# Patient Record
Sex: Male | Born: 1985
Health system: Southern US, Community
[De-identification: ages and names within clinical notes are randomized; demographics above are authoritative.]

## PROBLEM LIST (undated history)

## (undated) DIAGNOSIS — I442 Atrioventricular block, complete: Secondary | ICD-10-CM

## (undated) DIAGNOSIS — Q211 Atrial septal defect: Secondary | ICD-10-CM

## (undated) DIAGNOSIS — Q210 Ventricular septal defect: Secondary | ICD-10-CM

## (undated) DIAGNOSIS — D702 Other drug-induced agranulocytosis: Secondary | ICD-10-CM

## (undated) DIAGNOSIS — I358 Other nonrheumatic aortic valve disorders: Secondary | ICD-10-CM

## (undated) DIAGNOSIS — N179 Acute kidney failure, unspecified: Secondary | ICD-10-CM

## (undated) DIAGNOSIS — Z9889 Other specified postprocedural states: Secondary | ICD-10-CM

## (undated) DIAGNOSIS — I330 Acute and subacute infective endocarditis: Secondary | ICD-10-CM

## (undated) DIAGNOSIS — I420 Dilated cardiomyopathy: Secondary | ICD-10-CM

## (undated) DIAGNOSIS — Z8774 Personal history of (corrected) congenital malformations of heart and circulatory system: Secondary | ICD-10-CM

## (undated) DIAGNOSIS — G060 Intracranial abscess and granuloma: Secondary | ICD-10-CM

## (undated) DIAGNOSIS — Z950 Presence of cardiac pacemaker: Secondary | ICD-10-CM

## (undated) DIAGNOSIS — I351 Nonrheumatic aortic (valve) insufficiency: Secondary | ICD-10-CM

## (undated) DIAGNOSIS — Q2112 Patent foramen ovale: Secondary | ICD-10-CM

## (undated) DIAGNOSIS — I38 Endocarditis, valve unspecified: Secondary | ICD-10-CM

## (undated) DIAGNOSIS — T826XXA Infection and inflammatory reaction due to cardiac valve prosthesis, initial encounter: Secondary | ICD-10-CM

## (undated) DIAGNOSIS — G039 Meningitis, unspecified: Secondary | ICD-10-CM

## (undated) DIAGNOSIS — E669 Obesity, unspecified: Secondary | ICD-10-CM

## (undated) DIAGNOSIS — Z954 Presence of other heart-valve replacement: Secondary | ICD-10-CM

## (undated) DIAGNOSIS — S7292XA Unspecified fracture of left femur, initial encounter for closed fracture: Secondary | ICD-10-CM

## (undated) DIAGNOSIS — D696 Thrombocytopenia, unspecified: Secondary | ICD-10-CM

## (undated) DIAGNOSIS — Z720 Tobacco use: Secondary | ICD-10-CM

## (undated) DIAGNOSIS — I071 Rheumatic tricuspid insufficiency: Secondary | ICD-10-CM

## (undated) HISTORY — DX: Other nonrheumatic aortic valve disorders: I35.8

## (undated) HISTORY — DX: Endocarditis, valve unspecified: I38

## (undated) HISTORY — DX: Dilated cardiomyopathy: I42.0

## (undated) HISTORY — DX: Acute and subacute infective endocarditis: I33.0

## (undated) HISTORY — DX: Nonrheumatic aortic (valve) insufficiency: I35.1

## (undated) HISTORY — DX: Intracranial abscess and granuloma: G06.0

## (undated) HISTORY — DX: Atrioventricular block, complete: I44.2

## (undated) HISTORY — DX: Other drug-induced agranulocytosis: D70.2

## (undated) HISTORY — DX: Infection and inflammatory reaction due to cardiac valve prosthesis, initial encounter: T82.6XXA

---
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
## 2008-01-15 ENCOUNTER — Emergency Department (HOSPITAL_COMMUNITY): Admission: EM | Admit: 2008-01-15 | Discharge: 2008-01-15 | Payer: Self-pay | Admitting: Emergency Medicine

## 2013-12-13 DIAGNOSIS — D696 Thrombocytopenia, unspecified: Secondary | ICD-10-CM

## 2013-12-13 HISTORY — DX: Thrombocytopenia, unspecified: D69.6

## 2013-12-23 ENCOUNTER — Encounter (HOSPITAL_BASED_OUTPATIENT_CLINIC_OR_DEPARTMENT_OTHER): Payer: Self-pay

## 2013-12-23 ENCOUNTER — Inpatient Hospital Stay (HOSPITAL_BASED_OUTPATIENT_CLINIC_OR_DEPARTMENT_OTHER)
Admission: EM | Admit: 2013-12-23 | Discharge: 2014-01-07 | DRG: 853 | Disposition: A | Discharge status: Home Health | Payer: BC Managed Care – PPO | Attending: Cardiothoracic Surgery | Admitting: Cardiothoracic Surgery

## 2013-12-23 ENCOUNTER — Emergency Department (HOSPITAL_BASED_OUTPATIENT_CLINIC_OR_DEPARTMENT_OTHER): Payer: BC Managed Care – PPO

## 2013-12-23 DIAGNOSIS — R0600 Dyspnea, unspecified: Secondary | ICD-10-CM | POA: Diagnosis not present

## 2013-12-23 DIAGNOSIS — F40240 Claustrophobia: Secondary | ICD-10-CM | POA: Diagnosis present

## 2013-12-23 DIAGNOSIS — J9601 Acute respiratory failure with hypoxia: Secondary | ICD-10-CM | POA: Diagnosis not present

## 2013-12-23 DIAGNOSIS — Z4659 Encounter for fitting and adjustment of other gastrointestinal appliance and device: Secondary | ICD-10-CM

## 2013-12-23 DIAGNOSIS — R21 Rash and other nonspecific skin eruption: Secondary | ICD-10-CM | POA: Diagnosis present

## 2013-12-23 DIAGNOSIS — Q211 Atrial septal defect: Secondary | ICD-10-CM | POA: Diagnosis not present

## 2013-12-23 DIAGNOSIS — J189 Pneumonia, unspecified organism: Secondary | ICD-10-CM

## 2013-12-23 DIAGNOSIS — I440 Atrioventricular block, first degree: Secondary | ICD-10-CM | POA: Diagnosis present

## 2013-12-23 DIAGNOSIS — I351 Nonrheumatic aortic (valve) insufficiency: Secondary | ICD-10-CM | POA: Diagnosis present

## 2013-12-23 DIAGNOSIS — D62 Acute posthemorrhagic anemia: Secondary | ICD-10-CM | POA: Diagnosis not present

## 2013-12-23 DIAGNOSIS — G039 Meningitis, unspecified: Secondary | ICD-10-CM

## 2013-12-23 DIAGNOSIS — A419 Sepsis, unspecified organism: Secondary | ICD-10-CM

## 2013-12-23 DIAGNOSIS — R51 Headache: Secondary | ICD-10-CM

## 2013-12-23 DIAGNOSIS — R451 Restlessness and agitation: Secondary | ICD-10-CM | POA: Diagnosis not present

## 2013-12-23 DIAGNOSIS — E669 Obesity, unspecified: Secondary | ICD-10-CM | POA: Diagnosis present

## 2013-12-23 DIAGNOSIS — M009 Pyogenic arthritis, unspecified: Secondary | ICD-10-CM

## 2013-12-23 DIAGNOSIS — R740 Nonspecific elevation of levels of transaminase and lactic acid dehydrogenase [LDH]: Secondary | ICD-10-CM | POA: Diagnosis present

## 2013-12-23 DIAGNOSIS — Z7982 Long term (current) use of aspirin: Secondary | ICD-10-CM

## 2013-12-23 DIAGNOSIS — F17290 Nicotine dependence, other tobacco product, uncomplicated: Secondary | ICD-10-CM | POA: Diagnosis present

## 2013-12-23 DIAGNOSIS — A401 Sepsis due to streptococcus, group B: Principal | ICD-10-CM

## 2013-12-23 DIAGNOSIS — Z950 Presence of cardiac pacemaker: Secondary | ICD-10-CM

## 2013-12-23 DIAGNOSIS — R0602 Shortness of breath: Secondary | ICD-10-CM

## 2013-12-23 DIAGNOSIS — Z452 Encounter for adjustment and management of vascular access device: Secondary | ICD-10-CM

## 2013-12-23 DIAGNOSIS — R945 Abnormal results of liver function studies: Secondary | ICD-10-CM

## 2013-12-23 DIAGNOSIS — Z9889 Other specified postprocedural states: Secondary | ICD-10-CM

## 2013-12-23 DIAGNOSIS — J9690 Respiratory failure, unspecified, unspecified whether with hypoxia or hypercapnia: Secondary | ICD-10-CM

## 2013-12-23 DIAGNOSIS — D696 Thrombocytopenia, unspecified: Secondary | ICD-10-CM | POA: Diagnosis present

## 2013-12-23 DIAGNOSIS — N170 Acute kidney failure with tubular necrosis: Secondary | ICD-10-CM | POA: Diagnosis present

## 2013-12-23 DIAGNOSIS — I5023 Acute on chronic systolic (congestive) heart failure: Secondary | ICD-10-CM | POA: Diagnosis present

## 2013-12-23 DIAGNOSIS — I358 Other nonrheumatic aortic valve disorders: Secondary | ICD-10-CM | POA: Diagnosis present

## 2013-12-23 DIAGNOSIS — R001 Bradycardia, unspecified: Secondary | ICD-10-CM | POA: Diagnosis not present

## 2013-12-23 DIAGNOSIS — R739 Hyperglycemia, unspecified: Secondary | ICD-10-CM | POA: Diagnosis not present

## 2013-12-23 DIAGNOSIS — N179 Acute kidney failure, unspecified: Secondary | ICD-10-CM

## 2013-12-23 DIAGNOSIS — D65 Disseminated intravascular coagulation [defibrination syndrome]: Secondary | ICD-10-CM | POA: Diagnosis present

## 2013-12-23 DIAGNOSIS — G002 Streptococcal meningitis: Secondary | ICD-10-CM | POA: Diagnosis present

## 2013-12-23 DIAGNOSIS — Z6831 Body mass index (BMI) 31.0-31.9, adult: Secondary | ICD-10-CM

## 2013-12-23 DIAGNOSIS — E860 Dehydration: Secondary | ICD-10-CM

## 2013-12-23 DIAGNOSIS — R7989 Other specified abnormal findings of blood chemistry: Secondary | ICD-10-CM

## 2013-12-23 DIAGNOSIS — G060 Intracranial abscess and granuloma: Secondary | ICD-10-CM | POA: Diagnosis present

## 2013-12-23 DIAGNOSIS — I082 Rheumatic disorders of both aortic and tricuspid valves: Secondary | ICD-10-CM | POA: Diagnosis present

## 2013-12-23 DIAGNOSIS — R519 Headache, unspecified: Secondary | ICD-10-CM

## 2013-12-23 DIAGNOSIS — I471 Supraventricular tachycardia: Secondary | ICD-10-CM | POA: Diagnosis not present

## 2013-12-23 DIAGNOSIS — Q231 Congenital insufficiency of aortic valve: Secondary | ICD-10-CM

## 2013-12-23 DIAGNOSIS — I39 Endocarditis and heart valve disorders in diseases classified elsewhere: Secondary | ICD-10-CM

## 2013-12-23 DIAGNOSIS — I330 Acute and subacute infective endocarditis: Secondary | ICD-10-CM | POA: Diagnosis present

## 2013-12-23 DIAGNOSIS — M25569 Pain in unspecified knee: Secondary | ICD-10-CM

## 2013-12-23 DIAGNOSIS — I509 Heart failure, unspecified: Secondary | ICD-10-CM

## 2013-12-23 DIAGNOSIS — I442 Atrioventricular block, complete: Secondary | ICD-10-CM

## 2013-12-23 DIAGNOSIS — J9600 Acute respiratory failure, unspecified whether with hypoxia or hypercapnia: Secondary | ICD-10-CM

## 2013-12-23 DIAGNOSIS — R6521 Severe sepsis with septic shock: Secondary | ICD-10-CM | POA: Diagnosis present

## 2013-12-23 DIAGNOSIS — R0902 Hypoxemia: Secondary | ICD-10-CM

## 2013-12-23 DIAGNOSIS — Z952 Presence of prosthetic heart valve: Secondary | ICD-10-CM

## 2013-12-23 DIAGNOSIS — D7289 Other specified disorders of white blood cells: Secondary | ICD-10-CM | POA: Diagnosis not present

## 2013-12-23 DIAGNOSIS — R600 Localized edema: Secondary | ICD-10-CM

## 2013-12-23 DIAGNOSIS — R0682 Tachypnea, not elsewhere classified: Secondary | ICD-10-CM

## 2013-12-23 DIAGNOSIS — I77810 Thoracic aortic ectasia: Secondary | ICD-10-CM

## 2013-12-23 DIAGNOSIS — Z9289 Personal history of other medical treatment: Secondary | ICD-10-CM

## 2013-12-23 DIAGNOSIS — I339 Acute and subacute endocarditis, unspecified: Secondary | ICD-10-CM

## 2013-12-23 DIAGNOSIS — B951 Streptococcus, group B, as the cause of diseases classified elsewhere: Secondary | ICD-10-CM | POA: Diagnosis not present

## 2013-12-23 DIAGNOSIS — G009 Bacterial meningitis, unspecified: Secondary | ICD-10-CM | POA: Diagnosis present

## 2013-12-23 HISTORY — DX: Other specified postprocedural states: Z98.890

## 2013-12-23 HISTORY — DX: Unspecified fracture of left femur, initial encounter for closed fracture: S72.92XA

## 2013-12-23 HISTORY — DX: Atrial septal defect: Q21.1

## 2013-12-23 HISTORY — DX: Endocarditis, valve unspecified: I38

## 2013-12-23 HISTORY — DX: Meningitis, unspecified: G03.9

## 2013-12-23 HISTORY — DX: Obesity, unspecified: E66.9

## 2013-12-23 HISTORY — DX: Patent foramen ovale: Q21.12

## 2013-12-23 HISTORY — DX: Tobacco use: Z72.0

## 2013-12-23 LAB — CBC WITH DIFFERENTIAL/PLATELET
BAND NEUTROPHILS: 12 % — AB (ref 0–10)
BASOS ABS: 0 10*3/uL (ref 0.0–0.1)
Basophils Relative: 0 % (ref 0–1)
EOS PCT: 0 % (ref 0–5)
Eosinophils Absolute: 0 10*3/uL (ref 0.0–0.7)
HCT: 46.3 % (ref 39.0–52.0)
HEMOGLOBIN: 16.4 g/dL (ref 13.0–17.0)
LYMPHS PCT: 3 % — AB (ref 12–46)
Lymphs Abs: 0.3 10*3/uL — ABNORMAL LOW (ref 0.7–4.0)
MCH: 29.5 pg (ref 26.0–34.0)
MCHC: 35.4 g/dL (ref 30.0–36.0)
MCV: 83.4 fL (ref 78.0–100.0)
MONOS PCT: 2 % — AB (ref 3–12)
Monocytes Absolute: 0.2 10*3/uL (ref 0.1–1.0)
NEUTROS ABS: 9.8 10*3/uL — AB (ref 1.7–7.7)
Neutrophils Relative %: 83 % — ABNORMAL HIGH (ref 43–77)
Platelets: 9 10*3/uL — CL (ref 150–400)
RBC: 5.55 MIL/uL (ref 4.22–5.81)
RDW: 12.9 % (ref 11.5–15.5)
Smear Review: DECREASED
WBC: 10.3 10*3/uL (ref 4.0–10.5)

## 2013-12-23 LAB — CSF CELL COUNT WITH DIFFERENTIAL
EOS CSF: 0 % (ref 0–1)
Eosinophils, CSF: 0 % (ref 0–1)
LYMPHS CSF: 10 % — AB (ref 40–80)
Lymphs, CSF: 4 % — ABNORMAL LOW (ref 40–80)
MONOCYTE-MACROPHAGE-SPINAL FLUID: 8 % — AB (ref 15–45)
Monocyte-Macrophage-Spinal Fluid: 3 % — ABNORMAL LOW (ref 15–45)
OTHER CELLS CSF: 0
Other Cells, CSF: 0
RBC COUNT CSF: 245 /mm3 — AB
RBC COUNT CSF: 250 /mm3 — AB
Segmented Neutrophils-CSF: 82 % — ABNORMAL HIGH (ref 0–6)
Segmented Neutrophils-CSF: 93 % — ABNORMAL HIGH (ref 0–6)
Tube #: 1
Tube #: 4
WBC CSF: 2230 /mm3 — AB (ref 0–5)
WBC CSF: 2750 /mm3 — AB (ref 0–5)

## 2013-12-23 LAB — BASIC METABOLIC PANEL
ANION GAP: 14 (ref 5–15)
BUN: 34 mg/dL — ABNORMAL HIGH (ref 6–23)
CHLORIDE: 103 meq/L (ref 96–112)
CO2: 23 mEq/L (ref 19–32)
Calcium: 7.9 mg/dL — ABNORMAL LOW (ref 8.4–10.5)
Creatinine, Ser: 1.6 mg/dL — ABNORMAL HIGH (ref 0.50–1.35)
GFR calc Af Amer: 67 mL/min — ABNORMAL LOW (ref >90)
GFR calc non Af Amer: 58 mL/min — ABNORMAL LOW (ref >90)
Glucose, Bld: 151 mg/dL — ABNORMAL HIGH (ref 70–99)
POTASSIUM: 3.7 meq/L (ref 3.7–5.3)
Sodium: 140 mEq/L (ref 137–147)

## 2013-12-23 LAB — COMPREHENSIVE METABOLIC PANEL
ALK PHOS: 193 U/L — AB (ref 39–117)
ALT: 49 U/L (ref 0–53)
AST: 52 U/L — AB (ref 0–37)
Albumin: 3 g/dL — ABNORMAL LOW (ref 3.5–5.2)
Anion gap: 19 — ABNORMAL HIGH (ref 5–15)
BUN: 33 mg/dL — AB (ref 6–23)
CALCIUM: 9 mg/dL (ref 8.4–10.5)
CO2: 22 meq/L (ref 19–32)
CREATININE: 1.9 mg/dL — AB (ref 0.50–1.35)
Chloride: 100 mEq/L (ref 96–112)
GFR, EST AFRICAN AMERICAN: 55 mL/min — AB (ref >90)
GFR, EST NON AFRICAN AMERICAN: 47 mL/min — AB (ref >90)
GLUCOSE: 189 mg/dL — AB (ref 70–99)
POTASSIUM: 3.7 meq/L (ref 3.7–5.3)
Sodium: 141 mEq/L (ref 137–147)
TOTAL PROTEIN: 6.2 g/dL (ref 6.0–8.3)
Total Bilirubin: 6.3 mg/dL — ABNORMAL HIGH (ref 0.3–1.2)

## 2013-12-23 LAB — CK TOTAL AND CKMB (NOT AT ARMC)
CK, MB: <1 ng/mL (ref 0.3–4.0)
Total CK: 66 U/L (ref 7–232)

## 2013-12-23 LAB — TROPONIN I
Troponin I: <0.3 ng/mL (ref <0.30)
Troponin I: <0.3 ng/mL (ref <0.30)

## 2013-12-23 LAB — PROTEIN AND GLUCOSE, CSF
Glucose, CSF: 34 mg/dL — ABNORMAL LOW (ref 43–76)
Total  Protein, CSF: 164 mg/dL — ABNORMAL HIGH (ref 15–45)

## 2013-12-23 LAB — MRSA PCR SCREENING: MRSA by PCR: NEGATIVE

## 2013-12-23 MED ORDER — VANCOMYCIN HCL 10 G IV SOLR
1500.0000 mg | INTRAVENOUS | Status: AC
  Administered 2013-12-23: 1500 mg via INTRAVENOUS
  Filled 2013-12-23: qty 1500

## 2013-12-23 MED ORDER — METOCLOPRAMIDE HCL 5 MG/ML IJ SOLN
10.0000 mg | Freq: Once | INTRAMUSCULAR | Status: AC
  Administered 2013-12-23: 10 mg via INTRAVENOUS
  Filled 2013-12-23: qty 2

## 2013-12-23 MED ORDER — DIPHENHYDRAMINE HCL 50 MG/ML IJ SOLN
25.0000 mg | Freq: Once | INTRAMUSCULAR | Status: AC
  Administered 2013-12-23: 25 mg via INTRAVENOUS
  Filled 2013-12-23: qty 1

## 2013-12-23 MED ORDER — DEXAMETHASONE SODIUM PHOSPHATE 10 MG/ML IJ SOLN
10.0000 mg | Freq: Four times a day (QID) | INTRAMUSCULAR | Status: DC
  Administered 2013-12-24 (×2): 10 mg via INTRAVENOUS
  Filled 2013-12-23 (×9): qty 1

## 2013-12-23 MED ORDER — ACETAMINOPHEN 325 MG PO TABS
650.0000 mg | ORAL_TABLET | Freq: Four times a day (QID) | ORAL | Status: DC | PRN
  Administered 2013-12-26 – 2013-12-27 (×3): 650 mg via ORAL
  Filled 2013-12-23 (×4): qty 2

## 2013-12-23 MED ORDER — PROMETHAZINE HCL 25 MG/ML IJ SOLN
25.0000 mg | Freq: Once | INTRAMUSCULAR | Status: AC
  Administered 2013-12-23: 25 mg via INTRAVENOUS
  Filled 2013-12-23: qty 1

## 2013-12-23 MED ORDER — ONDANSETRON HCL 4 MG/2ML IJ SOLN
4.0000 mg | Freq: Once | INTRAMUSCULAR | Status: AC
  Administered 2013-12-23: 4 mg via INTRAVENOUS
  Filled 2013-12-23: qty 2

## 2013-12-23 MED ORDER — KETOROLAC TROMETHAMINE 30 MG/ML IJ SOLN
30.0000 mg | Freq: Once | INTRAMUSCULAR | Status: AC
Start: 2013-12-23 — End: 2013-12-23
  Administered 2013-12-23: 30 mg via INTRAVENOUS
  Filled 2013-12-23: qty 1

## 2013-12-23 MED ORDER — SODIUM CHLORIDE 0.9 % IV BOLUS (SEPSIS)
1000.0000 mL | Freq: Once | INTRAVENOUS | Status: AC
  Administered 2013-12-23: 1000 mL via INTRAVENOUS

## 2013-12-23 MED ORDER — CEFTRIAXONE SODIUM 2 G IJ SOLR
INTRAMUSCULAR | Status: AC
  Filled 2013-12-23: qty 2

## 2013-12-23 MED ORDER — FENTANYL CITRATE 0.05 MG/ML IJ SOLN
50.0000 ug | Freq: Once | INTRAMUSCULAR | Status: AC
  Administered 2013-12-23: 50 ug via INTRAVENOUS
  Filled 2013-12-23: qty 2

## 2013-12-23 MED ORDER — ENOXAPARIN SODIUM 40 MG/0.4ML ~~LOC~~ SOLN: 40.0000 mg | SUBCUTANEOUS | Status: DC

## 2013-12-23 MED ORDER — MAGNESIUM SULFATE 2 GM/50ML IV SOLN
2.0000 g | Freq: Once | INTRAVENOUS | Status: AC
  Administered 2013-12-23: 2 g via INTRAVENOUS
  Filled 2013-12-23: qty 50

## 2013-12-23 MED ORDER — CEFTRIAXONE SODIUM IN DEXTROSE 40 MG/ML IV SOLN
2.0000 g | Freq: Two times a day (BID) | INTRAVENOUS | Status: DC
  Administered 2013-12-24 – 2013-12-29 (×11): 2 g via INTRAVENOUS
  Filled 2013-12-23 (×12): qty 50

## 2013-12-23 MED ORDER — ONDANSETRON HCL 4 MG/2ML IJ SOLN
4.0000 mg | Freq: Four times a day (QID) | INTRAMUSCULAR | Status: DC | PRN
  Administered 2013-12-26 – 2013-12-28 (×3): 4 mg via INTRAVENOUS
  Filled 2013-12-23 (×5): qty 2

## 2013-12-23 MED ORDER — DEXTROSE 5 % IV SOLN
10.0000 mg/kg | Freq: Three times a day (TID) | INTRAVENOUS | Status: DC
  Administered 2013-12-23 – 2013-12-24 (×2): 615 mg via INTRAVENOUS
  Filled 2013-12-23 (×4): qty 12.3

## 2013-12-23 MED ORDER — SODIUM CHLORIDE 0.9 % IV SOLN: Freq: Once | INTRAVENOUS | Status: DC

## 2013-12-23 MED ORDER — ONDANSETRON HCL 4 MG PO TABS: 4.0000 mg | ORAL_TABLET | Freq: Four times a day (QID) | ORAL | Status: DC | PRN

## 2013-12-23 MED ORDER — SODIUM CHLORIDE 0.9 % IV SOLN
INTRAVENOUS | Status: DC
  Administered 2013-12-24 – 2013-12-25 (×3): via INTRAVENOUS
  Administered 2013-12-27: 1000 mL via INTRAVENOUS
  Administered 2013-12-27 – 2013-12-28 (×2): via INTRAVENOUS
  Administered 2013-12-29: 1000 mL via INTRAVENOUS

## 2013-12-23 MED ORDER — DEXTROSE 5 % IV SOLN
2.0000 g | Freq: Once | INTRAVENOUS | Status: AC
  Administered 2013-12-23: 2 g via INTRAVENOUS

## 2013-12-23 MED ORDER — VANCOMYCIN HCL 10 G IV SOLR
1250.0000 mg | Freq: Two times a day (BID) | INTRAVENOUS | Status: DC
  Administered 2013-12-24: 1250 mg via INTRAVENOUS
  Filled 2013-12-23 (×2): qty 1250

## 2013-12-23 MED ORDER — ZOLPIDEM TARTRATE 5 MG PO TABS: 5.0000 mg | ORAL_TABLET | Freq: Once | ORAL | Status: DC

## 2013-12-23 MED ORDER — LIDOCAINE HCL 2 % IJ SOLN
10.0000 mL | Freq: Once | INTRAMUSCULAR | Status: AC
  Administered 2013-12-23: 200 mg
  Filled 2013-12-23: qty 20

## 2013-12-23 MED ORDER — ENOXAPARIN SODIUM 40 MG/0.4ML ~~LOC~~ SOLN
40.0000 mg | SUBCUTANEOUS | Status: DC
  Filled 2013-12-23: qty 0.4

## 2013-12-23 MED ORDER — SODIUM CHLORIDE 0.9 % IV SOLN
Freq: Once | INTRAVENOUS | Status: AC
  Administered 2013-12-23: 14:00:00 via INTRAVENOUS

## 2013-12-23 MED ORDER — ZOLPIDEM TARTRATE 5 MG PO TABS
5.0000 mg | ORAL_TABLET | Freq: Once | ORAL | Status: AC
  Administered 2013-12-23: 5 mg via ORAL
  Filled 2013-12-23: qty 1

## 2013-12-23 MED ORDER — LEVALBUTEROL HCL 0.63 MG/3ML IN NEBU: 0.6300 mg | INHALATION_SOLUTION | Freq: Four times a day (QID) | RESPIRATORY_TRACT | Status: DC | PRN

## 2013-12-23 MED ORDER — ACETAMINOPHEN 650 MG RE SUPP: 650.0000 mg | Freq: Four times a day (QID) | RECTAL | Status: DC | PRN

## 2013-12-23 NOTE — ED Notes (Signed)
Attempted to call report; RN unable to take report at this time.

## 2013-12-23 NOTE — Progress Notes (Signed)
PENDING ACCEPTANCE TRANFER NOTE:  Call received from:    Dr. Anitra Lauth  REASON FOR REQUESTING TRANSFER:    Severe thrombocytopenia and acute renal failure.  HPI:   28 year old healthy male was seen recently in urgent care for flu-like symptoms, came back with nausea/vomiting.he was seen by his primary care physician and he mentioned neck stiffness, headache and subjective fever so he was sent back to the ED for further evaluation. Had LP done, results pending, his labs came back with creatinine of 1.9 and platelets of 9. Accepted stepdown, because of neck stiffness started on high dose of Rocephin.  PLAN:  According to telephone report, this patient was accepted for transfer to [facility],   Under TRH team:  MCadmits,  I have requested an order be written to call Flow Manager at [phone] upon patient arrival to the floor for final physician assignment who will do the admission and give admitting orders.  SIGNED: Clint Lipps, MD [facility]  12/23/2013, 12:54 PM

## 2013-12-23 NOTE — ED Provider Notes (Addendum)
CSN: [id number]     Arrival date & time 12/23/13  1102 History   First MD Initiated Contact with Patient 12/23/13 1117     Chief Complaint  Patient presents with  . Emesis     (Consider location/radiation/quality/duration/timing/severity/associated sxs/prior Treatment) Patient is a 28 y.o. male presenting with vomiting. The history is provided by the patient and a significant other.  Emesis Severity:  Severe Duration:  5 days Timing:  Constant Number of daily episodes:  Numerous Quality:  Stomach contents Progression:  Unchanged Chronicity:  New Recent urination:  Decreased Relieved by:  Nothing Worsened by:  Food smell and liquids Associated symptoms: chills, fever and headaches   Associated symptoms: no abdominal pain, no cough, no diarrhea, no myalgias and no URI   Associated symptoms comment:  Stared with headache over the temples on sat with vomiting and was seen at urgent care on Sunday and dx with the flu.  Sx worsened and he was unable to hold anything down so went to the ER on Monday and dx with elevated LFT's and thrombocytopenia.  He had neg CT of abd and d/ced home.  Today went to PCP and sent here for further eval Risk factors: no alcohol use and no sick contacts     History reviewed. No pertinent past medical history. History reviewed. No pertinent past surgical history. No family history on file. History  Substance Use Topics  . Smoking status: Never Smoker   . Smokeless tobacco: Current User  . Alcohol Use: Yes    Review of Systems  Constitutional: Positive for chills.  Gastrointestinal: Positive for vomiting. Negative for abdominal pain and diarrhea.  Musculoskeletal: Negative for myalgias.  Neurological: Positive for headaches.  All other systems reviewed and are negative.     Allergies  Review of patient's allergies indicates no known allergies.  Home Medications   Prior to Admission medications   Not on File   BP 108/50 mmHg  Pulse 135   Temp(Src) 97.5 F (36.4 C) (Oral)  Resp 20  Ht 5\' 5"  (1.651 m)  Wt 200 lb (90.719 kg)  BMI 33.28 kg/m2  SpO2 96% Physical Exam  Constitutional: He is oriented to person, place, and time. He appears well-developed and well-nourished. No distress.  HENT:  Head: Normocephalic and atraumatic.  Mouth/Throat: Oropharynx is clear and moist. Mucous membranes are dry.  Eyes: Conjunctivae and EOM are normal. Pupils are equal, round, and reactive to light.  photophobia  Neck: Spinous process tenderness and muscular tenderness present. Decreased range of motion present.  Pain with attempting to flex the neck  Cardiovascular: Regular rhythm and intact distal pulses.  Tachycardia present.   No murmur heard. Pulmonary/Chest: Effort normal and breath sounds normal. No respiratory distress. He has no wheezes. He has no rales.  Abdominal: Soft. He exhibits no distension. There is no tenderness. There is no rebound and no guarding.  Musculoskeletal: He exhibits tenderness. He exhibits no edema.  Small circular area that is ecchymotic on the right calf that is tender to touch.  Does not feel like a discrete nodule at this time and not firm.  Neurological: He is alert and oriented to person, place, and time.  Skin: Skin is warm and dry. No rash noted. No erythema.  Psychiatric: He has a normal mood and affect. His behavior is normal.  Nursing note and vitals reviewed.   ED Course  LUMBAR PUNCTURE Date/Time: 12/23/2013 12:56 PM Performed by: Gwyneth Sprout Authorized by: Gwyneth Sprout Consent: Verbal consent obtained. Written  consent obtained. Risks and benefits: risks, benefits and alternatives were discussed Consent given by: patient and parent Patient understanding: patient states understanding of the procedure being performed Patient consent: the patient's understanding of the procedure matches consent given Procedure consent: procedure consent matches procedure scheduled Relevant  documents: relevant documents present and verified Site marked: the operative site was marked Imaging studies: imaging studies available Patient identity confirmed: verbally with patient and arm band Time out: Immediately prior to procedure a "time out" was called to verify the correct patient, procedure, equipment, support staff and site/side marked as required. Indications: evaluation for infection Anesthesia: local infiltration Local anesthetic: lidocaine 2% without epinephrine Anesthetic total: 5 ml Patient sedated: no Preparation: Patient was prepped and draped in the usual sterile fashion. Lumbar space: L4-L5 interspace Patient's position: sitting Needle gauge: 20 Needle type: diamond point Number of attempts: 1 Fluid appearance: cloudy Tubes of fluid: 4 Total volume: 4 ml Post-procedure: site cleaned and adhesive bandage applied Patient tolerance: Patient tolerated the procedure well with no immediate complications   (including critical care time) Labs Review Labs Reviewed  CBC WITH DIFFERENTIAL - Abnormal; Notable for the following:    Platelets 9 (*)    Neutrophils Relative % 83 (*)    Lymphocytes Relative 3 (*)    Monocytes Relative 2 (*)    Band Neutrophils 12 (*)    Neutro Abs 9.8 (*)    Lymphs Abs 0.3 (*)    All other components within normal limits  COMPREHENSIVE METABOLIC PANEL - Abnormal; Notable for the following:    Glucose, Bld 189 (*)    BUN 33 (*)    Creatinine, Ser 1.90 (*)    Albumin 3.0 (*)    AST 52 (*)    Alkaline Phosphatase 193 (*)    Total Bilirubin 6.3 (*)    GFR calc non Af Amer 47 (*)    GFR calc Af Amer 55 (*)    Anion gap 19 (*)    All other components within normal limits  CSF CELL COUNT WITH DIFFERENTIAL - Abnormal; Notable for the following:    Color, CSF STRAW (*)    Appearance, CSF CLOUDY (*)    RBC Count, CSF 250 (*)    WBC, CSF 2230 (*)    All other components within normal limits  CSF CELL COUNT WITH DIFFERENTIAL -  Abnormal; Notable for the following:    Color, CSF STRAW (*)    Appearance, CSF CLOUDY (*)    RBC Count, CSF 245 (*)    WBC, CSF 2750 (*)    All other components within normal limits  PROTEIN AND GLUCOSE, CSF - Abnormal; Notable for the following:    Glucose, CSF 34 (*)    Total  Protein, CSF 164 (*)    All other components within normal limits  CSF CULTURE  PATHOLOGIST SMEAR REVIEW  BASIC METABOLIC PANEL    Imaging Review Ct Head Wo Contrast  12/23/2013   CLINICAL DATA:  5 day history of bilateral temporal region headaches with neck stiffness and vomiting  EXAM: CT HEAD WITHOUT CONTRAST  TECHNIQUE: Contiguous axial images were obtained from the base of the skull through the vertex without intravenous contrast.  COMPARISON:  None.  FINDINGS: The ventricles are normal in size and configuration. There is no mass, hemorrhage, extra-axial fluid collection, or midline shift. The gray-white compartments are normal. No appreciable acute infarct. The bony calvarium appears intact. The mastoid air cells are clear. There is paranasal sinus disease in the  left frontal sinus region.  IMPRESSION: Left frontal sinusitis. No intracranial lesion. No intracranial mass, hemorrhage, or extra-axial fluid. No gray old -white compartment lesions appreciated.   Electronically Signed   By: Bretta BangWilliam  Woodruff M.D.   On: 12/23/2013 11:52     EKG Interpretation None      MDM   Final diagnoses:  Headache  Thrombocytopenia  Acute renal failure, unspecified acute renal failure type  Dehydration   Patient with symptoms for 5 days now concerning for meningitis. He has a severe headache, vomiting, fever, neck stiffness. Patient initially seen and diagnosed with the flu and then seen at a ER 2 days ago and found to have thrombocytopenia, mild elevation of LFTs and a CT scan of his abdomen which was normal. He saw his regular doctor today and sent here for further evaluation for possible viral meningitis. Patient  denies any tick contacts but states he does have dogs. He has severe tenderness in his neck with range of motion but no other focal neurologic findings on exam. He appears dehydrated and is tachycardic. Every time he attempts to eat or drink and causes him to vomit. Patient is otherwise healthy and takes no medications regularly.  CBC, CMP, head CT pending. Patient given IV fluids, Reglan, Benadryl and magnesium for headache.  Pt will need LP to r/o meningitis  12:39 PM CT neg.  Labs signifincant for platelets of 9 from 86 2 days ago with Howell-Jolly bodieswith normal hemoglobin and white blood cell count. CMP significant for new acute renal failure with a creatinine of 1.9 from 1.12 days ago.  LP was performed prior to the platelet count returning with CSF appearing yellow.  Patient's tachycardia improved he fluids from 140 to 115.  CSF studies sent will admit patient for further care  3:31 PM LP results consistent with meningitis.  Differential pending.  CRITICAL CARE Performed by: Gwyneth SproutPLUNKETT,Fynley Chrystal Total critical care time: 30 Critical care time was exclusive of separately billable procedures and treating other patients. Critical care was necessary to treat or prevent imminent or life-threatening deterioration. Critical care was time spent personally by me on the following activities: development of treatment plan with patient and/or surrogate as well as nursing, discussions with consultants, evaluation of patient's response to treatment, examination of patient, obtaining history from patient or surrogate, ordering and performing treatments and interventions, ordering and review of laboratory studies, ordering and review of radiographic studies, pulse oximetry and re-evaluation of patient's condition.   Gwyneth SproutWhitney Siddhartha Hoback, MD 12/23/13 1257  Gwyneth SproutWhitney Edmund Holcomb, MD 12/23/13 2114

## 2013-12-23 NOTE — H&P (Addendum)
[facility] History and Physical  Elliot CousinHayden Ector ONG:[id number]:[id number] DOB: 08/14/1985 DOA: 12/23/2013  Referring physician:   PCP: No primary care provider on file.   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 The patient's symptoms started about 5 days ago when he developed chills, rigors,, headache, fever, neck stiffness, nausea vomiting starting on Saturday. Headache mostly temporal in  Location.patient also developed sensitivity to light and sound. He was seen at urgent care on Sunday and diagnosed with the flu. Symptoms worsened because of persistent nausea. He been to the ED on Monday and diagnosed with abnormal liver function and thrombocytopenia. Had a CT scan of the abdomen which was negative. Subsequently followed up with his PCP was sent into the ER for suspected viral meningitis.lumbar puncture shows significantly elevated CSF WBC count. Patient is tachycardic in the 140s.platelet count of 9, creatinine 1.9      Review of Systems: negative for the following  Constitutional: Denies fever, chills, diaphoresis, appetite change and fatigue.  HEENT: Denies photophobia, eye pain, redness, hearing loss, ear pain, congestion, sore throat, rhinorrhea, sneezing, mouth sores, trouble swallowing, neck pain, neck stiffness and tinnitus.  Respiratory: Denies SOB, DOE, cough, chest tightness, and wheezing.  Cardiovascular: Denies chest pain, palpitations and leg swelling.  Gastrointestinal: positive for nausea, vomiting, abdominal pain, diarrhea, constipation, blood in stool and abdominal distention.  Genitourinary: Denies dysuria, urgency, frequency, hematuria, flank pain and difficulty urinating.  Musculoskeletal: Denies myalgias, back pain, joint swelling, arthralgias and gait problem.  Skin: Denies pallor, rash and wound.  Neurological: positive for dizziness, seizures, syncope, weakness,  light-headedness, numbness and positive for headaches.  Hematological: Denies adenopathy. Easy bruising, personal or family bleeding history  Psychiatric/Behavioral: Denies suicidal ideation, mood changes, confusion, nervousness, sleep disturbance and agitation       History reviewed. No pertinent past medical history.   History reviewed. No pertinent past surgical history.    Social History:  reports that he has never smoked. He uses smokeless tobacco. He reports that he drinks alcohol. He reports that he does not use illicit drugs.   No Known Allergies  No family history on file. -this was reviewed and found to be negative  Prior to Admission medications   Medication Sig Start Date End Date Taking? Authorizing Provider  HYDROcodone-acetaminophen (NORCO/VICODIN) 5-325 MG per tablet Take 2 tablets by mouth every 4 (four) hours as needed. As needed for pain 12/20/13  Yes Historical Provider, MD  ondansetron (ZOFRAN-ODT) 4 MG disintegrating tablet Take 4 mg by mouth every 6 (six) hours. 12/21/13  Yes Historical Provider, MD  prochlorperazine (COMPAZINE) 10 MG tablet Take 1 tablet by mouth 3 (three) times daily. 12/20/13  Yes Historical Provider, MD  TAMIFLU 75 MG capsule Take 75 mg by mouth 2 (two) times daily. 12/20/13  Yes Historical Provider, MD     Physical Exam: Filed Vitals:   12/23/13 1358 12/23/13 1450 12/23/13 1516 12/23/13 1605  BP: 120/59 111/46 98/33 109/82  Pulse: 121 124 127 126  Temp:  99.6 F (37.6 C)  98.7 F (37.1 C)  TempSrc:  Oral  Oral  Resp: 32 34 30 30  Height:      Weight:      SpO2: 95% 95% 94% 98%     Constitutional: Vital signs reviewed. Patient is a well-developed and well-nourished in no acute distress and cooperative with exam. Alert and oriented x3.  Head: Normocephalic and atraumatic  Ear:  TM normal bilaterally  Mouth: no erythema or exudates, MMM  Eyes: Conjunctivae and EOM are normal. Pupils are equal, round, and reactive to light.   photophobia  Neck: Spinous process tenderness and muscular tenderness present. Decreased range of motion present.  Pain with attempting to flex the neck  Cardiovascular: Regular rhythm and intact distal pulses. Tachycardia present.  No murmur heard. Pulmonary/Chest: Effort normal and breath sounds normal. No respiratory distress. He has no wheezes. He has no rales.  Abdominal: Soft. He exhibits no distension. There is no tenderness. There is no rebound and no guarding.  Musculoskeletal: He exhibits tenderness. He exhibits no edema.  Small circular area that is ecchymotic on the right calf that is tender to touch. Does not feel like a discrete nodule at this time and not firm. Ext: no edema and no cyanosis, pulses palpable bilaterally (DP and PT)  Hematology: no cervical, inginal, or axillary adenopathy.  Neurological: A&O x3, Strenght is normal and symmetric bilaterally, cranial nerve II-XII are grossly intact, no focal motor deficit, sensory intact to light touch bilaterally.  Skin: Warm, dry and intact. No rash, cyanosis, or clubbing.  Psychiatric: Normal mood and affect. speech and behavior is normal. Judgment and thought content normal. Cognition and memory are normal.       Labs on Admission:    Basic Metabolic Panel:  Recent Labs Lab 12/23/13 1125 12/23/13 1515  NA 141 140  K 3.7 3.7  CL 100 103  CO2 22 23  GLUCOSE 189* 151*  BUN 33* 34*  CREATININE 1.90* 1.60*  CALCIUM 9.0 7.9*   Liver Function Tests:  Recent Labs Lab 12/23/13 1125  AST 52*  ALT 49  ALKPHOS 193*  BILITOT 6.3*  PROT 6.2  ALBUMIN 3.0*   No results for input(s): LIPASE, AMYLASE in the last 168 hours. No results for input(s): AMMONIA in the last 168 hours. CBC:  Recent Labs Lab 12/23/13 1125  WBC 10.3  NEUTROABS 9.8*  HGB 16.4  HCT 46.3  MCV 83.4  PLT 9*   Cardiac Enzymes: No results for input(s): CKTOTAL, CKMB, CKMBINDEX, TROPONINI in the last 168 hours.  BNP (last 3  results) No results for input(s): PROBNP in the last 8760 hours.    CBG: No results for input(s): GLUCAP in the last 168 hours.  Radiological Exams on Admission: Ct Head Wo Contrast  12/23/2013   CLINICAL DATA:  5 day history of bilateral temporal region headaches with neck stiffness and vomiting  EXAM: CT HEAD WITHOUT CONTRAST  TECHNIQUE: Contiguous axial images were obtained from the base of the skull through the vertex without intravenous contrast.  COMPARISON:  None.  FINDINGS: The ventricles are normal in size and configuration. There is no mass, hemorrhage, extra-axial fluid collection, or midline shift. The gray-white compartments are normal. No appreciable acute infarct. The bony calvarium appears intact. The mastoid air cells are clear. There is paranasal sinus disease in the left frontal sinus region.  IMPRESSION: Left frontal sinusitis. No intracranial lesion. No intracranial mass, hemorrhage, or extra-axial fluid. No gray old -white compartment lesions appreciated.   Electronically Signed   By: Bretta Bang M.D.   On: 12/23/2013 11:52    EKG: Independently reviewed.  Assessment/Plan Active Problems:   Thrombocytopenia   Meningitis due to bacterium   Meningitis, likely bacterial, Sepsis Blood culture 2, empiric antibiotics Status post lumbar puncture, Gram stain negative Follow CSF culture closely CSF WBC shows 2230 white blood cells, 93% neutrophils Discussed with Dr Luciana Axe., infectious disease He  will consult in the morning Started on empiric antibiotics namely vancomycin, Rocephin, acyclovir Check HIV antibody   Acute renal failure likely ATN, Patient will be hydrated aggressively with IV fluids  Thrombocytopenia Likely secondary to DIC We'll check DIC panel  Leg pain, with calf muscle tenderness Will obtain ultrasound of the right lower extremity to rule out hematoma in the setting of thrombocytopenia Vascular checks Check CK to rule out compartment  syndrome   Code Status:   full Family Communication: bedside Disposition Plan: admit   Time spent: 70 mins   [facility] Pager [phone]  If 7PM-7AM, please contact night-coverage [email or web address] Password [email or web address] 12/23/2013, 5:55 PM

## 2013-12-23 NOTE — ED Notes (Signed)
Pt reports nausea at this time.  Pt reports no change in pain level still 10/10.

## 2013-12-23 NOTE — ED Notes (Signed)
Report to Megan, RN.

## 2013-12-23 NOTE — ED Notes (Signed)
Lab at [facility] calls with critical lab values, dr. Anitra Lauth notified.  Tube #1 WBC 2230, RBC 250.  Tube #4 WBC 2750, RBC 245.

## 2013-12-23 NOTE — Progress Notes (Signed)
ANTIBIOTIC CONSULT NOTE - INITIAL  Pharmacy Consult:  Vancomycin / Rocephin / Acyclovir Indication:  Meningitis  No Known Allergies  Patient Measurements: Height: 5\' 5"  (165.1 cm) Weight: 200 lb (90.719 kg) IBW/kg (Calculated) : 61.5  Vital Signs: Temp: 98.7 F (37.1 C) (11/11 1605) Temp Source: Oral (11/11 1605) BP: 109/82 mmHg (11/11 1605) Pulse Rate: 126 (11/11 1605)  Labs:  Recent Labs  12/23/13 1125 12/23/13 1515  WBC 10.3  --   HGB 16.4  --   PLT 9*  --   CREATININE 1.90* 1.60*   Estimated Creatinine Clearance: 72.4 mL/min (by C-G formula based on Cr of 1.6). No results for input(s): VANCOTROUGH, VANCOPEAK, VANCORANDOM, GENTTROUGH, GENTPEAK, GENTRANDOM, TOBRATROUGH, TOBRAPEAK, TOBRARND, AMIKACINPEAK, AMIKACINTROU, AMIKACIN in the last 72 hours.   Microbiology: Recent Results (from the past 720 hour(s))  CSF culture     Status: None (Preliminary result)   Collection Time: 12/23/13 12:20 PM  Result Value Ref Range Status   Specimen Description CSF  Final   Special Requests NONE  Final   Gram Stain   Final    CYOSPIN NO WBC SEEN NO ORGANISMS SEEN Performed at [facility]    Culture PENDING  Incomplete   Report Status PENDING  Incomplete    Medical History: History reviewed. No pertinent past medical history.    Assessment: 75 YOM recently seen at Urgent Care for flu-like symptoms.  Patient then came to the ED due to inability to eat and was sent home.   He was seen again at PCP today and sent to the ED due to mention of neck stiffness, headache and fever.  Pharmacy consulted to dose vancomycin, ceftriaxone, and acyclovir for rule out meningitis.  His AKI is improving.  Noted patient received ceftriaxone 2gm IV around 1400 today.   Goal of Therapy:  Vancomycin trough level 15-20 mcg/ml   Plan:  - Vanc 1500mg  IV x 1, then 1250mg  IV Q12H - Rocephin 2gm IV Q12H - Acyclovir 615mg  IV Q8H - Monitor renal fxn, LP result, vanc trough as  indicated    Austen Wygant D. Laney Potash, PharmD, BCPS Pager:  [phone] 12/23/2013, 5:52 PM

## 2013-12-23 NOTE — ED Notes (Addendum)
Pt moved to room 14 for closer observation. Mother at bedside, pt and mother updated on plan of care. Pt to be admitted to [facility]. Pt requests lights dimmed, cloth placed over eyes per pt request, warm blanket given. Pt and mother informed of NPO status.

## 2013-12-23 NOTE — ED Notes (Signed)
Pt reports been vomiting and having headache since Saturday.  Spouse reports been to urgent care told it was flu then to ER and told stomach virus.  Pt reports headaches are severe.

## 2013-12-23 NOTE — ED Notes (Signed)
Charge RN from floor called and gave number to call report.  [facility] attempted to speak with receiving nurse and was unable too.

## 2013-12-23 NOTE — ED Notes (Signed)
2 IV attempts unsuccessful.  Pt moved to room 14.

## 2013-12-23 NOTE — ED Notes (Signed)
Report received from [place], [place] for lunch coverage.

## 2013-12-24 ENCOUNTER — Inpatient Hospital Stay (HOSPITAL_COMMUNITY): Payer: BC Managed Care – PPO

## 2013-12-24 DIAGNOSIS — R7989 Other specified abnormal findings of blood chemistry: Secondary | ICD-10-CM

## 2013-12-24 DIAGNOSIS — B951 Streptococcus, group B, as the cause of diseases classified elsewhere: Secondary | ICD-10-CM

## 2013-12-24 DIAGNOSIS — N179 Acute kidney failure, unspecified: Secondary | ICD-10-CM

## 2013-12-24 DIAGNOSIS — M79609 Pain in unspecified limb: Secondary | ICD-10-CM

## 2013-12-24 DIAGNOSIS — R945 Abnormal results of liver function studies: Secondary | ICD-10-CM

## 2013-12-24 DIAGNOSIS — M25561 Pain in right knee: Secondary | ICD-10-CM

## 2013-12-24 DIAGNOSIS — R51 Headache: Secondary | ICD-10-CM

## 2013-12-24 DIAGNOSIS — M25569 Pain in unspecified knee: Secondary | ICD-10-CM

## 2013-12-24 DIAGNOSIS — G002 Streptococcal meningitis: Secondary | ICD-10-CM

## 2013-12-24 DIAGNOSIS — R519 Headache, unspecified: Secondary | ICD-10-CM

## 2013-12-24 DIAGNOSIS — A419 Sepsis, unspecified organism: Secondary | ICD-10-CM

## 2013-12-24 DIAGNOSIS — A401 Sepsis due to streptococcus, group B: Principal | ICD-10-CM

## 2013-12-24 DIAGNOSIS — D696 Thrombocytopenia, unspecified: Secondary | ICD-10-CM

## 2013-12-24 DIAGNOSIS — E860 Dehydration: Secondary | ICD-10-CM

## 2013-12-24 LAB — COMPREHENSIVE METABOLIC PANEL
ALT: 37 U/L (ref 0–53)
ANION GAP: 15 (ref 5–15)
AST: 41 U/L — AB (ref 0–37)
Albumin: 2.3 g/dL — ABNORMAL LOW (ref 3.5–5.2)
Alkaline Phosphatase: 95 U/L (ref 39–117)
BILIRUBIN TOTAL: 4.4 mg/dL — AB (ref 0.3–1.2)
BUN: 44 mg/dL — AB (ref 6–23)
CHLORIDE: 105 meq/L (ref 96–112)
CO2: 20 mEq/L (ref 19–32)
CREATININE: 1.48 mg/dL — AB (ref 0.50–1.35)
Calcium: 7.6 mg/dL — ABNORMAL LOW (ref 8.4–10.5)
GFR calc Af Amer: 74 mL/min — ABNORMAL LOW (ref >90)
GFR calc non Af Amer: 64 mL/min — ABNORMAL LOW (ref >90)
GLUCOSE: 158 mg/dL — AB (ref 70–99)
Potassium: 4 mEq/L (ref 3.7–5.3)
Sodium: 140 mEq/L (ref 137–147)
Total Protein: 4.8 g/dL — ABNORMAL LOW (ref 6.0–8.3)

## 2013-12-24 LAB — CBC
HCT: 38.4 % — ABNORMAL LOW (ref 39.0–52.0)
Hemoglobin: 13.7 g/dL (ref 13.0–17.0)
MCH: 29.5 pg (ref 26.0–34.0)
MCHC: 35.7 g/dL (ref 30.0–36.0)
MCV: 82.6 fL (ref 78.0–100.0)
PLATELETS: 17 10*3/uL — AB (ref 150–400)
RBC: 4.65 MIL/uL (ref 4.22–5.81)
RDW: 13.3 % (ref 11.5–15.5)
WBC: 14.5 10*3/uL — ABNORMAL HIGH (ref 4.0–10.5)

## 2013-12-24 LAB — URINALYSIS, ROUTINE W REFLEX MICROSCOPIC
GLUCOSE, UA: NEGATIVE mg/dL
KETONES UR: 15 mg/dL — AB
Nitrite: POSITIVE — AB
PH: 5.5 (ref 5.0–8.0)
Protein, ur: 30 mg/dL — AB
Specific Gravity, Urine: 1.034 — ABNORMAL HIGH (ref 1.005–1.030)
Urobilinogen, UA: 2 mg/dL — ABNORMAL HIGH (ref 0.0–1.0)

## 2013-12-24 LAB — URINE MICROSCOPIC-ADD ON

## 2013-12-24 LAB — PATHOLOGIST SMEAR REVIEW

## 2013-12-24 LAB — DIC (DISSEMINATED INTRAVASCULAR COAGULATION)PANEL
D-Dimer, Quant: >20 ug/mL-FEU — ABNORMAL HIGH (ref 0.00–0.48)
Fibrinogen: 403 mg/dL (ref 204–475)
Prothrombin Time: 15.5 seconds — ABNORMAL HIGH (ref 11.6–15.2)
Smear Review: NONE SEEN
aPTT: 30 seconds (ref 24–37)

## 2013-12-24 LAB — HIV ANTIBODY (ROUTINE TESTING W REFLEX): HIV: NONREACTIVE

## 2013-12-24 LAB — TROPONIN I: Troponin I: <0.3 ng/mL (ref <0.30)

## 2013-12-24 LAB — DIC (DISSEMINATED INTRAVASCULAR COAGULATION) PANEL
INR: 1.22 (ref 0.00–1.49)
PLATELETS: 10 10*3/uL — AB (ref 150–400)

## 2013-12-24 LAB — ABO/RH: ABO/RH(D): A NEG

## 2013-12-24 MED ORDER — GADOBENATE DIMEGLUMINE 529 MG/ML IV SOLN
20.0000 mL | Freq: Once | INTRAVENOUS | Status: AC | PRN
  Administered 2013-12-24: 20 mL via INTRAVENOUS

## 2013-12-24 MED ORDER — ENOXAPARIN SODIUM 60 MG/0.6ML ~~LOC~~ SOLN
45.0000 mg | SUBCUTANEOUS | Status: DC
  Filled 2013-12-24: qty 0.6

## 2013-12-24 MED ORDER — LORAZEPAM 0.5 MG PO TABS
1.0000 mg | ORAL_TABLET | Freq: Once | ORAL | Status: AC | PRN
  Administered 2013-12-24: 1 mg via ORAL
  Filled 2013-12-24: qty 2

## 2013-12-24 NOTE — Progress Notes (Signed)
CRITICAL VALUE ALERT  Critical value received:  Spinal fluid (+) few group B strep  Date of notification:  12/24/13  Time of notification:  0745  Critical value read back:Yes.    Nurse who received alert:  Nicole Cella, RN  MD notified (1st page):  Dr Joseph Art  Time of first page:  [phone]  MD notified (2nd page):  Time of second page:  Responding MD:  Dr Joseph Art  Time MD responded:  [phone]

## 2013-12-24 NOTE — Consult Note (Signed)
[name]    Date of Admission:  12/23/2013  Date of Consult:  12/24/2013  Reason for Consult:group B streptococcal meningitis and sepsis Referring Physician: Dr. Sherral Hammers   HPI: [name] is an 28 y.o. 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 Last Saturday he developed chills Reiger's headache and fever. He ultimately developed nausea as well. He initially thought this was a migrainous headache and took Goody's powder with some relief of his symptoms. Reportedly symptoms worsened and he sought care  an urgent care and even [place] where he was given therapy for his symptoms as well as empiric therapy for possible influenza. He tells me that he was told by a physician there that the physician evaluating him did not believe that the patient had meningitis because he lacked a stiff neck and a rash)-- BOTH VERY INSENSITIVE EXAM FINDINGS FOR MENINGITIS!!). REGARDLESS THE PATIENT WORSENED OVER THE ENSUING DAYS WITH WORSENING HEADACHE AT WORK AND CONSTANT VOMITING HE WAS FEBRILE and then actually DID develop a stiff neck and a blotchy rash on his leg ultimately came to [place] and then transferred to [facility]  Admission labs in the ER were pertinent for leukocytosis [place] a [place] [address] with 83% pmns,  platelets of 9,000, acute renal failure with a creatinine of 1.90, elevated ast, alk phosph. He had LP which showed:  2230 white blood cells in tube #1 with 93% segmented neutrophils a glucose of 34 and a protein of 164. Red blood cells were 250 while Gram stain was initially negative culture essentially grown group B streptococcus which I have confirmed with the lab.      History reviewed. No pertinent past medical history.  Migraine Headaches  History reviewed. No pertinent past surgical history.ergies:   No surgeries  No Known  Allergies   Medications: I have reviewed patients current medications as documented in Epic Anti-infectives    Start     Dose/Rate Route Frequency Ordered Stop   12/24/13 0600  vancomycin (VANCOCIN) 1,250 mg in sodium chloride 0.9 % 250 mL IVPB  Status:  Discontinued     1,250 mg166.7 mL/hr over 90 Minutes Intravenous Every 12 hours 12/23/13 1751 12/24/13 0918   12/24/13 0200  cefTRIAXone (ROCEPHIN) 2 g in dextrose 5 % 50 mL IVPB - Premix     2 g100 mL/hr over 30 Minutes Intravenous Every 12 hours 12/23/13 1751     12/23/13 1800  vancomycin (VANCOCIN) 1,500 mg in sodium chloride 0.9 % 500 mL IVPB     1,500 mg250 mL/hr over 120 Minutes Intravenous STAT 12/23/13 1749 12/23/13 2326   12/23/13 1800  acyclovir (ZOVIRAX) 615 mg in dextrose 5 % 100 mL IVPB  Status:  Discontinued     10 mg/kg  61.5 kg (Ideal)112.3 mL/hr over 60 Minutes Intravenous 3 times per day 12/23/13 1750 12/24/13 0857   12/23/13 1400  cefTRIAXone (ROCEPHIN) 2 g in dextrose 5 % 50 mL IVPB     2 g100 mL/hr over 30 Minutes Intravenous  Once 12/23/13 1347 12/23/13 1428   12/23/13 1349  cefTRIAXone (ROCEPHIN) 2 G injection    Comments:  Eulogio Ditch   : cabinet override      12/23/13 1349 12/24/13 0159      Social History:  reports that he has never smoked. He uses smokeless tobacco. He reports that he drinks alcohol. He reports  that he does not use illicit drugs.  No family history on file.  As in HPI and primary teams notes otherwise 12 point review of systems is negative  Blood pressure 115/51, pulse 120, temperature 98.1 F (36.7 C), temperature source Oral, resp. rate 28, height $RemoveBe'5\' 5"'ailPMqmYY$  (1.651 m), weight 200 lb (90.719 kg), SpO2 97 %. General: Alert and awake, oriented x3, some difficulty with recall on recent events. HEENT: anicteric sclera, pupils reactive to light and accommodation, EOMI, oropharynx clear and without exudate CVS: tachycardic, normal r,  no murmur rubs or gallops Chest: clear to auscultation  bilaterally, no wheezing, rales or rhonchi Abdomen: soft nontender, nondistended, normal bowel sounds, Extremities: no  clubbing or edema noted bilaterally Skin: blotchy rash on his right lower extremity which I attempted to take a photo of through the [facility]  app which is not functioning well Neuro: nonfocal, strength and sensation intact   Results for orders placed or performed during the hospital encounter of 12/23/13 (from the past 48 hour(s))  CBC with Differential     Status: Abnormal   Collection Time: 12/23/13 11:25 AM  Result Value Ref Range   WBC 10.3 4.0 - 10.5 K/uL   RBC 5.55 4.22 - 5.81 MIL/uL   Hemoglobin 16.4 13.0 - 17.0 g/dL   HCT 46.3 39.0 - 52.0 %   MCV 83.4 78.0 - 100.0 fL   MCH 29.5 26.0 - 34.0 pg   MCHC 35.4 30.0 - 36.0 g/dL   RDW 12.9 11.5 - 15.5 %   Platelets 9 (LL) 150 - 400 K/uL    Comment: PLATELET COUNT CONFIRMED BY SMEAR PLATELETS APPEAR DECREASED SPECIMEN CHECKED FOR CLOTS    Neutrophils Relative % 83 (H) 43 - 77 %   Lymphocytes Relative 3 (L) 12 - 46 %   Monocytes Relative 2 (L) 3 - 12 %   Eosinophils Relative 0 0 - 5 %   Basophils Relative 0 0 - 1 %   Band Neutrophils 12 (H) 0 - 10 %   Neutro Abs 9.8 (H) 1.7 - 7.7 K/uL   Lymphs Abs 0.3 (L) 0.7 - 4.0 K/uL   Monocytes Absolute 0.2 0.1 - 1.0 K/uL   Eosinophils Absolute 0.0 0.0 - 0.7 K/uL   Basophils Absolute 0.0 0.0 - 0.1 K/uL   RBC Morphology HOWELL/JOLLY BODIES    Smear Review PLATELETS APPEAR DECREASED     Comment: PLATELET COUNT CONFIRMED BY SMEAR LARGE PLATELETS PRESENT GIANT PLATELETS SEEN SPECIMEN CHECKED FOR CLOTS   Comprehensive metabolic panel     Status: Abnormal   Collection Time: 12/23/13 11:25 AM  Result Value Ref Range   Sodium 141 137 - 147 mEq/L   Potassium 3.7 3.7 - 5.3 mEq/L   Chloride 100 96 - 112 mEq/L   CO2 22 19 - 32 mEq/L   Glucose, Bld 189 (H) 70 - 99 mg/dL   BUN 33 (H) 6 - 23 mg/dL   Creatinine, Ser 1.90 (H) 0.50 - 1.35 mg/dL   Calcium 9.0 8.4 - 10.5 mg/dL    Total Protein 6.2 6.0 - 8.3 g/dL   Albumin 3.0 (L) 3.5 - 5.2 g/dL   AST 52 (H) 0 - 37 U/L   ALT 49 0 - 53 U/L   Alkaline Phosphatase 193 (H) 39 - 117 U/L   Total Bilirubin 6.3 (H) 0.3 - 1.2 mg/dL   GFR calc non Af Amer 47 (L) >90 mL/min   GFR calc Af Amer 55 (L) >90 mL/min    Comment: (NOTE)  The eGFR has been calculated using the CKD EPI equation. This calculation has not been validated in all clinical situations. eGFR's persistently <90 mL/min signify possible Chronic Kidney Disease.    Anion gap 19 (H) 5 - 15  CSF cell count with differential collection tube #: 1     Status: Abnormal   Collection Time: 12/23/13 12:20 PM  Result Value Ref Range   Tube # 1    Color, CSF STRAW (A) COLORLESS   Appearance, CSF CLOUDY (A) CLEAR   Supernatant XANTHOCHROMIC    RBC Count, CSF 250 (H) 0 /cu mm   WBC, CSF 2230 (HH) 0 - 5 /cu mm    Comment: CRITICAL RESULT CALLED TO, READ BACK BY AND VERIFIED WITH: AMY BURNS,RN 1521 12/23/13 CLARK,S    Segmented Neutrophils-CSF 93 (H) 0 - 6 %   Lymphs, CSF 4 (L) 40 - 80 %   Monocyte-Macrophage-Spinal Fluid 3 (L) 15 - 45 %   Eosinophils, CSF 0 0 - 1 %   Other Cells, CSF 0     Comment: Performed at [facility]  CSF cell count with differential collection tube #: 4     Status: Abnormal   Collection Time: 12/23/13 12:20 PM  Result Value Ref Range   Tube # 4    Color, CSF STRAW (A) COLORLESS   Appearance, CSF CLOUDY (A) CLEAR   Supernatant XANTHOCHROMIC    RBC Count, CSF 245 (H) 0 /cu mm   WBC, CSF 2750 (HH) 0 - 5 /cu mm    Comment: CRITICAL RESULT CALLED TO, READ BACK BY AND VERIFIED WITH: AMY BURNS,RN 1521 12/23/13 CLARK,S    Segmented Neutrophils-CSF 82 (H) 0 - 6 %   Lymphs, CSF 10 (L) 40 - 80 %   Monocyte-Macrophage-Spinal Fluid 8 (L) 15 - 45 %   Eosinophils, CSF 0 0 - 1 %   Other Cells, CSF 0     Comment: Performed at [facility]  CSF culture     Status: None (Preliminary result)   Collection Time: 12/23/13 12:20 PM  Result  Value Ref Range   Specimen Description CSF    Special Requests NONE    Gram Stain      CYOSPIN NO WBC SEEN NO ORGANISMS SEEN Performed at [facility] [place] B STREP(S.AGALACTIAE)ISOLATED Note: CRITICAL RESULT CALLED TO, READ BACK BY AND VERIFIED WITH: STEPHANIE SHAW @ [phone] ON [id number] BY [facility] Performed at [facility]    Report Status PENDING   Protein and glucose, CSF     Status: Abnormal   Collection Time: 12/23/13 12:20 PM  Result Value Ref Range   Glucose, CSF 34 (L) 43 - 76 mg/dL   Total  Protein, CSF 164 (H) 15 - 45 mg/dL  Basic metabolic panel     Status: Abnormal   Collection Time: 12/23/13  3:15 PM  Result Value Ref Range   Sodium 140 137 - 147 mEq/L   Potassium 3.7 3.7 - 5.3 mEq/L   Chloride 103 96 - 112 mEq/L   CO2 23 19 - 32 mEq/L   Glucose, Bld 151 (H) 70 - 99 mg/dL   BUN 34 (H) 6 - 23 mg/dL   Creatinine, Ser 1.60 (H) 0.50 - 1.35 mg/dL   Calcium 7.9 (L) 8.4 - 10.5 mg/dL   GFR calc non Af Amer 58 (L) >90 mL/min   GFR calc Af Amer 67 (L) >90 mL/min  Comment: (NOTE) The eGFR has been calculated using the CKD EPI equation. This calculation has not been validated in all clinical situations. eGFR's persistently <90 mL/min signify possible Chronic Kidney Disease.    Anion gap 14 5 - 15  MRSA PCR Screening     Status: None   Collection Time: 12/23/13  5:27 PM  Result Value Ref Range   MRSA by PCR NEGATIVE NEGATIVE    Comment:        The GeneXpert MRSA Assay (FDA approved for NASAL specimens only), is one component of a comprehensive MRSA colonization surveillance program. It is not intended to diagnose MRSA infection nor to guide or monitor treatment for MRSA infections.   Troponin I     Status: None   Collection Time: 12/23/13  6:15 PM  Result Value Ref Range   Troponin I <0.30 <0.30 ng/mL    Comment:        Due to the release kinetics of cTnI, a negative result within the first hours of the onset of symptoms does  not rule out myocardial infarction with certainty. If myocardial infarction is still suspected, repeat the test at appropriate intervals.   DIC (disseminated intravasc coag) panel     Status: Abnormal (Preliminary result)   Collection Time: 12/23/13  6:15 PM  Result Value Ref Range   Prothrombin Time 15.5 (H) 11.6 - 15.2 seconds   INR 1.22 0.00 - 1.49   aPTT 30 24 - 37 seconds   Fibrinogen 403 204 - 475 mg/dL   D-Dimer, Quant >20.00 (H) 0.00 - 0.48 ug/mL-FEU    Comment:        AT THE INHOUSE ESTABLISHED CUTOFF VALUE OF 0.48 ug/mL FEU, THIS ASSAY HAS BEEN DOCUMENTED IN THE LITERATURE TO HAVE A SENSITIVITY AND NEGATIVE PREDICTIVE VALUE OF AT LEAST 98 TO 99%.  THE TEST RESULT SHOULD BE CORRELATED WITH AN ASSESSMENT OF THE CLINICAL PROBABILITY OF DVT / VTE.    Platelets 10 (LL) 150 - 400 K/uL    Comment: PLATELET COUNT CONFIRMED BY SMEAR NO SCHISTOCYTES SEEN CRITICAL RESULT CALLED TO, READ BACK BY AND VERIFIED WITH: ELLA BETHEL,RN. $RemoveBeforeD'@1901'[id number]$  11.11.15 BY GRINSTEAD,C    Smear Review PENDING   HIV antibody     Status: None   Collection Time: 12/23/13  6:15 PM  Result Value Ref Range   HIV 1&2 Ab, 4th Generation NONREACTIVE NONREACTIVE    Comment: (NOTE) A NONREACTIVE HIV Ag/Ab result does not exclude HIV infection since the time frame for seroconversion is variable. If acute HIV infection is suspected, a HIV-1 RNA Qualitative TMA test is recommended. HIV-1/2 Antibody Diff         Not indicated. HIV-1 RNA, Qual TMA           Not indicated. PLEASE NOTE: This information has been disclosed to you from records whose confidentiality may be protected by state law. If your state requires such protection, then the state law prohibits you from making any further disclosure of the information without the specific written consent of the person to whom it pertains, or as otherwise permitted by law. A general authorization for the release of medical or other information is NOT sufficient for  this purpose. The performance of this assay has not been clinically validated in patients less than 63 years old. Performed at [facility]    CK total and CKMB (cardiac)     Status: None   Collection Time: 12/23/13  6:15 PM  Result Value Ref Range   Total  CK 66 7 - 232 U/L   CK, MB <1.0 0.3 - 4.0 ng/mL   Relative Index NOT CALCULATED 0.0 - 2.5  ABO/Rh     Status: None   Collection Time: 12/23/13  6:15 PM  Result Value Ref Range   ABO/RH(D) A NEG   Troponin I     Status: None   Collection Time: 12/23/13 11:15 PM  Result Value Ref Range   Troponin I <0.30 <0.30 ng/mL    Comment:        Due to the release kinetics of cTnI, a negative result within the first hours of the onset of symptoms does not rule out myocardial infarction with certainty. If myocardial infarction is still suspected, repeat the test at appropriate intervals.   Prepare Pheresed Platelets     Status: None (Preliminary result)   Collection Time: 12/23/13 11:59 PM  Result Value Ref Range   Unit Number [id number]    Blood Component Type PLTPHER LR1    Unit division 00    Status of Unit ISSUED    Transfusion Status OK TO TRANSFUSE   Troponin I     Status: None   Collection Time: 12/24/13  5:03 AM  Result Value Ref Range   Troponin I <0.30 <0.30 ng/mL    Comment:        Due to the release kinetics of cTnI, a negative result within the first hours of the onset of symptoms does not rule out myocardial infarction with certainty. If myocardial infarction is still suspected, repeat the test at appropriate intervals.   Comprehensive metabolic panel     Status: Abnormal   Collection Time: 12/24/13  5:03 AM  Result Value Ref Range   Sodium 140 137 - 147 mEq/L   Potassium 4.0 3.7 - 5.3 mEq/L   Chloride 105 96 - 112 mEq/L   CO2 20 19 - 32 mEq/L   Glucose, Bld 158 (H) 70 - 99 mg/dL   BUN 44 (H) 6 - 23 mg/dL   Creatinine, Ser 1.48 (H) 0.50 - 1.35 mg/dL   Calcium 7.6 (L) 8.4 - 10.5 mg/dL   Total  Protein 4.8 (L) 6.0 - 8.3 g/dL   Albumin 2.3 (L) 3.5 - 5.2 g/dL   AST 41 (H) 0 - 37 U/L   ALT 37 0 - 53 U/L   Alkaline Phosphatase 95 39 - 117 U/L   Total Bilirubin 4.4 (H) 0.3 - 1.2 mg/dL   GFR calc non Af Amer 64 (L) >90 mL/min   GFR calc Af Amer 74 (L) >90 mL/min    Comment: (NOTE) The eGFR has been calculated using the CKD EPI equation. This calculation has not been validated in all clinical situations. eGFR's persistently <90 mL/min signify possible Chronic Kidney Disease.    Anion gap 15 5 - 15  CBC     Status: Abnormal   Collection Time: 12/24/13  5:03 AM  Result Value Ref Range   WBC 14.5 (H) 4.0 - 10.5 K/uL   RBC 4.65 4.22 - 5.81 MIL/uL   Hemoglobin 13.7 13.0 - 17.0 g/dL   HCT 38.4 (L) 39.0 - 52.0 %   MCV 82.6 78.0 - 100.0 fL   MCH 29.5 26.0 - 34.0 pg   MCHC 35.7 30.0 - 36.0 g/dL   RDW 13.3 11.5 - 15.5 %   Platelets 17 (LL) 150 - 400 K/uL    Comment: CRITICAL VALUE NOTED.  VALUE IS CONSISTENT WITH PREVIOUSLY REPORTED AND CALLED VALUE.  Urinalysis, Routine w reflex  microscopic     Status: Abnormal   Collection Time: 12/24/13  5:30 AM  Result Value Ref Range   Color, Urine AMBER (A) YELLOW    Comment: BIOCHEMICALS MAY BE AFFECTED BY COLOR   APPearance CLOUDY (A) CLEAR   Specific Gravity, Urine 1.034 (H) 1.005 - 1.030   pH 5.5 5.0 - 8.0   Glucose, UA NEGATIVE NEGATIVE mg/dL   Hgb urine dipstick SMALL (A) NEGATIVE   Bilirubin Urine LARGE (A) NEGATIVE   Ketones, ur 15 (A) NEGATIVE mg/dL   Protein, ur 30 (A) NEGATIVE mg/dL   Urobilinogen, UA 2.0 (H) 0.0 - 1.0 mg/dL   Nitrite POSITIVE (A) NEGATIVE   Leukocytes, UA SMALL (A) NEGATIVE  Urine microscopic-add on     Status: Abnormal   Collection Time: 12/24/13  5:30 AM  Result Value Ref Range   Squamous Epithelial / LPF RARE RARE   WBC, UA 3-6 <3 WBC/hpf   RBC / HPF 0-2 <3 RBC/hpf   Bacteria, UA MANY (A) RARE   Casts GRANULAR CAST (A) NEGATIVE    Comment: HYALINE CASTS    '@BRIEFLABTABLE'$ (sdes,specrequest,cult,reptstatus)   ) Recent Results (from the past 720 hour(s))  CSF culture     Status: None (Preliminary result)   Collection Time: 12/23/13 12:20 PM  Result Value Ref Range Status   Specimen Description CSF  Final   Special Requests NONE  Final   Gram Stain   Final    CYOSPIN NO WBC SEEN NO ORGANISMS SEEN Performed at [facility]    Culture   Final    FEW GROUP B STREP(S.AGALACTIAE)ISOLATED Note: CRITICAL RESULT CALLED TO, READ BACK BY AND VERIFIED WITH: STEPHANIE SHAW @ [phone] ON [id number] BY [facility] Performed at [facility]    Report Status PENDING  Incomplete  MRSA PCR Screening     Status: None   Collection Time: 12/23/13  5:27 PM  Result Value Ref Range Status   MRSA by PCR NEGATIVE NEGATIVE Final    Comment:        The GeneXpert MRSA Assay (FDA approved for NASAL specimens only), is one component of a comprehensive MRSA colonization surveillance program. It is not intended to diagnose MRSA infection nor to guide or monitor treatment for MRSA infections.      Impression/Recommendation  Active Problems:   Thrombocytopenia   Meningitis due to bacterium   Acute renal failure syndrome   Dehydration   [name] is a 28 y.o. male with  Group B streptococcal meningitis and sepsis.  #1 Group B streptococcal meningitis with sepsis: Certainly not atypical organism for meningitis in an adult. I worry he may have something else going on in addition to meningitis specifically potential bacteremia and brain abscesses  --Narrowed to ceftriaxone  --DC droplet precautions --Blood cultures to ensure he if he was bacteremic but he is now clearing bacteremia  --MRI of the brain with contrast to ensure that he does not have brain abscesses, if cr not sufficiently normal would recheck around 1300. I wrote for ativan for his claustrophobia.  I spent greater than 60 minutes with the patient including greater than 50% of time  in face to face counsel of the patient and in coordination of their care.   #2 screening he is HIV negative will screen for viral hepatitis     12/24/2013, 10:26 AM   Thank you so much for this interesting consult  [place] [facility] [place] (pager) [phone] (office) 12/24/2013, 10:26 AM  Rhina [place] 12/24/2013, 10:26 AM

## 2013-12-24 NOTE — Progress Notes (Signed)
Critical lab value called. Spinal fluid (+) few group B strep.  Dr. Joseph Art paged and notified.

## 2013-12-24 NOTE — Plan of Care (Signed)
Problem: Phase I Progression Outcomes Goal: Pain controlled with appropriate interventions Outcome: Completed/Met Date Met:  12/24/13 Goal: OOB as tolerated unless otherwise ordered Outcome: Completed/Met Date Met:  12/24/13 Goal: Initial discharge plan identified Outcome: Completed/Met Date Met:  12/24/13

## 2013-12-24 NOTE — Progress Notes (Signed)
*  PRELIMINARY RESULTS* Vascular Ultrasound Lower extremity venous duplex has been completed.  Preliminary findings: no evidence of DVT  Farrel Demark, RDMS, RVT  12/24/2013, 4:39 PM

## 2013-12-24 NOTE — Progress Notes (Addendum)
Pharmacy asked to dose Lovenox for VTE prophylaxis.  Patient has a BMI of 33 with height of 5'5" and wt of 200lb. SCr 1.48 with est CrCL ~88mL/min.  Noted patient with nml hgb and thrombocytopenia- suspected DIC. D-dimer elevated at >20 and PT prolonged. Baseline INR 1.2.  Plan: -obesity dosing Lovenox- 0.5mg /kg daily for prophylaxis = 45mg  subQ q24h -follow for s/s bleeding closely with thrombocytopenia -pharmacy to sign off as no further adjustments needed. Please re-consult if patient's scr worsens significantly.  Amylynn Fano D. Esly Selvage, PharmD, BCPS Clinical Pharmacist Pager: [phone] 12/24/2013 2:48 PM

## 2013-12-24 NOTE — Progress Notes (Signed)
[place] TEAM 1 - Stepdown/ICU TEAM Progress Note  [name] HNG:[id number] DOB: [birth year] DOA: 12/23/2013 PCP: No primary care provider on file.  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 The patient's symptoms started about 5 days ago when he developed chills, rigors,, headache, fever, neck stiffness, nausea vomiting starting on Saturday. Headache mostly temporal in Location.patient also developed sensitivity to light and sound. He was seen at urgent care on Sunday and diagnosed with the flu. Symptoms worsened because of persistent nausea. He been to the ED on Monday and diagnosed with abnormal liver function and thrombocytopenia. Had a CT scan of the abdomen which was negative. Subsequently followed up with his PCP was sent into the ER for suspected viral meningitis.lumbar puncture shows significantly elevated CSF WBC count. Patient is tachycardic in the 140s.platelet count of 9, creatinine 1.9  HPI/Subjective: 11/12 A/O 4, negative headache, negative CP, negative SOB, negative back pain, positive neck pain with flexation   Assessment/Plan: Meningitis, streptococcal/sepsis -Blood culture 2, pending -Status post lumbar puncture, Gram stain negative -CSF culture positive GROUP B STREP(S.AGALACTIAE); -CSF WBC shows 2230 white blood cells, 93% neutrophils -followed by infectious disease -HIV antibody negative -Check acute hepatitis panel -Continue ceftriaxone per infectious disease -brain MRI pending  Acute renal failure likely ATN, -continue normal saline 34ml/hr  Thrombocytopenia -Likely secondary to DIC secondary to infection; DIC panel positive -DC all heparin products, check HIT panel -currently patient has no signs or symptoms of bleeding, would not transfuse additional platelets. Monitor closely for signs of bleeding.  Leg pain, with calf muscle  tenderness -Will obtain ultrasound of the right lower extremity to rule out hematoma in the setting of thrombocytopenia -Vascular checks -Check CK to rule out compartment syndrome     Code Status: FULL Family Communication: family present at time of exam Disposition Plan: per infectious disease    Consultants: 11/11 Dr. Gwyneth Sprout (PCCM)  Procedure/Significant Events: 11/11 LP    Culture 11/11 CSF;positive GROUP B STREP(S.AGALACTIAE); 11/12 blood 2 pending   Antibiotics: Acyclovir 11/11>> Stopped 11/12 Vancomycin 11/11>>stopped 11/12 Ceftriaxone 11/11>>  DVT prophylaxis: SCD   Devices    LINES / TUBES:      Continuous Infusions: . sodium chloride 75 mL/hr at 12/24/13 1412    Objective: VITAL SIGNS: Temp: 97.7 F (36.5 C) (11/12 1132) Temp Source: Oral (11/12 1132) BP: 120/64 mmHg (11/12 1132) Pulse Rate: 108 (11/12 1132) SPO2; FIO2:   Intake/Output Summary (Last 24 hours) at 12/24/13 1414 Last data filed at 12/24/13 1200  Gross per 24 hour  Intake 1324.05 ml  Output    400 ml  Net 924.05 ml     Exam: General: A/O 4, NAD,No acute respiratory distress Lungs: Clear to auscultation bilaterally without wheezes or crackles Cardiovascular: tachycardic,Regular rhythm positive systolic murmur grade 1/6, negative gallop or rub normal S1 and S2 Abdomen: Nontender, nondistended, soft, bowel sounds positive, no rebound, no ascites, no appreciable mass Extremities: rash right posterior calf, painful to palpation resolving Neurologic; positive Babinski sign, negative Kernig sign remainder of neuro exam within normal limits  Data Reviewed: Basic Metabolic Panel:  Recent Labs Lab 12/23/13 1125 12/23/13 1515 12/24/13 0503  NA 141 140 140  K 3.7 3.7 4.0  CL 100 103 105  CO2 22 23 20   GLUCOSE 189* 151* 158*  BUN 33* 34* 44*  CREATININE 1.90* 1.60* 1.48*  CALCIUM 9.0 7.9* 7.6*   Liver Function  Tests:  Recent Labs Lab 12/23/13 1125  12/24/13 0503  AST 52* 41*  ALT 49 37  ALKPHOS 193* 95  BILITOT 6.3* 4.4*  PROT 6.2 4.8*  ALBUMIN 3.0* 2.3*   No results for input(s): LIPASE, AMYLASE in the last 168 hours. No results for input(s): AMMONIA in the last 168 hours. CBC:  Recent Labs Lab 12/23/13 1125 12/23/13 1815 12/24/13 0503  WBC 10.3  --  14.5*  NEUTROABS 9.8*  --   --   HGB 16.4  --  13.7  HCT 46.3  --  38.4*  MCV 83.4  --  82.6  PLT 9* 10* 17*   Cardiac Enzymes:  Recent Labs Lab 12/23/13 1815 12/23/13 2315 12/24/13 0503  CKTOTAL 66  --   --   CKMB <1.0  --   --   TROPONINI <0.30 <0.30 <0.30   BNP (last 3 results) No results for input(s): PROBNP in the last 8760 hours. CBG: No results for input(s): GLUCAP in the last 168 hours.  Recent Results (from the past 240 hour(s))  CSF culture     Status: None (Preliminary result)   Collection Time: 12/23/13 12:20 PM  Result Value Ref Range Status   Specimen Description CSF  Final   Special Requests NONE  Final   Gram Stain   Final    CYOSPIN NO WBC SEEN NO ORGANISMS SEEN Performed at [facility]    Culture   Final    FEW GROUP B STREP(S.AGALACTIAE)ISOLATED Note: CRITICAL RESULT CALLED TO, READ BACK BY AND VERIFIED WITH: STEPHANIE SHAW @ 0744 ON [id number] BY [facility] Performed at [facility]    Report Status PENDING  Incomplete  MRSA PCR Screening     Status: None   Collection Time: 12/23/13  5:27 PM  Result Value Ref Range Status   MRSA by PCR NEGATIVE NEGATIVE Final    Comment:        The GeneXpert MRSA Assay (FDA approved for NASAL specimens only), is one component of a comprehensive MRSA colonization surveillance program. It is not intended to diagnose MRSA infection nor to guide or monitor treatment for MRSA infections.      Studies:  Recent x-ray studies have been reviewed in detail by the Attending Physician  Scheduled Meds:  Scheduled Meds: . sodium chloride   Intravenous Once  . cefTRIAXone (ROCEPHIN)   IV  2 g Intravenous Q12H    Time spent on care of this patient: 40 mins   Drema DallasWOODS, Keylah Darwish, J , MD   [facility] Office  [phone] Pager [phone]- [phone]  On-Call/Text Page:      [email or web address] [email or web address]      password [email or web address]  If 7PM-7AM, please contact night-coverage [email or web address] Password [email or web address] 12/24/2013, 2:14 PM   LOS: 1 day

## 2013-12-24 NOTE — Progress Notes (Signed)
Paged NP [facility]. Made NP aware of pts BP running soft 108/47. NP okay with that and stated to monitor. Pt is also confused at times, pulling off leads and making confused statements. Pt is currently only alert to self and trying to get out of bed. Bed alarm is on and nurse is near room. NP stated to watch pt and put in an order for a sitter but no changes at this time. Sitter currently unavailable.  Shaune Westfall M. Derrell Lolling, RN, BSN 12/24/2013 11:42 PM

## 2013-12-24 NOTE — Progress Notes (Signed)
Utilization review completed. Dimples Probus, RN, BSN. 

## 2013-12-25 DIAGNOSIS — R6521 Severe sepsis with septic shock: Secondary | ICD-10-CM

## 2013-12-25 DIAGNOSIS — G060 Intracranial abscess and granuloma: Secondary | ICD-10-CM

## 2013-12-25 LAB — BASIC METABOLIC PANEL
Anion gap: 13 (ref 5–15)
BUN: 40 mg/dL — AB (ref 6–23)
CO2: 19 mEq/L (ref 19–32)
Calcium: 7.7 mg/dL — ABNORMAL LOW (ref 8.4–10.5)
Chloride: 105 mEq/L (ref 96–112)
Creatinine, Ser: 1.22 mg/dL (ref 0.50–1.35)
GFR calc Af Amer: >90 mL/min (ref >90)
GFR calc non Af Amer: 81 mL/min — ABNORMAL LOW (ref >90)
Glucose, Bld: 163 mg/dL — ABNORMAL HIGH (ref 70–99)
POTASSIUM: 4.3 meq/L (ref 3.7–5.3)
SODIUM: 137 meq/L (ref 137–147)

## 2013-12-25 LAB — CBC WITH DIFFERENTIAL/PLATELET
BASOS ABS: 0 10*3/uL (ref 0.0–0.1)
Basophils Relative: 0 % (ref 0–1)
EOS ABS: 0 10*3/uL (ref 0.0–0.7)
Eosinophils Relative: 0 % (ref 0–5)
HCT: 41.3 % (ref 39.0–52.0)
Hemoglobin: 14.4 g/dL (ref 13.0–17.0)
Lymphocytes Relative: 15 % (ref 12–46)
Lymphs Abs: 1.8 10*3/uL (ref 0.7–4.0)
MCH: 29.3 pg (ref 26.0–34.0)
MCHC: 34.9 g/dL (ref 30.0–36.0)
MCV: 84.1 fL (ref 78.0–100.0)
Monocytes Absolute: 1.3 10*3/uL — ABNORMAL HIGH (ref 0.1–1.0)
Monocytes Relative: 11 % (ref 3–12)
NEUTROS ABS: 9.1 10*3/uL — AB (ref 1.7–7.7)
Neutrophils Relative %: 74 % (ref 43–77)
Platelets: 24 10*3/uL — CL (ref 150–400)
RBC: 4.91 MIL/uL (ref 4.22–5.81)
RDW: 13.6 % (ref 11.5–15.5)
SMEAR REVIEW: DECREASED
WBC: 12.2 10*3/uL — ABNORMAL HIGH (ref 4.0–10.5)

## 2013-12-25 LAB — CSF CULTURE W GRAM STAIN

## 2013-12-25 LAB — COMPREHENSIVE METABOLIC PANEL
ALT: 32 U/L (ref 0–53)
AST: 29 U/L (ref 0–37)
Albumin: 2.1 g/dL — ABNORMAL LOW (ref 3.5–5.2)
Alkaline Phosphatase: 69 U/L (ref 39–117)
Anion gap: 14 (ref 5–15)
BUN: 40 mg/dL — ABNORMAL HIGH (ref 6–23)
CALCIUM: 7.8 mg/dL — AB (ref 8.4–10.5)
CO2: 20 mEq/L (ref 19–32)
Chloride: 104 mEq/L (ref 96–112)
Creatinine, Ser: 1.3 mg/dL (ref 0.50–1.35)
GFR calc non Af Amer: 75 mL/min — ABNORMAL LOW (ref >90)
GFR, EST AFRICAN AMERICAN: 87 mL/min — AB (ref >90)
GLUCOSE: 176 mg/dL — AB (ref 70–99)
Potassium: 4.4 mEq/L (ref 3.7–5.3)
Sodium: 138 mEq/L (ref 137–147)
TOTAL PROTEIN: 4.8 g/dL — AB (ref 6.0–8.3)
Total Bilirubin: 2.1 mg/dL — ABNORMAL HIGH (ref 0.3–1.2)

## 2013-12-25 LAB — MAGNESIUM: Magnesium: 3.2 mg/dL — ABNORMAL HIGH (ref 1.5–2.5)

## 2013-12-25 LAB — HEPATITIS PANEL, ACUTE
HCV Ab: NEGATIVE
HEP A IGM: NONREACTIVE
Hep B C IgM: NONREACTIVE
Hepatitis B Surface Ag: NEGATIVE

## 2013-12-25 LAB — PREPARE PLATELET PHERESIS: Unit division: 0

## 2013-12-25 LAB — CSF CULTURE: GRAM STAIN: NONE SEEN

## 2013-12-25 MED ORDER — HALOPERIDOL LACTATE 5 MG/ML IJ SOLN
5.0000 mg | Freq: Once | INTRAMUSCULAR | Status: AC
  Administered 2013-12-25: 5 mg via INTRAVENOUS
  Filled 2013-12-25: qty 1

## 2013-12-25 MED ORDER — LORAZEPAM 2 MG/ML IJ SOLN
INTRAMUSCULAR | Status: AC
  Filled 2013-12-25: qty 1

## 2013-12-25 MED ORDER — ALPRAZOLAM 0.5 MG PO TABS: 0.5000 mg | ORAL_TABLET | Freq: Three times a day (TID) | ORAL | Status: DC | PRN

## 2013-12-25 MED ORDER — HALOPERIDOL LACTATE 5 MG/ML IJ SOLN
1.0000 mg | Freq: Four times a day (QID) | INTRAMUSCULAR | Status: DC | PRN
  Administered 2013-12-25 – 2013-12-28 (×5): 2 mg via INTRAVENOUS
  Filled 2013-12-25 (×5): qty 1

## 2013-12-25 MED ORDER — LORAZEPAM 2 MG/ML IJ SOLN
0.5000 mg | Freq: Once | INTRAMUSCULAR | Status: AC
  Administered 2013-12-25: 0.5 mg via INTRAVENOUS

## 2013-12-25 NOTE — Progress Notes (Signed)
[place] TEAM 1 - Stepdown/ICU TEAM Progress Note  [name] MAY:[id number] DOB: May 12, 1985 DOA: 12/23/2013 PCP: No primary care provider on file.  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 The patient's symptoms started about 5 days prior when he developed chills, rigors, headache, fever, neck stiffness, nausea, and vomiting. He was seen at an Urgent Care and diagnosed with the flu. Went to the ED the following day and was diagnosed with abnormal liver function and thrombocytopenia. Had a CT scan of the abdomen which was negative. Subsequently followed up with his PCP and was sent back to the ER for suspected meningitis.  In the ED lumbar puncture noted elevated CSF WBC count. Patient was tachycardic in the 140s, w/ platelet count of 9, creatinine 1.9.  HPI/Subjective: Pt has been very agitated and anxious today.  He does not respond to verbal re-directing.  He is delusional, feeling that he is receiving inappropriate/unnecessary tx and is fixated on going home.    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 sepsis -CSF culture positive GROUP B STREP -ID following  -HIV antibody negative -Continue ceftriaxone  -brain MRI confirms bacterial meningitis, cerebritis, and scattered microabscesses   Acute renal failure likely ATN -improving w/ volume expansion   Thrombocytopenia -secondary to DIC - plt count climbing  -currently patient has no signs or symptoms of bleeding  Leg pain, with calf muscle tenderness -doppler negative for DVT - CK normal   Code Status: FULL Family Communication: spoke w/ girlfriend (common law wife), father, and MIL at bedside  Disposition Plan: SDU - considered transfer but given need for haldol for now will keep in SDU   Consultants: ID  Procedure/Significant Events: 11/11 LP 11/12 B LE venous  duplex negative for DVT  Antibiotics: Acyclovir 11/11>11/12 Vancomycin 11/11>11/12 Ceftriaxone 11/11 >  DVT prophylaxis: SCD  Objective: Blood pressure 96/52, pulse 102, temperature 96.7 F (35.9 C), temperature source Axillary, resp. rate 15, height 5\' 5"  (1.651 m), weight 95.754 kg (211 lb 1.6 oz), SpO2 95 %.  Intake/Output Summary (Last 24 hours) at 12/25/13 1017 Last data filed at 12/25/13 0700  Gross per 24 hour  Intake   1035 ml  Output   1050 ml  Net    -15 ml   Exam: General: oriented and alert but agitated and confrontational, no acute respiratory distress Lungs: Clear to auscultation bilaterally without wheezes or crackles Cardiovascular: tachycardic, regular rhythm - systolic murmur grade 2/6, negative gallop or rub Abdomen: Nontender, nondistended, soft, bowel sounds positive, no rebound, no ascites, no appreciable mass Extremities: no signif C/C/E B le    Data Reviewed: Basic Metabolic Panel:  Recent Labs Lab 12/23/13 1125 12/23/13 1515 12/24/13 0503 12/25/13 0315  NA 141 140 140 138  K 3.7 3.7 4.0 4.4  CL 100 103 105 104  CO2 22 23 20 20   GLUCOSE 189* 151* 158* 176*  BUN 33* 34* 44* 40*  CREATININE 1.90* 1.60* 1.48* 1.30  CALCIUM 9.0 7.9* 7.6* 7.8*  MG  --   --   --  3.2*   Liver Function Tests:  Recent Labs Lab 12/23/13 1125 12/24/13 0503 12/25/13 0315  AST 52* 41* 29  ALT 49 37 32  ALKPHOS 193* 95 69  BILITOT 6.3* 4.4* 2.1*  PROT 6.2 4.8* 4.8*  ALBUMIN 3.0* 2.3* 2.1*   CBC:  Recent Labs Lab 12/23/13 1125 12/23/13 1815 12/24/13 0503  12/25/13 0315  WBC 10.3  --  14.5* 12.2*  NEUTROABS 9.8*  --   --  9.1*  HGB 16.4  --  13.7 14.4  HCT 46.3  --  38.4* 41.3  MCV 83.4  --  82.6 84.1  PLT 9* 10* 17* 24*   Cardiac Enzymes:  Recent Labs Lab 12/23/13 1815 12/23/13 2315 12/24/13 0503  CKTOTAL 66  --   --   CKMB <1.0  --   --   TROPONINI <0.30 <0.30 <0.30     Recent Results (from the past 240 hour(s))  CSF culture      Status: None   Collection Time: 12/23/13 12:20 PM  Result Value Ref Range Status   Specimen Description CSF  Final   Special Requests NONE  Final   Gram Stain   Final    CYOSPIN NO WBC SEEN NO ORGANISMS SEEN Performed at [facility]    Culture   Final    FEW GROUP B STREP(S.AGALACTIAE)ISOLATED Note: CRITICAL RESULT CALLED TO, READ BACK BY AND VERIFIED WITH: STEPHANIE SHAW @ 0744 ON [id number] BY [facility] Performed at [facility]    Report Status 12/25/2013 FINAL  Final   Organism ID, Bacteria GROUP B STREP(S.AGALACTIAE)ISOLATED  Final      Susceptibility   Group b strep(s.agalactiae)isolated - MIC*    ERYTHROMYCIN >=8 RESISTANT Resistant     PENICILLIN <=0.06 SENSITIVE Sensitive     VANCOMYCIN 0.5 SENSITIVE Sensitive     AMPICILLIN <=0.25 SENSITIVE Sensitive     * FEW GROUP B STREP(S.AGALACTIAE)ISOLATED  MRSA PCR Screening     Status: None   Collection Time: 12/23/13  5:27 PM  Result Value Ref Range Status   MRSA by PCR NEGATIVE NEGATIVE Final    Comment:        The GeneXpert MRSA Assay (FDA approved for NASAL specimens only), is one component of a comprehensive MRSA colonization surveillance program. It is not intended to diagnose MRSA infection nor to guide or monitor treatment for MRSA infections.   Culture, blood (routine x 2)     Status: None (Preliminary result)   Collection Time: 12/24/13 10:50 AM  Result Value Ref Range Status   Specimen Description BLOOD RIGHT HAND  Final   Special Requests   Final    BOTTLES DRAWN AEROBIC AND ANAEROBIC 6CC AER 5CC ANA   Culture  Setup Time   Final    12/24/2013 18:48 Performed at [facility]    Culture   Final           BLOOD CULTURE RECEIVED NO GROWTH TO DATE CULTURE WILL BE HELD FOR 5 DAYS BEFORE ISSUING A FINAL NEGATIVE REPORT Performed at [facility]    Report Status PENDING  Incomplete  Culture, blood (routine x 2)     Status: None (Preliminary result)   Collection Time: 12/24/13 10:58  AM  Result Value Ref Range Status   Specimen Description BLOOD LEFT ANTECUBITAL  Final   Special Requests BOTTLES DRAWN AEROBIC AND ANAEROBIC 10CC  Final   Culture  Setup Time   Final    12/24/2013 18:48 Performed at [facility]    Culture   Final           BLOOD CULTURE RECEIVED NO GROWTH TO DATE CULTURE WILL BE HELD FOR 5 DAYS BEFORE ISSUING A FINAL NEGATIVE REPORT Performed at [facility]    Report Status PENDING  Incomplete     Studies:  Recent x-ray studies have  been reviewed in detail by the Attending Physician  Scheduled Meds:  Scheduled Meds: . sodium chloride   Intravenous Once  . cefTRIAXone (ROCEPHIN)  IV  2 g Intravenous Q12H  . LORazepam        Time spent on care of this patient: 35 mins  Lonia Blood, MD [facility] For Consults/Admissions - Flow Manager - [phone] Office  [phone] Pager [phone]  On-Call/Text Page:      [email or web address]      password [facility]  12/25/2013, 10:17 AM   LOS: 2 days

## 2013-12-25 NOTE — Progress Notes (Signed)
Pt very agitated and confused; safety sitter at bedside.  We let him walk around the unit, but he placed right foot on top of IV pole wheels and almost slipped/slid.  Pt states he feels like a "little kid that got dropped off at [employer] and told to play with all the toys/lights but that nobody ever explained to him why he is here or what is going on".  Pt has refused prn Xanax and says he is not taking any more "crap" until he sees a doctor because the Ativan that was given yesterday made the claustrophobia 100x worse.  Girlfriend & family at bedside.  MD made aware.  Will continue to monitor.

## 2013-12-25 NOTE — Progress Notes (Signed)
Pt extremely confused and agitated. Complaining of claustrophobia stating that he is going to leave the hospital and going outside. NP Paged. Ativan 1 mg given.  Guadalupe Kerekes M. Derrell Lolling, RN, BSN 12/25/2013 12:45 AM

## 2013-12-25 NOTE — Progress Notes (Signed)
[facility]  Day # 3 ceftriaxone  Subjective: Wants to leave, very agitated   Antibiotics:  Anti-infectives    Start     Dose/Rate Route Frequency Ordered Stop   12/24/13 0600  vancomycin (VANCOCIN) 1,250 mg in sodium chloride 0.9 % 250 mL IVPB  Status:  Discontinued     1,250 mg166.7 mL/hr over 90 Minutes Intravenous Every 12 hours 12/23/13 1751 12/24/13 0918   12/24/13 0200  cefTRIAXone (ROCEPHIN) 2 g in dextrose 5 % 50 mL IVPB - Premix     2 g100 mL/hr over 30 Minutes Intravenous Every 12 hours 12/23/13 1751     12/23/13 1800  vancomycin (VANCOCIN) 1,500 mg in sodium chloride 0.9 % 500 mL IVPB     1,500 mg250 mL/hr over 120 Minutes Intravenous STAT 12/23/13 1749 12/23/13 2326   12/23/13 1800  acyclovir (ZOVIRAX) 615 mg in dextrose 5 % 100 mL IVPB  Status:  Discontinued     10 mg/kg  61.5 kg (Ideal)112.3 mL/hr over 60 Minutes Intravenous 3 times per day 12/23/13 1750 12/24/13 0857   12/23/13 1400  cefTRIAXone (ROCEPHIN) 2 g in dextrose 5 % 50 mL IVPB     2 g100 mL/hr over 30 Minutes Intravenous  Once 12/23/13 1347 12/23/13 1428   12/23/13 1349  cefTRIAXone (ROCEPHIN) 2 G injection    Comments:  [name]   : cabinet override      12/23/13 1349 12/24/13 0159      Medications: Scheduled Meds: . sodium chloride   Intravenous Once  . cefTRIAXone (ROCEPHIN)  IV  2 g Intravenous Q12H   Continuous Infusions: . sodium chloride 75 mL/hr at 12/24/13 1412   PRN Meds:.acetaminophen **OR** acetaminophen, ALPRAZolam, levalbuterol, ondansetron **OR** ondansetron (ZOFRAN) IV    Objective: Weight change: 11 lb 1.6 oz (5.035 kg)  Intake/Output Summary (Last 24 hours) at 12/25/13 1424 Last data filed at 12/25/13 1415  Gross per 24 hour  Intake   1385 ml  Output   1050 ml  Net    335 ml   Blood pressure 114/53, pulse 103, temperature 97.5 F (36.4 C), temperature source Axillary, resp. rate 25, height 5\' 5"  (1.651 m), weight 211 lb 1.6 oz (95.754  kg), SpO2 96 %. Temp:  [96.7 F (35.9 C)-98.3 F (36.8 C)] 97.5 F (36.4 C) (11/13 1244) Pulse Rate:  [100-106] 103 (11/13 1244) Resp:  [15-34] 25 (11/13 1244) BP: (95-126)/(41-93) 114/53 mmHg (11/13 1244) SpO2:  [94 %-96 %] 96 % (11/13 1244) Weight:  [211 lb 1.6 oz (95.754 kg)] 211 lb 1.6 oz (95.754 kg) (11/13 0359)  Physical Exam: General: Alert and awake, oriented x3, extremely agitated and fidgety, much of what he says make sense but he seems ureasonable in his expectations HEENT: anicteric sclera,EOMI CVS  Tachy rate,  no murmur rubs or gallops Chest: clear to auscultation bilaterally, no wheezing, rales or rhonchi Abdomen:  nondistended, normal bowel sounds, Skin: no rashes Neuro: nonfocal  CBC:  Recent Labs Lab 12/23/13 1125 12/23/13 1815 12/24/13 0503 12/25/13 0315  HGB 16.4  --  13.7 14.4  HCT 46.3  --  38.4* 41.3  PLT 9* 10* 17* 24*  INR  --  1.22  --   --   APTT  --  30  --   --      BMET  Recent Labs  12/25/13 0315 12/25/13 1309  NA 138 137  K 4.4 4.3  CL 104 105  CO2 20 19  GLUCOSE 176*  163*  BUN 40* 40*  CREATININE 1.30 1.22  CALCIUM 7.8* 7.7*     Liver Panel   Recent Labs  12/24/13 0503 12/25/13 0315  PROT 4.8* 4.8*  ALBUMIN 2.3* 2.1*  AST 41* 29  ALT 37 32  ALKPHOS 95 69  BILITOT 4.4* 2.1*       Sedimentation Rate No results for input(s): ESRSEDRATE in the last 72 hours. C-Reactive Protein No results for input(s): CRP in the last 72 hours.  Micro Results: Recent Results (from the past 720 hour(s))  CSF culture     Status: None   Collection Time: 12/23/13 12:20 PM  Result Value Ref Range Status   Specimen Description CSF  Final   Special Requests NONE  Final   Gram Stain   Final    CYOSPIN NO WBC SEEN NO ORGANISMS SEEN Performed at [facility]    Culture   Final    FEW GROUP B STREP(S.AGALACTIAE)ISOLATED Note: CRITICAL RESULT CALLED TO, READ BACK BY AND VERIFIED WITH: [name] @ 0744 ON [id number] BY  [facility] Performed at [facility]    Report Status 12/25/2013 FINAL  Final   Organism ID, Bacteria GROUP B STREP(S.AGALACTIAE)ISOLATED  Final      Susceptibility   Group b strep(s.agalactiae)isolated - MIC*    ERYTHROMYCIN >=8 RESISTANT Resistant     PENICILLIN <=0.06 SENSITIVE Sensitive     VANCOMYCIN 0.5 SENSITIVE Sensitive     AMPICILLIN <=0.25 SENSITIVE Sensitive     * FEW GROUP B STREP(S.AGALACTIAE)ISOLATED  MRSA PCR Screening     Status: None   Collection Time: 12/23/13  5:27 PM  Result Value Ref Range Status   MRSA by PCR NEGATIVE NEGATIVE Final    Comment:        The GeneXpert MRSA Assay (FDA approved for NASAL specimens only), is one component of a comprehensive MRSA colonization surveillance program. It is not intended to diagnose MRSA infection nor to guide or monitor treatment for MRSA infections.   Culture, blood (routine x 2)     Status: None (Preliminary result)   Collection Time: 12/24/13 10:50 AM  Result Value Ref Range Status   Specimen Description BLOOD RIGHT HAND  Final   Special Requests   Final    BOTTLES DRAWN AEROBIC AND ANAEROBIC 6CC AER 5CC ANA   Culture  Setup Time   Final    12/24/2013 18:48 Performed at [facility]    Culture   Final           BLOOD CULTURE RECEIVED NO GROWTH TO DATE CULTURE WILL BE HELD FOR 5 DAYS BEFORE ISSUING A FINAL NEGATIVE REPORT Performed at [facility]    Report Status PENDING  Incomplete  Culture, blood (routine x 2)     Status: None (Preliminary result)   Collection Time: 12/24/13 10:58 AM  Result Value Ref Range Status   Specimen Description BLOOD LEFT ANTECUBITAL  Final   Special Requests BOTTLES DRAWN AEROBIC AND ANAEROBIC 10CC  Final   Culture  Setup Time   Final    12/24/2013 18:48 Performed at [facility]    Culture   Final           BLOOD CULTURE RECEIVED NO GROWTH TO DATE CULTURE WILL BE HELD FOR 5 DAYS BEFORE ISSUING A FINAL NEGATIVE REPORT Performed at [facility]    Report Status PENDING  Incomplete    Studies/Results: Mr [name] Contrast  12/24/2013   CLINICAL DATA:  Headache with fever and neck stiffness. Group B Streptococcus grown from cerebrospinal fluid. Elevated CSF white count. Initial encounter.  EXAM: MRI HEAD WITHOUT AND WITH CONTRAST  TECHNIQUE: Multiplanar, multiecho pulse sequences of the brain and surrounding structures were obtained without and with intravenous contrast.  CONTRAST:  20mL MULTIHANCE GADOBENATE DIMEGLUMINE 529 MG/ML IV SOLN  COMPARISON:  12/23/2013 CT head is negative  FINDINGS: The study is significantly motion degraded but overall diagnostic.  There are numerous 2-3 mm size areas of restricted diffusion predominantly at the gray-white junction of the cerebral hemispheres, at least one in the LEFT cerebellum, associated with peripheral collections of fluid or pus having restricted diffusion in the subarachnoid spaces. Suspected bifrontal interhemispheric cortical edema of a fairly symmetric nature as seen on axial T2 and FLAIR images (image 15 series 5 and 6). Post infusion, definite foci of abnormal parenchymal enhancement difficult to confirm, due to patient motion. Suspected linear enhancement over the RIGHT frontal lobe. Findings are consistent with bacterial meningitis, and widespread microabscesses. Continued surveillance warranted.  Normal cerebral volume. No definite white matter disease. Flow voids are preserved. Tiny focus of chronic hemorrhage on susceptibility weighted imaging RIGHT caudate, nonacute.  No midline abnormality. Significant LEFT frontal sinus disease, with air-fluid level also noted in the LEFT maxillary sinus. Negative orbits. Negative mastoids.  IMPRESSION: Findings consistent with bacterial meningitis, cerebritis, and scattered microabscesses. Delineation of the extent of disease difficult due to patient motion.  A call is in to the ordering provider.   Electronically Signed   By: [name]  M.D.   On: 12/24/2013 20:22      Assessment/Plan:  Active Problems:   Thrombocytopenia   Meningitis due to bacterium   Acute renal failure syndrome   Dehydration   Sepsis due to group B Streptococcus   LFT elevation   Meningitis, streptococcal   Headache   Pain in joint, lower leg    [name] is a 28 y.o. 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 Luciana [name] is covering this weekend     LOS: 2 days   Acey LavCornelius Van Dam 12/25/2013, 2:24 PM

## 2013-12-26 DIAGNOSIS — I359 Nonrheumatic aortic valve disorder, unspecified: Secondary | ICD-10-CM

## 2013-12-26 LAB — COMPREHENSIVE METABOLIC PANEL
ALT: 37 U/L (ref 0–53)
AST: 34 U/L (ref 0–37)
Albumin: 2 g/dL — ABNORMAL LOW (ref 3.5–5.2)
Alkaline Phosphatase: 73 U/L (ref 39–117)
Anion gap: 15 (ref 5–15)
BILIRUBIN TOTAL: 1.3 mg/dL — AB (ref 0.3–1.2)
BUN: 32 mg/dL — ABNORMAL HIGH (ref 6–23)
CHLORIDE: 105 meq/L (ref 96–112)
CO2: 17 mEq/L — ABNORMAL LOW (ref 19–32)
Calcium: 7.4 mg/dL — ABNORMAL LOW (ref 8.4–10.5)
Creatinine, Ser: 1.12 mg/dL (ref 0.50–1.35)
GFR calc Af Amer: >90 mL/min (ref >90)
GFR calc non Af Amer: 89 mL/min — ABNORMAL LOW (ref >90)
Glucose, Bld: 158 mg/dL — ABNORMAL HIGH (ref 70–99)
POTASSIUM: 4.3 meq/L (ref 3.7–5.3)
SODIUM: 137 meq/L (ref 137–147)
Total Protein: 4.5 g/dL — ABNORMAL LOW (ref 6.0–8.3)

## 2013-12-26 LAB — CBC
HCT: 33.7 % — ABNORMAL LOW (ref 39.0–52.0)
HEMOGLOBIN: 11.8 g/dL — AB (ref 13.0–17.0)
MCH: 29.9 pg (ref 26.0–34.0)
MCHC: 35 g/dL (ref 30.0–36.0)
MCV: 85.3 fL (ref 78.0–100.0)
Platelets: 73 10*3/uL — ABNORMAL LOW (ref 150–400)
RBC: 3.95 MIL/uL — ABNORMAL LOW (ref 4.22–5.81)
RDW: 13.7 % (ref 11.5–15.5)
WBC: 15.8 10*3/uL — ABNORMAL HIGH (ref 4.0–10.5)

## 2013-12-26 LAB — PROTIME-INR
INR: 1.28 (ref 0.00–1.49)
PROTHROMBIN TIME: 16.1 s — AB (ref 11.6–15.2)

## 2013-12-26 MED ORDER — TRAMADOL HCL 50 MG PO TABS
50.0000 mg | ORAL_TABLET | Freq: Four times a day (QID) | ORAL | Status: DC | PRN
  Administered 2013-12-26 – 2013-12-27 (×4): 50 mg via ORAL
  Filled 2013-12-26 (×4): qty 1

## 2013-12-26 NOTE — Progress Notes (Signed)
[place] TEAM 1 - Stepdown/ICU TEAM Progress Note  [name] ZOX:[id number] DOB: 09-14-1985 DOA: 12/23/2013 PCP: No primary care provider on file.  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 The patient's symptoms started about 5 days prior when he developed chills, rigors, headache, fever, neck stiffness, nausea, and vomiting. He was seen at an Urgent Care and diagnosed with the flu. Went to the ED the following day and was diagnosed with abnormal liver function and thrombocytopenia. Had a CT scan of the abdomen which was negative. Subsequently followed up with his PCP and was sent back to the ER for suspected meningitis.  In the ED lumbar puncture noted elevated CSF WBC count. Patient was tachycardic in the 140s, w/ platelet count of 9, creatinine 1.9.  HPI/Subjective: Remains somewhat agitated, but singif less so than yesterday.  No longer threatening to leave.  C/o ongoing diffuse HA, nausea, intermittent vomiting, and intermittent hiccups.    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 sepsis -CSF culture positive GROUP B STREP -ID following  -HIV antibody negative -Continue ceftriaxone at high dose per ID  -brain MRI confirms bacterial meningitis, cerebritis, and scattered microabscesses  -TTE pending - will need eventual TEE unless clear cut evidence of SBE on TTE   Acute renal failure likely ATN -crt has essentiallyk normalized/ w/ GFR >90  Thrombocytopenia -secondary to DIC - plt count climbing rapidly now  -currently patient has no signs or symptoms of bleeding  Leg pain, with calf muscle tenderness -doppler negative for DVT - CK normal   Code Status: FULL Family Communication: spoke w/ girlfriend (common law wife) at bedside  Disposition Plan: SDU   Consultants: ID  Procedure/Significant Events: 11/11  LP 11/12 B LE venous duplex negative for DVT 11/14 TTE - pending   Antibiotics: Acyclovir 11/11>11/12 Vancomycin 11/11>11/12 Ceftriaxone 11/11 >  DVT prophylaxis: SCD  Objective: Blood pressure 109/43, pulse 103, temperature 98.4 F (36.9 C), temperature source Oral, resp. rate 20, height 5\' 5"  (1.651 m), weight 98.204 kg (216 lb 8 oz), SpO2 96 %.  Intake/Output Summary (Last 24 hours) at 12/26/13 1502 Last data filed at 12/26/13 1400  Gross per 24 hour  Intake 2252.92 ml  Output      0 ml  Net 2252.92 ml   Exam: General: oriented and alert - less agitated - no acute respiratory distress Lungs: clear to auscultation bilaterally without wheezes or crackles Cardiovascular: tachycardic, regular rhythm - negative gallop or rub Abdomen: Nontender, nondistended, soft, bowel sounds positive, no rebound, no ascites, no appreciable mass Extremities: no signif C/C/E B LE    Data Reviewed: Basic Metabolic Panel:  Recent Labs Lab 12/23/13 1515 12/24/13 0503 12/25/13 0315 12/25/13 1309 12/26/13 0346  NA 140 140 138 137 137  K 3.7 4.0 4.4 4.3 4.3  CL 103 105 104 105 105  CO2 23 20 20 19  17*  GLUCOSE 151* 158* 176* 163* 158*  BUN 34* 44* 40* 40* 32*  CREATININE 1.60* 1.48* 1.30 1.22 1.12  CALCIUM 7.9* 7.6* 7.8* 7.7* 7.4*  MG  --   --  3.2*  --   --    Liver Function Tests:  Recent Labs Lab 12/23/13 1125 12/24/13 0503 12/25/13 0315 12/26/13 0346  AST 52* 41* 29 34  ALT 49 37 32 37  ALKPHOS 193* 95 69 73  BILITOT 6.3* 4.4* 2.1* 1.3*  PROT 6.2 4.8*  4.8* 4.5*  ALBUMIN 3.0* 2.3* 2.1* 2.0*   CBC:  Recent Labs Lab 12/23/13 1125 12/23/13 1815 12/24/13 0503 12/25/13 0315 12/26/13 0346  WBC 10.3  --  14.5* 12.2* 15.8*  NEUTROABS 9.8*  --   --  9.1*  --   HGB 16.4  --  13.7 14.4 11.8*  HCT 46.3  --  38.4* 41.3 33.7*  MCV 83.4  --  82.6 84.1 85.3  PLT 9* 10* 17* 24* 73*   Cardiac Enzymes:  Recent Labs Lab 12/23/13 1815 12/23/13 2315 12/24/13 0503  CKTOTAL  66  --   --   CKMB <1.0  --   --   TROPONINI <0.30 <0.30 <0.30     Recent Results (from the past 240 hour(s))  CSF culture     Status: None   Collection Time: 12/23/13 12:20 PM  Result Value Ref Range Status   Specimen Description CSF  Final   Special Requests NONE  Final   Gram Stain   Final    CYOSPIN NO WBC SEEN NO ORGANISMS SEEN Performed at [facility]    Culture   Final    FEW GROUP B STREP(S.AGALACTIAE)ISOLATED Note: CRITICAL RESULT CALLED TO, READ BACK BY AND VERIFIED WITH: STEPHANIE SHAW @ 0744 ON [id number] BY [facility] Performed at [facility]    Report Status 12/25/2013 FINAL  Final   Organism ID, Bacteria GROUP B STREP(S.AGALACTIAE)ISOLATED  Final      Susceptibility   Group b strep(s.agalactiae)isolated - MIC*    ERYTHROMYCIN >=8 RESISTANT Resistant     PENICILLIN <=0.06 SENSITIVE Sensitive     VANCOMYCIN 0.5 SENSITIVE Sensitive     AMPICILLIN <=0.25 SENSITIVE Sensitive     * FEW GROUP B STREP(S.AGALACTIAE)ISOLATED  MRSA PCR Screening     Status: None   Collection Time: 12/23/13  5:27 PM  Result Value Ref Range Status   MRSA by PCR NEGATIVE NEGATIVE Final    Comment:        The GeneXpert MRSA Assay (FDA approved for NASAL specimens only), is one component of a comprehensive MRSA colonization surveillance program. It is not intended to diagnose MRSA infection nor to guide or monitor treatment for MRSA infections.   Culture, blood (routine x 2)     Status: None (Preliminary result)   Collection Time: 12/24/13 10:50 AM  Result Value Ref Range Status   Specimen Description BLOOD RIGHT HAND  Final   Special Requests   Final    BOTTLES DRAWN AEROBIC AND ANAEROBIC 6CC AER 5CC ANA   Culture  Setup Time   Final    12/24/2013 18:48 Performed at [facility]    Culture   Final           BLOOD CULTURE RECEIVED NO GROWTH TO DATE CULTURE WILL BE HELD FOR 5 DAYS BEFORE ISSUING A FINAL NEGATIVE REPORT Performed at [facility]     Report Status PENDING  Incomplete  Culture, blood (routine x 2)     Status: None (Preliminary result)   Collection Time: 12/24/13 10:58 AM  Result Value Ref Range Status   Specimen Description BLOOD LEFT ANTECUBITAL  Final   Special Requests BOTTLES DRAWN AEROBIC AND ANAEROBIC 10CC  Final   Culture  Setup Time   Final    12/24/2013 18:48 Performed at [facility]    Culture   Final           BLOOD CULTURE RECEIVED NO GROWTH TO DATE CULTURE WILL BE HELD FOR  5 DAYS BEFORE ISSUING A FINAL NEGATIVE REPORT Performed at [facility]    Report Status PENDING  Incomplete     Studies:  Recent x-ray studies have been reviewed in detail by the Attending Physician  Scheduled Meds:  Scheduled Meds: . cefTRIAXone (ROCEPHIN)  IV  2 g Intravenous Q12H    Time spent on care of this patient: 35 mins  Lonia Blood, MD [facility] For Consults/Admissions - Flow Manager - [phone] Office  [phone] Pager [phone]  On-Call/Text Page:      [email or web address]      password [email or web address]  12/26/2013, 3:02 PM   LOS: 3 days

## 2013-12-26 NOTE — Progress Notes (Signed)
  Echocardiogram 2D Echocardiogram has been performed.  [name] 12/26/2013, 11:50 AM

## 2013-12-27 DIAGNOSIS — G009 Bacterial meningitis, unspecified: Secondary | ICD-10-CM

## 2013-12-27 LAB — RENAL FUNCTION PANEL
Albumin: 2 g/dL — ABNORMAL LOW (ref 3.5–5.2)
Anion gap: 14 (ref 5–15)
BUN: 20 mg/dL (ref 6–23)
CALCIUM: 7.6 mg/dL — AB (ref 8.4–10.5)
CO2: 20 mEq/L (ref 19–32)
CREATININE: 1.34 mg/dL (ref 0.50–1.35)
Chloride: 107 mEq/L (ref 96–112)
GFR calc Af Amer: 83 mL/min — ABNORMAL LOW (ref >90)
GFR calc non Af Amer: 72 mL/min — ABNORMAL LOW (ref >90)
Glucose, Bld: 93 mg/dL (ref 70–99)
PHOSPHORUS: 4.2 mg/dL (ref 2.3–4.6)
Potassium: 4.2 mEq/L (ref 3.7–5.3)
Sodium: 141 mEq/L (ref 137–147)

## 2013-12-27 LAB — CBC
HCT: 36.3 % — ABNORMAL LOW (ref 39.0–52.0)
Hemoglobin: 12.4 g/dL — ABNORMAL LOW (ref 13.0–17.0)
MCH: 29.7 pg (ref 26.0–34.0)
MCHC: 34.2 g/dL (ref 30.0–36.0)
MCV: 86.8 fL (ref 78.0–100.0)
PLATELETS: 85 10*3/uL — AB (ref 150–400)
RBC: 4.18 MIL/uL — AB (ref 4.22–5.81)
RDW: 13.6 % (ref 11.5–15.5)
WBC: 22.4 10*3/uL — ABNORMAL HIGH (ref 4.0–10.5)

## 2013-12-27 MED ORDER — IBUPROFEN 400 MG PO TABS
400.0000 mg | ORAL_TABLET | Freq: Four times a day (QID) | ORAL | Status: DC | PRN
Start: 2013-12-27 — End: 2013-12-28
  Administered 2013-12-27 – 2013-12-28 (×2): 400 mg via ORAL
  Filled 2013-12-27 (×3): qty 1

## 2013-12-27 MED ORDER — ACETAMINOPHEN 10 MG/ML IV SOLN
1000.0000 mg | Freq: Once | INTRAVENOUS | Status: AC
  Administered 2013-12-27: 1000 mg via INTRAVENOUS
  Filled 2013-12-27 (×2): qty 100

## 2013-12-27 MED ORDER — SODIUM CHLORIDE 0.9 % IV SOLN: 400.0000 mg | Freq: Once | INTRAVENOUS | Status: DC

## 2013-12-27 NOTE — Progress Notes (Signed)
[place] TEAM 1 - Stepdown/ICU TEAM Progress Note  [name] PZW:[id number] DOB: 1985/09/01 DOA: 12/23/2013 PCP: No primary care provider on file.  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 The patient's symptoms started about 5 days prior when he developed chills, rigors, headache, fever, neck stiffness, nausea, and vomiting. He was seen at an Urgent Care and diagnosed with the flu. Went to the ED the following day and was diagnosed with abnormal liver function and thrombocytopenia. Had a CT scan of the abdomen which was negative. Subsequently followed up with his PCP and was sent back to the ER for suspected meningitis.  In the ED lumbar puncture noted elevated CSF WBC count. Patient was tachycardic in the 140s, w/ platelet count of 9, creatinine 1.9.  HPI/Subjective: Much less agitated today.  Denies cp or sob.  Has developed pain in his R shoulder, worse w/ movement.  Feels he "slept on it wrong."   Modest HA, with nausea and occasional vomiting.    Assessment/Plan:  Group B streptococcal meningitis with scattered brain microabscesses and sepsis - probable endocarditis - possible septic arthritis  -CSF culture positive GROUP B STREP -ID following  -HIV antibody negative -Continue ceftriaxone at high dose per ID  -brain MRI confirms bacterial meningitis, cerebritis, and scattered microabscesses  -TTE raises concern of tricuspid vegetation, and even possible aortic valve vegetation vs/ bicuspid valve - will need TEE once plts improved -shoulder exam w/o findings to suggest septic joint, but if pain persists will need MRI of shoulder  -climbing WBC is worrisome, though clinically pt is improving generally   Acute renal failure likely ATN -crt has essentially normalized - follow trend   Thrombocytopenia -secondary to DIC - plt count climbing  -currently patient has no  signs or symptoms of bleeding  Leg pain, with calf muscle tenderness -doppler negative for DVT - CK normal   Code Status: FULL Family Communication: spoke w/ girlfriend (common law wife) and MIL at bedside at length  Disposition Plan: SDU - still meets sepsis criteria   Consultants: ID  Procedure/Significant Events: 11/11 LP 11/12 B LE venous duplex negative for DVT 11/14 TTE - possible tricuspid vegetation - possible aortic vegetation vs/ bicuspid valve   Antibiotics: Acyclovir 11/11>11/12 Vancomycin 11/11>11/12 Ceftriaxone 11/11 >  DVT prophylaxis: SCD  Objective: Blood pressure 113/74, pulse 110, temperature 97.8 F (36.6 C), temperature source Oral, resp. rate 19, height 5\' 5"  (1.651 m), weight 96.212 kg (212 lb 1.7 oz), SpO2 99 %.  Intake/Output Summary (Last 24 hours) at 12/27/13 1423 Last data filed at 12/27/13 1100  Gross per 24 hour  Intake 1705.83 ml  Output    950 ml  Net 755.83 ml   Exam: General: oriented and alert - no acute respiratory distress Lungs: clear to auscultation bilaterally without wheezes or crackles Cardiovascular: tachycardic, regular rhythm - negative gallop or rub - 2/6 systolic M Abdomen: nontender, nondistended, soft, bowel sounds positive, no rebound, no ascites, no appreciable mass Extremities: no signif C/C/E B LE - R shoulder w/o calor, erythema, or appreciable effusion    Data Reviewed: Basic Metabolic Panel:  Recent Labs Lab 12/24/13 0503 12/25/13 0315 12/25/13 1309 12/26/13 0346 12/27/13 0436  NA 140 138 137 137 141  K 4.0 4.4 4.3 4.3 4.2  CL 105 104 105 105 107  CO2 20 20 19  17* 20  GLUCOSE 158* 176* 163* 158* 93  BUN  44* 40* 40* 32* 20  CREATININE 1.48* 1.30 1.22 1.12 1.34  CALCIUM 7.6* 7.8* 7.7* 7.4* 7.6*  MG  --  3.2*  --   --   --   PHOS  --   --   --   --  4.2   Liver Function Tests:  Recent Labs Lab 12/23/13 1125 12/24/13 0503 12/25/13 0315 12/26/13 0346 12/27/13 0436  AST 52* 41* 29 34  --   ALT  49 37 32 37  --   ALKPHOS 193* 95 69 73  --   BILITOT 6.3* 4.4* 2.1* 1.3*  --   PROT 6.2 4.8* 4.8* 4.5*  --   ALBUMIN 3.0* 2.3* 2.1* 2.0* 2.0*   CBC:  Recent Labs Lab 12/23/13 1125 12/23/13 1815 12/24/13 0503 12/25/13 0315 12/26/13 0346 12/27/13 0436  WBC 10.3  --  14.5* 12.2* 15.8* 22.4*  NEUTROABS 9.8*  --   --  9.1*  --   --   HGB 16.4  --  13.7 14.4 11.8* 12.4*  HCT 46.3  --  38.4* 41.3 33.7* 36.3*  MCV 83.4  --  82.6 84.1 85.3 86.8  PLT 9* 10* 17* 24* 73* 85*   Cardiac Enzymes:  Recent Labs Lab 12/23/13 1815 12/23/13 2315 12/24/13 0503  CKTOTAL 66  --   --   CKMB <1.0  --   --   TROPONINI <0.30 <0.30 <0.30     Recent Results (from the past 240 hour(s))  CSF culture     Status: None   Collection Time: 12/23/13 12:20 PM  Result Value Ref Range Status   Specimen Description CSF  Final   Special Requests NONE  Final   Gram Stain   Final    CYOSPIN NO WBC SEEN NO ORGANISMS SEEN Performed at [facility]    Culture   Final    FEW GROUP B STREP(S.AGALACTIAE)ISOLATED Note: CRITICAL RESULT CALLED TO, READ BACK BY AND VERIFIED WITH: STEPHANIE SHAW @ 0744 ON [id number] BY [facility] Performed at [facility]    Report Status 12/25/2013 FINAL  Final   Organism ID, Bacteria GROUP B STREP(S.AGALACTIAE)ISOLATED  Final      Susceptibility   Group b strep(s.agalactiae)isolated - MIC*    ERYTHROMYCIN >=8 RESISTANT Resistant     PENICILLIN <=0.06 SENSITIVE Sensitive     VANCOMYCIN 0.5 SENSITIVE Sensitive     AMPICILLIN <=0.25 SENSITIVE Sensitive     * FEW GROUP B STREP(S.AGALACTIAE)ISOLATED  MRSA PCR Screening     Status: None   Collection Time: 12/23/13  5:27 PM  Result Value Ref Range Status   MRSA by PCR NEGATIVE NEGATIVE Final    Comment:        The GeneXpert MRSA Assay (FDA approved for NASAL specimens only), is one component of a comprehensive MRSA colonization surveillance program. It is not intended to diagnose MRSA infection nor to guide  or monitor treatment for MRSA infections.   Culture, blood (routine x 2)     Status: None (Preliminary result)   Collection Time: 12/24/13 10:50 AM  Result Value Ref Range Status   Specimen Description BLOOD RIGHT HAND  Final   Special Requests   Final    BOTTLES DRAWN AEROBIC AND ANAEROBIC 6CC AER 5CC ANA   Culture  Setup Time   Final    12/24/2013 18:48 Performed at [facility]    Culture   Final           BLOOD CULTURE RECEIVED NO GROWTH TO DATE  CULTURE WILL BE HELD FOR 5 DAYS BEFORE ISSUING A FINAL NEGATIVE REPORT Performed at [facility]    Report Status PENDING  Incomplete  Culture, blood (routine x 2)     Status: None (Preliminary result)   Collection Time: 12/24/13 10:58 AM  Result Value Ref Range Status   Specimen Description BLOOD LEFT ANTECUBITAL  Final   Special Requests BOTTLES DRAWN AEROBIC AND ANAEROBIC 10CC  Final   Culture  Setup Time   Final    12/24/2013 18:48 Performed at [facility]    Culture   Final           BLOOD CULTURE RECEIVED NO GROWTH TO DATE CULTURE WILL BE HELD FOR 5 DAYS BEFORE ISSUING A FINAL NEGATIVE REPORT Performed at [facility]    Report Status PENDING  Incomplete     Studies:  Recent x-ray studies have been reviewed in detail by the Attending Physician  Scheduled Meds:  Scheduled Meds: . cefTRIAXone (ROCEPHIN)  IV  2 g Intravenous Q12H    Time spent on care of this patient: 35 mins  Lonia Blood, MD [facility] For Consults/Admissions - Flow Manager - [phone] Office  [phone] Pager [phone]  On-Call/Text Page:      [email or web address]      password [facility]  12/27/2013, 2:23 PM   LOS: 4 days

## 2013-12-27 NOTE — Progress Notes (Signed)
Patient complaints of right leg feeling and appearing swollen. Reddened area on posterior, medial right calf appears to be more red and is now itching, which is new.  He states that he is not having any pain in the leg at this time, but that "it just feels tight". Patient has an elevated temperature at this time.  Tylenol will be given and MD paged and notified.  Will continue to monitor.

## 2013-12-27 NOTE — Progress Notes (Addendum)
Pt has been sleepy, but easily arousable since initial assessment this shift.  At around 2300 pt awoke agitated and angry, argumentative about timing of pain meds and the frequency of staff providing care.  At this point, pt became nauseated and began vomiting and Zofran was given with good effect.  Ultram was given when pt able to tolerate po's. Pt remains agitated, getting OOB without assistance and disconnecting self from monitors.  When education provided, pt is defensive and continues to be argumentative, but is ultimately assisted to the restroom.  Pt is returned to safely to bed and within 5 minutes is incontinent of bowel. Stool is loose, but not watery.  Pt is cleaned and was anxious to return to sleep.  Now, appears to be resting comfortably, but is easily agitated by any care. Will continue to monitor. While there was no sitter available for the first part of shift, they were able to provide a sitter at 2300.  She is now at the bedside.

## 2013-12-27 NOTE — Progress Notes (Signed)
Upon initial assessment pt is alert and oriented x 4 and does not appear to be in any particular distress. He is sitting up on the side of the bed.  He is complaining of increased edema to right extremities.  Pt has 2+ pitting edema to Right lower extremity and non-pitting edema to Right upper extremity.  Previously reported Right shoulder pain has improved was 8/10, now reported as 3/10 with limited but much improved ROM.  Also previously reported itching to the Right lower extremity has resolved, as well. VS 97.8 oral, 108, RR 29, 199/39, 94% RA. Pt is ST with increases to the 120's with activity and RR is 27-33. Pt denies worsening SOB, but is winded with even minimal activity.  Notified T. Claiborne Billings NP of above information.  Orders received, will continue to monitor for change in pt condition.  Teaching has been done with both pt and girlfriend about the importance of reporting new or worsening symptoms.  Both express understanding. Pt appears to be sleeping comfortably now.

## 2013-12-27 NOTE — Progress Notes (Signed)
Patient vomited approximately 15-20 minutes after receiving Ibuprofen po.  MD paged and notified.

## 2013-12-28 ENCOUNTER — Inpatient Hospital Stay (HOSPITAL_COMMUNITY): Payer: BC Managed Care – PPO

## 2013-12-28 DIAGNOSIS — G042 Bacterial meningoencephalitis and meningomyelitis, not elsewhere classified: Secondary | ICD-10-CM

## 2013-12-28 DIAGNOSIS — R509 Fever, unspecified: Secondary | ICD-10-CM

## 2013-12-28 DIAGNOSIS — I359 Nonrheumatic aortic valve disorder, unspecified: Secondary | ICD-10-CM

## 2013-12-28 DIAGNOSIS — N179 Acute kidney failure, unspecified: Secondary | ICD-10-CM

## 2013-12-28 DIAGNOSIS — D72829 Elevated white blood cell count, unspecified: Secondary | ICD-10-CM

## 2013-12-28 DIAGNOSIS — R7989 Other specified abnormal findings of blood chemistry: Secondary | ICD-10-CM

## 2013-12-28 DIAGNOSIS — I079 Rheumatic tricuspid valve disease, unspecified: Secondary | ICD-10-CM

## 2013-12-28 LAB — DIFFERENTIAL
Basophils Absolute: 0 10*3/uL (ref 0.0–0.1)
Basophils Relative: 0 % (ref 0–1)
EOS PCT: 1 % (ref 0–5)
Eosinophils Absolute: 0.2 10*3/uL (ref 0.0–0.7)
LYMPHS ABS: 1.8 10*3/uL (ref 0.7–4.0)
LYMPHS PCT: 7 % — AB (ref 12–46)
Monocytes Absolute: 1.4 10*3/uL — ABNORMAL HIGH (ref 0.1–1.0)
Monocytes Relative: 5 % (ref 3–12)
Neutro Abs: 23.7 10*3/uL — ABNORMAL HIGH (ref 1.7–7.7)
Neutrophils Relative %: 87 % — ABNORMAL HIGH (ref 43–77)

## 2013-12-28 LAB — CBC
HCT: 35.6 % — ABNORMAL LOW (ref 39.0–52.0)
Hemoglobin: 12 g/dL — ABNORMAL LOW (ref 13.0–17.0)
MCH: 29.5 pg (ref 26.0–34.0)
MCHC: 33.7 g/dL (ref 30.0–36.0)
MCV: 87.5 fL (ref 78.0–100.0)
PLATELETS: 101 10*3/uL — AB (ref 150–400)
RBC: 4.07 MIL/uL — ABNORMAL LOW (ref 4.22–5.81)
RDW: 13.9 % (ref 11.5–15.5)
WBC: 32.3 10*3/uL — ABNORMAL HIGH (ref 4.0–10.5)

## 2013-12-28 LAB — COMPREHENSIVE METABOLIC PANEL
ALK PHOS: 97 U/L (ref 39–117)
ALT: 34 U/L (ref 0–53)
ANION GAP: 12 (ref 5–15)
AST: 25 U/L (ref 0–37)
Albumin: 1.9 g/dL — ABNORMAL LOW (ref 3.5–5.2)
BILIRUBIN TOTAL: 1.2 mg/dL (ref 0.3–1.2)
BUN: 17 mg/dL (ref 6–23)
CHLORIDE: 104 meq/L (ref 96–112)
CO2: 22 mEq/L (ref 19–32)
CREATININE: 1.6 mg/dL — AB (ref 0.50–1.35)
Calcium: 7.3 mg/dL — ABNORMAL LOW (ref 8.4–10.5)
GFR, EST AFRICAN AMERICAN: 67 mL/min — AB (ref >90)
GFR, EST NON AFRICAN AMERICAN: 58 mL/min — AB (ref >90)
GLUCOSE: 91 mg/dL (ref 70–99)
Potassium: 4 mEq/L (ref 3.7–5.3)
Sodium: 138 mEq/L (ref 137–147)
Total Protein: 4.8 g/dL — ABNORMAL LOW (ref 6.0–8.3)

## 2013-12-28 LAB — TROPONIN I
TROPONIN I: 0.37 ng/mL — AB (ref <0.30)
Troponin I: 0.49 ng/mL (ref <0.30)
Troponin I: 0.58 ng/mL (ref <0.30)

## 2013-12-28 MED ORDER — PROMETHAZINE HCL 25 MG/ML IJ SOLN
12.5000 mg | INTRAMUSCULAR | Status: AC
  Filled 2013-12-28: qty 1

## 2013-12-28 MED ORDER — HALOPERIDOL LACTATE 5 MG/ML IJ SOLN
3.0000 mg | Freq: Once | INTRAMUSCULAR | Status: AC
  Administered 2013-12-29: 3 mg via INTRAMUSCULAR
  Filled 2013-12-28: qty 1

## 2013-12-28 MED ORDER — LORAZEPAM 0.5 MG PO TABS: 0.5000 mg | ORAL_TABLET | Freq: Once | ORAL | Status: DC

## 2013-12-28 MED ORDER — METOPROLOL TARTRATE 1 MG/ML IV SOLN
INTRAVENOUS | Status: AC
  Administered 2013-12-28: 5 mg
  Filled 2013-12-28: qty 5

## 2013-12-28 MED ORDER — HALOPERIDOL LACTATE 5 MG/ML IJ SOLN: 3.0000 mg | Freq: Four times a day (QID) | INTRAMUSCULAR | Status: DC | PRN

## 2013-12-28 MED ORDER — METOPROLOL TARTRATE 1 MG/ML IV SOLN
5.0000 mg | Freq: Once | INTRAVENOUS | Status: AC
  Filled 2013-12-28: qty 5

## 2013-12-28 MED ORDER — BOOST / RESOURCE BREEZE PO LIQD
1.0000 | Freq: Three times a day (TID) | ORAL | Status: DC
  Administered 2013-12-28 – 2013-12-29 (×3): 1 via ORAL

## 2013-12-28 NOTE — Progress Notes (Addendum)
[facility]    Date of Admission:  12/23/2013   Total days of antibiotics 6        Day 6 ceftriaxone 2gm iv q12hr           ID: [name] is a 28 y.o. 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 Denies back pain. Nor any joint pain  Medications:  . cefTRIAXone (ROCEPHIN)  IV  2 g Intravenous Q12H  . feeding supplement (RESOURCE BREEZE)  1 Container Oral TID BM    Objective: Vital signs in last 24 hours: Temp:  [97.7 F (36.5 C)-100.8 F (38.2 C)] 98.5 F (36.9 C) (11/16 1456) Pulse Rate:  [42-118] 118 (11/16 1456) Resp:  [15-46] 46 (11/16 1456) BP: (96-132)/(30-51) 112/51 mmHg (11/16 1456) SpO2:  [92 %-100 %] 98 % (11/16 1456) Weight:  [212 lb 15.4 oz (96.6 kg)] 212 lb 15.4 oz (96.6 kg) (11/16 0500) Physical Exam  Constitutional: He is oriented to person, place, and time. He appears well-developed and well-nourished. No mild discomfort .  HENT:  Mouth/Throat: Oropharynx is clear and moist. No oropharyngeal exudate.  Cardiovascular: tachycardic, regular rhythm and normal heart sounds. Exam reveals no gallop and no friction rub.  No murmur heard.  Pulmonary/Chest: Effort normal and breath sounds normal. No respiratory distress. He has no wheezes.  Abdominal: Soft. Bowel sounds are normal. He exhibits no distension. There is no tenderness.  Lymphadenopathy:  He has no cervical adenopathy.  Neurological: He is alert and oriented to person, place, and time.  Skin: Skin is warm and dry. No rash noted. No erythema.  Psychiatric: He has a  normal mood and affect. His behavior is normal.    Lab Results  Recent Labs  12/27/13 0436 12/28/13 0320  WBC 22.4* 32.3*  HGB 12.4* 12.0*  HCT 36.3* 35.6*  NA 141 138  K 4.2 4.0  CL 107 104  CO2 20 22  BUN 20 17  CREATININE 1.34 1.60*   Liver Panel  Recent Labs  12/26/13 0346 12/27/13 0436 12/28/13 0320  PROT 4.5*  --  4.8*  ALBUMIN 2.0* 2.0* 1.9*  AST 34  --  25  ALT 37  --  34  ALKPHOS 73  --  97  BILITOT 1.3*  --  1.2    Microbiology: 11/12 blood culture ngtd 11/ 11 csf culture: group b strep 11/16 blood cx pending Studies/Results: Dg Chest Port 1 View  12/28/2013   CLINICAL DATA:  Shortness of breath.  EXAM: PORTABLE CHEST - 1 VIEW  COMPARISON:  None.  FINDINGS: Shallow inspiration. The heart size and mediastinal contours are within normal limits. Both lungs are clear. The visualized skeletal structures are unremarkable.  IMPRESSION: No active disease.   Electronically Signed   By: Burman NievesWilliam  Stevens M.D.   On: 12/28/2013 01:45     Assessment/Plan: 28yo M with group b streptococcus disseminated infection having new onset of fever yesterday and worsening leukocytosis - concern for uncontrolled source, recommend imaging of shoulder if he reports pain. May need to image spine/chest if  fever or leukocytosis not improved - recommend to get TEE still for better evaluation of AV? See if vegetations in addition to findings of TV - agree with imaging of leg to rule out dvt  - would have low threshold to test for cdifficile if he starts having diarrhea  Thrombocytopenia = improving  Acute kidney injury = could be due to insensible loss, most likely pre-renal  Drue Second [facility] Cell: [phone] Pager: [phone]  12/28/2013, 5:34 PM

## 2013-12-28 NOTE — Progress Notes (Signed)
[facility] progress note. Chief complaint. Tachycardia, dyspnea, anxiety. This 28 year old male in hospital with group B streptococcal meningitis, acute renal failure, thrombocytopenia, etc. He has no known cardiac history. Staff called me to the floor when they noted patient to be tachycardic in the 140-150 range and dyspneic with a respiratory rate in the 40s. I came rapidly to the patient's bedside and found him as described. A 12-lead EKG was obtained and indicated SVT. The patient was treated with 5 mg of metoprolol and his rate decreased to 110-115 range. A repeat EKG was obtained and this indicated sinus tach.with first-degree AV block.The patient denies chest pain. He did acknowledge dyspnea. He did acknowledge anxiety. Vital signs.temperature 100.8 pulse initially 140s but post beta blocker 115. Weight., respiration 42, blood pressure 111/33, O2 sats 93% on room air. General appearance. Well-developed male who is alert with obvious increased respirations and tremulous. Cardiac. Regular and tachycardic. No jugular venous distention, no peripheral edema. Lungs. Breath sounds clear and equal. Abdomen. Soft with positive bowel sounds. No pain. Impression/plan. Problem #1. SVT.patient on no prior cardioactive medications. No prior cardiac history. This appears resolved status post 5 mg metoprolol administration. I suspect anxiety played a factor here. I don't see evidence of acute ischemia per EKG but I will obtain troponin now and then every 6 hours for total of 3 sets. Defer cardiology consult to the discretion of the rounding physician. Repeat EKG in 6 hours to evaluate for any changes.

## 2013-12-28 NOTE — Significant Event (Signed)
Rapid Response Event Note Called per floor RN for pt with increased HR 150-160  And RR 40s.  Overview: Time Called: 0105 Arrival Time: 0110 Event Type: Respiratory, Cardiac  Initial Focused Assessment: Pt found diaphoretic, anxious lying in bed with several blanket with hob 45 degrees. HR 160s, bp 111/35,rr 40s. Lungs course but clear. Pt denies pain, but complains of difficulty catching breath.Vomited about 250 cc of clear bilious fluid.   Interventions: Most of his blankets removed from bed and thermostat turned down. Lenny Pastel NP paged to bedside. EKG completed at 0120 revealing at SVT 160s. Lopressor 5mg  given as well as 2mg  Haldol and 12.5 phenergan. EKG repeated at 0140 yielding ST 110-120 with 1st degree block. Per RN this is his baseline rhythm. At 0150 pt anxiety improved, RR 20s, hr 117, bp 115/30, resting in bed eyes closed. RN to monitor closely, cardiac labs and repeat EKG ordered per Benedetto Coons NP  Event Summary: Name of Physician Notified: Lenny Pastel NP [facility] at 0110    at    Outcome: Stayed in room and stabalized     [name], [name]

## 2013-12-28 NOTE — Progress Notes (Signed)
VASCULAR LAB PRELIMINARY  PRELIMINARY  PRELIMINARY  PRELIMINARY  Bilateral lower extremity venous duplex  completed.    Preliminary report:  Bilateral:  No evidence of DVT, superficial thrombosis, or Baker's Cyst.    Dontrell Stuck, RVT 12/28/2013, 3:52 PM

## 2013-12-28 NOTE — Progress Notes (Signed)
[name] - Stepdown/ICU TEAM Progress Note  Albert CousinHayden [name] AVW:[id number] DOB: 10/03/1985 DOA: 12/23/2013 PCP: No primary care provider on file.  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 The patient's symptoms started about 5 days prior when he developed chills, rigors, headache, fever, neck stiffness, nausea, and vomiting. He was seen at an Urgent Care and diagnosed with the flu. Went to the ED the following day and was diagnosed with abnormal liver function and thrombocytopenia. Had a CT scan of the abdomen which was negative. Subsequently followed up with his PCP and was sent back to the ER for suspected meningitis.  In the ED lumbar puncture noted elevated CSF WBC count. Patient was tachycardic in the 140s, w/ platelet count of 9, creatinine [name].9.  DIC physiology has improved as well as pt's overall mentation. His platelets have risen to >100,000 but his white count has also risen to > 30,000. He had an episode of dyspnea, tachypnea, CP and tachycardia. CXR was negative. Pt with mild LE edema so lower extremity duplex ordered.   HPI/Subjective: Denied any pain including shoulder today, but feels "bad in general." Events of last evening reviewed; discussed with pt and significant other as well. Poor appetite.  Assessment/Plan:  Group B streptococcal meningitis with scattered brain microabscesses and sepsis - probable endocarditis - possible septic arthritis  -CSF culture positive GROUP B STREP-ID following/managing Rocephin-HIV antibody negative-brain MRI confirms bacterial meningitis, cerebritis, and scattered microabscesses -TTE raises concern of tricuspid vegetation, and even possible aortic valve vegetation vs/ bicuspid valve - will need TEE once plts improved-11/15 shoulder exam w/o findings to suggest septic joint, but if pain persists will need MRI of shoulder-climbing  WBC is worrisome, though clinically pt is improving generally -follow closely  Bilateral lower extremity edema Edema is minimal but given constellation of sx's overnight (dyspnea, tachypnea, CP, tachycardia with neg CXR) ck LE duplex (11/12 WAS NEGATIVE)- d dimer unreliable given recent DIC-cant do CT chest to r/o PE presently as renal fxn is worsening   Acute renal failure likely ATN -SUBTLE INCREASE in crt PAST 24 HOURS - follow trend - avoid contrast exposure or NSAIDs at this time   Thrombocytopenia -secondary to DIC - plt count climbing -currently patient has no signs or symptoms of bleeding  Leg pain, with calf muscle tenderness -doppler negative for DVT - CK normal   Code Status: FULL Family Communication: spoke w/ girlfriend (common law wife) and mother at bedside at length  Disposition Plan: SDU  Consultants: ID  Procedure/Significant Events: 11/11 LP 11/12 B LE venous duplex negative for DVT 11/14 TTE - possible tricuspid vegetation - possible aortic vegetation vs/ bicuspid valve   Antibiotics: Acyclovir 11/11>11/12 Vancomycin 11/11>11/12 Ceftriaxone 11/11 >  DVT prophylaxis: SCD-pt counseled to wear   Objective: Blood pressure 132/49, pulse 115, temperature 97.7 F (36.5 C), temperature source Axillary, resp. rate 25, height 5\' 5"  ([name].651 m), weight 212 lb 15.4 oz (96.6 kg), SpO2 96 %.  Intake/Output Summary (Last 24 hours) at 12/28/13 1254 Last data filed at 12/28/13 0700  Gross per 24 hour  Intake 2212.92 ml  Output    950 ml  Net 1262.92 ml   Exam: General: No acute respiratory distress Lungs: clear to auscultation bilaterally without wheezes or crackles, RA Cardiovascular: minimal tachycardic, regular rhythm - negative gallop or rub - 2/6 systolic M Abdomen: nontender, nondistended, soft, bowel sounds positive, no rebound,  no ascites, no appreciable mass Extremities: mild LE edema B - B shoulder w/o erythema, or appreciable effusion    Data  Reviewed: Basic Metabolic Panel:  Recent Labs Lab 12/25/13 0315 12/25/13 1309 12/26/13 0346 12/27/13 0436 12/28/13 0320  NA 138 137 137 141 138  K 4.4 4.3 4.3 4.2 4.0  CL 104 105 105 107 104  CO2 20 19 17* 20 22  GLUCOSE 176* 163* 158* 93 91  BUN 40* 40* 32* 20 17  CREATININE [name].30 [name].22 [name].12 [name].34 [name].60*  CALCIUM 7.8* 7.7* 7.4* 7.6* 7.3*  MG 3.2*  --   --   --   --   PHOS  --   --   --  4.2  --    Liver Function Tests:  Recent Labs Lab 12/23/13 1125 12/24/13 0503 12/25/13 0315 12/26/13 0346 12/27/13 0436 12/28/13 0320  AST 52* 41* 29 34  --  25  ALT 49 37 32 37  --  34  ALKPHOS 193* 95 69 73  --  97  BILITOT 6.3* 4.4* 2.[name]* [name].3*  --  [name].2  PROT 6.2 4.8* 4.8* 4.5*  --  4.8*  ALBUMIN 3.0* 2.3* 2.[name]* 2.0* 2.0* [name].9*   CBC:  Recent Labs Lab 12/23/13 1125  12/24/13 0503 12/25/13 0315 12/26/13 0346 12/27/13 0436 12/28/13 0320  WBC 10.3  --  14.5* 12.2* 15.8* 22.4* 32.3*  NEUTROABS 9.8*  --   --  9.[name]*  --   --   --   HGB 16.4  --  13.7 14.4 11.8* 12.4* 12.0*  HCT 46.3  --  38.4* 41.3 33.7* 36.3* 35.6*  MCV 83.4  --  82.6 84.[name] 85.3 86.8 87.5  PLT 9*  < > 17* 24* 73* 85* 101*  < > = values in this interval not displayed. Cardiac Enzymes:  Recent Labs Lab 12/23/13 1815 12/23/13 2315 12/24/13 0503 12/28/13 0320 12/28/13 0726  CKTOTAL 66  --   --   --   --   CKMB <[name].0  --   --   --   --   TROPONINI <0.30 <0.30 <0.30 0.37* 0.49*     Recent Results (from the past 240 hour(s))  CSF culture     Status: None   Collection Time: 12/23/13 12:20 PM  Result Value Ref Range Status   Specimen Description CSF  Final   Special Requests NONE  Final   Gram Stain   Final    CYOSPIN NO WBC SEEN NO ORGANISMS SEEN Performed at [facility]    Culture   Final    FEW GROUP B STREP(S.AGALACTIAE)ISOLATED Note: CRITICAL RESULT CALLED TO, READ BACK BY AND VERIFIED WITH: STEPHANIE SHAW @ 0744 ON [id number] BY [facility] Performed at [facility]    Report Status  12/25/2013 FINAL  Final   Organism ID, Bacteria GROUP B STREP(S.AGALACTIAE)ISOLATED  Final      Susceptibility   Group b strep(s.agalactiae)isolated - MIC*    ERYTHROMYCIN >=8 RESISTANT Resistant     PENICILLIN <=0.06 SENSITIVE Sensitive     VANCOMYCIN 0.5 SENSITIVE Sensitive     AMPICILLIN <=0.25 SENSITIVE Sensitive     * FEW GROUP B STREP(S.AGALACTIAE)ISOLATED  MRSA PCR Screening     Status: None   Collection Time: 12/23/13  5:27 PM  Result Value Ref Range Status   MRSA by PCR NEGATIVE NEGATIVE Final    Comment:        The GeneXpert MRSA Assay (FDA approved for NASAL specimens only), is one component of  a comprehensive MRSA colonization surveillance program. It is not intended to diagnose MRSA infection nor to guide or monitor treatment for MRSA infections.   Culture, blood (routine x 2)     Status: None (Preliminary result)   Collection Time: 12/24/13 10:50 AM  Result Value Ref Range Status   Specimen Description BLOOD RIGHT HAND  Final   Special Requests   Final    BOTTLES DRAWN AEROBIC AND ANAEROBIC 6CC AER 5CC ANA   Culture  Setup Time   Final    12/24/2013 18:48 Performed at [facility]    Culture   Final           BLOOD CULTURE RECEIVED NO GROWTH TO DATE CULTURE WILL BE HELD FOR 5 DAYS BEFORE ISSUING A FINAL NEGATIVE REPORT Performed at [facility]    Report Status PENDING  Incomplete  Culture, blood (routine x 2)     Status: None (Preliminary result)   Collection Time: 12/24/13 10:58 AM  Result Value Ref Range Status   Specimen Description BLOOD LEFT ANTECUBITAL  Final   Special Requests BOTTLES DRAWN AEROBIC AND ANAEROBIC 10CC  Final   Culture  Setup Time   Final    12/24/2013 18:48 Performed at [facility]    Culture   Final           BLOOD CULTURE RECEIVED NO GROWTH TO DATE CULTURE WILL BE HELD FOR 5 DAYS BEFORE ISSUING A FINAL NEGATIVE REPORT Performed at [facility]    Report Status PENDING  Incomplete      Studies:  Recent x-ray studies have been reviewed in detail by the Attending Physician  Scheduled Meds:  Scheduled Meds: . cefTRIAXone (ROCEPHIN)  IV  2 g Intravenous Q12H  . feeding supplement (RESOURCE BREEZE)  [name] Container Oral TID BM    Time spent on care of this patient: 35 mins  Junious Silk, ANP [facility] For Consults/Admissions - Flow Manager - [phone] Office  [phone] Pager [phone]  On-Call/Text Page:      [email or web address]      password [facility]  12/28/2013, 12:54 PM   LOS: 5 days   I have personally examined this patient and reviewed the entire database. I have reviewed the above note, made any necessary editorial changes, and agree with its content.  Lonia Blood, MD [facility]

## 2013-12-28 NOTE — Significant Event (Addendum)
CRITICAL VALUE ALERT  Critical value received:  Troponin 0.37  Date of notification:  12/28/2013  Time of notification:  0421  Critical value read back: yes  Nurse who received alert: Jeanmarie Plant RN  MD notified (1st page):  [facility] Pager  Time of first page:  0429  Responding MD:  Lenny Pastel NP  Time MD responded:  [phone] During this phone call, Mr. Claiborne Billings was also notified of the pt's labs, his increasing WBCs and creatinine.  Will continue to monitor.

## 2013-12-28 NOTE — Progress Notes (Signed)
At around 0050 RN went into room to fix leads and found pt standing at side of bed using the urinal. Pt was tachypnic, but did not appear to be in any distress at that time.  When leads were replaced pt heartrate was sustaining in the 130's.  Tech had just obtained temp of 98.7 and pt did not feel hot to the touch and had been having intervals of feeling hot and cold.  Pt was returned to bed, but instead of improving, the chills and shivering worsened and then so did the respiratory rate (30-40's) and the heartrate (135-150).  Initially, an attempt was made to warm the patient with no effect. At around 0100, the pt's heartrate appeared to be SVT with a rate sustaining 145-160.  Called Rapid Response to the bedside to evaluate and then called Lenny Pastel NP to notify him of change in pt condition.  At this time, a rectal temp was obtained.  At 0113, rectal temp was 100.8 and at 0116 Motrin prn was given.  Around this time, Rapid Response RN and T. Claiborne Billings arrived to floor to assess. Blankets removed.  Pt has intervals of vomiting, clear emesis with brown streaks. Pt was given Phenergan 12.5mg . Lopressor 5mg  iv was given at 0132 with good effect, pt quickly trending down and finally returning to 115-118.  Haldol 2mg  given as pt remained anxious/agitated, but not confused.  During this episode, 2 12- lead EKGs were obtained and reviewed by T. Claiborne Billings and RRRN, and confirmed an initial finding of SVT and then subsequent return to baseline ST with 1st degree HB.    Pt appears to be resting comfortably at this time. Heart rate is now 101, Respiratory rate remains 25-32, sats 96% on 2L. Pt was previously 100% on RA, will continue to monitor.

## 2013-12-29 ENCOUNTER — Inpatient Hospital Stay (HOSPITAL_COMMUNITY): Payer: BC Managed Care – PPO

## 2013-12-29 ENCOUNTER — Encounter (HOSPITAL_COMMUNITY): Payer: Self-pay | Admitting: Physician Assistant

## 2013-12-29 DIAGNOSIS — J189 Pneumonia, unspecified organism: Secondary | ICD-10-CM

## 2013-12-29 DIAGNOSIS — R740 Nonspecific elevation of levels of transaminase and lactic acid dehydrogenase [LDH]: Secondary | ICD-10-CM

## 2013-12-29 DIAGNOSIS — I341 Nonrheumatic mitral (valve) prolapse: Secondary | ICD-10-CM

## 2013-12-29 DIAGNOSIS — I330 Acute and subacute infective endocarditis: Secondary | ICD-10-CM

## 2013-12-29 DIAGNOSIS — G039 Meningitis, unspecified: Secondary | ICD-10-CM

## 2013-12-29 DIAGNOSIS — R0600 Dyspnea, unspecified: Secondary | ICD-10-CM

## 2013-12-29 DIAGNOSIS — J9601 Acute respiratory failure with hypoxia: Secondary | ICD-10-CM

## 2013-12-29 DIAGNOSIS — M2550 Pain in unspecified joint: Secondary | ICD-10-CM

## 2013-12-29 DIAGNOSIS — R600 Localized edema: Secondary | ICD-10-CM

## 2013-12-29 DIAGNOSIS — D7289 Other specified disorders of white blood cells: Secondary | ICD-10-CM

## 2013-12-29 DIAGNOSIS — I5021 Acute systolic (congestive) heart failure: Secondary | ICD-10-CM | POA: Insufficient documentation

## 2013-12-29 DIAGNOSIS — J9600 Acute respiratory failure, unspecified whether with hypoxia or hypercapnia: Secondary | ICD-10-CM

## 2013-12-29 DIAGNOSIS — J9621 Acute and chronic respiratory failure with hypoxia: Secondary | ICD-10-CM

## 2013-12-29 DIAGNOSIS — I351 Nonrheumatic aortic (valve) insufficiency: Secondary | ICD-10-CM

## 2013-12-29 DIAGNOSIS — G509 Disorder of trigeminal nerve, unspecified: Secondary | ICD-10-CM

## 2013-12-29 DIAGNOSIS — I339 Acute and subacute endocarditis, unspecified: Secondary | ICD-10-CM

## 2013-12-29 LAB — URINALYSIS, ROUTINE W REFLEX MICROSCOPIC
Bilirubin Urine: NEGATIVE
GLUCOSE, UA: NEGATIVE mg/dL
Hgb urine dipstick: NEGATIVE
Ketones, ur: NEGATIVE mg/dL
Leukocytes, UA: NEGATIVE
Nitrite: NEGATIVE
PROTEIN: NEGATIVE mg/dL
Specific Gravity, Urine: 1.02 (ref 1.005–1.030)
Urobilinogen, UA: 0.2 mg/dL (ref 0.0–1.0)
pH: 5.5 (ref 5.0–8.0)

## 2013-12-29 LAB — BLOOD GAS, ARTERIAL
Acid-base deficit: 3.1 mmol/L — ABNORMAL HIGH (ref 0.0–2.0)
Bicarbonate: 19.2 mEq/L — ABNORMAL LOW (ref 20.0–24.0)
DRAWN BY: 35135
FIO2: 0.21 %
O2 SAT: 91.3 %
PATIENT TEMPERATURE: 99.1
TCO2: 19.8 mmol/L (ref 0–100)
pCO2 arterial: 22.6 mmHg — ABNORMAL LOW (ref 35.0–45.0)
pH, Arterial: 7.539 — ABNORMAL HIGH (ref 7.350–7.450)
pO2, Arterial: 57.4 mmHg — ABNORMAL LOW (ref 80.0–100.0)

## 2013-12-29 LAB — CBC
HCT: 31.2 % — ABNORMAL LOW (ref 39.0–52.0)
Hemoglobin: 10.7 g/dL — ABNORMAL LOW (ref 13.0–17.0)
MCH: 30 pg (ref 26.0–34.0)
MCHC: 34.3 g/dL (ref 30.0–36.0)
MCV: 87.4 fL (ref 78.0–100.0)
Platelets: 167 10*3/uL (ref 150–400)
RBC: 3.57 MIL/uL — ABNORMAL LOW (ref 4.22–5.81)
RDW: 14 % (ref 11.5–15.5)
WBC: 24.6 10*3/uL — AB (ref 4.0–10.5)

## 2013-12-29 LAB — TRIGLYCERIDES: Triglycerides: 119 mg/dL (ref <150)

## 2013-12-29 LAB — COMPREHENSIVE METABOLIC PANEL
ALT: 60 U/L — ABNORMAL HIGH (ref 0–53)
ALT: 262 U/L — ABNORMAL HIGH (ref 0–53)
ANION GAP: 18 — AB (ref 5–15)
AST: 60 U/L — ABNORMAL HIGH (ref 0–37)
AST: 293 U/L — ABNORMAL HIGH (ref 0–37)
Albumin: 1.9 g/dL — ABNORMAL LOW (ref 3.5–5.2)
Albumin: 1.8 g/dL — ABNORMAL LOW (ref 3.5–5.2)
Alkaline Phosphatase: 75 U/L (ref 39–117)
Alkaline Phosphatase: 63 U/L (ref 39–117)
Anion gap: 13 (ref 5–15)
BILIRUBIN TOTAL: 0.8 mg/dL (ref 0.3–1.2)
BUN: 17 mg/dL (ref 6–23)
BUN: 25 mg/dL — ABNORMAL HIGH (ref 6–23)
CHLORIDE: 98 meq/L (ref 96–112)
CO2: 17 meq/L — AB (ref 19–32)
CO2: 17 mEq/L — ABNORMAL LOW (ref 19–32)
CREATININE: 1.47 mg/dL — AB (ref 0.50–1.35)
Calcium: 7.3 mg/dL — ABNORMAL LOW (ref 8.4–10.5)
Calcium: 7.3 mg/dL — ABNORMAL LOW (ref 8.4–10.5)
Chloride: 103 mEq/L (ref 96–112)
Creatinine, Ser: 1.87 mg/dL — ABNORMAL HIGH (ref 0.50–1.35)
GFR calc Af Amer: 75 mL/min — ABNORMAL LOW (ref >90)
GFR calc Af Amer: 56 mL/min — ABNORMAL LOW (ref >90)
GFR calc non Af Amer: 48 mL/min — ABNORMAL LOW (ref >90)
GFR, EST NON AFRICAN AMERICAN: 64 mL/min — AB (ref >90)
Glucose, Bld: 171 mg/dL — ABNORMAL HIGH (ref 70–99)
Glucose, Bld: 102 mg/dL — ABNORMAL HIGH (ref 70–99)
Potassium: 3.5 mEq/L — ABNORMAL LOW (ref 3.7–5.3)
Potassium: 4.8 mEq/L (ref 3.7–5.3)
Sodium: 133 mEq/L — ABNORMAL LOW (ref 137–147)
Sodium: 133 mEq/L — ABNORMAL LOW (ref 137–147)
Total Bilirubin: 1.1 mg/dL (ref 0.3–1.2)
Total Protein: 5.1 g/dL — ABNORMAL LOW (ref 6.0–8.3)
Total Protein: 4.9 g/dL — ABNORMAL LOW (ref 6.0–8.3)

## 2013-12-29 LAB — HEPARIN INDUCED THROMBOCYTOPENIA PNL
HEPARIN INDUCED PLT AB: NEGATIVE
PATIENT O. D.: 0.03
UFH High Dose UFH H: 0 % Release
UFH LOW DOSE 0.5 IU/ML: 0 %
UFH Low Dose 0.1 IU/mL: 0 % Release
UFH SRA Result: NEGATIVE

## 2013-12-29 LAB — PREPARE RBC (CROSSMATCH)

## 2013-12-29 LAB — POCT I-STAT 3, ART BLOOD GAS (G3+)
ACID-BASE DEFICIT: 5 mmol/L — AB (ref 0.0–2.0)
Bicarbonate: 21.1 mEq/L (ref 20.0–24.0)
O2 SAT: 100 %
PCO2 ART: 43.6 mmHg (ref 35.0–45.0)
Patient temperature: 102
TCO2: 22 mmol/L (ref 0–100)
pH, Arterial: 7.302 — ABNORMAL LOW (ref 7.350–7.450)
pO2, Arterial: 196 mmHg — ABNORMAL HIGH (ref 80.0–100.0)

## 2013-12-29 LAB — PROTIME-INR
INR: 1.84 — ABNORMAL HIGH (ref 0.00–1.49)
Prothrombin Time: 21.4 seconds — ABNORMAL HIGH (ref 11.6–15.2)

## 2013-12-29 LAB — APTT: aPTT: 36 seconds (ref 24–37)

## 2013-12-29 LAB — GLUCOSE, CAPILLARY
GLUCOSE-CAPILLARY: 191 mg/dL — AB (ref 70–99)
GLUCOSE-CAPILLARY: 91 mg/dL (ref 70–99)

## 2013-12-29 MED ORDER — PROPOFOL 10 MG/ML IV EMUL
0.0000 ug/kg/min | INTRAVENOUS | Status: DC
  Administered 2013-12-29: 15 ug/kg/min via INTRAVENOUS
  Administered 2013-12-29: 30 ug/kg/min via INTRAVENOUS
  Administered 2013-12-30: 35 ug/kg/min via INTRAVENOUS
  Administered 2013-12-30: 30 ug/kg/min via INTRAVENOUS
  Filled 2013-12-29 (×3): qty 100

## 2013-12-29 MED ORDER — PAPAVERINE HCL 30 MG/ML IJ SOLN
INTRAMUSCULAR | Status: AC
  Administered 2013-12-30: 500 mL
  Filled 2013-12-29: qty 2.5

## 2013-12-29 MED ORDER — PANTOPRAZOLE SODIUM 40 MG IV SOLR
40.0000 mg | INTRAVENOUS | Status: DC
  Administered 2013-12-29: 40 mg via INTRAVENOUS
  Filled 2013-12-29: qty 40

## 2013-12-29 MED ORDER — ETOMIDATE 2 MG/ML IV SOLN
40.0000 mg | Freq: Once | INTRAVENOUS | Status: AC
  Administered 2013-12-29: 40 mg via INTRAVENOUS

## 2013-12-29 MED ORDER — MIDAZOLAM HCL 2 MG/2ML IJ SOLN: 4.0000 mg | Freq: Once | INTRAMUSCULAR | Status: AC

## 2013-12-29 MED ORDER — VANCOMYCIN HCL 10 G IV SOLR
2000.0000 mg | Freq: Once | INTRAVENOUS | Status: DC
  Filled 2013-12-29: qty 2000

## 2013-12-29 MED ORDER — PROPOFOL 10 MG/ML IV EMUL
INTRAVENOUS | Status: AC
  Filled 2013-12-29: qty 100

## 2013-12-29 MED ORDER — VANCOMYCIN HCL 10 G IV SOLR
1500.0000 mg | Freq: Two times a day (BID) | INTRAVENOUS | Status: DC
  Filled 2013-12-29: qty 1500

## 2013-12-29 MED ORDER — SODIUM CHLORIDE 0.9 % IJ SOLN
3.0000 mL | Freq: Two times a day (BID) | INTRAMUSCULAR | Status: DC
  Administered 2013-12-29: 3 mL via INTRAVENOUS

## 2013-12-29 MED ORDER — CHLORHEXIDINE GLUCONATE 0.12 % MT SOLN
15.0000 mL | Freq: Two times a day (BID) | OROMUCOSAL | Status: DC
Start: 2013-12-29 — End: 2014-01-04
  Administered 2013-12-29 – 2014-01-04 (×11): 15 mL via OROMUCOSAL
  Filled 2013-12-29 (×10): qty 15

## 2013-12-29 MED ORDER — DEXTROSE 5 % IV SOLN
750.0000 mg | INTRAVENOUS | Status: DC
  Filled 2013-12-29 (×2): qty 750

## 2013-12-29 MED ORDER — SODIUM CHLORIDE 0.9 % IV SOLN
INTRAVENOUS | Status: AC
  Administered 2013-12-30: 1.2 [IU]/h via INTRAVENOUS
  Filled 2013-12-29: qty 2.5

## 2013-12-29 MED ORDER — EPINEPHRINE HCL 1 MG/ML IJ SOLN
0.0000 ug/min | INTRAVENOUS | Status: DC
  Filled 2013-12-29: qty 4

## 2013-12-29 MED ORDER — SODIUM CHLORIDE 0.9 % IV SOLN
1250.0000 mg | INTRAVENOUS | Status: AC
  Administered 2013-12-30: 1250 mg via INTRAVENOUS
  Filled 2013-12-29: qty 1250

## 2013-12-29 MED ORDER — SODIUM CHLORIDE 0.9 % IV SOLN
INTRAVENOUS | Status: AC
  Administered 2013-12-30: 69.8 mL/h via INTRAVENOUS
  Filled 2013-12-29: qty 40

## 2013-12-29 MED ORDER — CEFEPIME HCL 2 G IJ SOLR
2.0000 g | Freq: Two times a day (BID) | INTRAMUSCULAR | Status: DC
  Administered 2013-12-29 (×2): 2 g via INTRAVENOUS
  Filled 2013-12-29 (×4): qty 2

## 2013-12-29 MED ORDER — FUROSEMIDE 10 MG/ML IJ SOLN
INTRAMUSCULAR | Status: AC
  Filled 2013-12-29: qty 2

## 2013-12-29 MED ORDER — FENTANYL CITRATE 0.05 MG/ML IJ SOLN
200.0000 ug | Freq: Once | INTRAMUSCULAR | Status: AC
  Administered 2013-12-29: 200 ug via INTRAVENOUS

## 2013-12-29 MED ORDER — CETYLPYRIDINIUM CHLORIDE 0.05 % MT LIQD
7.0000 mL | Freq: Four times a day (QID) | OROMUCOSAL | Status: DC
  Administered 2013-12-30 – 2014-01-04 (×21): 7 mL via OROMUCOSAL

## 2013-12-29 MED ORDER — POTASSIUM CHLORIDE 2 MEQ/ML IV SOLN
80.0000 meq | INTRAVENOUS | Status: DC
  Filled 2013-12-29: qty 40

## 2013-12-29 MED ORDER — SODIUM CHLORIDE 0.9 % IV SOLN: INTRAVENOUS | Status: DC | PRN

## 2013-12-29 MED ORDER — MORPHINE SULFATE 2 MG/ML IJ SOLN
1.0000 mg | Freq: Once | INTRAMUSCULAR | Status: AC
  Administered 2013-12-29: 1 mg via INTRAVENOUS

## 2013-12-29 MED ORDER — SODIUM CHLORIDE 0.9 % IV SOLN
INTRAVENOUS | Status: DC
  Administered 2013-12-29: 16:00:00 via INTRAVENOUS

## 2013-12-29 MED ORDER — CHLORHEXIDINE GLUCONATE CLOTH 2 % EX PADS
6.0000 | MEDICATED_PAD | Freq: Once | CUTANEOUS | Status: AC
  Administered 2013-12-30: 6 via TOPICAL

## 2013-12-29 MED ORDER — MORPHINE SULFATE 2 MG/ML IJ SOLN
INTRAMUSCULAR | Status: AC
  Administered 2013-12-29: 2 mg via INTRAVASCULAR
  Filled 2013-12-29: qty 1

## 2013-12-29 MED ORDER — SODIUM CHLORIDE 0.9 % IJ SOLN
10.0000 mL | INTRAMUSCULAR | Status: DC | PRN
  Administered 2013-12-29: 10 mL via INTRAVENOUS

## 2013-12-29 MED ORDER — DEXMEDETOMIDINE HCL IN NACL 400 MCG/100ML IV SOLN
0.1000 ug/kg/h | INTRAVENOUS | Status: AC
  Administered 2013-12-30: .3 ug/kg/h via INTRAVENOUS
  Filled 2013-12-29: qty 100

## 2013-12-29 MED ORDER — VANCOMYCIN HCL 10 G IV SOLR
2000.0000 mg | Freq: Once | INTRAVENOUS | Status: AC
  Administered 2013-12-29: 2000 mg via INTRAVENOUS
  Filled 2013-12-29: qty 2000

## 2013-12-29 MED ORDER — SODIUM CHLORIDE 0.9 % IV SOLN
INTRAVENOUS | Status: DC
  Filled 2013-12-29: qty 30

## 2013-12-29 MED ORDER — FENTANYL CITRATE 0.05 MG/ML IJ SOLN
INTRAMUSCULAR | Status: AC
  Filled 2013-12-29: qty 4

## 2013-12-29 MED ORDER — HALOPERIDOL LACTATE 5 MG/ML IJ SOLN
1.0000 mg | Freq: Four times a day (QID) | INTRAMUSCULAR | Status: DC | PRN
  Filled 2013-12-29 (×2): qty 1

## 2013-12-29 MED ORDER — CEFUROXIME SODIUM 1.5 G IJ SOLR
1.5000 g | INTRAMUSCULAR | Status: AC
  Administered 2013-12-30: 1.5 g via INTRAVENOUS
  Administered 2013-12-30: .75 g via INTRAVENOUS
  Filled 2013-12-29: qty 1.5

## 2013-12-29 MED ORDER — ROCURONIUM BROMIDE 50 MG/5ML IV SOLN
80.0000 mg | Freq: Once | INTRAVENOUS | Status: AC
  Administered 2013-12-29: 80 mg via INTRAVENOUS

## 2013-12-29 MED ORDER — PHENYLEPHRINE HCL 10 MG/ML IJ SOLN
30.0000 ug/min | INTRAVENOUS | Status: AC
  Administered 2013-12-30: 10 ug/min via INTRAVENOUS
  Filled 2013-12-29: qty 2

## 2013-12-29 MED ORDER — HALOPERIDOL 0.5 MG PO TABS
0.5000 mg | ORAL_TABLET | Freq: Four times a day (QID) | ORAL | Status: DC | PRN
  Filled 2013-12-29: qty 1

## 2013-12-29 MED ORDER — NITROGLYCERIN IN D5W 200-5 MCG/ML-% IV SOLN
2.0000 ug/min | INTRAVENOUS | Status: DC
Start: 2013-12-29 — End: 2013-12-30
  Filled 2013-12-29: qty 250

## 2013-12-29 MED ORDER — DOPAMINE-DEXTROSE 3.2-5 MG/ML-% IV SOLN
0.0000 ug/kg/min | INTRAVENOUS | Status: AC
  Administered 2013-12-30: 3 ug/kg/min via INTRAVENOUS
  Filled 2013-12-29: qty 250

## 2013-12-29 MED ORDER — METOPROLOL TARTRATE 12.5 MG HALF TABLET
12.5000 mg | ORAL_TABLET | Freq: Once | ORAL | Status: AC
  Administered 2013-12-30: 12.5 mg via ORAL
  Filled 2013-12-29: qty 1

## 2013-12-29 MED ORDER — MAGNESIUM SULFATE 50 % IJ SOLN
40.0000 meq | INTRAMUSCULAR | Status: DC
  Filled 2013-12-29: qty 10

## 2013-12-29 MED ORDER — MIDAZOLAM HCL 2 MG/2ML IJ SOLN: 2.0000 mg | INTRAMUSCULAR | Status: DC | PRN

## 2013-12-29 MED ORDER — FUROSEMIDE 10 MG/ML IJ SOLN
20.0000 mg | Freq: Once | INTRAMUSCULAR | Status: AC
  Administered 2013-12-29: 20 mg via INTRAVENOUS

## 2013-12-29 MED ORDER — MIDAZOLAM HCL 2 MG/2ML IJ SOLN
INTRAMUSCULAR | Status: AC
  Administered 2013-12-29: 4 mg
  Filled 2013-12-29: qty 4

## 2013-12-29 MED ORDER — HALOPERIDOL LACTATE 5 MG/ML IJ SOLN: 1.0000 mg | Freq: Four times a day (QID) | INTRAMUSCULAR | Status: DC | PRN

## 2013-12-29 MED ORDER — FENTANYL CITRATE 0.05 MG/ML IJ SOLN
100.0000 ug | INTRAMUSCULAR | Status: DC | PRN
  Administered 2013-12-29 – 2013-12-30 (×2): 100 ug via INTRAVENOUS
  Filled 2013-12-29 (×2): qty 2

## 2013-12-29 MED ORDER — CHLORHEXIDINE GLUCONATE CLOTH 2 % EX PADS
6.0000 | MEDICATED_PAD | Freq: Once | CUTANEOUS | Status: AC
  Administered 2013-12-29: 6 via TOPICAL

## 2013-12-29 MED ORDER — SODIUM CHLORIDE 0.9 % IJ SOLN
10.0000 mL | Freq: Two times a day (BID) | INTRAMUSCULAR | Status: DC
  Administered 2013-12-29: 10 mL via INTRAVENOUS

## 2013-12-29 MED ORDER — SODIUM CHLORIDE 0.9 % IV BOLUS (SEPSIS): 250.0000 mL | Freq: Once | INTRAVENOUS | Status: DC

## 2013-12-29 NOTE — Procedures (Signed)
Arterial Catheter Insertion Procedure Note [name] [id number]         09-28-1985  Procedure: Insertion of Arterial Catheter                  Indications: Blood pressure monitoring and Frequent blood sampling  Procedure Details Consent: Unable to obtain consent because of altered level of consciousness. Time Out: Verified patient identification, verified procedure, site/side was marked, verified correct patient position, special equipment/implants available, medications/allergies/relevent history reviewed, required imaging and test results available.  Performed  Maximum sterile technique was used including antiseptics, cap, gloves, gown, hand hygiene, mask and sheet. Skin prep: Chlorhexidine; local anesthetic administered 20 gauge catheter was inserted into right radial artery using the Seldinger technique.  Evaluation Blood flow good; BP tracing good. Complications: No apparent complications.  Procedure went well with no complications.  Assisted in procedure by Daron Offer, RRT.  RT will continue to monitor. [name] 12/29/2013

## 2013-12-29 NOTE — Progress Notes (Signed)
[place] TEAM 1 - Stepdown/ICU TEAM Progress Note  [name] RUE:[id number] DOB: [birth year] DOA: 12/23/2013 PCP: No primary care provider on file.  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 The patient's symptoms started about 5 days prior when he developed chills, rigors, headache, fever, neck stiffness, nausea, and vomiting. He was seen at an Urgent Care and diagnosed with the flu. Went to the ED the following day and was diagnosed with abnormal liver function and thrombocytopenia. Had a CT scan of the abdomen which was negative. Subsequently followed up with his PCP and was sent back to the ER for suspected meningitis.  In the ED lumbar puncture noted elevated CSF WBC count. Patient was tachycardic in the 140s, w/ platelet count of 9, creatinine 1.9.  DIC physiology has improved as well as pt's overall mentation. His platelets have risen to >100,000 but his white count has also risen to > 30,000. He had an episode of dyspnea, tachypnea, CP and tachycardia. CXR was negative. Pt with mild LE edema so lower extremity duplex ordered.   11/17 had recurrent issues with tachycardia, agitation and tachypnea but now with hypoxia and fever 102.8 overnight (PO2 57 on 100% NRB). Noted with RR 40s and PCXR with new dense bilateral fluffy infiltrates. ID adjusted anbxs to cover for ARDS/HCAP/septic emboli. PCCM consulted due to concerns high risk for intubation. Pt transferred to ICU.  HPI/Subjective: Although he denies SOB he is clearly breathing fast and appears in moderate distress-no other complaints  Assessment/Plan: Acute Respiratory Failure with hypoxia due to: HCAP vs Septic emboli vs PE with ARDS vs acute CHF due to acute valve dysfunction Has had intermittent  tachypneic and with tachycardia for since early yesterday with significant worsening past several hours with hypoxia (PO2  57 ABG on 100% NRB)-increased WOB and persistent RR 40s even when asleep- PCXR with dense bilateral infiltrates with recent fever and progressive leukocytosis- ID has adjusted anbx's and rec CTA chest; has acute renal insufficiency so may not be able to pursue at this time-PCCM consulted and pt to transfer to ICU; high risk for intubation  Group B streptococcal meningitis with scattered brain microabscesses and sepsis - probable endocarditis - possible septic arthritis  -CSF culture positive GROUP B STREP-ID following/managing Rocephin-HIV antibody negative-brain MRI confirms bacterial meningitis, cerebritis, and scattered microabscesses -TTE raises concern of tricuspid vegetation, and even possible aortic valve vegetation vs/ bicuspid valve - Cardiology consulted 11/17 for TEE and to evaluate possible cardiac etiology for acute resp failure-11/15 shoulder exam w/o findings to suggest septic joint and given climbing WBCs plan was to pursue MRI of left shoulder but with acute resp failure this was cancelled.-Blood cx repeated 11/16 for TM 102.8 overnight  Bilateral lower extremity edema Edema is minimal but given constellation of sx's overnight (dyspnea, tachypnea, CP, tachycardia with neg CXR) -has had 2 lower extremity venous duplex studies this admit and bot have been negative- d dimer unreliable given recent DIC-unable to pursue CT chest to r/o PE presently as renal fxn still abnormal although current trend is down after dc NSAIDs  Acute renal failure likely ATN -Had subtle increase in Scr 11/16 so NSAIDs were dc'd- current trend is down but not at baseline  Thrombocytopenia -secondary to DIC - plt count climbing -currently patient has no signs or symptoms of bleeding  Leg pain, with calf muscle tenderness -doppler negative for DVT - CK normal  Code Status: FULL Family Communication: spoke w/ girlfriend (common law wife) at length  Disposition Plan: Transfer to ICU under care  PCCM  Consultants: ID  Procedure/Significant Events: 11/11 LP 11/12 B LE venous duplex negative for DVT 11/14 TTE - possible tricuspid vegetation - possible aortic vegetation vs/ bicuspid valve  11/16 LE VENOUS DUPLEX NEG FOR dvt  Antibiotics: Acyclovir 11/11>11/12 Vancomycin 11/11>11/12, 11/17 > Ceftriaxone 11/11 >11/17 Maxipime 11/17 >>  DVT prophylaxis: SCD-pt counseled to wear   Objective: Blood pressure 119/36, pulse 110, temperature 100.7 F (38.2 C), temperature source Axillary, resp. rate 36, height 5\' 5"  (1.651 m), weight 212 lb 15.4 oz (96.6 kg), SpO2 96 %.  Intake/Output Summary (Last 24 hours) at 12/29/13 1236 Last data filed at 12/29/13 0900  Gross per 24 hour  Intake    250 ml  Output    400 ml  Net   -150 ml   Exam: General: No acute respiratory distress-easily frustrated but then apologizes Lungs: Coarse to ausculation bilaterally without wheezes or crackles, 100% NRB, marked tachypnea w/ RR 40s Cardiovascular: minimal tachycardic, regular rhythm - negative gallop or rub - pan systolic 2/6 Murmur LSB 2nd ICS Abdomen: nontender, nondistended, soft, bowel sounds positive, no rebound, no ascites, no appreciable mass Extremities: mod LE edema B - B shoulder w/o erythema, or appreciable effusion    Data Reviewed: Basic Metabolic Panel:  Recent Labs Lab 12/25/13 0315 12/25/13 1309 12/26/13 0346 12/27/13 0436 12/28/13 0320 12/29/13 0331  NA 138 137 137 141 138 133*  K 4.4 4.3 4.3 4.2 4.0 3.5*  CL 104 105 105 107 104 98  CO2 20 19 17* 20 22 17*  GLUCOSE 176* 163* 158* 93 91 171*  BUN 40* 40* 32* 20 17 17   CREATININE 1.30 1.22 1.12 1.34 1.60* 1.47*  CALCIUM 7.8* 7.7* 7.4* 7.6* 7.3* 7.3*  MG 3.2*  --   --   --   --   --   PHOS  --   --   --  4.2  --   --    Liver Function Tests:  Recent Labs Lab 12/24/13 0503 12/25/13 0315 12/26/13 0346 12/27/13 0436 12/28/13 0320 12/29/13 0331  AST 41* 29 34  --  25 60*  ALT 37 32 37  --  34 60*  ALKPHOS  95 69 73  --  97 75  BILITOT 4.4* 2.1* 1.3*  --  1.2 0.8  PROT 4.8* 4.8* 4.5*  --  4.8* 5.1*  ALBUMIN 2.3* 2.1* 2.0* 2.0* 1.9* 1.9*   CBC:  Recent Labs Lab 12/23/13 1125  12/25/13 0315 12/26/13 0346 12/27/13 0436 12/28/13 0320 12/28/13 1250 12/29/13 0331  WBC 10.3  < > 12.2* 15.8* 22.4* 32.3*  --  24.6*  NEUTROABS 9.8*  --  9.1*  --   --   --  23.7*  --   HGB 16.4  < > 14.4 11.8* 12.4* 12.0*  --  10.7*  HCT 46.3  < > 41.3 33.7* 36.3* 35.6*  --  31.2*  MCV 83.4  < > 84.1 85.3 86.8 87.5  --  87.4  PLT 9*  < > 24* 73* 85* 101*  --  167  < > = values in this interval not displayed. Cardiac Enzymes:  Recent Labs Lab 12/23/13 1815 12/23/13 2315 12/24/13 0503 12/28/13 0320 12/28/13 0726 12/28/13 1250  CKTOTAL 66  --   --   --   --   --   CKMB <1.0  --   --   --   --   --  TROPONINI <0.30 <0.30 <0.30 0.37* 0.49* 0.58*     Recent Results (from the past 240 hour(s))  CSF culture     Status: None   Collection Time: 12/23/13 12:20 PM  Result Value Ref Range Status   Specimen Description CSF  Final   Special Requests NONE  Final   Gram Stain   Final    CYOSPIN NO WBC SEEN NO ORGANISMS SEEN Performed at [facility]    Culture   Final    FEW GROUP B STREP(S.AGALACTIAE)ISOLATED Note: CRITICAL RESULT CALLED TO, READ BACK BY AND VERIFIED WITH: STEPHANIE SHAW @ 0744 ON [id number] BY [facility] Performed at [facility]    Report Status 12/25/2013 FINAL  Final   Organism ID, Bacteria GROUP B STREP(S.AGALACTIAE)ISOLATED  Final      Susceptibility   Group b strep(s.agalactiae)isolated - MIC*    ERYTHROMYCIN >=8 RESISTANT Resistant     PENICILLIN <=0.06 SENSITIVE Sensitive     VANCOMYCIN 0.5 SENSITIVE Sensitive     AMPICILLIN <=0.25 SENSITIVE Sensitive     * FEW GROUP B STREP(S.AGALACTIAE)ISOLATED  MRSA PCR Screening     Status: None   Collection Time: 12/23/13  5:27 PM  Result Value Ref Range Status   MRSA by PCR NEGATIVE NEGATIVE Final    Comment:        The  GeneXpert MRSA Assay (FDA approved for NASAL specimens only), is one component of a comprehensive MRSA colonization surveillance program. It is not intended to diagnose MRSA infection nor to guide or monitor treatment for MRSA infections.   Culture, blood (routine x 2)     Status: None (Preliminary result)   Collection Time: 12/24/13 10:50 AM  Result Value Ref Range Status   Specimen Description BLOOD RIGHT HAND  Final   Special Requests   Final    BOTTLES DRAWN AEROBIC AND ANAEROBIC 6CC AER 5CC ANA   Culture  Setup Time   Final    12/24/2013 18:48 Performed at [facility]    Culture   Final           BLOOD CULTURE RECEIVED NO GROWTH TO DATE CULTURE WILL BE HELD FOR 5 DAYS BEFORE ISSUING A FINAL NEGATIVE REPORT Performed at [facility]    Report Status PENDING  Incomplete  Culture, blood (routine x 2)     Status: None (Preliminary result)   Collection Time: 12/24/13 10:58 AM  Result Value Ref Range Status   Specimen Description BLOOD LEFT ANTECUBITAL  Final   Special Requests BOTTLES DRAWN AEROBIC AND ANAEROBIC 10CC  Final   Culture  Setup Time   Final    12/24/2013 18:48 Performed at [facility]    Culture   Final           BLOOD CULTURE RECEIVED NO GROWTH TO DATE CULTURE WILL BE HELD FOR 5 DAYS BEFORE ISSUING A FINAL NEGATIVE REPORT Performed at [facility]    Report Status PENDING  Incomplete  Culture, blood (routine x 2)     Status: None (Preliminary result)   Collection Time: 12/28/13 11:50 AM  Result Value Ref Range Status   Specimen Description BLOOD LEFT ANTECUBITAL  Final   Special Requests BOTTLES DRAWN AEROBIC AND ANAEROBIC 10CC  Final   Culture  Setup Time   Final    12/28/2013 16:46 Performed at [facility]    Culture   Final           BLOOD CULTURE RECEIVED NO GROWTH TO DATE  CULTURE WILL BE HELD FOR 5 DAYS BEFORE ISSUING A FINAL NEGATIVE REPORT Performed at [facility]    Report Status PENDING   Incomplete  Culture, blood (routine x 2)     Status: None (Preliminary result)   Collection Time: 12/28/13 12:05 PM  Result Value Ref Range Status   Specimen Description BLOOD LEFT HAND  Final   Special Requests BOTTLES DRAWN AEROBIC ONLY 10CC  Final   Culture  Setup Time   Final    12/28/2013 16:46 Performed at [facility]    Culture   Final           BLOOD CULTURE RECEIVED NO GROWTH TO DATE CULTURE WILL BE HELD FOR 5 DAYS BEFORE ISSUING A FINAL NEGATIVE REPORT Performed at [facility]    Report Status PENDING  Incomplete     Studies:  Recent x-ray studies have been reviewed in detail by the Attending Physician  Scheduled Meds:  Scheduled Meds: . ceFEPime (MAXIPIME) IV  2 g Intravenous Q12H  . feeding supplement (RESOURCE BREEZE)  1 Container Oral TID BM  . vancomycin  2,000 mg Intravenous Once    Time spent on care of this patient: 35 mins  Junious Silk, ANP [facility] For Consults/Admissions - Flow Manager - [phone] Office  [phone] Pager [phone]  On-Call/Text Page:      [email or web address]      password [facility]  12/29/2013, 12:36 PM   LOS: 6 days  Examined patient with ANP Revonda Standard and discussed assessment and plan and agree with above plan. Patient with multiple complex medical problems> 40 minutes spent directly on patient care

## 2013-12-29 NOTE — Progress Notes (Signed)
Telephone page placed to MD and received return telephone call. Update given to MD on patient status. Informed the patient anxiety remains constantly high, increased respirations with shallow rapid mouth breathing,refusal of ativan. Will continue to monitor patient status. Melany Guernsey

## 2013-12-29 NOTE — Progress Notes (Addendum)
Report called to [employer] on unit 57M. Patient to be transferred to [id number] via bed.Attempt to call mom Junious Dresser [phone] no answer and voicemail has not been set up. Mom at bedside and aware of transfer.

## 2013-12-29 NOTE — Progress Notes (Addendum)
Transesophageal echocardiogram has been performed. The patient has normal LV function. There is a large aortic valve vegetation with severe aortic insufficiency. There is a large tricuspid valve vegetation with at least moderate tricuspid regurgitation. 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 I have discussed the results with Dr. Tyrone Sage. He will see the patient and the patient will require urgent surgery. I explained the results to the patient's family including the critical nature of his disease. Olga Millers

## 2013-12-29 NOTE — Progress Notes (Addendum)
Telephone call from rapid response inquiring about patient status. Informed of patient high anxiety level, rapid shallow mouth breathing, patient ability to slow respirations and increase O2 stats,patient remains tachycardiac 100's-117's. Checked patient status. Patient informed nurse that he had just vomited. Medication offered. Patient refused at this time.Will continue to monitor patient status.Melany Guernsey

## 2013-12-29 NOTE — Progress Notes (Signed)
Telephone call place to inquire about change of medication to help patient endure MRI. Patient and significant other stated that ativan had the opposite desired affect and refused to take it again. Will continue to monitor patient status. Melany Guernsey

## 2013-12-29 NOTE — Progress Notes (Addendum)
[facility]    Date of Admission:  12/23/2013   Total days of antibiotics 7        Day 7 ceftriaxone 2gm iv q12hr           ID: [name] is a 28 y.o. 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  Anxious overnight, increasing work of breathing and dyspnea/hypoxia. He is on nonrebreather  Cxr: showing bilateral infiltrate  Medications:  . cefTRIAXone (ROCEPHIN)  IV  2 g Intravenous Q12H  . feeding supplement (RESOURCE BREEZE)  1 Container Oral TID BM    Objective: Vital signs in last 24 hours: Temp:  [98.5 F (36.9 C)-102.6 F (39.2 C)] 100.7 F (38.2 C) (11/17 0839) Pulse Rate:  [108-125] 110 (11/17 0839) Resp:  [18-46] 36 (11/17 0839) BP: (112-126)/(32-51) 119/36 mmHg (11/17 0839) SpO2:  [87 %-98 %] 96 % (11/17 0839) Physical Exam  Constitutional: non rebreather in discomfort HENT:  Mouth/Throat: Oropharynx is clear and moist. No oropharyngeal exudate.  Cardiovascular: tachycardic, regular rhythm and normal heart sounds. Exam reveals no gallop and no friction rub.  No murmur heard.  Pulmonary/Chest: coruse bs Abdominal: Soft. Bowel sounds are normal. He exhibits no distension. There is no tenderness.  Lymphadenopathy:  He has no cervical adenopathy.  Neurological: He is alert and oriented to person, place, and time.  Skin: Skin is warm and dry. No rash noted. No erythema.  Psychiatric: He has a normal mood and affect. His behavior is normal.    Lab Results  Recent Labs  12/28/13 0320 12/29/13 0331  WBC 32.3* 24.6*  HGB 12.0*  10.7*  HCT 35.6* 31.2*  NA 138 133*  K 4.0 3.5*  CL 104 98  CO2 22 17*  BUN 17 17  CREATININE 1.60* 1.47*   Liver Panel  Recent Labs  12/28/13 0320 12/29/13 0331  PROT 4.8* 5.1*  ALBUMIN 1.9* 1.9*  AST 25 60*  ALT 34 60*  ALKPHOS 97 75  BILITOT 1.2 0.8    Microbiology: 11/12 blood culture ngtd 11/ 11 csf culture: group b strep 11/16 blood cx pending Studies/Results: Dg Chest Port 1 View  12/28/2013   CLINICAL DATA:  Shortness of breath.  EXAM: PORTABLE CHEST - 1 VIEW  COMPARISON:  None.  FINDINGS: Shallow inspiration. The heart size and mediastinal contours are within normal limits. Both lungs are clear. The visualized skeletal structures are unremarkable.  IMPRESSION: No active disease.   Electronically Signed   By: Burman Nieves M.D.   On: 12/28/2013 01:45     Assessment/Plan: 28yo M with group b streptococcus disseminated infection having new onset of fever yesterday and worsening leukocytosis and respiratory distress. cxr showing bilateral infiltrates, hypoxic on nonrebreather  Respiratory distress: Concern for HCAP vs. PE with ARDS - please also get CTA of chest to see if has PE/septic emboli that could also explain tachypnea - still recommend TEE - will change antibiotics to vanco will load with 2gm iv athen 1gm q 8hr and cefepime 2gm q 12hr for the moment - if he gets  intubated will need to get sputum samples  Group b strep disseminated infection= will be covered by cefepime and vanco  Thrombocytopenia = resolving, continues to improve today at 167, safe to get TEE now  Acute kidney injury = could be due to insensible loss, most likely pre-renal  transaminitis = mildly elevated, will continue to follow  Leukocytosis = improving  Drue SecondSNIDER, [facility] Cell: [phone] Pager: [phone]  12/29/2013, 9:06 AM

## 2013-12-29 NOTE — Consult Note (Signed)
PULMONARY / CRITICAL CARE MEDICINE   Name: Elliot CousinHayden Medlock MRN: [id number] DOB: 01/23/1986    ADMISSION DATE:  12/23/2013 CONSULTATION DATE:  12/29/13  REFERRING MD :  Dr Sharon SellerMcClung, [facility]  Reason for consultation:  Respiratory distress, B pulmonary infiltrates  INITIAL PRESENTATION:  28 yo man, admitted 11/11 with meningitis c/b transaminitis and DIC.  LP showed Group B strep and MRI brain showed embolic process. 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 Developed progressive dyspnea and respiratory distress 11/17 with evolving (new) B infiltrates on CXR.   STUDIES:  Head CT 12/23/13 >> L frontal sinusitis, no acute findings MRI brain 11/12 >> bacterial meningitis, cerebritis, scattered microabscesses  CXR 11/16 >> no infiltrates B LE doppler US 11/16 >> no LE DVT  SIGNIFICANT EVENTS:  HISTORY OF PRESENT ILLNESS:  28 yo man, never smoker, developed HA, fevers and chills, neck pain, photophobia and neck stiffness around 12/18/13. He was evaluated at [facility] 11/11 and noted to have transaminitis and thrombocytopenia.  LP performed 11/11 and consistent with acute meningitis. Abx started. CSF grew out Group B strep. MRI brain performed and supported cerebritis, also showed multiple small apparently embolic abscesses. In light of suspected embolic process TTE was performed that confirmed a tricuspid vegetation, possibly an aortic vegetation as well. There has been some question of possible septic shoulder joint. He evolved respiratory distress 11/17 with corresponding new B infiltrates, etiology unclear   PAST MEDICAL HISTORY :   has no past medical history on file.  has no past surgical history on file. Prior to Admission medications   Medication Sig Start Date End Date Taking? Authorizing Provider  HYDROcodone-acetaminophen (NORCO/VICODIN) 5-325 MG per tablet Take 2 tablets by mouth every 4 (four) hours as needed. As needed for pain 12/20/13  Yes Historical Provider, MD   ondansetron (ZOFRAN-ODT) 4 MG disintegrating tablet Take 4 mg by mouth every 6 (six) hours. 12/21/13  Yes Historical Provider, MD  prochlorperazine (COMPAZINE) 10 MG tablet Take 1 tablet by mouth 3 (three) times daily. 12/20/13  Yes Historical Provider, MD  TAMIFLU 75 MG capsule Take 75 mg by mouth 2 (two) times daily. 12/20/13  Yes Historical Provider, MD   No Known Allergies  FAMILY HISTORY:  has no family status information on file.  SOCIAL HISTORY:  reports that he has never smoked. He uses smokeless tobacco. He reports that he drinks alcohol. He reports that he does not use illicit drugs.  REVIEW OF SYSTEMS:  Pt c/o some dyspnea, no CP.   SUBJECTIVE:  Acknowledges SOB, does not a[ppear to be in significant distress.   VITAL SIGNS: Temp:  [98.5 F (36.9 C)-102.6 F (39.2 C)] 100.7 F (38.2 C) (11/17 0839) Pulse Rate:  [108-125] 110 (11/17 0839) Resp:  [18-46] 36 (11/17 0839) BP: (112-126)/(32-51) 119/36 mmHg (11/17 0839) SpO2:  [87 %-98 %] 96 % (11/17 0839) HEMODYNAMICS:   VENTILATOR SETTINGS:   INTAKE / OUTPUT:  Intake/Output Summary (Last 24 hours) at 12/29/13 1135 Last data filed at 12/29/13 0900  Gross per 24 hour  Intake    250 ml  Output    400 ml  Net   -150 ml    PHYSICAL EXAMINATION: General:  Young man supine, very mild respiratory distress on supplemental O2 Neuro:  Awake alert and interactive, nonfocal, oriented HEENT:  OP clear, no lesions Cardiovascular:  Tachycardic, regular, 2-3/6 systolic M heard throughout chest Lungs:  Bilateral inspiratory crackles all fields Abdomen:  Soft, NT, + BS Musculoskeletal:  No deformities, no edema  Skin:  No rash  LABS:  CBC  Recent Labs Lab 12/27/13 0436 12/28/13 0320 12/29/13 0331  WBC 22.4* 32.3* 24.6*  HGB 12.4* 12.0* 10.7*  HCT 36.3* 35.6* 31.2*  PLT 85* 101* 167   Coag's  Recent Labs Lab 12/23/13 1815 12/26/13 0346  APTT 30  --   INR 1.22 1.28   BMET  Recent Labs Lab 12/27/13 0436  12/28/13 0320 12/29/13 0331  NA 141 138 133*  K 4.2 4.0 3.5*  CL 107 104 98  CO2 20 22 17*  BUN 20 17 17   CREATININE 1.34 1.60* 1.47*  GLUCOSE 93 91 171*   Electrolytes  Recent Labs Lab 12/25/13 0315  12/27/13 0436 12/28/13 0320 12/29/13 0331  CALCIUM 7.8*  < > 7.6* 7.3* 7.3*  MG 3.2*  --   --   --   --   PHOS  --   --  4.2  --   --   < > = values in this interval not displayed. Sepsis Markers No results for input(s): LATICACIDVEN, PROCALCITON, O2SATVEN in the last 168 hours. ABG  Recent Labs Lab 12/29/13 0547  PHART 7.539*  PCO2ART 22.6*  PO2ART 57.4*   Liver Enzymes  Recent Labs Lab 12/26/13 0346 12/27/13 0436 12/28/13 0320 12/29/13 0331  AST 34  --  25 60*  ALT 37  --  34 60*  ALKPHOS 73  --  97 75  BILITOT 1.3*  --  1.2 0.8  ALBUMIN 2.0* 2.0* 1.9* 1.9*   Cardiac Enzymes  Recent Labs Lab 12/28/13 0320 12/28/13 0726 12/28/13 1250  TROPONINI 0.37* 0.49* 0.58*   Glucose No results for input(s): GLUCAP in the last 168 hours.  Imaging Dg Chest Port 1 View  12/28/2013   CLINICAL DATA:  Shortness of breath.  EXAM: PORTABLE CHEST - 1 VIEW  COMPARISON:  None.  FINDINGS: Shallow inspiration. The heart size and mediastinal contours are within normal limits. Both lungs are clear. The visualized skeletal structures are unremarkable.  IMPRESSION: No active disease.   Electronically Signed   By: Burman Nieves M.D.   On: 12/28/2013 01:45     ASSESSMENT / PLAN:  PULMONARY A: Acute hypoxemic respiratory failure in the setting of bilateral pulmonary infiltrates Bilateral infiltrates- etiology currently unclear, consider either embolic pneumonia or a new healthcare associated pneumonia. Also consider cardiogenic edema due to valvular dysfunction or noncardiogenic edema due to ARDS. He is at high risk for acute lung injury P:   - support with O2 at this time. He will be moved to the ICU for closer monitoring - May require mechanical ventilation. This was  reviewed with him and he understands the indications - We will discuss broadening his antibiotics with Dr. Drue Second with infectious diseases to cover possible HCAP.  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 Possible acute systolic CHF due to valvular dysfunction P:  - agree with plans for formal cardiology evaluation and probable TEE - agree with diuretics as his blood pressure and renal function will tolerate; these were initiated 11/17  RENAL A:  Acute renal insufficiency, likely due to ATN P:   - follow S Cr and electrolytes  GASTROINTESTINAL A:  No issues P:   - no current GI acid prophylaxis > on a diet  HEMATOLOGIC  A:  Thrombocytopenia due to DIC, resolving P:  - follow CBC  INFECTIOUS A:  Group B strep endocarditis, treated by septic brain abscesses and meningitis Possible embolic pneumonias versus HCAP P:   Blood 11/12 >> Negative Blood 11/16 >>  CSF 11/11 >> Group B strep (R to erythromycin)  Abx:  ceftriaxone 2 g 11/11 >> 11/17 Cefepime 11/17 >>  vanco 11/17 >>   - Broadening to cover possible HCAP until culture data can be obtained. Discussed with ID   ENDOCRINE A:  No acute issues   P:    NEUROLOGIC A:  Mild lethargy, likely related to brain lesions and overall illness.  P:   RASS goal: 0 - careful with sedating medications   FAMILY  - Updates:  Reviewed plans 11/17 with patient, girlfriend and cousin at bedside  - Inter-disciplinary family meet or Palliative Care meeting due by: 11/24    TODAY'S SUMMARY: acute respiratory failure in setting of bilateral infiltrates, etiology unclear but consider pneumonia versus acute lung injury versus cardiogenic edema. Moving to ICU, may require mechanical ventilation. Consider chest CT to  better evaluate his infiltrates and to rule out PE (doubt)    60 minutes Independent CC time   Levy Pupa, MD, PhD 12/29/2013, 1:08 PM [facility] [phone] or if no answer [phone]

## 2013-12-29 NOTE — Procedures (Signed)
Intubation Procedure Note [name] [id number] August 03, 1985  Procedure: Intubation Indications: Respiratory insufficiency  Procedure Details Consent: Risks of procedure as well as the alternatives and risks of each were explained to the (patient/caregiver).  Consent for procedure obtained. Time Out: Verified patient identification, verified procedure, site/side was marked, verified correct patient position, special equipment/implants available, medications/allergies/relevent history reviewed, required imaging and test results available.  Performed  MAC and 3 Medications:  Fentanyl 100 mcg Etomidate 40 mg Versed 2 mg NMB zemuron 80 mg   Evaluation Hemodynamic Status: BP stable throughout; O2 sats: stable throughout Patient's Current Condition: stable Complications: No apparent complications Patient did tolerate procedure well. Chest X-ray ordered to verify placement.  CXR: pending.   [name] ACNP [name] Pager [phone] till 3 pm If no answer page [phone] 12/29/2013, 2:36 PM   Albert Pupa, MD, PhD 12/30/2013, 5:52 PM [name] [phone] or if no answer [phone]
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
## 2013-12-29 NOTE — Consult Note (Addendum)
CARDIOLOGY CONSULT NOTE   Patient ID: [name] MRN: [id number] DOB/AGE: [birth year] 26 y.o.  Admit date: 12/23/2013  Primary Physician   No primary care provider on file. Primary Cardiologist  New Reason for Consultation   SOB and vegetation seen on tricuspid valve  HPI: [name] is a 28 y.o. male with no real past medical history who was transferred to [facility] from [facility] on 12/23/13 for suspected meningitis.   The patient's symptoms started about 5 days prior to admission when he developed chills, rigors, headache, fever, neck stiffness, nausea, and vomiting. He was seen at an Urgent Care and diagnosed with the flu. Went to the ED the following day and was diagnosed with abnormal liver function and thrombocytopenia. Had a CT scan of the abdomen which was negative. Subsequently followed up with his PCP and was sent back to the ER for suspected meningitis. In the ED lumbar puncture noted elevated CSF WBC count. Patient was tachycardic in the 140s, w/ platelet count of 9, creatinine 1.9. He had DIC but now DIC physiology has improved as well as pt's overall mentation. His platelets have risen to >100,000 but his white count has also risen to > 30,000. He had an episode of dyspnea, tachypnea, CP and tachycardia. CXR was negative. Lower extremity duplex negative for DVT.   This morning around 4am he awoke suddenly with nausea/vomitting and became acutely SOB. He has remain SOB since that time. He also notes coughing when he takes a deep breath in. He denies chest pain. He reports bilateral LE edema for the past two days. He has never had anything like this before and denies any past medical history including smoking, alcohol or illicit drug use. He denies a past cardiac history including congenital heart defects. No recent illnesses.        No Known Allergies  I have reviewed the patient's current medications . ceFEPime (MAXIPIME) IV  2 g Intravenous Q12H  . feeding supplement  (RESOURCE BREEZE)  1 Container Oral TID BM  . vancomycin  2,000 mg Intravenous Once   . sodium chloride 1,000 mL (12/29/13 1116)   haloperidol **OR** haloperidol lactate, ondansetron **OR** ondansetron (ZOFRAN) IV, traMADol  Prior to Admission medications   Medication Sig Start Date End Date Taking? Authorizing Provider  HYDROcodone-acetaminophen (NORCO/VICODIN) 5-325 MG per tablet Take 2 tablets by mouth every 4 (four) hours as needed. As needed for pain 12/20/13  Yes Historical Provider, MD  ondansetron (ZOFRAN-ODT) 4 MG disintegrating tablet Take 4 mg by mouth every 6 (six) hours. 12/21/13  Yes Historical Provider, MD  prochlorperazine (COMPAZINE) 10 MG tablet Take 1 tablet by mouth 3 (three) times daily. 12/20/13  Yes Historical Provider, MD  TAMIFLU 75 MG capsule Take 75 mg by mouth 2 (two) times daily. 12/20/13  Yes Historical Provider, MD     History   Social History  . Marital Status: Married    Spouse Name: N/A    Number of Children: N/A  . Years of Education: N/A   Occupational History  . Not on file.   Social History Main Topics  . Smoking status: Never Smoker   . Smokeless tobacco: Current User  . Alcohol Use: Yes  . Drug Use: No  . Sexual Activity: Not on file   Other Topics Concern  . Not on file   Social History Narrative  . No narrative on file    No family status information on file.   No family history on file.  ROS:  Full 14 point review of systems complete and found to be negative unless listed above.  Physical Exam: Blood pressure 119/36, pulse 110, temperature 100.7 F (38.2 C), temperature source Axillary, resp. rate 36, height 5\' 5"  (1.651 m), weight 212 lb 15.4 oz (96.6 kg), SpO2 96 %.  General: Well developed, well nourished, male  Mildly obese. Appears tachypnic and uncomfortable Head: Eyes PERRLA, No xanthomas.   Normocephalic and atraumatic, oropharynx without edema or exudate.  Lungs: tachypnic. decreased breath sounds. mild diffuse  rhonchi. Expiratory cough. Heart: HRRR S1 S2, no rub/gallop, Heart irregular rate and rhythm with S1, S2  murmur. pulses are 2+ extrem.   Neck: No carotid bruits. No lymphadenopathy.  No JVD. Abdomen: Bowel sounds present, abdomen soft and non-tender without masses or hernias noted. Msk:  No spine or cva tenderness. No weakness, no joint deformities or effusions. Extremities: No clubbing or cyanosis. 2+ bilateral edema.  Neuro: Alert and oriented X 3. No focal deficits noted. Psych:  Good affect, responds appropriately Skin: No rashes or lesions noted.  Labs:   Lab Results  Component Value Date   WBC 24.6* 12/29/2013   HGB 10.7* 12/29/2013   HCT 31.2* 12/29/2013   MCV 87.4 12/29/2013   PLT 167 12/29/2013   No results for input(s): INR in the last 72 hours.  Recent Labs Lab 12/29/13 0331  NA 133*  K 3.5*  CL 98  CO2 17*  BUN 17  CREATININE 1.47*  CALCIUM 7.3*  PROT 5.1*  BILITOT 0.8  ALKPHOS 75  ALT 60*  AST 60*  GLUCOSE 171*  ALBUMIN 1.9*   MAGNESIUM  Date Value Ref Range Status  12/25/2013 3.2* 1.5 - 2.5 mg/dL Final    Recent Labs  29/56/21 0320 12/28/13 0726 12/28/13 1250  TROPONINI 0.37* 0.49* 0.58*    Lab Results  Component Value Date   DDIMER >20.00* 12/23/2013    Echo: Study Date: 12/26/2013 LV EF: 55% -  60% Study Conclusions - Left ventricle: The cavity size was mildly dilated. Wall thickness was normal. Systolic function was normal. The estimated ejection fraction was in the range of 55% to 60%. Wall motion was normal; there were no regional wall motion abnormalities. - Aortic valve: The AV is proabably bisucpid but in some views is suggestive of a vegetation. Recommend TEE for further delineation. Possibly bicuspid. There was mild to moderate regurgitation. - Mitral valve: There was mild regurgitation. - Tricuspid valve: There is a large shaggy density on the TV that is concerning for vegetation. There is at least moderate  TR that is eccentric. - Pulmonic valve: There was no regurgitation.  ECG:  1st deg AV block. Tachy: HR 106 TWI in V1 and lead III  Radiology:  Dg Chest Port 1 View  12/28/2013   CLINICAL DATA:  Shortness of breath.  EXAM: PORTABLE CHEST - 1 VIEW  COMPARISON:  None.  FINDINGS: Shallow inspiration. The heart size and mediastinal contours are within normal limits. Both lungs are clear. The visualized skeletal structures are unremarkable.  IMPRESSION: No active disease.   Electronically Signed   By: Burman Nieves M.D.   On: 12/28/2013 01:45    ASSESSMENT AND PLAN:    Active Problems:   Thrombocytopenia   Meningitis due to bacterium   Acute renal failure syndrome   Dehydration   Sepsis due to group B Streptococcus   LFT elevation   Meningitis, streptococcal   Headache   Pain in joint, lower leg   Brain abscess  [name] is a 28 y.o. male with no real past medical history who [facility] for suspected meningitis.   Group B streptococcal meningitis with scattered brain microabscesses and sepsis - probable endocarditis - possible septic arthritis  -- CSF culture positive GROUP B STREP- ID following/managing Rocephin. -- Brain MRI confirms bacterial meningitis, cerebritis, and scattered microabscesses  --TTE raises concern of tricuspid vegetation, and even possible aortic valve vegetation vs/ bicuspid valve  -- HIV antibody negative -- 11/15 shoulder exam w/o findings to suggest septic joint, but if pain persists will need MRI of shoulder -- Continue Abx per IM and ID. -- Will likely need a TEE. Can now safely proceed with platelet count normalized.   SOB/chest pain -- Dyspnea, tachypnea, tachycardia with pulmonary edema on CXR. May need diuresis  -- Troponin slightly elevated 0.37--> 0.58. Likely demand ischemia in the setting of sepsis  -- With bilateral LE edema, LE duplex NEGATIVE for DVT -- D dimer markedly elevated, but unreliable given  recent DIC -- Unable to perform CT chest to r/o PE due to AKI  Acute renal failure likely ATN -- Creat slightly improved from yesterday 1.6-->1.47  Thrombocytopenia -- Secondary to DIC - now resolved  Leg pain, with calf muscle tenderness -- Doppler negative for DVT - CK normal     SignedJanetta Hora: THOMPSON, KATHRYN R, PA-C 12/29/2013 11:56 AM  Pager [phone]  Co-Sign MD  As above, patient seen and examined.Briefly he is a 28 year old male who I'm asked to evaluate for SBE. Patient was admitted on November 11 with complaints of headache, fevers and nausea. He was found to have a creatinine of 1.90 and platelet count 9000. Spinal fluid showed group B strep. Cardiac MRI felt consistent with bacterial meningitis, cerebritis and scattered micro-abscesses. Patient has been treated with antibiotics. Echocardiogram on November 14 has been personally reviewed. This showed normal LV function. The aortic valve is most likely bicuspid and there is probable vegetation noted. There was moderate aortic insufficiency. There is also a possible tricuspid valve vegetation and moderate tricuspid regurgitation. There is also a possible fistula from LVOT to RV. Patient's headache has improved. However over the past 24-36 hours he has developed worsening dyspnea. He denies chest pain. There is pedal edema. Cardiology asked to evaluate. Physical exam shows diminished breath sounds at the bases, 3/6 systolic and 2/6 diastolic murmur left sternal border, 2+ pedal edema. Chest x-ray shows diffuse bilateral airspace disease consistent with pulmonary edema. White blood cell count is 24.6 with platelet count improved to 167. Electrocardiogram shows sinus tachycardia with first-degree AV block and RVCD. Blood cultures have been negative to date. Note patient has not had recent dental work.  Patient appears to have group B strep endocarditis with vegetations on aortic valve and tricuspid valve; possible fistula from LVOT to RV.  Continue present antibiotics. Infectious disease following. He has developed pulmonary edema. He has a positive net fluid balance of approximately 6 L since admission. Will DC IV fluids. Gently diurese. Follow renal function closely. I'm also concerned that his aortic insufficiency and tricuspid regurgitation are contributing to congestive heart failure. He needs a transesophageal echocardiogram. At present he is on 100% oxygen and therefore he would require intubation. I will try to wean his oxygen and if he tolerates we will proceed with transesophageal echocardiogram later today or tomorrow morning. Given first-degree AV block on electrocardiogram there is concern about abscess. This will be further evaluated with transesophageal echocardiogram (and possible fistula from  LVOT to RV). He will need to be followed by cardiothoracic surgery. He may well need aortic and tricuspid valve replacement. Olga Millers

## 2013-12-29 NOTE — Progress Notes (Signed)
eLink Physician-Brief Progress Note Patient Name: [name] DOB: 09/09/1985 MRN: [id number]   Date of Service  12/29/2013  HPI/Events of Note  Patient slowly dropping BP, septic with endocarditis and meningitis.  eICU Interventions  Gentle fluid boluses and placement of arterial line     Intervention Category Intermediate Interventions: Hypotension - evaluation and management  [name] 12/29/2013, 8:56 PM

## 2013-12-29 NOTE — Progress Notes (Signed)
Called into patient room. Patient stated, How long do I have to wear this thing. It's getting on my nerves. Education provided and reported to oncoming nurse about patient concerns. Will continue to monitor patient status. Melany Guernsey

## 2013-12-29 NOTE — Progress Notes (Signed)
During assessment patient noted to have increased respirations. Education provided about cooling the air in room to help promote better breathing status. Patient observed mouth breathing and taking in shallow rapid breaths. Education provided on slowing breathing down and inhaling through the nose and out of the mouth. Patient was able to demonstrate and lower breathing rate and increase O2 stats effectively. Will continue to monitor patient status. Melany Guernsey

## 2013-12-29 NOTE — Progress Notes (Signed)
Reviewed patient's echo with Dr Tyrone Sage; he will evaluate; pt transferred to ICU; still dyspneic; plan to intubate and then proceed with TEE to assess aortic and tricuspid valves, R/O abscess and assess for LVOT to RV fistula. Olga Millers

## 2013-12-29 NOTE — Progress Notes (Signed)
ANTIBIOTIC CONSULT NOTE - INITIAL  Pharmacy Consult for vancomycin Indication: Group B Strep disseminated infection  No Known Allergies  Patient Measurements: Height: 5\' 5"  (165.1 cm) Weight: 212 lb 15.4 oz (96.6 kg) IBW/kg (Calculated) : 61.5  Vital Signs: Temp: 100.7 F (38.2 C) (11/17 0839) Temp Source: Axillary (11/17 0839) BP: 119/36 mmHg (11/17 0839) Pulse Rate: 110 (11/17 0839) Intake/Output from previous day: 11/16 0701 - 11/17 0700 In: 450 [I.V.:400; IV Piggyback:50] Out: -  Intake/Output from this shift: Total I/O In: -  Out: 400 [Urine:400]  Labs:  Recent Labs  12/27/13 0436 12/28/13 0320 12/29/13 0331  WBC 22.4* 32.3* 24.6*  HGB 12.4* 12.0* 10.7*  PLT 85* 101* 167  CREATININE 1.34 1.60* 1.47*   Estimated Creatinine Clearance: 81.3 mL/min (by C-G formula based on Cr of 1.47). No results for input(s): VANCOTROUGH, VANCOPEAK, VANCORANDOM, GENTTROUGH, GENTPEAK, GENTRANDOM, TOBRATROUGH, TOBRAPEAK, TOBRARND, AMIKACINPEAK, AMIKACINTROU, AMIKACIN in the last 72 hours.   Microbiology: Recent Results (from the past 720 hour(s))  CSF culture     Status: None   Collection Time: 12/23/13 12:20 PM  Result Value Ref Range Status   Specimen Description CSF  Final   Special Requests NONE  Final   Gram Stain   Final    CYOSPIN NO WBC SEEN NO ORGANISMS SEEN Performed at [facility]    Culture   Final    FEW GROUP B STREP(S.AGALACTIAE)ISOLATED Note: CRITICAL RESULT CALLED TO, READ BACK BY AND VERIFIED WITH: STEPHANIE SHAW @ 0744 ON [id number] BY [facility] Performed at [facility]    Report Status 12/25/2013 FINAL  Final   Organism ID, Bacteria GROUP B STREP(S.AGALACTIAE)ISOLATED  Final      Susceptibility   Group b strep(s.agalactiae)isolated - MIC*    ERYTHROMYCIN >=8 RESISTANT Resistant     PENICILLIN <=0.06 SENSITIVE Sensitive     VANCOMYCIN 0.5 SENSITIVE Sensitive     AMPICILLIN <=0.25 SENSITIVE Sensitive     * FEW GROUP B  STREP(S.AGALACTIAE)ISOLATED  MRSA PCR Screening     Status: None   Collection Time: 12/23/13  5:27 PM  Result Value Ref Range Status   MRSA by PCR NEGATIVE NEGATIVE Final    Comment:        The GeneXpert MRSA Assay (FDA approved for NASAL specimens only), is one component of a comprehensive MRSA colonization surveillance program. It is not intended to diagnose MRSA infection nor to guide or monitor treatment for MRSA infections.   Culture, blood (routine x 2)     Status: None (Preliminary result)   Collection Time: 12/24/13 10:50 AM  Result Value Ref Range Status   Specimen Description BLOOD RIGHT HAND  Final   Special Requests   Final    BOTTLES DRAWN AEROBIC AND ANAEROBIC 6CC AER 5CC ANA   Culture  Setup Time   Final    12/24/2013 18:48 Performed at [facility]    Culture   Final           BLOOD CULTURE RECEIVED NO GROWTH TO DATE CULTURE WILL BE HELD FOR 5 DAYS BEFORE ISSUING A FINAL NEGATIVE REPORT Performed at [facility]    Report Status PENDING  Incomplete  Culture, blood (routine x 2)     Status: None (Preliminary result)   Collection Time: 12/24/13 10:58 AM  Result Value Ref Range Status   Specimen Description BLOOD LEFT ANTECUBITAL  Final   Special Requests BOTTLES DRAWN AEROBIC AND ANAEROBIC 10CC  Final   Culture  Setup Time  Final    12/24/2013 18:48 [facility]    Culture   Final           BLOOD CULTURE RECEIVED NO GROWTH TO DATE CULTURE WILL BE HELD FOR 5 DAYS BEFORE ISSUING A FINAL NEGATIVE REPORT [facility]    Report Status PENDING  Incomplete  Culture, blood (routine x 2)     Status: None (Preliminary result)   Collection Time: 12/28/13 11:50 AM  Result Value Ref Range Status   Specimen Description BLOOD LEFT ANTECUBITAL  Final   Special Requests BOTTLES DRAWN AEROBIC AND ANAEROBIC 10CC  Final   Culture  Setup Time   Final    12/28/2013 16:46 [facility]     Culture   Final           BLOOD CULTURE RECEIVED NO GROWTH TO DATE CULTURE WILL BE HELD FOR 5 DAYS BEFORE ISSUING A FINAL NEGATIVE REPORT [facility]    Report Status PENDING  Incomplete  Culture, blood (routine x 2)     Status: None (Preliminary result)   Collection Time: 12/28/13 12:05 PM  Result Value Ref Range Status   Specimen Description BLOOD LEFT HAND  Final   Special Requests BOTTLES DRAWN AEROBIC ONLY 10CC  Final   Culture  Setup Time   Final    12/28/2013 16:46 [facility] at [facility]    Culture   Final           BLOOD CULTURE RECEIVED NO GROWTH TO DATE CULTURE WILL BE HELD FOR 5 DAYS BEFORE ISSUING A FINAL NEGATIVE REPORT [facility] at [facility]    Report Status PENDING  Incomplete    Medical History: History reviewed. No pertinent past medical history.  Medications:  Prescriptions prior to admission  Medication Sig Dispense Refill Last Dose  . HYDROcodone-acetaminophen (NORCO/VICODIN) 5-325 MG per tablet Take 2 tablets by mouth every 4 (four) hours as needed. As needed for pain   12/23/2013 at Unknown time  . ondansetron (ZOFRAN-ODT) 4 MG disintegrating tablet Take 4 mg by mouth every 6 (six) hours.   12/22/2013 at Unknown time  . prochlorperazine (COMPAZINE) 10 MG tablet Take 1 tablet by mouth 3 (three) times daily.   12/22/2013 at Unknown time  . TAMIFLU 75 MG capsule Take 75 mg by mouth 2 (two) times daily.   Past Week at Unknown time   Assessment: 28 y/o male with Group B Strep meningitis, bacteremia, brain abscess, and endocarditis to switch from ceftriaxone to vancomycin and cefepime for worsening leukocytosis and respiratory distress. There is concern for HCAP vs PE with ARDS. He is febrile with a Tmax 102.6, WBC are elevated, and SCr is elevated above baseline (no UOP recorded yesterday).   Cefepime 11/17>> Vancomycin 11/11 >>11/12; 11/17>> Ceftriaxone 11/11>>11/17 Acyclovir 11/11>>11/12  11/16 BCx2 - ngtd 11/12  BCx2 - ngtd 11/11 CSF Cx >> Group B strep (sens to Amp/PCN/Vanc, Res eryth) 11/11 BCx >> NGTD  Goal of Therapy:  Vancomycin trough level 15-20 mcg/ml Eradication of infection  Plan:  - Vancomycin 2 g IV load ordered by ID then 1500 mg IV q12h - Check trough at steady-state - Monitor renal function, clinical progress, and culture data  [facility] HealthJennifer , CampbellPharm.D., BCPS Clinical Pharmacist Pager: [phone] 12/29/2013 1:09 PM

## 2013-12-29 NOTE — Progress Notes (Signed)
Call made to MRI to advised to come get patient twenty minutes after premedication and was informed that MRI would be done in the am. Will continue to monitor patient status. Melany Guernsey

## 2013-12-29 NOTE — Procedures (Signed)
Central Venous Catheter Insertion Procedure Note [name] [id number] March 18, 1985  Procedure: Insertion of Central Venous Catheter Indications: Assessment of intravascular volume, Drug and/or fluid administration and Frequent blood sampling  Procedure Details Consent: Risks of procedure as well as the alternatives and risks of each were explained to the (patient/caregiver).  Consent for procedure obtained. Time Out: Verified patient identification, verified procedure, site/side was marked, verified correct patient position, special equipment/implants available, medications/allergies/relevent history reviewed, required imaging and test results available.  Performed Real time [place] used to ID and cannulate  Maximum sterile technique was used including antiseptics, cap, gloves, gown, hand hygiene, mask and sheet. Skin prep: Chlorhexidine; local anesthetic administered A antimicrobial bonded/coated triple lumen catheter was placed in the left internal jugular vein using the Seldinger technique.  Evaluation Blood flow good Complications: No apparent complications Patient did tolerate procedure well. Did vomit at completion of procedure. Was gagging on tube. Able to suction oral cavity quickly. OGT placed.  Chest X-ray ordered to verify placement.  CXR: pending.  [name],[name] 12/29/2013, 4:01 PM  U/S used in placement.  I was present and supervised the entire procedure.  Alyson Reedy, M.D. [facility]monary/Critical [facility] Medicine. Pager: [phone]. After hours pager: [phone].

## 2013-12-30 ENCOUNTER — Inpatient Hospital Stay (HOSPITAL_COMMUNITY): Payer: BC Managed Care – PPO

## 2013-12-30 ENCOUNTER — Encounter (HOSPITAL_COMMUNITY)
Admission: EM | Disposition: A | Discharge status: Home Health | Payer: BC Managed Care – PPO | Source: Home / Self Care | Attending: Cardiothoracic Surgery

## 2013-12-30 ENCOUNTER — Inpatient Hospital Stay (HOSPITAL_COMMUNITY): Payer: BC Managed Care – PPO | Admitting: Anesthesiology

## 2013-12-30 ENCOUNTER — Encounter (HOSPITAL_COMMUNITY)
Admission: EM | Disposition: A | Discharge status: Home Health | Payer: Self-pay | Source: Home / Self Care | Attending: Cardiothoracic Surgery

## 2013-12-30 DIAGNOSIS — J9600 Acute respiratory failure, unspecified whether with hypoxia or hypercapnia: Secondary | ICD-10-CM | POA: Insufficient documentation

## 2013-12-30 DIAGNOSIS — Q211 Atrial septal defect: Secondary | ICD-10-CM

## 2013-12-30 DIAGNOSIS — I351 Nonrheumatic aortic (valve) insufficiency: Secondary | ICD-10-CM | POA: Diagnosis present

## 2013-12-30 DIAGNOSIS — Z954 Presence of other heart-valve replacement: Secondary | ICD-10-CM

## 2013-12-30 HISTORY — DX: Presence of other heart-valve replacement: Z95.4

## 2013-12-30 HISTORY — PX: INTRAOPERATIVE TRANSESOPHAGEAL ECHOCARDIOGRAM: SHX5062

## 2013-12-30 HISTORY — PX: ASCENDING AORTIC ROOT REPLACEMENT: SHX5729

## 2013-12-30 HISTORY — PX: TRICUSPID VALVE REPLACEMENT: SHX816

## 2013-12-30 LAB — BASIC METABOLIC PANEL
Anion gap: 12 (ref 5–15)
BUN: 33 mg/dL — ABNORMAL HIGH (ref 6–23)
CO2: 18 mEq/L — ABNORMAL LOW (ref 19–32)
Calcium: 8.5 mg/dL (ref 8.4–10.5)
Chloride: 102 mEq/L (ref 96–112)
Creatinine, Ser: 2.11 mg/dL — ABNORMAL HIGH (ref 0.50–1.35)
GFR calc Af Amer: 48 mL/min — ABNORMAL LOW (ref >90)
GFR calc non Af Amer: 42 mL/min — ABNORMAL LOW (ref >90)
Glucose, Bld: 115 mg/dL — ABNORMAL HIGH (ref 70–99)
Potassium: 4.7 mEq/L (ref 3.7–5.3)
Sodium: 132 mEq/L — ABNORMAL LOW (ref 137–147)

## 2013-12-30 LAB — POCT I-STAT, CHEM 8
BUN: 34 mg/dL — ABNORMAL HIGH (ref 6–23)
BUN: 36 mg/dL — AB (ref 6–23)
BUN: 32 mg/dL — ABNORMAL HIGH (ref 6–23)
BUN: 32 mg/dL — ABNORMAL HIGH (ref 6–23)
BUN: 34 mg/dL — ABNORMAL HIGH (ref 6–23)
BUN: 35 mg/dL — ABNORMAL HIGH (ref 6–23)
BUN: 33 mg/dL — AB (ref 6–23)
BUN: 33 mg/dL — ABNORMAL HIGH (ref 6–23)
CHLORIDE: 108 meq/L (ref 96–112)
CHLORIDE: 101 meq/L (ref 96–112)
CHLORIDE: 103 meq/L (ref 96–112)
CHLORIDE: 104 meq/L (ref 96–112)
CREATININE: 2.5 mg/dL — AB (ref 0.50–1.35)
CREATININE: 2.4 mg/dL — AB (ref 0.50–1.35)
Calcium, Ion: 1.08 mmol/L — ABNORMAL LOW (ref 1.12–1.23)
Calcium, Ion: 1.06 mmol/L — ABNORMAL LOW (ref 1.12–1.23)
Calcium, Ion: 0.89 mmol/L — ABNORMAL LOW (ref 1.12–1.23)
Calcium, Ion: 0.91 mmol/L — ABNORMAL LOW (ref 1.12–1.23)
Calcium, Ion: 0.97 mmol/L — ABNORMAL LOW (ref 1.12–1.23)
Calcium, Ion: 0.96 mmol/L — ABNORMAL LOW (ref 1.12–1.23)
Calcium, Ion: 1 mmol/L — ABNORMAL LOW (ref 1.12–1.23)
Calcium, Ion: 1.08 mmol/L — ABNORMAL LOW (ref 1.12–1.23)
Chloride: 104 mEq/L (ref 96–112)
Chloride: 105 mEq/L (ref 96–112)
Chloride: 100 mEq/L (ref 96–112)
Chloride: 99 mEq/L (ref 96–112)
Creatinine, Ser: 2.6 mg/dL — ABNORMAL HIGH (ref 0.50–1.35)
Creatinine, Ser: 2.6 mg/dL — ABNORMAL HIGH (ref 0.50–1.35)
Creatinine, Ser: 2.1 mg/dL — ABNORMAL HIGH (ref 0.50–1.35)
Creatinine, Ser: 2.2 mg/dL — ABNORMAL HIGH (ref 0.50–1.35)
Creatinine, Ser: 2.1 mg/dL — ABNORMAL HIGH (ref 0.50–1.35)
Creatinine, Ser: 1.9 mg/dL — ABNORMAL HIGH (ref 0.50–1.35)
GLUCOSE: 146 mg/dL — AB (ref 70–99)
Glucose, Bld: 99 mg/dL (ref 70–99)
Glucose, Bld: 105 mg/dL — ABNORMAL HIGH (ref 70–99)
Glucose, Bld: 129 mg/dL — ABNORMAL HIGH (ref 70–99)
Glucose, Bld: 152 mg/dL — ABNORMAL HIGH (ref 70–99)
Glucose, Bld: 162 mg/dL — ABNORMAL HIGH (ref 70–99)
Glucose, Bld: 97 mg/dL (ref 70–99)
Glucose, Bld: 128 mg/dL — ABNORMAL HIGH (ref 70–99)
HCT: 26 % — ABNORMAL LOW (ref 39.0–52.0)
HCT: 22 % — ABNORMAL LOW (ref 39.0–52.0)
HCT: 29 % — ABNORMAL LOW (ref 39.0–52.0)
HCT: 37 % — ABNORMAL LOW (ref 39.0–52.0)
HEMATOCRIT: 24 % — AB (ref 39.0–52.0)
HEMATOCRIT: 22 % — AB (ref 39.0–52.0)
HEMATOCRIT: 22 % — AB (ref 39.0–52.0)
HEMATOCRIT: 20 % — AB (ref 39.0–52.0)
HEMOGLOBIN: 8.2 g/dL — AB (ref 13.0–17.0)
HEMOGLOBIN: 7.5 g/dL — AB (ref 13.0–17.0)
HEMOGLOBIN: 7.5 g/dL — AB (ref 13.0–17.0)
HEMOGLOBIN: 12.6 g/dL — AB (ref 13.0–17.0)
Hemoglobin: 8.8 g/dL — ABNORMAL LOW (ref 13.0–17.0)
Hemoglobin: 7.5 g/dL — ABNORMAL LOW (ref 13.0–17.0)
Hemoglobin: 6.8 g/dL — CL (ref 13.0–17.0)
Hemoglobin: 9.9 g/dL — ABNORMAL LOW (ref 13.0–17.0)
POTASSIUM: 5.5 meq/L — AB (ref 3.7–5.3)
POTASSIUM: 4.7 meq/L (ref 3.7–5.3)
POTASSIUM: 5.8 meq/L — AB (ref 3.7–5.3)
POTASSIUM: 3.9 meq/L (ref 3.7–5.3)
POTASSIUM: 5.2 meq/L (ref 3.7–5.3)
Potassium: 4.9 mEq/L (ref 3.7–5.3)
Potassium: 5 mEq/L (ref 3.7–5.3)
Potassium: 4.8 mEq/L (ref 3.7–5.3)
SODIUM: 132 meq/L — AB (ref 137–147)
SODIUM: 130 meq/L — AB (ref 137–147)
SODIUM: 133 meq/L — AB (ref 137–147)
SODIUM: 137 meq/L (ref 137–147)
SODIUM: 137 meq/L (ref 137–147)
Sodium: 135 mEq/L — ABNORMAL LOW (ref 137–147)
Sodium: 133 mEq/L — ABNORMAL LOW (ref 137–147)
Sodium: 132 mEq/L — ABNORMAL LOW (ref 137–147)
TCO2: 17 mmol/L (ref 0–100)
TCO2: 18 mmol/L (ref 0–100)
TCO2: 19 mmol/L (ref 0–100)
TCO2: 21 mmol/L (ref 0–100)
TCO2: 22 mmol/L (ref 0–100)
TCO2: 20 mmol/L (ref 0–100)
TCO2: 19 mmol/L (ref 0–100)
TCO2: 20 mmol/L (ref 0–100)

## 2013-12-30 LAB — CBC
HCT: 27.2 % — ABNORMAL LOW (ref 39.0–52.0)
HCT: 36.6 % — ABNORMAL LOW (ref 39.0–52.0)
HEMATOCRIT: 36.7 % — AB (ref 39.0–52.0)
Hemoglobin: 9.2 g/dL — ABNORMAL LOW (ref 13.0–17.0)
Hemoglobin: 12.4 g/dL — ABNORMAL LOW (ref 13.0–17.0)
Hemoglobin: 12.6 g/dL — ABNORMAL LOW (ref 13.0–17.0)
MCH: 29.5 pg (ref 26.0–34.0)
MCH: 29 pg (ref 26.0–34.0)
MCH: 29.2 pg (ref 26.0–34.0)
MCHC: 33.8 g/dL (ref 30.0–36.0)
MCHC: 33.8 g/dL (ref 30.0–36.0)
MCHC: 34.4 g/dL (ref 30.0–36.0)
MCV: 87.2 fL (ref 78.0–100.0)
MCV: 85.7 fL (ref 78.0–100.0)
MCV: 84.7 fL (ref 78.0–100.0)
Platelets: 234 10*3/uL (ref 150–400)
Platelets: 81 10*3/uL — ABNORMAL LOW (ref 150–400)
Platelets: 110 10*3/uL — ABNORMAL LOW (ref 150–400)
RBC: 3.12 MIL/uL — ABNORMAL LOW (ref 4.22–5.81)
RBC: 4.28 MIL/uL (ref 4.22–5.81)
RBC: 4.32 MIL/uL (ref 4.22–5.81)
RDW: 14.1 % (ref 11.5–15.5)
RDW: 14 % (ref 11.5–15.5)
RDW: 13.7 % (ref 11.5–15.5)
WBC: 22.1 10*3/uL — ABNORMAL HIGH (ref 4.0–10.5)
WBC: 38.6 10*3/uL — ABNORMAL HIGH (ref 4.0–10.5)
WBC: 31.3 10*3/uL — ABNORMAL HIGH (ref 4.0–10.5)

## 2013-12-30 LAB — POCT I-STAT 3, ART BLOOD GAS (G3+)
ACID-BASE DEFICIT: 4 mmol/L — AB (ref 0.0–2.0)
Acid-base deficit: 6 mmol/L — ABNORMAL HIGH (ref 0.0–2.0)
Acid-base deficit: 7 mmol/L — ABNORMAL HIGH (ref 0.0–2.0)
Acid-base deficit: 5 mmol/L — ABNORMAL HIGH (ref 0.0–2.0)
BICARBONATE: 21.1 meq/L (ref 20.0–24.0)
BICARBONATE: 21.5 meq/L (ref 20.0–24.0)
Bicarbonate: 19.4 mEq/L — ABNORMAL LOW (ref 20.0–24.0)
Bicarbonate: 19.9 mEq/L — ABNORMAL LOW (ref 20.0–24.0)
O2 SAT: 100 %
O2 SAT: 93 %
O2 Saturation: 100 %
O2 Saturation: 98 %
PCO2 ART: 43.4 mmHg (ref 35.0–45.0)
PCO2 ART: 41.4 mmHg (ref 35.0–45.0)
PH ART: 7.324 — AB (ref 7.350–7.450)
PH ART: 7.314 — AB (ref 7.350–7.450)
PO2 ART: 346 mmHg — AB (ref 80.0–100.0)
PO2 ART: 65 mmHg — AB (ref 80.0–100.0)
Patient temperature: 35.5
TCO2: 20 mmol/L (ref 0–100)
TCO2: 21 mmol/L (ref 0–100)
TCO2: 22 mmol/L (ref 0–100)
TCO2: 23 mmol/L (ref 0–100)
pCO2 arterial: 37.2 mmHg (ref 35.0–45.0)
pCO2 arterial: 38.2 mmHg (ref 35.0–45.0)
pH, Arterial: 7.269 — ABNORMAL LOW (ref 7.350–7.450)
pH, Arterial: 7.351 (ref 7.350–7.450)
pO2, Arterial: 251 mmHg — ABNORMAL HIGH (ref 80.0–100.0)
pO2, Arterial: 114 mmHg — ABNORMAL HIGH (ref 80.0–100.0)

## 2013-12-30 LAB — CULTURE, BLOOD (ROUTINE X 2)
CULTURE: NO GROWTH
Culture: NO GROWTH

## 2013-12-30 LAB — GRAM STAIN

## 2013-12-30 LAB — PLATELET COUNT: Platelets: 110 10*3/uL — ABNORMAL LOW (ref 150–400)

## 2013-12-30 LAB — POCT I-STAT 4, (NA,K, GLUC, HGB,HCT)
Glucose, Bld: 80 mg/dL (ref 70–99)
HCT: 36 % — ABNORMAL LOW (ref 39.0–52.0)
HEMOGLOBIN: 12.2 g/dL — AB (ref 13.0–17.0)
Potassium: 3.6 mEq/L — ABNORMAL LOW (ref 3.7–5.3)
Sodium: 139 mEq/L (ref 137–147)

## 2013-12-30 LAB — HEMOGLOBIN AND HEMATOCRIT, BLOOD
HCT: 21.8 % — ABNORMAL LOW (ref 39.0–52.0)
Hemoglobin: 7.3 g/dL — ABNORMAL LOW (ref 13.0–17.0)

## 2013-12-30 LAB — PROTIME-INR
INR: 2.76 — ABNORMAL HIGH (ref 0.00–1.49)
Prothrombin Time: 29.4 seconds — ABNORMAL HIGH (ref 11.6–15.2)

## 2013-12-30 LAB — APTT: APTT: 44 s — AB (ref 24–37)

## 2013-12-30 LAB — URINE CULTURE
COLONY COUNT: NO GROWTH
Culture: NO GROWTH

## 2013-12-30 LAB — PREPARE RBC (CROSSMATCH)

## 2013-12-30 LAB — CREATININE, SERUM
Creatinine, Ser: 1.78 mg/dL — ABNORMAL HIGH (ref 0.50–1.35)
GFR calc Af Amer: 59 mL/min — ABNORMAL LOW (ref >90)
GFR calc non Af Amer: 51 mL/min — ABNORMAL LOW (ref >90)

## 2013-12-30 LAB — MAGNESIUM: Magnesium: 2.4 mg/dL (ref 1.5–2.5)

## 2013-12-30 SURGERY — ECHOCARDIOGRAM, TRANSESOPHAGEAL
Anesthesia: Moderate Sedation

## 2013-12-30 SURGERY — ASCENDING AORTIC ROOT REPLACEMENT
Anesthesia: General | Site: Chest

## 2013-12-30 MED ORDER — ACETAMINOPHEN 500 MG PO TABS
1000.0000 mg | ORAL_TABLET | Freq: Four times a day (QID) | ORAL | Status: DC
  Filled 2013-12-30 (×16): qty 2

## 2013-12-30 MED ORDER — LACTATED RINGERS IV SOLN
INTRAVENOUS | Status: DC | PRN
  Administered 2013-12-30: 08:00:00 via INTRAVENOUS

## 2013-12-30 MED ORDER — SODIUM CHLORIDE 0.9 % IV SOLN
INTRAVENOUS | Status: DC
  Administered 2013-12-30: 1 [IU]/h via INTRAVENOUS
  Administered 2013-12-30: via INTRAVENOUS
  Filled 2013-12-30: qty 2.5

## 2013-12-30 MED ORDER — INSULIN REGULAR BOLUS VIA INFUSION
0.0000 [IU] | Freq: Three times a day (TID) | INTRAVENOUS | Status: DC
  Administered 2013-12-31: 0 [IU] via INTRAVENOUS
  Filled 2013-12-30: qty 10

## 2013-12-30 MED ORDER — MAGNESIUM SULFATE 4 GM/100ML IV SOLN: 4.0000 g | Freq: Once | INTRAVENOUS | Status: DC

## 2013-12-30 MED ORDER — METOPROLOL TARTRATE 1 MG/ML IV SOLN: 2.5000 mg | INTRAVENOUS | Status: DC | PRN

## 2013-12-30 MED ORDER — MIDAZOLAM HCL 5 MG/5ML IJ SOLN
INTRAMUSCULAR | Status: DC | PRN
  Administered 2013-12-30 (×4): 5 mg via INTRAVENOUS

## 2013-12-30 MED ORDER — ACETAMINOPHEN 160 MG/5ML PO SOLN
1000.0000 mg | Freq: Four times a day (QID) | ORAL | Status: DC
  Administered 2013-12-31 – 2014-01-01 (×6): 1000 mg
  Filled 2013-12-30 (×6): qty 40.6

## 2013-12-30 MED ORDER — MORPHINE SULFATE 2 MG/ML IJ SOLN
1.0000 mg | INTRAMUSCULAR | Status: AC | PRN
  Administered 2013-12-30 (×2): 2 mg via INTRAVENOUS
  Administered 2013-12-31 (×2): 4 mg via INTRAVENOUS
  Filled 2013-12-30: qty 1

## 2013-12-30 MED ORDER — DEXTROSE 5 % IV SOLN
0.0000 ug/min | INTRAVENOUS | Status: DC
  Filled 2013-12-30: qty 4

## 2013-12-30 MED ORDER — SODIUM CHLORIDE 0.9 % IV SOLN
0.0000 ug/min | INTRAVENOUS | Status: DC
  Filled 2013-12-30 (×2): qty 4

## 2013-12-30 MED ORDER — MIDAZOLAM HCL 2 MG/2ML IJ SOLN
2.0000 mg | INTRAMUSCULAR | Status: DC | PRN
  Administered 2013-12-30 – 2013-12-31 (×2): 2 mg via INTRAVENOUS
  Filled 2013-12-30 (×3): qty 2

## 2013-12-30 MED ORDER — BISACODYL 5 MG PO TBEC
10.0000 mg | DELAYED_RELEASE_TABLET | Freq: Every day | ORAL | Status: DC
  Administered 2014-01-02 – 2014-01-03 (×2): 10 mg via ORAL
  Filled 2013-12-30 (×3): qty 2

## 2013-12-30 MED ORDER — SUCCINYLCHOLINE CHLORIDE 20 MG/ML IJ SOLN
INTRAMUSCULAR | Status: AC
  Filled 2013-12-30: qty 1

## 2013-12-30 MED ORDER — ASPIRIN EC 325 MG PO TBEC
325.0000 mg | DELAYED_RELEASE_TABLET | Freq: Every day | ORAL | Status: DC
Start: 2013-12-31 — End: 2013-12-30

## 2013-12-30 MED ORDER — TRAMADOL HCL 50 MG PO TABS: 50.0000 mg | ORAL_TABLET | ORAL | Status: DC | PRN

## 2013-12-30 MED ORDER — SODIUM CHLORIDE 0.45 % IV SOLN
INTRAVENOUS | Status: DC
  Administered 2013-12-30: 20 mL/h via INTRAVENOUS

## 2013-12-30 MED ORDER — ASPIRIN 81 MG PO CHEW: 324.0000 mg | CHEWABLE_TABLET | Freq: Every day | ORAL | Status: DC

## 2013-12-30 MED ORDER — DEXMEDETOMIDINE HCL IN NACL 400 MCG/100ML IV SOLN
0.0000 ug/kg/h | INTRAVENOUS | Status: DC
  Administered 2013-12-30: 1 ug/kg/h via INTRAVENOUS
  Administered 2013-12-30: 0.7 ug/kg/h via INTRAVENOUS
  Administered 2013-12-31 – 2014-01-01 (×7): 1.2 ug/kg/h via INTRAVENOUS
  Administered 2014-01-01: 0.5 ug/kg/h via INTRAVENOUS
  Filled 2013-12-30 (×12): qty 100

## 2013-12-30 MED ORDER — ROCURONIUM BROMIDE 100 MG/10ML IV SOLN
INTRAVENOUS | Status: DC | PRN
  Administered 2013-12-30 (×5): 50 mg via INTRAVENOUS

## 2013-12-30 MED ORDER — FAMOTIDINE IN NACL 20-0.9 MG/50ML-% IV SOLN
20.0000 mg | Freq: Two times a day (BID) | INTRAVENOUS | Status: AC
  Administered 2013-12-30 – 2013-12-31 (×2): 20 mg via INTRAVENOUS
  Filled 2013-12-30: qty 50

## 2013-12-30 MED ORDER — PROTAMINE SULFATE 10 MG/ML IV SOLN
INTRAVENOUS | Status: DC | PRN
  Administered 2013-12-30: 340 mg via INTRAVENOUS

## 2013-12-30 MED ORDER — SODIUM CHLORIDE 0.9 % IV SOLN
INTRAVENOUS | Status: DC
  Administered 2013-12-30: 20 mL/h via INTRAVENOUS

## 2013-12-30 MED ORDER — FENTANYL CITRATE 0.05 MG/ML IJ SOLN
INTRAMUSCULAR | Status: AC
  Filled 2013-12-30: qty 5

## 2013-12-30 MED ORDER — DEXMEDETOMIDINE HCL IN NACL 200 MCG/50ML IV SOLN
0.0000 ug/kg/h | INTRAVENOUS | Status: DC
  Administered 2013-12-30: 0.7 ug/kg/h via INTRAVENOUS

## 2013-12-30 MED ORDER — HEPARIN SODIUM (PORCINE) 1000 UNIT/ML IJ SOLN
INTRAMUSCULAR | Status: AC
Start: 2013-12-30 — End: 2013-12-30
  Filled 2013-12-30: qty 1

## 2013-12-30 MED ORDER — MORPHINE SULFATE 2 MG/ML IJ SOLN
2.0000 mg | INTRAMUSCULAR | Status: DC | PRN
  Administered 2013-12-31 (×2): 4 mg via INTRAVENOUS
  Administered 2014-01-01 – 2014-01-04 (×4): 2 mg via INTRAVENOUS
  Administered 2014-01-05: 4 mg via INTRAVENOUS
  Administered 2014-01-05: 2 mg via INTRAVENOUS
  Filled 2013-12-30: qty 2
  Filled 2013-12-30: qty 1
  Filled 2013-12-30 (×2): qty 2
  Filled 2013-12-30: qty 1
  Filled 2013-12-30 (×3): qty 2
  Filled 2013-12-30 (×2): qty 1

## 2013-12-30 MED ORDER — 0.9 % SODIUM CHLORIDE (POUR BTL) OPTIME
TOPICAL | Status: DC | PRN
  Administered 2013-12-30: 7000 mL

## 2013-12-30 MED ORDER — PROPOFOL 10 MG/ML IV BOLUS
INTRAVENOUS | Status: AC
  Filled 2013-12-30: qty 20

## 2013-12-30 MED ORDER — ACETAMINOPHEN 650 MG RE SUPP
650.0000 mg | Freq: Once | RECTAL | Status: AC
  Administered 2013-12-30: 650 mg via RECTAL

## 2013-12-30 MED ORDER — SODIUM CHLORIDE 0.9 % IJ SOLN
3.0000 mL | Freq: Two times a day (BID) | INTRAMUSCULAR | Status: DC
  Administered 2013-12-31 – 2014-01-02 (×3): 3 mL via INTRAVENOUS
  Administered 2014-01-05: 10 mL via INTRAVENOUS

## 2013-12-30 MED ORDER — BISACODYL 10 MG RE SUPP: 10.0000 mg | Freq: Every day | RECTAL | Status: DC

## 2013-12-30 MED ORDER — FENTANYL CITRATE 0.05 MG/ML IJ SOLN
INTRAMUSCULAR | Status: DC | PRN
  Administered 2013-12-30: 250 ug via INTRAVENOUS
  Administered 2013-12-30: 350 ug via INTRAVENOUS
  Administered 2013-12-30: 250 ug via INTRAVENOUS
  Administered 2013-12-30: 150 ug via INTRAVENOUS
  Administered 2013-12-30: 250 ug via INTRAVENOUS
  Administered 2013-12-30: 500 ug via INTRAVENOUS

## 2013-12-30 MED ORDER — ROCURONIUM BROMIDE 50 MG/5ML IV SOLN
INTRAVENOUS | Status: AC
  Filled 2013-12-30: qty 1

## 2013-12-30 MED ORDER — HEMOSTATIC AGENTS (NO CHARGE) OPTIME
TOPICAL | Status: DC | PRN
  Administered 2013-12-30: 1 via TOPICAL

## 2013-12-30 MED ORDER — CEFTRIAXONE SODIUM IN DEXTROSE 40 MG/ML IV SOLN
2.0000 g | Freq: Two times a day (BID) | INTRAVENOUS | Status: DC
  Administered 2013-12-30 – 2014-01-07 (×16): 2 g via INTRAVENOUS
  Filled 2013-12-30 (×18): qty 50

## 2013-12-30 MED ORDER — SODIUM CHLORIDE 0.9 % IV SOLN: 250.0000 mL | INTRAVENOUS | Status: DC

## 2013-12-30 MED ORDER — LACTATED RINGERS IV SOLN: 500.0000 mL | Freq: Once | INTRAVENOUS | Status: AC | PRN

## 2013-12-30 MED ORDER — POTASSIUM CHLORIDE 10 MEQ/50ML IV SOLN
10.0000 meq | INTRAVENOUS | Status: AC
  Administered 2013-12-30 (×3): 10 meq via INTRAVENOUS

## 2013-12-30 MED ORDER — ONDANSETRON HCL 4 MG/2ML IJ SOLN
4.0000 mg | Freq: Four times a day (QID) | INTRAMUSCULAR | Status: DC | PRN
  Administered 2014-01-01: 4 mg via INTRAVENOUS
  Filled 2013-12-30 (×2): qty 2

## 2013-12-30 MED ORDER — ALBUMIN HUMAN 5 % IV SOLN
250.0000 mL | INTRAVENOUS | Status: AC | PRN
  Administered 2013-12-30 – 2013-12-31 (×3): 250 mL via INTRAVENOUS
  Filled 2013-12-30: qty 250

## 2013-12-30 MED ORDER — LIDOCAINE HCL (CARDIAC) 20 MG/ML IV SOLN
INTRAVENOUS | Status: AC
Start: 2013-12-30 — End: 2013-12-30
  Filled 2013-12-30: qty 5

## 2013-12-30 MED ORDER — SODIUM CHLORIDE 0.9 % IJ SOLN: 3.0000 mL | INTRAMUSCULAR | Status: DC | PRN

## 2013-12-30 MED ORDER — MIDAZOLAM HCL 10 MG/2ML IJ SOLN
INTRAMUSCULAR | Status: AC
  Filled 2013-12-30: qty 2

## 2013-12-30 MED ORDER — NITROGLYCERIN IN D5W 200-5 MCG/ML-% IV SOLN
0.0000 ug/min | INTRAVENOUS | Status: DC
  Administered 2013-12-30: 0 ug/min via INTRAVENOUS

## 2013-12-30 MED ORDER — DEXMEDETOMIDINE HCL IN NACL 200 MCG/50ML IV SOLN
0.4000 ug/kg/h | INTRAVENOUS | Status: DC
Start: 2013-12-30 — End: 2013-12-30
  Filled 2013-12-30: qty 50

## 2013-12-30 MED ORDER — ARTIFICIAL TEARS OP OINT
TOPICAL_OINTMENT | OPHTHALMIC | Status: AC
  Filled 2013-12-30: qty 3.5

## 2013-12-30 MED ORDER — VANCOMYCIN HCL IN DEXTROSE 1-5 GM/200ML-% IV SOLN
1000.0000 mg | Freq: Two times a day (BID) | INTRAVENOUS | Status: DC
  Administered 2013-12-30 – 2013-12-31 (×2): 1000 mg via INTRAVENOUS
  Filled 2013-12-30 (×5): qty 200

## 2013-12-30 MED ORDER — SODIUM CHLORIDE 0.9 % IJ SOLN
INTRAMUSCULAR | Status: AC
  Filled 2013-12-30: qty 20

## 2013-12-30 MED ORDER — LACTATED RINGERS IV SOLN
INTRAVENOUS | Status: DC
  Administered 2013-12-30: 20 mL/h via INTRAVENOUS
  Administered 2013-12-31: via INTRAVENOUS
  Administered 2013-12-31: 10 mL via INTRAVENOUS

## 2013-12-30 MED ORDER — SODIUM CHLORIDE 0.9 % IV SOLN
INTRAVENOUS | Status: DC
  Filled 2013-12-30: qty 40

## 2013-12-30 MED ORDER — DOPAMINE-DEXTROSE 3.2-5 MG/ML-% IV SOLN
0.0000 ug/kg/min | INTRAVENOUS | Status: DC
  Administered 2013-12-30: 5 ug/kg/min via INTRAVENOUS

## 2013-12-30 MED ORDER — PANTOPRAZOLE SODIUM 40 MG PO TBEC: 40.0000 mg | DELAYED_RELEASE_TABLET | Freq: Every day | ORAL | Status: DC

## 2013-12-30 MED ORDER — DOCUSATE SODIUM 100 MG PO CAPS
200.0000 mg | ORAL_CAPSULE | Freq: Every day | ORAL | Status: DC
  Administered 2014-01-02 – 2014-01-04 (×3): 200 mg via ORAL
  Filled 2013-12-30 (×4): qty 2

## 2013-12-30 MED ORDER — ACETAMINOPHEN 160 MG/5ML PO SOLN: 650.0000 mg | Freq: Once | ORAL | Status: AC

## 2013-12-30 MED ORDER — HEPARIN SODIUM (PORCINE) 1000 UNIT/ML IJ SOLN
INTRAMUSCULAR | Status: DC | PRN
  Administered 2013-12-30: 34000 [IU] via INTRAVENOUS

## 2013-12-30 MED ORDER — ROCURONIUM BROMIDE 50 MG/5ML IV SOLN
INTRAVENOUS | Status: AC
  Filled 2013-12-30: qty 4

## 2013-12-30 MED ORDER — SODIUM CHLORIDE 0.9 % IJ SOLN
OROMUCOSAL | Status: DC | PRN
  Administered 2013-12-30: 4 mL via TOPICAL

## 2013-12-30 MED ORDER — PHENYLEPHRINE HCL 10 MG/ML IJ SOLN
0.0000 ug/min | INTRAMUSCULAR | Status: DC
  Administered 2013-12-30: 10 ug/min via INTRAVENOUS
  Administered 2013-12-31: 25 ug/min via INTRAVENOUS
  Administered 2014-01-01: 10 ug/min via INTRAVENOUS
  Filled 2013-12-30 (×3): qty 2

## 2013-12-30 MED ORDER — OXYCODONE HCL 5 MG PO TABS: 5.0000 mg | ORAL_TABLET | ORAL | Status: DC | PRN

## 2013-12-30 MED ORDER — ACETAMINOPHEN 160 MG/5ML PO SOLN
650.0000 mg | ORAL | Status: DC | PRN
  Administered 2013-12-30: 650 mg
  Filled 2013-12-30: qty 20.3

## 2013-12-30 SURGICAL SUPPLY — 108 items
ADAPTER CARDIO PERF ANTE/RETRO (ADAPTER) ×3 IMPLANT
ADAPTER MULTI PERFUSION 15 (ADAPTER) ×1 IMPLANT
ADPR PRFSN 84XANTGRD RTRGD (ADAPTER) ×2
APL SKNCLS STERI-STRIP NONHPOA (GAUZE/BANDAGES/DRESSINGS) ×2
APL SRG 22X2 LUM MLBL SLNT (VASCULAR PRODUCTS) ×2
APL SRG 7X2 LUM MLBL SLNT (VASCULAR PRODUCTS) ×2
APPLICATOR COTTON TIP 6IN STRL (MISCELLANEOUS) IMPLANT
APPLICATOR TIP COSEAL (VASCULAR PRODUCTS) ×1 IMPLANT
APPLICATOR TIP EXT COSEAL (VASCULAR PRODUCTS) ×1 IMPLANT
ATTRACTOMAT 16X20 MAGNETIC DRP (DRAPES) ×2 IMPLANT
BAG DECANTER FOR FLEXI CONT (MISCELLANEOUS) ×4 IMPLANT
BENZOIN TINCTURE PRP APPL 2/3 (GAUZE/BANDAGES/DRESSINGS) ×1 IMPLANT
BLADE STERNUM SYSTEM 6 (BLADE) ×3 IMPLANT
BLADE SURG 15 STRL LF DISP TIS (BLADE) ×2 IMPLANT
BLADE SURG 15 STRL SS (BLADE) ×3
BOOT SUTURE AID YELLOW STND (SUTURE) IMPLANT
CANISTER SUCTION 2500CC (MISCELLANEOUS) ×3 IMPLANT
CANN PRFSN .5XCNCT 15X34-48 (MISCELLANEOUS)
CANN PRFSN 3/8X14X24FR PCFC (MISCELLANEOUS) ×2
CANN PRFSN 3/8XCNCT ST RT ANG (MISCELLANEOUS) ×2
CANNULA ARTERIAL NVNT 3/8 20FR (MISCELLANEOUS) ×1 IMPLANT
CANNULA GUNDRY RCSP 15FR (MISCELLANEOUS) ×3 IMPLANT
CANNULA PRFSN .5XCNCT 15X34-48 (MISCELLANEOUS) ×2 IMPLANT
CANNULA PRFSN 3/8X14X24FR PCFC (MISCELLANEOUS) IMPLANT
CANNULA PRFSN 3/8XCNCT RT ANG (MISCELLANEOUS) IMPLANT
CANNULA VEN 2 STAGE (MISCELLANEOUS)
CANNULA VEN MTL TIP RT (MISCELLANEOUS) ×6
CATH HEART VENT LEFT (CATHETERS) ×2 IMPLANT
CATH MALECOT BARD  28FR (CATHETERS) ×2 IMPLANT
CATH RETROPLEGIA CORONARY 14FR (CATHETERS) IMPLANT
CATH/SQUID NICHOLS JEHLE COR (CATHETERS) ×1 IMPLANT
CONN ST 1/4X3/8  BEN (MISCELLANEOUS) ×1
CONN ST 1/4X3/8 BEN (MISCELLANEOUS) IMPLANT
CONT SPEC 4OZ CLIKSEAL STRL BL (MISCELLANEOUS) ×4 IMPLANT
COVER SURGICAL LIGHT HANDLE (MISCELLANEOUS) ×2 IMPLANT
CRADLE DONUT ADULT HEAD (MISCELLANEOUS) ×4 IMPLANT
DRAIN CHANNEL 32F RND 10.7 FF (WOUND CARE) ×3 IMPLANT
DRAPE CARDIOVASCULAR INCISE (DRAPES) ×3
DRAPE SLUSH/WARMER DISC (DRAPES) ×3 IMPLANT
DRAPE SRG 135X102X78XABS (DRAPES) ×2 IMPLANT
DRSG AQUACEL AG ADV 3.5X14 (GAUZE/BANDAGES/DRESSINGS) ×3 IMPLANT
ELECT BLADE 4.0 EZ CLEAN MEGAD (MISCELLANEOUS) ×3
ELECT CAUTERY BLADE 6.4 (BLADE) ×3 IMPLANT
ELECT REM PT RETURN 9FT ADLT (ELECTROSURGICAL) ×6
ELECTRODE BLDE 4.0 EZ CLN MEGD (MISCELLANEOUS) ×2 IMPLANT
ELECTRODE REM PT RTRN 9FT ADLT (ELECTROSURGICAL) ×4 IMPLANT
GAUZE SPONGE 4X4 12PLY STRL (GAUZE/BANDAGES/DRESSINGS) ×6 IMPLANT
GLOVE BIO SURGEON STRL SZ 6.5 (GLOVE) ×9 IMPLANT
GLOVE BIO SURGEON STRL SZ7 (GLOVE) ×1 IMPLANT
GLOVE BIO SURGEON STRL SZ8 (GLOVE) ×2 IMPLANT
GLOVE BIOGEL PI IND STRL 6 (GLOVE) IMPLANT
GLOVE BIOGEL PI IND STRL 6.5 (GLOVE) IMPLANT
GLOVE BIOGEL PI INDICATOR 6 (GLOVE) ×3
GLOVE BIOGEL PI INDICATOR 6.5 (GLOVE) ×1
GOWN STRL REUS W/ TWL LRG LVL3 (GOWN DISPOSABLE) ×8 IMPLANT
GOWN STRL REUS W/TWL LRG LVL3 (GOWN DISPOSABLE) ×30
HEMOSTAT POWDER SURGIFOAM 1G (HEMOSTASIS) ×9 IMPLANT
HEMOSTAT SURGICEL 2X14 (HEMOSTASIS) ×3 IMPLANT
INSERT FOGARTY SM (MISCELLANEOUS) ×1 IMPLANT
INSERT FOGARTY XLG (MISCELLANEOUS) ×1 IMPLANT
KIT BASIN OR (CUSTOM PROCEDURE TRAY) ×3 IMPLANT
KIT ROOM TURNOVER OR (KITS) ×3 IMPLANT
KIT SUCTION CATH 14FR (SUCTIONS) ×7 IMPLANT
LEAD PACING MYOCARDI (MISCELLANEOUS) ×4 IMPLANT
LINE VENT (MISCELLANEOUS) ×1 IMPLANT
LOOP VESSEL SUPERMAXI WHITE (MISCELLANEOUS) ×1 IMPLANT
NS IRRIG 1000ML POUR BTL (IV SOLUTION) ×19 IMPLANT
PACK OPEN HEART (CUSTOM PROCEDURE TRAY) ×3 IMPLANT
PAD ARMBOARD 7.5X6 YLW CONV (MISCELLANEOUS) ×7 IMPLANT
SET CARDIOPLEGIA MPS 5001102 (MISCELLANEOUS) ×1 IMPLANT
SPONGE GAUZE 4X4 12PLY STER LF (GAUZE/BANDAGES/DRESSINGS) ×1 IMPLANT
STRIP CLOSURE SKIN 1/2X4 (GAUZE/BANDAGES/DRESSINGS) ×1 IMPLANT
SUT ETHIBON 2 0 V 52N 30 (SUTURE) ×4 IMPLANT
SUT ETHIBOND 2 0 SH (SUTURE) ×18
SUT ETHIBOND 2 0 SH 36X2 (SUTURE) ×2 IMPLANT
SUT ETHIBOND 4 0 RB 1 (SUTURE) ×3 IMPLANT
SUT PROLENE 3 0 RB 1 (SUTURE) ×3 IMPLANT
SUT PROLENE 3 0 SH 1 (SUTURE) ×3 IMPLANT
SUT PROLENE 3 0 SH1 36 (SUTURE) ×2 IMPLANT
SUT PROLENE 4 0 RB 1 (SUTURE) ×60
SUT PROLENE 4 0 SH DA (SUTURE) ×2 IMPLANT
SUT PROLENE 4-0 RB1 .5 CRCL 36 (SUTURE) ×4 IMPLANT
SUT PROLENE 5 0 C 1 36 (SUTURE) ×7 IMPLANT
SUT PROLENE 6 0 C 1 30 (SUTURE) ×2 IMPLANT
SUT SILK  1 MH (SUTURE) ×3
SUT SILK 1 MH (SUTURE) IMPLANT
SUT SILK 2 0 SH CR/8 (SUTURE) ×3 IMPLANT
SUT SILK 3 0 SH CR/8 (SUTURE) ×1 IMPLANT
SUT STEEL 6MS V (SUTURE) ×3 IMPLANT
SUT STEEL SZ 6 DBL 3X14 BALL (SUTURE) ×3 IMPLANT
SUT TEM PAC WIRE 2 0 SH (SUTURE) ×2 IMPLANT
SUT VIC AB 1 CTX 18 (SUTURE) ×6 IMPLANT
SUT VIC AB 2-0 CTX 36 (SUTURE) ×2 IMPLANT
SUT VIC AB 3-0 X1 27 (SUTURE) ×2 IMPLANT
SUTURE E-PAK OPEN HEART (SUTURE) ×2 IMPLANT
SYSTEM SAHARA CHEST DRAIN ATS (WOUND CARE) ×3 IMPLANT
TAPE CLOTH SURG 4X10 WHT LF (GAUZE/BANDAGES/DRESSINGS) ×1 IMPLANT
TOWEL OR 17X24 6PK STRL BLUE (TOWEL DISPOSABLE) ×5 IMPLANT
TOWEL OR 17X26 10 PK STRL BLUE (TOWEL DISPOSABLE) ×6 IMPLANT
TRAY FOLEY IC TEMP SENS 14FR (CATHETERS) ×2 IMPLANT
TUBE FEEDING 8FR 16IN STR KANG (MISCELLANEOUS) ×2 IMPLANT
TUBE SUCT INTRACARD DLP 20F (MISCELLANEOUS) ×1 IMPLANT
TUBE SUCTION CARDIAC 10FR (CANNULA) ×1 IMPLANT
UNDERPAD 30X30 INCONTINENT (UNDERPADS AND DIAPERS) ×3 IMPLANT
VALVE HEART CRYO AORTIC 23 (Prosthesis & Implant Heart) IMPLANT
VALVE HEART CRYO AORTIC 23MM (Prosthesis & Implant Heart) ×3 IMPLANT
VENT LEFT HEART 12002 (CATHETERS) ×3
WATER STERILE IRR 1000ML POUR (IV SOLUTION) ×6 IMPLANT

## 2013-12-30 NOTE — Progress Notes (Signed)
Echocardiogram Echocardiogram Transesophageal has been performed.  [name] 12/30/2013, 10:03 AM

## 2013-12-30 NOTE — Progress Notes (Addendum)
[facility]    Date of Admission:  12/23/2013   Total days of antibiotics 8        Day 2 vanco        Day 2 cefepime           ID: [name] is a 28 y.o. male with sepsis from disseminated group b strep infection from av endocarditis c/b cerebral emboli and meningitis, over the last 24-48 clinically decompensated due to severe AI, mod TR requiring intubation emergently went to OR POD #0 for AVR, ascending aortic root replacement with homograft and repair of septal leaflet tricuspid valve, closure of PFO Active Problems:   Thrombocytopenia   Meningitis due to bacterium   Acute renal failure syndrome   Dehydration   Sepsis due to group B Streptococcus   LFT elevation   Meningitis, streptococcal   Headache   Pain in joint, lower leg   Brain abscess   Acute respiratory failure with hypoxia   Acute endocarditis   Acute on chronic respiratory failure with hypoxia   HCAP (healthcare-associated pneumonia)   Acute systolic congestive heart failure   Meningitis   Bacterial endocarditis   Bilateral edema of lower extremity   Severe aortic insufficiency   Acute respiratory failure    Subjective: Afebrile, on dopamine to maintain CI, hemodynamically stable with good urine output, remains on vent  Medications:  . [START ON 12/31/2013] acetaminophen  1,000 mg Oral 4 times per day   Or  . [START ON 12/31/2013] acetaminophen (TYLENOL) oral liquid 160 mg/5 mL  1,000 mg Per Tube 4 times per day  . antiseptic oral rinse  7 mL Mouth Rinse QID  . [START ON 12/31/2013] bisacodyl  10 mg Oral Daily   Or  . [START ON 12/31/2013] bisacodyl  10 mg Rectal Daily  . cefTRIAXone (ROCEPHIN)  IV  2 g Intravenous Q12H  . chlorhexidine  15 mL Mouth Rinse BID  . [START ON 12/31/2013] docusate sodium  200 mg Oral Daily  . famotidine (PEPCID) IV  20 mg Intravenous Q12H  . insulin regular  0-10 Units Intravenous TID WC  . [START ON 01/01/2014] pantoprazole  40 mg Oral Daily    . potassium chloride  10 mEq Intravenous Q1 Hr x 3  . [START ON 12/31/2013] sodium chloride  3 mL Intravenous Q12H  . vancomycin  1,000 mg Intravenous Q12H    Objective: Vital signs in last 24 hours: Temp:  [95.9 F (35.5 C)-102.7 F (39.3 C)] 97 F (36.1 C) (11/18 1845) Pulse Rate:  [79-116] 86 (11/18 1917) Resp:  [12-35] 12 (11/18 1917) BP: (83-134)/(21-76) 90/58 mmHg (11/18 1917) SpO2:  [93 %-100 %] 95 % (11/18 1917) Arterial Line BP: (91-210)/(22-77) 91/57 mmHg (11/18 1845) FiO2 (%):  [40 %-50 %] 50 % (11/18 1917)  Not examined  Lab Results  Recent Labs  12/29/13 2200 12/30/13 0429  12/30/13 1445 12/30/13 1552 12/30/13 1650 12/30/13 1655  WBC  --  22.1*  --   --   --  38.6*  --   HGB  --  9.2*  < > 6.8* 9.9* 12.4* 12.2*  HCT  --  27.2*  < > 20.0* 29.0* 36.7* 36.0*  NA 133* 132*  < > 132* 137  --  139  K 4.8 4.7  < > 5.8* 3.9  --  3.6*  CL 103 102  < > 101 103  --   --   CO2 17* 18*  --   --   --   --   --  BUN 25* 33*  < > 35* 33*  --   --   CREATININE 1.87* 2.11*  < > 2.20* 2.10*  --   --   < > = values in this interval not displayed. Liver Panel  Recent Labs  12/29/13 0331 12/29/13 2200  PROT 5.1* 4.9*  ALBUMIN 1.9* 1.8*  AST 60* 293*  ALT 60* 262*  ALKPHOS 75 63  BILITOT 0.8 1.1   Sedimentation Rate No results for input(s): ESRSEDRATE in the last 72 hours. C-Reactive Protein No results for input(s): CRP in the last 72 hours.  Microbiology:  Studies/Results: Dg Chest Port 1 View  12/30/2013   CLINICAL DATA:  Endocarditis of aortic valve.  EXAM: PORTABLE CHEST - 1 VIEW  COMPARISON:  December 29, 2013.  FINDINGS: Endotracheal tube is in grossly good position with distal tip 2 cm above the carina. Nasogastric tube is seen entering the stomach. Left internal jugular catheter is unchanged in position with distal tip in expected position the SVC. There is been interval placement of right internal jugular catheter with distal tip projected over the  right ventricle. Stable bilateral perihilar opacities are noted concerning for edema or pneumonia.  IMPRESSION: Endotracheal and nasogastric tubes in grossly good position. Interval placement of right internal jugular catheter with distal tip projected over expected position of right ventricle. Stable bilateral perihilar opacities are noted.   Electronically Signed   By: Roque Lias M.D.   On: 12/30/2013 17:54   Dg Chest Port 1 View  12/29/2013   CLINICAL DATA:  Central line placement. Nasogastric tube placement. Meningitis.  EXAM: PORTABLE CHEST - 1 VIEW  COMPARISON:  12/29/2013 at 4:50 p.m.  FINDINGS: Endotracheal tube tip 2.1 cm above the carina. Left IJ line tip: SVC. Nasogastric tube extends into the stomach view beyond the inferior margin of today' s radiograph.  Stable bilateral airspace opacities, low lung volumes, and cardiomegaly.  IMPRESSION: 1. The nasogastric tube has been repositioned and now extends down into the stomach and beyond the inferior margin of today's image compatible with satisfactory positioning.   Electronically Signed   By: Herbie Baltimore M.D.   On: 12/29/2013 18:47   Dg Chest Port 1 View  12/29/2013   CLINICAL DATA:  Nasogastric tube placement. ET tube placement. Acute Respiratory failure.  EXAM: PORTABLE ABDOMEN - 1 VIEW; PORTABLE CHEST - 1 VIEW  COMPARISON:  12/29/2013  FINDINGS: Chest x-ray:  The endotracheal tube is an good position, 2.8 cm above the carina. The NG tube is coiled back on itself in the mid esophagus and should be retracted and replaced. Persistent cardiac enlargement and diffuse airspace process in the lungs with bilateral effusions.  Abdomen:  The endotracheal tube is coiled in the mid esophagus and should be removed and replaced. The abdominal bowel gas pattern is unremarkable.  IMPRESSION: The endotracheal tube is in good position.  The NG tube is coiled in the upper esophagus.  Persistent diffuse airspace process.  Normal abdominal radiograph.    Electronically Signed   By: Loralie Champagne M.D.   On: 12/29/2013 17:17   Dg Chest Port 1 View  12/29/2013   CLINICAL DATA:  Intubated, check endotracheal tube position  EXAM: PORTABLE CHEST - 1 VIEW  COMPARISON:  Prior chest x-ray earlier today at 10:48 a.m.  FINDINGS: Interval intubation. The endotracheal tube tip is 1.5 cm above the carina.  Stable cardiomegaly. Diffuse bilateral interstitial and airspace opacities. Developing right pleural effusion appears more evident than on seen on the radiographs  from earlier today.  IMPRESSION: 1. The tip of the endotracheal tube is 1.5 cm above the carina. 2. Persistent diffuse bilateral interstitial and airspace opacities concerning for pulmonary edema. Massive aspiration, multifocal pneumonia or alveolar pulmonary hemorrhage could appear similar in the appropriate clinical scenarios. 3. Developing right pleural effusion.   Electronically Signed   By: Malachy Moan M.D.   On: 12/29/2013 15:37   Dg Chest Port 1 View  12/29/2013   CLINICAL DATA:  Tachypnea, hypoxia, shortness of breath.  EXAM: PORTABLE CHEST - 1 VIEW  COMPARISON:  12/28/2013.  FINDINGS: Trachea is midline. Heart size is grossly stable. There is diffuse bilateral airspace disease, new or progressive from 12/28/2013. No definite pleural fluid.  IMPRESSION: New or progressive diffuse bilateral airspace disease is indicative of pulmonary edema. Difficult to exclude aspiration.   Electronically Signed   By: Leanna Battles M.D.   On: 12/29/2013 12:26   Dg Abd Portable 1v  12/29/2013   CLINICAL DATA:  Nasogastric tube placement  EXAM: PORTABLE ABDOMEN - 1 VIEW  COMPARISON:  Portable exam 1812 hr compared to 12/29/2013  FINDINGS: Nasogastric tube tip projects over distal gastric antrum.  Nonobstructive bowel gas pattern.  Bones unremarkable.  IMPRESSION: Tip of nasogastric tube projects over distal gastric antrum.   Electronically Signed   By: Ulyses Southward M.D.   On: 12/29/2013 18:38   Dg Abd  Portable 1v  12/29/2013   CLINICAL DATA:  Nasogastric tube placement. ET tube placement. Acute Respiratory failure.  EXAM: PORTABLE ABDOMEN - 1 VIEW; PORTABLE CHEST - 1 VIEW  COMPARISON:  12/29/2013  FINDINGS: Chest x-ray:  The endotracheal tube is an good position, 2.8 cm above the carina. The NG tube is coiled back on itself in the mid esophagus and should be retracted and replaced. Persistent cardiac enlargement and diffuse airspace process in the lungs with bilateral effusions.  Abdomen:  The endotracheal tube is coiled in the mid esophagus and should be removed and replaced. The abdominal bowel gas pattern is unremarkable.  IMPRESSION: The endotracheal tube is in good position.  The NG tube is coiled in the upper esophagus.  Persistent diffuse airspace process.  Normal abdominal radiograph.   Electronically Signed   By: Loralie Champagne M.D.   On: 12/29/2013 17:17     Assessment/Plan: Severe infective endocardtitis due to group b strep/strep agalactiae ( PCN S MIC <0.06) c/b bacterial meningitis, brain abscess, heart failure due valvular insufficiency POD #0 for AVR, ascending aortic root replacement with homograft and repair of septal leaflet tricuspid valve, closure of PFO  - group b strep isolate is highly sensitive to PCN. Recommend we keep him on ceftriaxone 2gm IV Q12hr (For CNS penetration) no harm to kidney function - respiratory decompensation is all likely due to severe AI, mod TR. Initially had changed him to vancomycin and cefepime yesterday. Will d/c cefepime. Keep vancomycin for now and reassess culture - will ask path to send tissue for pcr testing - will need 6 wks of ceftriaxone to treat endocarditis. Will use 11/19 as day 1. If kidney injury improve, will consider to do gent x 2 wk - his recurrent fevers from 11/15-11/17 possibly due to embolic phenomenon seeding another site. If patient is still having fevers and is more stable, may need to consider imaging looking for other foci  of infection.  Drue Second [facility] Cell: [phone] Pager: [phone]  12/30/2013, 7:33 PM

## 2013-12-30 NOTE — Progress Notes (Signed)
I received report on this patient this am at 0700. I monitored him until 0800 when OR came to collect the patient for his surgery. Vitals remained stable. Family were at bedside and spoke with the surgeon prior to surgery. They had the opportunity to ask questions. Pt. Was transported to the OR without any problems, tolerating transport well.He arrived to the OR in stable condition.Report was called to 2S to allow for the best possible continuity of care for the patient.

## 2013-12-30 NOTE — Transfer of Care (Signed)
Immediate Anesthesia Transfer of Care Note  Patient: [name]  Procedure(s) Performed: Procedure(s): ASCENDING AORTIC ROOT REPLACEMENT with 52mm HOMOGRAFT, CLOSURE OF AORTIC TO RIGHT ATRIAL FISTULA, CLOSURE OF PATENT FORAMEN OVALE (N/A) REPAIR OF TRICUSPID VALVE LEAFLET (N/A) INTRAOPERATIVE TRANSESOPHAGEAL ECHOCARDIOGRAM (N/A)  Patient Location: PACU and ICU  Anesthesia Type:General  Level of Consciousness: Patient remains intubated per anesthesia plan  Airway & Oxygen Therapy: Patient remains intubated per anesthesia plan and Patient placed on Ventilator (see vital sign flow sheet for setting)  Post-op Assessment: Report given to PACU RN  Post vital signs: Reviewed and stable  Complications: No apparent anesthesia complications

## 2013-12-30 NOTE — Progress Notes (Signed)
Patient ID: [name], male   DOB: January 04, 1986, 28 y.o.   MRN: [id number]   SICU Evening Rounds:   Hemodynamically stable  CI = 4.4 on dopamine  Asleep on vent.  Do not plan to wean tonight Urine output good  CT output low  CBC    Component Value Date/Time   WBC 38.6* 12/30/2013 1650   RBC 4.28 12/30/2013 1650   HGB 12.4* 12/30/2013 1650   HCT 36.7* 12/30/2013 1650   PLT 81* 12/30/2013 1650   MCV 85.7 12/30/2013 1650   MCH 29.0 12/30/2013 1650   MCHC 33.8 12/30/2013 1650   RDW 14.0 12/30/2013 1650   LYMPHSABS 1.8 12/28/2013 1250   MONOABS 1.4* 12/28/2013 1250   EOSABS 0.2 12/28/2013 1250   BASOSABS 0.0 12/28/2013 1250     BMET    Component Value Date/Time   NA 137 12/30/2013 1552   K 3.9 12/30/2013 1552   CL 103 12/30/2013 1552   CO2 18* 12/30/2013 0429   GLUCOSE 97 12/30/2013 1552   BUN 33* 12/30/2013 1552   CREATININE 2.10* 12/30/2013 1552   CALCIUM 8.5 12/30/2013 0429   GFRNONAA 42* 12/30/2013 0429   GFRAA 48* 12/30/2013 0429     A/P:  Stable postop course. Continue current plans

## 2013-12-30 NOTE — OR Nursing (Signed)
45 minute call made to SICU nurse

## 2013-12-30 NOTE — OR Nursing (Signed)
25 min call made to SICU 

## 2013-12-30 NOTE — Anesthesia Procedure Notes (Signed)
Anesthesia Procedure Note PA catheter:  Routine monitors. Timeout, sterile prep, drape, FBP R neck. finder and trocar RIJ 1st pass with US guidance. J-wire pulled out with trocar, so repeat IJ stick and cordis placed over J wire. PA catheter in easily to RA in preparation for tricuspid valve procedure.  Sterile dressing applied.  Patient tolerated well, VSS.  Jenita Seashore, MD (in [place])

## 2013-12-30 NOTE — Progress Notes (Signed)
ANTIBIOTIC CONSULT NOTE - FOLLOW UP  Pharmacy Consult for Vancomycin Indication: Sepsis, Group B Strep disseminated infection  No Known Allergies  Patient Measurements: Height: 5\' 5"  (165.1 cm) Weight: 212 lb 15.4 oz (96.6 kg) IBW/kg (Calculated) : 61.5 Adjusted Body Weight:   Vital Signs: Temp: 95.9 F (35.5 C) (11/18 1730) BP: 112/76 mmHg (11/18 1730) Pulse Rate: 80 (11/18 1730) Intake/Output from previous day: 11/17 0701 - 11/18 0700 In: 1093.9 [I.V.:383.9; NG/GT:110; IV Piggyback:600] Out: 715 [Urine:715] Intake/Output from this shift: Total I/O In: 4300 [I.V.:2800; Blood:1500] Out: 2500 [Urine:500; Blood:2000]  Labs:  Recent Labs  12/29/13 0331  12/30/13 0429  12/30/13 1354 12/30/13 1401 12/30/13 1445 12/30/13 1552 12/30/13 1650  WBC 24.6*  --  22.1*  --   --   --   --   --  38.6*  HGB 10.7*  --  9.2*  < > 7.5* 7.3* 6.8* 9.9* 12.4*  PLT 167  --  234  --   --  110*  --   --  81*  CREATININE 1.47*  < > 2.11*  < > 2.40*  --  2.20* 2.10*  --   < > = values in this interval not displayed. Estimated Creatinine Clearance: 56.9 mL/min (by C-G formula based on Cr of 2.1). No results for input(s): VANCOTROUGH, VANCOPEAK, VANCORANDOM, GENTTROUGH, GENTPEAK, GENTRANDOM, TOBRATROUGH, TOBRAPEAK, TOBRARND, AMIKACINPEAK, AMIKACINTROU, AMIKACIN in the last 72 hours.   Microbiology: Recent Results (from the past 720 hour(s))  CSF culture     Status: None   Collection Time: 12/23/13 12:20 PM  Result Value Ref Range Status   Specimen Description CSF  Final   Special Requests NONE  Final   Gram Stain   Final    CYOSPIN NO WBC SEEN NO ORGANISMS SEEN Performed at [facility]    Culture   Final    FEW GROUP B STREP(S.AGALACTIAE)ISOLATED Note: CRITICAL RESULT CALLED TO, READ BACK BY AND VERIFIED WITH: STEPHANIE SHAW @ 0744 ON [id number] BY [facility] Performed at [facility]    Report Status 12/25/2013 FINAL  Final   Organism ID, Bacteria GROUP B  STREP(S.AGALACTIAE)ISOLATED  Final      Susceptibility   Group b strep(s.agalactiae)isolated - MIC*    ERYTHROMYCIN >=8 RESISTANT Resistant     PENICILLIN <=0.06 SENSITIVE Sensitive     VANCOMYCIN 0.5 SENSITIVE Sensitive     AMPICILLIN <=0.25 SENSITIVE Sensitive     * FEW GROUP B STREP(S.AGALACTIAE)ISOLATED  MRSA PCR Screening     Status: None   Collection Time: 12/23/13  5:27 PM  Result Value Ref Range Status   MRSA by PCR NEGATIVE NEGATIVE Final    Comment:        The GeneXpert MRSA Assay (FDA approved for NASAL specimens only), is one component of a comprehensive MRSA colonization surveillance program. It is not intended to diagnose MRSA infection nor to guide or monitor treatment for MRSA infections.   Culture, blood (routine x 2)     Status: None   Collection Time: 12/24/13 10:50 AM  Result Value Ref Range Status   Specimen Description BLOOD RIGHT HAND  Final   Special Requests   Final    BOTTLES DRAWN AEROBIC AND ANAEROBIC 6CC AER 5CC ANA   Culture  Setup Time   Final    12/24/2013 18:48 Performed at [facility]    Culture   Final    NO GROWTH 5 DAYS Performed at [facility]    Report Status 12/30/2013 FINAL  Final  Culture, blood (routine x 2)     Status: None   Collection Time: 12/24/13 10:58 AM  Result Value Ref Range Status   Specimen Description BLOOD LEFT ANTECUBITAL  Final   Special Requests BOTTLES DRAWN AEROBIC AND ANAEROBIC 10CC  Final   Culture  Setup Time   Final    12/24/2013 18:48 Performed at [facility]    Culture   Final    NO GROWTH 5 DAYS Performed at [facility]    Report Status 12/30/2013 FINAL  Final  Culture, blood (routine x 2)     Status: None (Preliminary result)   Collection Time: 12/28/13 11:50 AM  Result Value Ref Range Status   Specimen Description BLOOD LEFT ANTECUBITAL  Final   Special Requests BOTTLES DRAWN AEROBIC AND ANAEROBIC 10CC  Final   Culture  Setup Time   Final    12/28/2013  16:46 Performed at [facility]    Culture   Final           BLOOD CULTURE RECEIVED NO GROWTH TO DATE CULTURE WILL BE HELD FOR 5 DAYS BEFORE ISSUING A FINAL NEGATIVE REPORT Performed at [facility]    Report Status PENDING  Incomplete  Culture, blood (routine x 2)     Status: None (Preliminary result)   Collection Time: 12/28/13 12:05 PM  Result Value Ref Range Status   Specimen Description BLOOD LEFT HAND  Final   Special Requests BOTTLES DRAWN AEROBIC ONLY 10CC  Final   Culture  Setup Time   Final    12/28/2013 16:46 Performed at [facility]    Culture   Final           BLOOD CULTURE RECEIVED NO GROWTH TO DATE CULTURE WILL BE HELD FOR 5 DAYS BEFORE ISSUING A FINAL NEGATIVE REPORT Performed at [facility]    Report Status PENDING  Incomplete  Culture, Urine     Status: None   Collection Time: 12/29/13  3:32 PM  Result Value Ref Range Status   Specimen Description URINE, CATHETERIZED  Final   Special Requests NONE  Final   Culture  Setup Time   Final    12/29/2013 21:29 Performed at [facility]    Colony Count NO GROWTH Performed at [facility]   Final   Culture NO GROWTH Performed at [facility]   Final   Report Status 12/30/2013 FINAL  Final  Gram stain     Status: None   Collection Time: 12/30/13 11:18 AM  Result Value Ref Range Status   Specimen Description TISSUE VALVE  Final   Special Requests A) AORTIC VALVE W/ VEGETATION  Final   Gram Stain   Final    ABUNDANT WBC PRESENT,BOTH PMN AND MONONUCLEAR ABUNDANT GRAM POSITIVE COCCI IN PAIRS IN CHAINS    Report Status 12/30/2013 FINAL  Final  Gram stain     Status: None   Collection Time: 12/30/13 11:20 AM  Result Value Ref Range Status   Specimen Description TISSUE OTHER  Final   Special Requests B) RIGHT ATRIUM W/ VEGETATION  Final   Gram Stain   Final    MODERATE WBC PRESENT,BOTH PMN AND MONONUCLEAR RARE GRAM POSITIVE COCCI IN PAIRS    Report  Status 12/30/2013 FINAL  Final    Anti-infectives    Start     Dose/Rate Route Frequency Ordered Stop   12/30/13 0800  vancomycin (VANCOCIN) 1,500 mg in sodium chloride 0.9 % 500  mL IVPB  Status:  Discontinued     1,500 mg250 mL/hr over 120 Minutes Intravenous Every 12 hours 12/29/13 1948 12/30/13 0745   12/30/13 0800  vancomycin (VANCOCIN) IVPB 1000 mg/200 mL premix     1,000 mg200 mL/hr over 60 Minutes Intravenous Every 12 hours 12/30/13 0745     12/30/13 0200  vancomycin (VANCOCIN) 1,500 mg in sodium chloride 0.9 % 500 mL IVPB  Status:  Discontinued     1,500 mg250 mL/hr over 120 Minutes Intravenous Every 12 hours 12/29/13 1326 12/29/13 1948   12/29/13 2030  vancomycin (VANCOCIN) 1,250 mg in sodium chloride 0.9 % 250 mL IVPB     1,250 mg166.7 mL/hr over 90 Minutes Intravenous To Surgery 12/29/13 2025 12/30/13 0930   12/29/13 2030  cefUROXime (ZINACEF) 1.5 g in dextrose 5 % 50 mL IVPB     1.5 g100 mL/hr over 30 Minutes Intravenous To Surgery 12/29/13 2025 12/30/13 1523   12/29/13 2030  cefUROXime (ZINACEF) 750 mg in dextrose 5 % 50 mL IVPB  Status:  Discontinued     750 mg100 mL/hr over 30 Minutes Intravenous To Surgery 12/29/13 2025 12/30/13 1638   12/29/13 2000  vancomycin (VANCOCIN) 2,000 mg in sodium chloride 0.9 % 500 mL IVPB     2,000 mg250 mL/hr over 120 Minutes Intravenous  Once 12/29/13 1948 12/29/13 2223   12/29/13 1230  ceFEPIme (MAXIPIME) 2 g in dextrose 5 % 50 mL IVPB     2 g100 mL/hr over 30 Minutes Intravenous Every 12 hours 12/29/13 1138     12/29/13 1230  vancomycin (VANCOCIN) 2,000 mg in sodium chloride 0.9 % 500 mL IVPB  Status:  Discontinued     2,000 mg250 mL/hr over 120 Minutes Intravenous  Once 12/29/13 1138 12/29/13 2023   12/24/13 0600  vancomycin (VANCOCIN) 1,250 mg in sodium chloride 0.9 % 250 mL IVPB  Status:  Discontinued     1,250 mg166.7 mL/hr over 90 Minutes Intravenous Every 12 hours 12/23/13 1751 12/24/13 0918   12/24/13 0200  cefTRIAXone (ROCEPHIN) 2 g in  dextrose 5 % 50 mL IVPB - Premix  Status:  Discontinued     2 g100 mL/hr over 30 Minutes Intravenous Every 12 hours 12/23/13 1751 12/29/13 1132   12/23/13 1800  vancomycin (VANCOCIN) 1,500 mg in sodium chloride 0.9 % 500 mL IVPB     1,500 mg250 mL/hr over 120 Minutes Intravenous STAT 12/23/13 1749 12/23/13 2326   12/23/13 1800  acyclovir (ZOVIRAX) 615 mg in dextrose 5 % 100 mL IVPB  Status:  Discontinued     10 mg/kg  61.5 kg (Ideal)112.3 mL/hr over 60 Minutes Intravenous 3 times per day 12/23/13 1750 12/24/13 0857   12/23/13 1400  cefTRIAXone (ROCEPHIN) 2 g in dextrose 5 % 50 mL IVPB     2 g100 mL/hr over 30 Minutes Intravenous  Once 12/23/13 1347 12/23/13 1428   12/23/13 1349  cefTRIAXone (ROCEPHIN) 2 G injection    Comments:  Tommi Rumps   : cabinet override      12/23/13 1349 12/24/13 0159      Assessment: 28yom s/p OHS to continue Vancomycin and Cefepime for Group B Strep meningitis, bacteremia, brain abscess, and endocarditis. SCr has continued to trend up (~2.1, CrCl ~57 ml/min) - dose has been reduced to 1g IV q12h (patient received 1.25g as pre-op this AM). Will continue to monitor cultures, clinical course and renal function - will plan to reduce dose or check Vancomycin level if SCr continues negative trend.  -  Wt 97kg - UOP ~1 ml/kg/hr  Goal of Therapy:  Vancomycin trough level 15-20 mcg/ml  Plan:  1. Continue Vancomycin 1g IV q12h 2. Continue Cefepime 2g IV q12h 3. Monitor renal function, cultures, clinical course  Cleon Dew  [phone] 12/30/2013,5:49 PM

## 2013-12-30 NOTE — Progress Notes (Signed)
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 We will follow postoperatively.

## 2013-12-30 NOTE — Progress Notes (Signed)
PULMONARY / CRITICAL CARE MEDICINE   Name: Lyndol Vanderheiden MRN: [id number] DOB: [birth year]    ADMISSION DATE:  12/23/2013 CONSULTATION DATE:  12/29/13  REFERRING MD :  Dr Sharon Seller, [facility]  Reason for consultation:  Respiratory distress, B pulmonary infiltrates  INITIAL PRESENTATION:  28 yo man, admitted 11/11 with meningitis c/b transaminitis and DIC.  LP showed Group B strep and MRI brain showed embolic process. 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 Developed progressive dyspnea and respiratory distress 11/17 with evolving (new) B infiltrates on CXR.   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 TEE showing endocarditis. CT surgery consulted for surgery.  SUBJECTIVE:  11/18 no events overnight, to surgery this morning  VITAL SIGNS: Temp:  [99.6 F (37.6 C)-102.7 F (39.3 C)] 99.9 F (37.7 C) (11/18 0700) Pulse Rate:  [81-127] 81 (11/18 0700) Resp:  [0-45] 25 (11/18 0700) BP: (58-139)/(21-46) 90/30 mmHg (11/18 0700) SpO2:  [96 %-100 %] 100 % (11/18 0700) Arterial Line BP: (105-210)/(22-34) 115/25 mmHg (11/18 0700) FiO2 (%):  [40 %-100 %] 40 % (11/18 0356) HEMODYNAMICS: CVP:  [15 mmHg] 15 mmHg VENTILATOR SETTINGS: Vent Mode:  [-] PRVC FiO2 (%):  [40 %-100 %] 40 % Set Rate:  [24 bmp] 24 bmp Vt Set:  [500 mL] 500 mL PEEP:  [5 cmH20-8 cmH20] 5 cmH20 Plateau Pressure:  [21 cmH20-25 cmH20] 25 cmH20 INTAKE / OUTPUT:  Intake/Output Summary (Last 24 hours) at 12/30/13 0742 Last data filed at 12/30/13  0601  Gross per 24 hour  Intake 1093.91 ml  Output    715 ml  Net 378.91 ml    PHYSICAL EXAMINATION: General:   Neuro:  RASS -3 HEENT: PERRL Cardiovascular:  Regular, normal s1s2, 3/6 systolic murmur appreciated. 2+ DP pulses bilat Lungs:  Bibasilar crackles anteriorly Abdomen:  Soft, NT, + BS Musculoskeletal:  No deformities, no edema Skin:  No rashes  LABS:  CBC  Recent Labs Lab 12/28/13 0320 12/29/13 0331 12/30/13 0429  WBC 32.3* 24.6* 22.1*  HGB 12.0* 10.7* 9.2*  HCT 35.6* 31.2* 27.2*  PLT 101* 167 234   Coag's  Recent Labs Lab 12/23/13 1815 12/26/13 0346 12/29/13 2200  APTT 30  --  36  INR 1.22 1.28 1.84*   BMET  Recent Labs Lab 12/29/13 0331 12/29/13 2200 12/30/13 0429  NA 133* 133* 132*  K 3.5* 4.8 4.7  CL 98 103 102  CO2 17* 17* 18*  BUN 17 25* 33*  CREATININE 1.47* 1.87* 2.11*  GLUCOSE 171* 102* 115*   Electrolytes  Recent Labs Lab 12/25/13 0315  12/27/13 0436  12/29/13 0331 12/29/13 2200 12/30/13 0429  CALCIUM 7.8*  < > 7.6*  < > 7.3* 7.3* 8.5  MG 3.2*  --   --   --   --   --   --   PHOS  --   --  4.2  --   --   --   --   < > = values in this interval not displayed. Sepsis Markers No results for input(s): LATICACIDVEN, PROCALCITON, O2SATVEN  in the last 168 hours. ABG  Recent Labs Lab 12/29/13 0547 12/29/13 1538  PHART 7.539* 7.302*  PCO2ART 22.6* 43.6  PO2ART 57.4* 196.0*   Liver Enzymes  Recent Labs Lab 12/28/13 0320 12/29/13 0331 12/29/13 2200  AST 25 60* 293*  ALT 34 60* 262*  ALKPHOS 97 75 63  BILITOT 1.2 0.8 1.1  ALBUMIN 1.9* 1.9* 1.8*   Cardiac Enzymes  Recent Labs Lab 12/28/13 0320 12/28/13 0726 12/28/13 1250  TROPONINI 0.37* 0.49* 0.58*   Glucose  Recent Labs Lab 12/29/13 1534 12/29/13 1940  GLUCAP 191* 91    Imaging Dg Chest Port 1 View  12/29/2013   CLINICAL DATA:  Central line placement. Nasogastric tube placement. Meningitis.  EXAM: PORTABLE CHEST - 1 VIEW  COMPARISON:  12/29/2013  at 4:50 p.m.  FINDINGS: Endotracheal tube tip 2.1 cm above the carina. Left IJ line tip: SVC. Nasogastric tube extends into the stomach view beyond the inferior margin of today' s radiograph.  Stable bilateral airspace opacities, low lung volumes, and cardiomegaly.  IMPRESSION: 1. The nasogastric tube has been repositioned and now extends down into the stomach and beyond the inferior margin of today's image compatible with satisfactory positioning.   Electronically Signed   By: Herbie Baltimore M.D.   On: 12/29/2013 18:47   Dg Chest Port 1 View  12/29/2013   CLINICAL DATA:  Nasogastric tube placement. ET tube placement. Acute Respiratory failure.  EXAM: PORTABLE ABDOMEN - 1 VIEW; PORTABLE CHEST - 1 VIEW  COMPARISON:  12/29/2013  FINDINGS: Chest x-ray:  The endotracheal tube is an good position, 2.8 cm above the carina. The NG tube is coiled back on itself in the mid esophagus and should be retracted and replaced. Persistent cardiac enlargement and diffuse airspace process in the lungs with bilateral effusions.  Abdomen:  The endotracheal tube is coiled in the mid esophagus and should be removed and replaced. The abdominal bowel gas pattern is unremarkable.  IMPRESSION: The endotracheal tube is in good position.  The NG tube is coiled in the upper esophagus.  Persistent diffuse airspace process.  Normal abdominal radiograph.   Electronically Signed   By: Loralie Champagne M.D.   On: 12/29/2013 17:17   Dg Chest Port 1 View  12/29/2013   CLINICAL DATA:  Intubated, check endotracheal tube position  EXAM: PORTABLE CHEST - 1 VIEW  COMPARISON:  Prior chest x-ray earlier today at 10:48 a.m.  FINDINGS: Interval intubation. The endotracheal tube tip is 1.5 cm above the carina.  Stable cardiomegaly. Diffuse bilateral interstitial and airspace opacities. Developing right pleural effusion appears more evident than on seen on the radiographs from earlier today.  IMPRESSION: 1. The tip of the endotracheal tube is 1.5 cm  above the carina. 2. Persistent diffuse bilateral interstitial and airspace opacities concerning for pulmonary edema. Massive aspiration, multifocal pneumonia or alveolar pulmonary hemorrhage could appear similar in the appropriate clinical scenarios. 3. Developing right pleural effusion.   Electronically Signed   By: Malachy Moan M.D.   On: 12/29/2013 15:37   Dg Chest Port 1 View  12/29/2013   CLINICAL DATA:  Tachypnea, hypoxia, shortness of breath.  EXAM: PORTABLE CHEST - 1 VIEW  COMPARISON:  12/28/2013.  FINDINGS: Trachea is midline. Heart size is grossly stable. There is diffuse bilateral airspace disease, new or progressive from 12/28/2013. No definite pleural fluid.  IMPRESSION: New or progressive diffuse bilateral airspace disease is indicative of pulmonary edema. Difficult to exclude aspiration.   Electronically Signed   By: Juliette Alcide  Blietz M.D.   On: 12/29/2013 12:26   Dg Abd Portable 1v  12/29/2013   CLINICAL DATA:  Nasogastric tube placement  EXAM: PORTABLE ABDOMEN - 1 VIEW  COMPARISON:  Portable exam 1812 hr compared to 12/29/2013  FINDINGS: Nasogastric tube tip projects over distal gastric antrum.  Nonobstructive bowel gas pattern.  Bones unremarkable.  IMPRESSION: Tip of nasogastric tube projects over distal gastric antrum.   Electronically Signed   By: Ulyses SouthwardMark  Boles M.D.   On: 12/29/2013 18:38   Dg Abd Portable 1v  12/29/2013   CLINICAL DATA:  Nasogastric tube placement. ET tube placement. Acute Respiratory failure.  EXAM: PORTABLE ABDOMEN - 1 VIEW; PORTABLE CHEST - 1 VIEW  COMPARISON:  12/29/2013  FINDINGS: Chest x-ray:  The endotracheal tube is an good position, 2.8 cm above the carina. The NG tube is coiled back on itself in the mid esophagus and should be retracted and replaced. Persistent cardiac enlargement and diffuse airspace process in the lungs with bilateral effusions.  Abdomen:  The endotracheal tube is coiled in the mid esophagus and should be removed and replaced. The  abdominal bowel gas pattern is unremarkable.  IMPRESSION: The endotracheal tube is in good position.  The NG tube is coiled in the upper esophagus.  Persistent diffuse airspace process.  Normal abdominal radiograph.   Electronically Signed   By: Loralie ChampagneMark  Gallerani M.D.   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 UOP 750cc past 24hrs  P:   Follow S Cr and electrolytes  GASTROINTESTINAL A:   No issues P:   No current GI acid prophylaxis > on a diet  HEMATOLOGIC A:   Thrombocytopenia due to DIC, improved/resolved P:  Follow CBC  INFECTIOUS A:   Group B strep endocarditis, with septic brain abscesses and meningitis Possible embolic pneumonias versus HCAP P:   Blood 11/12 >> Negative Blood 11/16 >>  CSF 11/11 >> Group B strep (R to erythromycin)    Ceftriaxone 2 g 11/11 >> 11/17 Cefepime 11/17 >>  Vanco 11/17 >>   ID following   ENDOCRINE A:   No acute issues   P:    NEUROLOGIC A:   Mild lethargy, likely related to brain lesions and overall illness.  P:   RASS goal: -3 Propofol gtt Fentanyl/versed/haldol prn   FAMILY  - Updates:  Reviewed plans 11/18 with mother, fiance, family friends at bedside  - Inter-disciplinary family meet or Palliative Care meeting due by: 11/24   TODAY'S SUMMARY: acute respiratory failure in setting of bilateral infiltrates, etiology unclear but consider pneumonia versus acute  lung injury versus cardiogenic edema. Moving to ICU, may require mechanical ventilation. Consider chest CT to better evaluate his infiltrates and to rule out PE (doubt)  Tawni CarnesAndrew Torie Priebe, MD 12/30/2013, 8:04 AM PGY-2, [facility]

## 2013-12-30 NOTE — Brief Op Note (Addendum)
      [address]       [name] [address]             [phone]    12/30/2013  2:45 PM  PATIENT:  [name]  28 y.o. male  PRE-OPERATIVE DIAGNOSIS:  1.ENDOCARDITIS  (AORTIC and TRICUSPID VALVE VEGETATIONS) 2. SEVERE AI 3. 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 GROUP B STREP MENINGITIS  POST-OPERATIVE DIAGNOSIS:1.ENDOCARDITIS  AORTIC 2. SEVERE AI 3. 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 GROUP B STREP MENINGITIS 6.PATENT FORAMEN OVALE  PROCEDURE:  INTRAOPERATIVE TRANSESOPHAGEAL ECHOCARDIOGRAM, ASCENDING AORTIC ROOT REPLACEMENT (using a Homograft, size 23 mm),  Repair of Septal leaflet tricuspid valve  CLOSURE of PATENT FORAMEN OVALE  FINDINGS: Approximate 3 cm fistula between the LVOT and the RA, biscupid AV, and vegetation on AV not TV  SURGEON:  Surgeon(s) and Role:    * Delight Ovens, MD - Primary  PHYSICIAN ASSISTANT: Doree Fudge PA-C  ASSISTANTS: Minda Ditto RNFA  ANESTHESIA:   general  EBL:  Total I/O In: -  Out: 200 [Urine:200]   DRAINS: Chest tube placed in the mediastinal and pleural spaces    SPECIMEN:  Source of Specimen:  Vegetation from aortic valve, native aortic valve leaflets  DISPOSITION OF SPECIMEN:  Culture and pathology  COUNTS CORRECT:  YES  DICTATION: .Dragon Dictation  PLAN OF CARE: Admit to inpatient   PATIENT DISPOSITION:  ICU - intubated and hemodynamically stable.   Delay start of Pharmacological VTE agent (>24hrs) due to surgical blood loss or risk of bleeding: yes  BASELINE WEIGHT: 96.6 kg  Aortic Valve Etiology   Aortic Insufficiency:  Severe  Aortic Valve Disease:  Yes.  Aortic Stenosis:  No.  Etiology (Choose at least one and up to  5 etiologies):  Bicuspid valve disease, Endocarditis with root abscess and LV outflow tract pathology, Other  Aortic Valve  Procedure Performed:  Replacement: Yes.  Homograft. Implant Model Number:HVA-L Cryo Aortic Valve, Size:23 mm, Unique Device  Identifier:[id number]   Repair/Reconstruction: Yes.  Homograft root replacement Homograft. Implant Model Number:HVA-L Cryo Aortic Valve, Size:23 mm, Unique Device Identifier:[id number]   Aortic Annular Enlargement: No.

## 2013-12-30 NOTE — Anesthesia Preprocedure Evaluation (Addendum)
Anesthesia Evaluation  Patient identified by MRN, date of birth, ID band Patient unresponsive  General Assessment Comment:Patient intubated and sedated.  Hx. Confirmed with his mother.  Reviewed: Allergy & Precautions, H&P , NPO status , Patient's Chart, lab work & pertinent test results, Unable to perform ROS - Chart review only  History of Anesthesia Complications Negative for: history of anesthetic complications  Airway Mallampati: Intubated       Dental  (+) Teeth Intact   Pulmonary  Intubated with VDRF breath sounds clear to auscultation- rhonchi        Cardiovascular +CHF + Valvular Problems/Murmurs (Aortic and tricuspid vegetations) AI Rhythm:Regular Rate:Tachycardia  TEE : normal LV function, large aortic vegetation with severe AI, large tricuspid valve vegetation with at least moderate TR, and a fistula from the aorta and right atrium.    Neuro/Psych  Headaches, Strep meningitis    GI/Hepatic negative GI ROS, Elevated LFTs   Endo/Other  Morbid obesity  Renal/GU Renal Insufficiency and ARFRenal disease (creat 2.11)     Musculoskeletal   Abdominal (+) + obese,   Peds  Hematology  (+) Blood dyscrasia (Hb 9.2, INR 1.84), ,   Anesthesia Other Findings   Reproductive/Obstetrics                           Anesthesia Physical Anesthesia Plan  ASA: IV  Anesthesia Plan: General   Post-op Pain Management:    Induction: Intravenous  Airway Management Planned: Oral ETT  Additional Equipment: Arterial line, CVP, PA Cath, 3D TEE and Ultrasound Guidance Line Placement  Intra-op Plan: Utilization of Controlled Hypotension per surrgeon request  Post-operative Plan: Post-operative intubation/ventilation  Informed Consent: I have reviewed the patients History and Physical, chart, labs and discussed the procedure including the risks, benefits and alternatives for the proposed anesthesia with  the patient or authorized representative who has indicated his/her understanding and acceptance.     Plan Discussed with: CRNA, Anesthesiologist and Surgeon  Anesthesia Plan Comments: (Plan routine monitors, A line, central line, GETA with TEE and post op ventilation)       Anesthesia Quick Evaluation

## 2013-12-31 ENCOUNTER — Encounter (HOSPITAL_COMMUNITY): Payer: Self-pay | Admitting: *Deleted

## 2013-12-31 ENCOUNTER — Inpatient Hospital Stay (HOSPITAL_COMMUNITY): Payer: BC Managed Care – PPO

## 2013-12-31 DIAGNOSIS — I442 Atrioventricular block, complete: Secondary | ICD-10-CM

## 2013-12-31 DIAGNOSIS — I339 Acute and subacute endocarditis, unspecified: Secondary | ICD-10-CM

## 2013-12-31 DIAGNOSIS — I351 Nonrheumatic aortic (valve) insufficiency: Secondary | ICD-10-CM

## 2013-12-31 DIAGNOSIS — I358 Other nonrheumatic aortic valve disorders: Secondary | ICD-10-CM | POA: Diagnosis present

## 2013-12-31 HISTORY — DX: Atrioventricular block, complete: I44.2

## 2013-12-31 LAB — POCT I-STAT, CHEM 8
BUN: 26 mg/dL — AB (ref 6–23)
Calcium, Ion: 1.16 mmol/L (ref 1.12–1.23)
Chloride: 103 mEq/L (ref 96–112)
Creatinine, Ser: 1.4 mg/dL — ABNORMAL HIGH (ref 0.50–1.35)
Glucose, Bld: 137 mg/dL — ABNORMAL HIGH (ref 70–99)
HCT: 31 % — ABNORMAL LOW (ref 39.0–52.0)
Hemoglobin: 10.5 g/dL — ABNORMAL LOW (ref 13.0–17.0)
Potassium: 4 mEq/L (ref 3.7–5.3)
SODIUM: 138 meq/L (ref 137–147)
TCO2: 22 mmol/L (ref 0–100)

## 2013-12-31 LAB — CBC
HCT: 37.3 % — ABNORMAL LOW (ref 39.0–52.0)
HCT: 31.4 % — ABNORMAL LOW (ref 39.0–52.0)
HEMOGLOBIN: 12.5 g/dL — AB (ref 13.0–17.0)
HEMOGLOBIN: 10.5 g/dL — AB (ref 13.0–17.0)
MCH: 28.7 pg (ref 26.0–34.0)
MCH: 28.8 pg (ref 26.0–34.0)
MCHC: 33.5 g/dL (ref 30.0–36.0)
MCHC: 33.4 g/dL (ref 30.0–36.0)
MCV: 85.7 fL (ref 78.0–100.0)
MCV: 86 fL (ref 78.0–100.0)
PLATELETS: 140 10*3/uL — AB (ref 150–400)
Platelets: 149 10*3/uL — ABNORMAL LOW (ref 150–400)
RBC: 4.35 MIL/uL (ref 4.22–5.81)
RBC: 3.65 MIL/uL — AB (ref 4.22–5.81)
RDW: 14 % (ref 11.5–15.5)
RDW: 14.2 % (ref 11.5–15.5)
WBC: 28.8 10*3/uL — ABNORMAL HIGH (ref 4.0–10.5)
WBC: 21.3 10*3/uL — ABNORMAL HIGH (ref 4.0–10.5)

## 2013-12-31 LAB — GLUCOSE, CAPILLARY
GLUCOSE-CAPILLARY: 80 mg/dL (ref 70–99)
GLUCOSE-CAPILLARY: 82 mg/dL (ref 70–99)
GLUCOSE-CAPILLARY: 117 mg/dL — AB (ref 70–99)
GLUCOSE-CAPILLARY: 105 mg/dL — AB (ref 70–99)
Glucose-Capillary: 100 mg/dL — ABNORMAL HIGH (ref 70–99)
Glucose-Capillary: 109 mg/dL — ABNORMAL HIGH (ref 70–99)
Glucose-Capillary: 114 mg/dL — ABNORMAL HIGH (ref 70–99)
Glucose-Capillary: 118 mg/dL — ABNORMAL HIGH (ref 70–99)
Glucose-Capillary: 118 mg/dL — ABNORMAL HIGH (ref 70–99)
Glucose-Capillary: 123 mg/dL — ABNORMAL HIGH (ref 70–99)
Glucose-Capillary: 132 mg/dL — ABNORMAL HIGH (ref 70–99)
Glucose-Capillary: 138 mg/dL — ABNORMAL HIGH (ref 70–99)
Glucose-Capillary: 94 mg/dL (ref 70–99)
Glucose-Capillary: 99 mg/dL (ref 70–99)

## 2013-12-31 LAB — BASIC METABOLIC PANEL
Anion gap: 13 (ref 5–15)
BUN: 32 mg/dL — ABNORMAL HIGH (ref 6–23)
CALCIUM: 7.3 mg/dL — AB (ref 8.4–10.5)
CO2: 19 mEq/L (ref 19–32)
CREATININE: 1.57 mg/dL — AB (ref 0.50–1.35)
Chloride: 107 mEq/L (ref 96–112)
GFR calc Af Amer: 69 mL/min — ABNORMAL LOW (ref >90)
GFR calc non Af Amer: 59 mL/min — ABNORMAL LOW (ref >90)
GLUCOSE: 103 mg/dL — AB (ref 70–99)
Potassium: 5 mEq/L (ref 3.7–5.3)
Sodium: 139 mEq/L (ref 137–147)

## 2013-12-31 LAB — POCT I-STAT 3, ART BLOOD GAS (G3+)
ACID-BASE EXCESS: 2 mmol/L (ref 0.0–2.0)
Acid-base deficit: 2 mmol/L (ref 0.0–2.0)
BICARBONATE: 22.7 meq/L (ref 20.0–24.0)
BICARBONATE: 25.8 meq/L — AB (ref 20.0–24.0)
O2 SAT: 93 %
O2 SAT: 96 %
PCO2 ART: 42.1 mmHg (ref 35.0–45.0)
PO2 ART: 77 mmHg — AB (ref 80.0–100.0)
Patient temperature: 38.4
TCO2: 24 mmol/L (ref 0–100)
TCO2: 27 mmol/L (ref 0–100)
pCO2 arterial: 40 mmHg (ref 35.0–45.0)
pH, Arterial: 7.347 — ABNORMAL LOW (ref 7.350–7.450)
pH, Arterial: 7.423 (ref 7.350–7.450)
pO2, Arterial: 84 mmHg (ref 80.0–100.0)

## 2013-12-31 LAB — CREATININE, SERUM
CREATININE: 1.23 mg/dL (ref 0.50–1.35)
GFR calc Af Amer: >90 mL/min (ref >90)
GFR calc non Af Amer: 80 mL/min — ABNORMAL LOW (ref >90)

## 2013-12-31 LAB — MAGNESIUM
MAGNESIUM: 2.1 mg/dL (ref 1.5–2.5)
Magnesium: 2.2 mg/dL (ref 1.5–2.5)

## 2013-12-31 MED ORDER — PANTOPRAZOLE SODIUM 40 MG IV SOLR
40.0000 mg | INTRAVENOUS | Status: DC
  Administered 2013-12-31 – 2014-01-04 (×5): 40 mg via INTRAVENOUS
  Filled 2013-12-31 (×6): qty 40

## 2013-12-31 MED ORDER — FUROSEMIDE 10 MG/ML IJ SOLN
40.0000 mg | Freq: Once | INTRAMUSCULAR | Status: AC
  Administered 2013-12-31: 40 mg via INTRAVENOUS
  Filled 2013-12-31: qty 4

## 2013-12-31 MED ORDER — INSULIN ASPART 100 UNIT/ML ~~LOC~~ SOLN
0.0000 [IU] | SUBCUTANEOUS | Status: DC
  Administered 2013-12-31 – 2014-01-03 (×7): 2 [IU] via SUBCUTANEOUS

## 2013-12-31 MED ORDER — SODIUM CHLORIDE 0.9 % IV SOLN: INTRAVENOUS | Status: DC | PRN

## 2013-12-31 MED FILL — Potassium Chloride Inj 2 mEq/ML: INTRAVENOUS | Qty: 40 | Status: AC

## 2013-12-31 MED FILL — Heparin Sodium (Porcine) Inj 1000 Unit/ML: INTRAMUSCULAR | Qty: 30 | Status: AC

## 2013-12-31 MED FILL — Magnesium Sulfate Inj 50%: INTRAMUSCULAR | Qty: 10 | Status: AC

## 2013-12-31 NOTE — Progress Notes (Signed)
[facility]    Date of Admission:  12/23/2013   Total days of antibiotics 9        Day 9 ceftriaxone        Day 3 vanco           ID: [name] is a 28 y.o. male with  Group b strep sepsis 2/2 native AV endocarditis c/b meningitis and brain abscesses POD#1 S/P aortic root replacement, AVR, TV repair, and repair of Aorto-R Atrial fistula, and found to have  3rd degree AV block 2/2  infection and surgical repair. Active Problems:   Thrombocytopenia   Meningitis due to bacterium   Acute renal failure syndrome   Dehydration   Sepsis due to group B Streptococcus   LFT elevation   Meningitis, streptococcal   Headache   Pain in joint, lower leg   Brain abscess   Acute respiratory failure with hypoxia   Acute endocarditis   Acute on chronic respiratory failure with hypoxia   HCAP (healthcare-associated pneumonia)   Acute systolic congestive heart failure   Meningitis   Bacterial endocarditis   Bilateral edema of lower extremity   Severe aortic insufficiency   Acute respiratory failure   Third degree heart block   Endocarditis of aortic valve    Subjective: Remains intubated, sedated to minimize agitation. Slow wean from vasopressors. Off of dopamine. tmax of 101.8 in early morning, receiving tylenol  Medications:  . acetaminophen  1,000 mg Oral 4 times per day   Or  . acetaminophen (TYLENOL) oral liquid 160 mg/5 mL  1,000 mg Per Tube 4 times per day  . antiseptic oral rinse  7 mL Mouth Rinse QID  . bisacodyl  10 mg Oral Daily   Or  . bisacodyl  10 mg Rectal Daily  . cefTRIAXone (ROCEPHIN)  IV  2 g Intravenous Q12H  . chlorhexidine  15 mL Mouth Rinse BID  . docusate sodium  200 mg Oral Daily  . insulin aspart  0-24 Units Subcutaneous 6 times per day  . pantoprazole (PROTONIX) IV  40 mg Intravenous Q24H  . sodium chloride  3 mL Intravenous Q12H    Objective: Vital signs in last 24 hours: Temp:  [95.9 F (35.5 C)-101.3 F (38.5 C)] 99.9  F (37.7 C) (11/19 1500) Pulse Rate:  [78-91] 80 (11/19 1500) Resp:  [12-24] 14 (11/19 1500) BP: (83-131)/(39-76) 118/63 mmHg (11/19 1546) SpO2:  [93 %-97 %] 97 % (11/19 1546) Arterial Line BP: (85-129)/(51-80) 104/54 mmHg (11/19 1500) FiO2 (%):  [40 %-50 %] 40 % (11/19 1600) Weight:  [222 lb (100.699 kg)] 222 lb (100.699 kg) (11/19 0530) Physical Exam  Constitutional: He is intubated, sedateHe appears well-developed and well-nourished. No distress.  HENT:  Mouth/Throat: ETT in place pulm = Chest bilateral rhonchi Cv: regular,3 /6 SEM heard throughout Abd= decrease bowel sounds, soft Ext: No edema Skin = no rash   Lab Results  Recent Labs  12/30/13 0429  12/30/13 2222 12/30/13 2223 12/31/13 0400  WBC 22.1*  < >  --  31.3* 28.8*  HGB 9.2*  < > 12.6* 12.6* 12.5*  HCT 27.2*  < > 37.0* 36.6* 37.3*  NA 132*  < > 137  --  139  K 4.7  < > 5.2  --  5.0  CL 102  < > 104  --  107  CO2 18*  --   --   --  19  BUN 33*  < > 33*  --  32*  CREATININE 2.11*  < > 1.90* 1.78* 1.57*  < > = values in this interval not displayed. Liver Panel  Recent Labs  12/29/13 0331 12/29/13 2200  PROT 5.1* 4.9*  ALBUMIN 1.9* 1.8*  AST 60* 293*  ALT 60* 262*  ALKPHOS 75 63  BILITOT 0.8 1.1    Microbiology: 11/18 tissue cx showing gpc in prs on gram stain  Studies/Results: Dg Chest Port 1 View  12/31/2013   CLINICAL DATA:  28 year old male with endocarditis and CNS infection. Initial encounter.  EXAM: PORTABLE CHEST - 1 VIEW  COMPARISON:  12/30/2013 and earlier.  FINDINGS: Portable AP semi upright view at at 0559 hrs. Stable endotracheal tube tip just below the clavicles. Enteric tube courses to the abdomen as before. Right IJ approach catheter terminates in the RV apex region as before. Midline mediastinal or chest tube is stable. Stable left IJ central line.  Continued low lung volumes with patchy and confluent bilateral pulmonary opacity superimposed on dense lower lobe collapse or  consolidation. No pneumothorax. No large pleural effusion.  IMPRESSION: 1.  Stable lines and tubes. 2. Stable ventilation with widespread pulmonary opacity and dense lower lobe collapse or consolidation.   Electronically Signed   By: Augusto GambleLee  Hall M.D.   On: 12/31/2013 07:49   Dg Chest Port 1 View  12/30/2013   CLINICAL DATA:  Endocarditis of aortic valve.  EXAM: PORTABLE CHEST - 1 VIEW  COMPARISON:  December 29, 2013.  FINDINGS: Endotracheal tube is in grossly good position with distal tip 2 cm above the carina. Nasogastric tube is seen entering the stomach. Left internal jugular catheter is unchanged in position with distal tip in expected position the SVC. There is been interval placement of right internal jugular catheter with distal tip projected over the right ventricle. Stable bilateral perihilar opacities are noted concerning for edema or pneumonia.  IMPRESSION: Endotracheal and nasogastric tubes in grossly good position. Interval placement of right internal jugular catheter with distal tip projected over expected position of right ventricle. Stable bilateral perihilar opacities are noted.   Electronically Signed   By: Roque LiasJames  Green M.D.   On: 12/30/2013 17:54   Dg Chest Port 1 View  12/29/2013   CLINICAL DATA:  Central line placement. Nasogastric tube placement. Meningitis.  EXAM: PORTABLE CHEST - 1 VIEW  COMPARISON:  12/29/2013 at 4:50 p.m.  FINDINGS: Endotracheal tube tip 2.1 cm above the carina. Left IJ line tip: SVC. Nasogastric tube extends into the stomach view beyond the inferior margin of today' s radiograph.  Stable bilateral airspace opacities, low lung volumes, and cardiomegaly.  IMPRESSION: 1. The nasogastric tube has been repositioned and now extends down into the stomach and beyond the inferior margin of today's image compatible with satisfactory positioning.   Electronically Signed   By: Herbie BaltimoreWalt  Liebkemann M.D.   On: 12/29/2013 18:47   Dg Chest Port 1 View  12/29/2013   CLINICAL DATA:   Nasogastric tube placement. ET tube placement. Acute Respiratory failure.  EXAM: PORTABLE ABDOMEN - 1 VIEW; PORTABLE CHEST - 1 VIEW  COMPARISON:  12/29/2013  FINDINGS: Chest x-ray:  The endotracheal tube is an good position, 2.8 cm above the carina. The NG tube is coiled back on itself in the mid esophagus and should be retracted and replaced. Persistent cardiac enlargement and diffuse airspace process in the lungs with bilateral effusions.  Abdomen:  The endotracheal tube is coiled in the mid esophagus and should be removed and replaced. The abdominal bowel gas pattern is  unremarkable.  IMPRESSION: The endotracheal tube is in good position.  The NG tube is coiled in the upper esophagus.  Persistent diffuse airspace process.  Normal abdominal radiograph.   Electronically Signed   By: Loralie Champagne M.D.   On: 12/29/2013 17:17   Dg Abd Portable 1v  12/29/2013   CLINICAL DATA:  Nasogastric tube placement  EXAM: PORTABLE ABDOMEN - 1 VIEW  COMPARISON:  Portable exam 1812 hr compared to 12/29/2013  FINDINGS: Nasogastric tube tip projects over distal gastric antrum.  Nonobstructive bowel gas pattern.  Bones unremarkable.  IMPRESSION: Tip of nasogastric tube projects over distal gastric antrum.   Electronically Signed   By: Ulyses Southward M.D.   On: 12/29/2013 18:38   Dg Abd Portable 1v  12/29/2013   CLINICAL DATA:  Nasogastric tube placement. ET tube placement. Acute Respiratory failure.  EXAM: PORTABLE ABDOMEN - 1 VIEW; PORTABLE CHEST - 1 VIEW  COMPARISON:  12/29/2013  FINDINGS: Chest x-ray:  The endotracheal tube is an good position, 2.8 cm above the carina. The NG tube is coiled back on itself in the mid esophagus and should be retracted and replaced. Persistent cardiac enlargement and diffuse airspace process in the lungs with bilateral effusions.  Abdomen:  The endotracheal tube is coiled in the mid esophagus and should be removed and replaced. The abdominal bowel gas pattern is unremarkable.  IMPRESSION: The  endotracheal tube is in good position.  The NG tube is coiled in the upper esophagus.  Persistent diffuse airspace process.  Normal abdominal radiograph.   Electronically Signed   By: Loralie Champagne M.D.   On: 12/29/2013 17:17     Assessment/Plan: Severe infective endocarditis with group b strep pod #1  2/2 native AV endocarditis c/b meningitis and brain abscesses POD#1 S/P aortic root replacement, AVR, TV repair, and repair of Aorto-R Atrial fistula, and found to have  3rd degree AV block 2/2  infection and surgical repair.  - will narrow antibiotics to ceftriaxone 2gm IV q 12hr. If he continues to have improved Crcl, will start gentamicin on 11/20 with goal of doing 14 days if Cr can tolerate - will d/c vancomycin - will follow fever curve, if it still persists may need to do imaging to look for metastatic disease - await cx on tissue  Benson Porcaro, [facility] Cell: [phone] Pager: [phone]  12/31/2013, 4:41 PM

## 2013-12-31 NOTE — Progress Notes (Signed)
Per MD, placed patient back on previous ventilator settings prior to surgery.  RT will continue to monitor.

## 2013-12-31 NOTE — Consult Note (Signed)
PULMONARY / CRITICAL CARE MEDICINE   Name: Azir Bolerjack MRN: [id number] DOB: Aug 27, 1985    ADMISSION DATE:  12/23/2013 CONSULTATION DATE:  12/29/13  REFERRING MD :  Dr Sharon Seller, [facility]  Reason for consultation:  Respiratory distress, B pulmonary infiltrates  INITIAL PRESENTATION:  28 yo man, admitted 11/11 with meningitis c/b transaminitis and DIC.  LP showed Group B strep and MRI brain showed embolic process. 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 Developed progressive dyspnea and respiratory distress 11/17 with evolving (new) B infiltrates on CXR. Intubated. 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 To OR for emergency repair on 11/18.   STUDIES:  Head CT 12/23/13 >> L frontal sinusitis, no acute findings MRI brain 11/12 >> bacterial meningitis, cerebritis, scattered microabscesses  CXR 11/16 >> no infiltrates B LE doppler US 11/16 >> no LE DVT TEE 11/18 >> Apparent TV vegetation, AV vegetation with severe AI and apparent fistula to the RA w shunt  SIGNIFICANT EVENTS: S/p AVR and proximal aortic root repair 11/18 (Dr Tyrone Sage)  SUBJECTIVE:  Wakes to stim, otherwise sedated  Remains on SIMV  VITAL SIGNS: Temp:  [95.9 F (35.5 C)-101.3 F (38.5 C)] 101.1 F (38.4 C) (11/19 0800) Pulse Rate:  [78-91] 86 (11/19 0800) Resp:  [12-19] 14 (11/19 0800) BP: (83-127)/(39-76) 103/48 mmHg (11/19 0800) SpO2:  [93 %-97 %] 95 % (11/19 0800) Arterial Line BP: (85-129)/(51-80) 129/61 mmHg (11/19 0800) FiO2 (%):  [50 %] 50 % (11/19 0800) Weight:  [100.699 kg (222 lb)] 100.699 kg (222 lb) (11/19 0530) HEMODYNAMICS: PAP: (20-33)/(10-23) 20/14 mmHg CO:  [3.7 L/min-5.5 L/min] 5.5 L/min CI:  [3.6 L/min/m2-5.3 L/min/m2] 5.3 L/min/m2 VENTILATOR SETTINGS: Vent Mode:  [-] SIMV;PSV FiO2 (%):  [50 %] 50 % Set Rate:  [12 bmp] 12 bmp Vt Set:  [610 mL] 610 mL PEEP:  [5 cmH20] 5 cmH20 Pressure Support:  [10 cmH20] 10 cmH20 Plateau Pressure:  [18  cmH20-24 cmH20] 24 cmH20 INTAKE / OUTPUT:  Intake/Output Summary (Last 24 hours) at 12/31/13 0939 Last data filed at 12/31/13 0930  Gross per 24 hour  Intake 7301.66 ml  Output   5994 ml  Net 1307.66 ml    PHYSICAL EXAMINATION: General:  Young man intubated and sedated Neuro:  Asleep, wakes to stim, not following commands  HEENT:  OP clear, ETT in place Cardiovascular:  Tachycardic, regular, 3/6 systolic M heard throughout chest Lungs:  Bilateral inspiratory crackles all fields Abdomen:  Soft, NT, + BS Musculoskeletal:  No deformities, no edema Skin:  No rash  LABS:  CBC  Recent Labs Lab 12/30/13 1650  12/30/13 2222 12/30/13 2223 12/31/13 0400  WBC 38.6*  --   --  31.3* 28.8*  HGB 12.4*  < > 12.6* 12.6* 12.5*  HCT 36.7*  < > 37.0* 36.6* 37.3*  PLT 81*  --   --  110* 140*  < > = values in this interval not displayed. Coag's  Recent Labs Lab 12/26/13 0346 12/29/13 2200 12/30/13 1650  APTT  --  36 44*  INR 1.28 1.84* 2.76*   BMET  Recent Labs Lab 12/29/13 2200 12/30/13 0429  12/30/13 1552 12/30/13 1655 12/30/13 2222 12/30/13 2223 12/31/13 0400  NA 133* 132*  < > 137 139 137  --  139  K 4.8 4.7  < > 3.9 3.6* 5.2  --  5.0  CL 103 102  < > 103  --  104  --  107  CO2 17* 18*  --   --   --   --   --  19  BUN 25* 33*  < > 33*  --  33*  --  32*  CREATININE 1.87* 2.11*  < > 2.10*  --  1.90* 1.78* 1.57*  GLUCOSE 102* 115*  < > 97 80 128*  --  103*  < > = values in this interval not displayed. Electrolytes  Recent Labs Lab 12/25/13 0315  12/27/13 0436  12/29/13 2200 12/30/13 0429 12/30/13 2223 12/31/13 0400  CALCIUM 7.8*  < > 7.6*  < > 7.3* 8.5  --  7.3*  MG 3.2*  --   --   --   --   --  2.4 2.2  PHOS  --   --  4.2  --   --   --   --   --   < > = values in this interval not displayed. Sepsis Markers No results for input(s): LATICACIDVEN, PROCALCITON, O2SATVEN in the last 168 hours. ABG  Recent Labs Lab 12/30/13 1556 12/30/13 1655 12/31/13 0417   PHART 7.314* 7.351 7.347*  PCO2ART 41.4 38.2 42.1  PO2ART 114.0* 65.0* 77.0*   Liver Enzymes  Recent Labs Lab 12/28/13 0320 12/29/13 0331 12/29/13 2200  AST 25 60* 293*  ALT 34 60* 262*  ALKPHOS 97 75 63  BILITOT 1.2 0.8 1.1  ALBUMIN 1.9* 1.9* 1.8*   Cardiac Enzymes  Recent Labs Lab 12/28/13 0320 12/28/13 0726 12/28/13 1250  TROPONINI 0.37* 0.49* 0.58*   Glucose  Recent Labs Lab 12/30/13 2156 12/30/13 2313 12/31/13 0027 12/31/13 0131 12/31/13 0230 12/31/13 0415  GLUCAP 117* 105* 114* 100* 94 109*    Imaging Dg Chest Port 1 View  12/30/2013   CLINICAL DATA:  Endocarditis of aortic valve.  EXAM: PORTABLE CHEST - 1 VIEW  COMPARISON:  December 29, 2013.  FINDINGS: Endotracheal tube is in grossly good position with distal tip 2 cm above the carina. Nasogastric tube is seen entering the stomach. Left internal jugular catheter is unchanged in position with distal tip in expected position the SVC. There is been interval placement of right internal jugular catheter with distal tip projected over the right ventricle. Stable bilateral perihilar opacities are noted concerning for edema or pneumonia.  IMPRESSION: Endotracheal and nasogastric tubes in grossly good position. Interval placement of right internal jugular catheter with distal tip projected over expected position of right ventricle. Stable bilateral perihilar opacities are noted.   Electronically Signed   By: Roque Lias M.D.   On: 12/30/2013 17:54     ASSESSMENT / PLAN:  PULMONARY A: Acute hypoxemic respiratory failure in the setting of bilateral pulmonary infiltrates Bilateral infiltrates due to cardiac decompensation w AI and fistula to RA. At risk also for ALI in setting bacteremia. HCAP possible but less likely now that we know the extent of his cardiac disease P:   - continue MV, not a candidate for extubation 11/19, will change to conventional MV mode, PRVC - Dr. Drue Second managing abx, agree that HCAP likely  not etiology  - will obtain resp cx's - He may benefit from CT scan of the chest to look for embolic pneumonia  CARDIOVASCULAR A: Aortic valvular endocarditis, fistula to RA, s/p repair 11/18 Acute systolic / valvular CHF 3rd degree Heart Block, currently paced w temp wire P:  - diuretics as he can tolerate - will need a permanent pacer when stable to proceed - wean dopamine as able  RENAL A:  Acute renal insufficiency, likely due to ATN, improving 11/19 P:   - follow S Cr and electrolytes  GASTROINTESTINAL A:  No issues P:   - start pantoprazole for SUP  HEMATOLOGIC A:  Thrombocytopenia due to DIC, resolving P:  - follow CBC  INFECTIOUS A:  Group B strep endocarditis, treated by septic brain abscesses and meningitis Possible embolic pneumonias versus HCAP P:   Blood 11/12 >> Negative Blood 11/16 >>  CSF 11/11 >> Group B strep (R to erythromycin)  Abx:  ceftriaxone 2 g 11/11 >> 11/17; 11/18 >> (6 weeks) Cefepime 11/17 >> 11/18 vanco 11/17 >>   - doubt HCAP, treating Group B strep, ID managing  ENDOCRINE A:  Hyperglycemia P:   - SSi per protocol  NEUROLOGIC A:  Mild lethargy, likely related to brain lesions and overall illness.  P:   RASS goal: -1 to -2 - sedation protocol ordered   FAMILY  - Updates:  Reviewed plans 11/17 with patient, girlfriend and cousin at bedside  - Inter-disciplinary family meet or Palliative Care meeting due by: 11/24    TODAY'S SUMMARY: acute respiratory failure in setting of bilateral infiltrates, cardiogenic due to endocarditis. VDRF, not ready for extubation 11/19.     40 minutes Independent CC time   Levy Pupaobert Sophi Calligan, MD, PhD 12/31/2013, 9:39 AM [facility] [phone] or if no answer [phone]

## 2013-12-31 NOTE — Plan of Care (Signed)
Problem: Phase II - Intermediate Post-Op Goal: Maintain Hemodynamic Stability Outcome: Progressing Goal: CBGs/Blood Glucose per SCIP Criteria Outcome: Completed/Met Date Met:  12/31/13

## 2013-12-31 NOTE — Progress Notes (Signed)
Pt's arterial line quit working, RT placed new Art line per MD request.

## 2013-12-31 NOTE — Progress Notes (Signed)
       Patient Name: [name] Date of Encounter: 12/31/2013    SUBJECTIVE: Not awake.  TELEMETRY:  AV seq pacing Filed Vitals:   12/31/13 0900 12/31/13 1000 12/31/13 1100 12/31/13 1116  BP: 105/59 94/52 102/57 131/66  Pulse: 80 83 80 80  Temp: 101.1 F (38.4 C) 100.9 F (38.3 C) 100.8 F (38.2 C)   TempSrc:      Resp: 16 24 14    Height:      Weight:      SpO2: 95% 93% 95%     Intake/Output Summary (Last 24 hours) at 12/31/13 1244 Last data filed at 12/31/13 1100  Gross per 24 hour  Intake 7725.46 ml  Output   6639 ml  Net 1086.46 ml   LABS: Basic Metabolic Panel:  Recent Labs  15/17/61 0429  12/30/13 2222 12/30/13 2223 12/31/13 0400  NA 132*  < > 137  --  139  K 4.7  < > 5.2  --  5.0  CL 102  < > 104  --  107  CO2 18*  --   --   --  19  GLUCOSE 115*  < > 128*  --  103*  BUN 33*  < > 33*  --  32*  CREATININE 2.11*  < > 1.90* 1.78* 1.57*  CALCIUM 8.5  --   --   --  7.3*  MG  --   --   --  2.4 2.2  < > = values in this interval not displayed. CBC:  Recent Labs  12/28/13 1250  12/30/13 2223 12/31/13 0400  WBC  --   < > 31.3* 28.8*  NEUTROABS 23.7*  --   --   --   HGB  --   < > 12.6* 12.5*  HCT  --   < > 36.6* 37.3*  MCV  --   < > 84.7 85.7  PLT  --   < > 110* 140*  < > = values in this interval not displayed. Cardiac Enzymes:  Recent Labs  12/28/13 1250  TROPONINI 0.58*   BNP No results found for: PROBNP  Radiology/Studies:  No new data  Physical Exam: Blood pressure 131/66, pulse 80, temperature 100.8 F (38.2 C), temperature source Core (Comment), resp. rate 14, height 5\' 5"  (1.651 m), weight 222 lb (100.699 kg), SpO2 95 %. Weight change:   Wt Readings from Last 3 Encounters:  12/31/13 222 lb (100.699 kg)   Chest clear anteriorly I-II/VI systolic murmur with no diastolic murmur. No edema  ASSESSMENT:  1. 1. S/P aortic root replacement, AVR, TV repair, and repair of Aorto-R Atrial fistula. 2. 3rd degree AV block and  conduction system was destroyed by infection and surgical repair.  Plan:  1. Follow and assist where needed. 2. DDD pacer once extubated and clinically improving. Will make sure EP aware.  Selinda Eon 12/31/2013, 12:44 PM

## 2013-12-31 NOTE — Progress Notes (Signed)
INITIAL NUTRITION ASSESSMENT  DOCUMENTATION CODES Per approved criteria  -Obesity Unspecified   INTERVENTION:  If TF started, recommend Vital HP formula -- initiate at 15 ml/hr and increase by 10 ml every 4 hours to goal rate of 55 ml/hr with Prostat liquid protein 30 ml BID via tube to provide 1520 kcals (70% of estimated kcal needs), 145 gm protein (100% of estimated protein needs), 1104 ml of free water RD to follow for nutrition care plan  NUTRITION DIAGNOSIS: Inadequate oral intake related to inability to eat as evidenced by NPO status  Goal: Initiation of nutrition support in next 24-48 hours if prolonged intubation expected  Monitor:  TF regimen & tolerance, respiratory status, weight, labs, I/O's  Reason for Assessment: VDRF  28 y.o. male  Admitting Dx: endocarditis, severe AI  ASSESSMENT: 28 year old Male transferred from [facility] for suspected meningitis; LP showed Group B strep and MRI brain showed embolic process;TTE showed tricuspid and possibly aortic endocarditis; developed progressive dyspnea and respiratory distress 11/17 with evolving (new) B infiltrates on CXR.   Patient s/p procedures 11/18: ASCENDING AORTIC ROOT REPLACEMENT  REPAIR OF SEPTAL LEAFLET TRICUSPID VALVE CLOSURE OF PATENT FORAMEN OVALE  Patient is currently intubated on ventilator support -- OGT in place MV:  L/min Temp (24hrs), Avg:99.3 F (37.4 C), Min:95.9 F (35.5 C), Max:101.3 F (38.5 C)   If TF started, EN goal is to provide 60-70% of estimated calorie needs (22-25 kcals/kg ideal body weight) and 100% of estimated protein needs, based on ASPEN guidelines for hypocaloric, high protein feeding in critically ill obese individuals.  Height: Ht Readings from Last 1 Encounters:  12/29/13 5\' 5"  (1.651 m)    Weight: Wt Readings from Last 1 Encounters:  12/31/13 222 lb (100.699 kg)    Ideal Body Weight: 136 lb  % Ideal Body Weight: 163%  Wt Readings from Last 10  Encounters:  12/31/13 222 lb (100.699 kg)    Usual Body Weight: unable to obtain  % Usual Body Weight: ---  BMI:  Body mass index is 36.94 kg/(m^2).  Estimated Nutritional Needs: Kcal: 2185 Protein: 140-150 gm Fluid: per MD  Skin: chest surgical incision   Diet Order: Diet NPO time specified  EDUCATION NEEDS: -No education needs identified at this time   Intake/Output Summary (Last 24 hours) at 12/31/13 1347 Last data filed at 12/31/13 1300  Gross per 24 hour  Intake 7892.06 ml  Output   6839 ml  Net 1053.06 ml    Labs:   Recent Labs Lab 12/25/13 0315  12/27/13 0436  12/29/13 2200 12/30/13 0429  12/30/13 1552 12/30/13 1655 12/30/13 2222 12/30/13 2223 12/31/13 0400  NA 138  < > 141  < > 133* 132*  < > 137 139 137  --  139  K 4.4  < > 4.2  < > 4.8 4.7  < > 3.9 3.6* 5.2  --  5.0  CL 104  < > 107  < > 103 102  < > 103  --  104  --  107  CO2 20  < > 20  < > 17* 18*  --   --   --   --   --  19  BUN 40*  < > 20  < > 25* 33*  < > 33*  --  33*  --  32*  CREATININE 1.30  < > 1.34  < > 1.87* 2.11*  < > 2.10*  --  1.90* 1.78* 1.57*  CALCIUM 7.8*  < >  7.6*  < > 7.3* 8.5  --   --   --   --   --  7.3*  MG 3.2*  --   --   --   --   --   --   --   --   --  2.4 2.2  PHOS  --   --  4.2  --   --   --   --   --   --   --   --   --   GLUCOSE 176*  < > 93  < > 102* 115*  < > 97 80 128*  --  103*  < > = values in this interval not displayed.  CBG (last 3)   Recent Labs  12/31/13 0230 12/31/13 0415 12/31/13 1155  GLUCAP 94 109* 138*    Scheduled Meds: . acetaminophen  1,000 mg Oral 4 times per day   Or  . acetaminophen (TYLENOL) oral liquid 160 mg/5 mL  1,000 mg Per Tube 4 times per day  . antiseptic oral rinse  7 mL Mouth Rinse QID  . bisacodyl  10 mg Oral Daily   Or  . bisacodyl  10 mg Rectal Daily  . cefTRIAXone (ROCEPHIN)  IV  2 g Intravenous Q12H  . chlorhexidine  15 mL Mouth Rinse BID  . docusate sodium  200 mg Oral Daily  . insulin aspart  0-24 Units  Subcutaneous 6 times per day  . pantoprazole (PROTONIX) IV  40 mg Intravenous Q24H  . sodium chloride  3 mL Intravenous Q12H  . vancomycin  1,000 mg Intravenous Q12H    Continuous Infusions: . sodium chloride 20 mL/hr (12/30/13 1645)  . sodium chloride    . sodium chloride 20 mL/hr (12/30/13 1645)  . dexmedetomidine 1.2 mcg/kg/hr (12/31/13 1222)  . DOPamine Stopped (12/31/13 1300)  . lactated ringers 10 mL (12/31/13 0001)  . nitroGLYCERIN 0 mcg/min (12/30/13 1645)  . norepinephrine (LEVOPHED) Adult infusion    . phenylephrine (NEO-SYNEPHRINE) Adult infusion 30 mcg/min (12/31/13 0700)  . propofol 30 mcg/kg/min (12/30/13 0818)    Past Medical History  Diagnosis Date  . Obesity   . Smokeless tobacco use   . Fracture of left femur   . Meningitis     Group B Strep (November 2015)  . Patent foramen ovale     Closed during procedure on December 30, 2013  . History of open heart surgery     December 30, 2013- ascending aortic root replacement, TEE  . Endocarditis     related to meningitis     History reviewed. No pertinent past surgical history.  Maureen Chatters, RD, LDN Pager #: [phone] After-Hours Pager #: [phone]

## 2013-12-31 NOTE — Progress Notes (Signed)
Patient ID: [name], male   DOB: October 08, 1985, 28 y.o.   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 (mL/kg) 4370.2 (43.4) 817.2 (8.1)   Blood 1500    NG/GT 60    IV Piggyback 1200 250   Total Intake(mL/kg) 7130.2 (70.8) 1067.2 (10.6)   Urine (mL/kg/hr) 2495 (1) 2470 (2.1)   Emesis/NG output 150 (0.1)    Blood 2000 (0.8)    Chest Tube 125 (0.1) 46 (0)   Total Output 4770 2516   Net +2360.2 -1448.9          . sodium chloride 20 mL/hr (12/30/13 1645)  . sodium chloride    . sodium chloride 20 mL/hr (12/30/13 1645)  . dexmedetomidine 1.2 mcg/kg/hr (12/31/13 1542)  . DOPamine Stopped (12/31/13 1300)  . lactated ringers 10 mL (12/31/13 0001)  . nitroGLYCERIN 0 mcg/min (12/30/13 1645)  . norepinephrine (LEVOPHED) Adult infusion    . phenylephrine (NEO-SYNEPHRINE) Adult infusion 20 mcg/min (12/31/13 1400)  . propofol 30 mcg/kg/min (12/30/13 0818)     Lab Results  Component Value Date   WBC 21.3* 12/31/2013   HGB 10.5* 12/31/2013   HCT 31.0* 12/31/2013   PLT 149* 12/31/2013   GLUCOSE 137* 12/31/2013   TRIG 119 12/29/2013   ALT 262* 12/29/2013   AST 293* 12/29/2013   NA 138 12/31/2013   K 4.0 12/31/2013   CL 103 12/31/2013   CREATININE 1.40* 12/31/2013   BUN 26* 12/31/2013   CO2 19 12/31/2013   INR 2.76* 12/30/2013   Complete heart block  persists Off dopamine CI 2.4 uop adequate , acute renal failure improving Keep on vent   Delight Ovens MD  [facility]eeper [phone] Office [phone] 12/31/2013 6:31 PM

## 2013-12-31 NOTE — Progress Notes (Signed)
Patient ID: [name], male   DOB: [birth year], 28 y.o.   MRN: [id number] TCTS DAILY ICU PROGRESS NOTE                   [address]            [place] [address]          [phone]   1 Day Post-Op Procedure(s) (LRB): ASCENDING AORTIC ROOT REPLACEMENT with 14mm HOMOGRAFT, CLOSURE OF AORTIC TO RIGHT ATRIAL FISTULA, CLOSURE OF PATENT FORAMEN OVALE (N/A) REPAIR OF TRICUSPID VALVE LEAFLET (N/A) INTRAOPERATIVE TRANSESOPHAGEAL ECHOCARDIOGRAM (N/A)  Total Length of Stay:  LOS: 8 days   Subjective: Sedated on vent,   Objective: Vital signs in last 24 hours: Temp:  [95.9 F (35.5 C)-101.3 F (38.5 C)] 101.3 F (38.5 C) (11/19 0715) Pulse Rate:  [78-91] 87 (11/19 0734) Cardiac Rhythm:  [-] A-V Sequential paced (11/19 0600) Resp:  [12-19] 19 (11/19 0734) BP: (83-127)/(39-76) 127/59 mmHg (11/19 0734) SpO2:  [93 %-97 %] 95 % (11/19 0734) Arterial Line BP: (85-122)/(51-80) 115/57 mmHg (11/19 0715) FiO2 (%):  [50 %] 50 % (11/19 0734) Weight:  [222 lb (100.699 kg)] 222 lb (100.699 kg) (11/19 0530) Cardiac index 6.3  Filed Weights   12/27/13 0341 12/28/13 0500 12/31/13 0530  Weight: 212 lb 1.7 oz (96.212 kg) 212 lb 15.4 oz (96.6 kg) 222 lb (100.699 kg)    Weight change:    Hemodynamic parameters for last 24 hours: PAP: (20-33)/(10-23) 25/18 mmHg CO:  [3.7 L/min-5.5 L/min] 5.5 L/min CI:  [3.6 L/min/m2-5.3 L/min/m2] 5.3 L/min/m2  Intake/Output from previous day: 11/18 0701 - 11/19 0700 In: 7120.2 [I.V.:4360.2; Blood:1500; NG/GT:60; IV Piggyback:1200] Out: 4770 [Urine:2495; Emesis/NG output:150; Blood:2000; Chest Tube:125]  Intake/Output this shift:    Current Meds: Scheduled Meds: . acetaminophen  1,000 mg Oral 4 times per day   Or  . acetaminophen (TYLENOL) oral liquid 160 mg/5 mL  1,000 mg Per Tube 4 times per day  . antiseptic oral rinse  7 mL Mouth Rinse QID  . bisacodyl  10 mg Oral Daily   Or  . bisacodyl  10 mg Rectal Daily  . cefTRIAXone  (ROCEPHIN)  IV  2 g Intravenous Q12H  . chlorhexidine  15 mL Mouth Rinse BID  . docusate sodium  200 mg Oral Daily  . famotidine (PEPCID) IV  20 mg Intravenous Q12H  . furosemide  40 mg Intravenous Once  . insulin aspart  0-24 Units Subcutaneous 6 times per day  . [START ON 01/01/2014] pantoprazole  40 mg Oral Daily  . sodium chloride  3 mL Intravenous Q12H  . vancomycin  1,000 mg Intravenous Q12H   Continuous Infusions: . sodium chloride 20 mL/hr (12/30/13 1645)  . sodium chloride    . sodium chloride 20 mL/hr (12/30/13 1645)  . dexmedetomidine 1.2 mcg/kg/hr (12/31/13 0700)  . DOPamine 5 mcg/kg/min (12/31/13 0700)  . lactated ringers 10 mL (12/31/13 0001)  . nitroGLYCERIN 0 mcg/min (12/30/13 1645)  . norepinephrine (LEVOPHED) Adult infusion    . phenylephrine (NEO-SYNEPHRINE) Adult infusion 30 mcg/min (12/31/13 0700)  . propofol 30 mcg/kg/min (12/30/13 0818)   PRN Meds:.Place/Maintain arterial line **AND** sodium chloride, albumin human, metoprolol, midazolam, morphine injection, ondansetron (ZOFRAN) IV, sodium chloride  General appearance: sedated on vent Neurologic: sedated,moves all extremities when light Heart: paced Lungs: diminished breath sounds bilaterally Abdomen: soft, non-tender; bowel sounds normal; no masses,  no organomegaly Extremities: extremities normal, atraumatic, no cyanosis or edema and Homans sign is negative, no  sign of DVT Wound: sternum stable  Lab Results: CBC:  Recent Labs  12/30/13 2223 12/31/13 0400  WBC 31.3* 28.8*  HGB 12.6* 12.5*  HCT 36.6* 37.3*  PLT 110* 140*   BMET:  Recent Labs  12/30/13 0429  12/30/13 2222 12/30/13 2223 12/31/13 0400  NA 132*  < > 137  --  139  K 4.7  < > 5.2  --  5.0  CL 102  < > 104  --  107  CO2 18*  --   --   --  19  GLUCOSE 115*  < > 128*  --  103*  BUN 33*  < > 33*  --  32*  CREATININE 2.11*  < > 1.90* 1.78* 1.57*  CALCIUM 8.5  --   --   --  7.3*  < > = values in this interval not displayed.    PT/INR:   Recent Labs  12/30/13 1650  LABPROT 29.4*  INR 2.76*   Radiology: Dg Chest Port 1 View  12/31/2013   CLINICAL DATA:  28 year old male with endocarditis and CNS infection. Initial encounter.  EXAM: PORTABLE CHEST - 1 VIEW  COMPARISON:  12/30/2013 and earlier.  FINDINGS: Portable AP semi upright view at at 0559 hrs. Stable endotracheal tube tip just below the clavicles. Enteric tube courses to the abdomen as before. Right IJ approach catheter terminates in the RV apex region as before. Midline mediastinal or chest tube is stable. Stable left IJ central line.  Continued low lung volumes with patchy and confluent bilateral pulmonary opacity superimposed on dense lower lobe collapse or consolidation. No pneumothorax. No large pleural effusion.  IMPRESSION: 1.  Stable lines and tubes. 2. Stable ventilation with widespread pulmonary opacity and dense lower lobe collapse or consolidation.   Electronically Signed   By: Augusto GambleLee  Hall M.D.   On: 12/31/2013 07:49      Assessment/Plan: S/P Procedure(s) (LRB): ASCENDING AORTIC ROOT REPLACEMENT with 23mm HOMOGRAFT, CLOSURE OF AORTIC TO RIGHT ATRIAL FISTULA, CLOSURE OF PATENT FORAMEN OVALE (N/A) REPAIR OF TRICUSPID VALVE LEAFLET (N/A) INTRAOPERATIVE TRANSESOPHAGEAL ECHOCARDIOGRAM (N/A) Expected Acute  Blood - loss Anemia Acute renal failure present preop improving with decreasing cr and increased uop Diffuse pulmonary infiltrates, present preop will prevent trying to wean vent today Complete heart block will need PPM considering the root abscess was in the  Area of the conduction system Wean dopamine to 3 Antibiotic management per ID   Levi Klaiber B 12/31/2013 8:05 AM

## 2014-01-01 ENCOUNTER — Encounter (HOSPITAL_COMMUNITY): Payer: Self-pay | Admitting: Cardiothoracic Surgery

## 2014-01-01 ENCOUNTER — Inpatient Hospital Stay (HOSPITAL_COMMUNITY): Payer: BC Managed Care – PPO

## 2014-01-01 LAB — GLUCOSE, CAPILLARY
Glucose-Capillary: 125 mg/dL — ABNORMAL HIGH (ref 70–99)
Glucose-Capillary: 111 mg/dL — ABNORMAL HIGH (ref 70–99)
Glucose-Capillary: 103 mg/dL — ABNORMAL HIGH (ref 70–99)
Glucose-Capillary: 111 mg/dL — ABNORMAL HIGH (ref 70–99)
Glucose-Capillary: 127 mg/dL — ABNORMAL HIGH (ref 70–99)
Glucose-Capillary: 98 mg/dL (ref 70–99)

## 2014-01-01 LAB — COMPREHENSIVE METABOLIC PANEL
ALT: 282 U/L — ABNORMAL HIGH (ref 0–53)
AST: 178 U/L — ABNORMAL HIGH (ref 0–37)
Albumin: 1.6 g/dL — ABNORMAL LOW (ref 3.5–5.2)
Alkaline Phosphatase: 72 U/L (ref 39–117)
Anion gap: 10 (ref 5–15)
BUN: 27 mg/dL — ABNORMAL HIGH (ref 6–23)
CO2: 23 mEq/L (ref 19–32)
Calcium: 7.5 mg/dL — ABNORMAL LOW (ref 8.4–10.5)
Chloride: 107 mEq/L (ref 96–112)
Creatinine, Ser: 1.03 mg/dL (ref 0.50–1.35)
GFR calc Af Amer: >90 mL/min (ref >90)
GFR calc non Af Amer: >90 mL/min (ref >90)
Glucose, Bld: 123 mg/dL — ABNORMAL HIGH (ref 70–99)
Potassium: 4 mEq/L (ref 3.7–5.3)
Sodium: 140 mEq/L (ref 137–147)
Total Bilirubin: 1.5 mg/dL — ABNORMAL HIGH (ref 0.3–1.2)
Total Protein: 4.5 g/dL — ABNORMAL LOW (ref 6.0–8.3)

## 2014-01-01 LAB — URINE MICROSCOPIC-ADD ON

## 2014-01-01 LAB — URINALYSIS, ROUTINE W REFLEX MICROSCOPIC
Glucose, UA: NEGATIVE mg/dL
Hgb urine dipstick: NEGATIVE
Ketones, ur: 15 mg/dL — AB
Nitrite: POSITIVE — AB
Protein, ur: 30 mg/dL — AB
Specific Gravity, Urine: 1.033 — ABNORMAL HIGH (ref 1.005–1.030)
Urobilinogen, UA: 1 mg/dL (ref 0.0–1.0)
pH: 6 (ref 5.0–8.0)

## 2014-01-01 LAB — CBC
HCT: 30 % — ABNORMAL LOW (ref 39.0–52.0)
Hemoglobin: 10 g/dL — ABNORMAL LOW (ref 13.0–17.0)
MCH: 28.9 pg (ref 26.0–34.0)
MCHC: 33.3 g/dL (ref 30.0–36.0)
MCV: 86.7 fL (ref 78.0–100.0)
Platelets: 152 10*3/uL (ref 150–400)
RBC: 3.46 MIL/uL — ABNORMAL LOW (ref 4.22–5.81)
RDW: 14.4 % (ref 11.5–15.5)
WBC: 18.7 10*3/uL — ABNORMAL HIGH (ref 4.0–10.5)

## 2014-01-01 LAB — PROTIME-INR
INR: 1.58 — ABNORMAL HIGH (ref 0.00–1.49)
Prothrombin Time: 19 seconds — ABNORMAL HIGH (ref 11.6–15.2)

## 2014-01-01 MED ORDER — GENTAMICIN IN SALINE 1.6-0.9 MG/ML-% IV SOLN
80.0000 mg | Freq: Three times a day (TID) | INTRAVENOUS | Status: DC
  Administered 2014-01-01 – 2014-01-05 (×13): 80 mg via INTRAVENOUS
  Filled 2014-01-01 (×14): qty 50

## 2014-01-01 MED ORDER — SODIUM CHLORIDE 0.9 % IJ SOLN
10.0000 mL | Freq: Two times a day (BID) | INTRAMUSCULAR | Status: DC
  Administered 2014-01-01 – 2014-01-05 (×8): 10 mL via INTRAVENOUS

## 2014-01-01 MED ORDER — ACETAMINOPHEN 650 MG RE SUPP
650.0000 mg | Freq: Four times a day (QID) | RECTAL | Status: DC
  Filled 2014-01-01 (×8): qty 1

## 2014-01-01 MED ORDER — FUROSEMIDE 10 MG/ML IJ SOLN
40.0000 mg | Freq: Once | INTRAMUSCULAR | Status: AC
  Administered 2014-01-01: 40 mg via INTRAVENOUS
  Filled 2014-01-01: qty 4

## 2014-01-01 MED ORDER — SODIUM CHLORIDE 0.9 % IJ SOLN
10.0000 mL | INTRAMUSCULAR | Status: DC | PRN
  Administered 2014-01-04: 10 mL via INTRAVENOUS
  Filled 2014-01-01: qty 10

## 2014-01-01 MED ORDER — SODIUM CHLORIDE 0.9 % IV SOLN: INTRAVENOUS | Status: DC | PRN

## 2014-01-01 MED FILL — Sodium Chloride IV Soln 0.9%: INTRAVENOUS | Qty: 3000 | Status: AC

## 2014-01-01 MED FILL — Electrolyte-R (PH 7.4) Solution: INTRAVENOUS | Qty: 3000 | Status: AC

## 2014-01-01 MED FILL — Sodium Bicarbonate IV Soln 8.4%: INTRAVENOUS | Qty: 150 | Status: AC

## 2014-01-01 MED FILL — Heparin Sodium (Porcine) Inj 1000 Unit/ML: INTRAMUSCULAR | Qty: 10 | Status: AC

## 2014-01-01 MED FILL — Lidocaine HCl IV Inj 20 MG/ML: INTRAVENOUS | Qty: 5 | Status: AC

## 2014-01-01 MED FILL — Mannitol IV Soln 20%: INTRAVENOUS | Qty: 500 | Status: AC

## 2014-01-01 NOTE — Progress Notes (Signed)
TCTS DAILY ICU PROGRESS NOTE                   [address]            [place] [address]          [phone]   2 Days Post-Op Procedure(s) (LRB): ASCENDING AORTIC ROOT REPLACEMENT with 63mm HOMOGRAFT, CLOSURE OF AORTIC TO RIGHT ATRIAL FISTULA, CLOSURE OF PATENT FORAMEN OVALE (N/A) REPAIR OF TRICUSPID VALVE LEAFLET (N/A) INTRAOPERATIVE TRANSESOPHAGEAL ECHOCARDIOGRAM (N/A)  Total Length of Stay:  LOS: 9 days   Subjective: Remains sedated  Objective: Vital signs in last 24 hours: Temp:  [96.8 F (36 C)-101.1 F (38.4 C)] 97 F (36.1 C) (11/20 0700) Pulse Rate:  [80-182] 182 (11/20 0751) Cardiac Rhythm:  [-] A-V Sequential paced (11/20 0700) Resp:  [14-24] 14 (11/20 0700) BP: (87-131)/(45-76) 127/76 mmHg (11/20 0751) SpO2:  [93 %-98 %] 98 % (11/20 0751) Arterial Line BP: (96-130)/(48-76) 115/70 mmHg (11/20 0700) FiO2 (%):  [40 %] 40 % (11/20 0751) Weight:  [218 lb 12.8 oz (99.247 kg)] 218 lb 12.8 oz (99.247 kg) (11/20 0400)  Filed Weights   12/28/13 0500 12/31/13 0530 01/01/14 0400  Weight: 212 lb 15.4 oz (96.6 kg) 222 lb (100.699 kg) 218 lb 12.8 oz (99.247 kg)    Weight change: -3 lb 3.2 oz (-1.452 kg)   Hemodynamic parameters for last 24 hours: PAP: (16-30)/(10-19) 18/11 mmHg CVP:  [10 mmHg-14 mmHg] 10 mmHg CO:  [4.6 L/min-5.9 L/min] 4.6 L/min CI:  [2.3 L/min/m2-5.7 L/min/m2] 2.3 L/min/m2  Intake/Output from previous day: 11/19 0701 - 11/20 0700 In: 2001.7 [I.V.:1701.7; IV Piggyback:300] Out: 3726 [Urine:3070; Emesis/NG output:535; Chest Tube:121]  Intake/Output this shift:    Current Meds: Scheduled Meds: . acetaminophen  1,000 mg Oral 4 times per day   Or  . acetaminophen (TYLENOL) oral liquid 160 mg/5 mL  1,000 mg Per Tube 4 times per day  . antiseptic oral rinse  7 mL Mouth Rinse QID  . bisacodyl  10 mg Oral Daily   Or  . bisacodyl  10 mg Rectal Daily  . cefTRIAXone (ROCEPHIN)  IV  2 g Intravenous Q12H  . chlorhexidine  15 mL Mouth  Rinse BID  . docusate sodium  200 mg Oral Daily  . furosemide  40 mg Intravenous Once  . insulin aspart  0-24 Units Subcutaneous 6 times per day  . pantoprazole (PROTONIX) IV  40 mg Intravenous Q24H  . sodium chloride  3 mL Intravenous Q12H   Continuous Infusions: . sodium chloride 20 mL/hr at 01/01/14 0600  . sodium chloride    . sodium chloride 20 mL/hr (12/30/13 1645)  . dexmedetomidine 1.2 mcg/kg/hr (01/01/14 0600)  . DOPamine Stopped (12/31/13 1300)  . lactated ringers 10 mL/hr at 01/01/14 0600  . nitroGLYCERIN 0 mcg/min (12/30/13 1645)  . norepinephrine (LEVOPHED) Adult infusion    . phenylephrine (NEO-SYNEPHRINE) Adult infusion 10 mcg/min (01/01/14 0600)  . propofol 30 mcg/kg/min (12/30/13 0818)   PRN Meds:.Place/Maintain arterial line **AND** sodium chloride, Place/Maintain arterial line **AND** sodium chloride, Place/Maintain arterial line **AND** sodium chloride, metoprolol, midazolam, morphine injection, ondansetron (ZOFRAN) IV, sodium chloride  General appearance: sedated on vent Neurologic: sedated Heart: regular rate and rhythm, S1, S2 normal, no murmur, click, rub or gallop Lungs: diminished breath sounds bilaterally Abdomen: soft, non-tender; bowel sounds normal; no masses,  no organomegaly Extremities: extremities normal, atraumatic, no cyanosis or edema and Homans sign is negative, no sign of DVT Wound: sternum stable  Lab Results: CBC: Recent Labs  12/31/13 1730 12/31/13 1734 01/01/14 0430  WBC 21.3*  --  18.7*  HGB 10.5* 10.5* 10.0*  HCT 31.4* 31.0* 30.0*  PLT 149*  --  152   BMET:  Recent Labs  12/31/13 0400  12/31/13 1734 01/01/14 0430  NA 139  --  138 140  K 5.0  --  4.0 4.0  CL 107  --  103 107  CO2 19  --   --  23  GLUCOSE 103*  --  137* 123*  BUN 32*  --  26* 27*  CREATININE 1.57*  < > 1.40* 1.03  CALCIUM 7.3*  --   --  7.5*  < > = values in this interval not displayed.  PT/INR:  Recent Labs  01/01/14 0430  LABPROT 19.0*  INR  1.58*   Radiology: Dg Chest Port 1 View  12/31/2013   CLINICAL DATA:  28 year old male with endocarditis and CNS infection. Initial encounter.  EXAM: PORTABLE CHEST - 1 VIEW  COMPARISON:  12/30/2013 and earlier.  FINDINGS: Portable AP semi upright view at at 0559 hrs. Stable endotracheal tube tip just below the clavicles. Enteric tube courses to the abdomen as before. Right IJ approach catheter terminates in the RV apex region as before. Midline mediastinal or chest tube is stable. Stable left IJ central line.  Continued low lung volumes with patchy and confluent bilateral pulmonary opacity superimposed on dense lower lobe collapse or consolidation. No pneumothorax. No large pleural effusion.  IMPRESSION: 1.  Stable lines and tubes. 2. Stable ventilation with widespread pulmonary opacity and dense lower lobe collapse or consolidation.   Electronically Signed   By: Augusto GambleLee  Hall M.D.   On: 12/31/2013 07:49   Dg Chest Port 1 View  12/30/2013   CLINICAL DATA:  Endocarditis of aortic valve.  EXAM: PORTABLE CHEST - 1 VIEW  COMPARISON:  December 29, 2013.  FINDINGS: Endotracheal tube is in grossly good position with distal tip 2 cm above the carina. Nasogastric tube is seen entering the stomach. Left internal jugular catheter is unchanged in position with distal tip in expected position the SVC. There is been interval placement of right internal jugular catheter with distal tip projected over the right ventricle. Stable bilateral perihilar opacities are noted concerning for edema or pneumonia.  IMPRESSION: Endotracheal and nasogastric tubes in grossly good position. Interval placement of right internal jugular catheter with distal tip projected over expected position of right ventricle. Stable bilateral perihilar opacities are noted.   Electronically Signed   By: Roque LiasJames  Green M.D.   On: 12/30/2013 17:54     Assessment/Plan: S/P Procedure(s) (LRB): ASCENDING AORTIC ROOT REPLACEMENT with 23mm HOMOGRAFT, CLOSURE OF  AORTIC TO RIGHT ATRIAL FISTULA, CLOSURE OF PATENT FORAMEN OVALE (N/A) REPAIR OF TRICUSPID VALVE LEAFLET (N/A) INTRAOPERATIVE TRANSESOPHAGEAL ECHOCARDIOGRAM (N/A) Chest xray cleared some Decrease sedation to evaluate mental status and poss attempts at weaning vent Cardiology notified about need for pacer  D/c chest tube   Yuval Nolet B 01/01/2014 8:31 AM

## 2014-01-01 NOTE — Progress Notes (Signed)
CT surgery p.m. Rounds  Patient hemodynamically stable Patient self extubated this afternoon but respiratory status is satisfactory with good air movement and oxygen saturation 95%. Chest x-ray pending in a.m. AV paced for underlying heart block-epicardial temporary pacing wires without problems with capture P.m. labs reviewed and are satisfactory Patient resting comfortably Patient will need swallow assessment prior to instituting diet

## 2014-01-01 NOTE — Progress Notes (Signed)
PULMONARY / CRITICAL CARE MEDICINE   Name: [name] MRN: [id number] DOB: [birth year]    ADMISSION DATE:  12/23/2013 CONSULTATION DATE:  12/29/13  REFERRING MD :  Dr Sharon Seller, [facility]  Reason for consultation:  Respiratory distress, B pulmonary infiltrates  INITIAL PRESENTATION:  28 yo man, admitted 11/11 with meningitis c/b transaminitis and DIC.  LP showed Group B strep and MRI brain showed embolic process. 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 Developed progressive dyspnea and respiratory distress 11/17 with evolving (new) B infiltrates on CXR. Intubated. 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 To OR for emergency repair on 11/18.   STUDIES:  Head CT 12/23/13 >> L frontal sinusitis, no acute findings MRI brain 11/12 >> bacterial meningitis, cerebritis, scattered microabscesses  CXR 11/16 >> no infiltrates B LE doppler US 11/16 >> no LE DVT TEE 11/18 >> Apparent TV vegetation, AV vegetation with severe AI and apparent fistula to the RA w shunt  SIGNIFICANT EVENTS: S/p AVR and proximal aortic root repair 11/18 (Dr Tyrone Sage)  SUBJECTIVE:  Significant improvement MS PEEP 8, FiO2 down to 0.40  VITAL SIGNS: Temp:  [96.8 F (36 C)-100.8 F (38.2 C)] 97.3 F (36.3 C) (11/20 0900) Pulse Rate:  [80-182] 91 (11/20 0900) Resp:  [14-20] 14 (11/20 0900) BP: (87-131)/(45-76) 108/70 mmHg (11/20 0900) SpO2:  [93 %-98 %] 98 % (11/20 0900) Arterial Line BP: (96-130)/(48-76) 118/72 mmHg (11/20 0900) FiO2 (%):  [40 %] 40 % (11/20 0800) Weight:  [99.247 kg (218 lb 12.8 oz)] 99.247 kg (218 lb 12.8 oz) (11/20 0400) HEMODYNAMICS: PAP: (17-30)/(11-19) 18/12 mmHg CVP:  [10 mmHg-14 mmHg] 11 mmHg CO:  [4.6 L/min-5.9 L/min] 5.5 L/min CI:  [2.3 L/min/m2-5.7 L/min/m2] 2.7 L/min/m2 VENTILATOR SETTINGS: Vent Mode:  [-] PRVC FiO2 (%):  [40 %] 40 % Set Rate:  [14 bmp] 14 bmp Vt Set:  [600 mL] 600 mL PEEP:  [8 cmH20] 8 cmH20 Plateau Pressure:  [23  cmH20-26 cmH20] 26 cmH20 INTAKE / OUTPUT:  Intake/Output Summary (Last 24 hours) at 01/01/14 1037 Last data filed at 01/01/14 0900  Gross per 24 hour  Intake 1581.2 ml  Output   2397 ml  Net -815.8 ml    PHYSICAL EXAMINATION: General:  Young man intubated and sedated Neuro:  Asleep, wakes to stim and following commands 11/20 HEENT:  OP clear, ETT in place Cardiovascular:  Tachycardic, regular, 3/6 systolic M heard throughout chest Lungs:  Bilateral inspiratory crackles all fields Abdomen:  Soft, NT, + BS Musculoskeletal:  No deformities, no edema Skin:  No rash  LABS:  CBC  Recent Labs Lab 12/31/13 0400 12/31/13 1730 12/31/13 1734 01/01/14 0430  WBC 28.8* 21.3*  --  18.7*  HGB 12.5* 10.5* 10.5* 10.0*  HCT 37.3* 31.4* 31.0* 30.0*  PLT 140* 149*  --  152   Coag's  Recent Labs Lab 12/29/13 2200 12/30/13 1650 01/01/14 0430  APTT 36 44*  --   INR 1.84* 2.76* 1.58*   BMET  Recent Labs Lab 12/30/13 0429  12/31/13 0400 12/31/13 1730 12/31/13 1734 01/01/14 0430  NA 132*  < > 139  --  138 140  K 4.7  < > 5.0  --  4.0 4.0  CL 102  < > 107  --  103 107  CO2 18*  --  19  --   --  23  BUN 33*  < > 32*  --  26* 27*  CREATININE 2.11*  < > 1.57* 1.23 1.40*  1.03  GLUCOSE 115*  < > 103*  --  137* 123*  < > = values in this interval not displayed. Electrolytes  Recent Labs Lab 12/27/13 0436  12/30/13 0429 12/30/13 2223 12/31/13 0400 12/31/13 1730 01/01/14 0430  CALCIUM 7.6*  < > 8.5  --  7.3*  --  7.5*  MG  --   --   --  2.4 2.2 2.1  --   PHOS 4.2  --   --   --   --   --   --   < > = values in this interval not displayed. Sepsis Markers No results for input(s): LATICACIDVEN, PROCALCITON, O2SATVEN in the last 168 hours. ABG  Recent Labs Lab 12/30/13 1655 12/31/13 0417 12/31/13 1127  PHART 7.351 7.347* 7.423  PCO2ART 38.2 42.1 40.0  PO2ART 65.0* 77.0* 84.0   Liver Enzymes  Recent Labs Lab 12/29/13 0331 12/29/13 2200 01/01/14 0430  AST 60*  293* 178*  ALT 60* 262* 282*  ALKPHOS 75 63 72  BILITOT 0.8 1.1 1.5*  ALBUMIN 1.9* 1.8* 1.6*   Cardiac Enzymes  Recent Labs Lab 12/28/13 0320 12/28/13 0726 12/28/13 1250  TROPONINI 0.37* 0.49* 0.58*   Glucose  Recent Labs Lab 12/31/13 0230 12/31/13 0415 12/31/13 0722 12/31/13 1155 12/31/13 1529 12/31/13 2017  GLUCAP 94 109* 118* 138* 132* 118*    Imaging Dg Chest Port 1 View  12/31/2013   CLINICAL DATA:  28 year old male with endocarditis and CNS infection. Initial encounter.  EXAM: PORTABLE CHEST - 1 VIEW  COMPARISON:  12/30/2013 and earlier.  FINDINGS: Portable AP semi upright view at at 0559 hrs. Stable endotracheal tube tip just below the clavicles. Enteric tube courses to the abdomen as before. Right IJ approach catheter terminates in the RV apex region as before. Midline mediastinal or chest tube is stable. Stable left IJ central line.  Continued low lung volumes with patchy and confluent bilateral pulmonary opacity superimposed on dense lower lobe collapse or consolidation. No pneumothorax. No large pleural effusion.  IMPRESSION: 1.  Stable lines and tubes. 2. Stable ventilation with widespread pulmonary opacity and dense lower lobe collapse or consolidation.   Electronically Signed   By: Augusto GambleLee  Hall M.D.   On: 12/31/2013 07:49     ASSESSMENT / PLAN:  PULMONARY A: Acute hypoxemic respiratory failure in the setting of bilateral pulmonary infiltrates Bilateral infiltrates due to cardiac decompensation w AI and fistula to RA. At risk also for ALI in setting bacteremia. HCAP is  possible but less likely now that we know the extent of his cardiac disease P:   - continue MV, plan to decrease PEEP to 5, try PSV 11/20 pm if he tolerates. Suspect he is a candidate for extubation soon - Dr. 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, s/p repair 11/18 Acute systolic / valvular CHF 3rd degree Heart Block, currently paced w temp wire P:  - diuretics as he can tolerate - will need a permanent pacer when stable to proceed  RENAL A:  Acute renal insufficiency, likely due to ATN, improving 11/19-20 P:   - follow S Cr and electrolytes  GASTROINTESTINAL A:  No issues P:   - pantoprazole for SUP  HEMATOLOGIC A:  Thrombocytopenia due to DIC, resolving P:  - follow CBC  INFECTIOUS A:  Group B strep  endocarditis, treated by septic brain abscesses and meningitis Possible embolic pneumonias versus HCAP P:   Blood 11/12 >> Negative Blood 11/16 >>  CSF 11/11 >> Group B strep (R to erythromycin)  Abx:  ceftriaxone 2 g 11/11 >> 11/17; 11/18 >> (6 weeks) Cefepime 11/17 >> 11/18 vanco 11/17 >> 11/19 Gentamicin 11/20 >>   - doubt HCAP, treating Group B strep, ID managing.   ENDOCRINE A:  Hyperglycemia P:   - SSi per protocol  NEUROLOGIC A:  Mild lethargy, likely related to brain lesions and overall illness.  P:   RASS goal: -1 to -2 - sedation protocol ordered   FAMILY  - Updates:  Reviewed plans with mother at bedside 11/20  - Inter-disciplinary family meet or Palliative Care meeting due by: 11/24    TODAY'S SUMMARY: acute respiratory failure in setting of bilateral infiltrates, cardiogenic due to endocarditis. VDRF. Improving, anticipate extubation soon.     40 minutes Independent CC time   Levy Pupa, MD, PhD 01/01/2014, 10:37 AM [facility] [phone] or if no answer [phone]

## 2014-01-01 NOTE — Progress Notes (Signed)
Patient HR on monitor is reading 182bpm, monitor is double counting pacing spikes, portable pulse ox place on patient to monitor accurate HR  Patient is AV pacing at 91bpm. Will monitor

## 2014-01-01 NOTE — Anesthesia Postprocedure Evaluation (Signed)
  Anesthesia Post-op Note  Patient: [name]  Procedure(s) Performed: Procedure(s): ASCENDING AORTIC ROOT REPLACEMENT with 49mm HOMOGRAFT, CLOSURE OF AORTIC TO RIGHT ATRIAL FISTULA, CLOSURE OF PATENT FORAMEN OVALE (N/A) REPAIR OF TRICUSPID VALVE LEAFLET (N/A) INTRAOPERATIVE TRANSESOPHAGEAL ECHOCARDIOGRAM (N/A)  Patient Location: SICU  Anesthesia Type:General  Level of Consciousness: oriented, sedated, patient cooperative and responds to stimulation  Airway and Oxygen Therapy: Patient Spontanous Breathing and Patient connected to nasal cannula oxygen  Post-op Pain: none  Post-op Assessment: Post-op Vital signs reviewed, Patient's Cardiovascular Status Stable, Respiratory Function Stable, Patent Airway, No signs of Nausea or vomiting, Adequate PO intake and Pain level controlled  Post-op Vital Signs: Reviewed and stable  Last Vitals:  Filed Vitals:   01/01/14 1300  BP: 97/59  Pulse: 91  Temp: 37.5 C  Resp: 19    Complications: No apparent anesthesia complications

## 2014-01-01 NOTE — Progress Notes (Signed)
While too soon for permanent pacer, I will alert EP to follow so tnat we are ready to place when clinically appropriate. This is to avoid having patient drop of our list of patients currently being followed.

## 2014-01-01 NOTE — Progress Notes (Signed)
ANTIBIOTIC CONSULT NOTE - INITIAL  Pharmacy Consult for Gentamicin Indication: Synergy with Group B Strep Native Valve Endocarditis (planned duration: 14 days)  No Known Allergies  Patient Measurements: Height: 5\' 5"  (165.1 cm) Weight: 218 lb 12.8 oz (99.247 kg) IBW/kg (Calculated) : 61.5  Adjusted body weight: 77 kg  Vital Signs: Temp: 97.3 F (36.3 C) (11/20 0900) Temp Source: Core (Comment) (11/20 0800) BP: 108/70 mmHg (11/20 0900) Pulse Rate: 91 (11/20 0900) Intake/Output from previous day: 11/19 0701 - 11/20 0700 In: 2001.7 [I.V.:1701.7; IV Piggyback:300] Out: 3726 [Urine:3070; Emesis/NG output:535; Chest Tube:121] Intake/Output from this shift: Total I/O In: 87.9 [I.V.:87.9] Out: 295 [Urine:285; Chest Tube:10]  Labs:  Recent Labs  12/31/13 0400 12/31/13 1730 12/31/13 1734 01/01/14 0430  WBC 28.8* 21.3*  --  18.7*  HGB 12.5* 10.5* 10.5* 10.0*  PLT 140* 149*  --  152  CREATININE 1.57* 1.23 1.40* 1.03   Estimated Creatinine Clearance: 117.8 mL/min (by C-G formula based on Cr of 1.03). No results for input(s): VANCOTROUGH, VANCOPEAK, VANCORANDOM, GENTTROUGH, GENTPEAK, GENTRANDOM, TOBRATROUGH, TOBRAPEAK, TOBRARND, AMIKACINPEAK, AMIKACINTROU, AMIKACIN in the last 72 hours.   Microbiology: Recent Results (from the past 720 hour(s))  CSF culture     Status: None   Collection Time: 12/23/13 12:20 PM  Result Value Ref Range Status   Specimen Description CSF  Final   Special Requests NONE  Final   Gram Stain   Final    CYOSPIN NO WBC SEEN NO ORGANISMS SEEN Performed at [facility]    Culture   Final    FEW GROUP B STREP(S.AGALACTIAE)ISOLATED Note: CRITICAL RESULT CALLED TO, READ BACK BY AND VERIFIED WITH: STEPHANIE SHAW @ 0744 ON [id number] BY [facility] Performed at [facility]    Report Status 12/25/2013 FINAL  Final   Organism ID, Bacteria GROUP B STREP(S.AGALACTIAE)ISOLATED  Final      Susceptibility   Group b strep(s.agalactiae)isolated - MIC*     ERYTHROMYCIN >=8 RESISTANT Resistant     PENICILLIN <=0.06 SENSITIVE Sensitive     VANCOMYCIN 0.5 SENSITIVE Sensitive     AMPICILLIN <=0.25 SENSITIVE Sensitive     * FEW GROUP B STREP(S.AGALACTIAE)ISOLATED  MRSA PCR Screening     Status: None   Collection Time: 12/23/13  5:27 PM  Result Value Ref Range Status   MRSA by PCR NEGATIVE NEGATIVE Final    Comment:        The GeneXpert MRSA Assay (FDA approved for NASAL specimens only), is one component of a comprehensive MRSA colonization surveillance program. It is not intended to diagnose MRSA infection nor to guide or monitor treatment for MRSA infections.   Culture, blood (routine x 2)     Status: None   Collection Time: 12/24/13 10:50 AM  Result Value Ref Range Status   Specimen Description BLOOD RIGHT HAND  Final   Special Requests   Final    BOTTLES DRAWN AEROBIC AND ANAEROBIC 6CC AER 5CC ANA   Culture  Setup Time   Final    12/24/2013 18:48 Performed at [facility]    Culture   Final    NO GROWTH 5 DAYS Performed at [facility]    Report Status 12/30/2013 FINAL  Final  Culture, blood (routine x 2)     Status: None   Collection Time: 12/24/13 10:58 AM  Result Value Ref Range Status   Specimen Description BLOOD LEFT ANTECUBITAL  Final   Special Requests BOTTLES DRAWN AEROBIC AND ANAEROBIC 10CC  Final   Culture  Setup Time   Final    12/24/2013 18:48 Performed at [facility]    Culture   Final    NO GROWTH 5 DAYS Performed at [facility]    Report Status 12/30/2013 FINAL  Final  Culture, blood (routine x 2)     Status: None (Preliminary result)   Collection Time: 12/28/13 11:50 AM  Result Value Ref Range Status   Specimen Description BLOOD LEFT ANTECUBITAL  Final   Special Requests BOTTLES DRAWN AEROBIC AND ANAEROBIC 10CC  Final   Culture  Setup Time   Final    12/28/2013 16:46 Performed at [facility]    Culture   Final           BLOOD CULTURE RECEIVED NO  GROWTH TO DATE CULTURE WILL BE HELD FOR 5 DAYS BEFORE ISSUING A FINAL NEGATIVE REPORT Performed at [facility]    Report Status PENDING  Incomplete  Culture, blood (routine x 2)     Status: None (Preliminary result)   Collection Time: 12/28/13 12:05 PM  Result Value Ref Range Status   Specimen Description BLOOD LEFT HAND  Final   Special Requests BOTTLES DRAWN AEROBIC ONLY 10CC  Final   Culture  Setup Time   Final    12/28/2013 16:46 Performed at [facility]    Culture   Final           BLOOD CULTURE RECEIVED NO GROWTH TO DATE CULTURE WILL BE HELD FOR 5 DAYS BEFORE ISSUING A FINAL NEGATIVE REPORT Performed at [facility]    Report Status PENDING  Incomplete  Culture, Urine     Status: None   Collection Time: 12/29/13  3:32 PM  Result Value Ref Range Status   Specimen Description URINE, CATHETERIZED  Final   Special Requests NONE  Final   Culture  Setup Time   Final    12/29/2013 21:29 Performed at [facility]    Colony Count NO GROWTH Performed at [facility]   Final   Culture NO GROWTH Performed at [facility]   Final   Report Status 12/30/2013 FINAL  Final  Tissue culture     Status: None (Preliminary result)   Collection Time: 12/30/13 11:18 AM  Result Value Ref Range Status   Specimen Description TISSUE VALVE  Final   Special Requests A) AORTIC VALVE W/ VEGETATION  Final   Gram Stain   Final    ABUNDANT WBC PRESENT,BOTH PMN AND MONONUCLEAR NO SQUAMOUS EPITHELIAL CELLS SEEN ABUNDANT GRAM POSITIVE COCCI IN PAIRS IN CHAINS Performed at [facility]    Culture   Final    NO GROWTH 2 DAYS Performed at [facility]    Report Status PENDING  Incomplete  Fungus Culture with Smear     Status: None (Preliminary result)   Collection Time: 12/30/13 11:18 AM  Result Value Ref Range Status   Specimen Description TISSUE VALVE  Final   Special Requests A) AORTIC VALVE W/ VEGETATION  Final   Fungal Smear    Final    NO YEAST OR FUNGAL ELEMENTS SEEN Performed at [facility]    Culture   Final    CULTURE IN PROGRESS FOR FOUR WEEKS Performed at [facility]    Report Status PENDING  Incomplete  Gram stain     Status: None   Collection Time: 12/30/13 11:18 AM  Result Value Ref Range Status   Specimen Description TISSUE VALVE  Final  Special Requests A) AORTIC VALVE W/ VEGETATION  Final   Gram Stain   Final    ABUNDANT WBC PRESENT,BOTH PMN AND MONONUCLEAR ABUNDANT GRAM POSITIVE COCCI IN PAIRS IN CHAINS    Report Status 12/30/2013 FINAL  Final  Tissue culture     Status: None (Preliminary result)   Collection Time: 12/30/13 11:20 AM  Result Value Ref Range Status   Specimen Description TISSUE OTHER  Final   Special Requests B) RIGHT ATRIUM W/ VEGETATION  Final   Gram Stain   Final    MODERATE WBC PRESENT,BOTH PMN AND MONONUCLEAR NO SQUAMOUS EPITHELIAL CELLS SEEN RARE GRAM POSITIVE COCCI IN PAIRS Performed at [facility]    Culture   Final    NO GROWTH 2 DAYS Performed at [facility]    Report Status PENDING  Incomplete  Fungus Culture with Smear     Status: None (Preliminary result)   Collection Time: 12/30/13 11:20 AM  Result Value Ref Range Status   Specimen Description TISSUE OTHER  Final   Special Requests B) RIGHT ATRIUM W/ VEGETATION  Final   Fungal Smear   Final    NO YEAST OR FUNGAL ELEMENTS SEEN Performed at [facility]    Culture   Final    CULTURE IN PROGRESS FOR FOUR WEEKS Performed at [facility]    Report Status PENDING  Incomplete  Gram stain     Status: None   Collection Time: 12/30/13 11:20 AM  Result Value Ref Range Status   Specimen Description TISSUE OTHER  Final   Special Requests B) RIGHT ATRIUM W/ VEGETATION  Final   Gram Stain   Final    MODERATE WBC PRESENT,BOTH PMN AND MONONUCLEAR RARE GRAM POSITIVE COCCI IN PAIRS    Report Status 12/30/2013 FINAL  Final    Medical History: Past  Medical History  Diagnosis Date  . Obesity   . Smokeless tobacco use   . Fracture of left femur   . Meningitis     Group B Strep (November 2015)  . Patent foramen ovale     Closed during procedure on December 30, 2013  . History of open heart surgery     December 30, 2013- ascending aortic root replacement, TEE  . Endocarditis     related to meningitis     Assessment: 33 YOM with Group B Strep native aortic valve endocarditis complicated by sepsis, meningitis, and cerebral emboli/brain abscesses. The patient is now s/p ascending aortic root replacement, repair of septal leaflet triscuspid valve, and closure of PFO on 11/18. The patient was noted to have AKI, now resolved and pharmacy consulted to add Gentamicin to Rocephin for synergy for a planned duration of 14 days. Will utilize an adjusted weight for Gentamicin dosing - adj wt: 77 kg, SCr 1.03, CrCl~100 ml/min. Gent ke~0.307, Vd~20.8L >> will start traditional dosing of Gentamicin and monitor renal function closely.   Goal of Therapy:  Gentamicin peak 3-4 mcg/ml, trough of <2 mcg/ml  Plan:  1. Start Gentamicin 80 mg IV every 8 hours 2. Will continue to follow renal function, and peak/trough if/when needed  Georgina Pillion, PharmD, BCPS Clinical Pharmacist Pager: [phone] 01/01/2014 10:11 AM

## 2014-01-01 NOTE — Progress Notes (Signed)
Rt changed arterial line without any complications

## 2014-01-01 NOTE — Progress Notes (Signed)
Patient self extubated and placed on 6L [place]. O2 stats in the high 90s. TCTS and CCM notified. Nurse will continue to monitor.

## 2014-01-01 NOTE — Consult Note (Signed)
ELECTROPHYSIOLOGY CONSULT NOTE  Patient ID: [name], MRN: [id number], DOB/AGE: 1985/06/20 28 y.o. Admit date: 12/23/2013 Date of Consult: 01/01/2014  Primary Physician: No primary care provider on file. Primary Cardiologist: [facility]  Chief Complaint: Complete heart block      HPI [name] is a 28 y.o. male  Who is recently engaged and works as a Nurse, adult at [employer] who presented 11/11 with a diagnosis of meningitis and turned out to be related to group B strep. Upon further evaluation he was found to have aortic valve and tricuspid valve endocarditis as well as an aortic root/RA fistula. He became acutely unstable with severe AI 11/18 and went for emergent surgery including ascending aortic root replacement with homograft, closure of the fistula and the patent foramen ovale, repair of his tricuspid valve leaflet.  It turned out he had a bicuspid aortic valve  He is currently intubated responsive  He is in complete heart block  His early hospital course was also complicated by profound thrombocytopenia which has normalized.   Past Medical History  Diagnosis Date  . Obesity   . Smokeless tobacco use   . Fracture of left femur   . Meningitis     Group B Strep (November 2015)  . Patent foramen ovale     Closed during procedure on December 30, 2013  . History of open heart surgery     December 30, 2013- ascending aortic root replacement, TEE  . Endocarditis     related to meningitis       Surgical History:  Past Surgical History  Procedure Laterality Date  . Ascending aortic root replacement N/A 12/30/2013    Procedure: ASCENDING AORTIC ROOT REPLACEMENT with 78mm HOMOGRAFT, CLOSURE OF AORTIC TO RIGHT ATRIAL FISTULA, CLOSURE OF PATENT FORAMEN OVALE;  Surgeon: Delight Ovens, MD;  Location: [facility] OR;  Service: Open Heart Surgery;  Laterality: N/A;  . Tricuspid valve replacement N/A 12/30/2013    Procedure: REPAIR OF TRICUSPID VALVE LEAFLET;  Surgeon: Delight Ovens, MD;   Location: [facility] OR;  Service: Open Heart Surgery;  Laterality: N/A;  . Intraoperative transesophageal echocardiogram N/A 12/30/2013    Procedure: INTRAOPERATIVE TRANSESOPHAGEAL ECHOCARDIOGRAM;  Surgeon: Delight Ovens, MD;  Location: [facility] OR;  Service: Open Heart Surgery;  Laterality: N/A;     Home Meds: Prior to Admission medications   Medication Sig Start Date End Date Taking? Authorizing Provider  HYDROcodone-acetaminophen (NORCO/VICODIN) 5-325 MG per tablet Take 2 tablets by mouth every 4 (four) hours as needed. As needed for pain 12/20/13  Yes Historical Provider, MD  ondansetron (ZOFRAN-ODT) 4 MG disintegrating tablet Take 4 mg by mouth every 6 (six) hours. 12/21/13  Yes Historical Provider, MD  prochlorperazine (COMPAZINE) 10 MG tablet Take 1 tablet by mouth 3 (three) times daily. 12/20/13  Yes Historical Provider, MD  TAMIFLU 75 MG capsule Take 75 mg by mouth 2 (two) times daily. 12/20/13  Yes Historical Provider, MD    Inpatient Medications:  . acetaminophen  1,000 mg Oral 4 times per day   Or  . acetaminophen (TYLENOL) oral liquid 160 mg/5 mL  1,000 mg Per Tube 4 times per day  . antiseptic oral rinse  7 mL Mouth Rinse QID  . bisacodyl  10 mg Oral Daily   Or  . bisacodyl  10 mg Rectal Daily  . cefTRIAXone (ROCEPHIN)  IV  2 g Intravenous Q12H  . chlorhexidine  15 mL Mouth Rinse BID  . docusate sodium  200 mg Oral Daily  .  gentamicin  80 mg Intravenous Q8H  . insulin aspart  0-24 Units Subcutaneous 6 times per day  . pantoprazole (PROTONIX) IV  40 mg Intravenous Q24H  . sodium chloride  3 mL Intravenous Q12H     Allergies: No Known Allergies  History   Social History  . Marital Status: Married    Spouse Name: N/A    Number of Children: N/A  . Years of Education: N/A   Occupational History  . Not on file.   Social History Main Topics  . Smoking status: Never Smoker   . Smokeless tobacco: Current User  . Alcohol Use: Yes  . Drug Use: No  . Sexual Activity: Not on  file   Other Topics Concern  . Not on file   Social History Narrative     No family history on file.   ROS:  Please see the history of present illness.     All other systems reviewed and negative.    Physical Exam: Blood pressure 88/47, pulse 91, temperature 99.5 F (37.5 C), temperature source Core (Comment), resp. rate 17, height 5\' 5"  (1.651 m), weight 99.247 kg (218 lb 12.8 oz), SpO2 97 %. General: Well developed, well nourished male intubated but  Responsive to ward and physical stimulation Head: Normocephalic, atraumatic, sclera non-icteric, no xanthomas, nares are without discharge. EENT: normal Lymph Nodes:  none Back: without scoliosis/kyphosis, no CVA tendersness Neck: Negative for carotid bruits. JVD not elevated. Lungs: Clear bilaterally to auscultation without wheezes, rales, or rhonchi. Breathing is unlabored. Heart: RRR with S1 S2.  2/6 systolic murmur , rubs, or gallops appreciated. Abdomen: Soft, non-tender, non-distended with normoactive bowel sounds. No hepatomegaly. No rebound/guarding. No obvious abdominal masses. Msk:  Strength and tone appear normal for age. Extremities: No clubbing or cyanosis. No edema.  Distal pedal pulses are 2+ and equal bilaterally. Skin: Warm and Dry Neuro: Alert and oriented X 3. CN III-XII intact Grossly normal sensory and motor function . Psych:  Responds to questions appropriately with a normal affect.      Labs: Cardiac Enzymes No results for input(s): CKTOTAL, CKMB, TROPONINI in the last 72 hours. CBC Lab Results  Component Value Date   WBC 18.7* 01/01/2014   HGB 10.0* 01/01/2014   HCT 30.0* 01/01/2014   MCV 86.7 01/01/2014   PLT 152 01/01/2014   PROTIME:  Recent Labs  12/29/13 2200 12/30/13 1650 01/01/14 0430  LABPROT 21.4* 29.4* 19.0*  INR 1.84* 2.76* 1.58*   Chemistry  Recent Labs Lab 01/01/14 0430  NA 140  K 4.0  CL 107  CO2 23  BUN 27*  CREATININE 1.03  CALCIUM 7.5*  PROT 4.5*  BILITOT 1.5*    ALKPHOS 72  ALT 282*  AST 178*  GLUCOSE 123*   Lipids Lab Results  Component Value Date   TRIG 119 12/29/2013   BNP No results found for: PROBNP Miscellaneous Lab Results  Component Value Date   DDIMER >20.00* 12/23/2013    Radiology/Studies:  Ct Head Wo Contrast  12/23/2013   CLINICAL DATA:  5 day history of bilateral temporal region headaches with neck stiffness and vomiting  EXAM: CT HEAD WITHOUT CONTRAST  TECHNIQUE: Contiguous axial images were obtained from the base of the skull through the vertex without intravenous contrast.  COMPARISON:  None.  FINDINGS: The ventricles are normal in size and configuration. There is no mass, hemorrhage, extra-axial fluid collection, or midline shift. The gray-white compartments are normal. No appreciable acute infarct. The bony calvarium appears intact. The  mastoid air cells are clear. There is paranasal sinus disease in the left frontal sinus region.  IMPRESSION: Left frontal sinusitis. No intracranial lesion. No intracranial mass, hemorrhage, or extra-axial fluid. No gray old -white compartment lesions appreciated.   Electronically Signed   By: Bretta Bang M.D.   On: 12/23/2013 11:52   Mr Laqueta Jean ZO Contrast  12/24/2013   CLINICAL DATA:  Headache with fever and neck stiffness. Group B Streptococcus grown from cerebrospinal fluid. Elevated CSF white count. Initial encounter.  EXAM: MRI HEAD WITHOUT AND WITH CONTRAST  TECHNIQUE: Multiplanar, multiecho pulse sequences of the brain and surrounding structures were obtained without and with intravenous contrast.  CONTRAST:  20mL MULTIHANCE GADOBENATE DIMEGLUMINE 529 MG/ML IV SOLN  COMPARISON:  12/23/2013 CT head is negative  FINDINGS: The study is significantly motion degraded but overall diagnostic.  There are numerous 2-3 mm size areas of restricted diffusion predominantly at the gray-white junction of the cerebral hemispheres, at least one in the LEFT cerebellum, associated with peripheral  collections of fluid or pus having restricted diffusion in the subarachnoid spaces. Suspected bifrontal interhemispheric cortical edema of a fairly symmetric nature as seen on axial T2 and FLAIR images (image 15 series 5 and 6). Post infusion, definite foci of abnormal parenchymal enhancement difficult to confirm, due to patient motion. Suspected linear enhancement over the RIGHT frontal lobe. Findings are consistent with bacterial meningitis, and widespread microabscesses. Continued surveillance warranted.  Normal cerebral volume. No definite white matter disease. Flow voids are preserved. Tiny focus of chronic hemorrhage on susceptibility weighted imaging RIGHT caudate, nonacute.  No midline abnormality. Significant LEFT frontal sinus disease, with air-fluid level also noted in the LEFT maxillary sinus. Negative orbits. Negative mastoids.  IMPRESSION: Findings consistent with bacterial meningitis, cerebritis, and scattered microabscesses. Delineation of the extent of disease difficult due to patient motion.  A call is in to the ordering provider.   Electronically Signed   By: Davonna Belling M.D.   On: 12/24/2013 20:22   Dg Chest Port 1 View  01/01/2014   CLINICAL DATA:  Ventilated patient.  Endocarditis.  EXAM: PORTABLE CHEST - 1 VIEW  COMPARISON:  12/31/2013.  FINDINGS: Patchy bilateral airspace opacity is similar to the previous day's study allowing for differences in technique. Likely pulmonary edema. Additional lung base opacity is likely atelectasis, also unchanged.  Endotracheal tube, left internal jugular central venous line and nasogastric tube are stable well-positioned. There is a stable tube projecting at the left lung base.  Swan-Ganz catheter is stable. The tip projects in the right ventricle.  No pneumothorax.  IMPRESSION: 1. Stable appearance from the previous day's study. 2. Persistent patchy areas of lung opacity in a central distribution. Pulmonary edema is suspected. No new lung opacities.  Support apparatus is stable   Electronically Signed   By: Amie Portland M.D.   On: 01/01/2014 08:28   Dg Chest Port 1 View  12/31/2013   CLINICAL DATA:  27 year old male with endocarditis and CNS infection. Initial encounter.  EXAM: PORTABLE CHEST - 1 VIEW  COMPARISON:  12/30/2013 and earlier.  FINDINGS: Portable AP semi upright view at at 0559 hrs. Stable endotracheal tube tip just below the clavicles. Enteric tube courses to the abdomen as before. Right IJ approach catheter terminates in the RV apex region as before. Midline mediastinal or chest tube is stable. Stable left IJ central line.  Continued low lung volumes with patchy and confluent bilateral pulmonary opacity superimposed on dense lower lobe collapse or consolidation. No pneumothorax.  No large pleural effusion.  IMPRESSION: 1.  Stable lines and tubes. 2. Stable ventilation with widespread pulmonary opacity and dense lower lobe collapse or consolidation.   Electronically Signed   By: Augusto GambleLee  Hall M.D.   On: 12/31/2013 07:49   Dg Chest Port 1 View  12/30/2013   CLINICAL DATA:  Endocarditis of aortic valve.  EXAM: PORTABLE CHEST - 1 VIEW  COMPARISON:  December 29, 2013.  FINDINGS: Endotracheal tube is in grossly good position with distal tip 2 cm above the carina. Nasogastric tube is seen entering the stomach. Left internal jugular catheter is unchanged in position with distal tip in expected position the SVC. There is been interval placement of right internal jugular catheter with distal tip projected over the right ventricle. Stable bilateral perihilar opacities are noted concerning for edema or pneumonia.  IMPRESSION: Endotracheal and nasogastric tubes in grossly good position. Interval placement of right internal jugular catheter with distal tip projected over expected position of right ventricle. Stable bilateral perihilar opacities are noted.   Electronically Signed   By: Roque LiasJames  Green M.D.   On: 12/30/2013 17:54   Dg Chest Port 1  View  12/29/2013   CLINICAL DATA:  Central line placement. Nasogastric tube placement. Meningitis.  EXAM: PORTABLE CHEST - 1 VIEW  COMPARISON:  12/29/2013 at 4:50 p.m.  FINDINGS: Endotracheal tube tip 2.1 cm above the carina. Left IJ line tip: SVC. Nasogastric tube extends into the stomach view beyond the inferior margin of today' s radiograph.  Stable bilateral airspace opacities, low lung volumes, and cardiomegaly.  IMPRESSION: 1. The nasogastric tube has been repositioned and now extends down into the stomach and beyond the inferior margin of today's image compatible with satisfactory positioning.   Electronically Signed   By: Herbie BaltimoreWalt  Liebkemann M.D.   On: 12/29/2013 18:47   Dg Chest Port 1 View  12/29/2013   CLINICAL DATA:  Nasogastric tube placement. ET tube placement. Acute Respiratory failure.  EXAM: PORTABLE ABDOMEN - 1 VIEW; PORTABLE CHEST - 1 VIEW  COMPARISON:  12/29/2013  FINDINGS: Chest x-ray:  The endotracheal tube is an good position, 2.8 cm above the carina. The NG tube is coiled back on itself in the mid esophagus and should be retracted and replaced. Persistent cardiac enlargement and diffuse airspace process in the lungs with bilateral effusions.  Abdomen:  The endotracheal tube is coiled in the mid esophagus and should be removed and replaced. The abdominal bowel gas pattern is unremarkable.  IMPRESSION: The endotracheal tube is in good position.  The NG tube is coiled in the upper esophagus.  Persistent diffuse airspace process.  Normal abdominal radiograph.   Electronically Signed   By: Loralie ChampagneMark  Gallerani M.D.   On: 12/29/2013 17:17   Dg Chest Port 1 View  12/29/2013   CLINICAL DATA:  Intubated, check endotracheal tube position  EXAM: PORTABLE CHEST - 1 VIEW  COMPARISON:  Prior chest x-ray earlier today at 10:48 a.m.  FINDINGS: Interval intubation. The endotracheal tube tip is 1.5 cm above the carina.  Stable cardiomegaly. Diffuse bilateral interstitial and airspace opacities. Developing  right pleural effusion appears more evident than on seen on the radiographs from earlier today.  IMPRESSION: 1. The tip of the endotracheal tube is 1.5 cm above the carina. 2. Persistent diffuse bilateral interstitial and airspace opacities concerning for pulmonary edema. Massive aspiration, multifocal pneumonia or alveolar pulmonary hemorrhage could appear similar in the appropriate clinical scenarios. 3. Developing right pleural effusion.   Electronically Signed   By: Vilma PraderHeath  Archer Asa M.D.   On: 12/29/2013 15:37   Dg Chest Port 1 View  12/29/2013   CLINICAL DATA:  Tachypnea, hypoxia, shortness of breath.  EXAM: PORTABLE CHEST - 1 VIEW  COMPARISON:  12/28/2013.  FINDINGS: Trachea is midline. Heart size is grossly stable. There is diffuse bilateral airspace disease, new or progressive from 12/28/2013. No definite pleural fluid.  IMPRESSION: New or progressive diffuse bilateral airspace disease is indicative of pulmonary edema. Difficult to exclude aspiration.   Electronically Signed   By: Leanna Battles M.D.   On: 12/29/2013 12:26   Dg Chest Port 1 View  12/28/2013   CLINICAL DATA:  Shortness of breath.  EXAM: PORTABLE CHEST - 1 VIEW  COMPARISON:  None.  FINDINGS: Shallow inspiration. The heart size and mediastinal contours are within normal limits. Both lungs are clear. The visualized skeletal structures are unremarkable.  IMPRESSION: No active disease.   Electronically Signed   By: Burman Nieves M.D.   On: 12/28/2013 01:45   Dg Abd Portable 1v  12/29/2013   CLINICAL DATA:  Nasogastric tube placement  EXAM: PORTABLE ABDOMEN - 1 VIEW  COMPARISON:  Portable exam 1812 hr compared to 12/29/2013  FINDINGS: Nasogastric tube tip projects over distal gastric antrum.  Nonobstructive bowel gas pattern.  Bones unremarkable.  IMPRESSION: Tip of nasogastric tube projects over distal gastric antrum.   Electronically Signed   By: Ulyses Southward M.D.   On: 12/29/2013 18:38   Dg Abd Portable 1v  12/29/2013    CLINICAL DATA:  Nasogastric tube placement. ET tube placement. Acute Respiratory failure.  EXAM: PORTABLE ABDOMEN - 1 VIEW; PORTABLE CHEST - 1 VIEW  COMPARISON:  12/29/2013  FINDINGS: Chest x-ray:  The endotracheal tube is an good position, 2.8 cm above the carina. The NG tube is coiled back on itself in the mid esophagus and should be retracted and replaced. Persistent cardiac enlargement and diffuse airspace process in the lungs with bilateral effusions.  Abdomen:  The endotracheal tube is coiled in the mid esophagus and should be removed and replaced. The abdominal bowel gas pattern is unremarkable.  IMPRESSION: The endotracheal tube is in good position.  The NG tube is coiled in the upper esophagus.  Persistent diffuse airspace process.  Normal abdominal radiograph.   Electronically Signed   By: Loralie Champagne M.D.   On: 12/29/2013 17:17    EKG: P synchronous pacing   Assessment and Plan:   Complete heart block  Aortic valve/tricuspid valve endocarditis requiring surgical repair and replacement of both valves  Meningitis complicating endocarditis  Currently ventilator dependent  Previously normal LV function  The patient has complete heart blocks persisting now 48 hours post surgical repair of endocarditis involving the aortic and the tricuspid valve. It is possible that he will recover conduction; however, I suspect strongly suspect that he will have persistent complete heart block and will need pacing.  I have reviewed this with his fiance and her mother. We will follow along and anticipate pacemaker implantation next week      Sherryl Manges  .skc

## 2014-01-01 NOTE — Progress Notes (Signed)
[facility]    Date of Admission:  12/23/2013   Total days of antibiotics 10        Day 10 ceftriaxone                   ID: [name] is a 28 y.o. male with  Group b strep sepsis 2/2 native AV endocarditis c/b meningitis and brain abscesses POD#1 S/P aortic root replacement, AVR, TV repair, and repair of Aorto-R Atrial fistula, and found to have  3rd degree AV block 2/2  infection and surgical repair. Active Problems:   Thrombocytopenia   Meningitis due to bacterium   Acute renal failure syndrome   Dehydration   Sepsis due to group B Streptococcus   LFT elevation   Meningitis, streptococcal   Headache   Pain in joint, lower leg   Brain abscess   Acute respiratory failure with hypoxia   Acute on chronic respiratory failure with hypoxia   HCAP (healthcare-associated pneumonia)   Acute systolic congestive heart failure   Meningitis   Bacterial endocarditis   Bilateral edema of lower extremity   Severe aortic insufficiency   Acute respiratory failure   Third degree heart block   Endocarditis of aortic valve    Subjective: Recently self extubated, sleepy but opens eyes and briefly follows command. Fever curve trending down but was on tylenol.chest tube removed this morning  Cr down to baseline, and leukocytosis improved   Medications:  . acetaminophen  1,000 mg Oral 4 times per day   Or  . acetaminophen (TYLENOL) oral liquid 160 mg/5 mL  1,000 mg Per Tube 4 times per day  . antiseptic oral rinse  7 mL Mouth Rinse QID  . bisacodyl  10 mg Oral Daily   Or  . bisacodyl  10 mg Rectal Daily  . cefTRIAXone (ROCEPHIN)  IV  2 g Intravenous Q12H  . chlorhexidine  15 mL Mouth Rinse BID  . docusate sodium  200 mg Oral Daily  . insulin aspart  0-24 Units Subcutaneous 6 times per day  . pantoprazole (PROTONIX) IV  40 mg Intravenous Q24H  . sodium chloride  3 mL Intravenous Q12H    Objective: Vital signs in last 24 hours: Temp:  [96.8 F (36  C)-100.9 F (38.3 C)] 97.3 F (36.3 C) (11/20 0900) Pulse Rate:  [80-182] 91 (11/20 0900) Resp:  [14-24] 14 (11/20 0900) BP: (87-131)/(45-76) 108/70 mmHg (11/20 0900) SpO2:  [93 %-98 %] 98 % (11/20 0900) Arterial Line BP: (96-130)/(48-76) 118/72 mmHg (11/20 0900) FiO2 (%):  [40 %] 40 % (11/20 0800) Weight:  [218 lb 12.8 oz (99.247 kg)] 218 lb 12.8 oz (99.247 kg) (11/20 0400) Physical Exam  Constitutional: He is smilling after extubation. He appears well-developed and well-nourished. No distress.  HENT: PERRLA, EOMI, no scleral icterus Mouth/Throat: dry oral mucosa pulm = Chest bilateral rhonchi Chest= midline dressing in place Cv: regular,3 /6 SEM heard throughout Abd= decrease bowel sounds, soft Ext: No edema Skin = no rash   Lab Results  Recent Labs  12/31/13 0400 12/31/13 1730 12/31/13 1734 01/01/14 0430  WBC 28.8* 21.3*  --  18.7*  HGB 12.5* 10.5* 10.5* 10.0*  HCT 37.3* 31.4* 31.0* 30.0*  NA 139  --  138 140  K 5.0  --  4.0 4.0  CL 107  --  103 107  CO2 19  --   --  23  BUN 32*  --  26* 27*  CREATININE 1.57* 1.23  1.40* 1.03   Liver Panel  Recent Labs  12/29/13 2200 01/01/14 0430  PROT 4.9* 4.5*  ALBUMIN 1.8* 1.6*  AST 293* 178*  ALT 262* 282*  ALKPHOS 63 72  BILITOT 1.1 1.5*    Microbiology: 11/18 tissue cx showing gpc in prs on gram stain. No growth at 72hr  Studies/Results: Dg Chest Port 1 View  01/01/2014   CLINICAL DATA:  Ventilated patient.  Endocarditis.  EXAM: PORTABLE CHEST - 1 VIEW  COMPARISON:  12/31/2013.  FINDINGS: Patchy bilateral airspace opacity is similar to the previous day's study allowing for differences in technique. Likely pulmonary edema. Additional lung base opacity is likely atelectasis, also unchanged.  Endotracheal tube, left internal jugular central venous line and nasogastric tube are stable well-positioned. There is a stable tube projecting at the left lung base.  Swan-Ganz catheter is stable. The tip projects in the right  ventricle.  No pneumothorax.  IMPRESSION: 1. Stable appearance from the previous day's study. 2. Persistent patchy areas of lung opacity in a central distribution. Pulmonary edema is suspected. No new lung opacities. Support apparatus is stable   Electronically Signed   By: Amie Portland M.D.   On: 01/01/2014 08:28   Dg Chest Port 1 View  12/31/2013   CLINICAL DATA:  28 year old male with endocarditis and CNS infection. Initial encounter.  EXAM: PORTABLE CHEST - 1 VIEW  COMPARISON:  12/30/2013 and earlier.  FINDINGS: Portable AP semi upright view at at 0559 hrs. Stable endotracheal tube tip just below the clavicles. Enteric tube courses to the abdomen as before. Right IJ approach catheter terminates in the RV apex region as before. Midline mediastinal or chest tube is stable. Stable left IJ central line.  Continued low lung volumes with patchy and confluent bilateral pulmonary opacity superimposed on dense lower lobe collapse or consolidation. No pneumothorax. No large pleural effusion.  IMPRESSION: 1.  Stable lines and tubes. 2. Stable ventilation with widespread pulmonary opacity and dense lower lobe collapse or consolidation.   Electronically Signed   By: Augusto Gamble M.D.   On: 12/31/2013 07:49   Dg Chest Port 1 View  12/30/2013   CLINICAL DATA:  Endocarditis of aortic valve.  EXAM: PORTABLE CHEST - 1 VIEW  COMPARISON:  December 29, 2013.  FINDINGS: Endotracheal tube is in grossly good position with distal tip 2 cm above the carina. Nasogastric tube is seen entering the stomach. Left internal jugular catheter is unchanged in position with distal tip in expected position the SVC. There is been interval placement of right internal jugular catheter with distal tip projected over the right ventricle. Stable bilateral perihilar opacities are noted concerning for edema or pneumonia.  IMPRESSION: Endotracheal and nasogastric tubes in grossly good position. Interval placement of right internal jugular catheter with  distal tip projected over expected position of right ventricle. Stable bilateral perihilar opacities are noted.   Electronically Signed   By: Roque Lias M.D.   On: 12/30/2013 17:54     Assessment/Plan: Severe infective endocarditis with group b strep pod #1  2/2 native AV endocarditis c/b meningitis and brain abscesses POD#1 S/P aortic root replacement, AVR, TV repair, and repair of Aorto-R Atrial fistula, and found to have  3rd degree AV block 2/2  infection and surgical repair.  - will narrow antibiotics to ceftriaxone 2gm IV q 12hr.  will start gentamicin today with goal of doing 14 days if Cr can tolerate - fever improving, recommend to d/c scheduled tylenol and change to PRN to see  that his fever is trending down without antipyretics - will follow fever curve, if it still persists may need to do imaging to look for metastatic disease - asked path to send valve to 16S DNA testing to identify group b strep from valvular tissue - gbs bacterial meningitis/brain abscess covered with ceftriaxone 2gm iv q 12  Zaven Klemens, [facility] Cell: [phone] Pager: [phone]  01/01/2014, 9:24 AM

## 2014-01-01 NOTE — Procedures (Signed)
Extubation Procedure Note  Patient Details:   Name: [name] DOB: 03-31-1985 MRN: [id number]   Airway Documentation:  Airway (Active)    Evaluation  O2 sats: stable throughout Complications: No apparent complications Patient did tolerate procedure well. Bilateral Breath Sounds: Rhonchi Suctioning: Airway Yes   Patient self extubated and placed on a 6L nasal cannula.  Sats currently 98%.  Patient was able to talk post extubation.  No evidence of stridor noted.  MD made aware.  RT will continue to monitor.   Durwin Glaze 01/01/2014, 3:34 PM

## 2014-01-02 ENCOUNTER — Inpatient Hospital Stay (HOSPITAL_COMMUNITY): Payer: BC Managed Care – PPO

## 2014-01-02 DIAGNOSIS — A419 Sepsis, unspecified organism: Secondary | ICD-10-CM

## 2014-01-02 DIAGNOSIS — R0902 Hypoxemia: Secondary | ICD-10-CM

## 2014-01-02 LAB — TYPE AND SCREEN
ABO/RH(D): A NEG
Antibody Screen: NEGATIVE
Unit division: 0
Unit division: 0
Unit division: 0
Unit division: 0
Unit division: 0
Unit division: 0

## 2014-01-02 LAB — BLOOD GAS, ARTERIAL
Acid-base deficit: 0.3 mmol/L (ref 0.0–2.0)
Bicarbonate: 22.9 mEq/L (ref 20.0–24.0)
Drawn by: 418751
FIO2: 0.36 %
O2 Saturation: 96.6 %
Patient temperature: 99.7
TCO2: 23.9 mmol/L (ref 0–100)
pCO2 arterial: 32.6 mmHg — ABNORMAL LOW (ref 35.0–45.0)
pH, Arterial: 7.464 — ABNORMAL HIGH (ref 7.350–7.450)
pO2, Arterial: 87.1 mmHg (ref 80.0–100.0)

## 2014-01-02 LAB — GLUCOSE, CAPILLARY
GLUCOSE-CAPILLARY: 90 mg/dL (ref 70–99)
GLUCOSE-CAPILLARY: 131 mg/dL — AB (ref 70–99)
GLUCOSE-CAPILLARY: 121 mg/dL — AB (ref 70–99)
GLUCOSE-CAPILLARY: 103 mg/dL — AB (ref 70–99)
Glucose-Capillary: 79 mg/dL (ref 70–99)
Glucose-Capillary: 93 mg/dL (ref 70–99)

## 2014-01-02 LAB — COMPREHENSIVE METABOLIC PANEL
ALT: 285 U/L — ABNORMAL HIGH (ref 0–53)
AST: 177 U/L — ABNORMAL HIGH (ref 0–37)
Albumin: 1.7 g/dL — ABNORMAL LOW (ref 3.5–5.2)
Alkaline Phosphatase: 72 U/L (ref 39–117)
Anion gap: 17 — ABNORMAL HIGH (ref 5–15)
BUN: 23 mg/dL (ref 6–23)
CO2: 22 mEq/L (ref 19–32)
Calcium: 7.7 mg/dL — ABNORMAL LOW (ref 8.4–10.5)
Chloride: 105 mEq/L (ref 96–112)
Creatinine, Ser: 1.03 mg/dL (ref 0.50–1.35)
GFR calc Af Amer: >90 mL/min (ref >90)
GFR calc non Af Amer: >90 mL/min (ref >90)
Glucose, Bld: 91 mg/dL (ref 70–99)
Potassium: 4 mEq/L (ref 3.7–5.3)
Sodium: 144 mEq/L (ref 137–147)
Total Bilirubin: 1.4 mg/dL — ABNORMAL HIGH (ref 0.3–1.2)
Total Protein: 4.9 g/dL — ABNORMAL LOW (ref 6.0–8.3)

## 2014-01-02 LAB — CBC
HCT: 27.7 % — ABNORMAL LOW (ref 39.0–52.0)
Hemoglobin: 9.2 g/dL — ABNORMAL LOW (ref 13.0–17.0)
MCH: 29.3 pg (ref 26.0–34.0)
MCHC: 33.2 g/dL (ref 30.0–36.0)
MCV: 88.2 fL (ref 78.0–100.0)
Platelets: 161 10*3/uL (ref 150–400)
RBC: 3.14 MIL/uL — ABNORMAL LOW (ref 4.22–5.81)
RDW: 15.4 % (ref 11.5–15.5)
WBC: 17 10*3/uL — ABNORMAL HIGH (ref 4.0–10.5)

## 2014-01-02 LAB — POCT I-STAT, CHEM 8
BUN: 12 mg/dL (ref 6–23)
CREATININE: 0.8 mg/dL (ref 0.50–1.35)
Calcium, Ion: 1.11 mmol/L — ABNORMAL LOW (ref 1.12–1.23)
Chloride: 97 mEq/L (ref 96–112)
Glucose, Bld: 121 mg/dL — ABNORMAL HIGH (ref 70–99)
HEMATOCRIT: 27 % — AB (ref 39.0–52.0)
HEMOGLOBIN: 9.2 g/dL — AB (ref 13.0–17.0)
POTASSIUM: 3.5 meq/L — AB (ref 3.7–5.3)
Sodium: 137 mEq/L (ref 137–147)
TCO2: 25 mmol/L (ref 0–100)

## 2014-01-02 LAB — PROTIME-INR
INR: 1.42 (ref 0.00–1.49)
Prothrombin Time: 17.5 seconds — ABNORMAL HIGH (ref 11.6–15.2)

## 2014-01-02 MED ORDER — POTASSIUM CHLORIDE 10 MEQ/50ML IV SOLN
INTRAVENOUS | Status: AC
  Administered 2014-01-02: 10 meq via INTRAVENOUS
  Filled 2014-01-02: qty 150

## 2014-01-02 MED ORDER — FUROSEMIDE 10 MG/ML IJ SOLN
20.0000 mg | Freq: Three times a day (TID) | INTRAMUSCULAR | Status: DC
  Administered 2014-01-02 – 2014-01-05 (×10): 20 mg via INTRAVENOUS
  Filled 2014-01-02 (×12): qty 2

## 2014-01-02 MED ORDER — POTASSIUM CHLORIDE 10 MEQ/50ML IV SOLN
10.0000 meq | INTRAVENOUS | Status: AC | PRN
  Administered 2014-01-02 – 2014-01-03 (×3): 10 meq via INTRAVENOUS

## 2014-01-02 NOTE — Evaluation (Signed)
Clinical/Bedside Swallow Evaluation Patient Details  Name: [name] MRN: [id number] Date of Birth: 02/28/1985  Today's Date: 01/02/2014 Time: [id number] SLP Time Calculation (min) (ACUTE ONLY): 25 min  Past Medical History:  Past Medical History  Diagnosis Date  . Obesity   . Smokeless tobacco use   . Fracture of left femur   . Meningitis     Group B Strep (November 2015)  . Patent foramen ovale     Closed during procedure on December 30, 2013  . History of open heart surgery     December 30, 2013- ascending aortic root replacement, TEE  . Endocarditis     related to meningitis    Past Surgical History:  Past Surgical History  Procedure Laterality Date  . Ascending aortic root replacement N/A 12/30/2013    Procedure: ASCENDING AORTIC ROOT REPLACEMENT with 23mm HOMOGRAFT, CLOSURE OF AORTIC TO RIGHT ATRIAL FISTULA, CLOSURE OF PATENT FORAMEN OVALE;  Surgeon: Delight OvensEdward B Gerhardt, MD;  Location: [facility] OR;  Service: Open Heart Surgery;  Laterality: N/A;  . Tricuspid valve replacement N/A 12/30/2013    Procedure: REPAIR OF TRICUSPID VALVE LEAFLET;  Surgeon: Delight OvensEdward B Gerhardt, MD;  [facility];  Service: Open Heart Surgery;  Laterality: N/A;  . Intraoperative transesophageal echocardiogram N/A 12/30/2013    Procedure: INTRAOPERATIVE TRANSESOPHAGEAL ECHOCARDIOGRAM;  Surgeon: Delight OvensEdward B Gerhardt, MD;  [facility];  Service: Open Heart Surgery;  Laterality: N/A;   HPI:  28 yo man, admitted 11/11 with meningitis c/b transaminitis and DIC. LP showed Group B strep and MRI brain showed embolic process. 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 Developed progressive dyspnea and respiratory distress 11/17 with evolving (new) B infiltrates on CXR. Intubated. 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 To OR for emergency repair on 11/18. Head CT 12/23/13 >> L frontal sinusitis, no acute findings.  MRI brain 11/12 >> bacterial meningitis, cerebritis,  scattered microabscesses .  Acute resp failure in setting of bilateral pulm infiltrates; intubated 11/17-11/20 (self-extubated.)     Assessment / Plan / Recommendation Clinical Impression  Pt presents with a mild acute reversible dysphagia marked primarily by swallow/respiratory incoordination when RR exceeds 30.  Phonation is clear with good intensity, suggesting integrity of vocal fold function s/p self-extubation.  Pt is safe to begin a regular consistency diet with thin liquids.  He is mildly impulsive with inconsistent ability to follow-through with instructions.  Rec full supervision with meals next 48 hours to ensure safe PO toleration; take frequent breaks with meals; small bites/small sips.  SLP will follow briefly to ensure safety.        Aspiration Risk  Mild    Diet Recommendation Regular;Thin liquid   Liquid Administration via: Cup;Straw Medication Administration: Whole meds with puree Supervision: Patient able to self feed;Full supervision/cueing for compensatory strategies Compensations: Slow rate;Small sips/bites    Other  Recommendations Oral Care Recommendations: Oral care BID   Follow Up Recommendations  None    Frequency and Duration min 2x/week  1 week     Swallow Study Prior Functional Status       General Date of Onset: 12/23/13 HPI: 28 yo man, admitted 11/11 with meningitis c/b transaminitis and DIC. LP showed Group B strep and MRI brain showed embolic process. 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 Developed progressive dyspnea and respiratory distress 11/17 with evolving (new) B infiltrates on CXR. Intubated. 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 To  OR for emergency repair on 11/18. Head CT 12/23/13 >> L frontal sinusitis, no acute findings.  MRI brain 11/12 >> bacterial meningitis, cerebritis, scattered microabscesses .  Acute resp failure in setting of bilateral pulm infiltrates; intubated 11/17-11/20  (self-extubated.)   Type of Study: Bedside swallow evaluation Previous Swallow Assessment: no Diet Prior to this Study: NPO Temperature Spikes Noted: No Respiratory Status: Nasal cannula History of Recent Intubation: Yes Length of Intubations (days): 3 days Date extubated: 01/01/14 Behavior/Cognition: Alert;Cooperative Oral Cavity - Dentition: Adequate natural dentition Self-Feeding Abilities: Able to feed self Patient Positioning: Upright in bed Baseline Vocal Quality: Clear Volitional Cough: Weak Volitional Swallow: Able to elicit    Oral/Motor/Sensory Function Overall Oral Motor/Sensory Function: Appears within functional limits for tasks assessed   Ice Chips Ice chips: Within functional limits Presentation: Self Fed   Thin Liquid Thin Liquid: Impaired Presentation: Cup Pharyngeal  Phase Impairments: Cough - Immediate    Nectar Thick Nectar Thick Liquid: Not tested   Honey Thick Honey Thick Liquid: Not tested   Puree Puree: Within functional limits Presentation: Self Fed;Spoon   Solid  [name], [place] CCC/SLP Pager [phone]     Solid: Within functional limits Presentation: Self Fed;Spoon       Blenda Mounts Laurice 01/02/2014,10:07 AM

## 2014-01-02 NOTE — Progress Notes (Signed)
PULMONARY / CRITICAL CARE MEDICINE   Name: Albert CousinHayden Drohan MRN: [id number] DOB: 10/19/1985    ADMISSION DATE:  12/23/2013 CONSULTATION DATE:  12/29/13  REFERRING MD :  Dr Sharon SellerMcClung, [facility]  Reason for consultation:  Respiratory distress, B pulmonary infiltrates  INITIAL PRESENTATION:  28 yo man, admitted 11/11 with meningitis c/b transaminitis and DIC.  LP showed Group B strep and MRI brain showed embolic process. 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 Developed progressive dyspnea and respiratory distress 11/17 with evolving (new) B infiltrates on CXR. Intubated. 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 To OR for emergency repair on 11/18.   STUDIES:  Head CT 12/23/13 >> L frontal sinusitis, no acute findings MRI brain 11/12 >> bacterial meningitis, cerebritis, scattered microabscesses  CXR 11/16 >> no infiltrates B LE doppler US 11/16 >> no LE DVT TEE 11/18 >> Apparent TV vegetation, AV vegetation with severe AI and apparent fistula to the RA w shunt  SIGNIFICANT EVENTS: S/p AVR and proximal aortic root repair 11/18 (Dr Tyrone SageGerhardt)  SUBJECTIVE:  Significant improvement MS PEEP 8, FiO2 down to 0.40  VITAL SIGNS: Temp:  [98.1 F (36.7 C)-100.4 F (38 C)] 99.7 F (37.6 C) (11/21 0800) Pulse Rate:  [89-110] 104 (11/21 0800) Resp:  [13-36] 33 (11/21 0800) BP: (88-115)/(44-69) 115/69 mmHg (11/21 0800) SpO2:  [96 %-100 %] 99 % (11/21 0800) Arterial Line BP: (103-138)/(42-73) 120/57 mmHg (11/21 0800) FiO2 (%):  [40 %] 40 % (11/20 1200) Weight:  [97.2 kg (214 lb 4.6 oz)] 97.2 kg (214 lb 4.6 oz) (11/21 0400) HEMODYNAMICS: PAP: (15-28)/(5-19) 22/6 mmHg CVP:  [7 mmHg-15 mmHg] 7 mmHg CO:  [7.4 L/min-7.6 L/min] 7.6 L/min CI:  [3.7 L/min/m2-3.8 L/min/m2] 3.8 L/min/m2 VENTILATOR SETTINGS: Vent Mode:  [-] PSV;CPAP FiO2 (%):  [40 %] 40 % PEEP:  [5 cmH20] 5 cmH20 Pressure Support:  [5 cmH20] 5 cmH20 INTAKE / OUTPUT:  Intake/Output Summary  (Last 24 hours) at 01/02/14 [id number] Last data filed at 01/02/14 0800  Gross per 24 hour  Intake 1082.07 ml  Output   2480 ml  Net -1397.93 ml    PHYSICAL EXAMINATION: General:  Young man extubated and doing well. Neuro: Alert and interactive. HEENT: [place]/AT, PERRL, EOM-I and MMM. Cardiovascular:  Tachycardic, regular, 3/6 systolic M heard throughout chest Lungs:  Bilateral inspiratory crackles all fields Abdomen:  Soft, NT, + BS Musculoskeletal:  No deformities, no edema Skin:  No rash  LABS:  CBC  Recent Labs Lab 12/31/13 1730 12/31/13 1734 01/01/14 0430 01/02/14 0430  WBC 21.3*  --  18.7* 17.0*  HGB 10.5* 10.5* 10.0* 9.2*  HCT 31.4* 31.0* 30.0* 27.7*  PLT 149*  --  152 161   Coag's  Recent Labs Lab 12/29/13 2200 12/30/13 1650 01/01/14 0430 01/02/14 0430  APTT 36 44*  --   --   INR 1.84* 2.76* 1.58* 1.42   BMET  Recent Labs Lab 12/31/13 0400  12/31/13 1734 01/01/14 0430 01/02/14 0430  NA 139  --  138 140 144  K 5.0  --  4.0 4.0 4.0  CL 107  --  103 107 105  CO2 19  --   --  23 22  BUN 32*  --  26* 27* 23  CREATININE 1.57*  < > 1.40* 1.03 1.03  GLUCOSE 103*  --  137* 123* 91  < > = values in this interval not displayed. Electrolytes  Recent Labs Lab 12/27/13 0436  12/30/13 2223 12/31/13 0400 12/31/13  1730 01/01/14 0430 01/02/14 0430  CALCIUM 7.6*  < >  --  7.3*  --  7.5* 7.7*  MG  --   --  2.4 2.2 2.1  --   --   PHOS 4.2  --   --   --   --   --   --   < > = values in this interval not displayed. Sepsis Markers No results for input(s): LATICACIDVEN, PROCALCITON, O2SATVEN in the last 168 hours. ABG  Recent Labs Lab 12/31/13 0417 12/31/13 1127 01/02/14 0453  PHART 7.347* 7.423 7.464*  PCO2ART 42.1 40.0 32.6*  PO2ART 77.0* 84.0 87.1   Liver Enzymes  Recent Labs Lab 12/29/13 2200 01/01/14 0430 01/02/14 0430  AST 293* 178* 177*  ALT 262* 282* 285*  ALKPHOS 63 72 72  BILITOT 1.1 1.5* 1.4*  ALBUMIN 1.8* 1.6* 1.7*   Cardiac  Enzymes  Recent Labs Lab 12/28/13 0320 12/28/13 0726 12/28/13 1250  TROPONINI 0.37* 0.49* 0.58*   Glucose  Recent Labs Lab 01/01/14 1159 01/01/14 1557 01/01/14 2009 01/02/14 0013 01/02/14 0427 01/02/14 0735  GLUCAP 111* 103* 98 93 90 79    Imaging Dg Chest Port 1 View  01/01/2014   CLINICAL DATA:  Ventilated patient.  Endocarditis.  EXAM: PORTABLE CHEST - 1 VIEW  COMPARISON:  12/31/2013.  FINDINGS: Patchy bilateral airspace opacity is similar to the previous day's study allowing for differences in technique. Likely pulmonary edema. Additional lung base opacity is likely atelectasis, also unchanged.  Endotracheal tube, left internal jugular central venous line and nasogastric tube are stable well-positioned. There is a stable tube projecting at the left lung base.  Swan-Ganz catheter is stable. The tip projects in the right ventricle.  No pneumothorax.  IMPRESSION: 1. Stable appearance from the previous day's study. 2. Persistent patchy areas of lung opacity in a central distribution. Pulmonary edema is suspected. No new lung opacities. Support apparatus is stable   Electronically Signed   By: Amie Portland M.D.   On: 01/01/2014 08:28     ASSESSMENT / PLAN:  PULMONARY A: Acute hypoxemic respiratory failure in the setting of bilateral pulmonary infiltrates Bilateral infiltrates due to cardiac decompensation w AI and fistula to RA. At risk also for ALI in setting bacteremia. HCAP is  possible but less likely now that we know the extent of his cardiac disease P:   - Self extubated, doing well. - Dr. Drue Second managing abx, agree that HCAP likely not etiology  - Follow resp cx's  CARDIOVASCULAR A: Aortic valvular endocarditis, fistula to RA, s/p repair 11/18 Acute systolic / valvular CHF 3rd degree Heart Block, currently paced w temp wire P:  - Hold diuretics for now. - Will need a permanent pacer when stable to proceed, will defer to cards.  RENAL A:  Acute renal  insufficiency, likely due to ATN, improving 11/19-20 P:   - Follow S Cr and electrolytes.  GASTROINTESTINAL A:  No issues P:   - Pantoprazole for SUP.  HEMATOLOGIC A:  Thrombocytopenia due to DIC, resolving P:  - Follow CBC  INFECTIOUS A:  Group B strep endocarditis, treated by septic brain abscesses and meningitis Possible embolic pneumonias versus HCAP P:   Blood 11/12 >> Negative Blood 11/16 >>  CSF 11/11 >> Group B strep (R to erythromycin)  Abx:  ceftriaxone 2 g 11/11 >> 11/17; 11/18 >> (6 weeks) Cefepime 11/17 >> 11/18 vanco 11/17 >> 11/19 Gentamicin 11/20 >>   - Doubt HCAP, treating Group B strep, ID managing, will  defer to ID.  ENDOCRINE A:  Hyperglycemia P:   - SSi per protocol.  NEUROLOGIC A:  Mild lethargy, likely related to brain lesions and overall illness.  P:   RASS goal: -1 to -2 - Sedation protocol ordered.  FAMILY  - Updates: Patient and family updated bedside.  - Inter-disciplinary family meet or Palliative Care meeting due by: 11/24.  TODAY'S SUMMARY: Self extubated and doing well, titrate O2 for sats, ambulate and start diet.  Transfer to SDU.  PCCM will sign off, please call back if needed.  Alyson Reedy, M.D. [facility] Pulmonary/Critical Care Medicine. Pager: [phone]. After hours pager: [phone].

## 2014-01-02 NOTE — Progress Notes (Signed)
3 Days Post-Op Procedure(s) (LRB): ASCENDING AORTIC ROOT REPLACEMENT with 49mm HOMOGRAFT, CLOSURE OF AORTIC TO RIGHT ATRIAL FISTULA, CLOSURE OF PATENT FORAMEN OVALE (N/A) REPAIR OF TRICUSPID VALVE LEAFLET (N/A) INTRAOPERATIVE TRANSESOPHAGEAL ECHOCARDIOGRAM (N/A) Subjective: Pressors off- lasix started OOB to chair Pacemaker dependent with good thresholds  Objective: Vital signs in last 24 hours: Temp:  [99.3 F (37.4 C)-100.4 F (38 C)] 99.7 F (37.6 C) (11/21 1100) Pulse Rate:  [96-110] 108 (11/21 1900) Cardiac Rhythm:  [-] A-V Sequential paced (11/21 2000) Resp:  [13-37] 37 (11/21 1900) BP: (88-124)/(45-73) 118/69 mmHg (11/21 1900) SpO2:  [95 %-99 %] 96 % (11/21 1900) Arterial Line BP: (105-143)/(42-65) 141/60 mmHg (11/21 1100) Weight:  [214 lb 4.6 oz (97.2 kg)] 214 lb 4.6 oz (97.2 kg) (11/21 0400)  Hemodynamic parameters for last 24 hours: PAP: (18-33)/(4-14) 28/5 mmHg CVP:  [7 mmHg-17 mmHg] 13 mmHg CO:  [7.4 L/min-7.6 L/min] 7.6 L/min CI:  [3.7 L/min/m2-3.8 L/min/m2] 3.8 L/min/m2  Intake/Output from previous day: 11/20 0701 - 11/21 0700 In: 1177.5 [I.V.:927.5; IV Piggyback:250] Out: 2725 [Urine:2695; Chest Tube:30] Intake/Output this shift: Total I/O In: -  Out: 50 [Urine:50]    Lab Results:  Recent Labs  01/01/14 0430 01/02/14 0430  WBC 18.7* 17.0*  HGB 10.0* 9.2*  HCT 30.0* 27.7*  PLT 152 161   BMET:  Recent Labs  01/01/14 0430 01/02/14 0430  NA 140 144  K 4.0 4.0  CL 107 105  CO2 23 22  GLUCOSE 123* 91  BUN 27* 23  CREATININE 1.03 1.03  CALCIUM 7.5* 7.7*    PT/INR:  Recent Labs  01/02/14 0430  LABPROT 17.5*  INR 1.42   ABG    Component Value Date/Time   PHART 7.464* 01/02/2014 0453   HCO3 22.9 01/02/2014 0453   TCO2 23.9 01/02/2014 0453   ACIDBASEDEF 0.3 01/02/2014 0453   O2SAT 96.6 01/02/2014 0453   CBG (last 3)   Recent Labs  01/02/14 0735 01/02/14 1131 01/02/14 1613  GLUCAP 79 131* 121*    Assessment/Plan: S/P  Procedure(s) (LRB): ASCENDING AORTIC ROOT REPLACEMENT with 98mm HOMOGRAFT, CLOSURE OF AORTIC TO RIGHT ATRIAL FISTULA, CLOSURE OF PATENT FORAMEN OVALE (N/A) REPAIR OF TRICUSPID VALVE LEAFLET (N/A) INTRAOPERATIVE TRANSESOPHAGEAL ECHOCARDIOGRAM (N/A) Advance diet- passed swallow test   LOS: 10 days    [name],[name] 01/02/2014

## 2014-01-03 ENCOUNTER — Inpatient Hospital Stay (HOSPITAL_COMMUNITY): Payer: BC Managed Care – PPO

## 2014-01-03 LAB — GLUCOSE, CAPILLARY
GLUCOSE-CAPILLARY: 118 mg/dL — AB (ref 70–99)
Glucose-Capillary: 118 mg/dL — ABNORMAL HIGH (ref 70–99)
Glucose-Capillary: 116 mg/dL — ABNORMAL HIGH (ref 70–99)
Glucose-Capillary: 101 mg/dL — ABNORMAL HIGH (ref 70–99)

## 2014-01-03 LAB — CBC
HCT: 25.9 % — ABNORMAL LOW (ref 39.0–52.0)
Hemoglobin: 8.5 g/dL — ABNORMAL LOW (ref 13.0–17.0)
MCH: 28.9 pg (ref 26.0–34.0)
MCHC: 32.8 g/dL (ref 30.0–36.0)
MCV: 88.1 fL (ref 78.0–100.0)
Platelets: 195 10*3/uL (ref 150–400)
RBC: 2.94 MIL/uL — ABNORMAL LOW (ref 4.22–5.81)
RDW: 15.2 % (ref 11.5–15.5)
WBC: 10.1 10*3/uL (ref 4.0–10.5)

## 2014-01-03 LAB — CULTURE, BLOOD (ROUTINE X 2)
CULTURE: NO GROWTH
Culture: NO GROWTH

## 2014-01-03 LAB — COMPREHENSIVE METABOLIC PANEL
ALT: 199 U/L — ABNORMAL HIGH (ref 0–53)
AST: 97 U/L — ABNORMAL HIGH (ref 0–37)
Albumin: 1.6 g/dL — ABNORMAL LOW (ref 3.5–5.2)
Alkaline Phosphatase: 83 U/L (ref 39–117)
Anion gap: 12 (ref 5–15)
BUN: 11 mg/dL (ref 6–23)
CO2: 27 mEq/L (ref 19–32)
Calcium: 7.7 mg/dL — ABNORMAL LOW (ref 8.4–10.5)
Chloride: 97 mEq/L (ref 96–112)
Creatinine, Ser: 0.87 mg/dL (ref 0.50–1.35)
GFR calc Af Amer: >90 mL/min (ref >90)
GFR calc non Af Amer: >90 mL/min (ref >90)
Glucose, Bld: 116 mg/dL — ABNORMAL HIGH (ref 70–99)
Potassium: 4 mEq/L (ref 3.7–5.3)
Sodium: 136 mEq/L — ABNORMAL LOW (ref 137–147)
Total Bilirubin: 1.1 mg/dL (ref 0.3–1.2)
Total Protein: 5.1 g/dL — ABNORMAL LOW (ref 6.0–8.3)

## 2014-01-03 LAB — CULTURE, RESPIRATORY: CULTURE: NO GROWTH

## 2014-01-03 LAB — CULTURE, RESPIRATORY W GRAM STAIN: Special Requests: NORMAL

## 2014-01-03 MED ORDER — POTASSIUM CHLORIDE 10 MEQ/50ML IV SOLN
10.0000 meq | INTRAVENOUS | Status: AC | PRN
  Administered 2014-01-03 (×3): 10 meq via INTRAVENOUS
  Filled 2014-01-03 (×2): qty 50

## 2014-01-03 NOTE — Progress Notes (Signed)
Spoke with nurse and encouraged PICC to be placed on R side in anticipating of L sided PPM.  Hopefully we can get CVL out soon in anticipation of PPM once clinically more stable.  Adequate epicardial threshold noted.  EP to follow along.

## 2014-01-03 NOTE — Progress Notes (Signed)
4 Days Post-Op Procedure(s) (LRB): ASCENDING AORTIC ROOT REPLACEMENT with 53mm HOMOGRAFT, CLOSURE OF AORTIC TO RIGHT ATRIAL FISTULA, CLOSURE OF PATENT FORAMEN OVALE (N/A) REPAIR OF TRICUSPID VALVE LEAFLET (N/A) INTRAOPERATIVE TRANSESOPHAGEAL ECHOCARDIOGRAM (N/A) Subjective: Low-grade fever last night 99.7 Patient ambulated 60 feet with PT today Maintaining sinus rhythm Surgical incisions clean and dry Chest x-ray improving with Lasix IV every 8 hours White count improved 17 K>> 10K today Heart block continues-turning pacemaker rate down to 40 without underlying activity Objective: Vital signs in last 24 hours: Temp:  [98 F (36.7 C)-98.2 F (36.8 C)] 98 F (36.7 C) (11/22 0741) Pulse Rate:  [98-110] 110 (11/22 1100) Cardiac Rhythm:  [-] A-V Sequential paced (11/22 1000) Resp:  [18-37] 24 (11/22 1100) BP: (84-128)/(40-92) 104/88 mmHg (11/22 1100) SpO2:  [93 %-100 %] 93 % (11/22 1100) Weight:  [218 lb 0.6 oz (98.9 kg)] 218 lb 0.6 oz (98.9 kg) (11/22 0600)  Hemodynamic parameters for last 24 hours: CVP:  [5 mmHg-17 mmHg] 5 mmHg  Intake/Output from previous day: 11/21 0701 - 11/22 0700 In: 2995 [P.O.:2085; I.V.:510; IV Piggyback:400] Out: 3340 [Urine:3340] Intake/Output this shift: Total I/O In: 290 [P.O.:240; IV Piggyback:50] Out: 1760 [Urine:1760]  Neuro intact Lungs clear Generalized 2+ edema Soft systolic flow murmur through homograft aVR  Lab Results:  Recent Labs  01/02/14 0430 01/02/14 2153 01/03/14 0513  WBC 17.0*  --  10.1  HGB 9.2* 9.2* 8.5*  HCT 27.7* 27.0* 25.9*  PLT 161  --  195   BMET:  Recent Labs  01/02/14 0430 01/02/14 2153 01/03/14 0513  NA 144 137 136*  K 4.0 3.5* 4.0  CL 105 97 97  CO2 22  --  27  GLUCOSE 91 121* 116*  BUN 23 12 11   CREATININE 1.03 0.80 0.87  CALCIUM 7.7*  --  7.7*    PT/INR:  Recent Labs  01/02/14 0430  LABPROT 17.5*  INR 1.42   ABG    Component Value Date/Time   PHART 7.464* 01/02/2014 0453   HCO3 22.9  01/02/2014 0453   TCO2 25 01/02/2014 2153   ACIDBASEDEF 0.3 01/02/2014 0453   O2SAT 96.6 01/02/2014 0453   CBG (last 3)   Recent Labs  01/03/14 0029 01/03/14 0403 01/03/14 0740  GLUCAP 118* 118* 116*    Assessment/Plan: S/P Procedure(s) (LRB): ASCENDING AORTIC ROOT REPLACEMENT with 31mm HOMOGRAFT, CLOSURE OF AORTIC TO RIGHT ATRIAL FISTULA, CLOSURE OF PATENT FORAMEN OVALE (N/A) REPAIR OF TRICUSPID VALVE LEAFLET (N/A) INTRAOPERATIVE TRANSESOPHAGEAL ECHOCARDIOGRAM (N/A) Continue IV antibiotics Place PICC line and remove operative central line Patient will need permanent pacemaker-threshold still excellent on temporary wires   LOS: 11 days    [name],PETER 01/03/2014

## 2014-01-03 NOTE — Plan of Care (Signed)
Problem: Phase II - Intermediate Post-Op Goal: Wean to Extubate Outcome: Completed/Met Date Met:  01/03/14 Goal: Maintain Hemodynamic Stability Outcome: Completed/Met Date Met:  01/03/14 Goal: Pain controlled with appropriate interventions Outcome: Completed/Met Date Met:  01/03/14

## 2014-01-03 NOTE — Progress Notes (Signed)
Spoke with Rosey Bath from IV teamn regarding PICC line placement.  Dr. Johney Frame wanted to see if the PICC could be placed as soon as possible so that the CVC could be pulled 24 hours out from the procedure to implant a pacer.  A PICC nurse was not available this afternoon, but Rosey Bath said she would pass along the importance of moving Mr. Vedros to the top of the list for PICC placement.  Tommi Emery

## 2014-01-03 NOTE — Progress Notes (Signed)
Patient was a source of blood exposure to staff. Blood work ordered per exposure policy. AC Cindy Parrott aware and completed necessary paperwork

## 2014-01-03 NOTE — Progress Notes (Signed)
CT surgery p.m. Rounds  Patient sitting up in chair eating dinner Paced rhythm with stable blood pressure Evening temperature 97.2 Continues with good diuresis Stable day

## 2014-01-03 NOTE — Evaluation (Addendum)
Physical Therapy Evaluation Patient Details Name: [name] MRN: [id number] DOB: 08/11/1985 Today's Date: 01/03/2014   History of Present Illness  28 yo man, admitted 11/11 with meningitis c/b transaminitis and DIC.  LP showed Group B strep and MRI brain showed embolic process. 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 Developed progressive dyspnea and respiratory distress 11/17 with evolving (new) B infiltrates on CXR. Intubated. 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 To OR for emergency repair on 11/18.  Pt self-extubated on 01/01/2014.  Clinical Impression  Patient is s/p above surgeries resulting in functional limitations due to the deficits listed below (see PT Problem List).  Patient will benefit from skilled PT to increase their independence and safety with mobility to allow discharge to the venue listed below. PTA pt very independent and working full-time. Pt very motivated to return to independence. Pt answering questions appropriately throughout session and able to follow commands consistently.      Follow Up Recommendations Outpatient PT;Supervision/Assistance - 24 hour    Equipment Recommendations   (TBD)    Recommendations for Other Services OT consult     Precautions / Restrictions Precautions Precautions: Fall;Sternal Precaution Comments: educated on sternal precautions throughout session; pt with external pacemaker  Restrictions Weight Bearing Restrictions: No      Mobility  Bed Mobility               General bed mobility comments: pt up in chair and returned to chair   Transfers Overall transfer level: Needs assistance Equipment used: 1 person hand held assist Transfers: Sit to/from Stand Sit to Stand: Min assist         General transfer comment: handheld (A) to balance; max cues for hand placement and to adhere to sternal precautions   Ambulation/Gait Ambulation/Gait assistance: +2  safety/equipment;Min assist Ambulation Distance (Feet): 60 Feet Assistive device: 1 person hand held assist Gait Pattern/deviations: Step-through pattern;Decreased stride length;Shuffle;Wide base of support Gait velocity: decreased Gait velocity interpretation: Below normal speed for age/gender General Gait Details: 2nd person helpful to follow with chair and manage lines; handheld (A) to balance; pt very deconditioned and requiring (A) to balance with directional changes; cues for safety   Stairs            Wheelchair Mobility    Modified Rankin (Stroke Patients Only)       Balance Overall balance assessment: Needs assistance Sitting-balance support: Feet supported;No upper extremity supported Sitting balance-Leahy Scale: Fair     Standing balance support: During functional activity;Single extremity supported Standing balance-Leahy Scale: Poor Standing balance comment: handheld (A) to steady with standing; slight sway posteriorly initially                             Pertinent Vitals/Pain Pain Assessment: Faces Faces Pain Scale: Hurts even more Pain Location: "when i pee" Pain Descriptors / Indicators:  ("stinging") Pain Intervention(s): Monitored during session;Premedicated before session;Repositioned    Home Living Family/patient expects to be discharged to:: Private residence Living Arrangements: Spouse/significant other Available Help at Discharge: Family;Friend(s);Available 24 hours/day Type of Home: House Home Access: Stairs to enter Entrance Stairs-Rails: Can reach both;Right;Left Entrance Stairs-Number of Steps: "a lot of steps" Home Layout: One level Home Equipment: None      Prior Function Level of Independence: Independent         Comments: pt working as [employer] and [employer]     [employer]  Extremity/Trunk Assessment   Upper Extremity Assessment: Defer to OT evaluation           Lower Extremity Assessment:  Generalized weakness      Cervical / Trunk Assessment: Normal  Communication   Communication: No difficulties  Cognition Arousal/Alertness: Awake/alert Behavior During Therapy: WFL for tasks assessed/performed Overall Cognitive Status: Within Functional Limits for tasks assessed                      General Comments General comments (skin integrity, edema, etc.): educated on use of pillow for splinting and sternal precautions with activity     Exercises        Assessment/Plan    PT Assessment Patient needs continued PT services  PT Diagnosis Difficulty walking;Generalized weakness;Acute pain   PT Problem List Decreased strength;Decreased activity tolerance;Decreased balance;Decreased mobility;Decreased knowledge of precautions;Pain;Cardiopulmonary status limiting activity  PT Treatment Interventions DME instruction;Gait training;Stair training;Functional mobility training;Therapeutic activities;Therapeutic exercise;Balance training;Neuromuscular re-education;Patient/family education   PT Goals (Current goals can be found in the Care Plan section) Acute Rehab PT Goals Patient Stated Goal: to go home  PT Goal Formulation: With patient Time For Goal Achievement: 01/10/14 Potential to Achieve Goals: Good    Frequency Min 3X/week   Barriers to discharge        Co-evaluation               End of Session   Activity Tolerance: Patient tolerated treatment well Patient left: in chair;with call bell/phone within reach;with nursing/sitter in room;with family/visitor present Nurse Communication: Mobility status;Precautions         Time: 7903-8333 PT Time Calculation (min) (ACUTE ONLY): 22 min   Charges:   PT Evaluation $Initial PT Evaluation Tier I: 1 Procedure PT Treatments $Gait Training: 8-22 mins   PT G CodesDonell Sievert, [place]  [phone] 01/03/2014, 9:53 AM

## 2014-01-04 ENCOUNTER — Encounter (HOSPITAL_COMMUNITY)
Admission: EM | Disposition: A | Discharge status: Home Health | Payer: Self-pay | Source: Home / Self Care | Attending: Cardiothoracic Surgery

## 2014-01-04 ENCOUNTER — Inpatient Hospital Stay (HOSPITAL_COMMUNITY): Payer: BC Managed Care – PPO

## 2014-01-04 DIAGNOSIS — I509 Heart failure, unspecified: Secondary | ICD-10-CM

## 2014-01-04 DIAGNOSIS — I442 Atrioventricular block, complete: Secondary | ICD-10-CM

## 2014-01-04 DIAGNOSIS — Z950 Presence of cardiac pacemaker: Secondary | ICD-10-CM

## 2014-01-04 HISTORY — PX: INSERT / REPLACE / REMOVE PACEMAKER: SUR710

## 2014-01-04 HISTORY — DX: Presence of cardiac pacemaker: Z95.0

## 2014-01-04 HISTORY — PX: PERMANENT PACEMAKER INSERTION: SHX5480

## 2014-01-04 LAB — GLUCOSE, CAPILLARY
GLUCOSE-CAPILLARY: 107 mg/dL — AB (ref 70–99)
GLUCOSE-CAPILLARY: 113 mg/dL — AB (ref 70–99)
Glucose-Capillary: 103 mg/dL — ABNORMAL HIGH (ref 70–99)
Glucose-Capillary: 101 mg/dL — ABNORMAL HIGH (ref 70–99)
Glucose-Capillary: 112 mg/dL — ABNORMAL HIGH (ref 70–99)
Glucose-Capillary: 102 mg/dL — ABNORMAL HIGH (ref 70–99)
Glucose-Capillary: 99 mg/dL (ref 70–99)

## 2014-01-04 LAB — POCT I-STAT 4, (NA,K, GLUC, HGB,HCT)
GLUCOSE: 100 mg/dL — AB (ref 70–99)
HEMATOCRIT: 28 % — AB (ref 39.0–52.0)
Hemoglobin: 9.5 g/dL — ABNORMAL LOW (ref 13.0–17.0)
Potassium: 3.5 mEq/L — ABNORMAL LOW (ref 3.7–5.3)
Sodium: 133 mEq/L — ABNORMAL LOW (ref 137–147)

## 2014-01-04 LAB — CBC
HCT: 27.5 % — ABNORMAL LOW (ref 39.0–52.0)
Hemoglobin: 8.9 g/dL — ABNORMAL LOW (ref 13.0–17.0)
MCH: 28.3 pg (ref 26.0–34.0)
MCHC: 32.4 g/dL (ref 30.0–36.0)
MCV: 87.6 fL (ref 78.0–100.0)
Platelets: 247 10*3/uL (ref 150–400)
RBC: 3.14 MIL/uL — ABNORMAL LOW (ref 4.22–5.81)
RDW: 14.7 % (ref 11.5–15.5)
WBC: 8 10*3/uL (ref 4.0–10.5)

## 2014-01-04 LAB — TISSUE CULTURE
Culture: NO GROWTH
Culture: NO GROWTH

## 2014-01-04 LAB — BASIC METABOLIC PANEL
Anion gap: 11 (ref 5–15)
BUN: 9 mg/dL (ref 6–23)
CO2: 26 mEq/L (ref 19–32)
Calcium: 8 mg/dL — ABNORMAL LOW (ref 8.4–10.5)
Chloride: 98 mEq/L (ref 96–112)
Creatinine, Ser: 0.86 mg/dL (ref 0.50–1.35)
GFR calc Af Amer: >90 mL/min (ref >90)
GFR calc non Af Amer: >90 mL/min (ref >90)
Glucose, Bld: 109 mg/dL — ABNORMAL HIGH (ref 70–99)
Potassium: 4 mEq/L (ref 3.7–5.3)
Sodium: 135 mEq/L — ABNORMAL LOW (ref 137–147)

## 2014-01-04 SURGERY — PERMANENT PACEMAKER INSERTION
Anesthesia: LOCAL

## 2014-01-04 MED ORDER — ONDANSETRON HCL 4 MG/2ML IJ SOLN: 4.0000 mg | Freq: Four times a day (QID) | INTRAMUSCULAR | Status: DC | PRN

## 2014-01-04 MED ORDER — SODIUM CHLORIDE 0.9 % IJ SOLN
10.0000 mL | Freq: Two times a day (BID) | INTRAMUSCULAR | Status: DC
  Administered 2014-01-04: 10 mL

## 2014-01-04 MED ORDER — HEPARIN (PORCINE) IN NACL 2-0.9 UNIT/ML-% IJ SOLN
INTRAMUSCULAR | Status: AC
  Filled 2014-01-04: qty 500

## 2014-01-04 MED ORDER — LIDOCAINE HCL (PF) 1 % IJ SOLN
INTRAMUSCULAR | Status: AC
  Filled 2014-01-04: qty 30

## 2014-01-04 MED ORDER — SODIUM CHLORIDE 0.9 % IJ SOLN
10.0000 mL | INTRAMUSCULAR | Status: DC | PRN
  Administered 2014-01-07: 10 mL
  Filled 2014-01-04: qty 40

## 2014-01-04 MED ORDER — ACETAMINOPHEN 325 MG PO TABS: 325.0000 mg | ORAL_TABLET | ORAL | Status: DC | PRN

## 2014-01-04 MED ORDER — MIDAZOLAM HCL 5 MG/5ML IJ SOLN
INTRAMUSCULAR | Status: AC
  Filled 2014-01-04: qty 5

## 2014-01-04 MED ORDER — SODIUM CHLORIDE 0.9 % IV SOLN
INTRAVENOUS | Status: DC
  Administered 2014-01-04: 14:00:00 via INTRAVENOUS

## 2014-01-04 MED ORDER — CEFAZOLIN SODIUM-DEXTROSE 2-3 GM-% IV SOLR
2.0000 g | INTRAVENOUS | Status: DC
  Filled 2014-01-04: qty 50

## 2014-01-04 MED ORDER — FENTANYL CITRATE 0.05 MG/ML IJ SOLN
INTRAMUSCULAR | Status: AC
  Filled 2014-01-04: qty 2

## 2014-01-04 MED ORDER — SODIUM CHLORIDE 0.9 % IR SOLN
80.0000 mg | Status: DC
  Filled 2014-01-04 (×2): qty 2

## 2014-01-04 MED ORDER — HEPARIN SODIUM (PORCINE) 5000 UNIT/ML IJ SOLN
5000.0000 [IU] | Freq: Three times a day (TID) | INTRAMUSCULAR | Status: DC
  Administered 2014-01-05 (×2): 5000 [IU] via SUBCUTANEOUS
  Filled 2014-01-04 (×4): qty 1

## 2014-01-04 MED ORDER — VANCOMYCIN HCL IN DEXTROSE 1-5 GM/200ML-% IV SOLN
1000.0000 mg | Freq: Two times a day (BID) | INTRAVENOUS | Status: AC
  Administered 2014-01-04: 1000 mg via INTRAVENOUS
  Filled 2014-01-04: qty 200

## 2014-01-04 NOTE — Progress Notes (Signed)
Physical Therapy Treatment Patient Details Name: [name] MRN: [id number] DOB: 05/09/1985 Today's Date: 01/04/2014    History of Present Illness 28 yo man, admitted 11/11 with meningitis c/b transaminitis and DIC.  LP showed Group B strep and MRI brain showed embolic process. 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 Developed progressive dyspnea and respiratory distress 11/17 with evolving (new) B infiltrates on CXR. Intubated. 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 To OR for emergency aortic root repair on 11/18.  Pt self-extubated on 01/01/2014.    PT Comments    Patient was found by P.T. standing to urinate next to chair, chair unlocked, temporary external pacemaker on floor.  Upon questioning, patient is unaware of the existence of temporary pacer, has no recall of sternal precautions, and has decreased safety awareness.  At start and end of session, educated pt on sternal precautions, temporary pacer placement and handling, and importance of calling for assistance with mobility.  Paced HR 99 throughout, SpO2 100% on RA. Will continue to follow to increase ability to ambulate safely, increase strength, and reinforce precautions.    Follow Up Recommendations  Outpatient PT;Supervision/Assistance - 24 hour     Equipment Recommendations  None recommended by PT    Recommendations for Other Services       Precautions / Restrictions Precautions Precautions: Fall;Sternal Precaution Comments: educated on sternal precautions throughout session; pt with external pacemaker  Restrictions Weight Bearing Restrictions: No    Mobility  Bed Mobility Overal bed mobility: Modified Independent Bed Mobility: Rolling;Sit to Supine Rolling: Modified independent (Device/Increase time)     Sit to supine: Modified independent (Device/Increase time)   General bed mobility comments: pt standing next to chair on arrival; able to perform  sit-roll-supine without cues at end of session  Transfers Overall transfer level: Needs assistance Equipment used: None Transfers: Sit to/from Stand Sit to Stand: Min guard         General transfer comment: min guard for patient safety; verbal cues throughout to maintain sternal precautions and to control descent to seated position; performed x2 with cues for controlled descent on second trial  Ambulation/Gait Ambulation/Gait assistance: Min guard Ambulation Distance (Feet): 400 Feet Assistive device: None Gait Pattern/deviations: Step-through pattern;Decreased stride length;Drifts right/left Gait velocity: decreased Gait velocity interpretation: Below normal speed for age/gender General Gait Details: no physical assist needed, but min guard for patient safety; gait velocity varied throughout, pt sometimes drifting to right/left; verbal cues while ambulating to maintain a straight course   Stairs Stairs: Yes Stairs assistance: Min guard Stair Management: Forwards;Two rails Number of Stairs: 5 General stair comments: slow ascent and descent with min guard for patient safety   Wheelchair Mobility    Modified Rankin (Stroke Patients Only)       Balance Overall balance assessment: Needs assistance Sitting-balance support: No upper extremity supported;Feet supported Sitting balance-Leahy Scale: Good     Standing balance support: No upper extremity supported;During functional activity Standing balance-Leahy Scale: Good Standing balance comment: pt still exhibiting periodic sway during ambulation; denies dizziness                    Cognition Arousal/Alertness: Awake/alert Behavior During Therapy: WFL for tasks assessed/performed Overall Cognitive Status: No family/caregiver present to determine baseline cognitive functioning       Memory: Decreased recall of precautions              Exercises General Exercises - Lower Extremity Long Arc Quad: AROM;20  reps;Both;Seated Hip Flexion/Marching: AROM;20 reps;Seated;Both    General Comments        Pertinent Vitals/Pain Pain Assessment: No/denies pain  HR 99 paced SpO2: 100% (RA)    Home Living                      Prior Function            PT Goals (current goals can now be found in the care plan section) Progress towards PT goals: Progressing toward goals    Frequency  Min 2X/week    PT Plan Frequency needs to be updated    Co-evaluation             End of Session Equipment Utilized During Treatment: Gait belt Activity Tolerance: Patient tolerated treatment well Patient left: in bed;with call bell/phone within reach;with bed alarm set     Time: 3382-5053 PT Time Calculation (min) (ACUTE ONLY): 23 min  Charges:  $Gait Training: 8-22 mins $Therapeutic Exercise: 8-22 mins                    G Codes:      Keo Schirmer SPT 01/04/2014, 10:33 AM

## 2014-01-04 NOTE — Progress Notes (Signed)
ANTIBIOTIC CONSULT NOTE - FOLLOW UP  Pharmacy Consult for Gentamicin Indication: Group B strep endocarditis   No Known Allergies  Patient Measurements: Height: 5\' 5"  (165.1 cm) Weight: 211 lb 13.8 oz (96.1 kg) IBW/kg (Calculated) : 61.5 Adjusted Body Weight:    Vital Signs: Temp: 99.4 F (37.4 C) (11/23 1129) Temp Source: Oral (11/23 1129) BP: 118/83 mmHg (11/23 1300) Pulse Rate: 101 (11/23 1300) Intake/Output from previous day: 11/22 0701 - 11/23 0700 In: 1360 [P.O.:480; I.V.:480; IV Piggyback:400] Out: 4910 [Urine:4910] Intake/Output from this shift: Total I/O In: 185.5 [I.V.:185.5] Out: 1475 [Urine:1475]  Labs:  Recent Labs  01/02/14 0430 01/02/14 2153 01/03/14 0513 01/04/14 0500  WBC 17.0*  --  10.1 8.0  HGB 9.2* 9.2* 8.5* 8.9*  PLT 161  --  195 247  CREATININE 1.03 0.80 0.87 0.86   Estimated Creatinine Clearance: 138.6 mL/min (by C-G formula based on Cr of 0.86). No results for input(s): VANCOTROUGH, VANCOPEAK, VANCORANDOM, GENTTROUGH, GENTPEAK, GENTRANDOM, TOBRATROUGH, TOBRAPEAK, TOBRARND, AMIKACINPEAK, AMIKACINTROU, AMIKACIN in the last 72 hours.   Microbiology: Recent Results (from the past 720 hour(s))  CSF culture     Status: None   Collection Time: 12/23/13 12:20 PM  Result Value Ref Range Status   Specimen Description CSF  Final   Special Requests NONE  Final   Gram Stain   Final    CYOSPIN NO WBC SEEN NO ORGANISMS SEEN Performed at [facility]    Culture   Final    FEW GROUP B STREP(S.AGALACTIAE)ISOLATED Note: CRITICAL RESULT CALLED TO, READ BACK BY AND VERIFIED WITH: STEPHANIE SHAW @ 0744 ON [id number] BY [facility] Performed at [facility]    Report Status 12/25/2013 FINAL  Final   Organism ID, Bacteria GROUP B STREP(S.AGALACTIAE)ISOLATED  Final      Susceptibility   Group b strep(s.agalactiae)isolated - MIC*    ERYTHROMYCIN >=8 RESISTANT Resistant     PENICILLIN <=0.06 SENSITIVE Sensitive     VANCOMYCIN 0.5 SENSITIVE  Sensitive     AMPICILLIN <=0.25 SENSITIVE Sensitive     * FEW GROUP B STREP(S.AGALACTIAE)ISOLATED  MRSA PCR Screening     Status: None   Collection Time: 12/23/13  5:27 PM  Result Value Ref Range Status   MRSA by PCR NEGATIVE NEGATIVE Final    Comment:        The GeneXpert MRSA Assay (FDA approved for NASAL specimens only), is one component of a comprehensive MRSA colonization surveillance program. It is not intended to diagnose MRSA infection nor to guide or monitor treatment for MRSA infections.   Culture, blood (routine x 2)     Status: None   Collection Time: 12/24/13 10:50 AM  Result Value Ref Range Status   Specimen Description BLOOD RIGHT HAND  Final   Special Requests   Final    BOTTLES DRAWN AEROBIC AND ANAEROBIC 6CC AER 5CC ANA   Culture  Setup Time   Final    12/24/2013 18:48 Performed at [facility]    Culture   Final    NO GROWTH 5 DAYS Performed at [facility]    Report Status 12/30/2013 FINAL  Final  Culture, blood (routine x 2)     Status: None   Collection Time: 12/24/13 10:58 AM  Result Value Ref Range Status   Specimen Description BLOOD LEFT ANTECUBITAL  Final   Special Requests BOTTLES DRAWN AEROBIC AND ANAEROBIC 10CC  Final   Culture  Setup Time   Final    12/24/2013 18:48 Performed at  [facility]    Culture   Final    NO GROWTH 5 DAYS Performed at [facility]    Report Status 12/30/2013 FINAL  Final  Culture, blood (routine x 2)     Status: None   Collection Time: 12/28/13 11:50 AM  Result Value Ref Range Status   Specimen Description BLOOD LEFT ANTECUBITAL  Final   Special Requests BOTTLES DRAWN AEROBIC AND ANAEROBIC 10CC  Final   Culture  Setup Time   Final    12/28/2013 16:46 Performed at [facility]    Culture   Final    NO GROWTH 5 DAYS Performed at [facility]    Report Status 01/03/2014 FINAL  Final  Culture, blood (routine x 2)     Status: None   Collection Time: 12/28/13  12:05 PM  Result Value Ref Range Status   Specimen Description BLOOD LEFT HAND  Final   Special Requests BOTTLES DRAWN AEROBIC ONLY 10CC  Final   Culture  Setup Time   Final    12/28/2013 16:46 Performed at [facility]    Culture   Final    NO GROWTH 5 DAYS Performed at [facility]    Report Status 01/03/2014 FINAL  Final  Culture, Urine     Status: None   Collection Time: 12/29/13  3:32 PM  Result Value Ref Range Status   Specimen Description URINE, CATHETERIZED  Final   Special Requests NONE  Final   Culture  Setup Time   Final    12/29/2013 21:29 Performed at [facility]    Colony Count NO GROWTH Performed at [facility]   Final   Culture NO GROWTH Performed at [facility]   Final   Report Status 12/30/2013 FINAL  Final  Tissue culture     Status: None   Collection Time: 12/30/13 11:18 AM  Result Value Ref Range Status   Specimen Description TISSUE VALVE  Final   Special Requests A) AORTIC VALVE W/ VEGETATION  Final   Gram Stain   Final    ABUNDANT WBC PRESENT,BOTH PMN AND MONONUCLEAR NO SQUAMOUS EPITHELIAL CELLS SEEN ABUNDANT GRAM POSITIVE COCCI IN PAIRS IN CHAINS Performed at [facility]    Culture   Final    NO GROWTH 3 DAYS Performed at [facility]    Report Status 01/04/2014 FINAL  Final  Fungus Culture with Smear     Status: None (Preliminary result)   Collection Time: 12/30/13 11:18 AM  Result Value Ref Range Status   Specimen Description TISSUE VALVE  Final   Special Requests A) AORTIC VALVE W/ VEGETATION  Final   Fungal Smear   Final    NO YEAST OR FUNGAL ELEMENTS SEEN Performed at [facility]    Culture   Final    CULTURE IN PROGRESS FOR FOUR WEEKS Performed at [facility]    Report Status PENDING  Incomplete  Gram stain     Status: None   Collection Time: 12/30/13 11:18 AM  Result Value Ref Range Status   Specimen Description TISSUE VALVE  Final   Special  Requests A) AORTIC VALVE W/ VEGETATION  Final   Gram Stain   Final    ABUNDANT WBC PRESENT,BOTH PMN AND MONONUCLEAR ABUNDANT GRAM POSITIVE COCCI IN PAIRS IN CHAINS    Report Status 12/30/2013 FINAL  Final  Tissue culture     Status: None   Collection Time: 12/30/13 11:20 AM  Result Value Ref Range Status   Specimen Description TISSUE OTHER  Final   Special Requests B) RIGHT ATRIUM W/ VEGETATION  Final   Gram Stain   Final    MODERATE WBC PRESENT,BOTH PMN AND MONONUCLEAR NO SQUAMOUS EPITHELIAL CELLS SEEN RARE GRAM POSITIVE COCCI IN PAIRS Performed at [facility]    Culture   Final    NO GROWTH 3 DAYS Performed at [facility]    Report Status 01/04/2014 FINAL  Final  Fungus Culture with Smear     Status: None (Preliminary result)   Collection Time: 12/30/13 11:20 AM  Result Value Ref Range Status   Specimen Description TISSUE OTHER  Final   Special Requests B) RIGHT ATRIUM W/ VEGETATION  Final   Fungal Smear   Final    NO YEAST OR FUNGAL ELEMENTS SEEN Performed at [facility]    Culture   Final    CULTURE IN PROGRESS FOR FOUR WEEKS Performed at [facility]    Report Status PENDING  Incomplete  Gram stain     Status: None   Collection Time: 12/30/13 11:20 AM  Result Value Ref Range Status   Specimen Description TISSUE OTHER  Final   Special Requests B) RIGHT ATRIUM W/ VEGETATION  Final   Gram Stain   Final    MODERATE WBC PRESENT,BOTH PMN AND MONONUCLEAR RARE GRAM POSITIVE COCCI IN PAIRS    Report Status 12/30/2013 FINAL  Final  Culture, respiratory (NON-Expectorated)     Status: None   Collection Time: 01/01/14 10:50 AM  Result Value Ref Range Status   Specimen Description TRACHEAL ASPIRATE  Final   Special Requests Normal  Final   Gram Stain   Final    MODERATE WBC PRESENT,BOTH PMN AND MONONUCLEAR RARE SQUAMOUS EPITHELIAL CELLS PRESENT NO ORGANISMS SEEN Performed at [facility]    Culture   Final    NO GROWTH 2  DAYS Performed at [facility]    Report Status 01/03/2014 FINAL  Final    Anti-infectives    Start     Dose/Rate Route Frequency Ordered Stop   01/04/14 1415  gentamicin (GARAMYCIN) 80 mg in sodium chloride irrigation 0.9 % 500 mL irrigation     80 mg Irrigation On call 01/04/14 1310 01/05/14 1415   01/04/14 1415  ceFAZolin (ANCEF) IVPB 2 g/50 mL premix     2 g100 mL/hr over 30 Minutes Intravenous On call 01/04/14 1310 01/05/14 1415   01/01/14 1200  gentamicin (GARAMYCIN) IVPB 80 mg     80 mg100 mL/hr over 30 Minutes Intravenous Every 8 hours 01/01/14 1011     12/30/13 2200  cefTRIAXone (ROCEPHIN) 2 g in dextrose 5 % 50 mL IVPB - Premix     2 g100 mL/hr over 30 Minutes Intravenous Every 12 hours 12/30/13 1932     12/30/13 0800  vancomycin (VANCOCIN) 1,500 mg in sodium chloride 0.9 % 500 mL IVPB  Status:  Discontinued     1,500 mg250 mL/hr over 120 Minutes Intravenous Every 12 hours 12/29/13 1948 12/30/13 0745   12/30/13 0800  vancomycin (VANCOCIN) IVPB 1000 mg/200 mL premix  Status:  Discontinued     1,000 mg200 mL/hr over 60 Minutes Intravenous Every 12 hours 12/30/13 0745 12/31/13 1355   12/30/13 0200  vancomycin (VANCOCIN) 1,500 mg in sodium chloride 0.9 % 500 mL IVPB  Status:  Discontinued     1,500 mg250 mL/hr over 120 Minutes Intravenous Every 12 hours 12/29/13 1326 12/29/13 1948  12/29/13 2030  vancomycin (VANCOCIN) 1,250 mg in sodium chloride 0.9 % 250 mL IVPB     1,250 mg166.7 mL/hr over 90 Minutes Intravenous To Surgery 12/29/13 2025 12/30/13 0930   12/29/13 2030  cefUROXime (ZINACEF) 1.5 g in dextrose 5 % 50 mL IVPB     1.5 g100 mL/hr over 30 Minutes Intravenous To Surgery 12/29/13 2025 12/30/13 1523   12/29/13 2030  cefUROXime (ZINACEF) 750 mg in dextrose 5 % 50 mL IVPB  Status:  Discontinued     750 mg100 mL/hr over 30 Minutes Intravenous To Surgery 12/29/13 2025 12/30/13 1638   12/29/13 2000  vancomycin (VANCOCIN) 2,000 mg in sodium chloride 0.9 % 500 mL IVPB      2,000 mg250 mL/hr over 120 Minutes Intravenous  Once 12/29/13 1948 12/29/13 2223   12/29/13 1230  ceFEPIme (MAXIPIME) 2 g in dextrose 5 % 50 mL IVPB  Status:  Discontinued     2 g100 mL/hr over 30 Minutes Intravenous Every 12 hours 12/29/13 1138 12/30/13 1932   12/29/13 1230  vancomycin (VANCOCIN) 2,000 mg in sodium chloride 0.9 % 500 mL IVPB  Status:  Discontinued     2,000 mg250 mL/hr over 120 Minutes Intravenous  Once 12/29/13 1138 12/29/13 2023   12/24/13 0600  vancomycin (VANCOCIN) 1,250 mg in sodium chloride 0.9 % 250 mL IVPB  Status:  Discontinued     1,250 mg166.7 mL/hr over 90 Minutes Intravenous Every 12 hours 12/23/13 1751 12/24/13 0918   12/24/13 0200  cefTRIAXone (ROCEPHIN) 2 g in dextrose 5 % 50 mL IVPB - Premix  Status:  Discontinued     2 g100 mL/hr over 30 Minutes Intravenous Every 12 hours 12/23/13 1751 12/29/13 1132   12/23/13 1800  vancomycin (VANCOCIN) 1,500 mg in sodium chloride 0.9 % 500 mL IVPB     1,500 mg250 mL/hr over 120 Minutes Intravenous STAT 12/23/13 1749 12/23/13 2326   12/23/13 1800  acyclovir (ZOVIRAX) 615 mg in dextrose 5 % 100 mL IVPB  Status:  Discontinued     10 mg/kg  61.5 kg (Ideal)112.3 mL/hr over 60 Minutes Intravenous 3 times per day 12/23/13 1750 12/24/13 0857   12/23/13 1400  cefTRIAXone (ROCEPHIN) 2 g in dextrose 5 % 50 mL IVPB     2 g100 mL/hr over 30 Minutes Intravenous  Once 12/23/13 1347 12/23/13 1428   12/23/13 1349  cefTRIAXone (ROCEPHIN) 2 G injection    Comments:  [name], Megan   : cabinet override      12/23/13 1349 12/24/13 0159      Assessment: 28 YOM presents to ED with meningitis. Admitted to ICU on 11/17 with worsening work of breathing-possible intubation. ID is following for possible bacteremia with Endocarditis.  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 ID on board - recom'd MRI: c/w meningitis & widespread microabscesses; s/p CVTS surgery/repair on 11/18. - Tmax 99.4. WBC 8 down. Scr 0.86 stable. Plan PPM for 3rd degree block.  Cefepime 11/17>11/17 Vanc 11/11>11/12, 11/17>11/19 CTX 11/11>11/17; 11/18> Acyclovir 11/11 >11/12 Gent 11/20 > (plan x 2 weeks)  11/11 CSF Cx > Group B strep (sens to Amp/PCN/Vanc, Res eryth) 11/11 BCx > Neg 11/16 BCx>NGTD 11/17 urine- neg 11/18 valve tissue- ngtd 11/20 TA   Cardiovas:  POD #5 (OR day 11/18): ascending aortic root replacement with homograft and repair of septal leaflet tricuspid valve, closure of PFO. BP soft-wnl, HR wnl-tachy, 3rd degr heart block, paced w/temp wire, will need pacer. On no CV meds. VSS   Goal of Therapy:  Gentamicin trough level <2 mcg/ml  Plan:  - Cont Rocephin 2g q12h - Add Gent 80 mg/8h     [place] Current S. Merilynn Finland, PharmD, BCPS Clinical Staff Pharmacist Pager [phone]  Misty Stanley Stillinger 01/04/2014,2:10 PM

## 2014-01-04 NOTE — Progress Notes (Signed)
[facility] BRIEF SICU PROGRESS NOTE  Day of Surgery  S/P Procedure(s) (LRB): PERMANENT PACEMAKER INSERTION (N/A)   Stable this evening after pacer placement AV paced w/ stable BP  Plan: Continue current plan  Declin Rajan H 01/04/2014 10:12 PM

## 2014-01-04 NOTE — Progress Notes (Signed)
Peripherally Inserted Central Catheter/Midline Placement  The IV Nurse has discussed with the patient and/or persons authorized to consent for the patient, the purpose of this procedure and the potential benefits and risks involved with this procedure.  The benefits include less needle sticks, lab draws from the catheter and patient may be discharged home with the catheter.  Risks include, but not limited to, infection, bleeding, blood clot (thrombus formation), and puncture of an artery; nerve damage and irregular heat beat.  Alternatives to this procedure were also discussed.  PICC/Midline Placement Documentation        Timmothy Sours 01/04/2014, 11:08 AM

## 2014-01-04 NOTE — Plan of Care (Signed)
Problem: Consults Goal: Permanent Pacemaker Patient Education (See Patient Education module for education specifics.) Outcome: Completed/Met Date Met:  01/04/14

## 2014-01-04 NOTE — H&P (Signed)
Patient Name: [name] Date of Encounter: 01/04/2014     Principal Problem:   Sepsis due to group B Streptococcus Active Problems:   Thrombocytopenia   Meningitis due to bacterium   Acute renal failure syndrome   Dehydration   LFT elevation   Meningitis, streptococcal   Headache   Pain in joint, lower leg   Brain abscess   Acute respiratory failure with hypoxia   HCAP (healthcare-associated pneumonia)   Bacterial endocarditis   Bilateral edema of lower extremity   Severe aortic insufficiency   Third degree heart block   Endocarditis of aortic valve   Hypoxia   Sepsis   Acute CHF    SUBJECTIVE  No chest pain or sob.   CURRENT MEDS . antiseptic oral rinse  7 mL Mouth Rinse QID  . bisacodyl  10 mg Oral Daily   Or  . bisacodyl  10 mg Rectal Daily  . cefTRIAXone (ROCEPHIN)  IV  2 g Intravenous Q12H  . chlorhexidine  15 mL Mouth Rinse BID  . docusate sodium  200 mg Oral Daily  . furosemide  20 mg Intravenous 3 times per day  . gentamicin  80 mg Intravenous Q8H  . insulin aspart  0-24 Units Subcutaneous 6 times per day  . pantoprazole (PROTONIX) IV  40 mg Intravenous Q24H  . sodium chloride  10 mL Intravenous Q12H  . sodium chloride  10-40 mL Intracatheter Q12H  . sodium chloride  3 mL Intravenous Q12H    OBJECTIVE  Filed Vitals:   01/04/14 0905 01/04/14 1100 01/04/14 1129 01/04/14 1200  BP:  121/74  118/75  Pulse: 99 107  102  Temp:   99.4 F (37.4 C)   TempSrc:   Oral   Resp:  31  31  Height:      Weight:      SpO2:  92%  94%    Intake/Output Summary (Last 24 hours) at 01/04/14 1301 Last data filed at 01/04/14 1200  Gross per 24 hour  Intake   1130 ml  Output   4475 ml  Net  -3345 ml   Filed Weights   01/02/14 0400 01/03/14 0600 01/04/14 0500  Weight: 214 lb 4.6 oz (97.2 kg) 218 lb 0.6 oz (98.9 kg) 211 lb 13.8 oz (96.1 kg)    PHYSICAL EXAM  General: Pleasant, well appearing young man, NAD. Neuro: Alert and oriented X 3. Moves all  extremities spontaneously. Psych: Normal affect. HEENT:  Normal  Neck: Supple without bruits or JVD. Lungs:  Resp regular and unlabored, CTA. Heart: RRR no s3, s4, or murmurs. Abdomen: Soft, non-tender, non-distended, BS + x 4.  Extremities: No clubbing, cyanosis or edema. DP/PT/Radials 2+ and equal bilaterally.  Accessory Clinical Findings  CBC  Recent Labs  01/03/14 0513 01/04/14 0500  WBC 10.1 8.0  HGB 8.5* 8.9*  HCT 25.9* 27.5*  MCV 88.1 87.6  PLT 195 247   Basic Metabolic Panel  Recent Labs  01/03/14 0513 01/04/14 0500  NA 136* 135*  K 4.0 4.0  CL 97 98  CO2 27 26  GLUCOSE 116* 109*  BUN 11 9  CREATININE 0.87 0.86  CALCIUM 7.7* 8.0*   Liver Function Tests  Recent Labs  01/02/14 0430 01/03/14 0513  AST 177* 97*  ALT 285* 199*  ALKPHOS 72 83  BILITOT 1.4* 1.1  PROT 4.9* 5.1*  ALBUMIN 1.7* 1.6*   No results for input(s): LIPASE, AMYLASE in the last 72 hours. Cardiac Enzymes No results  for input(s): CKTOTAL, CKMB, CKMBINDEX, TROPONINI in the last 72 hours. BNP Invalid input(s): POCBNP D-Dimer No results for input(s): DDIMER in the last 72 hours. Hemoglobin A1C No results for input(s): HGBA1C in the last 72 hours. Fasting Lipid Panel No results for input(s): CHOL, HDL, LDLCALC, TRIG, CHOLHDL, LDLDIRECT in the last 72 hours. Thyroid Function Tests No results for input(s): TSH, T4TOTAL, T3FREE, THYROIDAB in the last 72 hours.  Invalid input(s): FREET3  TEL nsr with complete heart block, s/p temp PM  Radiology/Studies  Ct Head Wo Contrast  12/23/2013   CLINICAL DATA:  5 day history of bilateral temporal region headaches with neck stiffness and vomiting  EXAM: CT HEAD WITHOUT CONTRAST  TECHNIQUE: Contiguous axial images were obtained from the base of the skull through the vertex without intravenous contrast.  COMPARISON:  None.  FINDINGS: The ventricles are normal in size and configuration. There is no mass, hemorrhage, extra-axial fluid  collection, or midline shift. The gray-white compartments are normal. No appreciable acute infarct. The bony calvarium appears intact. The mastoid air cells are clear. There is paranasal sinus disease in the left frontal sinus region.  IMPRESSION: Left frontal sinusitis. No intracranial lesion. No intracranial mass, hemorrhage, or extra-axial fluid. No gray old -white compartment lesions appreciated.   Electronically Signed   By: Bretta Bang M.D.   On: 12/23/2013 11:52   Mr Laqueta Jean ZO Contrast  12/24/2013   CLINICAL DATA:  Headache with fever and neck stiffness. Group B Streptococcus grown from cerebrospinal fluid. Elevated CSF white count. Initial encounter.  EXAM: MRI HEAD WITHOUT AND WITH CONTRAST  TECHNIQUE: Multiplanar, multiecho pulse sequences of the brain and surrounding structures were obtained without and with intravenous contrast.  CONTRAST:  20mL MULTIHANCE GADOBENATE DIMEGLUMINE 529 MG/ML IV SOLN  COMPARISON:  12/23/2013 CT head is negative  FINDINGS: The study is significantly motion degraded but overall diagnostic.  There are numerous 2-3 mm size areas of restricted diffusion predominantly at the gray-white junction of the cerebral hemispheres, at least one in the LEFT cerebellum, associated with peripheral collections of fluid or pus having restricted diffusion in the subarachnoid spaces. Suspected bifrontal interhemispheric cortical edema of a fairly symmetric nature as seen on axial T2 and FLAIR images (image 15 series 5 and 6). Post infusion, definite foci of abnormal parenchymal enhancement difficult to confirm, due to patient motion. Suspected linear enhancement over the RIGHT frontal lobe. Findings are consistent with bacterial meningitis, and widespread microabscesses. Continued surveillance warranted.  Normal cerebral volume. No definite white matter disease. Flow voids are preserved. Tiny focus of chronic hemorrhage on susceptibility weighted imaging RIGHT caudate, nonacute.  No  midline abnormality. Significant LEFT frontal sinus disease, with air-fluid level also noted in the LEFT maxillary sinus. Negative orbits. Negative mastoids.  IMPRESSION: Findings consistent with bacterial meningitis, cerebritis, and scattered microabscesses. Delineation of the extent of disease difficult due to patient motion.  A call is in to the ordering provider.   Electronically Signed   By: Davonna Belling M.D.   On: 12/24/2013 20:22   Dg Chest Port 1 View  01/04/2014   CLINICAL DATA:  Cardiac surgery.  EXAM: PORTABLE CHEST - 1 VIEW  COMPARISON:  01/03/2014.  FINDINGS: Left ostiomeatal stable position. Prior median sternotomy. Cardiomegaly. Improving pulmonary venous congestion and bilateral pulmonary infiltrates. Mild residual left lateral pulmonary edema. Small left pleural effusion. No pneumothorax.  IMPRESSION: 1. Left IJ line in stable position. 2. Prior median sternotomy. Cardiomegaly with improving pulmonary venous congestion and bilateral pulmonary infiltrates  consistent with improving pulmonary edema. Residual mild pulmonary edema remains. Small stable left pleural effusion.   Electronically Signed   By: Maisie Fushomas  Register   On: 01/04/2014 07:47   Dg Chest Port 1 View  01/03/2014   CLINICAL DATA:  Post AVR  EXAM: PORTABLE CHEST - 1 VIEW  COMPARISON:  Portable exam 0500 hr compared to 01/02/2014  FINDINGS: RIGHT jugular central venous catheter with tip projecting over SVC.  Interval removal of Swan-Ganz catheter.  Enlargement of cardiac silhouette with pulmonary vascular congestion.  Perihilar infiltrates likely pulmonary edema, asymmetrically greater on LEFT.  No definite pleural effusion or pneumothorax.  IMPRESSION: Persistent pulmonary edema, little changed.   Electronically Signed   By: Ulyses SouthwardMark  Boles M.D.   On: 01/03/2014 07:16   Dg Chest Port 1 View  01/02/2014   CLINICAL DATA:  Status post aortic valve replacement.  EXAM: PORTABLE CHEST - 1 VIEW  COMPARISON:  01/01/2014  FINDINGS:  Endotracheal tube, enteric tube, and mediastinal drain have been removed. Left jugular central venous catheter remains with tip projecting over the mid SVC. Right jugular Swan-Ganz catheter tip projects over the right ventricle, unchanged. Cardiac silhouette remains enlarged. Patchy bilateral lung opacities, greatest in the perihilar regions, have slightly worsened since the prior study. No large pleural effusion or pneumothorax is identified.  IMPRESSION: 1. Interval removal of support devices as above. 2. Bilateral lung opacities, suggestive of pulmonary edema and slightly worsened since the prior study.   Electronically Signed   By: Sebastian AcheAllen  Grady   On: 01/02/2014 08:02   Dg Chest Port 1 View  01/01/2014   CLINICAL DATA:  Ventilated patient.  Endocarditis.  EXAM: PORTABLE CHEST - 1 VIEW  COMPARISON:  12/31/2013.  FINDINGS: Patchy bilateral airspace opacity is similar to the previous day's study allowing for differences in technique. Likely pulmonary edema. Additional lung base opacity is likely atelectasis, also unchanged.  Endotracheal tube, left internal jugular central venous line and nasogastric tube are stable well-positioned. There is a stable tube projecting at the left lung base.  Swan-Ganz catheter is stable. The tip projects in the right ventricle.  No pneumothorax.  IMPRESSION: 1. Stable appearance from the previous day's study. 2. Persistent patchy areas of lung opacity in a central distribution. Pulmonary edema is suspected. No new lung opacities. Support apparatus is stable   Electronically Signed   By: Amie Portlandavid  Ormond M.D.   On: 01/01/2014 08:28   Dg Chest Port 1 View  12/31/2013   CLINICAL DATA:  28 year old male with endocarditis and CNS infection. Initial encounter.  EXAM: PORTABLE CHEST - 1 VIEW  COMPARISON:  12/30/2013 and earlier.  FINDINGS: Portable AP semi upright view at at 0559 hrs. Stable endotracheal tube tip just below the clavicles. Enteric tube courses to the abdomen as before.  Right IJ approach catheter terminates in the RV apex region as before. Midline mediastinal or chest tube is stable. Stable left IJ central line.  Continued low lung volumes with patchy and confluent bilateral pulmonary opacity superimposed on dense lower lobe collapse or consolidation. No pneumothorax. No large pleural effusion.  IMPRESSION: 1.  Stable lines and tubes. 2. Stable ventilation with widespread pulmonary opacity and dense lower lobe collapse or consolidation.   Electronically Signed   By: Augusto GambleLee  Hall M.D.   On: 12/31/2013 07:49   Dg Chest Port 1 View  12/30/2013   CLINICAL DATA:  Endocarditis of aortic valve.  EXAM: PORTABLE CHEST - 1 VIEW  COMPARISON:  December 29, 2013.  FINDINGS: Endotracheal  tube is in grossly good position with distal tip 2 cm above the carina. Nasogastric tube is seen entering the stomach. Left internal jugular catheter is unchanged in position with distal tip in expected position the SVC. There is been interval placement of right internal jugular catheter with distal tip projected over the right ventricle. Stable bilateral perihilar opacities are noted concerning for edema or pneumonia.  IMPRESSION: Endotracheal and nasogastric tubes in grossly good position. Interval placement of right internal jugular catheter with distal tip projected over expected position of right ventricle. Stable bilateral perihilar opacities are noted.   Electronically Signed   By: Roque Lias M.D.   On: 12/30/2013 17:54   Dg Chest Port 1 View  12/29/2013   CLINICAL DATA:  Central line placement. Nasogastric tube placement. Meningitis.  EXAM: PORTABLE CHEST - 1 VIEW  COMPARISON:  12/29/2013 at 4:50 p.m.  FINDINGS: Endotracheal tube tip 2.1 cm above the carina. Left IJ line tip: SVC. Nasogastric tube extends into the stomach view beyond the inferior margin of today' s radiograph.  Stable bilateral airspace opacities, low lung volumes, and cardiomegaly.  IMPRESSION: 1. The nasogastric tube has been  repositioned and now extends down into the stomach and beyond the inferior margin of today's image compatible with satisfactory positioning.   Electronically Signed   By: Herbie Baltimore M.D.   On: 12/29/2013 18:47   Dg Chest Port 1 View  12/29/2013   CLINICAL DATA:  Nasogastric tube placement. ET tube placement. Acute Respiratory failure.  EXAM: PORTABLE ABDOMEN - 1 VIEW; PORTABLE CHEST - 1 VIEW  COMPARISON:  12/29/2013  FINDINGS: Chest x-ray:  The endotracheal tube is an good position, 2.8 cm above the carina. The NG tube is coiled back on itself in the mid esophagus and should be retracted and replaced. Persistent cardiac enlargement and diffuse airspace process in the lungs with bilateral effusions.  Abdomen:  The endotracheal tube is coiled in the mid esophagus and should be removed and replaced. The abdominal bowel gas pattern is unremarkable.  IMPRESSION: The endotracheal tube is in good position.  The NG tube is coiled in the upper esophagus.  Persistent diffuse airspace process.  Normal abdominal radiograph.   Electronically Signed   By: Loralie Champagne M.D.   On: 12/29/2013 17:17   Dg Chest Port 1 View  12/29/2013   CLINICAL DATA:  Intubated, check endotracheal tube position  EXAM: PORTABLE CHEST - 1 VIEW  COMPARISON:  Prior chest x-ray earlier today at 10:48 a.m.  FINDINGS: Interval intubation. The endotracheal tube tip is 1.5 cm above the carina.  Stable cardiomegaly. Diffuse bilateral interstitial and airspace opacities. Developing right pleural effusion appears more evident than on seen on the radiographs from earlier today.  IMPRESSION: 1. The tip of the endotracheal tube is 1.5 cm above the carina. 2. Persistent diffuse bilateral interstitial and airspace opacities concerning for pulmonary edema. Massive aspiration, multifocal pneumonia or alveolar pulmonary hemorrhage could appear similar in the appropriate clinical scenarios. 3. Developing right pleural effusion.   Electronically Signed    By: Malachy Moan M.D.   On: 12/29/2013 15:37   Dg Chest Port 1 View  12/29/2013   CLINICAL DATA:  Tachypnea, hypoxia, shortness of breath.  EXAM: PORTABLE CHEST - 1 VIEW  COMPARISON:  12/28/2013.  FINDINGS: Trachea is midline. Heart size is grossly stable. There is diffuse bilateral airspace disease, new or progressive from 12/28/2013. No definite pleural fluid.  IMPRESSION: New or progressive diffuse bilateral airspace disease is indicative of pulmonary  edema. Difficult to exclude aspiration.   Electronically Signed   By: Leanna Battles M.D.   On: 12/29/2013 12:26   Dg Chest Port 1 View  12/28/2013   CLINICAL DATA:  Shortness of breath.  EXAM: PORTABLE CHEST - 1 VIEW  COMPARISON:  None.  FINDINGS: Shallow inspiration. The heart size and mediastinal contours are within normal limits. Both lungs are clear. The visualized skeletal structures are unremarkable.  IMPRESSION: No active disease.   Electronically Signed   By: Burman Nieves M.D.   On: 12/28/2013 01:45   Dg Abd Portable 1v  12/29/2013   CLINICAL DATA:  Nasogastric tube placement  EXAM: PORTABLE ABDOMEN - 1 VIEW  COMPARISON:  Portable exam 1812 hr compared to 12/29/2013  FINDINGS: Nasogastric tube tip projects over distal gastric antrum.  Nonobstructive bowel gas pattern.  Bones unremarkable.  IMPRESSION: Tip of nasogastric tube projects over distal gastric antrum.   Electronically Signed   By: Ulyses Southward M.D.   On: 12/29/2013 18:38   Dg Abd Portable 1v  12/29/2013   CLINICAL DATA:  Nasogastric tube placement. ET tube placement. Acute Respiratory failure.  EXAM: PORTABLE ABDOMEN - 1 VIEW; PORTABLE CHEST - 1 VIEW  COMPARISON:  12/29/2013  FINDINGS: Chest x-ray:  The endotracheal tube is an good position, 2.8 cm above the carina. The NG tube is coiled back on itself in the mid esophagus and should be retracted and replaced. Persistent cardiac enlargement and diffuse airspace process in the lungs with bilateral effusions.  Abdomen:  The  endotracheal tube is coiled in the mid esophagus and should be removed and replaced. The abdominal bowel gas pattern is unremarkable.  IMPRESSION: The endotracheal tube is in good position.  The NG tube is coiled in the upper esophagus.  Persistent diffuse airspace process.  Normal abdominal radiograph.   Electronically Signed   By: Loralie Champagne M.D.   On: 12/29/2013 17:17    ASSESSMENT AND PLAN  1. CHB after AVR/ aortic root replacement 2. Broup B strep endocarditis complicated by abscess  Rec: the patient is doing well. Will plan to proceed with PPM insertion. The risk/benefits/goals/expectations of PPM insertion have been discussed and he wishes to proceed.  Gregg Taylor,M.D.  01/04/2014 1:01 PM

## 2014-01-04 NOTE — Progress Notes (Signed)
SLP Cancellation Note  Patient Details Name: Korbyn Symmes MRN: [id number] DOB: 1985-03-14   Cancelled treatment:       Reason Eval/Treat Not Completed: Patient at procedure or test/unavailable Ferdinand Lango MA, CCC-SLP [phone]   Ferdinand Lango Meryl 01/04/2014, 3:21 PM

## 2014-01-04 NOTE — Progress Notes (Signed)
[facility]    Date of Admission:  12/23/2013   Total days of antibiotics 13        Day 13 ceftriaxone        Day 4 gent                   ID: [name] is a 28 y.o. 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 Principal Problem:   Sepsis due to group B Streptococcus Active Problems:   Thrombocytopenia   Meningitis due to bacterium   Acute renal failure syndrome   Dehydration   LFT elevation   Meningitis, streptococcal   Headache   Pain in joint, lower leg   Brain abscess   Acute respiratory failure with hypoxia   HCAP (healthcare-associated pneumonia)   Bacterial endocarditis   Bilateral edema of lower extremity   Severe aortic insufficiency   Third degree heart block   Endocarditis of aortic valve   Hypoxia   Sepsis   Acute CHF    Subjective: Afebrile, ambulating with PT 300 ft. This morning had right sided picc line placed  Medications:  . antiseptic oral rinse  7 mL Mouth Rinse QID  . bisacodyl  10 mg Oral Daily   Or  . bisacodyl  10 mg Rectal Daily  . cefTRIAXone (ROCEPHIN)  IV  2 g Intravenous Q12H  . chlorhexidine  15 mL Mouth Rinse BID  . docusate sodium  200 mg Oral Daily  . furosemide  20 mg Intravenous 3 times per day  . gentamicin  80 mg Intravenous Q8H  . insulin aspart  0-24 Units Subcutaneous 6 times per day  . pantoprazole (PROTONIX) IV  40 mg Intravenous Q24H  . sodium chloride  10 mL Intravenous Q12H  . sodium chloride  3 mL Intravenous Q12H    Objective: Vital signs in last 24 hours: Temp:  [97.3 F (36.3 C)-98.6 F (37 C)] 98.2 F (36.8 C) (11/23 0824) Pulse Rate:  [99-111] 99 (11/23 0905) Resp:  [19-34] 30 (11/23 0700) BP: (96-128)/(53-88) 103/57 mmHg (11/23 0700) SpO2:  [91 %-99 %] 94 %  (11/23 0700) Weight:  [211 lb 13.8 oz (96.1 kg)] 211 lb 13.8 oz (96.1 kg) (11/23 0500) Physical Exam  Constitutional:  He appears well-developed and well-nourished. No distress.  HENT: PERRLA, EOMI, no scleral icterus, left ij in place Mouth/Throat: dry oral mucosa pulm = Chest bilateral rhonchi Chest= midline dressing in place Cv: regular, trace murmur heard Abd= normal bowel sounds, soft Ext: No edema Skin = no rash   Lab Results  Recent Labs  01/03/14 0513 01/04/14 0500  WBC 10.1 8.0  HGB 8.5* 8.9*  HCT 25.9* 27.5*  NA 136* 135*  K 4.0 4.0  CL 97 98  CO2 27 26  BUN 11 9  CREATININE 0.87 0.86   Liver Panel  Recent Labs  01/02/14 0430 01/03/14 0513  PROT 4.9* 5.1*  ALBUMIN 1.7* 1.6*  AST 177* 97*  ALT 285* 199*  ALKPHOS 72 83  BILITOT 1.4* 1.1    Microbiology: 11/18 tissue cx showing gpc in prs on gram stain. No growth at 72hr  Studies/Results: Dg Chest Port 1 View  01/04/2014   CLINICAL DATA:  Cardiac surgery.  EXAM: PORTABLE CHEST - 1 VIEW  COMPARISON:  01/03/2014.  FINDINGS: Left ostiomeatal stable position. Prior median sternotomy. Cardiomegaly. Improving pulmonary venous congestion and bilateral pulmonary infiltrates. Mild residual left lateral pulmonary edema. Small left pleural effusion. No pneumothorax.  IMPRESSION: 1. Left IJ line in stable position. 2. Prior median sternotomy. Cardiomegaly with improving pulmonary venous congestion and bilateral pulmonary infiltrates consistent with improving pulmonary edema. Residual mild pulmonary edema remains. Small stable left pleural effusion.   Electronically Signed   By: Maisie Fus  Register   On: 01/04/2014 07:47   Dg Chest Port 1 View  01/03/2014   CLINICAL DATA:  Post AVR  EXAM: PORTABLE CHEST - 1 VIEW  COMPARISON:  Portable exam 0500 hr compared to 01/02/2014  FINDINGS: RIGHT jugular central venous catheter with tip projecting over SVC.  Interval removal of Swan-Ganz catheter.  Enlargement of cardiac silhouette  with pulmonary vascular congestion.  Perihilar infiltrates likely pulmonary edema, asymmetrically greater on LEFT.  No definite pleural effusion or pneumothorax.  IMPRESSION: Persistent pulmonary edema, little changed.   Electronically Signed   By: Ulyses Southward M.D.   On: 01/03/2014 07:16     Assessment/Plan: Severe infective endocarditis with group b strep pod #1  2/2 native AV endocarditis c/b meningitis and brain abscesses POD#1 S/P aortic root replacement, AVR, TV repair, and repair of Aorto-R Atrial fistula, and found to have  3rd degree AV block 2/2  infection and surgical repair.  - will need 6 wks of  ceftriaxone 2gm IV q 12hr.  Plus gentamicin for 14 days, currently on day 4/14 and tolerating it from renal perspective. Will use day 11/19 as day of 1 of antibiotics -  path to send valve to 16S DNA testing to identify group b strep from valvular tissue - gbs bacterial meningitis/brain abscess = covered with ceftriaxone 2gm iv q 12 - patient to get pacemaker for 3rd degree block per cardiology tomorrow   [facility] Cell: [phone] Pager: [phone]  01/04/2014, 9:50 AM

## 2014-01-04 NOTE — Progress Notes (Signed)
Patient Name: [name] Date of Encounter: 01/04/2014   Principal Problem:   Sepsis due to group B Streptococcus Active Problems:   Meningitis due to bacterium   Meningitis, streptococcal   Acute respiratory failure with hypoxia   Bacterial endocarditis   Severe aortic insufficiency   Third degree heart block   Endocarditis of aortic valve   Hypoxia   Sepsis   Acute CHF   Acute renal failure syndrome   Pain in joint, lower leg   Brain abscess   HCAP (healthcare-associated pneumonia)   Thrombocytopenia   Dehydration   LFT elevation   Headache   Bilateral edema of lower extremity   SUBJECTIVE  C/O headache.  Remains pacer dependent.  Plan for PICC line today.  CURRENT MEDS . antiseptic oral rinse  7 mL Mouth Rinse QID  . bisacodyl  10 mg Oral Daily   Or  . bisacodyl  10 mg Rectal Daily  . cefTRIAXone (ROCEPHIN)  IV  2 g Intravenous Q12H  . chlorhexidine  15 mL Mouth Rinse BID  . docusate sodium  200 mg Oral Daily  . furosemide  20 mg Intravenous 3 times per day  . gentamicin  80 mg Intravenous Q8H  . insulin aspart  0-24 Units Subcutaneous 6 times per day  . pantoprazole (PROTONIX) IV  40 mg Intravenous Q24H  . sodium chloride  10 mL Intravenous Q12H  . sodium chloride  3 mL Intravenous Q12H    OBJECTIVE  Filed Vitals:   01/04/14 0500 01/04/14 0700 01/04/14 0824 01/04/14 0905  BP:  103/57    Pulse:  103  99  Temp:   98.2 F (36.8 C)   TempSrc:   Oral   Resp:  30    Height:      Weight: 211 lb 13.8 oz (96.1 kg)     SpO2:  94%      Intake/Output Summary (Last 24 hours) at 01/04/14 0910 Last data filed at 01/04/14 0900  Gross per 24 hour  Intake   1400 ml  Output   4435 ml  Net  -3035 ml   Filed Weights   01/02/14 0400 01/03/14 0600 01/04/14 0500  Weight: 214 lb 4.6 oz (97.2 kg) 218 lb 0.6 oz (98.9 kg) 211 lb 13.8 oz (96.1 kg)    PHYSICAL EXAM  General: Pleasant, NAD. Neuro: Alert and oriented X 3. Moves all extremities  spontaneously. Psych: Normal affect. HEENT:  Normal  Neck: Supple without bruits.  JVP ~ 12. Lungs:  Resp regular and unlabored, diminished @ bases bilat. Heart: RRR no s3, s4, 2/6 SEM RUSB. Abdomen: Soft, non-tender, non-distended, BS + x 4.  Extremities: No clubbing, cyanosis.  1+ bilat LE edema. DP/PT/Radials 2+ and equal bilaterally.  Accessory Clinical Findings  CBC  Recent Labs  01/03/14 0513 01/04/14 0500  WBC 10.1 8.0  HGB 8.5* 8.9*  HCT 25.9* 27.5*  MCV 88.1 87.6  PLT 195 247   Basic Metabolic Panel  Recent Labs  01/03/14 0513 01/04/14 0500  NA 136* 135*  K 4.0 4.0  CL 97 98  CO2 27 26  GLUCOSE 116* 109*  BUN 11 9  CREATININE 0.87 0.86  CALCIUM 7.7* 8.0*   Liver Function Tests  Recent Labs  01/02/14 0430 01/03/14 0513  AST 177* 97*  ALT 285* 199*  ALKPHOS 72 83  BILITOT 1.4* 1.1  PROT 4.9* 5.1*  ALBUMIN 1.7* 1.6*   TELE  V paced  [facility]iology[facility]  [facility] [address]  01[facility]/2014   CLINICAL DATA:  Cardiac surgery.  EXAM: PORTABLE CHEST - 1 VIEW  COMPARISON:  01/03/2014.  FINDINGS: Left ostiomeatal stable position. Prior median sternotomy. Cardiomegaly. Improving pulmonary venous congestion and bilateral pulmonary infiltrates. Mild residual left lateral pulmonary edema. Small left pleural effusion. No pneumothorax.  IMPRESSION: 1. Left IJ line in stable position. 2. Prior median sternotomy. Cardiomegaly with improving pulmonary venous congestion and bilateral pulmonary infiltrates consistent with improving pulmonary edema. Residual mild pulmonary edema remains. Small stable left pleural effusion.   Electronically Signed   By: Maisie Fus  Register   On: 01/04/2014 07:47   ASSESSMENT AND PLAN  1.  Complete heart block:  Remains pacer dependent with CHB.  Afebrile this AM with WBC trending down.  Plan for PICC on right today and if stable, PPM tomorrow.  Discussed risks and benefits and pt is willing to proceed.  2.  Group b strep  meningitis/bacteremia/Severe AI s/p AVR/Tricuspid repair/closure of PFO :  Hemodynamically stable.  Abx per ID.  3.  Acute CHF:  In setting of #2.  Nl LV fxn on echo 11/18.  Still with mild volume overload and receiving IV lasix.  Signed, Nicolasa Ducking NP  EP Attending  Patient seen and examined. See my additional note. Will plan PPM on 11/23.   Leonia Reeves.D.

## 2014-01-04 NOTE — CV Procedure (Signed)
SURGEON:  [name], MD     PREPROCEDURE DIAGNOSIS:  Symptomatic Bradycardia due to complete heart block    POSTPROCEDURE DIAGNOSIS:  Same as preprocedure diagnosis     PROCEDURES:   1. Pacemaker implantation.     INTRODUCTION: [name] is a 28 y.o. male  with a history of bradycardia who presents today for pacemaker implantation.  The patient underwent AVR and replacement of his proximal aorta and developed CHB, post op.  The patient therefore presents today for pacemaker implantation.     DESCRIPTION OF PROCEDURE:  Informed written consent was obtained, and   the patient was brought to the electrophysiology lab in a fasting state.  The patient required no sedation for the procedure today.  The patients left chest was prepped and draped in the usual sterile fashion by the EP lab staff. The skin overlying the left deltopectoral region was infiltrated with lidocaine for local analgesia.  A 4-cm incision was made over the left deltopectoral region.  A left subcutaneous pacemaker pocket was fashioned using a combination of sharp and blunt dissection. Electrocautery was required to assure hemostasis.      RA/RV Lead Placement: The left axillary vein was therefore directly visualized and cannulated.  Through the left axillary vein, a Medtronic [id number] (serial number [id number]) right atrial lead and a Medtronic 5076 (serial number [id number]) right ventricular lead were advanced with fluoroscopic visualization into the right atrial appendage and right ventricular apical septal positions respectively.  Initial atrial lead P- waves measured 2.8 mV with impedance of 500 ohms and a threshold of 1.6 V at 0.5 msec.  Right ventricular lead R-waves measured 16 mV with an impedance of 818 ohms and a threshold of 0.5 V at 0.5 msec.  Both leads were secured to the pectoralis fascia using #2-0 silk over the suture sleeves.   Device Placement:  The leads were then connected to a Medtronic (serial number  [id number]) pacemaker.  The pocket was irrigated with copious gentamicin solution.  The pacemaker was then placed into the pocket.  The pocket was then closed in 2 layers with 2.0 Vicryl suture for the subcutaneous and subcuticular layers.  Steri-Strips and a sterile dressing were then applied.  There were no early apparent complications.     CONCLUSIONS:   1. Successful implantation of a Medtronic dual-chamber pacemaker for symptomatic bradycardia due to CHB  2. No early apparent complications.           [name], MD 01/04/2014 3:29 PM

## 2014-01-04 NOTE — Plan of Care (Signed)
Problem: Phase II - Intermediate Post-Op Goal: Advance Diet Outcome: Completed/Met Date Met:  01/04/14 Goal: Activity Progressed Outcome: Completed/Met Date Met:  01/04/14

## 2014-01-04 NOTE — Progress Notes (Signed)
Patient ID: [name], male   DOB: 1985/12/28, 28 y.o.   MRN: [id number] TCTS DAILY ICU PROGRESS NOTE                   [address]            [place] [id number]          [phone]   5 Days Post-Op Procedure(s) (LRB): ASCENDING AORTIC ROOT REPLACEMENT with 88mm HOMOGRAFT, CLOSURE OF AORTIC TO RIGHT ATRIAL FISTULA, CLOSURE OF PATENT FORAMEN OVALE (N/A) REPAIR OF TRICUSPID VALVE LEAFLET (N/A) INTRAOPERATIVE TRANSESOPHAGEAL ECHOCARDIOGRAM (N/A)  Total Length of Stay:  LOS: 12 days   Subjective: Walking around the unit with PT, neuro inact  Objective: Vital signs in last 24 hours: Temp:  [97.3 F (36.3 C)-98.6 F (37 C)] 98 F (36.7 C) (11/23 0400) Pulse Rate:  [99-111] 102 (11/23 0400) Cardiac Rhythm:  [-] A-V Sequential paced (11/23 0400) Resp:  [18-34] 28 (11/23 0400) BP: (93-128)/(50-88) 96/67 mmHg (11/23 0400) SpO2:  [91 %-99 %] 94 % (11/23 0400) Weight:  [211 lb 13.8 oz (96.1 kg)] 211 lb 13.8 oz (96.1 kg) (11/23 0500)  Filed Weights   01/02/14 0400 01/03/14 0600 01/04/14 0500  Weight: 214 lb 4.6 oz (97.2 kg) 218 lb 0.6 oz (98.9 kg) 211 lb 13.8 oz (96.1 kg)    Weight change: -6 lb 2.8 oz (-2.8 kg)   Hemodynamic parameters for last 24 hours: CVP:  [5 mmHg-8 mmHg] 5 mmHg  Intake/Output from previous day: 11/22 0701 - 11/23 0700 In: 1360 [P.O.:480; I.V.:480; IV Piggyback:400] Out: 4910 [Urine:4910]  Intake/Output this shift:    Current Meds: Scheduled Meds: . antiseptic oral rinse  7 mL Mouth Rinse QID  . bisacodyl  10 mg Oral Daily   Or  . bisacodyl  10 mg Rectal Daily  . cefTRIAXone (ROCEPHIN)  IV  2 g Intravenous Q12H  . chlorhexidine  15 mL Mouth Rinse BID  . docusate sodium  200 mg Oral Daily  . furosemide  20 mg Intravenous 3 times per day  . gentamicin  80 mg Intravenous Q8H  . insulin aspart  0-24 Units Subcutaneous 6 times per day  . pantoprazole (PROTONIX) IV  40 mg Intravenous Q24H  . sodium chloride  10 mL Intravenous Q12H    . sodium chloride  3 mL Intravenous Q12H   Continuous Infusions: . sodium chloride 20 mL/hr at 01/04/14 0700  . sodium chloride    . sodium chloride 20 mL/hr (12/30/13 1645)  . dexmedetomidine Stopped (01/01/14 1500)  . lactated ringers Stopped (01/02/14 1200)  . phenylephrine (NEO-SYNEPHRINE) Adult infusion Stopped (01/02/14 0005)   PRN Meds:.Place/Maintain arterial line **AND** sodium chloride, Place/Maintain arterial line **AND** sodium chloride, metoprolol, morphine injection, ondansetron (ZOFRAN) IV, sodium chloride, sodium chloride  General appearance: alert, cooperative and no distress Neurologic: intact Heart: regular rate and rhythm, S1, S2 normal, no murmur, click, rub or gallop Lungs: diminished breath sounds bibasilar Abdomen: soft, non-tender; bowel sounds normal; no masses,  no organomegaly Extremities: extremities normal, atraumatic, no cyanosis or edema and Homans sign is negative, no sign of DVT Wound: sternum stable, wound intact  Lab Results: CBC: Recent Labs  01/03/14 0513 01/04/14 0500  WBC 10.1 8.0  HGB 8.5* 8.9*  HCT 25.9* 27.5*  PLT 195 247   BMET:  Recent Labs  01/03/14 0513 01/04/14 0500  NA 136* 135*  K 4.0 4.0  CL 97 98  CO2 27 26  GLUCOSE 116* 109*  BUN  11 9  CREATININE 0.87 0.86  CALCIUM 7.7* 8.0*    PT/INR:  Recent Labs  01/02/14 0430  LABPROT 17.5*  INR 1.42   Radiology: Dg Chest Port 1 View  01/04/2014   CLINICAL DATA:  Cardiac surgery.  EXAM: PORTABLE CHEST - 1 VIEW  COMPARISON:  01/03/2014.  FINDINGS: Left ostiomeatal stable position. Prior median sternotomy. Cardiomegaly. Improving pulmonary venous congestion and bilateral pulmonary infiltrates. Mild residual left lateral pulmonary edema. Small left pleural effusion. No pneumothorax.  IMPRESSION: 1. Left IJ line in stable position. 2. Prior median sternotomy. Cardiomegaly with improving pulmonary venous congestion and bilateral pulmonary infiltrates consistent with improving  pulmonary edema. Residual mild pulmonary edema remains. Small stable left pleural effusion.   Electronically Signed   By: Maisie Fushomas  Register   On: 01/04/2014 07:47   Dg Chest Port 1 View  01/03/2014   CLINICAL DATA:  Post AVR  EXAM: PORTABLE CHEST - 1 VIEW  COMPARISON:  Portable exam 0500 hr compared to 01/02/2014  FINDINGS: RIGHT jugular central venous catheter with tip projecting over SVC.  Interval removal of Swan-Ganz catheter.  Enlargement of cardiac silhouette with pulmonary vascular congestion.  Perihilar infiltrates likely pulmonary edema, asymmetrically greater on LEFT.  No definite pleural effusion or pneumothorax.  IMPRESSION: Persistent pulmonary edema, little changed.   Electronically Signed   By: Ulyses SouthwardMark  Boles M.D.   On: 01/03/2014 07:16     Assessment/Plan: S/P Procedure(s) (LRB): ASCENDING AORTIC ROOT REPLACEMENT with 23mm HOMOGRAFT, CLOSURE OF AORTIC TO RIGHT ATRIAL FISTULA, CLOSURE OF PATENT FORAMEN OVALE (N/A) REPAIR OF TRICUSPID VALVE LEAFLET (N/A) INTRAOPERATIVE TRANSESOPHAGEAL ECHOCARDIOGRAM (N/A) waiting for PPM, pic line today, pacer Tuesday Transfer to stepdown after pacer in place    [name] 01/04/2014 7:58 AM

## 2014-01-05 ENCOUNTER — Inpatient Hospital Stay (HOSPITAL_COMMUNITY): Payer: BC Managed Care – PPO

## 2014-01-05 LAB — GLUCOSE, CAPILLARY
GLUCOSE-CAPILLARY: 92 mg/dL (ref 70–99)
Glucose-Capillary: 121 mg/dL — ABNORMAL HIGH (ref 70–99)
Glucose-Capillary: 114 mg/dL — ABNORMAL HIGH (ref 70–99)
Glucose-Capillary: 93 mg/dL (ref 70–99)
Glucose-Capillary: 114 mg/dL — ABNORMAL HIGH (ref 70–99)
Glucose-Capillary: 91 mg/dL (ref 70–99)

## 2014-01-05 LAB — GENTAMICIN LEVEL, TROUGH: Gentamicin Trough: 1.2 ug/mL (ref 0.5–2.0)

## 2014-01-05 LAB — BASIC METABOLIC PANEL
Anion gap: 13 (ref 5–15)
BUN: 8 mg/dL (ref 6–23)
CO2: 26 mEq/L (ref 19–32)
CREATININE: 0.98 mg/dL (ref 0.50–1.35)
Calcium: 8.1 mg/dL — ABNORMAL LOW (ref 8.4–10.5)
Chloride: 95 mEq/L — ABNORMAL LOW (ref 96–112)
GLUCOSE: 94 mg/dL (ref 70–99)
Potassium: 4.4 mEq/L (ref 3.7–5.3)
Sodium: 134 mEq/L — ABNORMAL LOW (ref 137–147)

## 2014-01-05 LAB — CBC
HCT: 26 % — ABNORMAL LOW (ref 39.0–52.0)
HEMOGLOBIN: 8.5 g/dL — AB (ref 13.0–17.0)
MCH: 29.2 pg (ref 26.0–34.0)
MCHC: 32.7 g/dL (ref 30.0–36.0)
MCV: 89.3 fL (ref 78.0–100.0)
Platelets: 254 10*3/uL (ref 150–400)
RBC: 2.91 MIL/uL — ABNORMAL LOW (ref 4.22–5.81)
RDW: 14.6 % (ref 11.5–15.5)
WBC: 7.2 10*3/uL (ref 4.0–10.5)

## 2014-01-05 MED ORDER — MOVING RIGHT ALONG BOOK
Freq: Once | Status: AC
  Administered 2014-01-05: 1
  Filled 2014-01-05: qty 1

## 2014-01-05 MED ORDER — BISACODYL 5 MG PO TBEC: 10.0000 mg | DELAYED_RELEASE_TABLET | Freq: Every day | ORAL | Status: DC | PRN

## 2014-01-05 MED ORDER — ASPIRIN EC 325 MG PO TBEC
325.0000 mg | DELAYED_RELEASE_TABLET | Freq: Every day | ORAL | Status: DC
  Administered 2014-01-05 – 2014-01-07 (×3): 325 mg via ORAL
  Filled 2014-01-05 (×3): qty 1

## 2014-01-05 MED ORDER — BISACODYL 10 MG RE SUPP: 10.0000 mg | Freq: Every day | RECTAL | Status: DC | PRN

## 2014-01-05 MED ORDER — ACETAMINOPHEN 325 MG PO TABS: 650.0000 mg | ORAL_TABLET | Freq: Four times a day (QID) | ORAL | Status: DC | PRN

## 2014-01-05 MED ORDER — PANTOPRAZOLE SODIUM 40 MG PO TBEC
40.0000 mg | DELAYED_RELEASE_TABLET | Freq: Every day | ORAL | Status: DC
  Administered 2014-01-06 – 2014-01-07 (×2): 40 mg via ORAL
  Filled 2014-01-05 (×2): qty 1

## 2014-01-05 MED ORDER — POTASSIUM CHLORIDE CRYS ER 20 MEQ PO TBCR
20.0000 meq | EXTENDED_RELEASE_TABLET | Freq: Every day | ORAL | Status: DC
  Administered 2014-01-05 – 2014-01-06 (×2): 20 meq via ORAL
  Filled 2014-01-05 (×3): qty 1

## 2014-01-05 MED ORDER — DOCUSATE SODIUM 100 MG PO CAPS
200.0000 mg | ORAL_CAPSULE | Freq: Every day | ORAL | Status: DC
  Administered 2014-01-06: 200 mg via ORAL
  Filled 2014-01-05 (×2): qty 2

## 2014-01-05 MED ORDER — OXYCODONE HCL 5 MG PO TABS
5.0000 mg | ORAL_TABLET | ORAL | Status: DC | PRN
  Administered 2014-01-05 – 2014-01-07 (×2): 10 mg via ORAL
  Filled 2014-01-05 (×2): qty 2

## 2014-01-05 MED ORDER — TRAMADOL HCL 50 MG PO TABS: 50.0000 mg | ORAL_TABLET | ORAL | Status: DC | PRN

## 2014-01-05 MED ORDER — SODIUM CHLORIDE 0.9 % IJ SOLN: 3.0000 mL | Freq: Two times a day (BID) | INTRAMUSCULAR | Status: DC

## 2014-01-05 MED ORDER — FUROSEMIDE 20 MG PO TABS
20.0000 mg | ORAL_TABLET | Freq: Every day | ORAL | Status: DC
  Administered 2014-01-05 – 2014-01-06 (×2): 20 mg via ORAL
  Filled 2014-01-05 (×3): qty 1

## 2014-01-05 MED ORDER — SODIUM CHLORIDE 0.9 % IJ SOLN: 3.0000 mL | INTRAMUSCULAR | Status: DC | PRN

## 2014-01-05 MED ORDER — ONDANSETRON HCL 4 MG PO TABS: 4.0000 mg | ORAL_TABLET | Freq: Four times a day (QID) | ORAL | Status: DC | PRN

## 2014-01-05 MED ORDER — SODIUM CHLORIDE 0.9 % IV SOLN: 250.0000 mL | INTRAVENOUS | Status: DC | PRN

## 2014-01-05 MED ORDER — ONDANSETRON HCL 4 MG/2ML IJ SOLN: 4.0000 mg | Freq: Four times a day (QID) | INTRAMUSCULAR | Status: DC | PRN

## 2014-01-05 MED ORDER — ENOXAPARIN SODIUM 30 MG/0.3ML ~~LOC~~ SOLN
30.0000 mg | SUBCUTANEOUS | Status: DC
  Administered 2014-01-05 – 2014-01-06 (×2): 30 mg via SUBCUTANEOUS
  Filled 2014-01-05 (×3): qty 0.3

## 2014-01-05 MED ORDER — GENTAMICIN IN SALINE 1.6-0.9 MG/ML-% IV SOLN
80.0000 mg | Freq: Two times a day (BID) | INTRAVENOUS | Status: DC
  Administered 2014-01-06 – 2014-01-07 (×4): 80 mg via INTRAVENOUS
  Filled 2014-01-05 (×7): qty 50

## 2014-01-05 NOTE — Plan of Care (Signed)
Problem: Consults Goal: Diabetes Guidelines if Diabetic/Glucose > 140 If diabetic or lab glucose is > 140 mg/dl - Initiate Diabetes/Hyperglycemia Guidelines & Document Interventions  Outcome: Completed/Met Date Met:  01/05/14

## 2014-01-05 NOTE — Progress Notes (Signed)
Speech Language Pathology Treatment: Dysphagia  Patient Details Name: [name] MRN: [id number] DOB: 07/20/1985 Today's Date: 01/05/2014 Time: 8676-1950 SLP Time Calculation (min) (ACUTE ONLY): 8 min  Assessment / Plan / Recommendation Clinical Impression  F/u after 11/12 swallow evaluation.  Pt has been tolerating a regular diet with thin liquids with excellent toleration.  No s/s of aspiration observed during today's session, even when taxed with large, successive liquid boluses.  No further f/u for swallowing is warranted.  Will sign off.  Note cognitive deficits per PT progress note.  D/W pt/girlfriend - if cognition does not clear next several days, pt may benefit from cognitive-linguistic assessment.       HPI HPI: 28 yo man, admitted 11/11 with meningitis c/b transaminitis and DIC. LP showed Group B strep and MRI brain showed embolic process. 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 Developed progressive dyspnea and respiratory distress 11/17 with evolving (new) B infiltrates on CXR. Intubated. 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 To OR for emergency repair on 11/18. Head CT 12/23/13 >> L frontal sinusitis, no acute findings.  MRI brain 11/12 >> bacterial meningitis, cerebritis, scattered microabscesses .  Acute resp failure in setting of bilateral pulm infiltrates; intubated 11/17-11/20 (self-extubated.)  Pacemaker placed 11/23.    Pertinent Vitals Pain Assessment: 0-10 Pain Descriptors / Indicators: Aching  SLP Plan  All goals met;Discharge SLP treatment due to (comment)    Recommendations Diet recommendations: Regular;Thin liquid Liquids provided via: Straw Medication Administration: Whole meds with liquid Supervision: Patient able to self feed Compensations: Slow rate;Small sips/bites Postural Changes and/or Swallow Maneuvers: Seated upright 90 degrees              Oral Care Recommendations: Oral care BID Follow up  Recommendations: None Plan: All goals met;Discharge SLP treatment due to (comment)   [name] L. Tivis Ringer, [place] CCC/SLP Pager [phone]      [name] 01/05/2014, 10:26 AM

## 2014-01-05 NOTE — Progress Notes (Signed)
Patient ID: [name], male   DOB: 1985/06/17, 28 y.o.   MRN: [id number] TCTS DAILY ICU PROGRESS NOTE                   [address]            [name] [address]          [phone]   1 Day Post-Op Procedure(s) (LRB): PERMANENT PACEMAKER INSERTION (N/A)  Total Length of Stay:  LOS: 13 days   Subjective: Walking in unit , pacer placement yesterday  Objective: Vital signs in last 24 hours: Temp:  [97.5 F (36.4 C)-100 F (37.8 C)] 97.5 F (36.4 C) (11/24 1200) Pulse Rate:  [95-117] 103 (11/24 1000) Cardiac Rhythm:  [-] A-V Sequential paced (11/24 0800) Resp:  [5-48] 22 (11/24 1000) BP: (90-123)/(51-93) 113/66 mmHg (11/24 1000) SpO2:  [90 %-99 %] 97 % (11/24 1000) Weight:  [202 lb 13.2 oz (92 kg)] 202 lb 13.2 oz (92 kg) (11/24 0500)  Filed Weights   01/03/14 0600 01/04/14 0500 01/05/14 0500  Weight: 218 lb 0.6 oz (98.9 kg) 211 lb 13.8 oz (96.1 kg) 202 lb 13.2 oz (92 kg)    Weight change: -9 lb 0.6 oz (-4.1 kg)   Hemodynamic parameters for last 24 hours:    Intake/Output from previous day: 11/23 0701 - 11/24 0700 In: 1638 [P.O.:540; I.V.:648; IV Piggyback:450] Out: 4950 [Urine:4950]  Intake/Output this shift: Total I/O In: 20 [I.V.:20] Out: 325 [Urine:325]  Current Meds: Scheduled Meds: . bisacodyl  10 mg Oral Daily   Or  . bisacodyl  10 mg Rectal Daily  . cefTRIAXone (ROCEPHIN)  IV  2 g Intravenous Q12H  . docusate sodium  200 mg Oral Daily  . furosemide  20 mg Intravenous 3 times per day  . gentamicin  80 mg Intravenous Q8H  . heparin subcutaneous  5,000 Units Subcutaneous 3 times per day  . insulin aspart  0-24 Units Subcutaneous 6 times per day  . pantoprazole (PROTONIX) IV  40 mg Intravenous Q24H  . sodium chloride  10 mL Intravenous Q12H  . sodium chloride  10-40 mL Intracatheter Q12H  . sodium chloride  3 mL Intravenous Q12H   Continuous Infusions: . sodium chloride 20 mL/hr at 01/05/14 0800  . sodium chloride    . sodium  chloride 20 mL/hr (12/30/13 1645)  . lactated ringers Stopped (01/02/14 1200)   PRN Meds:.acetaminophen, metoprolol, morphine injection, ondansetron (ZOFRAN) IV, sodium chloride, sodium chloride, sodium chloride  General appearance: alert and cooperative Neurologic: intact Heart: regular rate and rhythm, S1, S2 normal, no murmur, click, rub or gallop Lungs: diminished breath sounds bibasilar Abdomen: soft, non-tender; bowel sounds normal; no masses,  no organomegaly Extremities: extremities normal, atraumatic, no cyanosis or edema and Homans sign is negative, no sign of DVT Wound: sternum stable  Lab Results: CBC: Recent Labs  01/04/14 0500 01/05/14 0421  WBC 8.0 7.2  HGB 8.9* 8.5*  HCT 27.5* 26.0*  PLT 247 254   BMET:  Recent Labs  01/04/14 0500 01/05/14 0421  NA 135* 134*  K 4.0 4.4  CL 98 95*  CO2 26 26  GLUCOSE 109* 94  BUN 9 8  CREATININE 0.86 0.98  CALCIUM 8.0* 8.1*    PT/INR: No results for input(s): LABPROT, INR in the last 72 hours. Radiology: Dg Chest 2 View  01/05/2014   ADDENDUM REPORT: 01/05/2014 07:49  ADDENDUM: The lateral film does reveal increased density in the retrocardiac region on the left  consistent with a small amount of pleural fluid and/or atelectasis.   Electronically Signed   By: David  [place]   On: 01/05/2014 07:49   01/05/2014   CLINICAL DATA:  Status post permanent pacemaker placement  EXAM: CHEST  2 VIEW  COMPARISON:  Portable chest x-ray of January 04, 2014  FINDINGS: The lungs are mildly hypoinflated. There is no pneumothorax or significant pleural effusion. The left hemidiaphragm remains obscured. The pulmonary vascularity remains engorged. The cardiac silhouette remains enlarged. The patient has undergone previous CABG. Seven intact sternal wires are present. The permanent pacemaker is in appropriate position radiographically.  IMPRESSION: There is no postprocedure complication following placement of the permanent pacemaker. There are  stable changes of CHF.  Electronically Signed: By: David  [place] On: 01/05/2014 07:46   Dg Chest Port 1 View  01/04/2014   CLINICAL DATA:  Cardiac surgery.  EXAM: PORTABLE CHEST - 1 VIEW  COMPARISON:  01/03/2014.  FINDINGS: Left ostiomeatal stable position. Prior median sternotomy. Cardiomegaly. Improving pulmonary venous congestion and bilateral pulmonary infiltrates. Mild residual left lateral pulmonary edema. Small left pleural effusion. No pneumothorax.  IMPRESSION: 1. Left IJ line in stable position. 2. Prior median sternotomy. Cardiomegaly with improving pulmonary venous congestion and bilateral pulmonary infiltrates consistent with improving pulmonary edema. Residual mild pulmonary edema remains. Small stable left pleural effusion.   Electronically Signed   By: Maisie Fushomas  Register   On: 01/04/2014 07:47     Assessment/Plan: S/P Procedure(s) (LRB): PERMANENT PACEMAKER INSERTION (N/A) Mobilize Diuresis Plan for transfer to step-down: see transfer orders     Hubbert Landrigan B 01/05/2014 1:08 PM

## 2014-01-05 NOTE — Plan of Care (Signed)
Problem: Phase II Progression Outcomes Goal: Obtain order to discontinue catheter if appropriate Outcome: Completed/Met Date Met:  01/05/14

## 2014-01-05 NOTE — Plan of Care (Signed)
Problem: Phase III Progression Outcomes Goal: Foley discontinued Outcome: Completed/Met Date Met:  01/05/14     

## 2014-01-05 NOTE — Plan of Care (Signed)
Problem: Phase I Progression Outcomes Goal: Hemodynamically stable Outcome: Completed/Met Date Met:  01/05/14

## 2014-01-05 NOTE — Plan of Care (Signed)
Problem: Phase I Progression Outcomes Goal: Voiding-avoid urinary catheter unless indicated Outcome: Completed/Met Date Met:  01/05/14

## 2014-01-05 NOTE — Progress Notes (Addendum)
[facility]    Date of Admission:  12/23/2013   Total days of antibiotics 14        Day 14 ceftriaxone        Day 5 gent        1 dose of vanco                   ID: [name] is a 28 y.o. 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 Principal Problem:   Sepsis due to group B Streptococcus Active Problems:   Thrombocytopenia   Meningitis due to bacterium   Acute renal failure syndrome   Dehydration   LFT elevation   Meningitis, streptococcal   Headache   Pain in joint, lower leg   Brain abscess   Acute respiratory failure with hypoxia   HCAP (healthcare-associated pneumonia)   Bacterial endocarditis   Bilateral edema of lower extremity   Severe aortic insufficiency   Third degree heart block   Endocarditis of aortic valve   Hypoxia   Sepsis   Acute CHF    Subjective: Afebrile, tmax 100 yesterday. He had permanent dual chamber pacemaker placed. He has ambulated around the floor without shortness of breath  Medications:  . bisacodyl  10 mg Oral Daily   Or  . bisacodyl  10 mg Rectal Daily  . cefTRIAXone (ROCEPHIN)  IV  2 g Intravenous Q12H  . docusate sodium  200 mg Oral Daily  . furosemide  20 mg Intravenous 3 times per day  . gentamicin  80 mg Intravenous Q8H  . heparin subcutaneous  5,000 Units Subcutaneous 3 times per day  . insulin aspart  0-24 Units Subcutaneous 6 times per day  . pantoprazole (PROTONIX) IV  40 mg Intravenous Q24H  . sodium chloride  10 mL Intravenous Q12H  . sodium chloride  10-40 mL Intracatheter Q12H  . sodium chloride  3 mL Intravenous Q12H    Objective: Vital signs in last 24 hours: Temp:  [98.4 F (36.9 C)-100 F (37.8 C)] 98.5 F (36.9 C) (11/24 0400) Pulse Rate:  [95-117] 110 (11/24  0800) Resp:  [5-48] 20 (11/24 0800) BP: (90-123)/(51-93) 118/76 mmHg (11/24 0839) SpO2:  [90 %-99 %] 99 % (11/24 0839) Weight:  [202 lb 13.2 oz (92 kg)] 202 lb 13.2 oz (92 kg) (11/24 0500) Physical Exam  Constitutional:  He appears well-developed and well-nourished. No distress.  HENT: PERRLA, EOMI, no scleral icterus Chest wall = pacemaker  site bandaged on right side Mouth/Throat: dry oral mucosa pulm = CTAB no rhonchi Chest= midline dressing in place Cv: regular, trace murmur heard Abd= normal bowel sounds, soft Ext: No edema Skin = no rash   Lab Results  Recent Labs  01/04/14 0500 01/05/14 0421  WBC 8.0 7.2  HGB 8.9* 8.5*  HCT 27.5* 26.0*  NA 135* 134*  K 4.0 4.4  CL 98 95*  CO2 26 26  BUN 9 8  CREATININE 0.86 0.98   Liver Panel  Recent Labs  01/03/14 0513  PROT 5.1*  ALBUMIN 1.6*  AST 97*  ALT 199*  ALKPHOS 83  BILITOT 1.1    Microbiology: 11/18 tissue cx showing gpc in prs on gram stain. NGTD  Studies/Results: Dg Chest 2 View  01/05/2014  ADDENDUM REPORT: 01/05/2014 07:49  ADDENDUM: The lateral film does reveal increased density in the retrocardiac region on the left consistent with a small amount of pleural fluid and/or atelectasis.   Electronically Signed   By: David  [place]   On: 01/05/2014 07:49   01/05/2014   CLINICAL DATA:  Status post permanent pacemaker placement  EXAM: CHEST  2 VIEW  COMPARISON:  Portable chest x-ray of January 04, 2014  FINDINGS: The lungs are mildly hypoinflated. There is no pneumothorax or significant pleural effusion. The left hemidiaphragm remains obscured. The pulmonary vascularity remains engorged. The cardiac silhouette remains enlarged. The patient has undergone previous CABG. Seven intact sternal wires are present. The permanent pacemaker is in appropriate position radiographically.  IMPRESSION: There is no postprocedure complication following placement of the permanent pacemaker. There are stable changes of CHF.   Electronically Signed: By: David  [place] On: 01/05/2014 07:46   Dg Chest Port 1 View  01/04/2014   CLINICAL DATA:  Cardiac surgery.  EXAM: PORTABLE CHEST - 1 VIEW  COMPARISON:  01/03/2014.  FINDINGS: Left ostiomeatal stable position. Prior median sternotomy. Cardiomegaly. Improving pulmonary venous congestion and bilateral pulmonary infiltrates. Mild residual left lateral pulmonary edema. Small left pleural effusion. No pneumothorax.  IMPRESSION: 1. Left IJ line in stable position. 2. Prior median sternotomy. Cardiomegaly with improving pulmonary venous congestion and bilateral pulmonary infiltrates consistent with improving pulmonary edema. Residual mild pulmonary edema remains. Small stable left pleural effusion.   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  Plus gentamicin for 14 days, currently on day 5/14 and tolerating it from renal perspective. Will use day 11/19 as day of 1 of antibiotics. - last day of gent will be dec 3rd - last day of ceftriaxone will be dec 31st. -  path sent valve to 16S DNA testing to identify group b strep from valvular tissue - will get [facility] to see if labs show that he had gbs bacteremia  - gbs bacterial meningitis/brain abscess = covered with ceftriaxone 2gm iv q 12 for 6 wk, and may convert over to oral antibiotics pending repeat imaging  - for home health, he will need weekly cbc with diff, bmp.   We will see back in ID clinic in 4 wks  Will sign off    Lorrin Bodner, [facility] Cell: [phone] Pager:  [phone]  01/05/2014, 8:55 AM

## 2014-01-05 NOTE — Plan of Care (Signed)
Problem: Phase III Progression Outcomes Goal: Voiding independently Outcome: Completed/Met Date Met:  01/05/14

## 2014-01-05 NOTE — Plan of Care (Signed)
Problem: Phase I Progression Outcomes Goal: Procedure Consent complete Outcome: Completed/Met Date Met:  01/04/14 Goal: Lab values on chart Outcome: Completed/Met Date Met:  01/04/14 In [facility] Goal: No Coumadin, ASA, or antiplatelet meds/48 hrs Outcome: Completed/Met Date Met:  01/04/14 Goal: IV antibiotics available for on-call Outcome: Completed/Met Date Met:  01/04/14  sent to cath lab after patient left. Had not been delivered prior to departure. Goal: Other Phase I Outcomes/Goals Outcome: Not Applicable Date Met:  54/65/03  Problem: Phase II Progression Outcomes Goal: Antibiotics sent with patient Outcome: Completed/Met Date Met:  01/04/14 Goal: Irrigation solution sent with patient Outcome: Completed/Met Date Met:  01/04/14 Sent to pharmacy

## 2014-01-05 NOTE — Plan of Care (Signed)
Problem: Acute Rehab PT Goals(only PT should resolve) Goal: Pt Will Go Up/Down Stairs Outcome: Completed/Met Date Met:  01/05/14

## 2014-01-05 NOTE — Progress Notes (Signed)
ANTIBIOTIC CONSULT NOTE - FOLLOW UP  Pharmacy Consult for Gentamicin Indication: Group B strep endocarditis   No Known Allergies  Patient Measurements: Height: 5\' 5"  (165.1 cm) Weight: 202 lb 13.2 oz (92 kg) IBW/kg (Calculated) : 61.5 Adjusted Body Weight:    Vital Signs: Temp: 97.5 F (36.4 C) (11/24 1200) Temp Source: Oral (11/24 1200) BP: 113/66 mmHg (11/24 1000) Pulse Rate: 103 (11/24 1000) Intake/Output from previous day: 11/23 0701 - 11/24 0700 In: 1638 [P.O.:540; I.V.:648; IV Piggyback:450] Out: 4950 [Urine:4950] Intake/Output from this shift: Total I/O In: 240 [I.V.:140; IV Piggyback:100] Out: 325 [Urine:325]  Labs:  Recent Labs  01/03/14 0513 01/03/14 1741 01/04/14 0500 01/05/14 0421  WBC 10.1  --  8.0 7.2  HGB 8.5* 9.5* 8.9* 8.5*  PLT 195  --  247 254  CREATININE 0.87  --  0.86 0.98   Estimated Creatinine Clearance: 119.1 mL/min (by C-G formula based on Cr of 0.98).  Recent Labs  01/05/14 1125  GENTTROUGH 1.2     Microbiology: Recent Results (from the past 720 hour(s))  CSF culture     Status: None   Collection Time: 12/23/13 12:20 PM  Result Value Ref Range Status   Specimen Description CSF  Final   Special Requests NONE  Final   Gram Stain   Final    CYOSPIN NO WBC SEEN NO ORGANISMS SEEN Performed at [facility]    Culture   Final    FEW GROUP B STREP(S.AGALACTIAE)ISOLATED Note: CRITICAL RESULT CALLED TO, READ BACK BY AND VERIFIED WITH: STEPHANIE SHAW @ 0744 ON [id number] BY [facility] Performed at [facility]    Report Status 12/25/2013 FINAL  Final   Organism ID, Bacteria GROUP B STREP(S.AGALACTIAE)ISOLATED  Final      Susceptibility   Group b strep(s.agalactiae)isolated - MIC*    ERYTHROMYCIN >=8 RESISTANT Resistant     PENICILLIN <=0.06 SENSITIVE Sensitive     VANCOMYCIN 0.5 SENSITIVE Sensitive     AMPICILLIN <=0.25 SENSITIVE Sensitive     * FEW GROUP B STREP(S.AGALACTIAE)ISOLATED  MRSA PCR Screening     Status: None    Collection Time: 12/23/13  5:27 PM  Result Value Ref Range Status   MRSA by PCR NEGATIVE NEGATIVE Final    Comment:        The GeneXpert MRSA Assay (FDA approved for NASAL specimens only), is one component of a comprehensive MRSA colonization surveillance program. It is not intended to diagnose MRSA infection nor to guide or monitor treatment for MRSA infections.   Culture, blood (routine x 2)     Status: None   Collection Time: 12/24/13 10:50 AM  Result Value Ref Range Status   Specimen Description BLOOD RIGHT HAND  Final   Special Requests   Final    BOTTLES DRAWN AEROBIC AND ANAEROBIC 6CC AER 5CC ANA   Culture  Setup Time   Final    12/24/2013 18:48 Performed at [facility]    Culture   Final    NO GROWTH 5 DAYS Performed at [facility]    Report Status 12/30/2013 FINAL  Final  Culture, blood (routine x 2)     Status: None   Collection Time: 12/24/13 10:58 AM  Result Value Ref Range Status   Specimen Description BLOOD LEFT ANTECUBITAL  Final   Special Requests BOTTLES DRAWN AEROBIC AND ANAEROBIC 10CC  Final   Culture  Setup Time   Final    12/24/2013 18:48 Performed at [facility]  Culture   Final    NO GROWTH 5 DAYS Performed at [facility]    Report Status 12/30/2013 FINAL  Final  Culture, blood (routine x 2)     Status: None   Collection Time: 12/28/13 11:50 AM  Result Value Ref Range Status   Specimen Description BLOOD LEFT ANTECUBITAL  Final   Special Requests BOTTLES DRAWN AEROBIC AND ANAEROBIC 10CC  Final   Culture  Setup Time   Final    12/28/2013 16:46 Performed at [facility]    Culture   Final    NO GROWTH 5 DAYS Performed at [facility]    Report Status 01/03/2014 FINAL  Final  Culture, blood (routine x 2)     Status: None   Collection Time: 12/28/13 12:05 PM  Result Value Ref Range Status   Specimen Description BLOOD LEFT HAND  Final   Special Requests BOTTLES DRAWN AEROBIC ONLY  10CC  Final   Culture  Setup Time   Final    12/28/2013 16:46 Performed at [facility]    Culture   Final    NO GROWTH 5 DAYS Performed at [facility]    Report Status 01/03/2014 FINAL  Final  Culture, Urine     Status: None   Collection Time: 12/29/13  3:32 PM  Result Value Ref Range Status   Specimen Description URINE, CATHETERIZED  Final   Special Requests NONE  Final   Culture  Setup Time   Final    12/29/2013 21:29 Performed at [facility]    Colony Count NO GROWTH Performed at [facility]   Final   Culture NO GROWTH Performed at [facility]   Final   Report Status 12/30/2013 FINAL  Final  Tissue culture     Status: None   Collection Time: 12/30/13 11:18 AM  Result Value Ref Range Status   Specimen Description TISSUE VALVE  Final   Special Requests A) AORTIC VALVE W/ VEGETATION  Final   Gram Stain   Final    ABUNDANT WBC PRESENT,BOTH PMN AND MONONUCLEAR NO SQUAMOUS EPITHELIAL CELLS SEEN ABUNDANT GRAM POSITIVE COCCI IN PAIRS IN CHAINS Performed at [facility]    Culture   Final    NO GROWTH 3 DAYS Performed at [facility]    Report Status 01/04/2014 FINAL  Final  Fungus Culture with Smear     Status: None (Preliminary result)   Collection Time: 12/30/13 11:18 AM  Result Value Ref Range Status   Specimen Description TISSUE VALVE  Final   Special Requests A) AORTIC VALVE W/ VEGETATION  Final   Fungal Smear   Final    NO YEAST OR FUNGAL ELEMENTS SEEN Performed at [facility]    Culture   Final    CULTURE IN PROGRESS FOR FOUR WEEKS Performed at [facility]    Report Status PENDING  Incomplete  Gram stain     Status: None   Collection Time: 12/30/13 11:18 AM  Result Value Ref Range Status   Specimen Description TISSUE VALVE  Final   Special Requests A) AORTIC VALVE W/ VEGETATION  Final   Gram Stain   Final    ABUNDANT WBC PRESENT,BOTH PMN AND MONONUCLEAR ABUNDANT GRAM  POSITIVE COCCI IN PAIRS IN CHAINS    Report Status 12/30/2013 FINAL  Final  Tissue culture     Status: None   Collection Time: 12/30/13 11:20 AM  Result Value Ref Range Status  Specimen Description TISSUE OTHER  Final   Special Requests B) RIGHT ATRIUM W/ VEGETATION  Final   Gram Stain   Final    MODERATE WBC PRESENT,BOTH PMN AND MONONUCLEAR NO SQUAMOUS EPITHELIAL CELLS SEEN RARE GRAM POSITIVE COCCI IN PAIRS [facility]    Culture   Final    NO GROWTH 3 DAYS [facility]    Report Status 01/04/2014 FINAL  Final  Fungus Culture with Smear     Status: None (Preliminary result)   Collection Time: 12/30/13 11:20 AM  Result Value Ref Range Status   Specimen Description TISSUE OTHER  Final   Special Requests B) RIGHT ATRIUM W/ VEGETATION  Final   Fungal Smear   Final    NO YEAST OR FUNGAL ELEMENTS SEEN [facility]    Culture   Final    CULTURE IN PROGRESS FOR FOUR WEEKS [facility]    Report Status PENDING  Incomplete  Gram stain     Status: None   Collection Time: 12/30/13 11:20 AM  Result Value Ref Range Status   Specimen Description TISSUE OTHER  Final   Special Requests B) RIGHT ATRIUM W/ VEGETATION  Final   Gram Stain   Final    MODERATE WBC PRESENT,BOTH PMN AND MONONUCLEAR RARE GRAM POSITIVE COCCI IN PAIRS    Report Status 12/30/2013 FINAL  Final  Culture, respiratory (NON-Expectorated)     Status: None   Collection Time: 01/01/14 10:50 AM  Result Value Ref Range Status   Specimen Description TRACHEAL ASPIRATE  Final   Special Requests Normal  Final   Gram Stain   Final    MODERATE WBC PRESENT,BOTH PMN AND MONONUCLEAR RARE SQUAMOUS EPITHELIAL CELLS PRESENT NO ORGANISMS SEEN [facility]    Culture   Final    NO GROWTH 2 DAYS [facility]    Report Status 01/03/2014 FINAL  Final    Anti-infectives    Start     Dose/Rate Route  Frequency Ordered Stop   01/04/14 2000  vancomycin (VANCOCIN) IVPB 1000 mg/200 mL premix     1,000 mg200 mL/hr over 60 Minutes Intravenous Every 12 hours 01/04/14 1544 01/04/14 2326   01/04/14 1415  gentamicin (GARAMYCIN) 80 mg in sodium chloride irrigation 0.9 % 500 mL irrigation  Status:  Discontinued     80 mg Irrigation On call 01/04/14 1310 01/04/14 1544   01/04/14 1415  ceFAZolin (ANCEF) IVPB 2 g/50 mL premix  Status:  Discontinued     2 g100 mL/hr over 30 Minutes Intravenous On call 01/04/14 1310 01/04/14 1544   01/01/14 1200  gentamicin (GARAMYCIN) IVPB 80 mg     80 mg100 mL/hr over 30 Minutes Intravenous Every 8 hours 01/01/14 1011     12/30/13 2200  cefTRIAXone (ROCEPHIN) 2 g in dextrose 5 % 50 mL IVPB - Premix     2 g100 mL/hr over 30 Minutes Intravenous Every 12 hours 12/30/13 1932     12/30/13 0800  vancomycin (VANCOCIN) 1,500 mg in sodium chloride 0.9 % 500 mL IVPB  Status:  Discontinued     1,500 mg250 mL/hr over 120 Minutes Intravenous Every 12 hours 12/29/13 1948 12/30/13 0745   12/30/13 0800  vancomycin (VANCOCIN) IVPB 1000 mg/200 mL premix  Status:  Discontinued     1,000 mg200 mL/hr over 60 Minutes Intravenous Every 12 hours 12/30/13 0745 12/31/13 1355   12/30/13 0200  vancomycin (VANCOCIN) 1,500  mg in sodium chloride 0.9 % 500 mL IVPB  Status:  Discontinued     1,500 mg250 mL/hr over 120 Minutes Intravenous Every 12 hours 12/29/13 1326 12/29/13 1948   12/29/13 2030  vancomycin (VANCOCIN) 1,250 mg in sodium chloride 0.9 % 250 mL IVPB     1,250 mg166.7 mL/hr over 90 Minutes Intravenous To Surgery 12/29/13 2025 12/30/13 0930   12/29/13 2030  cefUROXime (ZINACEF) 1.5 g in dextrose 5 % 50 mL IVPB     1.5 g100 mL/hr over 30 Minutes Intravenous To Surgery 12/29/13 2025 12/30/13 1523   12/29/13 2030  cefUROXime (ZINACEF) 750 mg in dextrose 5 % 50 mL IVPB  Status:  Discontinued     750 mg100 mL/hr over 30 Minutes Intravenous To Surgery 12/29/13 2025 12/30/13 1638   12/29/13 2000   vancomycin (VANCOCIN) 2,000 mg in sodium chloride 0.9 % 500 mL IVPB     2,000 mg250 mL/hr over 120 Minutes Intravenous  Once 12/29/13 1948 12/29/13 2223   12/29/13 1230  ceFEPIme (MAXIPIME) 2 g in dextrose 5 % 50 mL IVPB  Status:  Discontinued     2 g100 mL/hr over 30 Minutes Intravenous Every 12 hours 12/29/13 1138 12/30/13 1932   12/29/13 1230  vancomycin (VANCOCIN) 2,000 mg in sodium chloride 0.9 % 500 mL IVPB  Status:  Discontinued     2,000 mg250 mL/hr over 120 Minutes Intravenous  Once 12/29/13 1138 12/29/13 2023   12/24/13 0600  vancomycin (VANCOCIN) 1,250 mg in sodium chloride 0.9 % 250 mL IVPB  Status:  Discontinued     1,250 mg166.7 mL/hr over 90 Minutes Intravenous Every 12 hours 12/23/13 1751 12/24/13 0918   12/24/13 0200  cefTRIAXone (ROCEPHIN) 2 g in dextrose 5 % 50 mL IVPB - Premix  Status:  Discontinued     2 g100 mL/hr over 30 Minutes Intravenous Every 12 hours 12/23/13 1751 12/29/13 1132   12/23/13 1800  vancomycin (VANCOCIN) 1,500 mg in sodium chloride 0.9 % 500 mL IVPB     1,500 mg250 mL/hr over 120 Minutes Intravenous STAT 12/23/13 1749 12/23/13 2326   12/23/13 1800  acyclovir (ZOVIRAX) 615 mg in dextrose 5 % 100 mL IVPB  Status:  Discontinued     10 mg/kg  61.5 kg (Ideal)112.3 mL/hr over 60 Minutes Intravenous 3 times per day 12/23/13 1750 12/24/13 0857   12/23/13 1400  cefTRIAXone (ROCEPHIN) 2 g in dextrose 5 % 50 mL IVPB     2 g100 mL/hr over 30 Minutes Intravenous  Once 12/23/13 1347 12/23/13 1428   12/23/13 1349  cefTRIAXone (ROCEPHIN) 2 G injection    Comments:  Tommi Rumps   : cabinet override      12/23/13 1349 12/24/13 0159      Assessment: 28 YOM presents to ED with meningitis. Admitted to ICU on 11/17 with worsening work of breathing-possible intubation. ID is following for possible bacteremia with Endocarditis.  ID:   CTX-MD x 6 wks (11/19 = D#1) + Gent-Rx for synergy x 2wks for GBS native AV IE complicated by bacteremia, meningitis, and septic  emboli/brain abscess. ID on board - recom'd MRI: c/w meningitis & widespread microabscesses; s/p CVTS surgery/repair on 11/18.  He did have ARF on admission  - Gent trough 1.2 mcg/mL  Cefepime 11/17>11/17 Vanc 11/11>11/12, 11/17>11/19 CTX 11/11>11/17; 11/18> Acyclovir 11/11 >11/12 Gent 11/20 > (plan x 2 weeks)  Goal of Therapy:  Gentamicin trough level <2 mcg/ml  Plan:  - Cont Rocephin 2g q12h - Change  Gent to 80  mg q12 hours  Thanks for allowing pharmacy to be a part of this patient's care.  Talbert Cage, PharmD Clinical Pharmacist, [phone]  01/05/2014,2:22 PM

## 2014-01-05 NOTE — Plan of Care (Signed)
Problem: Phase II Progression Outcomes Goal: Progress activity as tolerated unless otherwise ordered Outcome: Progressing     

## 2014-01-05 NOTE — Progress Notes (Signed)
Patient ID: [name], male   DOB: 10-04-1985, 28 y.o.   MRN: [id number] EVENING ROUNDS NOTE :     [address]       [place] [address]             [phone]                 1 Day Post-Op Procedure(s) (LRB): PERMANENT PACEMAKER INSERTION (N/A)  Total Length of Stay:  LOS: 13 days  BP 114/77 mmHg  Pulse 114  Temp(Src) 97.8 F (36.6 C) (Oral)  Resp 24  Ht 5\' 5"  (1.651 m)  Wt 202 lb 13.2 oz (92 kg)  BMI 33.75 kg/m2  SpO2 91%  .Intake/Output      11/23 0701 - 11/24 0700 11/24 0701 - 11/25 0700   P.O. 540    I.V. (mL/kg) 648 (7) 160 (1.7)   IV Piggyback 450 100   Total Intake(mL/kg) 1638 (17.8) 260 (2.8)   Urine (mL/kg/hr) 4950 (2.2) 950 (0.9)   Total Output 4950 950   Net -3312 -690          . sodium chloride 20 mL/hr at 01/05/14 0800  . sodium chloride    . sodium chloride 20 mL/hr (12/30/13 1645)  . lactated ringers Stopped (01/02/14 1200)     Lab Results  Component Value Date   WBC 7.2 01/05/2014   HGB 8.5* 01/05/2014   HCT 26.0* 01/05/2014   PLT 254 01/05/2014   GLUCOSE 94 01/05/2014   TRIG 119 12/29/2013   ALT 199* 01/03/2014   AST 97* 01/03/2014   NA 134* 01/05/2014   K 4.4 01/05/2014   CL 95* 01/05/2014   CREATININE 0.98 01/05/2014   BUN 8 01/05/2014   CO2 26 01/05/2014   INR 1.42 01/02/2014   Waiting for transfer to 2w Stable day  Delight Ovens MD  Beeper [phone] Office [phone] 01/05/2014 6:32 PM

## 2014-01-05 NOTE — Progress Notes (Signed)
NUTRITION FOLLOW UP  DOCUMENTATION CODES Per approved criteria  -Obesity Unspecified   INTERVENTION:  No nutrition intervention warranted at this time RD to continue to follow  NUTRITION DIAGNOSIS: Inadequate oral intake, resolved  New Goal: Pt to meet >/= 90% of their estimated nutrition needs, met  Monitor:  PO intake, weight, labs, I/O's  ASSESSMENT: 28 year old Male transferred from [facility] for suspected meningitis; LP showed Group B strep and MRI brain showed embolic process;TTE showed tricuspid and possibly aortic endocarditis; developed progressive dyspnea and respiratory distress 11/17 with evolving (new) B infiltrates on CXR.   Patient s/p procedures 11/18: ASCENDING AORTIC ROOT REPLACEMENT  REPAIR OF SEPTAL LEAFLET TRICUSPID VALVE CLOSURE OF PATENT FORAMEN OVALE  Patient extubated 11/20.    Diet advanced to Carbohydrate Modified.  S/p bedside swallow evaluation 11/21.  SLP recommending Regular, thin liquid diet.  S/p pacemaker insertion 11/23.  PO intake good at 95% per flowsheet records.    Height: Ht Readings from Last 1 Encounters:  12/29/13 $RemoveB'5\' 5"'atvkwlQJ$  (1.651 m)    Weight: Wt Readings from Last 1 Encounters:  01/05/14 202 lb 13.2 oz (92 kg)    BMI:  Body mass index is 33.75 kg/(m^2).  Re-estimated Nutritional Needs: Kcal: 2000-2200 Protein: 100-110 gm Fluid: per MD  Skin: chest surgical incision   Diet Order: Diet Carb Modified   Intake/Output Summary (Last 24 hours) at 01/05/14 1152 Last data filed at 01/05/14 0800  Gross per 24 hour  Intake   1518 ml  Output   4025 ml  Net  -2507 ml    Labs:   Recent Labs Lab 12/30/13 2223 12/31/13 0400 12/31/13 1730  01/03/14 0513 01/03/14 1741 01/04/14 0500 01/05/14 0421  NA  --  139  --   < > 136* 133* 135* 134*  K  --  5.0  --   < > 4.0 3.5* 4.0 4.4  CL  --  107  --   < > 97  --  98 95*  CO2  --  19  --   < > 27  --  26 26  BUN  --  32*  --   < > 11  --  9 8  CREATININE 1.78*  1.57* 1.23  < > 0.87  --  0.86 0.98  CALCIUM  --  7.3*  --   < > 7.7*  --  8.0* 8.1*  MG 2.4 2.2 2.1  --   --   --   --   --   GLUCOSE  --  103*  --   < > 116* 100* 109* 94  < > = values in this interval not displayed.  CBG (last 3)   Recent Labs  01/04/14 1954 01/04/14 2325 01/05/14 0347  GLUCAP 99 114* 93    Scheduled Meds: . bisacodyl  10 mg Oral Daily   Or  . bisacodyl  10 mg Rectal Daily  . cefTRIAXone (ROCEPHIN)  IV  2 g Intravenous Q12H  . docusate sodium  200 mg Oral Daily  . furosemide  20 mg Intravenous 3 times per day  . gentamicin  80 mg Intravenous Q8H  . heparin subcutaneous  5,000 Units Subcutaneous 3 times per day  . insulin aspart  0-24 Units Subcutaneous 6 times per day  . pantoprazole (PROTONIX) IV  40 mg Intravenous Q24H  . sodium chloride  10 mL Intravenous Q12H  . sodium chloride  10-40 mL Intracatheter Q12H  . sodium chloride  3 mL Intravenous  Q12H    Continuous Infusions: . sodium chloride 20 mL/hr at 01/05/14 0800  . sodium chloride    . sodium chloride 20 mL/hr (12/30/13 1645)  . lactated ringers Stopped (01/02/14 1200)    Past Medical History  Diagnosis Date  . Obesity   . Smokeless tobacco use   . Fracture of left femur   . Meningitis     Group B Strep (November 2015)  . Patent foramen ovale     Closed during procedure on December 30, 2013  . History of open heart surgery     December 30, 2013- ascending aortic root replacement, TEE  . Endocarditis     related to meningitis     Past Surgical History  Procedure Laterality Date  . Ascending aortic root replacement N/A 12/30/2013    Procedure: ASCENDING AORTIC ROOT REPLACEMENT with 30mm HOMOGRAFT, CLOSURE OF AORTIC TO RIGHT ATRIAL FISTULA, CLOSURE OF PATENT FORAMEN OVALE;  Surgeon: Grace Isaac, MD;  Location: [place];  Service: Open Heart Surgery;  Laterality: N/A;  . Tricuspid valve replacement N/A 12/30/2013    Procedure: REPAIR OF TRICUSPID VALVE LEAFLET;  Surgeon: Grace Isaac, MD;  Location: [place];  Service: Open Heart Surgery;  Laterality: N/A;  . Intraoperative transesophageal echocardiogram N/A 12/30/2013    Procedure: INTRAOPERATIVE TRANSESOPHAGEAL ECHOCARDIOGRAM;  Surgeon: Grace Isaac, MD;  Location: [place];  Service: Open Heart Surgery;  Laterality: N/A;    Arthur Holms, RD, LDN Pager #: [phone] After-Hours Pager #: [phone]

## 2014-01-05 NOTE — Progress Notes (Signed)
Physical Therapy Treatment Patient Details Name: [name] MRN: [id number] DOB: 01/11/1986 Today's Date: 01/05/2014    History of Present Illness 28 yo man, admitted 11/11 with meningitis c/b transaminitis and DIC.  LP showed Group B strep and MRI brain showed embolic process. 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 Developed progressive dyspnea and respiratory distress 11/17 with evolving (new) B infiltrates on CXR. Intubated. 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 To OR for emergency repair on 11/18.  Pt self-extubated on 01/01/2014.  s/p permanent pacemaker implantation 01/04/14.    PT Comments    Patient was more alert and mobile during this session than he was on Monday (11/23) session.  He continues to have trouble remembering and adhering to his precautions.  During this session he tried to use his left arm 3 times, needing to be stopped by therapist.  Pt is still showing some deficits in cognition; after being made aware of his room number and asked to repeat it several minutes later he struggled to recall the number.  He had difficulty with simple math tested during ambulation, but he was able to find his way back to his room from the opposite end of the wing.  Patient would continue to benefit from skilled therapy to address deficits in cognition, balance and to improve gait speed in order for the patient to return to his PLOF.   Follow Up Recommendations  Outpatient PT;Supervision/Assistance - 24 hour     Equipment Recommendations  None recommended by PT    Recommendations for Other Services OT consult     Precautions / Restrictions Precautions Precautions: Fall;Sternal;ICD/Pacemaker Precaution Comments: pt requires frequent cueing to maintain precautions Restrictions Weight Bearing Restrictions: No    Mobility  Bed Mobility               General bed mobility comments: pt seated in chair on  arrival  Transfers Overall transfer level: Needs assistance Equipment used: None Transfers: Sit to/from Stand Sit to Stand: Supervision         General transfer comment: pt is slightly off balance, supervision for safety; verbal cues throughout to maintain pacemaker and sternal precauations  Ambulation/Gait Ambulation/Gait assistance: Supervision Ambulation Distance (Feet): 300 Feet Assistive device: None Gait Pattern/deviations: Step-through pattern;Decreased stride length Gait velocity: 1.8 ft/sec; 1120ft/11sec Gait velocity interpretation: Below normal speed for age/gender (1.8 ft/sec) General Gait Details: when asked to increase then decrease gait speed, patient was able to comply but with only small changes in speed; pt reports that he normally walks faster; had minor difficulties and slower speed when asked to turn head left to right while walking   Stairs            Wheelchair Mobility    Modified Rankin (Stroke Patients Only)       Balance Overall balance assessment: Needs assistance (supervision) Sitting-balance support: Feet supported Sitting balance-Leahy Scale: Good     Standing balance support: No upper extremity supported;During functional activity Standing balance-Leahy Scale: Good Standing balance comment: patient is steadier during ambulation today than yesterday    Berg performed with score: 53                Cognition Arousal/Alertness: Awake/alert Behavior During Therapy: WFL for tasks assessed/performed Overall Cognitive Status: No family/caregiver present to determine baseline cognitive functioning       Memory: Decreased recall of precautions;Decreased short-term memory  Exercises      General Comments        Pertinent Vitals/Pain Pain Assessment: 0-10 Pain Descriptors / Indicators: Aching    Home Living                      Prior Function            PT Goals (current goals can now be  found in the care plan section) Progress towards PT goals: Progressing toward goals    Frequency  Min 2X/week    PT Plan Current plan remains appropriate    Co-evaluation             End of Session Equipment Utilized During Treatment: Gait belt;Other (comment) (left arm sling) Activity Tolerance: Patient tolerated treatment well Patient left: with call bell/phone within reach;in chair     Time: 5397-6734 PT Time Calculation (min) (ACUTE ONLY): 28 min  Charges:  $Gait Training: 8-22 mins $Physical Performance Test: 8-22 mins                    G Codes:      Cadden Elizondo SPT 01/05/2014, 11:17 AM

## 2014-01-05 NOTE — Progress Notes (Signed)
SUBJECTIVE: The patient is doing well today.  At this time, he denies chest pain, shortness of breath, or any new concerns.  Status post dual chamber pacemaker implant 01-04-14.   CURRENT MEDICATIONS: . bisacodyl  10 mg Oral Daily   Or  . bisacodyl  10 mg Rectal Daily  . cefTRIAXone (ROCEPHIN)  IV  2 g Intravenous Q12H  . docusate sodium  200 mg Oral Daily  . furosemide  20 mg Intravenous 3 times per day  . gentamicin  80 mg Intravenous Q8H  . heparin subcutaneous  5,000 Units Subcutaneous 3 times per day  . insulin aspart  0-24 Units Subcutaneous 6 times per day  . pantoprazole (PROTONIX) IV  40 mg Intravenous Q24H  . sodium chloride  10 mL Intravenous Q12H  . sodium chloride  10-40 mL Intracatheter Q12H  . sodium chloride  3 mL Intravenous Q12H   . sodium chloride Stopped (01/04/14 1409)  . sodium chloride    . sodium chloride 20 mL/hr (12/30/13 1645)  . lactated ringers Stopped (01/02/14 1200)    OBJECTIVE: Physical Exam: Filed Vitals:   01/05/14 0200 01/05/14 0300 01/05/14 0400 01/05/14 0500  BP: 101/54 111/62 102/64 99/68  Pulse: 101 97 99 95  Temp:   98.5 F (36.9 C)   TempSrc:   Oral   Resp: 23 20 21 17   Height:      Weight:    202 lb 13.2 oz (92 kg)  SpO2: 94% 95% 96% 94%    Intake/Output Summary (Last 24 hours) at 01/05/14 [id number] Last data filed at 01/05/14 0500  Gross per 24 hour  Intake   1438 ml  Output   4950 ml  Net  -3512 ml    Telemetry reveals sinus rhythm/ST with ventricular pacing  GEN- The patient is well appearing, alert and oriented x 3 today.   Head- normocephalic, atraumatic Eyes-  Sclera clear, conjunctiva pink Ears- hearing intact Oropharynx- clear Neck- supple Lungs- Clear to ausculation bilaterally, normal work of breathing Heart- Regular rate and rhythm, 2/6 SEM GI- soft, NT, ND, + BS Extremities- no clubbing, cyanosis, or edema Skin- no rash or lesion, left chest pacemaker pocket with no hematoma/ecchymosis Psych- euthymic  mood, full affect Neuro- strength and sensation are intact  LABS: Basic Metabolic Panel:  Recent Labs  19/14/7811/23/15 0500 01/05/14 0421  NA 135* 134*  K 4.0 4.4  CL 98 95*  CO2 26 26  GLUCOSE 109* 94  BUN 9 8  CREATININE 0.86 0.98  CALCIUM 8.0* 8.1*   Liver Function Tests:  Recent Labs  01/03/14 0513  AST 97*  ALT 199*  ALKPHOS 83  BILITOT 1.1  PROT 5.1*  ALBUMIN 1.6*   CBC:  Recent Labs  01/04/14 0500 01/05/14 0421  WBC 8.0 7.2  HGB 8.9* 8.5*  HCT 27.5* 26.0*  MCV 87.6 89.3  PLT 247 254    RADIOLOGY: CXR official read pending.  Leads in stable position.  No ptx  ASSESSMENT AND PLAN:  Principal Problem:   Sepsis due to group B Streptococcus Active Problems:   Thrombocytopenia   Meningitis due to bacterium   Acute renal failure syndrome   Dehydration   LFT elevation   Meningitis, streptococcal   Headache   Pain in joint, lower leg   Brain abscess   Acute respiratory failure with hypoxia   HCAP (healthcare-associated pneumonia)   Bacterial endocarditis   Bilateral edema of lower extremity   Severe aortic insufficiency   Third degree heart  block   Endocarditis of aortic valve   Hypoxia   Sepsis   Acute CHF  Doing well s/p PPM implant.  CXR reviewed.  TMax 100 yesterday.  Device interrogated and found to be functioning normally.  Wound care, restrictions reviewed with patient.  Will schedule wound check in 10 days.    EP to see as needed while here.  Please call with questions.   Leonia Reeves.D.

## 2014-01-06 ENCOUNTER — Inpatient Hospital Stay (HOSPITAL_COMMUNITY): Payer: BC Managed Care – PPO

## 2014-01-06 LAB — BASIC METABOLIC PANEL
Anion gap: 11 (ref 5–15)
BUN: 8 mg/dL (ref 6–23)
CO2: 29 mEq/L (ref 19–32)
Calcium: 8.6 mg/dL (ref 8.4–10.5)
Chloride: 94 mEq/L — ABNORMAL LOW (ref 96–112)
Creatinine, Ser: 0.93 mg/dL (ref 0.50–1.35)
GFR calc Af Amer: >90 mL/min (ref >90)
GFR calc non Af Amer: >90 mL/min (ref >90)
Glucose, Bld: 100 mg/dL — ABNORMAL HIGH (ref 70–99)
Potassium: 4.7 mEq/L (ref 3.7–5.3)
Sodium: 134 mEq/L — ABNORMAL LOW (ref 137–147)

## 2014-01-06 LAB — CBC
HCT: 29.1 % — ABNORMAL LOW (ref 39.0–52.0)
Hemoglobin: 9.5 g/dL — ABNORMAL LOW (ref 13.0–17.0)
MCH: 29.4 pg (ref 26.0–34.0)
MCHC: 32.6 g/dL (ref 30.0–36.0)
MCV: 90.1 fL (ref 78.0–100.0)
Platelets: 314 10*3/uL (ref 150–400)
RBC: 3.23 MIL/uL — ABNORMAL LOW (ref 4.22–5.81)
RDW: 14.2 % (ref 11.5–15.5)
WBC: 8.1 10*3/uL (ref 4.0–10.5)

## 2014-01-06 MED ORDER — OXYCODONE HCL 5 MG PO TABS: 5.0000 mg | ORAL_TABLET | ORAL | Status: DC | PRN

## 2014-01-06 MED ORDER — CEFTRIAXONE SODIUM IN DEXTROSE 40 MG/ML IV SOLN: 2.0000 g | Freq: Two times a day (BID) | INTRAVENOUS | Status: DC

## 2014-01-06 MED ORDER — GENTAMICIN IN SALINE 1.6-0.9 MG/ML-% IV SOLN: 80.0000 mg | Freq: Two times a day (BID) | INTRAVENOUS | Status: DC

## 2014-01-06 MED ORDER — ASPIRIN 325 MG PO TBEC: 325.0000 mg | DELAYED_RELEASE_TABLET | Freq: Every day | ORAL | Status: DC

## 2014-01-06 NOTE — Progress Notes (Signed)
EPW removed per order. Patient educated concerning protocol and verbalized understanding. Wires intact upon removal.   Will continue to monitor.   [name]

## 2014-01-06 NOTE — Progress Notes (Signed)
CARDIAC REHAB PHASE I   PRE:  Rate/Rhythm: 98 paced  BP:  Supine: 116/65  Sitting:   Standing:    SaO2: 96%RA  MODE:  Ambulation: 550 ft   POST:  Rate/Rhythm: 125 paced  BP:  Supine:   Sitting: 106/62  Standing:    SaO2: 94%RA 1130-1215 Pt walked 550 ft with asst x1 with a little wobbiness. Pt stated he felt as if his balance was getting better. Encouraged significant other to walk with him at home. Education completed with pt who voiced understanding. Discussed CRP 2 and permission given to refer to [facility] program. Encouraged IS. Pt dips and uses vaper so encouraged pt not to do either and gave smoking cessation handout.  To recliner after walk.    Luetta Nutting, RN BSN  01/06/2014 12:24 PM

## 2014-01-06 NOTE — Progress Notes (Addendum)
      [address]       [name] [address]             [phone]        2 Days Post-Op Procedure(s) (LRB): PERMANENT PACEMAKER INSERTION (N/A)  Subjective: Patient "sore all over", but no other complaints.  Objective: Vital signs in last 24 hours: Temp:  [97.3 F (36.3 C)-99.3 F (37.4 C)] 98 F (36.7 C) (11/25 0628) Pulse Rate:  [98-216] 98 (11/25 0628) Cardiac Rhythm:  [-] Ventricular paced (11/24 2020) Resp:  [15-25] 18 (11/25 0628) BP: (90-143)/(60-77) 126/67 mmHg (11/25 0628) SpO2:  [91 %-99 %] 96 % (11/25 0628) Weight:  [195 lb 12.3 oz (88.8 kg)] 195 lb 12.3 oz (88.8 kg) (11/25 0500)  Pre op weight 96.6 kg Current Weight  01/06/14 195 lb 12.3 oz (88.8 kg)      Intake/Output from previous day: 11/24 0701 - 11/25 0700 In: 260 [I.V.:160; IV Piggyback:100] Out: 1475 [Urine:1475]   Physical Exam:  Cardiovascular: RRR, no murmur Pulmonary: Clear to auscultation on right; Slightly diminished at left base Abdomen: Soft, non tender, bowel sounds present. Extremities: Mild bilateral lower extremity edema. Wounds: Clean and dry.  No erythema or signs of infection. Pacemaker dressing is dry and intact  Lab Results: CBC: Recent Labs  01/05/14 0421 01/06/14 0135  WBC 7.2 8.1  HGB 8.5* 9.5*  HCT 26.0* 29.1*  PLT 254 314   BMET:  Recent Labs  01/05/14 0421 01/06/14 0135  NA 134* 134*  K 4.4 4.7  CL 95* 94*  CO2 26 29  GLUCOSE 94 100*  BUN 8 8  CREATININE 0.98 0.93  CALCIUM 8.1* 8.6    PT/INR:  Lab Results  Component Value Date   INR 1.42 01/02/2014   INR 1.58* 01/01/2014   INR 2.76* 12/30/2013   ABG:  INR: Will add last result for INR, ABG once components are confirmed Will add last 4 CBG results once components are confirmed  Assessment/Plan:  1. CV - Previously AV paced for CHB.SR. 2.  Pulmonary - CXR shows low lung volumes, cardiomegaly, bibasilar atelectasis, small left pleural effusion, and improvement in  pulmonary vascular congestion.Encourage incentive spirometer 3. ID-On Rocephin and Gentamycin (gram positive cocci) endocarditis 4.  Acute blood loss anemia - H and H stable at 9.5 and 29.1 5. Remove EPW as has PPM 6. Will discuss timing of discharge disposition with surgeon, possibly in am with [facility]  ZIMMERMAN,DONIELLE MPA-C 01/06/2014,7:34 AM  Making progress, ambulating well  Poss home one to two days Iv antibiotics per ID  I have seen and examined [name] and agree with the above assessment  and plan.  Delight Ovens MD Beeper [phone] Office [phone] 01/06/2014 9:06 AM

## 2014-01-06 NOTE — Care Management Note (Signed)
    Page 1 of 2   01/06/2014     2:26:56 PM CARE MANAGEMENT NOTE 01/06/2014  Patient:  [name], [name]   Account Number:  [id number]  Date Initiated:  12/28/2013  Documentation initiated by:  Junius Creamer  Subjective/Objective Assessment:   adm w thrombocytopenia     Action/Plan:   lives w fam   Anticipated DC Date:  01/07/2014   Anticipated DC Plan:  HOME W HOME HEALTH SERVICES      DC Planning Services  CM consult      [facility] Choice  HOME HEALTH   Choice offered to / List presented to:  C-1 Patient        HH arranged  HH-1 RN  HH-2 PT      [facility] agency  [facility]   Status of service:  In process, will continue to follow Medicare Important Message given?  NO (If response is "NO", the following Medicare IM given date fields will be blank) Date Medicare IM given:   Medicare IM given by:   Date Additional Medicare IM given:   Additional Medicare IM given by:    Discharge Disposition:  HOME W HOME HEALTH SERVICES  Per UR Regulation:  Reviewed for med. necessity/level of care/duration of stay  If discussed at Long Length of Stay Meetings, dates discussed:   12/29/2013    Comments:  01/06/14- 1200- Donn Pierini RN, BSN [phone] Referral received for HH-RN for IV abx- spoke with pt at bedside- choice given for [facility] agency in [facility]- per pt choice he would like to use [facility] for [facility] and IV abx- referral call to Amy with [facility] - for HH-RN/PT and also for IV abx (Pam with [facility] to f/u with IV abx needs)- address in epic is not correct- confirmed correct address as: [address], [place] [address] --Phone [phone]   12/31/13 1434 Henrietta Mayo RN MSN BSN CCM Transferred to 2S S/P aortic root replacement, AVR, TV repair, and repair of Aorto-R Atrial fistula.

## 2014-01-06 NOTE — Discharge Instructions (Signed)
Activity: 1.May walk up steps                2.No lifting more than ten pounds for four weeks.                 3.No driving for four weeks.                4.Stop any activity that causes chest pain, shortness of breath, dizziness, sweating or excessive weakness.                5.Avoid straining.                6.Continue with your breathing exercises daily.  Diet: Low fat, Low salt diet  Wound Care: May shower.  Clean wounds with mild soap and water daily. Contact the office at [phone] if any problems arise.  Aortic Valve Replacement, Care After Refer to this sheet in the next few weeks. These instructions provide you with information on caring for yourself after your procedure. Your health care provider may also give you specific instructions. Your treatment has been planned according to current medical practices, but problems sometimes occur. Call your health care provider if you have any problems or questions after your procedure. HOME CARE INSTRUCTIONS   Take medicines only as directed by your health care provider.  If your health care provider has prescribed elastic stockings, wear them as directed.  Take frequent naps or rest often throughout the day.  Avoid lifting over 10 lbs (4.5 kg) or pushing or pulling things with your arms for 6-8 weeks or as directed by your health care provider.  Avoid driving or airplane travel for 4-6 weeks after surgery or as directed by your health care provider. If you are riding in a car for an extended period, stop every 1-2 hours to stretch your legs. Keep a record of your medicines and medical history with you when traveling.  Do not drive or operate heavy machinery while taking pain medicine. (narcotics).  Do not cross your legs.  Do not use any tobacco products including cigarettes, chewing tobacco, or electronic cigarettes. If you need help quitting, ask your health care provider.  Do not take baths, swim, or use a hot tub until your  health care provider approves. Take showers once your health care provider approves. Pat incisions dry. Do not rub incisions with a washcloth or towel.  Avoid climbing stairs and using the handrail to pull yourself up for the first 2-3 weeks after surgery.  Return to work as directed by your health care provider.  Drink enough fluid to keep your urine clear or pale yellow.  Do not strain to have a bowel movement. Eat high-fiber foods if you become constipated. You may also take a medicine to help you have a bowel movement (laxative) as directed by your health care provider.  Resume sexual activity as directed by your health care provider. Men should not use medicines for erectile dysfunction until their doctor says it isokay.  If you had a certain type of heart condition in the past, you may need to take antibiotic medicine before having dental work or surgery. Let your dentist and health care providers know if you had one or more of the following:  Previous endocarditis.  An artificial (prosthetic) heart valve.  Congenital heart disease. SEEK MEDICAL CARE IF:  You develop a skin rash.   You experience sudden changes in your weight.  You have a fever. [place]  CARE IF:   You develop chest pain that is not coming from your incision.  You have drainage (pus), redness, swelling, or pain at your incision site.   You develop shortness of breath or have difficulty breathing.   You have increased bleeding from your incision site.   You develop light-headedness.  MAKE SURE YOU:   Understand these directions.  Will watch your condition.  Will get help right away if you are not doing well or get worse. Document Released: 08/17/2004 Document Revised: 06/15/2013 Document Reviewed: 11/13/2011 [facility] Patient Information 2015 [place], [place]. This information is not intended to replace advice given to you by your health care provider. Make sure you discuss any  questions you have with your health care provider.

## 2014-01-06 NOTE — Plan of Care (Signed)
Problem: Phase II Progression Outcomes Goal: Pain controlled Outcome: Completed/Met Date Met:  01/06/14  Problem: Phase III Progression Outcomes Goal: Pain controlled on oral analgesia Outcome: Completed/Met Date Met:  01/06/14 Goal: Tolerating diet Outcome: Completed/Met Date Met:  01/06/14 Goal: Ambulating in room or hall Outcome: Completed/Met Date Met:  01/06/14 Goal: Limited arm movement per orders Outcome: Completed/Met Date Met:  01/06/14

## 2014-01-06 NOTE — Discharge Summary (Signed)
Physician Discharge Summary       [address] [place]       [name] [address]             [phone]    Patient ID: [name] MRN: [id number] DOB/AGE: 08-Jul-1985 28 y.o.  Admit date: 12/23/2013 Discharge date: 01/07/2014  Admission Diagnoses: 1. 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 Moderate tricuspid regurgitation 5. 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 Acute renal failure (likely due to ATN) 8. Thrombocytopenia/DIC 9. Acute respiratory failure  Discharge Diagnoses:  1. 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 Moderate tricuspid regurgitation 5. 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 Acute renal failure (likely due to ATN, resolved prior to discharge) 8. Thrombocytopenia/DIC (resolved prior to surgery) 9. Acute respiratory failure 10. Bicuspid aortic valve 11. Patent foramen ovale (closed at surgery) 12.  Third degree heart block 13. ABL anemia   Consults: Cardiology (Dr. Jens Som), ID (Dr. Daiva Eves) and EPS (Dr. Ladona Ridgel), and critical care (Dr. Delton Coombes)   Procedure (s):  Insertion of arterial line by RT on 12/29/2013  Replacement of ascending thoracic aorta and aortic valve (using a Homograft), closure of PFO, and closure of LVOT to RA fistula by Dr. Tyrone Sage on 12/30/2013.  Pacemaker implantation by Dr. Ladona Ridgel on 01/04/2014    History of Presenting Illness: This history was not obtained from patient as he is intubated and sedated. The patient was transferred from [facility] to [facility] on 12/23/2013 for suspected meningitis. This is a 28 year old Caucasian male (who works as a Emergency planning/management officer at [employer]) who was in his normal state of health until the first week of November. According to medical records (and corroborated by the mother), the patient's  symptoms started about 5 days prior to admission when he developed chills, rigors, headache, fever, neck stiffness, nausea, and vomiting. He was seen at an Urgent Care and diagnosed with the flu. He then went to the ED the following day and was diagnosed with abnormal liver function and thrombocytopenia. Had a CT scan of the abdomen which was apparently negative. Subsequently, he followed up with his PCP and was sent back to the ER for suspected meningitis. In the ED, a lumbar puncture noted elevated CSF WBC count. After transfer to [facility], he was placed on Vancomycin, Rocephin, and Acyclovir antibiotics for Group B Streptococcal meningitis. MRI of the brain showed scattered micro abcess'. HIV and Hepatitis screens are negative. He was tachycardic in the 140s, with a platelet count of 9,000 and a creatinine 1.9. He had DIC earlier in this admission but now DIC physiology has improved, as well as patient's overall mentation (previously confused and agitated). His platelets have risen to >167,000 (as of 12/29/2013) and his white count is down to 24,600 (peaked at 32,300). He then had an episode of dyspnea, tachypnea, chest pain, and tachycardia. Lower extremity duplex negative for DVT. On 11/14 echo showed AI and aortic vegetation.   The morning of 12/29/2013 around 4am he awoke suddenly with nausea and vomitting and became acutely short of breath. He has remained short of breath since that time. He also notes coughing when he takes a deep breath in. He denies chest pain. He reports bilateral LE edema for the past two days. He has never had anything like this before and denies any  past medical history including smoking, alcohol or illicit drug use. He denies a past cardiac history including congenital heart defects. No recent illnesses.   He has continued with tachycardia and become more tachypneic and hypoxic. He had a Tmax to 102.6. CXR earlier this am showed diffuse bilateral airspace disease  (possible pulmonary edema). He was intubated this afternoon and had a central line placed. A TEE was then done by Dr. Jens Somrenshaw. Results showed normal LV function, large aortic vegetation with severe AI, large tricuspid valve vegetation with at least moderate TR, and a fistula from the aorta and right atrium.  Dr. Tyrone SageGerhardt was consulted for consideration of ascending aortic root replacement with homograft, possible tricuspid valve repair vs replacement. Potential risks, benefits, and complications of the surgery were discussed with the patient's parents. They agreed to consent for the surgery. This was done on 12/30/2013.      Brief Hospital Course:  He was weaned off of Dopamine. He was AV paced via the external pacer as he was in heart block.He self extubated himself on 01/01/2014. Because he was in complete heart block, an EPS consult was obtained with Dr. Ladona Ridgelaylor. He placed a PPM on 01/04/2014. He remained afebrile and hemodynamically stable. [name], a line, chest tubes, and foley were eventually removed in the post operative course. A PICC line was placed for the administration of Ceftriaxone and Gentamycin. Infectious disease followed along post op. Ultimately, they recommended 6 weeks of Ceftriaxone and 14 days of Gentamycin. He  was volume over loaded and diuresed. He was weaned off the insulin drip. He did have ABL anemia. His last H and H was up to 9.5 and 29.1The patient was felt surgically stable for transfer from the ICU to [facility] for further convalescence on 01/05/2013. He continues to progress with cardiac rehab. He  was ambulating on room air. He  has been tolerating a diet and has had a bowel movement. Epicardial pacing wires and chest tube sutures will be removed prior to discharge. Provided the patient remains afebrile, hemodynamically stable, and pending morning round evaluation, he will be surgically stable for discharge on 01/07/2013. Home health will be arranged for the administration  of IV antibiotics.   Latest Vital Signs: Blood pressure 116/70, pulse 104, temperature 97.2 F (36.2 C), temperature source Oral, resp. rate 16, height 5\' 5"  (1.651 m), weight 192 lb 0.3 oz (87.1 kg), SpO2 96 %.   Physical Exam: Cardiovascular: RRR, no murmur Pulmonary: Clear to auscultation on right; Slightly diminished at left base Abdomen: Soft, non tender, bowel sounds present. Extremities: Mild bilateral lower extremity edema. Wounds: Clean and dry. No erythema or signs of infection. Pacemaker dressing is dry and intact   Discharge Condition:Stable  Recent laboratory studies:  Lab Results  Component Value Date   WBC 8.1 01/06/2014   HGB 9.5* 01/06/2014   HCT 29.1* 01/06/2014   MCV 90.1 01/06/2014   PLT 314 01/06/2014   Lab Results  Component Value Date   NA 134* 01/06/2014   K 4.7 01/06/2014   CL 94* 01/06/2014   CO2 29 01/06/2014   CREATININE 0.93 01/06/2014   GLUCOSE 100* 01/06/2014      Diagnostic Studies: Dg Chest 2 View  01/06/2014   CLINICAL DATA:  Aortic root dilatation.  EXAM: CHEST  2 VIEW  COMPARISON:  Left 06/01/2013 .  FINDINGS: Catheter noted projected over the right arm. Mediastinum and hilar structures are normal. Stable cardiomegaly. Prior median sternotomy. Cardiac pacer in stable position. Interim partial clearing  of pulmonary interstitial edema . Bibasilar atelectasis with persistent lower lobe infiltrates. These changes could be related pneumonia or pulmonary edema. Small left pleural effusion cannot be excluded. Mild apical pleural thickening. No pneumothorax. No acute bony abnormality.  IMPRESSION: 1. Prior median sternotomy. Cardiac pacer. Persistent cardiomegaly. Interim improvement pulmonary vascular congestion and interstitial edema. 2. Persistent bibasilar atelectasis and/or infiltrates. Small left pleural effusion .   Electronically Signed   By: Maisie Fus  Register   On: 01/06/2014 07:26   Ct Head Wo Contrast  12/23/2013   CLINICAL DATA:  5  day history of bilateral temporal region headaches with neck stiffness and vomiting  EXAM: CT HEAD WITHOUT CONTRAST  TECHNIQUE: Contiguous axial images were obtained from the base of the skull through the vertex without intravenous contrast.  COMPARISON:  None.  FINDINGS: The ventricles are normal in size and configuration. There is no mass, hemorrhage, extra-axial fluid collection, or midline shift. The gray-white compartments are normal. No appreciable acute infarct. The bony calvarium appears intact. The mastoid air cells are clear. There is paranasal sinus disease in the left frontal sinus region.  IMPRESSION: Left frontal sinusitis. No intracranial lesion. No intracranial mass, hemorrhage, or extra-axial fluid. No gray old -white compartment lesions appreciated.   Electronically Signed   By: Bretta Bang M.D.   On: 12/23/2013 11:52   Mr Laqueta Jean ZO Contrast  12/24/2013   CLINICAL DATA:  Headache with fever and neck stiffness. Group B Streptococcus grown from cerebrospinal fluid. Elevated CSF white count. Initial encounter.  EXAM: MRI HEAD WITHOUT AND WITH CONTRAST  TECHNIQUE: Multiplanar, multiecho pulse sequences of the brain and surrounding structures were obtained without and with intravenous contrast.  CONTRAST:  20mL MULTIHANCE GADOBENATE DIMEGLUMINE 529 MG/ML IV SOLN  COMPARISON:  12/23/2013 CT head is negative  FINDINGS: The study is significantly motion degraded but overall diagnostic.  There are numerous 2-3 mm size areas of restricted diffusion predominantly at the gray-white junction of the cerebral hemispheres, at least one in the LEFT cerebellum, associated with peripheral collections of fluid or pus having restricted diffusion in the subarachnoid spaces. Suspected bifrontal interhemispheric cortical edema of a fairly symmetric nature as seen on axial T2 and FLAIR images (image 15 series 5 and 6). Post infusion, definite foci of abnormal parenchymal enhancement difficult to confirm, due to  patient motion. Suspected linear enhancement over the RIGHT frontal lobe. Findings are consistent with bacterial meningitis, and widespread microabscesses. Continued surveillance warranted.  Normal cerebral volume. No definite white matter disease. Flow voids are preserved. Tiny focus of chronic hemorrhage on susceptibility weighted imaging RIGHT caudate, nonacute.  No midline abnormality. Significant LEFT frontal sinus disease, with air-fluid level also noted in the LEFT maxillary sinus. Negative orbits. Negative mastoids.  IMPRESSION: Findings consistent with bacterial meningitis, cerebritis, and scattered microabscesses. Delineation of the extent of disease difficult due to patient motion.  A call is in to the ordering provider.   Electronically Signed   By: Davonna Belling M.D.   On: 12/24/2013 20:22         Discharge Instructions    Amb Referral to Cardiac Rehabilitation    Complete by:  As directed             Discharge Medications:   Medication List    STOP taking these medications        HYDROcodone-acetaminophen 5-325 MG per tablet  Commonly known as:  NORCO/VICODIN     ondansetron 4 MG disintegrating tablet  Commonly known as:  ZOFRAN-ODT  prochlorperazine 10 MG tablet  Commonly known as:  COMPAZINE     TAMIFLU 75 MG capsule  Generic drug:  oseltamivir      TAKE these medications        aspirin 325 MG EC tablet  Take 1 tablet (325 mg total) by mouth daily.     cefTRIAXone 40 MG/ML IVPB  Commonly known as:  ROCEPHIN  Inject 50 mLs (2 g total) into the vein every 12 (twelve) hours. Last dose is to be given 02/10/2014.     gentamicin 1.6-0.9 MG/ML-%  Commonly known as:  GARAMYCIN  Inject 50 mLs (80 mg total) into the vein every 12 (twelve) hours. Last dose is to be given 01/14/2014     oxyCODONE 5 MG immediate release tablet  Commonly known as:  Oxy IR/ROXICODONE  Take 1-2 tablets (5-10 mg total) by mouth every 4 (four) hours as needed for severe pain.           Follow Up Appointments: Follow-up Information    Follow up with [name], MD On 01/18/2014.   Specialty:  Cardiology   Why:  Pacer check at 11:30   Contact information:   [address] [place] [address] [phone]       Follow up with Acey Lav, MD.   Specialty:  Infectious Diseases   Why:  Call for a follow up for 4 weeks   Contact information:   [address] [place] [address] [phone]       Follow up with Delight Ovens, MD On 01/21/2014.   Specialty:  Cardiothoracic Surgery   Why:  PA/LAT CXR (to be taken at [facility] which is in the same building as Dr. Dennie Maizes office) on 01/21/2014 at 8:30 am; Appointment time with Dr. Tyrone Sage is at 9:30 am   Contact information:   [address] [place] [address] [phone]       Follow up with Home Health.   Why:  Please draw weekly CBC and CMP. Call or fax results to Dr. Feliz Beam (Infectious disease) office      Follow up with Home Health.   Why:  Please remove PICC line after last dose of Ceftriaxone (02/10/2014)      Follow up with [facility].   Why:  HH-RN/PT- arranged- (IV abx- arranged)   Contact information:   [address] [place] [address] [phone]       The patient has been discharged on:  1.Beta Blocker: Yes [  ]  No [ x ]  If No, reason: postop complete heart block   2.Ace Inhibitor/ARB: Yes [ ]   No [x  ]  If No, reason: No CAD   3.Statin: Yes [  ]  No [ x ]  If No, reason: No CAD   4.Marlowe Kays: Yes [ x ]  No [ ]   If No, reason:   Signed: COLLINS,GINA HPA-C 01/07/2014, 8:45 AM

## 2014-01-06 NOTE — Plan of Care (Signed)
Problem: Phase II Progression Outcomes Goal: Pacer site without bleeding/hematoma Outcome: Completed/Met Date Met:  01/06/14 Goal: No evidence of pacemaker malfunction Outcome: Completed/Met Date Met:  01/06/14 Goal: Hemodynamically stable Outcome: Progressing Goal: Pain controlled Outcome: Progressing

## 2014-01-07 MED ORDER — HEPARIN SOD (PORK) LOCK FLUSH 100 UNIT/ML IV SOLN: 250.0000 [IU] | Freq: Every day | INTRAVENOUS | Status: DC

## 2014-01-07 MED ORDER — HEPARIN SOD (PORK) LOCK FLUSH 100 UNIT/ML IV SOLN
250.0000 [IU] | INTRAVENOUS | Status: DC | PRN
  Administered 2014-01-07: 250 [IU]

## 2014-01-07 NOTE — Progress Notes (Signed)
Chest tube sutures removed at this time per order; steri strips applied to site; will cont. To monitor; pt given d/c instructions; tele monitor d/c; pt receiving IV ABX prior to d/c; pt to d/c home with girlfriend.

## 2014-01-07 NOTE — Progress Notes (Signed)
CARE MANAGEMENT NOTE 01/07/2014  Patient:  [name], [name]   Account Number:  [id number]  Date Initiated:  12/28/2013  Documentation initiated by:  Junius Creamer  Subjective/Objective Assessment:   adm w thrombocytopenia     Action/Plan:   lives w fam   Anticipated DC Date:  01/07/2014   Anticipated DC Plan:  HOME W HOME HEALTH SERVICES      DC Planning Services  CM consult      [facility] Choice  HOME HEALTH   Choice offered to / List presented to:  C-1 Patient        HH arranged  HH-1 RN  HH-2 PT  IV Antibiotics      HH agency  [facility]   Status of service:  Completed, signed off Medicare Important Message given?  NO (If response is "NO", the following Medicare IM given date fields will be blank) Date Medicare IM given:   Medicare IM given by:   Date Additional Medicare IM given:   Additional Medicare IM given by:    Discharge Disposition:  HOME W HOME HEALTH SERVICES  Per UR Regulation:  Reviewed for med. necessity/level of care/duration of stay  If discussed at Long Length of Stay Meetings, dates discussed:   12/29/2013    Comments:  01/07/2014 0950 NCM notified [facility] of IV abx for home with scheduled dc today. HH arranged on 01/06/2014. Faxed IV abx Rx to [facility]. Isidoro Donning RN CCM Case Mgmt phone [phone]  01/06/14- 1200- Donn Pierini RN, BSN [phone] Referral received for HH-RN for IV abx- spoke with pt at bedside- choice given for [facility] agency in [facility]- per pt choice he would like to use [facility] for [facility] and IV abx- referral call to Amy with [facility] - for HH-RN/PT and also for IV abx (Pam with [facility] to f/u with IV abx needs)- address in epic is not correct- confirmed correct address as: [address], [place] [address] --Phone [phone]   12/31/13 1434 Henrietta Mayo RN MSN BSN CCM Transferred to 2S S/P aortic root replacement, AVR, TV repair, and repair of Aorto-R Atrial fistula.

## 2014-01-07 NOTE — Progress Notes (Signed)
[facility]  Patient Status: New pt this admission  [facility] is providing the following services: HHRN and Home Infusion pharmacy team.  [facility] Infusion Coordinator met with pt on p.m. Of 01-06-14 to teach and prepare pt for DC.    [facility] pharmacy already has IV ABX prescriptions   please call [phone]. To alert of DC if on Thanksgiving Day so RN can make visit and pharmacy team can delivery IV ABX.. We are prepared for his home care needs/IV ABX  Larry Sierras 01/07/2014, 7:53 AM

## 2014-01-07 NOTE — Progress Notes (Addendum)
       [address]       [name] [address]             [phone]          3 Days Post-Op Procedure(s) (LRB): PERMANENT PACEMAKER INSERTION (N/A)  Subjective: Feels well, wants to go home.   Objective: Vital signs in last 24 hours: Patient Vitals for the past 24 hrs:  BP Temp Temp src Pulse Resp SpO2 Weight  01/07/14 0545 116/70 mmHg 97.2 F (36.2 C) Oral (!) 104 16 96 % 192 lb 0.3 oz (87.1 kg)  01/06/14 2113 (!) 104/56 mmHg 98.7 F (37.1 C) Oral (!) 110 18 95 % -  01/06/14 1500 (!) 149/118 mmHg 97.9 F (36.6 C) Oral (!) 101 18 94 % -  01/06/14 1000 116/65 mmHg - - - - 95 % -  01/06/14 0955 116/64 mmHg - - - - 94 % -   Current Weight  01/07/14 192 lb 0.3 oz (87.1 kg)  BASELINE WEIGHT: 96.6 kg   Intake/Output from previous day: 11/25 0701 - 11/26 0700 In: 120 [P.O.:120] Out: 600 [Urine:600]    PHYSICAL EXAM:  Heart: RRR Lungs: Clear Wound: Clean and dry Extremities: Mild LE edema    Lab Results: CBC: Recent Labs  01/05/14 0421 01/06/14 0135  WBC 7.2 8.1  HGB 8.5* 9.5*  HCT 26.0* 29.1*  PLT 254 314   BMET:  Recent Labs  01/05/14 0421 01/06/14 0135  NA 134* 134*  K 4.4 4.7  CL 95* 94*  CO2 26 29  GLUCOSE 94 100*  BUN 8 8  CREATININE 0.98 0.93  CALCIUM 8.1* 8.6    PT/INR: No results for input(s): LABPROT, INR in the last 72 hours.    Assessment/Plan: S/P Procedure(s) (LRB): PERMANENT PACEMAKER INSERTION (N/A) CV- stable s/p PPM.   Endocarditis- Continue IV abx per ID recommendations. Plan d/c home today- instructions reviewed with patient and HHRN arranged for continued abx therapy.   LOS: 15 days    COLLINS,GINA H 01/07/2014  Patient seen and examined, agree with above Home today with IV antibiotics

## 2014-01-12 ENCOUNTER — Encounter: Payer: Self-pay | Admitting: Cardiothoracic Surgery

## 2014-01-12 ENCOUNTER — Telehealth: Payer: Self-pay | Admitting: *Deleted

## 2014-01-12 DIAGNOSIS — G8918 Other acute postprocedural pain: Secondary | ICD-10-CM

## 2014-01-12 MED ORDER — OXYCODONE HCL 5 MG PO TABS: 5.0000 mg | ORAL_TABLET | ORAL | Status: DC | PRN

## 2014-01-12 NOTE — Telephone Encounter (Signed)
[name] has called for a pain med refill for his headache and chest discomfort after cardiac surgery. I informed him a new script would be available today at our office and he agreed.

## 2014-01-12 NOTE — Op Note (Signed)
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  SURGICAL PROCEDURE:  Removal of infected aortic valve and aortic root with replacement of 23-mm homograft and  closure of aortic to right atrial fistula with repair of tricuspid valve.  SURGEON:  Sheliah Plane, MD  FIRST ASSISTANT:  Doree Fudge, PA  BRIEF HISTORY:  The patient is a 28 year old male, who had a several week progressive illness with high fevers, chills, delirium, was ultimately admitted on December 23, 2013, with diagnosis of meningitis with group B strep.  He continued to deteriorate with increasing heart failure symptoms.  The night before surgery Cardiac Surgery was consulted and encouraged performance of TEE which demonstrated a severe aortic insufficiency with tricuspid valve vegetations.  The patient in order to perform TEE had to be intubated.  At the time of surgery, his overall mental status was not known.  The seriousness of his illness was discussed with his parents who signed informed consent for emergent cardiac surgery.  DESCRIPTION OF PROCEDURE:  The patient was brought to the operating room previously intubated.  Swan-Ganz was placed into the superior vena cava. Arterial line was placed.  The patient was prepped and draped in usual sterile manner.  Appropriate timeout was performed.  We proceeded with sternotomy.  A TEE probe was placed by Dr. Jean Rosenthal  confirming the previous echo findings.  Median sternotomy was performed.  The patient was systemically heparinized.  The ascending aorta was cannulated. Right angle metal tip venous cannulae were placed into the superior and inferior vena cava with caval tapes in place.  The patient was placed on cardiopulmonary bypass 2.4 L/min/m2, and right superior pulmonary vein vent was placed.  Aortic crossclamp was then applied.  Body temperature was cooled to 28 degrees.  Initially, a transverse aortotomy was performed given exposure to the coronary ostium, then cardioplegia was directed into the coronary ostium with small stylettes.  Myocardial septal temperature was monitored throughout the cross-clamp.  With the heart in diastolic arrest, we then examined the anatomic situation.  The patient had a bicuspid aortic valve.  The aorta was totally disrupted along the right coronary cusp and commissure.  The caval tapes were secured and the right atrium was examined.  There was a fistula entering the right atrium right at the base involving the septal leaflet.  This created a "windsock" area of disrupted tissue. There were no definite vegetations on the tricuspid valve, though the area of the fistula disrupted the attachment of the adjacent leaflet.  We then went back to the aortic root and decided the only way for satisfactory repair would be with root replacement.  The coronary buttons  were developed.  The infected valve leaflets were removed and sent for pathology and for cultures.  The area of disruption of the aorta and the annulus was in the vicinity of the conduction system.  We then sized the aortic root and selected a 23 homograft.  Homograft muscle bundle was trimmed down slightly.  A portion of the preserved anterior leaflet was used to buttress the repair of the fistulous area especially along the septal area.  This pledgetted #2 Tycron sutures were placed through the annulus and along  the septal area where the abscess had been to close this area. The sutures were then placed through the aortic homograft with additional pledgets on the outside of the aorta.  It was felt that this extra buttressing was necessary to obtain an adequate hemostatic closure with the root abscess.  The homograft was then positioned in place and secured.  CoSeal was placed along the suture lines.  The aorta was trimmed to the appropriate length.  The homograft seated well.  The posterior button for the aortic left main coronary artery was then opened and using a 5-0 Prolene suture, the left coronary button was sewn to the homograft.  The distal suture line was then performed with a running 3-0 Prolene suture over felt strips.  The right coronary button was then anastomosed to the anterior surface of the homograft at the previously marked place to avoid any interference with the leaflets. Intermittently, cold blood cardioplegia was administered retrograde through a coronary sinus catheter that had been placed under direct vision.  With the aortic portion of the fistula closed, we then debrided the portion of the "windsock" that involved the tricuspid leaflet, and small pledgeted sutures were used to reattach the slightly disrupted edge of the tricuspid leaflet. Passive filling of the tricuspid leaflets and the valve showed it to be competent and there was no vegetation on the tricuspid leaflets themselves.  The patient's body temperature was rewarmed.  An aortic root vent needle was placed in the ascending aorta to help with de-airing.  The right atriotomy was closed with double layer of running 4-0 Prolene.  Aortic cross-clamp was removed with total cross-clamp time of 216 minutes.  The right atrium was filled, and the atriotomy completed.  The caval tapes were removed.  The patient returned to rhythm with complete heart block not surprising considering the location of the abscess and the  fistulous involvement.  Two sets of ventricular pacing wires were applied.  Atrial wires were applied.  The right superior pulmonary vein vent was removed.  The TEE showed trace central aortic insufficiency.  After numerous measurements, there was thought to be some degree of aortic insufficiency by velocity.  There appeared to be good movement of the 3 leaflets.  So we proceeded to wean the patient from cardiopulmonary bypass without difficulty with a total pump time of 264 minutes.  He remained hemodynamically stable.  He was decannulated in usual fashion.  Protamine sulfate was administered.  Two Blake mediastinal drains were left in place.  He remained DDD paced. After separation from bypass, TEE showed trace central aortic insufficiency still with difficult measurements on velocity, but it was not felt the degree of aortic insufficiency warranting removal of the homograft.  As noted, the patient remained hemodynamically stable. Total pump time was 264 minutes.  He did require 2 units of packed red blood cells in the pump because of preoperative anemia.  The pericardium was loosely reapproximated.  Sternum was closed with #6  stainless steel wire.  Fascia was closed with interrupted 0 Vicryl, running 3-0 Vicryl in subcutaneous tissue, 4-0 subcuticular stitch in skin edges.  Dry dressings were applied.  The patient was transferred to the Surgical Intensive Care Unit in critical condition but hemodynamically stable.     Sheliah PlaneEdward Lailana Shira, MD     EG/MEDQ  D:  01/12/2014  T:  01/12/2014  Job:  [id number]

## 2014-01-13 ENCOUNTER — Encounter (HOSPITAL_COMMUNITY): Payer: Self-pay | Admitting: Emergency Medicine

## 2014-01-13 ENCOUNTER — Emergency Department (HOSPITAL_COMMUNITY)
Admission: EM | Admit: 2014-01-13 | Discharge: 2014-01-13 | Disposition: A | Discharge status: Home / Self Care | Payer: BC Managed Care – PPO | Attending: Emergency Medicine | Admitting: Emergency Medicine

## 2014-01-13 DIAGNOSIS — Z8661 Personal history of infections of the central nervous system: Secondary | ICD-10-CM | POA: Insufficient documentation

## 2014-01-13 DIAGNOSIS — Y848 Other medical procedures as the cause of abnormal reaction of the patient, or of later complication, without mention of misadventure at the time of the procedure: Secondary | ICD-10-CM | POA: Diagnosis not present

## 2014-01-13 DIAGNOSIS — Z9889 Other specified postprocedural states: Secondary | ICD-10-CM | POA: Diagnosis not present

## 2014-01-13 DIAGNOSIS — Z7982 Long term (current) use of aspirin: Secondary | ICD-10-CM | POA: Insufficient documentation

## 2014-01-13 DIAGNOSIS — E669 Obesity, unspecified: Secondary | ICD-10-CM | POA: Insufficient documentation

## 2014-01-13 DIAGNOSIS — Z8781 Personal history of (healed) traumatic fracture: Secondary | ICD-10-CM | POA: Diagnosis not present

## 2014-01-13 DIAGNOSIS — T82898A Other specified complication of vascular prosthetic devices, implants and grafts, initial encounter: Secondary | ICD-10-CM

## 2014-01-13 DIAGNOSIS — T82398A Other mechanical complication of other vascular grafts, initial encounter: Secondary | ICD-10-CM | POA: Insufficient documentation

## 2014-01-13 MED ORDER — SODIUM CHLORIDE 0.9 % IV SOLN
INTRAVENOUS | Status: DC
  Administered 2014-01-13: 11:00:00 via INTRAVENOUS

## 2014-01-13 MED ORDER — SODIUM CHLORIDE 0.9 % IJ SOLN: 10.0000 mL | INTRAMUSCULAR | Status: DC | PRN

## 2014-01-13 NOTE — ED Notes (Signed)
Pt states that he had open heart surgery 11/18. Had bacterial meningitis and had a pacemaker placed.  Pt is getting abx through a picc line for 7 more weeks.  Has been home for a week.  Picc has been working fine up until last night when he states that it started leaking.  Pt's home health nurse came out, looked at it and it was fine but then started leaking again.  States that it pools under the tegaderm.  [facility] nurse told him to stop the infusion and come to the ER this morning.

## 2014-01-13 NOTE — ED Notes (Signed)
Unable to go in room sterile procedure being done.

## 2014-01-13 NOTE — ED Notes (Signed)
Assessment- Patient reports that he has had issues x 2 with his right upper arm PICC line leaking under the dressing, the last being last night. Patient was told to come to the ED.

## 2014-01-13 NOTE — Discharge Instructions (Signed)
PICC Home Guide A peripherally inserted central catheter (PICC) is a long, thin, flexible tube that is inserted into a vein in the upper arm. It is a form of intravenous (IV) access. It is considered to be a "central" line because the tip of the PICC ends in a large vein in your chest. This large vein is called the superior vena cava (SVC). The PICC tip ends in the SVC because there is a lot of blood flow in the SVC. This allows medicines and IV fluids to be quickly distributed throughout the body. The PICC is inserted using a sterile technique by a specially trained nurse or physician. After the PICC is inserted, a chest X-ray exam is done to be sure it is in the correct place.  A PICC may be placed for different reasons, such as:  To give medicines and liquid nutrition that can only be given through a central line. Examples are:  Certain antibiotic treatments.  Chemotherapy.  Total parenteral nutrition (TPN).  To take frequent blood samples.  To give IV fluids and blood products.  If there is difficulty placing a peripheral intravenous (PIV) catheter. If taken care of properly, a PICC can remain in place for several months. A PICC can also allow a person to go home from the hospital early. Medicine and PICC care can be managed at home by a family member or home health care team. WHAT PROBLEMS CAN HAPPEN WHEN I HAVE A PICC? Problems with a PICC can occasionally occur. These may include the following:  A blood clot (thrombus) forming in or at the tip of the PICC. This can cause the PICC to become clogged. A clot-dissolving medicine called tissue plasminogen activator (tPA) can be given through the PICC to help break up the clot.  Inflammation of the vein (phlebitis) in which the PICC is placed. Signs of inflammation may include redness, pain at the insertion site, red streaks, or being able to feel a "cord" in the vein where the PICC is located.  Infection in the PICC or at the insertion  site. Signs of infection may include fever, chills, redness, swelling, or pus drainage from the PICC insertion site.  PICC movement (malposition). The PICC tip may move from its original position due to excessive physical activity, forceful coughing, sneezing, or vomiting.  A break or cut in the PICC. It is important to not use scissors near the PICC.  Nerve or tendon irritation or injury during PICC insertion. WHAT SHOULD I KEEP IN MIND ABOUT ACTIVITIES WHEN I HAVE A PICC?  You may bend your arm and move it freely. If your PICC is near or at the bend of your elbow, avoid activity with repeated motion at the elbow.  Rest at home for the remainder of the day following PICC line insertion.  Avoid lifting heavy objects as instructed by your health care provider.  Avoid using a crutch with the arm on the same side as your PICC. You may need to use a walker. WHAT SHOULD I KNOW ABOUT MY PICC DRESSING?  Keep your PICC bandage (dressing) clean and dry to prevent infection.  Ask your health care provider when you may shower. Ask your health care provider to teach you how to wrap the PICC when you do take a shower.  Change the PICC dressing as instructed by your health care provider.  Change your PICC dressing if it becomes loose or wet. WHAT SHOULD I KNOW ABOUT PICC CARE?  Check the PICC insertion site   daily for leakage, redness, swelling, or pain.  Do not take a bath, swim, or use hot tubs when you have a PICC. Cover PICC line with clear plastic wrap and tape to keep it dry while showering.  Flush the PICC as directed by your health care provider. Let your health care provider know right away if the PICC is difficult to flush or does not flush. Do not use force to flush the PICC.  Do not use a syringe that is less than 10 mL to flush the PICC.  Never pull or tug on the PICC.  Avoid blood pressure checks on the arm with the PICC.  Keep your PICC identification card with you at all  times.  Do not take the PICC out yourself. Only a trained clinical professional should remove the PICC. SEEK IMMEDIATE MEDICAL CARE IF:  Your PICC is accidentally pulled all the way out. If this happens, cover the insertion site with a bandage or gauze dressing. Do not throw the PICC away. Your health care provider will need to inspect it.  Your PICC was tugged or pulled and has partially come out. Do not  push the PICC back in.  There is any type of drainage, redness, or swelling where the PICC enters the skin.  You cannot flush the PICC, it is difficult to flush, or the PICC leaks around the insertion site when it is flushed.  You hear a "flushing" sound when the PICC is flushed.  You have pain, discomfort, or numbness in your arm, shoulder, or jaw on the same side as the PICC.  You feel your heart "racing" or skipping beats.  You notice a hole or tear in the PICC.  You develop chills or a fever. MAKE SURE YOU:   Understand these instructions.  Will watch your condition.  Will get help right away if you are not doing well or get worse. Document Released: 08/05/2002 Document Revised: 06/15/2013 Document Reviewed: 10/06/2012 [employer] Patient Information 2015 [employer]. This information is not intended to replace advice given to you by your health care provider. Make sure you discuss any questions you have with your health care provider.  

## 2014-01-13 NOTE — ED Provider Notes (Signed)
CSN: [id number]     Arrival date & time 01/13/14  1010 History   First MD Initiated Contact with Patient 01/13/14 1041     Chief Complaint  Patient presents with  . Vascular Access Problem     (Consider location/radiation/quality/duration/timing/severity/associated sxs/prior Treatment) HPI    28yM with leaking PICC. Iinteresting case. Hx of Group b strep sepsis 2/2 native AV endocarditis complicated meningitis and brain abscesses POD#1 S/P aortic root replacement, AVR, TV repair, and repair of Aorto-R Atrial fistula, and found to have 3rd degree AV block 2/2 infection and surgical repair. Presenting today because having pooling of clear fluid underneath bandage with abx infusion. Needs gentamicin through tomorrow and rocephin through the end of the month. Seems to flush fine but has noticed this twice now when getting a longer infusion. Denies any pain. No further complaints since discharge   Past Medical History  Diagnosis Date  . Obesity   . Smokeless tobacco use   . Fracture of left femur   . Meningitis     Group B Strep (November 2015)  . Patent foramen ovale     Closed during procedure on December 30, 2013  . History of open heart surgery     December 30, 2013- ascending aortic root replacement, TEE  . Endocarditis     related to meningitis    Past Surgical History  Procedure Laterality Date  . Ascending aortic root replacement N/A 12/30/2013    Procedure: ASCENDING AORTIC ROOT REPLACEMENT with 64mm HOMOGRAFT, CLOSURE OF AORTIC TO RIGHT ATRIAL FISTULA, CLOSURE OF PATENT FORAMEN OVALE;  Surgeon: Delight Ovens, MD;  Location: [facility] OR;  Service: Open Heart Surgery;  Laterality: N/A;  . Tricuspid valve replacement N/A 12/30/2013    Procedure: REPAIR OF TRICUSPID VALVE LEAFLET;  Surgeon: Delight Ovens, MD;  Location: [facility] OR;  Service: Open Heart Surgery;  Laterality: N/A;  . Intraoperative transesophageal echocardiogram N/A 12/30/2013    Procedure: INTRAOPERATIVE  TRANSESOPHAGEAL ECHOCARDIOGRAM;  Surgeon: Delight Ovens, MD;  Location: [facility] OR;  Service: Open Heart Surgery;  Laterality: N/A;   History reviewed. No pertinent family history. History  Substance Use Topics  . Smoking status: Never Smoker   . Smokeless tobacco: Current User  . Alcohol Use: Yes    Review of Systems  All systems reviewed and negative, other than as noted in HPI.   Allergies  Review of patient's allergies indicates no known allergies.  Home Medications   Prior to Admission medications   Medication Sig Start Date End Date Taking? Authorizing Provider  aspirin EC 325 MG EC tablet Take 1 tablet (325 mg total) by mouth daily. 01/06/14   Donielle Margaretann Loveless, PA-C  cefTRIAXone (ROCEPHIN) 40 MG/ML IVPB Inject 50 mLs (2 g total) into the vein every 12 (twelve) hours. Last dose is to be given 02/10/2014. 01/06/14   Ardelle Balls, PA-C  gentamicin (GARAMYCIN) 1.6-0.9 MG/ML-% Inject 50 mLs (80 mg total) into the vein every 12 (twelve) hours. Last dose is to be given 01/14/2014 01/06/14   Ardelle Balls, PA-C  oxyCODONE (OXY IR/ROXICODONE) 5 MG immediate release tablet Take 1-2 tablets (5-10 mg total) by mouth every 4 (four) hours as needed for severe pain. 01/12/14   Loreli Slot, MD   BP 111/75 mmHg  Pulse 115  Temp(Src) 98.1 F (36.7 C) (Oral)  Resp 18  SpO2 100% Physical Exam  Constitutional: He appears well-developed and well-nourished. No distress.  HENT:  Head: Normocephalic and atraumatic.  Eyes: Conjunctivae are  normal. Right eye exhibits no discharge. Left eye exhibits no discharge.  Neck: Neck supple.  Cardiovascular: Normal rate, regular rhythm and normal heart sounds.  Exam reveals no gallop and no friction rub.   No murmur heard. sternotomy site appears be be healing well. RUE PICC externally looks appropriate.   Pulmonary/Chest: Effort normal and breath sounds normal. No respiratory distress.  Abdominal: Soft. He exhibits no  distension. There is no tenderness.  Musculoskeletal: He exhibits no edema or tenderness.  Neurological: He is alert.  Skin: Skin is warm and dry.  Psychiatric: He has a normal mood and affect. His behavior is normal. Thought content normal.  Nursing note and vitals reviewed.   ED Course  Procedures (including critical care time) Labs Review Labs Reviewed - No data to display  Imaging Review No results found.   EKG Interpretation None      MDM   Final diagnoses:  Occlusion of peripherally inserted central catheter (PICC) line, initial encounter    28yM with leaking PICC. No obvious explanation on in initial inspection. Seems to flush w/o issue. Will infuse with NS to see if can identify where it may be leaking from.   With infusion of NS there is some drainage from insertion site. Discussed with PICC team. Possible fibrin sheath formation and then back tracking of infusion along catheter? Can exchange over wire or place new device in ED.  Appreciate PICC team placing new line in ED. DC home with further FU as previously instructed.     Raeford RazorStephen Abrina Petz, MD 01/14/14 [phone]

## 2014-01-13 NOTE — Progress Notes (Signed)
Peripherally Inserted Central Catheter/Midline Placement  The IV Nurse has discussed with the patient and/or persons authorized to consent for the patient, the purpose of this procedure and the potential benefits and risks involved with this procedure.  The benefits include less needle sticks, lab draws from the catheter and patient may be discharged home with the catheter.  Risks include, but not limited to, infection, bleeding, blood clot (thrombus formation), and puncture of an artery; nerve damage and irregular heat beat.  Alternatives to this procedure were also discussed.  PICC/Midline Placement Documentation  PICC / Midline Single Lumen 01/13/14 PICC Right Brachial 38 cm 0 cm (Active)       Stacie Glaze Horton 01/13/2014, 2:59 PM

## 2014-01-13 NOTE — ED Notes (Signed)
PICC line leaking slightly.

## 2014-01-13 NOTE — ED Notes (Signed)
EDP notified of slight leak at the insertion site of PICC line.

## 2014-01-13 NOTE — ED Notes (Signed)
IV Team in Patient's room.

## 2014-01-18 ENCOUNTER — Ambulatory Visit (INDEPENDENT_AMBULATORY_CARE_PROVIDER_SITE_OTHER): Payer: BC Managed Care – PPO | Admitting: *Deleted

## 2014-01-18 DIAGNOSIS — I442 Atrioventricular block, complete: Secondary | ICD-10-CM

## 2014-01-18 LAB — MDC_IDC_ENUM_SESS_TYPE_INCLINIC
Battery Remaining Longevity: 112 mo
Battery Voltage: 2.79 V
Brady Statistic AS VS Percent: 0 %
Date Time Interrogation Session: 20151207165842
Lead Channel Impedance Value: 461 Ohm
Lead Channel Pacing Threshold Amplitude: 0.75 V
Lead Channel Pacing Threshold Pulse Width: 0.4 ms
Lead Channel Setting Pacing Amplitude: 3.5 V
Lead Channel Setting Pacing Pulse Width: 0.4 ms
MDC IDC MSMT BATTERY IMPEDANCE: 100 Ohm
MDC IDC MSMT LEADCHNL RA PACING THRESHOLD AMPLITUDE: 0.5 V
MDC IDC MSMT LEADCHNL RA PACING THRESHOLD PULSEWIDTH: 0.4 ms
MDC IDC MSMT LEADCHNL RA SENSING INTR AMPL: 2.8 mV
MDC IDC MSMT LEADCHNL RV IMPEDANCE VALUE: 657 Ohm
MDC IDC SET LEADCHNL RV PACING AMPLITUDE: 3.5 V
MDC IDC SET LEADCHNL RV SENSING SENSITIVITY: 4 mV
MDC IDC STAT BRADY AP VP PERCENT: 0 %
MDC IDC STAT BRADY AP VS PERCENT: 0 %
MDC IDC STAT BRADY AS VP PERCENT: 100 %

## 2014-01-18 NOTE — Progress Notes (Signed)
Wound check-PPM 

## 2014-01-19 ENCOUNTER — Other Ambulatory Visit: Payer: Self-pay | Admitting: Cardiothoracic Surgery

## 2014-01-19 DIAGNOSIS — I358 Other nonrheumatic aortic valve disorders: Secondary | ICD-10-CM

## 2014-01-21 ENCOUNTER — Ambulatory Visit (INDEPENDENT_AMBULATORY_CARE_PROVIDER_SITE_OTHER): Payer: Self-pay | Admitting: Cardiothoracic Surgery

## 2014-01-21 ENCOUNTER — Encounter: Payer: Self-pay | Admitting: Cardiothoracic Surgery

## 2014-01-21 ENCOUNTER — Ambulatory Visit
Admission: RE | Admit: 2014-01-21 | Discharge: 2014-01-21 | Disposition: A | Discharge status: Home / Self Care | Payer: BC Managed Care – PPO | Source: Ambulatory Visit | Attending: Cardiothoracic Surgery | Admitting: Cardiothoracic Surgery

## 2014-01-21 VITALS — BP 114/79 | HR 125 | Resp 20 | Ht 65.0 in | Wt 181.0 lb

## 2014-01-21 DIAGNOSIS — I358 Other nonrheumatic aortic valve disorders: Secondary | ICD-10-CM

## 2014-01-21 DIAGNOSIS — I351 Nonrheumatic aortic (valve) insufficiency: Secondary | ICD-10-CM

## 2014-01-21 DIAGNOSIS — I5189 Other ill-defined heart diseases: Secondary | ICD-10-CM

## 2014-01-21 DIAGNOSIS — I38 Endocarditis, valve unspecified: Secondary | ICD-10-CM

## 2014-01-21 DIAGNOSIS — I330 Acute and subacute infective endocarditis: Secondary | ICD-10-CM

## 2014-01-21 NOTE — Progress Notes (Signed)
[address]       [place] [address]             [phone]      [name] [facility] Medical Record #[id number] Date of Birth: 05-04-1985  Referring: [name], MD Primary Care: No primary care provider on file.  Chief Complaint:   POST OP FOLLOW UP Past Surgical History  Procedure Laterality Date  . Ascending aortic root replacement N/A 12/30/2013    Procedure: ASCENDING AORTIC ROOT REPLACEMENT with 2mm HOMOGRAFT, CLOSURE OF AORTIC TO RIGHT ATRIAL FISTULA, CLOSURE OF PATENT FORAMEN OVALE;  Surgeon: Delight Ovens, MD;  Location: [facility] OR;  Service: Open Heart Surgery;  Laterality: N/A;  . Tricuspid valve replacement N/A 12/30/2013    Procedure: REPAIR OF TRICUSPID VALVE LEAFLET;  Surgeon: Delight Ovens, MD;  Location: [facility] OR;  Service: Open Heart Surgery;  Laterality: N/A;  . 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 The patient has had no recurrent fever or chills. Overall feels well. He's had no overt symptoms of congestive heart failure. His increasing his activities appropriately. Has no pedal edema or orthopnea.    Past Medical History  Diagnosis Date  . Obesity   . Smokeless tobacco use   . Fracture of left femur   . Meningitis     Group B Strep (November 2015)  . Patent foramen ovale     Closed during procedure on December 30, 2013  . History of open heart surgery     December 30, 2013- ascending aortic root replacement, TEE  . Endocarditis     related to meningitis      History  Smoking status  . Never Smoker   Smokeless tobacco  . Current User    History    Alcohol Use  . Yes     No Known Allergies  Current Outpatient Prescriptions  Medication Sig Dispense Refill  . aspirin EC 325 MG EC tablet Take 1 tablet (325 mg total) by mouth daily. 30 tablet 0  . cefTRIAXone (ROCEPHIN) 40 MG/ML IVPB Inject 50 mLs (2 g total) into the vein every 12 (twelve) hours. Last dose is to be given 02/10/2014. 50 mL 12  . oxyCODONE (OXY IR/ROXICODONE) 5 MG immediate release tablet Take 1-2 tablets (5-10 mg total) by mouth every 4 (four) hours as needed for severe pain. 40 tablet 0   No current facility-administered medications for this visit.       Physical Exam: Ht 5\' 5"  (1.651 m)  SpO2   General appearance: alert and cooperative Neurologic: intact Heart: systolic murmur: early systolic 2/6, crescendo at 2nd right intercostal space Lungs: clear to auscultation bilaterally Abdomen: soft, non-tender; bowel sounds normal; no masses,  no organomegaly Extremities: extremities normal, atraumatic, no cyanosis or edema and Homans sign is negative, no sign of DVT Wound: Sternum is stable and well-healed   Diagnostic Studies & Laboratory data:     Recent Radiology Findings:   Dg Chest 2 View  01/21/2014   CLINICAL DATA:  Bacterial endocarditis of the aortic valve. Aortic valve replacement November 2015. Soreness over incision line which shortness of breath and cough.  EXAM: CHEST  2 VIEW  COMPARISON:  01/06/2014  FINDINGS: Sternotomy wires unchanged. Dual lead left-sided pacemaker are unchanged. There has been interval placement of a right-sided PICC line which has tip over the cavoatrial junction. Lungs are hypoinflated with improved opacification over the lung bases as there is mild residual left retrocardiac opacification. Small amount a left posterior pleural fluid. Cardiomediastinal silhouette and remainder of the exam is unchanged to include chronic deformity/irregularity over the first rib.  IMPRESSION: Improving bibasilar opacification or mild residual  left retrocardiac density. Exclude a small amount of posterior left pleural fluid.   Electronically Signed   By: Elberta Fortisaniel  Boyle M.D.   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 Continues on prescribed IV Rocephin Will allow the return to driving and 2 weeks, no heavy lifting for 3 months. We will arrange for cardiology follow-up, currently he has no appointment Patient has follow-up appointment in the ID clinic December 22 with plans to continue IV antibiotics until the end of December. Plan to see the patient back in 4-5 weeks   Delight OvensEdward B Carline Dura MD      [address] [place] [id number] Office [phone]   Beeper [phone]  01/21/2014 9:28 AM

## 2014-01-25 ENCOUNTER — Telehealth: Payer: Self-pay | Admitting: Internal Medicine

## 2014-01-25 ENCOUNTER — Encounter: Payer: Self-pay | Admitting: Internal Medicine

## 2014-01-25 DIAGNOSIS — Z48812 Encounter for surgical aftercare following surgery on the circulatory system: Secondary | ICD-10-CM

## 2014-01-25 NOTE — Telephone Encounter (Signed)
Pt has sharp intermittent pain in left forearm starting yesterday. Pain is 2/10 on scale. Pt feels throbbing more when his arm is straightened or stretched. I asked pt how much movement (or non-movement) he has employed. Pt has been leaving his left arm bent since implant. I encouraged pt to use his left arm in full movement including hair washing, tying shoes, etc. Pt knows he can lift up to 15lbs limit at this point w/ a 5lb increase each week hereafter. Pt agreed to start using his arm more.    Pt will call if further concerns arise.

## 2014-01-25 NOTE — Telephone Encounter (Signed)
Pt having pain in left arm, denies chest pain, no dizziness, SOB when walking long distance, wound site looks clear, pls call  [phone]

## 2014-01-26 ENCOUNTER — Encounter (HOSPITAL_COMMUNITY): Payer: Self-pay

## 2014-01-26 ENCOUNTER — Telehealth: Payer: Self-pay | Admitting: *Deleted

## 2014-01-26 LAB — FUNGUS CULTURE W SMEAR
Fungal Smear: NONE SEEN
Fungal Smear: NONE SEEN

## 2014-01-26 NOTE — Telephone Encounter (Signed)
stomach upset while taking IV antibiotic.  Has tried PeptoBismol.  MD please advise the pt.  Has not been seen in the clinic yet.

## 2014-01-27 ENCOUNTER — Other Ambulatory Visit: Payer: Self-pay | Admitting: Internal Medicine

## 2014-01-27 DIAGNOSIS — R110 Nausea: Secondary | ICD-10-CM

## 2014-01-27 MED ORDER — PROMETHAZINE HCL 25 MG PO TABS: 25.0000 mg | ORAL_TABLET | Freq: Four times a day (QID) | ORAL | Status: DC | PRN

## 2014-01-27 MED ORDER — PROMETHAZINE HCL 25 MG PO TABS
25.0000 mg | ORAL_TABLET | Freq: Four times a day (QID) | ORAL | Status: DC | PRN
Start: 2014-01-27 — End: 2014-02-02

## 2014-01-27 NOTE — Progress Notes (Signed)
Patient is nearly 2 wk from finishing his antibiotics for gbs endocarditis. He reports having nausea a few minutes after starting ceftriaxone injection. He also subscribes to loss of appetite and poor caloric intake.  i have instructed how to take phenergan. Also recommended ways to increase his caloric intake with supplemental protein foods. Some of this will improve once he finishes antibiotics and can regain some of the weight he has lost.  Has clinic appt on 12/22. i asked him to call in 1-2 days if phenergan is not working. Then can do trial with zofran.

## 2014-01-27 NOTE — Telephone Encounter (Signed)
i spoke to pt and gave rx for phenergan

## 2014-02-02 ENCOUNTER — Encounter: Payer: Self-pay | Admitting: Infectious Disease

## 2014-02-02 ENCOUNTER — Ambulatory Visit (INDEPENDENT_AMBULATORY_CARE_PROVIDER_SITE_OTHER): Payer: BC Managed Care – PPO | Admitting: Infectious Disease

## 2014-02-02 VITALS — BP 122/82 | HR 111 | Temp 98.0°F | Wt 183.0 lb

## 2014-02-02 DIAGNOSIS — D696 Thrombocytopenia, unspecified: Secondary | ICD-10-CM

## 2014-02-02 DIAGNOSIS — I330 Acute and subacute infective endocarditis: Secondary | ICD-10-CM

## 2014-02-02 DIAGNOSIS — I442 Atrioventricular block, complete: Secondary | ICD-10-CM

## 2014-02-02 DIAGNOSIS — G060 Intracranial abscess and granuloma: Secondary | ICD-10-CM

## 2014-02-02 DIAGNOSIS — G002 Streptococcal meningitis: Secondary | ICD-10-CM

## 2014-02-02 DIAGNOSIS — A401 Sepsis due to streptococcus, group B: Secondary | ICD-10-CM | POA: Diagnosis not present

## 2014-02-02 DIAGNOSIS — I358 Other nonrheumatic aortic valve disorders: Secondary | ICD-10-CM

## 2014-02-02 NOTE — Progress Notes (Signed)
   Subjective:    Patient ID: [name], male    DOB: 05/17/1985, 28 y.o.   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 Valve sent to [facility] showed GBS by ribosomal 16S sequencing.   He has had trouble tolerating the high dose ceftriaxone and also completed 2 weeks of gentamicin. He is now getting used to the ceftriaxone though he still has abdominal pain with infusions. Anti-emetics have made no difference in ssx nor have antacids.    Review of Systems  Constitutional: Positive for fatigue and unexpected weight change. Negative for fever, chills, diaphoresis, activity change and appetite change.  HENT: Negative for congestion, rhinorrhea, sinus pressure, sneezing, sore throat and trouble swallowing.   Eyes: Negative for photophobia and visual disturbance.  Respiratory: Negative for cough, chest tightness, shortness of breath, wheezing and stridor.   Cardiovascular: Negative for chest pain, palpitations and leg swelling.  Gastrointestinal: Positive for abdominal pain. Negative for nausea, vomiting, diarrhea, constipation, blood in stool and abdominal distention.  Genitourinary: Negative for dysuria, hematuria, flank pain and difficulty urinating.  Musculoskeletal: Negative for myalgias, back pain, joint swelling, arthralgias and gait problem.  Skin: Negative for color change, pallor, rash and wound.  Neurological: Negative for dizziness, tremors, weakness and light-headedness.  Hematological: Negative for adenopathy. Does not bruise/bleed easily.  Psychiatric/Behavioral: Negative for behavioral problems, confusion, sleep disturbance, dysphoric mood, decreased concentration  and agitation.       Objective:   Physical Exam  Constitutional: He is oriented to person, place, and time. He appears well-developed and well-nourished.  HENT:  Head: Normocephalic and atraumatic.  Eyes: Conjunctivae and EOM are normal.  Neck: Normal range of motion. Neck supple.  Cardiovascular: Normal rate and regular rhythm.   Murmur heard.  Systolic murmur is present with a grade of 3/6  Loud murmur throughout precordium   Pulmonary/Chest: Effort normal. No respiratory distress. He has no wheezes.  Abdominal: Soft. He exhibits no distension.  Musculoskeletal: Normal range of motion. He exhibits no edema or tenderness.  Neurological: He is alert and oriented to person, place, and time.  Skin: Skin is warm and dry. No rash noted. No erythema. No pallor.  Psychiatric: He has a normal mood and affect. His behavior is normal. Judgment and thought content normal.   PM site clean 02/02/14      Sternotomy clean 02/02/14:      PICC line with minimal dried blood at insertion site          Assessment & Plan:   Group B streptococcal  Aortic valve endocarditis and Aorto-Right Atrial fistula sp AVR, TV repair, repair of Aorto to Right atrial fistula, placement of PM due to 3rd degree heart block  --I would like to extend his IV antibiotics until January 6th (would give him 8 weeks total including one week pre-surgery and 7 weeks postop --I would like to reimage his brain and will get a CT with contrast to re-evaluate this prior to stopping abx --still not clear what source was for this infection  I spent greater than 25 minutes with the patient including greater than 50% of time in face to face counsel of the patient and in coordination of their care.

## 2014-02-08 ENCOUNTER — Telehealth: Payer: Self-pay | Admitting: *Deleted

## 2014-02-08 ENCOUNTER — Encounter: Payer: Self-pay | Admitting: Internal Medicine

## 2014-02-08 ENCOUNTER — Telehealth: Payer: Self-pay | Admitting: Licensed Clinical Social Worker

## 2014-02-08 NOTE — Telephone Encounter (Signed)
Confirming completion date for antibiotic, 02/17/13.  RN reviewed last office visit notes.  Stop date confirmed as 02/17/13.  Will need an order for D/C PICC from MD.

## 2014-02-08 NOTE — Telephone Encounter (Signed)
Advised girlfriend Sirena of patient's appointment to have CT scan tomorrow at 12 noon. No food 4 hrs prior. Appointment is at [facility] phone# [phone]. Advised to call and reschedule if time doesn't work for him.

## 2014-02-08 NOTE — Telephone Encounter (Signed)
Pt is scheduled for CT on Wed., Dec. 30 per Tamika.  Next appt at [facility] w/ Dr. Daiva Eves 03/03/14.  [facility] notified of MD needing to review CT prior to decision on "stop date" for antibiotics and pulling PICC.

## 2014-02-08 NOTE — Telephone Encounter (Signed)
We need a CT of his head FIRST so we will have to give him the antibiotics until we have that CT with contrast showing improvement in his brain abscesses

## 2014-02-09 ENCOUNTER — Ambulatory Visit (HOSPITAL_BASED_OUTPATIENT_CLINIC_OR_DEPARTMENT_OTHER)
Admission: RE | Admit: 2014-02-09 | Discharge: 2014-02-09 | Disposition: A | Discharge status: Home / Self Care | Payer: BC Managed Care – PPO | Source: Ambulatory Visit | Attending: Infectious Disease | Admitting: Infectious Disease

## 2014-02-09 ENCOUNTER — Encounter (HOSPITAL_BASED_OUTPATIENT_CLINIC_OR_DEPARTMENT_OTHER): Payer: Self-pay

## 2014-02-09 DIAGNOSIS — G009 Bacterial meningitis, unspecified: Secondary | ICD-10-CM | POA: Insufficient documentation

## 2014-02-09 DIAGNOSIS — G060 Intracranial abscess and granuloma: Secondary | ICD-10-CM | POA: Diagnosis not present

## 2014-02-09 MED ORDER — IOHEXOL 300 MG/ML  SOLN
80.0000 mL | Freq: Once | INTRAMUSCULAR | Status: AC | PRN
  Administered 2014-02-09: 80 mL via INTRAVENOUS

## 2014-02-10 ENCOUNTER — Telehealth: Payer: Self-pay | Admitting: *Deleted

## 2014-02-10 ENCOUNTER — Ambulatory Visit: Payer: BC Managed Care – PPO

## 2014-02-10 ENCOUNTER — Telehealth: Payer: Self-pay | Admitting: Internal Medicine

## 2014-02-10 ENCOUNTER — Telehealth: Payer: Self-pay | Admitting: Cardiology

## 2014-02-10 ENCOUNTER — Other Ambulatory Visit: Payer: Self-pay

## 2014-02-10 DIAGNOSIS — F458 Other somatoform disorders: Secondary | ICD-10-CM

## 2014-02-10 DIAGNOSIS — R059 Cough, unspecified: Secondary | ICD-10-CM

## 2014-02-10 DIAGNOSIS — R05 Cough: Secondary | ICD-10-CM

## 2014-02-10 DIAGNOSIS — R000 Tachycardia, unspecified: Secondary | ICD-10-CM

## 2014-02-10 NOTE — Telephone Encounter (Signed)
New message  Pt called requests a call back to see if signal was received. Please call

## 2014-02-10 NOTE — Telephone Encounter (Signed)
Informed pt that transmission was received. And that a device tech would look at transmission to determine if it was normal b/c pt was complaining about high heart rates while he is sitting down.

## 2014-02-10 NOTE — Telephone Encounter (Signed)
Remote transmission received showing heart rates above 120 bpm about 40-45% of time. Per pt resting HR 120-130s and SOB since Christmas day. Per [facility] order Echo and TSH--orders put in. Pt to be scheduled sooner than 04-06-14 appointment with [facility] after results from echo/lab. Pt also has appointment with Dr Tyrone Sage tomorrow and chest xray.

## 2014-02-10 NOTE — Telephone Encounter (Signed)
Pt c/o Shortness Of Breath: STAT if SOB developed within the last 24 hours or pt is noticeably SOB on the phone  1. Are you currently SOB (can you hear that pt is SOB on the phone)? Yes  2. How long have you been experiencing SOB? 02/05/2014 but has gradually worsening  3. Are you SOB when sitting or when up moving around? SOB at rest and with exertion  4. Are you currently experiencing any other symptoms? Mild abdominal pain, but pt feels this may be related to antibiotic use   Pt recent had open heart surgery 12/30/2013.

## 2014-02-10 NOTE — Telephone Encounter (Signed)
Pt. Has an appt. With Dr. Lowella Fairy in the AM to get a cxr and to see DR. Lowella Fairy

## 2014-02-10 NOTE — Telephone Encounter (Signed)
Pt asking about CT results and discontinuing ABX and PICC.  Results available for review.  02/17/14 will be 8 weeks of IV ABX.  Pt has f/u appt on 03/03/14 @ 2:45 PM.   Pt also stated that he has "stomach pain" 7 out of 10 after each dose of rocephin.  Pain is so severe that he can barely eat/drink anything. Pt also stated that his is "out of breath" even while sitting down and doing nothing.  He is also fatigued.  He had is pacemaker checked.  RN asked him whether he reported these symptoms to his cardiologist and he had not.  RN advised the pt to call his cardiologist, Dr. Jens Som, to report these symptoms.  Pt verbalized that he would call Dr. Jens Som.  Pt given telephone number and directions to his office.

## 2014-02-11 ENCOUNTER — Observation Stay (HOSPITAL_COMMUNITY): Payer: BC Managed Care – PPO

## 2014-02-11 ENCOUNTER — Inpatient Hospital Stay (HOSPITAL_COMMUNITY)
Admission: AD | Admit: 2014-02-11 | Discharge: 2014-02-18 | DRG: 216 | Disposition: A | Discharge status: Home Health | Payer: BC Managed Care – PPO | Source: Ambulatory Visit | Attending: Thoracic Surgery (Cardiothoracic Vascular Surgery) | Admitting: Thoracic Surgery (Cardiothoracic Vascular Surgery)

## 2014-02-11 ENCOUNTER — Ambulatory Visit
Admission: RE | Admit: 2014-02-11 | Discharge: 2014-02-11 | Disposition: A | Discharge status: Home / Self Care | Payer: BC Managed Care – PPO | Source: Ambulatory Visit | Attending: Cardiothoracic Surgery | Admitting: Cardiothoracic Surgery

## 2014-02-11 ENCOUNTER — Encounter: Payer: Self-pay | Admitting: Cardiothoracic Surgery

## 2014-02-11 ENCOUNTER — Ambulatory Visit (INDEPENDENT_AMBULATORY_CARE_PROVIDER_SITE_OTHER): Payer: Self-pay | Admitting: Cardiothoracic Surgery

## 2014-02-11 ENCOUNTER — Encounter (HOSPITAL_COMMUNITY): Payer: Self-pay | Admitting: General Practice

## 2014-02-11 VITALS — BP 116/82 | HR 127 | Resp 20 | Ht 65.0 in | Wt 183.0 lb

## 2014-02-11 DIAGNOSIS — Z87891 Personal history of nicotine dependence: Secondary | ICD-10-CM

## 2014-02-11 DIAGNOSIS — A491 Streptococcal infection, unspecified site: Secondary | ICD-10-CM | POA: Insufficient documentation

## 2014-02-11 DIAGNOSIS — J9621 Acute and chronic respiratory failure with hypoxia: Secondary | ICD-10-CM | POA: Diagnosis present

## 2014-02-11 DIAGNOSIS — I351 Nonrheumatic aortic (valve) insufficiency: Secondary | ICD-10-CM

## 2014-02-11 DIAGNOSIS — I369 Nonrheumatic tricuspid valve disorder, unspecified: Secondary | ICD-10-CM

## 2014-02-11 DIAGNOSIS — D696 Thrombocytopenia, unspecified: Secondary | ICD-10-CM | POA: Diagnosis present

## 2014-02-11 DIAGNOSIS — I330 Acute and subacute infective endocarditis: Secondary | ICD-10-CM | POA: Diagnosis present

## 2014-02-11 DIAGNOSIS — Z8661 Personal history of infections of the central nervous system: Secondary | ICD-10-CM

## 2014-02-11 DIAGNOSIS — R000 Tachycardia, unspecified: Secondary | ICD-10-CM | POA: Diagnosis present

## 2014-02-11 DIAGNOSIS — Q210 Ventricular septal defect: Secondary | ICD-10-CM

## 2014-02-11 DIAGNOSIS — R0609 Other forms of dyspnea: Secondary | ICD-10-CM | POA: Diagnosis present

## 2014-02-11 DIAGNOSIS — I5031 Acute diastolic (congestive) heart failure: Secondary | ICD-10-CM | POA: Diagnosis present

## 2014-02-11 DIAGNOSIS — R05 Cough: Secondary | ICD-10-CM

## 2014-02-11 DIAGNOSIS — D62 Acute posthemorrhagic anemia: Secondary | ICD-10-CM | POA: Diagnosis present

## 2014-02-11 DIAGNOSIS — I340 Nonrheumatic mitral (valve) insufficiency: Secondary | ICD-10-CM | POA: Diagnosis present

## 2014-02-11 DIAGNOSIS — I38 Endocarditis, valve unspecified: Secondary | ICD-10-CM

## 2014-02-11 DIAGNOSIS — T826XXA Infection and inflammatory reaction due to cardiac valve prosthesis, initial encounter: Principal | ICD-10-CM | POA: Diagnosis present

## 2014-02-11 DIAGNOSIS — R0600 Dyspnea, unspecified: Secondary | ICD-10-CM | POA: Diagnosis present

## 2014-02-11 DIAGNOSIS — Z792 Long term (current) use of antibiotics: Secondary | ICD-10-CM

## 2014-02-11 DIAGNOSIS — R059 Cough, unspecified: Secondary | ICD-10-CM

## 2014-02-11 DIAGNOSIS — G009 Bacterial meningitis, unspecified: Secondary | ICD-10-CM | POA: Diagnosis present

## 2014-02-11 DIAGNOSIS — R011 Cardiac murmur, unspecified: Secondary | ICD-10-CM | POA: Diagnosis present

## 2014-02-11 DIAGNOSIS — I272 Other secondary pulmonary hypertension: Secondary | ICD-10-CM | POA: Diagnosis present

## 2014-02-11 DIAGNOSIS — Z7982 Long term (current) use of aspirin: Secondary | ICD-10-CM

## 2014-02-11 DIAGNOSIS — I358 Other nonrheumatic aortic valve disorders: Secondary | ICD-10-CM

## 2014-02-11 DIAGNOSIS — Z6829 Body mass index (BMI) 29.0-29.9, adult: Secondary | ICD-10-CM

## 2014-02-11 DIAGNOSIS — Q211 Atrial septal defect: Secondary | ICD-10-CM

## 2014-02-11 DIAGNOSIS — F458 Other somatoform disorders: Secondary | ICD-10-CM

## 2014-02-11 DIAGNOSIS — I5189 Other ill-defined heart diseases: Secondary | ICD-10-CM

## 2014-02-11 DIAGNOSIS — Z954 Presence of other heart-valve replacement: Secondary | ICD-10-CM

## 2014-02-11 DIAGNOSIS — Z9889 Other specified postprocedural states: Secondary | ICD-10-CM

## 2014-02-11 DIAGNOSIS — E669 Obesity, unspecified: Secondary | ICD-10-CM | POA: Diagnosis present

## 2014-02-11 DIAGNOSIS — J9601 Acute respiratory failure with hypoxia: Secondary | ICD-10-CM

## 2014-02-11 DIAGNOSIS — I071 Rheumatic tricuspid insufficiency: Secondary | ICD-10-CM

## 2014-02-11 DIAGNOSIS — Z950 Presence of cardiac pacemaker: Secondary | ICD-10-CM | POA: Diagnosis present

## 2014-02-11 DIAGNOSIS — Z8774 Personal history of (corrected) congenital malformations of heart and circulatory system: Secondary | ICD-10-CM

## 2014-02-11 DIAGNOSIS — Z862 Personal history of diseases of the blood and blood-forming organs and certain disorders involving the immune mechanism: Secondary | ICD-10-CM

## 2014-02-11 DIAGNOSIS — D689 Coagulation defect, unspecified: Secondary | ICD-10-CM | POA: Diagnosis present

## 2014-02-11 DIAGNOSIS — J9811 Atelectasis: Secondary | ICD-10-CM

## 2014-02-11 DIAGNOSIS — I509 Heart failure, unspecified: Secondary | ICD-10-CM

## 2014-02-11 HISTORY — DX: Rheumatic tricuspid insufficiency: I07.1

## 2014-02-11 HISTORY — DX: Other specified postprocedural states: Z98.890

## 2014-02-11 HISTORY — DX: Ventricular septal defect: Q21.0

## 2014-02-11 HISTORY — DX: Thrombocytopenia, unspecified: D69.6

## 2014-02-11 HISTORY — DX: Personal history of (corrected) congenital malformations of heart and circulatory system: Z87.74

## 2014-02-11 HISTORY — DX: Presence of other heart-valve replacement: Z95.4

## 2014-02-11 HISTORY — DX: Presence of cardiac pacemaker: Z95.0

## 2014-02-11 HISTORY — DX: Acute kidney failure, unspecified: N17.9

## 2014-02-11 LAB — COMPREHENSIVE METABOLIC PANEL
ALK PHOS: 93 U/L (ref 39–117)
ALT: 165 U/L — AB (ref 0–53)
AST: 147 U/L — AB (ref 0–37)
Albumin: 3.2 g/dL — ABNORMAL LOW (ref 3.5–5.2)
Anion gap: 12 (ref 5–15)
BUN: 20 mg/dL (ref 6–23)
CO2: 24 mmol/L (ref 19–32)
Calcium: 8.7 mg/dL (ref 8.4–10.5)
Chloride: 102 mEq/L (ref 96–112)
Creatinine, Ser: 1.18 mg/dL (ref 0.50–1.35)
GFR calc Af Amer: >90 mL/min (ref >90)
GFR, EST NON AFRICAN AMERICAN: 84 mL/min — AB (ref >90)
GLUCOSE: 88 mg/dL (ref 70–99)
Potassium: 3.8 mmol/L (ref 3.5–5.1)
SODIUM: 138 mmol/L (ref 135–145)
Total Bilirubin: 0.7 mg/dL (ref 0.3–1.2)
Total Protein: 5.7 g/dL — ABNORMAL LOW (ref 6.0–8.3)

## 2014-02-11 LAB — CBC WITH DIFFERENTIAL/PLATELET
BASOS PCT: 1 % (ref 0–1)
Basophils Absolute: 0.1 10*3/uL (ref 0.0–0.1)
EOS ABS: 0.4 10*3/uL (ref 0.0–0.7)
Eosinophils Relative: 4 % (ref 0–5)
HCT: 28.9 % — ABNORMAL LOW (ref 39.0–52.0)
Hemoglobin: 9.5 g/dL — ABNORMAL LOW (ref 13.0–17.0)
LYMPHS ABS: 1.3 10*3/uL (ref 0.7–4.0)
LYMPHS PCT: 14 % (ref 12–46)
MCH: 26.5 pg (ref 26.0–34.0)
MCHC: 32.9 g/dL (ref 30.0–36.0)
MCV: 80.7 fL (ref 78.0–100.0)
MONOS PCT: 15 % — AB (ref 3–12)
Monocytes Absolute: 1.3 10*3/uL — ABNORMAL HIGH (ref 0.1–1.0)
NEUTROS PCT: 66 % (ref 43–77)
Neutro Abs: 6.1 10*3/uL (ref 1.7–7.7)
PLATELETS: 318 10*3/uL (ref 150–400)
RBC: 3.58 MIL/uL — ABNORMAL LOW (ref 4.22–5.81)
RDW: 16.2 % — AB (ref 11.5–15.5)
WBC: 9.2 10*3/uL (ref 4.0–10.5)

## 2014-02-11 LAB — URINALYSIS, ROUTINE W REFLEX MICROSCOPIC
BILIRUBIN URINE: NEGATIVE
Glucose, UA: NEGATIVE mg/dL
HGB URINE DIPSTICK: NEGATIVE
KETONES UR: 15 mg/dL — AB
LEUKOCYTES UA: NEGATIVE
NITRITE: NEGATIVE
Protein, ur: 30 mg/dL — AB
SPECIFIC GRAVITY, URINE: 1.02 (ref 1.005–1.030)
Urobilinogen, UA: 0.2 mg/dL (ref 0.0–1.0)
pH: 5 (ref 5.0–8.0)

## 2014-02-11 LAB — URINE MICROSCOPIC-ADD ON

## 2014-02-11 LAB — MRSA PCR SCREENING: MRSA BY PCR: NEGATIVE

## 2014-02-11 LAB — MAGNESIUM: MAGNESIUM: 1.7 mg/dL (ref 1.5–2.5)

## 2014-02-11 LAB — TSH: TSH: 2.562 u[IU]/mL (ref 0.350–4.500)

## 2014-02-11 LAB — D-DIMER, QUANTITATIVE (NOT AT ARMC): D DIMER QUANT: 6.41 ug{FEU}/mL — AB (ref 0.00–0.48)

## 2014-02-11 LAB — BRAIN NATRIURETIC PEPTIDE: B Natriuretic Peptide: 617.2 pg/mL — ABNORMAL HIGH (ref 0.0–100.0)

## 2014-02-11 MED ORDER — ENSURE COMPLETE PO LIQD
237.0000 mL | Freq: Two times a day (BID) | ORAL | Status: DC
  Administered 2014-02-11: 237 mL via ORAL

## 2014-02-11 MED ORDER — ZOLPIDEM TARTRATE 5 MG PO TABS: 5.0000 mg | ORAL_TABLET | Freq: Every evening | ORAL | Status: DC | PRN

## 2014-02-11 MED ORDER — SODIUM CHLORIDE 0.9 % IJ SOLN: 3.0000 mL | Freq: Two times a day (BID) | INTRAMUSCULAR | Status: DC

## 2014-02-11 MED ORDER — IOHEXOL 350 MG/ML SOLN
100.0000 mL | Freq: Once | INTRAVENOUS | Status: AC | PRN
  Administered 2014-02-11: 100 mL via INTRAVENOUS

## 2014-02-11 MED ORDER — ACETAMINOPHEN 325 MG PO TABS: 650.0000 mg | ORAL_TABLET | ORAL | Status: DC | PRN

## 2014-02-11 MED ORDER — SODIUM CHLORIDE 0.9 % IJ SOLN: 3.0000 mL | INTRAMUSCULAR | Status: DC | PRN

## 2014-02-11 MED ORDER — CEFTRIAXONE SODIUM IN DEXTROSE 20 MG/ML IV SOLN: 1.0000 g | INTRAVENOUS | Status: DC

## 2014-02-11 MED ORDER — SODIUM CHLORIDE 0.9 % IJ SOLN
10.0000 mL | INTRAMUSCULAR | Status: DC | PRN
  Administered 2014-02-11 (×3): 10 mL
  Filled 2014-02-11 (×2): qty 40

## 2014-02-11 MED ORDER — METOPROLOL TARTRATE 12.5 MG HALF TABLET
12.5000 mg | ORAL_TABLET | Freq: Two times a day (BID) | ORAL | Status: DC
  Filled 2014-02-11 (×2): qty 1

## 2014-02-11 MED ORDER — SODIUM CHLORIDE 0.9 % IV SOLN
250.0000 mL | INTRAVENOUS | Status: DC | PRN
  Administered 2014-02-11: 250 mL via INTRAVENOUS

## 2014-02-11 MED ORDER — CEFTRIAXONE SODIUM IN DEXTROSE 40 MG/ML IV SOLN
2.0000 g | Freq: Two times a day (BID) | INTRAVENOUS | Status: DC
  Administered 2014-02-11 – 2014-02-18 (×13): 2 g via INTRAVENOUS
  Filled 2014-02-11 (×19): qty 50

## 2014-02-11 MED ORDER — ONDANSETRON HCL 4 MG/2ML IJ SOLN: 4.0000 mg | Freq: Four times a day (QID) | INTRAMUSCULAR | Status: DC | PRN

## 2014-02-11 NOTE — Progress Notes (Signed)
On admission, the General Medical Clinical Path plan of care was initiated in error, and therefore, is not applicable, as physician ordered the Heart Failure Pathway plan of care be initiated.

## 2014-02-11 NOTE — Progress Notes (Signed)
MD notified pt day shift RN that pt to be transferred to higher level of care based off of results from ECHO. Pt resting comfortably in room, no pain at this time.

## 2014-02-11 NOTE — Progress Notes (Signed)
Spoke with Dr. Pricilla Holm, she states that she will be transferring pt to CCU based on ECCO results, pt's has severe pulmonary hypertension.  Pt is currently stable, with HR of 119.  MD aware.

## 2014-02-11 NOTE — H&P (Signed)
Cardiologist:   Caine Siharath is an 28 y.o. male.   Chief Complaint: SOB HPI:  The patient is a 28 yo male with a history of recent Group B Streptococcus meningitis (with brain micro abscesses), endocarditis resulting in aortic root abscess and subsequent emergent root replacement, moderate TR, AKI, thrombocytopenia(DIC).   He underwent PPM(Medtronic) implant on 01/04/14 for complete heart block.  Most recent echo was a TEE on 12/30/13: EF 50-55% normal wall motion.   He had a Ct of the head on 12/29 which revealed normal appearance.   He was seen today by Dr. Tyrone Sage with increasing SOB.   He reports doing very well after surgery.  The patient reports developing acute dyspnea on exertion one week ago.  The second and third day it got worse but then has been the same since.  No LEE, orthopnea, PND.  He has not slept for the last two days.  He vomited yesterday but has a chronic nausea issue with smells.  Mild abd pain in the suprapubic region.   No CP beyond what he experienced from the surgery.  The patient currently denies fever, orthopnea, dizziness, PND, cough, congestion,  hematochezia, melena, lower extremity edema, claudication.   Past Medical History  Diagnosis Date  . Obesity   . Smokeless tobacco use   . Fracture of left femur   . Meningitis     Group B Strep (November 2015)  . Patent foramen ovale     Closed during procedure on December 30, 2013  . History of open heart surgery     December 30, 2013- ascending aortic root replacement, TEE  . Endocarditis     related to meningitis   . Brain abscess     Past Surgical History  Procedure Laterality Date  . Ascending aortic root replacement N/A 12/30/2013    Procedure: ASCENDING AORTIC ROOT REPLACEMENT with 66mm HOMOGRAFT, CLOSURE OF AORTIC TO RIGHT ATRIAL FISTULA, CLOSURE OF PATENT FORAMEN OVALE;  Surgeon: Delight Ovens, MD;  Location: [facility] OR;  Service: Open Heart Surgery;  Laterality: N/A;  . Tricuspid valve replacement N/A  12/30/2013    Procedure: REPAIR OF TRICUSPID VALVE LEAFLET;  Surgeon: Delight Ovens, MD;  Location: [facility] OR;  Service: Open Heart Surgery;  Laterality: N/A;  . Intraoperative transesophageal echocardiogram N/A 12/30/2013    Procedure: INTRAOPERATIVE TRANSESOPHAGEAL ECHOCARDIOGRAM;  Surgeon: Delight Ovens, MD;  Location: [facility] OR;  Service: Open Heart Surgery;  Laterality: N/A;  . Permanent pacemaker insertion N/A 01/04/2014    Procedure: PERMANENT PACEMAKER INSERTION;  Surgeon: Marinus Maw, MD;  Location: [facility] CATH LAB;  Service: Cardiovascular;  Laterality: N/A;    Family History  Problem Relation Age of Onset  . Heart murmur Brother    Social History:  reports that he has never smoked. He uses smokeless tobacco. He reports that he drinks alcohol. He reports that he does not use illicit drugs.  Allergies: No Known Allergies  Medications Prior to Admission  Medication Sig Dispense Refill  . aspirin EC 325 MG EC tablet Take 1 tablet (325 mg total) by mouth daily. 30 tablet 0  . cefTRIAXone (ROCEPHIN) 40 MG/ML IVPB Inject 50 mLs (2 g total) into the vein every 12 (twelve) hours. Last dose is to be given 02/10/2014. 50 mL 12    No results found for this or any previous visit (from the past 48 hour(s)). Dg Chest 2 View  02/11/2014   CLINICAL DATA:  Shortness of breath and cough history of tricuspid  valve replacement and aortic root replacement in November 2015; history of bacterial meningitis  EXAM: CHEST  2 VIEW  COMPARISON:  PA and lateral chest x-ray of January 21, 2014 and January 06, 2014.  FINDINGS: The lungs are hypoinflated. There are coarse interstitial markings bilaterally. The cardiac silhouette is enlarged. The central pulmonary vascularity is prominent. There is no pleural effusion. The permanent pacemaker is in reasonable position radiographically. The right-sided PICC line tip projects over the junction of the middle and distal thirds of the SVC. There are 7 intact sternal  wires present.  IMPRESSION: There is bilateral pulmonary hypoinflation. The central pulmonary vascularity is mildly engorged consistent with CHF. This is not greatly changed from the December 10th study. Chronically increased density in the left retrocardiac region on the left is consistent with atelectasis.   Electronically Signed   By: David  Swazil[place]Jordan   On: 02/11/2014 08:44   Ct Head W Wo Contrast  02/09/2014   CLINICAL DATA:  Bacterial meningitis with multiple brain abscesses.  EXAM: CT HEAD WITHOUT AND WITH CONTRAST  TECHNIQUE: Contiguous axial images were obtained from the base of the skull through the vertex without and with intravenous contrast  CONTRAST:  80mL OMNIPAQUE IOHEXOL 300 MG/ML  SOLN  COMPARISON:  MRI brain 12/24/2013.  FINDINGS: No acute cortical infarct, hemorrhage, or mass lesion is present. The ventricles are of normal size. No significant extra-axial fluid collection is present.  The postcontrast images demonstrate no pathologic enhancement. Of note, the previously described microabscesses are likely below the resolution of CT, even with contrast.  The paranasal sinuses and mastoid air cells are clear. The osseous skull is intact.  IMPRESSION: 1. Normal CT appearance of the brain without and with contrast. 2. Of note, CT without and with contrast is less sensitive for the evaluation of persistent microabscess than MRI.   Electronically Signed   By: Gennette Pachris  Mattern M.D.   On: 02/09/2014 12:24    Review of Systems  Constitutional: Negative for fever, chills and diaphoresis.  HENT: Negative for congestion and sore throat.   Respiratory: Positive for shortness of breath. Negative for cough.   Cardiovascular: Positive for chest pain (Sore at sternal wound).  Gastrointestinal: Positive for nausea, vomiting and abdominal pain (Mild). Negative for blood in stool and melena.  Genitourinary: Negative for hematuria.  Musculoskeletal: Negative for myalgias.  Neurological: Positive for  weakness. Negative for dizziness.  All other systems reviewed and are negative.   There were no vitals taken for this visit. Physical Exam  Nursing note and vitals reviewed. Constitutional: He is oriented to person, place, and time. He appears well-developed and well-nourished. No distress.  HENT:  Head: Normocephalic and atraumatic.  Eyes: EOM are normal. Pupils are equal, round, and reactive to light.  Neck: Normal range of motion. Neck supple. No JVD present.  Cardiovascular: Regular rhythm, S1 normal and S2 normal.  Tachycardia present.   Murmur heard.  Systolic murmur is present with a grade of 3/6  Pulses:      Radial pulses are 1+ on the right side, and 2+ on the left side.       Dorsalis pedis pulses are 2+ on the right side, and 2+ on the left side.  LSB MM  Respiratory: Effort normal. He has no wheezes. He has no rales.  Decreased BS in > R base  GI: Soft. Bowel sounds are normal. He exhibits no distension. There is no tenderness.  Musculoskeletal: He exhibits no edema.  Lymphadenopathy:  He has no cervical adenopathy.  Neurological: He is alert and oriented to person, place, and time. He exhibits normal muscle tone.  Skin: Skin is warm and dry.  Psychiatric: He has a normal mood and affect.     Assessment/Plan    Dyspnea on exertion   Meningitis due to bacterium: November 2015   Endocarditis of aortic valve-November 2015   Pacemaker-medtronic.    SP aortic root replacement   Tachycardia  Plan:   28 yo male with a history of recent Group B Streptococcus meningitis (with brain micro abscesses), endocarditis resulting in aortic root abscess and subsequent emergent root replacement, repair of Septal leaflet tricuspid valve, closure of PFO, moderate TR, AKI, thrombocytopenia(DIC).   He underwent PPM(Medtronic) implant on 01/04/14 for complete heart block.  He returns with acute DOE one the last week.  He is tachycardiac more than previously, 110's.  He has a 3/6 sys MM  which was described as 2/6 prior to DC.  May be louder with tachycardia.   CXR shows mild CHF, hypoinflation.  He is not orthopneic nor does he have LEE.  I do not think this is all related to acute CHF.    Will check Ddimer and stat echo.  Concern for PE.  He just had a contrast CT of the head which showed normal appearance.  CMP, CBC, BNP.  Consider low dose BB however, BP is soft.    After reading Dr. Clinton Gallant note, he wanted to continue abx.  Will do so and contact on-call MD.    Wilburt Finlay, PA-C 02/11/2014, 11:31 AM  I have personally seen, interviewed and examined patient and agree with note as outlined above. He says that he has been tachycardic as high as 117bpm since his discharge.  Was noted to have a heart mumur by ID 2 weeks ago.  Now with increasing SOB with exertion only and exertional fatigue.  He can lay flat without any problems.  Seen by Dr. Tyrone Sage today and noted to have very loud murmur.  Now admitted for further workup.  He is tachycardic at 120bpm at baseline.  He denies any fever, chills, vomiting, diarrhea but has had a dry cough.  Chest xray with mild CHF and hypoinflation.  Agree with stat D-Dimer to rule out acute PE.  Will get a 2D echo to assess valves.  Will hole on adding BB until d-dimer back and 2D echo done.  Signed: Armanda Magic, MD [facility] 02/11/2014

## 2014-02-11 NOTE — Progress Notes (Signed)
eICU new arrival camera care    ICD-9-CM ICD-10-CM   1. Acute respiratory failure with hypoxia 518.81 J96.01   2. Dyspnea 786.09 R06.00 CT Angio Chest PE W/Cm &/Or Wo Cm     CT Angio Chest PE W/Cm &/Or Wo Cm    Patient Active Problem List   Diagnosis Date Noted  . Pacemaker-medtronic.  02/11/2014  . Dyspnea on exertion 02/11/2014  . Acute CHF 01/04/2014  . Hypoxia   . Sepsis   . Third degree heart block 12/31/2013  . Endocarditis of aortic valve-November 2015   . Severe aortic insufficiency 12/30/2013  . S/P aortic valve allograft 12/30/2013  . Acute respiratory failure   . Acute respiratory failure with hypoxia 12/29/2013  . Acute on chronic respiratory failure with hypoxia   . HCAP (healthcare-associated pneumonia)   . Acute systolic congestive heart failure   . Meningitis   . Bacterial endocarditis   . Bilateral edema of lower extremity   . Brain abscess   . Sepsis due to group B Streptococcus   . LFT elevation   . Meningitis, streptococcal   . Headache   . Pain in joint, lower leg   . Thrombocytopenia 12/23/2013  . Meningitis due to bacterium: November 2015 12/23/2013  . Acute renal failure syndrome   . Dehydration      Cmaera exam  - looks stable - but Tachypneic and mildly tachycardic   PULMONARY No results for input(s): PHART, PCO2ART, PO2ART, HCO3, TCO2, O2SAT in the last 168 hours.  Invalid input(s): PCO2, PO2  CBC  Recent Labs Lab 02/11/14 1230  HGB 9.5*  HCT 28.9*  WBC 9.2  PLT 318    COAGULATION No results for input(s): INR in the last 168 hours.  CARDIAC  No results for input(s): TROPONINI in the last 168 hours. No results for input(s): PROBNP in the last 168 hours.   CHEMISTRY  Recent Labs Lab 02/11/14 1230  NA 138  K 3.8  CL 102  CO2 24  GLUCOSE 88  BUN 20  CREATININE 1.18  CALCIUM 8.7  MG 1.7   Estimated Creatinine Clearance: 93.4 mL/min (by C-G formula based on Cr of 1.18).   LIVER  Recent Labs Lab  02/11/14 1230  AST 147*  ALT 165*  ALKPHOS 93  BILITOT 0.7  PROT 5.7*  ALBUMIN 3.2*     INFECTIOUS No results for input(s): LATICACIDVEN, PROCALCITON in the last 168 hours.   ENDOCRINE CBG (last 3)  No results for input(s): GLUCAP in the last 72 hours.       IMAGING x48h Dg Chest 2 View  02/11/2014   CLINICAL DATA:  Shortness of breath and cough history of tricuspid valve replacement and aortic root replacement in November 2015; history of bacterial meningitis  EXAM: CHEST  2 VIEW  COMPARISON:  PA and lateral chest x-ray of January 21, 2014 and January 06, 2014.  FINDINGS: The lungs are hypoinflated. There are coarse interstitial markings bilaterally. The cardiac silhouette is enlarged. The central pulmonary vascularity is prominent. There is no pleural effusion. The permanent pacemaker is in reasonable position radiographically. The right-sided PICC line tip projects over the junction of the middle and distal thirds of the SVC. There are 7 intact sternal wires present.  IMPRESSION: There is bilateral pulmonary hypoinflation. The central pulmonary vascularity is mildly engorged consistent with CHF. This is not greatly changed from the December 10th study. Chronically increased density in the left retrocardiac region on the left is consistent with atelectasis.  Electronically Signed   By: David  [place]   On: 02/11/2014 08:44   Ct Angio Chest Pe W/cm &/or Wo Cm  02/11/2014   CLINICAL DATA:  Shortness breath for 6 days, worse on exertion. Recent open heart surgery.  EXAM: CT ANGIOGRAPHY CHEST WITH CONTRAST  TECHNIQUE: Multidetector CT imaging of the chest was performed using the standard protocol during bolus administration of intravenous contrast. Multiplanar CT image reconstructions and MIPs were obtained to evaluate the vascular anatomy.  CONTRAST:  OMNIPAQUE IOHEXOL 350 MG/ML SOLN  COMPARISON:  None.  FINDINGS: Image quality is degraded by respiratory motion. No definite  pulmonary embolus. No pathologically enlarged mediastinal, hilar or axillary lymph nodes. Heart is enlarged. No pericardial effusion.  Small bilateral pleural effusions. Again, image quality is degraded by respiratory motion. Mild collapse/consolidation in both lower lobes, right greater than left, with scattered volume loss in the right middle lobe and lingula. Airway is unremarkable.  Incidental imaging of the upper abdomen shows low attenuation throughout the visualized portion of the liver. There is reflux of contrast into the IVC and hepatic veins. Tiny stones are seen in the gallbladder. Visualized portions of the adrenal glands, kidneys, spleen, pancreas, stomach and bowel are grossly unremarkable.  No worrisome lytic or sclerotic lesions.  Review of the MIP images confirms the above findings.  IMPRESSION: 1. Image quality is rather degraded by respiratory motion. No definite pulmonary embolus. 2. Small bilateral effusions with collapse/consolidation in both lower lobes, right greater than left. Pneumonia is considered. 3. Hepatic steatosis. 4. Cholelithiasis.   Electronically Signed   By: Leanna Battles M.D.   On: 02/11/2014 16:31     A Acute resp distress  P No eICu intervention Monitor closely  Dr. Kalman Shan, M.D., [facility].C.P Pulmonary and Critical Care Medicine Staff Physician [facility] Pager: [phone], If no answer or between  15:00h - 7:00h: call [phone]  02/11/2014 9:25 PM

## 2014-02-11 NOTE — Progress Notes (Signed)
Patient is scheduled for TEE tomorrow 02/12/2014 at 8am

## 2014-02-11 NOTE — Progress Notes (Signed)
Pt transferred to 2S11

## 2014-02-11 NOTE — Progress Notes (Signed)
[address]       [name] [id number]             [phone]      Albert CousinHayden [name] [facility] Medical Record #[id number] Date of Birth: 03/22/1985  Referring: Lewayne Buntingrenshaw, Brian S, MD Primary Care: Joycelyn RuaMEYERS, STEPHEN, MD  Chief Complaint:   POST OP FOLLOW UP Past Surgical History  Procedure Laterality Date  . 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 Tricuspid valve replacement N/A 12/30/2013    Procedure: REPAIR OF TRICUSPID VALVE LEAFLET;  Surgeon: Delight OvensEdward B Linford Quintela, MD;  [facility];  Service: Open Heart Surgery;  Laterality: N/A;  . Intraoperative transesophageal echocardiogram N/A 12/30/2013    Procedure: INTRAOPERATIVE TRANSESOPHAGEAL ECHOCARDIOGRAM;  Surgeon: Delight OvensEdward B Caterin Tabares, MD;  [facility];  Service: Open Heart Surgery;  Laterality: N/A;  . 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 He was last seen 3 weeks ago at that time he felt well, denied shortness of breath or fever and chills. He notes over the last 4 days having increasing shortness of breath with little activity, denies pedal edema, denies orthopnea, denies fever or chills.     Past Medical History  Diagnosis Date  . Obesity   . Smokeless tobacco use   . Fracture of left femur   . Meningitis     Group B Strep (November 2015)  . Patent foramen ovale     Closed during procedure on December 30, 2013  . History of  open heart surgery     December 30, 2013- ascending aortic root replacement, TEE  . Endocarditis     related to meningitis   . Brain abscess      History  Smoking status  . Never Smoker   Smokeless tobacco  . Current User    History  Alcohol Use  . Yes     No Known Allergies  Current Outpatient Prescriptions  Medication Sig Dispense Refill  . aspirin EC 325 MG EC tablet Take 1 tablet (325 mg total) by mouth daily. 30 tablet 0  . cefTRIAXone (ROCEPHIN) 40 MG/ML IVPB Inject 50 mLs (2 g total) into the vein every 12 (twelve) hours. Last dose is to be given 02/10/2014. 50 mL 12   No current facility-administered medications for this visit.       Physical Exam: BP 116/82 mmHg  Pulse 127  Resp 20  Ht 5\' 5"  (1.651 m)  Wt 183 lb (83.008 kg)  BMI 30.45 kg/m2  SpO2 99%  General appearance: alert and cooperative Neurologic: intact Heart: Since last seen the character of her his murmur has significantly changed with diffuse holosystolic murmur throughout her throughout the precordium Lungs: clear to auscultation bilaterally Abdomen: soft, non-tender; bowel sounds normal; no masses,  no organomegaly Extremities: extremities normal, atraumatic, no cyanosis or edema and Homans sign is negative, no sign of DVT Wound: Sternum is stable and well-healed  Diagnostic Studies & Laboratory data:     Recent Radiology Findings:   Dg Chest 2 View  02/11/2014   CLINICAL DATA:  Shortness of breath and cough history of tricuspid valve replacement and aortic root replacement in November 2015; history of bacterial meningitis  EXAM: CHEST  2 VIEW  COMPARISON:  PA and lateral chest x-ray of January 21, 2014 and January 06, 2014.  FINDINGS: The lungs are hypoinflated. There are coarse interstitial markings bilaterally. The cardiac silhouette is enlarged. The central pulmonary vascularity is prominent. There is no pleural effusion. The permanent pacemaker is in reasonable position  radiographically. The right-sided PICC line tip projects over the junction of the middle and distal thirds of the SVC. There are 7 intact sternal wires present.  IMPRESSION: There is bilateral pulmonary hypoinflation. The central pulmonary vascularity is mildly engorged consistent with CHF. This is not greatly changed from the December 10th study. Chronically increased density in the left retrocardiac region on the left is consistent with atelectasis.   Electronically Signed   By: David  [place]   On: 02/11/2014 08:44   Ct Head W Wo Contrast  02/09/2014   CLINICAL DATA:  Bacterial meningitis with multiple brain abscesses.  EXAM: CT HEAD WITHOUT AND WITH CONTRAST  TECHNIQUE: Contiguous axial images were obtained from the base of the skull through the vertex without and with intravenous contrast  CONTRAST:  28mL OMNIPAQUE IOHEXOL 300 MG/ML  SOLN  COMPARISON:  MRI brain 12/24/2013.  FINDINGS: No acute cortical infarct, hemorrhage, or mass lesion is present. The ventricles are of normal size. No significant extra-axial fluid collection is present.  The postcontrast images demonstrate no pathologic enhancement. Of note, the previously described microabscesses are likely below the resolution of CT, even with contrast.  The paranasal sinuses and mastoid air cells are clear. The osseous skull is intact.  IMPRESSION: 1. Normal CT appearance of the brain without and with contrast. 2. Of note, CT without and with contrast is less sensitive for the evaluation of persistent microabscess than MRI.   Electronically Signed   By: Gennette Pac M.D.   On: 02/09/2014 12:24      Recent Lab Findings: Lab Results  Component Value Date   WBC 8.1 01/06/2014   HGB 9.5* 01/06/2014   HCT 29.1* 01/06/2014   PLT 314 01/06/2014   GLUCOSE 100* 01/06/2014   TRIG 119 12/29/2013   ALT 199* 01/03/2014   AST 97* 01/03/2014   NA 134* 01/06/2014   K 4.7 01/06/2014   CL 94* 01/06/2014   CREATININE 0.93 01/06/2014   BUN 8 01/06/2014    CO2 29 01/06/2014   INR 1.42 01/02/2014      Assessment / Plan:    With new and increasing symptoms of congestive heart failure and change in character heart sounds I discussed with cardiology admitting the patient today for further workup Consider CT scan/to rule out PE Echocardiogram-possible repeat the TEE Repeat cultures  Delight Ovens MD      [address] [place] [place] [address] Office [phone]   Beeper [phone]  02/11/2014 10:26 AM

## 2014-02-11 NOTE — Progress Notes (Signed)
[name] notified that pt anxious and requesting to speak to MD. This RN notified that Dr. Shirlee Latch to review ECHO and speak to patient. Pt with order to transfer to ICU for closer monitoring. Report called to RN on 2S to transfer pt to 2S11.

## 2014-02-11 NOTE — Care Management Note (Signed)
    Page 1 of 2   02/18/2014     11:39:10 AM CARE MANAGEMENT NOTE 02/18/2014  Patient:  [name], [name]   Account Number:  [id number]  Date Initiated:  02/11/2014  Documentation initiated by:  Donato Schultz  Subjective/Objective Assessment:   SOB     Action/Plan:   CM to follow for disposition needs   Anticipated DC Date:  02/17/2014   Anticipated DC Plan:  HOME W HOME HEALTH SERVICES         [facility] Choice  HOME HEALTH   Choice offered to / List presented to:  NA      DME agency  [facility]     [facility] arranged  HH-1 RN  IV Antibiotics      HH agency  [facility]   Status of service:  Completed, signed off Medicare Important Message given?  NO (If response is "NO", the following Medicare IM given date fields will be blank) Date Medicare IM given:   Medicare IM given by:   Date Additional Medicare IM given:   Additional Medicare IM given by:    Discharge Disposition:  HOME W HOME HEALTH SERVICES  Per UR Regulation:  Reviewed for med. necessity/level of care/duration of stay  If discussed at Long Length of Stay Meetings, dates discussed:    Comments:  02/18/14- 1030- Donn Pierini RN, BSN [phone] Notified Pam with [facility] of pt's discharge for today.  02/16/14- 1500- Donn Pierini RN, BSN [phone] Referral for home IV abx- pt has used [facility] in past and would like to use them again- referral called to [facility] with [facility] for IV abx- Arrange home health to complete 6 weeks of postoperative IV rocephin2 g q 12 and two weeks total postoperative gentamicin. While he is on the gentamicin he will  needs biweekly bmp w gfr and cbc with differential and gentamicin  faxed to my office at [phone]. Home infusion, and he needs to adjust gentamicin dosing. Once he is gotten through the 2 weeks of gentamicin we can change lab monitoring to weekly CBC with differential and weekly basic metabolic panel with GFR   Crystal Hutchinson RN, BSN, MSHL, CCM  Nurse - Case Manager,  (Unit  [facility])  [phone]   02/11/2014 Social:  From home [facility] is providing the following services: HHRN and Home Infusion Pharmacy for home IV ABX.

## 2014-02-11 NOTE — Progress Notes (Signed)
[facility]  Patient Status: ACTIVE PATIENT WITH [facility] PRIOR TO THIS READMISSION  [facility] is providing the following services: HHRN and Home Infusion Pharmacy for home IV ABX. [facility] hospital team will follow pt while in hospital and support transition home when deemed appropriate by MD.  If patient discharges after hours, please call [phone].   Sedalia Muta 02/11/2014, 1:33 PM

## 2014-02-11 NOTE — Progress Notes (Signed)
Pt notified of possible PNA on CXR, abx ordered and blood cultures.

## 2014-02-11 NOTE — Progress Notes (Addendum)
2D echo reviewed.  There is an abnormality in the RVOT with wide open flow by colorflow doppler that may represent recurrent aortic/atrial fistula vs. Severe TR.  Difficult images.  There is definitely an abnormal density in the the area of the RA/aorta ? Whether this is where the repair was done.  He has severe pulmonary HTN with PASP at and a very loud murmur which is new on exam in the aortic position.  He remains tachycardic with soft BP.  I will transfer him to the CCU.  I have discussed the case with Dr. Cornelius Moras who will review the echo in the am.  I will make him NPO for TEE in the am.  Blood cultures have been ordered and he is continuing on Rocephin.  Chest xray shows engorged pulmonary vasculature and increased BNP.  Will start IV Lasix but gentle diuresis due to soft BP.  D-Dimer elevated but chest CT negative for PE.

## 2014-02-11 NOTE — Progress Notes (Signed)
Pt states that he urinated a small amt upon arrival, and has not urinated since.  I educated pt on the importance of using the urinal so that staff can measure.  He stated that he understood.

## 2014-02-11 NOTE — Progress Notes (Signed)
  Echocardiogram 2D Echocardiogram has been performed.  [name] 02/11/2014, 2:03 PM

## 2014-02-12 ENCOUNTER — Encounter (HOSPITAL_COMMUNITY)
Admission: AD | Disposition: A | Discharge status: Home Health | Payer: Self-pay | Source: Ambulatory Visit | Attending: Thoracic Surgery (Cardiothoracic Vascular Surgery)

## 2014-02-12 ENCOUNTER — Inpatient Hospital Stay (HOSPITAL_COMMUNITY): Payer: BC Managed Care – PPO

## 2014-02-12 ENCOUNTER — Observation Stay (HOSPITAL_COMMUNITY): Payer: BC Managed Care – PPO | Admitting: Certified Registered"

## 2014-02-12 ENCOUNTER — Ambulatory Visit (HOSPITAL_COMMUNITY): Admit: 2014-02-12 | Payer: Self-pay | Admitting: Cardiology

## 2014-02-12 ENCOUNTER — Encounter (HOSPITAL_COMMUNITY)
Admission: AD | Disposition: A | Discharge status: Home Health | Payer: BC Managed Care – PPO | Source: Ambulatory Visit | Attending: Thoracic Surgery (Cardiothoracic Vascular Surgery)

## 2014-02-12 ENCOUNTER — Encounter (HOSPITAL_COMMUNITY): Payer: Self-pay | Admitting: Thoracic Surgery (Cardiothoracic Vascular Surgery)

## 2014-02-12 DIAGNOSIS — Z8774 Personal history of (corrected) congenital malformations of heart and circulatory system: Secondary | ICD-10-CM | POA: Diagnosis not present

## 2014-02-12 DIAGNOSIS — I38 Endocarditis, valve unspecified: Secondary | ICD-10-CM | POA: Diagnosis not present

## 2014-02-12 DIAGNOSIS — D689 Coagulation defect, unspecified: Secondary | ICD-10-CM | POA: Diagnosis present

## 2014-02-12 DIAGNOSIS — Z8661 Personal history of infections of the central nervous system: Secondary | ICD-10-CM | POA: Diagnosis not present

## 2014-02-12 DIAGNOSIS — I361 Nonrheumatic tricuspid (valve) insufficiency: Secondary | ICD-10-CM | POA: Diagnosis not present

## 2014-02-12 DIAGNOSIS — T826XXA Infection and inflammatory reaction due to cardiac valve prosthesis, initial encounter: Secondary | ICD-10-CM | POA: Diagnosis present

## 2014-02-12 DIAGNOSIS — R0602 Shortness of breath: Secondary | ICD-10-CM | POA: Diagnosis present

## 2014-02-12 DIAGNOSIS — R000 Tachycardia, unspecified: Secondary | ICD-10-CM | POA: Diagnosis present

## 2014-02-12 DIAGNOSIS — G610 Guillain-Barre syndrome: Secondary | ICD-10-CM | POA: Diagnosis not present

## 2014-02-12 DIAGNOSIS — R7989 Other specified abnormal findings of blood chemistry: Secondary | ICD-10-CM | POA: Diagnosis not present

## 2014-02-12 DIAGNOSIS — I351 Nonrheumatic aortic (valve) insufficiency: Secondary | ICD-10-CM | POA: Diagnosis present

## 2014-02-12 DIAGNOSIS — Z6829 Body mass index (BMI) 29.0-29.9, adult: Secondary | ICD-10-CM | POA: Diagnosis not present

## 2014-02-12 DIAGNOSIS — Q211 Atrial septal defect: Secondary | ICD-10-CM

## 2014-02-12 DIAGNOSIS — R7881 Bacteremia: Secondary | ICD-10-CM | POA: Diagnosis not present

## 2014-02-12 DIAGNOSIS — J9621 Acute and chronic respiratory failure with hypoxia: Secondary | ICD-10-CM | POA: Diagnosis present

## 2014-02-12 DIAGNOSIS — I272 Other secondary pulmonary hypertension: Secondary | ICD-10-CM | POA: Diagnosis present

## 2014-02-12 DIAGNOSIS — Q210 Ventricular septal defect: Secondary | ICD-10-CM

## 2014-02-12 DIAGNOSIS — I509 Heart failure, unspecified: Secondary | ICD-10-CM | POA: Diagnosis not present

## 2014-02-12 DIAGNOSIS — E669 Obesity, unspecified: Secondary | ICD-10-CM | POA: Diagnosis present

## 2014-02-12 DIAGNOSIS — Z87891 Personal history of nicotine dependence: Secondary | ICD-10-CM | POA: Diagnosis not present

## 2014-02-12 DIAGNOSIS — Z792 Long term (current) use of antibiotics: Secondary | ICD-10-CM | POA: Diagnosis not present

## 2014-02-12 DIAGNOSIS — D62 Acute posthemorrhagic anemia: Secondary | ICD-10-CM | POA: Diagnosis present

## 2014-02-12 DIAGNOSIS — I340 Nonrheumatic mitral (valve) insufficiency: Secondary | ICD-10-CM | POA: Diagnosis present

## 2014-02-12 DIAGNOSIS — Z7982 Long term (current) use of aspirin: Secondary | ICD-10-CM | POA: Diagnosis not present

## 2014-02-12 DIAGNOSIS — I330 Acute and subacute infective endocarditis: Secondary | ICD-10-CM | POA: Diagnosis present

## 2014-02-12 DIAGNOSIS — I5031 Acute diastolic (congestive) heart failure: Secondary | ICD-10-CM | POA: Diagnosis present

## 2014-02-12 DIAGNOSIS — I071 Rheumatic tricuspid insufficiency: Secondary | ICD-10-CM | POA: Diagnosis present

## 2014-02-12 DIAGNOSIS — R0600 Dyspnea, unspecified: Secondary | ICD-10-CM | POA: Diagnosis present

## 2014-02-12 DIAGNOSIS — R011 Cardiac murmur, unspecified: Secondary | ICD-10-CM | POA: Diagnosis present

## 2014-02-12 DIAGNOSIS — Z862 Personal history of diseases of the blood and blood-forming organs and certain disorders involving the immune mechanism: Secondary | ICD-10-CM | POA: Diagnosis not present

## 2014-02-12 DIAGNOSIS — D696 Thrombocytopenia, unspecified: Secondary | ICD-10-CM | POA: Diagnosis present

## 2014-02-12 DIAGNOSIS — Z9889 Other specified postprocedural states: Secondary | ICD-10-CM

## 2014-02-12 DIAGNOSIS — B951 Streptococcus, group B, as the cause of diseases classified elsewhere: Secondary | ICD-10-CM | POA: Diagnosis not present

## 2014-02-12 DIAGNOSIS — I349 Nonrheumatic mitral valve disorder, unspecified: Secondary | ICD-10-CM

## 2014-02-12 HISTORY — PX: TEE WITHOUT CARDIOVERSION: SHX5443

## 2014-02-12 HISTORY — DX: Other specified postprocedural states: Z98.890

## 2014-02-12 HISTORY — DX: Ventricular septal defect: Q21.0

## 2014-02-12 HISTORY — PX: RIGHT HEART CATHETERIZATION: SHX5447

## 2014-02-12 HISTORY — DX: Personal history of (corrected) congenital malformations of heart and circulatory system: Z87.74

## 2014-02-12 HISTORY — PX: TRICUSPID VALVE REPLACEMENT: SHX816

## 2014-02-12 LAB — BASIC METABOLIC PANEL
Anion gap: 10 (ref 5–15)
BUN: 21 mg/dL (ref 6–23)
CHLORIDE: 104 meq/L (ref 96–112)
CO2: 23 mmol/L (ref 19–32)
Calcium: 8.5 mg/dL (ref 8.4–10.5)
Creatinine, Ser: 1.14 mg/dL (ref 0.50–1.35)
GFR calc non Af Amer: 88 mL/min — ABNORMAL LOW (ref >90)
GLUCOSE: 96 mg/dL (ref 70–99)
Potassium: 3.6 mmol/L (ref 3.5–5.1)
Sodium: 137 mmol/L (ref 135–145)

## 2014-02-12 LAB — POCT I-STAT 3, ART BLOOD GAS (G3+)
ACID-BASE DEFICIT: 6 mmol/L — AB (ref 0.0–2.0)
Acid-base deficit: 3 mmol/L — ABNORMAL HIGH (ref 0.0–2.0)
Acid-base deficit: 1 mmol/L (ref 0.0–2.0)
BICARBONATE: 22.6 meq/L (ref 20.0–24.0)
BICARBONATE: 19.9 meq/L — AB (ref 20.0–24.0)
BICARBONATE: 26.7 meq/L — AB (ref 20.0–24.0)
BICARBONATE: 25.6 meq/L — AB (ref 20.0–24.0)
Bicarbonate: 23.8 mEq/L (ref 20.0–24.0)
O2 SAT: 98 %
O2 Saturation: 100 %
O2 Saturation: 100 %
O2 Saturation: 87 %
O2 Saturation: 93 %
PO2 ART: 310 mmHg — AB (ref 80.0–100.0)
PO2 ART: 274 mmHg — AB (ref 80.0–100.0)
Patient temperature: 95.2
TCO2: 25 mmol/L (ref 0–100)
TCO2: 24 mmol/L (ref 0–100)
TCO2: 21 mmol/L (ref 0–100)
TCO2: 28 mmol/L (ref 0–100)
TCO2: 27 mmol/L (ref 0–100)
pCO2 arterial: 35.3 mmHg (ref 35.0–45.0)
pCO2 arterial: 40.1 mmHg (ref 35.0–45.0)
pCO2 arterial: 43 mmHg (ref 35.0–45.0)
pCO2 arterial: 51.8 mmHg — ABNORMAL HIGH (ref 35.0–45.0)
pCO2 arterial: 45.6 mmHg — ABNORMAL HIGH (ref 35.0–45.0)
pH, Arterial: 7.437 (ref 7.350–7.450)
pH, Arterial: 7.359 (ref 7.350–7.450)
pH, Arterial: 7.275 — ABNORMAL LOW (ref 7.350–7.450)
pH, Arterial: 7.311 — ABNORMAL LOW (ref 7.350–7.450)
pH, Arterial: 7.351 (ref 7.350–7.450)
pO2, Arterial: 102 mmHg — ABNORMAL HIGH (ref 80.0–100.0)
pO2, Arterial: 54 mmHg — ABNORMAL LOW (ref 80.0–100.0)
pO2, Arterial: 67 mmHg — ABNORMAL LOW (ref 80.0–100.0)

## 2014-02-12 LAB — POCT I-STAT 3, VENOUS BLOOD GAS (G3P V)
ACID-BASE DEFICIT: 3 mmol/L — AB (ref 0.0–2.0)
ACID-BASE DEFICIT: 3 mmol/L — AB (ref 0.0–2.0)
ACID-BASE DEFICIT: 3 mmol/L — AB (ref 0.0–2.0)
Acid-base deficit: 2 mmol/L (ref 0.0–2.0)
Acid-base deficit: 3 mmol/L — ABNORMAL HIGH (ref 0.0–2.0)
BICARBONATE: 20.8 meq/L (ref 20.0–24.0)
BICARBONATE: 21.5 meq/L (ref 20.0–24.0)
Bicarbonate: 21 mEq/L (ref 20.0–24.0)
Bicarbonate: 21.4 mEq/L (ref 20.0–24.0)
Bicarbonate: 22 mEq/L (ref 20.0–24.0)
O2 SAT: 74 %
O2 SAT: 86 %
O2 Saturation: 84 %
O2 Saturation: 88 %
O2 Saturation: 90 %
PCO2 VEN: 31.6 mmHg — AB (ref 45.0–50.0)
PH VEN: 7.413 — AB (ref 7.250–7.300)
PH VEN: 7.415 — AB (ref 7.250–7.300)
PH VEN: 7.421 — AB (ref 7.250–7.300)
TCO2: 22 mmol/L (ref 0–100)
TCO2: 22 mmol/L (ref 0–100)
TCO2: 22 mmol/L (ref 0–100)
TCO2: 22 mmol/L (ref 0–100)
TCO2: 23 mmol/L (ref 0–100)
pCO2, Ven: 32.8 mmHg — ABNORMAL LOW (ref 45.0–50.0)
pCO2, Ven: 33.5 mmHg — ABNORMAL LOW (ref 45.0–50.0)
pCO2, Ven: 33.7 mmHg — ABNORMAL LOW (ref 45.0–50.0)
pCO2, Ven: 33.8 mmHg — ABNORMAL LOW (ref 45.0–50.0)
pH, Ven: 7.412 — ABNORMAL HIGH (ref 7.250–7.300)
pH, Ven: 7.426 — ABNORMAL HIGH (ref 7.250–7.300)
pO2, Ven: 38 mmHg (ref 30.0–45.0)
pO2, Ven: 47 mmHg — ABNORMAL HIGH (ref 30.0–45.0)
pO2, Ven: 49 mmHg — ABNORMAL HIGH (ref 30.0–45.0)
pO2, Ven: 53 mmHg — ABNORMAL HIGH (ref 30.0–45.0)
pO2, Ven: 56 mmHg — ABNORMAL HIGH (ref 30.0–45.0)

## 2014-02-12 LAB — CBC
HCT: 29.1 % — ABNORMAL LOW (ref 39.0–52.0)
HCT: 24.4 % — ABNORMAL LOW (ref 39.0–52.0)
HEMATOCRIT: 29.7 % — AB (ref 39.0–52.0)
HEMOGLOBIN: 7.9 g/dL — AB (ref 13.0–17.0)
HEMOGLOBIN: 9.9 g/dL — AB (ref 13.0–17.0)
Hemoglobin: 9.2 g/dL — ABNORMAL LOW (ref 13.0–17.0)
MCH: 25.3 pg — AB (ref 26.0–34.0)
MCH: 26.9 pg (ref 26.0–34.0)
MCH: 27.8 pg (ref 26.0–34.0)
MCHC: 31.6 g/dL (ref 30.0–36.0)
MCHC: 32.4 g/dL (ref 30.0–36.0)
MCHC: 33.3 g/dL (ref 30.0–36.0)
MCV: 79.9 fL (ref 78.0–100.0)
MCV: 83 fL (ref 78.0–100.0)
MCV: 83.4 fL (ref 78.0–100.0)
PLATELETS: 349 10*3/uL (ref 150–400)
PLATELETS: 114 10*3/uL — AB (ref 150–400)
Platelets: 103 10*3/uL — ABNORMAL LOW (ref 150–400)
RBC: 3.64 MIL/uL — AB (ref 4.22–5.81)
RBC: 2.94 MIL/uL — ABNORMAL LOW (ref 4.22–5.81)
RBC: 3.56 MIL/uL — AB (ref 4.22–5.81)
RDW: 16.9 % — ABNORMAL HIGH (ref 11.5–15.5)
RDW: 15.9 % — ABNORMAL HIGH (ref 11.5–15.5)
RDW: 15.6 % — AB (ref 11.5–15.5)
WBC: 9.3 10*3/uL (ref 4.0–10.5)
WBC: 24.8 10*3/uL — AB (ref 4.0–10.5)
WBC: 17.1 10*3/uL — ABNORMAL HIGH (ref 4.0–10.5)

## 2014-02-12 LAB — POCT I-STAT, CHEM 8
BUN: 22 mg/dL (ref 6–23)
BUN: 20 mg/dL (ref 6–23)
BUN: 20 mg/dL (ref 6–23)
BUN: 19 mg/dL (ref 6–23)
BUN: 19 mg/dL (ref 6–23)
CALCIUM ION: 1.11 mmol/L — AB (ref 1.12–1.23)
CALCIUM ION: 0.99 mmol/L — AB (ref 1.12–1.23)
CALCIUM ION: 0.97 mmol/L — AB (ref 1.12–1.23)
CHLORIDE: 97 meq/L (ref 96–112)
CREATININE: 0.9 mg/dL (ref 0.50–1.35)
Calcium, Ion: 1.13 mmol/L (ref 1.12–1.23)
Calcium, Ion: 0.78 mmol/L — ABNORMAL LOW (ref 1.12–1.23)
Chloride: 105 mEq/L (ref 96–112)
Chloride: 105 mEq/L (ref 96–112)
Chloride: 101 mEq/L (ref 96–112)
Chloride: 103 mEq/L (ref 96–112)
Creatinine, Ser: 1 mg/dL (ref 0.50–1.35)
Creatinine, Ser: 0.9 mg/dL (ref 0.50–1.35)
Creatinine, Ser: 1 mg/dL (ref 0.50–1.35)
Creatinine, Ser: 0.9 mg/dL (ref 0.50–1.35)
GLUCOSE: 108 mg/dL — AB (ref 70–99)
GLUCOSE: 113 mg/dL — AB (ref 70–99)
Glucose, Bld: 103 mg/dL — ABNORMAL HIGH (ref 70–99)
Glucose, Bld: 104 mg/dL — ABNORMAL HIGH (ref 70–99)
Glucose, Bld: 116 mg/dL — ABNORMAL HIGH (ref 70–99)
HCT: 27 % — ABNORMAL LOW (ref 39.0–52.0)
HEMATOCRIT: 29 % — AB (ref 39.0–52.0)
HEMATOCRIT: 20 % — AB (ref 39.0–52.0)
HEMATOCRIT: 20 % — AB (ref 39.0–52.0)
HEMATOCRIT: 25 % — AB (ref 39.0–52.0)
HEMOGLOBIN: 6.8 g/dL — AB (ref 13.0–17.0)
Hemoglobin: 9.9 g/dL — ABNORMAL LOW (ref 13.0–17.0)
Hemoglobin: 9.2 g/dL — ABNORMAL LOW (ref 13.0–17.0)
Hemoglobin: 6.8 g/dL — CL (ref 13.0–17.0)
Hemoglobin: 8.5 g/dL — ABNORMAL LOW (ref 13.0–17.0)
POTASSIUM: 3.9 mmol/L (ref 3.5–5.1)
POTASSIUM: 3.7 mmol/L (ref 3.5–5.1)
POTASSIUM: 4.2 mmol/L (ref 3.5–5.1)
Potassium: 3.6 mmol/L (ref 3.5–5.1)
Potassium: 4.6 mmol/L (ref 3.5–5.1)
SODIUM: 139 mmol/L (ref 135–145)
SODIUM: 137 mmol/L (ref 135–145)
SODIUM: 138 mmol/L (ref 135–145)
Sodium: 133 mmol/L — ABNORMAL LOW (ref 135–145)
Sodium: 140 mmol/L (ref 135–145)
TCO2: 20 mmol/L (ref 0–100)
TCO2: 20 mmol/L (ref 0–100)
TCO2: 20 mmol/L (ref 0–100)
TCO2: 19 mmol/L (ref 0–100)
TCO2: 19 mmol/L (ref 0–100)

## 2014-02-12 LAB — PROTIME-INR
INR: 1.71 — AB (ref 0.00–1.49)
INR: 2.61 — ABNORMAL HIGH (ref 0.00–1.49)
INR: 1.6 — ABNORMAL HIGH (ref 0.00–1.49)
Prothrombin Time: 20.2 seconds — ABNORMAL HIGH (ref 11.6–15.2)
Prothrombin Time: 28.2 seconds — ABNORMAL HIGH (ref 11.6–15.2)
Prothrombin Time: 19.2 seconds — ABNORMAL HIGH (ref 11.6–15.2)

## 2014-02-12 LAB — HEMOGLOBIN AND HEMATOCRIT, BLOOD
HCT: 17.8 % — ABNORMAL LOW (ref 39.0–52.0)
HEMOGLOBIN: 5.8 g/dL — AB (ref 13.0–17.0)

## 2014-02-12 LAB — POCT I-STAT 4, (NA,K, GLUC, HGB,HCT)
Glucose, Bld: 104 mg/dL — ABNORMAL HIGH (ref 70–99)
HEMATOCRIT: 28 % — AB (ref 39.0–52.0)
HEMOGLOBIN: 9.5 g/dL — AB (ref 13.0–17.0)
Potassium: 3.7 mmol/L (ref 3.5–5.1)
Sodium: 143 mmol/L (ref 135–145)

## 2014-02-12 LAB — BLOOD GAS, ARTERIAL
ACID-BASE DEFICIT: 0.5 mmol/L (ref 0.0–2.0)
Bicarbonate: 22.3 mEq/L (ref 20.0–24.0)
Drawn by: 31101
FIO2: 0.21 %
O2 Saturation: 97.9 %
PATIENT TEMPERATURE: 98.6
PH ART: 7.502 — AB (ref 7.350–7.450)
TCO2: 23.2 mmol/L (ref 0–100)
pCO2 arterial: 28.7 mmHg — ABNORMAL LOW (ref 35.0–45.0)
pO2, Arterial: 101 mmHg — ABNORMAL HIGH (ref 80.0–100.0)

## 2014-02-12 LAB — GRAM STAIN

## 2014-02-12 LAB — POCT I-STAT GLUCOSE
Glucose, Bld: 162 mg/dL — ABNORMAL HIGH (ref 70–99)
Glucose, Bld: 130 mg/dL — ABNORMAL HIGH (ref 70–99)
OPERATOR ID: 3406
Operator id: 3406

## 2014-02-12 LAB — FIBRINOGEN
FIBRINOGEN: 247 mg/dL (ref 204–475)
FIBRINOGEN: 172 mg/dL — AB (ref 204–475)

## 2014-02-12 LAB — PLATELET COUNT: PLATELETS: 110 10*3/uL — AB (ref 150–400)

## 2014-02-12 LAB — APTT
aPTT: 35 seconds (ref 24–37)
aPTT: 51 seconds — ABNORMAL HIGH (ref 24–37)
aPTT: 38 seconds — ABNORMAL HIGH (ref 24–37)

## 2014-02-12 LAB — PREPARE RBC (CROSSMATCH)

## 2014-02-12 SURGERY — TRICUSPID VALVE REPAIR
Anesthesia: General | Site: Chest

## 2014-02-12 SURGERY — RIGHT HEART CATH
Anesthesia: LOCAL

## 2014-02-12 SURGERY — ECHOCARDIOGRAM, TRANSESOPHAGEAL
Anesthesia: Moderate Sedation

## 2014-02-12 MED ORDER — MIDAZOLAM HCL 5 MG/ML IJ SOLN
INTRAMUSCULAR | Status: AC
  Filled 2014-02-12: qty 2

## 2014-02-12 MED ORDER — MILRINONE IN DEXTROSE 20 MG/100ML IV SOLN
0.3750 ug/kg/min | INTRAVENOUS | Status: DC
  Administered 2014-02-12: 0.375 ug/kg/min via INTRAVENOUS
  Filled 2014-02-12: qty 100

## 2014-02-12 MED ORDER — SODIUM CHLORIDE 0.9 % IV SOLN: Freq: Once | INTRAVENOUS | Status: DC

## 2014-02-12 MED ORDER — PROPOFOL 10 MG/ML IV BOLUS
INTRAVENOUS | Status: DC | PRN
  Administered 2014-02-12: 160 mg via INTRAVENOUS

## 2014-02-12 MED ORDER — ASPIRIN EC 325 MG PO TBEC
325.0000 mg | DELAYED_RELEASE_TABLET | Freq: Every day | ORAL | Status: DC
  Administered 2014-02-14 – 2014-02-17 (×4): 325 mg via ORAL
  Filled 2014-02-12 (×6): qty 1

## 2014-02-12 MED ORDER — SODIUM CHLORIDE 0.45 % IV SOLN
INTRAVENOUS | Status: DC
  Administered 2014-02-12: 20 mL/h via INTRAVENOUS

## 2014-02-12 MED ORDER — DOPAMINE-DEXTROSE 3.2-5 MG/ML-% IV SOLN
INTRAVENOUS | Status: DC | PRN
  Administered 2014-02-12: 3 ug/kg/min via INTRAVENOUS

## 2014-02-12 MED ORDER — SODIUM CHLORIDE 0.9 % IV SOLN: INTRAVENOUS | Status: DC

## 2014-02-12 MED ORDER — DEXMEDETOMIDINE HCL IN NACL 200 MCG/50ML IV SOLN
0.0000 ug/kg/h | INTRAVENOUS | Status: DC
  Administered 2014-02-13 (×5): 0.7 ug/kg/h via INTRAVENOUS
  Filled 2014-02-12 (×5): qty 50

## 2014-02-12 MED ORDER — ACETAMINOPHEN 160 MG/5ML PO SOLN
1000.0000 mg | Freq: Four times a day (QID) | ORAL | Status: DC
  Administered 2014-02-13 (×2): 1000 mg
  Filled 2014-02-12 (×2): qty 40.6

## 2014-02-12 MED ORDER — ROCURONIUM BROMIDE 50 MG/5ML IV SOLN
INTRAVENOUS | Status: AC
  Filled 2014-02-12: qty 1

## 2014-02-12 MED ORDER — NITROGLYCERIN IN D5W 200-5 MCG/ML-% IV SOLN: 0.0000 ug/min | INTRAVENOUS | Status: DC

## 2014-02-12 MED ORDER — LIDOCAINE VISCOUS 2 % MT SOLN
OROMUCOSAL | Status: DC | PRN
  Administered 2014-02-12: 20 mL via OROMUCOSAL

## 2014-02-12 MED ORDER — LIDOCAINE VISCOUS 2 % MT SOLN
OROMUCOSAL | Status: AC
  Filled 2014-02-12: qty 15

## 2014-02-12 MED ORDER — MAGNESIUM SULFATE 4 GM/100ML IV SOLN
4.0000 g | Freq: Once | INTRAVENOUS | Status: AC
  Administered 2014-02-12: 4 g via INTRAVENOUS
  Filled 2014-02-12: qty 100

## 2014-02-12 MED ORDER — SODIUM CHLORIDE 0.9 % IV SOLN
INTRAVENOUS | Status: DC
  Administered 2014-02-12: 1.1 [IU]/h via INTRAVENOUS
  Filled 2014-02-12: qty 2.5

## 2014-02-12 MED ORDER — ONDANSETRON HCL 4 MG/2ML IJ SOLN: 4.0000 mg | Freq: Four times a day (QID) | INTRAMUSCULAR | Status: DC | PRN

## 2014-02-12 MED ORDER — INSULIN REGULAR HUMAN 100 UNIT/ML IJ SOLN
INTRAMUSCULAR | Status: DC
  Administered 2014-02-12: 1.8 [IU]/h via INTRAVENOUS
  Filled 2014-02-12: qty 2.5

## 2014-02-12 MED ORDER — PANTOPRAZOLE SODIUM 40 MG PO TBEC
40.0000 mg | DELAYED_RELEASE_TABLET | Freq: Every day | ORAL | Status: DC
  Administered 2014-02-14 – 2014-02-17 (×4): 40 mg via ORAL
  Filled 2014-02-12 (×3): qty 1

## 2014-02-12 MED ORDER — ROCURONIUM BROMIDE 100 MG/10ML IV SOLN
INTRAVENOUS | Status: DC | PRN
  Administered 2014-02-12 (×6): 50 mg via INTRAVENOUS

## 2014-02-12 MED ORDER — BUTAMBEN-TETRACAINE-BENZOCAINE 2-2-14 % EX AERO
INHALATION_SPRAY | CUTANEOUS | Status: DC | PRN
  Administered 2014-02-12: 2 via TOPICAL

## 2014-02-12 MED ORDER — MILRINONE IN DEXTROSE 20 MG/100ML IV SOLN
INTRAVENOUS | Status: AC
  Filled 2014-02-12: qty 100

## 2014-02-12 MED ORDER — DEXTROSE 5 % IV SOLN
0.0000 ug/min | INTRAVENOUS | Status: DC
  Administered 2014-02-13: 5 ug/min via INTRAVENOUS
  Administered 2014-02-13: 15 ug/min via INTRAVENOUS
  Filled 2014-02-12 (×2): qty 2

## 2014-02-12 MED ORDER — MIDAZOLAM HCL 2 MG/2ML IJ SOLN
INTRAMUSCULAR | Status: AC
  Filled 2014-02-12: qty 2

## 2014-02-12 MED ORDER — SUCCINYLCHOLINE CHLORIDE 20 MG/ML IJ SOLN
INTRAMUSCULAR | Status: AC
  Filled 2014-02-12: qty 1

## 2014-02-12 MED ORDER — DEXMEDETOMIDINE HCL IN NACL 400 MCG/100ML IV SOLN
0.1000 ug/kg/h | INTRAVENOUS | Status: DC
  Administered 2014-02-12: 56.77 ug/h via INTRAVENOUS
  Filled 2014-02-12 (×3): qty 100

## 2014-02-12 MED ORDER — ACETAMINOPHEN 160 MG/5ML PO SOLN: 650.0000 mg | Freq: Once | ORAL | Status: AC

## 2014-02-12 MED ORDER — SODIUM CHLORIDE 0.9 % IJ SOLN: 3.0000 mL | INTRAMUSCULAR | Status: DC | PRN

## 2014-02-12 MED ORDER — VANCOMYCIN HCL 1000 MG IV SOLR
1000.0000 mg | INTRAVENOUS | Status: DC | PRN
  Administered 2014-02-12: 1250 mg via INTRAVENOUS

## 2014-02-12 MED ORDER — SODIUM CHLORIDE 0.9 % IR SOLN
Status: DC | PRN
  Administered 2014-02-12: 500 mL

## 2014-02-12 MED ORDER — HEPARIN SODIUM (PORCINE) 1000 UNIT/ML IJ SOLN
INTRAMUSCULAR | Status: DC | PRN
  Administered 2014-02-12: 40 mL via INTRAVENOUS

## 2014-02-12 MED ORDER — INSULIN REGULAR HUMAN 100 UNIT/ML IJ SOLN
250.0000 [IU] | INTRAMUSCULAR | Status: DC | PRN
  Administered 2014-02-12: 1.4 [IU]/h via INTRAVENOUS

## 2014-02-12 MED ORDER — PROPOFOL 10 MG/ML IV BOLUS
INTRAVENOUS | Status: AC
  Filled 2014-02-12: qty 20

## 2014-02-12 MED ORDER — ALBUMIN HUMAN 5 % IV SOLN
INTRAVENOUS | Status: DC | PRN
  Administered 2014-02-12: 19:00:00 via INTRAVENOUS

## 2014-02-12 MED ORDER — OXYCODONE HCL 5 MG PO TABS
5.0000 mg | ORAL_TABLET | ORAL | Status: DC | PRN
  Administered 2014-02-14 – 2014-02-15 (×7): 10 mg via ORAL
  Filled 2014-02-12 (×7): qty 2

## 2014-02-12 MED ORDER — LIDOCAINE HCL (CARDIAC) 20 MG/ML IV SOLN
INTRAVENOUS | Status: DC | PRN
  Administered 2014-02-12: 50 mg via INTRAVENOUS

## 2014-02-12 MED ORDER — SODIUM BICARBONATE 8.4 % IV SOLN
INTRAVENOUS | Status: DC | PRN
  Administered 2014-02-12: 50 meq via INTRAVENOUS

## 2014-02-12 MED ORDER — PROTAMINE SULFATE 10 MG/ML IV SOLN
INTRAVENOUS | Status: AC
  Filled 2014-02-12: qty 25

## 2014-02-12 MED ORDER — CEFUROXIME SODIUM 1.5 G IJ SOLR
1.5000 g | INTRAMUSCULAR | Status: AC
  Administered 2014-02-12: .75 g via INTRAVENOUS
  Administered 2014-02-12: 1.5 g via INTRAVENOUS
  Filled 2014-02-12: qty 1.5

## 2014-02-12 MED ORDER — VANCOMYCIN HCL IN DEXTROSE 1-5 GM/200ML-% IV SOLN
1000.0000 mg | Freq: Once | INTRAVENOUS | Status: AC
  Administered 2014-02-13: 1000 mg via INTRAVENOUS
  Filled 2014-02-12: qty 200

## 2014-02-12 MED ORDER — LACTATED RINGERS IV SOLN
INTRAVENOUS | Status: DC | PRN
  Administered 2014-02-12: 12:00:00 via INTRAVENOUS

## 2014-02-12 MED ORDER — SODIUM CHLORIDE 0.9 % IJ SOLN
INTRAMUSCULAR | Status: DC | PRN
  Administered 2014-02-12 (×3): 4 mL via TOPICAL

## 2014-02-12 MED ORDER — MILRINONE IN DEXTROSE 20 MG/100ML IV SOLN
0.3000 ug/kg/min | INTRAVENOUS | Status: DC
  Administered 2014-02-12: 0.3 ug/kg/min via INTRAVENOUS
  Administered 2014-02-13: 0.375 ug/kg/min via INTRAVENOUS
  Administered 2014-02-13: 0.2 ug/kg/min via INTRAVENOUS
  Filled 2014-02-12 (×2): qty 100

## 2014-02-12 MED ORDER — SODIUM CHLORIDE 0.9 % IV SOLN
INTRAVENOUS | Status: DC
  Filled 2014-02-12: qty 30

## 2014-02-12 MED ORDER — LIDOCAINE HCL (PF) 1 % IJ SOLN
INTRAMUSCULAR | Status: AC
Start: 2014-02-12 — End: 2014-02-12
  Filled 2014-02-12: qty 30

## 2014-02-12 MED ORDER — SODIUM CHLORIDE 0.9 % IV SOLN
Freq: Once | INTRAVENOUS | Status: DC
Start: 2014-02-12 — End: 2014-02-12

## 2014-02-12 MED ORDER — FENTANYL CITRATE 0.05 MG/ML IJ SOLN
INTRAMUSCULAR | Status: AC
  Filled 2014-02-12: qty 5

## 2014-02-12 MED ORDER — ROCURONIUM BROMIDE 50 MG/5ML IV SOLN
INTRAVENOUS | Status: AC
  Filled 2014-02-12: qty 2

## 2014-02-12 MED ORDER — PLASMA-LYTE 148 IV SOLN
INTRAVENOUS | Status: AC
  Administered 2014-02-12: 500 mL
  Filled 2014-02-12: qty 2.5

## 2014-02-12 MED ORDER — DEXTROSE 5 % IV SOLN
1.5000 g | Freq: Two times a day (BID) | INTRAVENOUS | Status: AC
  Administered 2014-02-12 – 2014-02-14 (×4): 1.5 g via INTRAVENOUS
  Filled 2014-02-12 (×4): qty 1.5

## 2014-02-12 MED ORDER — DOPAMINE-DEXTROSE 3.2-5 MG/ML-% IV SOLN
0.0000 ug/kg/min | INTRAVENOUS | Status: DC
  Administered 2014-02-12: 2.63 ug/kg/min via INTRAVENOUS

## 2014-02-12 MED ORDER — INSULIN REGULAR BOLUS VIA INFUSION
0.0000 [IU] | Freq: Three times a day (TID) | INTRAVENOUS | Status: DC
  Filled 2014-02-12: qty 10

## 2014-02-12 MED ORDER — FENTANYL CITRATE 0.05 MG/ML IJ SOLN
INTRAMUSCULAR | Status: AC
  Filled 2014-02-12: qty 2

## 2014-02-12 MED ORDER — SODIUM CHLORIDE 0.9 % IV SOLN: 250.0000 mL | INTRAVENOUS | Status: DC

## 2014-02-12 MED ORDER — SODIUM CHLORIDE 0.9 % IV SOLN
Freq: Once | INTRAVENOUS | Status: AC
  Administered 2014-02-13: 10 mL/h via INTRAVENOUS

## 2014-02-12 MED ORDER — POTASSIUM CHLORIDE 2 MEQ/ML IV SOLN
80.0000 meq | INTRAVENOUS | Status: DC
  Filled 2014-02-12: qty 40

## 2014-02-12 MED ORDER — METOPROLOL TARTRATE 1 MG/ML IV SOLN: 2.5000 mg | INTRAVENOUS | Status: DC | PRN

## 2014-02-12 MED ORDER — MAGNESIUM SULFATE 50 % IJ SOLN
40.0000 meq | INTRAMUSCULAR | Status: DC
  Filled 2014-02-12: qty 10

## 2014-02-12 MED ORDER — VANCOMYCIN HCL 10 G IV SOLR
1250.0000 mg | INTRAVENOUS | Status: DC
  Filled 2014-02-12: qty 1250

## 2014-02-12 MED ORDER — ACETAMINOPHEN 500 MG PO TABS
1000.0000 mg | ORAL_TABLET | Freq: Four times a day (QID) | ORAL | Status: AC
  Administered 2014-02-14 – 2014-02-15 (×7): 1000 mg via ORAL
  Filled 2014-02-12 (×19): qty 2

## 2014-02-12 MED ORDER — PROTAMINE SULFATE 10 MG/ML IV SOLN
INTRAVENOUS | Status: DC | PRN
  Administered 2014-02-12: 350 mg via INTRAVENOUS

## 2014-02-12 MED ORDER — METOPROLOL TARTRATE 12.5 MG HALF TABLET
12.5000 mg | ORAL_TABLET | Freq: Two times a day (BID) | ORAL | Status: DC
  Filled 2014-02-12 (×3): qty 1

## 2014-02-12 MED ORDER — POTASSIUM CHLORIDE 10 MEQ/50ML IV SOLN
10.0000 meq | INTRAVENOUS | Status: AC
  Administered 2014-02-12 (×3): 10 meq via INTRAVENOUS

## 2014-02-12 MED ORDER — ACETAMINOPHEN 650 MG RE SUPP
650.0000 mg | Freq: Once | RECTAL | Status: AC
Start: 2014-02-12 — End: 2014-02-12
  Administered 2014-02-12: 650 mg via RECTAL

## 2014-02-12 MED ORDER — MIDAZOLAM HCL 2 MG/2ML IJ SOLN
2.0000 mg | INTRAMUSCULAR | Status: DC | PRN
  Administered 2014-02-13 (×3): 2 mg via INTRAVENOUS
  Filled 2014-02-12 (×3): qty 2

## 2014-02-12 MED ORDER — HEPARIN (PORCINE) IN NACL 2-0.9 UNIT/ML-% IJ SOLN
INTRAMUSCULAR | Status: AC
  Filled 2014-02-12: qty 500

## 2014-02-12 MED ORDER — EPINEPHRINE HCL 1 MG/ML IJ SOLN
0.0000 ug/min | INTRAVENOUS | Status: DC
  Filled 2014-02-12: qty 4

## 2014-02-12 MED ORDER — VANCOMYCIN HCL 1000 MG IV SOLR
INTRAVENOUS | Status: AC
  Administered 2014-02-12: 1000 mL
  Filled 2014-02-12: qty 1000

## 2014-02-12 MED ORDER — LACTATED RINGERS IV SOLN: 500.0000 mL | Freq: Once | INTRAVENOUS | Status: AC | PRN

## 2014-02-12 MED ORDER — DEXTROSE 5 % IV SOLN
750.0000 mg | INTRAVENOUS | Status: DC
  Filled 2014-02-12 (×2): qty 750

## 2014-02-12 MED ORDER — DOCUSATE SODIUM 100 MG PO CAPS
200.0000 mg | ORAL_CAPSULE | Freq: Every day | ORAL | Status: DC
  Administered 2014-02-14 – 2014-02-16 (×3): 200 mg via ORAL
  Filled 2014-02-12 (×5): qty 2

## 2014-02-12 MED ORDER — MIDAZOLAM HCL 10 MG/2ML IJ SOLN
INTRAMUSCULAR | Status: AC
  Filled 2014-02-12: qty 2

## 2014-02-12 MED ORDER — MIDAZOLAM HCL 10 MG/2ML IJ SOLN
INTRAMUSCULAR | Status: DC | PRN
  Administered 2014-02-12 (×2): 1 mg via INTRAVENOUS
  Administered 2014-02-12: 2 mg via INTRAVENOUS
  Administered 2014-02-12: 1 mg via INTRAVENOUS

## 2014-02-12 MED ORDER — BISACODYL 10 MG RE SUPP
10.0000 mg | Freq: Every day | RECTAL | Status: DC
  Administered 2014-02-13: 10 mg via RECTAL
  Filled 2014-02-12: qty 1

## 2014-02-12 MED ORDER — BISACODYL 5 MG PO TBEC
10.0000 mg | DELAYED_RELEASE_TABLET | Freq: Every day | ORAL | Status: DC
  Administered 2014-02-14 – 2014-02-16 (×3): 10 mg via ORAL
  Filled 2014-02-12 (×5): qty 2

## 2014-02-12 MED ORDER — FENTANYL CITRATE 0.05 MG/ML IJ SOLN
INTRAMUSCULAR | Status: DC | PRN
  Administered 2014-02-12: 12.5 ug via INTRAVENOUS
  Administered 2014-02-12: 25 ug via INTRAVENOUS
  Administered 2014-02-12 (×3): 12.5 ug via INTRAVENOUS

## 2014-02-12 MED ORDER — TRAMADOL HCL 50 MG PO TABS
50.0000 mg | ORAL_TABLET | ORAL | Status: DC | PRN
  Administered 2014-02-14: 50 mg via ORAL
  Administered 2014-02-16: 100 mg via ORAL
  Filled 2014-02-12: qty 1
  Filled 2014-02-12 (×2): qty 2

## 2014-02-12 MED ORDER — SODIUM CHLORIDE 0.9 % IJ SOLN: 3.0000 mL | Freq: Two times a day (BID) | INTRAMUSCULAR | Status: DC

## 2014-02-12 MED ORDER — MORPHINE SULFATE 2 MG/ML IJ SOLN
2.0000 mg | INTRAMUSCULAR | Status: DC | PRN
  Administered 2014-02-13 (×7): 2 mg via INTRAVENOUS
  Filled 2014-02-12 (×2): qty 1
  Filled 2014-02-12 (×3): qty 2

## 2014-02-12 MED ORDER — PHENYLEPHRINE HCL 10 MG/ML IJ SOLN
INTRAMUSCULAR | Status: AC
  Filled 2014-02-12: qty 4

## 2014-02-12 MED ORDER — SODIUM CHLORIDE 0.9 % IV SOLN
INTRAVENOUS | Status: AC
  Administered 2014-02-12: 100 mL/h via INTRAVENOUS

## 2014-02-12 MED ORDER — ASPIRIN 81 MG PO CHEW
324.0000 mg | CHEWABLE_TABLET | Freq: Every day | ORAL | Status: DC
  Administered 2014-02-13: 324 mg
  Filled 2014-02-12: qty 4

## 2014-02-12 MED ORDER — 0.9 % SODIUM CHLORIDE (POUR BTL) OPTIME
TOPICAL | Status: DC | PRN
  Administered 2014-02-12: 6000 mL

## 2014-02-12 MED ORDER — SODIUM CHLORIDE 0.9 % IV SOLN
10.0000 g | INTRAVENOUS | Status: DC | PRN
  Administered 2014-02-12: 5 g/h via INTRAVENOUS

## 2014-02-12 MED ORDER — PROTAMINE SULFATE 10 MG/ML IV SOLN
INTRAVENOUS | Status: AC
  Filled 2014-02-12: qty 5

## 2014-02-12 MED ORDER — LACTATED RINGERS IV SOLN
INTRAVENOUS | Status: DC | PRN
  Administered 2014-02-12 (×3): via INTRAVENOUS

## 2014-02-12 MED ORDER — PHENYLEPHRINE HCL 10 MG/ML IJ SOLN
10.0000 mg | INTRAMUSCULAR | Status: DC | PRN
  Administered 2014-02-12: 10 ug/min via INTRAVENOUS

## 2014-02-12 MED ORDER — DOPAMINE-DEXTROSE 3.2-5 MG/ML-% IV SOLN
0.0000 ug/kg/min | INTRAVENOUS | Status: DC
  Administered 2014-02-12: 3 ug/kg/min via INTRAVENOUS
  Filled 2014-02-12: qty 250

## 2014-02-12 MED ORDER — SODIUM CHLORIDE 0.9 % IR SOLN
Status: DC | PRN
  Administered 2014-02-12: 3000 mL

## 2014-02-12 MED ORDER — MIDAZOLAM HCL 5 MG/5ML IJ SOLN
INTRAMUSCULAR | Status: DC | PRN
  Administered 2014-02-12: 3 mg via INTRAVENOUS
  Administered 2014-02-12 (×2): 2 mg via INTRAVENOUS
  Administered 2014-02-12: 1 mg via INTRAVENOUS
  Administered 2014-02-12: 2 mg via INTRAVENOUS

## 2014-02-12 MED ORDER — ALBUMIN HUMAN 5 % IV SOLN: 250.0000 mL | INTRAVENOUS | Status: AC | PRN

## 2014-02-12 MED ORDER — SODIUM CHLORIDE 0.9 % IV SOLN
200.0000 ug | INTRAVENOUS | Status: DC | PRN
  Administered 2014-02-12: 0.2 ug/kg/h via INTRAVENOUS

## 2014-02-12 MED ORDER — FENTANYL CITRATE 0.05 MG/ML IJ SOLN
INTRAMUSCULAR | Status: DC | PRN
  Administered 2014-02-12: 250 ug via INTRAVENOUS
  Administered 2014-02-12: 50 ug via INTRAVENOUS
  Administered 2014-02-12 (×2): 100 ug via INTRAVENOUS
  Administered 2014-02-12 (×2): 50 ug via INTRAVENOUS
  Administered 2014-02-12: 100 ug via INTRAVENOUS
  Administered 2014-02-12: 50 ug via INTRAVENOUS
  Administered 2014-02-12: 150 ug via INTRAVENOUS
  Administered 2014-02-12: 100 ug via INTRAVENOUS

## 2014-02-12 MED ORDER — FAMOTIDINE IN NACL 20-0.9 MG/50ML-% IV SOLN
20.0000 mg | Freq: Two times a day (BID) | INTRAVENOUS | Status: DC
  Administered 2014-02-12 – 2014-02-13 (×2): 20 mg via INTRAVENOUS
  Filled 2014-02-12: qty 50

## 2014-02-12 MED ORDER — MORPHINE SULFATE 2 MG/ML IJ SOLN
1.0000 mg | INTRAMUSCULAR | Status: AC | PRN
  Administered 2014-02-12 – 2014-02-13 (×2): 2 mg via INTRAVENOUS
  Filled 2014-02-12: qty 1

## 2014-02-12 MED ORDER — LACTATED RINGERS IV SOLN
INTRAVENOUS | Status: DC | PRN
  Administered 2014-02-12 (×2): via INTRAVENOUS

## 2014-02-12 MED ORDER — METOPROLOL TARTRATE 25 MG/10 ML ORAL SUSPENSION
12.5000 mg | Freq: Two times a day (BID) | ORAL | Status: DC
  Filled 2014-02-12 (×3): qty 5

## 2014-02-12 MED ORDER — LACTATED RINGERS IV SOLN
INTRAVENOUS | Status: DC
  Administered 2014-02-12: 20 mL/h via INTRAVENOUS

## 2014-02-12 MED ORDER — NITROGLYCERIN IN D5W 200-5 MCG/ML-% IV SOLN
2.0000 ug/min | INTRAVENOUS | Status: DC
  Filled 2014-02-12: qty 250

## 2014-02-12 MED ORDER — SODIUM CHLORIDE 0.9 % IV SOLN
INTRAVENOUS | Status: DC
  Filled 2014-02-12 (×4): qty 40

## 2014-02-12 MED ORDER — DEXTROSE 5 % IV SOLN
30.0000 ug/min | INTRAVENOUS | Status: DC
  Filled 2014-02-12: qty 2

## 2014-02-12 SURGICAL SUPPLY — 159 items
ADAPTER CARDIO PERF ANTE/RETRO (ADAPTER) ×3 IMPLANT
ADPR PRFSN 84XANTGRD RTRGD (ADAPTER) ×2
APPLICATOR COTTON TIP 6IN STRL (MISCELLANEOUS) IMPLANT
APPLIER CLIP 9.375 MED OPEN (MISCELLANEOUS)
APPLIER CLIP 9.375 SM OPEN (CLIP)
APR CLP MED 9.3 20 MLT OPN (MISCELLANEOUS)
APR CLP SM 9.3 20 MLT OPN (CLIP)
ATTRACTOMAT 16X20 MAGNETIC DRP (DRAPES) ×2 IMPLANT
BAG DECANTER FOR FLEXI CONT (MISCELLANEOUS) ×3 IMPLANT
BANDAGE ELASTIC 4 VELCRO ST LF (GAUZE/BANDAGES/DRESSINGS) ×1 IMPLANT
BANDAGE ELASTIC 6 VELCRO ST LF (GAUZE/BANDAGES/DRESSINGS) ×1 IMPLANT
BLADE CORE FAN STRYKER (BLADE) ×3 IMPLANT
BLADE STERNUM SYSTEM 6 (BLADE) ×1 IMPLANT
BLADE SURG 11 STRL SS (BLADE) ×2 IMPLANT
BLADE SURG 15 STRL LF DISP TIS (BLADE) IMPLANT
BLADE SURG 15 STRL SS (BLADE) ×2
BLADE SURG ROTATE 9660 (MISCELLANEOUS) ×1 IMPLANT
BNDG GAUZE ELAST 4 BULKY (GAUZE/BANDAGES/DRESSINGS) ×1 IMPLANT
CANISTER SUCTION 2500CC (MISCELLANEOUS) ×3 IMPLANT
CANN PRFSN 3/8X14X24FR PCFC (MISCELLANEOUS)
CANN PRFSN 3/8XCNCT ST RT ANG (MISCELLANEOUS)
CANNULA BIO-MED ART (CANNULA) ×1 IMPLANT
CANNULA EZ GLIDE AORTIC 21FR (CANNULA) ×2 IMPLANT
CANNULA GUNDRY RCSP 15FR (MISCELLANEOUS) ×2 IMPLANT
CANNULA OPTISITE PERFUSION 18F (CANNULA) ×1 IMPLANT
CANNULA PRFSN 3/8X14X24FR PCFC (MISCELLANEOUS) IMPLANT
CANNULA PRFSN 3/8XCNCT RT ANG (MISCELLANEOUS) IMPLANT
CANNULA VEN MTL TIP RT (MISCELLANEOUS)
CARDIAC SUCTION (MISCELLANEOUS) ×2 IMPLANT
CARDIOBLATE CARDIAC ABLATION (MISCELLANEOUS)
CATH CPB KIT OWEN (MISCELLANEOUS) ×2 IMPLANT
CATH HEART VENT LEFT (CATHETERS) IMPLANT
CATH RETROPLEGIA CORONARY 14FR (CATHETERS) ×1 IMPLANT
CATH ROBINSON RED A/P 18FR (CATHETERS) ×1 IMPLANT
CATH THORACIC 28FR RT ANG (CATHETERS) IMPLANT
CATH THORACIC 36FR (CATHETERS) ×2 IMPLANT
CATH THORACIC 36FR RT ANG (CATHETERS) ×2 IMPLANT
CLIP APPLIE 9.375 MED OPEN (MISCELLANEOUS) IMPLANT
CLIP APPLIE 9.375 SM OPEN (CLIP) IMPLANT
CLIP FOGARTY SPRING 6M (CLIP) IMPLANT
CLIP TI MEDIUM 24 (CLIP) IMPLANT
CLIP TI WIDE RED SMALL 24 (CLIP) ×2 IMPLANT
CONN 1/2X1/2X1/2  BEN (MISCELLANEOUS) ×1
CONN 1/2X1/2X1/2 BEN (MISCELLANEOUS) ×1 IMPLANT
CONN 3/8X1/2 ST GISH (MISCELLANEOUS) ×4 IMPLANT
CONN ST 1/4X3/8  BEN (MISCELLANEOUS) ×2
CONN ST 1/4X3/8 BEN (MISCELLANEOUS) IMPLANT
CONN Y 3/8X3/8X3/8  BEN (MISCELLANEOUS)
CONN Y 3/8X3/8X3/8 BEN (MISCELLANEOUS) IMPLANT
CONT SPEC 4OZ CLIKSEAL STRL BL (MISCELLANEOUS) ×2 IMPLANT
COVER PROBE W GEL 5X96 (DRAPES) ×1 IMPLANT
COVER SURGICAL LIGHT HANDLE (MISCELLANEOUS) ×4 IMPLANT
CRADLE DONUT ADULT HEAD (MISCELLANEOUS) ×2 IMPLANT
DEVICE CARDIOBLATE CARDIAC ABL (MISCELLANEOUS) IMPLANT
DRAIN CHANNEL 32F RND 10.7 FF (WOUND CARE) ×2 IMPLANT
DRAPE CARDIOVASCULAR INCISE (DRAPES) ×2
DRAPE INCISE IOBAN 66X45 STRL (DRAPES) ×2 IMPLANT
DRAPE SLUSH/WARMER DISC (DRAPES) ×2 IMPLANT
DRAPE SRG 135X102X78XABS (DRAPES) ×1 IMPLANT
DRSG AQUACEL AG ADV 3.5X14 (GAUZE/BANDAGES/DRESSINGS) ×1 IMPLANT
DRSG COVADERM 4X14 (GAUZE/BANDAGES/DRESSINGS) ×2 IMPLANT
ELECT BLADE 4.0 EZ CLEAN MEGAD (MISCELLANEOUS) ×2
ELECT REM PT RETURN 9FT ADLT (ELECTROSURGICAL) ×4
ELECTRODE BLDE 4.0 EZ CLN MEGD (MISCELLANEOUS) IMPLANT
ELECTRODE REM PT RTRN 9FT ADLT (ELECTROSURGICAL) ×2 IMPLANT
FELT TEFLON 6X6 (MISCELLANEOUS) ×1 IMPLANT
GAUZE SPONGE 4X4 12PLY STRL (GAUZE/BANDAGES/DRESSINGS) ×4 IMPLANT
GLOVE BIO SURGEON STRL SZ 6 (GLOVE) ×6 IMPLANT
GLOVE BIO SURGEON STRL SZ 6.5 (GLOVE) IMPLANT
GLOVE BIO SURGEON STRL SZ7 (GLOVE) IMPLANT
GLOVE BIO SURGEON STRL SZ7.5 (GLOVE) IMPLANT
GLOVE BIOGEL PI IND STRL 6 (GLOVE) IMPLANT
GLOVE BIOGEL PI INDICATOR 6 (GLOVE) ×2
GLOVE ORTHO TXT STRL SZ7.5 (GLOVE) ×6 IMPLANT
GOWN STRL REUS W/ TWL LRG LVL3 (GOWN DISPOSABLE) ×4 IMPLANT
GOWN STRL REUS W/TWL LRG LVL3 (GOWN DISPOSABLE) ×16
GRAFT PTCH CORMATRIX 7X10 4PLY (Prosthesis & Implant Heart) ×1 IMPLANT
HEMOSTAT POWDER SURGIFOAM 1G (HEMOSTASIS) ×4 IMPLANT
INSERT FOGARTY 61MM (MISCELLANEOUS) IMPLANT
INSERT FOGARTY XLG (MISCELLANEOUS) ×2 IMPLANT
KIT BASIN OR (CUSTOM PROCEDURE TRAY) ×2 IMPLANT
KIT DRAINAGE VACCUM ASSIST (KITS) ×1 IMPLANT
KIT ROOM TURNOVER OR (KITS) ×2 IMPLANT
KIT SUCTION CATH 14FR (SUCTIONS) ×8 IMPLANT
KIT VASOVIEW W/TROCAR VH 2000 (KITS) ×2 IMPLANT
LEAD PACING MYOCARDI (MISCELLANEOUS) ×2 IMPLANT
LINE VENT (MISCELLANEOUS) ×1 IMPLANT
LIQUID BAND (GAUZE/BANDAGES/DRESSINGS) ×1 IMPLANT
LOOP VESSEL SUPERMAXI WHITE (MISCELLANEOUS) ×1 IMPLANT
MARKER GRAFT CORONARY BYPASS (MISCELLANEOUS) ×6 IMPLANT
NS IRRIG 1000ML POUR BTL (IV SOLUTION) ×10 IMPLANT
PACK OPEN HEART (CUSTOM PROCEDURE TRAY) ×2 IMPLANT
PAD ARMBOARD 7.5X6 YLW CONV (MISCELLANEOUS) ×4 IMPLANT
PAD DEFIB R2 (MISCELLANEOUS) ×2 IMPLANT
PAD ELECT DEFIB RADIOL ZOLL (MISCELLANEOUS) ×2 IMPLANT
PENCIL BUTTON HOLSTER BLD 10FT (ELECTRODE) ×2 IMPLANT
PUNCH AORTIC ROTATE 4.0MM (MISCELLANEOUS) IMPLANT
PUNCH AORTIC ROTATE 4.5MM 8IN (MISCELLANEOUS) IMPLANT
PUNCH AORTIC ROTATE 5MM 8IN (MISCELLANEOUS) IMPLANT
RING TRICUSPID T26 (Prosthesis & Implant Heart) ×1 IMPLANT
SET CANNULATION TOURNIQUET (MISCELLANEOUS) ×1 IMPLANT
SET CARDIOPLEGIA MPS 5001102 (MISCELLANEOUS) ×1 IMPLANT
SOLUTION ANTI FOG 6CC (MISCELLANEOUS) IMPLANT
SPONGE LAP 18X18 X RAY DECT (DISPOSABLE) ×1 IMPLANT
SPONGE LAP 4X18 X RAY DECT (DISPOSABLE) ×2 IMPLANT
STRIP PERIGUARD 6X8 (Vascular Products) ×1 IMPLANT
SUCKER INTRACARDIAC WEIGHTED (SUCKER) ×2 IMPLANT
SUT BONE WAX W31G (SUTURE) ×3 IMPLANT
SUT ETHIBON 2 0 V 52N 30 (SUTURE) ×1 IMPLANT
SUT ETHIBOND (SUTURE) ×4 IMPLANT
SUT ETHIBOND 2 0 SH (SUTURE) ×4 IMPLANT
SUT ETHIBOND 2 0 SH 36X2 (SUTURE) ×2 IMPLANT
SUT ETHIBOND 2 0 V4 (SUTURE) IMPLANT
SUT ETHIBOND 2 0V4 GREEN (SUTURE) IMPLANT
SUT ETHIBOND 2-0 RB-1 WHT (SUTURE) ×3 IMPLANT
SUT ETHIBOND 4 0 TF (SUTURE) IMPLANT
SUT ETHIBOND 5 0 C 1 30 (SUTURE) ×2 IMPLANT
SUT ETHIBOND X763 2 0 SH 1 (SUTURE) ×5 IMPLANT
SUT GORETEX CV4 TH-18 (SUTURE) ×1 IMPLANT
SUT MNCRL AB 3-0 PS2 18 (SUTURE) ×5 IMPLANT
SUT MNCRL AB 4-0 PS2 18 (SUTURE) IMPLANT
SUT PDS AB 1 CTX 36 (SUTURE) ×5 IMPLANT
SUT PROLENE 3 0 SH 1 (SUTURE) ×2 IMPLANT
SUT PROLENE 3 0 SH DA (SUTURE) ×4 IMPLANT
SUT PROLENE 3 0 SH1 36 (SUTURE) ×6 IMPLANT
SUT PROLENE 4 0 RB 1 (SUTURE) ×18
SUT PROLENE 4 0 SH DA (SUTURE) ×16 IMPLANT
SUT PROLENE 4-0 RB1 .5 CRCL 36 (SUTURE) ×2 IMPLANT
SUT PROLENE 5 0 C 1 36 (SUTURE) ×2 IMPLANT
SUT PROLENE 5 0 C1 (SUTURE) ×2 IMPLANT
SUT PROLENE 6 0 C 1 30 (SUTURE) ×1 IMPLANT
SUT PROLENE 7.0 RB 3 (SUTURE) ×6 IMPLANT
SUT PROLENE 8 0 BV175 6 (SUTURE) IMPLANT
SUT PROLENE BLUE 7 0 (SUTURE) ×4 IMPLANT
SUT PROLENE POLY MONO (SUTURE) IMPLANT
SUT SILK  1 MH (SUTURE) ×6
SUT SILK 1 MH (SUTURE) ×1 IMPLANT
SUT STEEL 6MS V (SUTURE) IMPLANT
SUT STEEL STERNAL CCS#1 18IN (SUTURE) IMPLANT
SUT STEEL SZ 6 DBL 3X14 BALL (SUTURE) IMPLANT
SUT VIC AB 2-0 CTX 27 (SUTURE) ×1 IMPLANT
SUT VIC AB 3-0 SH 8-18 (SUTURE) ×3 IMPLANT
SUTURE E-PAK OPEN HEART (SUTURE) ×2 IMPLANT
SWAB COLLECTION DEVICE MRSA (MISCELLANEOUS) ×1 IMPLANT
SYSTEM SAHARA CHEST DRAIN ATS (WOUND CARE) ×2 IMPLANT
TAPE CLOTH SURG 4X10 WHT LF (GAUZE/BANDAGES/DRESSINGS) ×1 IMPLANT
TAPE PAPER 2X10 WHT MICROPORE (GAUZE/BANDAGES/DRESSINGS) ×1 IMPLANT
TAPE UMBILICAL COTTON 1/8X30 (MISCELLANEOUS) ×1 IMPLANT
TOWEL OR 17X24 6PK STRL BLUE (TOWEL DISPOSABLE) ×4 IMPLANT
TOWEL OR 17X26 10 PK STRL BLUE (TOWEL DISPOSABLE) ×4 IMPLANT
TRAY FOLEY IC TEMP SENS 14FR (CATHETERS) IMPLANT
TRAY FOLEY IC TEMP SENS 16FR (CATHETERS) ×2 IMPLANT
TUBE ANAEROBIC SPECIMEN COL (MISCELLANEOUS) ×1 IMPLANT
TUBING INSUFFLATION (TUBING) ×2 IMPLANT
TUBING INSUFFLATION 10FT LAP (TUBING) ×2 IMPLANT
UNDERPAD 30X30 INCONTINENT (UNDERPADS AND DIAPERS) ×2 IMPLANT
VENT LEFT HEART 12002 (CATHETERS) ×2
WATER STERILE IRR 1000ML POUR (IV SOLUTION) ×4 IMPLANT
WIRE BENTSON .035X145CM (WIRE) ×1 IMPLANT

## 2014-02-12 NOTE — OR Nursing (Signed)
First call to SICU at 1920.

## 2014-02-12 NOTE — Anesthesia Procedure Notes (Signed)
Procedure Name: Intubation Date/Time: 02/12/2014 12:33 PM Performed by: Orvilla Fus A Pre-anesthesia Checklist: Patient identified, Timeout performed, Emergency Drugs available, Suction available and Patient being monitored Patient Re-evaluated:Patient Re-evaluated prior to inductionOxygen Delivery Method: Circle system utilized Preoxygenation: Pre-oxygenation with 100% oxygen Intubation Type: IV induction Ventilation: Mask ventilation without difficulty Laryngoscope Size: Mac and 3 Grade View: Grade I Tube type: Subglottic suction tube Tube size: 8.0 mm Number of attempts: 1 Airway Equipment and Method: Stylet Placement Confirmation: ETT inserted through vocal cords under direct vision,  breath sounds checked- equal and bilateral and positive ETCO2 Secured at: 23 cm Tube secured with: Tape Dental Injury: Teeth and Oropharynx as per pre-operative assessment

## 2014-02-12 NOTE — Anesthesia Preprocedure Evaluation (Signed)
Anesthesia Evaluation  Patient identified by MRN, date of birth, ID band Patient awake    Reviewed: Allergy & Precautions, H&P , NPO status , Patient's Chart, lab work & pertinent test results  Airway Mallampati: II  TM Distance: >3 FB Neck ROM: Full    Dental  (+) Teeth Intact, Dental Advisory Given   Pulmonary  breath sounds clear to auscultation        Cardiovascular Rhythm:Regular Rate:Tachycardia + Systolic murmurs    Neuro/Psych    GI/Hepatic   Endo/Other    Renal/GU      Musculoskeletal   Abdominal (+)  Abdomen: soft.    Peds  Hematology   Anesthesia Other Findings   Reproductive/Obstetrics                             Anesthesia Physical Anesthesia Plan  ASA: IV and emergent  Anesthesia Plan: General   Post-op Pain Management:    Induction: Intravenous  Airway Management Planned: Oral ETT  Additional Equipment: Arterial line, CVP and 3D TEE  Intra-op Plan:   Post-operative Plan:   Informed Consent: I have reviewed the patients History and Physical, chart, labs and discussed the procedure including the risks, benefits and alternatives for the proposed anesthesia with the patient or authorized representative who has indicated his/her understanding and acceptance.   Dental advisory given  Plan Discussed with: CRNA and Anesthesiologist  Anesthesia Plan Comments:         Anesthesia Quick Evaluation

## 2014-02-12 NOTE — Progress Notes (Signed)
CARDIOTHORACIC SURGERY BRIEF PROGRESS NOTE   Results of TEE and right heart cath c/w large ventricular septal defect with left to right shunt eminating from the LVOT just below the allograft aortic valve.  There is a large cavity filling with turbulent flow c/w disruption of the previously repaired fistula and aortic root abscess cavity.  Some images from TEE suggest the communication is primarily from the LVOT into the RV beneath the insertion of the septal leaflet of the tricuspid valve, although the shunt could be into the RV, the RA or both.  There is severe TR.  There is no insufficiency of the allograft aortic valve and no sign of false aneurysm around the aortic root, but it is impossible to discern whether or not the proximal suture line of the allograft aortic root is intact.  There are no obvious vegetations, but the possibility of persistent infection cannot be ruled out.  There clearly are no alternatives other than surgery, and under the circumstances it would be best to proceed immediately rather than risking the development of hemodynamic decompensation with any delay.  I have discussed these findings with the patient and his family while he remained on the table in the cath lab.  They understand the complicated circumstances and the high risks at stake.  We plan to proceed directly to the operating room for redo sternotomy for repair of ventricular septal defect, redo repair or replacement of the tricuspid valve, and possible redo human allograft (homograft) aortic root replacement.  All questions answered.  Bethania Schlotzhauer H 02/12/2014 11:15 AM

## 2014-02-12 NOTE — Interval H&P Note (Signed)
History and Physical Interval Note:  02/12/2014 8:27 AM  [name]  has presented today for surgery, with the diagnosis of endocarditis  The various methods of treatment have been discussed with the patient and family. After consideration of risks, benefits and other options for treatment, the patient has consented to  Procedure(s): TRANSESOPHAGEAL ECHOCARDIOGRAM (TEE) (N/A) as a surgical intervention .  The patient's history has been reviewed, patient examined, no change in status, stable for surgery.  I have reviewed the patient's chart and labs.  Questions were answered to the patient's satisfaction.     TURNER,TRACI R

## 2014-02-12 NOTE — Anesthesia Postprocedure Evaluation (Signed)
  Anesthesia Post-op Note  Patient: [name]  Procedure(s) Performed: Procedure(s): TRICUSPID VALVE REPAIR, REDO MEDIAN STERNOTOMY, REPAIR OF VENTRICULAR SEPTAL DEFECT (RECURRENT LV OUTFLOW TRACT TO RIGHT ATRIAL FISTULA), REDO CLOSURE OF PATENT FORAMEN OVALE (N/A)  Patient Location: SICU  Anesthesia Type:General  Level of Consciousness: sedated and Patient remains intubated per anesthesia plan  Airway and Oxygen Therapy: Patient remains intubated per anesthesia plan and Patient placed on Ventilator (see vital sign flow sheet for setting)  Post-op Pain: none  Post-op Assessment: Post-op Vital signs reviewed, Patient's Cardiovascular Status Stable, Respiratory Function Stable, Patent Airway and Pain level controlled  Post-op Vital Signs: stable  Last Vitals:  Filed Vitals:   02/12/14 2145  BP:   Pulse: 95  Temp: 35.1 C  Resp: 13    Complications: No apparent anesthesia complications

## 2014-02-12 NOTE — Progress Notes (Signed)
Echocardiogram Echocardiogram Transesophageal has been performed.  Dorothey Baseman 02/12/2014, 8:29 AM

## 2014-02-12 NOTE — Progress Notes (Signed)
SUBJECTIVE:  Complains of mild SOB but can lay flat  OBJECTIVE:   Vitals:   Filed Vitals:   02/12/14 0500 02/12/14 0600 02/12/14 0700 02/12/14 0800  BP: 95/63 100/56 93/68   Pulse: 110 111 111   Temp:    97.7 F (36.5 C)  TempSrc:    Oral  Resp: Height:      Weight:   178 lb 12.7 oz (81.1 kg)   SpO2: 100% 96% 96%    I&O's:   Intake/Output Summary (Last 24 hours) at 02/12/14 0834 Last data filed at 02/11/14 2200  Gross per 24 hour  Intake  505.5 ml  Output    250 ml  Net  255.5 ml   TELEMETRY: Reviewed telemetry pt in Sinus tacycardia     PHYSICAL EXAM General: Well developed, well nourished, in no acute distress Head: Eyes PERRLA, No xanthomas.   Normal cephalic and atramatic  Lungs:   Crackles at bases Heart:   HRRR but tachycardic S1 S2 Pulses are 2+ & equal. Loud 3/6 holosystolic murmur at RUSB to LLSB and apex Abdomen: Bowel sounds are positive, abdomen soft and non-tender without masses  Extremities:   No clubbing, cyanosis or edema.  DP +1 Neuro: Alert and oriented X 3. Psych:  Good affect, responds appropriately   LABS: Basic Metabolic Panel:  Recent Labs  60/45/40 1230 02/12/14 0300  NA 138 137  K 3.8 3.6  CL 102 104  CO2 24 23  GLUCOSE 88 96  BUN 20 21  CREATININE 1.18 1.14  CALCIUM 8.7 8.5  MG 1.7  --    Liver Function Tests:  Recent Labs  02/11/14 1230  AST 147*  ALT 165*  ALKPHOS 93  BILITOT 0.7  PROT 5.7*  ALBUMIN 3.2*   No results for input(s): LIPASE, AMYLASE in the last 72 hours. CBC:  Recent Labs  02/11/14 1230 02/12/14 0300  WBC 9.2 9.3  NEUTROABS 6.1  --   HGB 9.5* 9.2*  HCT 28.9* 29.1*  MCV 80.7 79.9  PLT 318 349   Cardiac Enzymes: No results for input(s): CKTOTAL, CKMB, CKMBINDEX, TROPONINI in the last 72 hours. BNP: Invalid input(s): POCBNP D-Dimer:  Recent Labs  02/11/14 1230  DDIMER 6.41*   Hemoglobin A1C: No results for input(s): HGBA1C in the last 72 hours. Fasting Lipid Panel: No  results for input(s): CHOL, HDL, LDLCALC, TRIG, CHOLHDL, LDLDIRECT in the last 72 hours. Thyroid Function Tests:  Recent Labs  02/11/14 1300  TSH 2.562   Anemia Panel: No results for input(s): VITAMINB12, FOLATE, FERRITIN, TIBC, IRON, RETICCTPCT in the last 72 hours. Coag Panel:   Lab Results  Component Value Date   INR 1.71* 02/12/2014   INR 1.42 01/02/2014   INR 1.58* 01/01/2014    RADIOLOGY: Dg Chest 2 View  02/11/2014   CLINICAL DATA:  Shortness of breath and cough history of tricuspid valve replacement and aortic root replacement in November 2015; history of bacterial meningitis  EXAM: CHEST  2 VIEW  COMPARISON:  PA and lateral chest x-ray of January 21, 2014 and January 06, 2014.  FINDINGS: The lungs are hypoinflated. There are coarse interstitial markings bilaterally. The cardiac silhouette is enlarged. The central pulmonary vascularity is prominent. There is no pleural effusion. The permanent pacemaker is in reasonable position radiographically. The right-sided PICC line tip projects over the junction of the middle and distal thirds of the SVC. There are 7 intact sternal wires present.  IMPRESSION: There is bilateral  pulmonary hypoinflation. The central pulmonary vascularity is mildly engorged consistent with CHF. This is not greatly changed from the December 10th study. Chronically increased density in the left retrocardiac region on the left is consistent with atelectasis.   Electronically Signed   By: David  [place]   On: 02/11/2014 08:44   Dg Chest 2 View  01/21/2014   CLINICAL DATA:  Bacterial endocarditis of the aortic valve. Aortic valve replacement November 2015. Soreness over incision line which shortness of breath and cough.  EXAM: CHEST  2 VIEW  COMPARISON:  01/06/2014  FINDINGS: Sternotomy wires unchanged. Dual lead left-sided pacemaker are unchanged. There has been interval placement of a right-sided PICC line which has tip over the cavoatrial junction. Lungs are  hypoinflated with improved opacification over the lung bases as there is mild residual left retrocardiac opacification. Small amount a left posterior pleural fluid. Cardiomediastinal silhouette and remainder of the exam is unchanged to include chronic deformity/irregularity over the first rib.  IMPRESSION: Improving bibasilar opacification or mild residual left retrocardiac density. Exclude a small amount of posterior left pleural fluid.   Electronically Signed   By: Elberta Fortis M.D.   On: 01/21/2014 08:41   Ct Head W Wo Contrast  02/09/2014   CLINICAL DATA:  Bacterial meningitis with multiple brain abscesses.  EXAM: CT HEAD WITHOUT AND WITH CONTRAST  TECHNIQUE: Contiguous axial images were obtained from the base of the skull through the vertex without and with intravenous contrast  CONTRAST:  26mL OMNIPAQUE IOHEXOL 300 MG/ML  SOLN  COMPARISON:  MRI brain 12/24/2013.  FINDINGS: No acute cortical infarct, hemorrhage, or mass lesion is present. The ventricles are of normal size. No significant extra-axial fluid collection is present.  The postcontrast images demonstrate no pathologic enhancement. Of note, the previously described microabscesses are likely below the resolution of CT, even with contrast.  The paranasal sinuses and mastoid air cells are clear. The osseous skull is intact.  IMPRESSION: 1. Normal CT appearance of the brain without and with contrast. 2. Of note, CT without and with contrast is less sensitive for the evaluation of persistent microabscess than MRI.   Electronically Signed   By: Gennette Pac M.D.   On: 02/09/2014 12:24   Ct Angio Chest Pe W/cm &/or Wo Cm  02/11/2014   CLINICAL DATA:  Shortness breath for 6 days, worse on exertion. Recent open heart surgery.  EXAM: CT ANGIOGRAPHY CHEST WITH CONTRAST  TECHNIQUE: Multidetector CT imaging of the chest was performed using the standard protocol during bolus administration of intravenous contrast. Multiplanar CT image reconstructions and  MIPs were obtained to evaluate the vascular anatomy.  CONTRAST:  OMNIPAQUE IOHEXOL 350 MG/ML SOLN  COMPARISON:  None.  FINDINGS: Image quality is degraded by respiratory motion. No definite pulmonary embolus. No pathologically enlarged mediastinal, hilar or axillary lymph nodes. Heart is enlarged. No pericardial effusion.  Small bilateral pleural effusions. Again, image quality is degraded by respiratory motion. Mild collapse/consolidation in both lower lobes, right greater than left, with scattered volume loss in the right middle lobe and lingula. Airway is unremarkable.  Incidental imaging of the upper abdomen shows low attenuation throughout the visualized portion of the liver. There is reflux of contrast into the IVC and hepatic veins. Tiny stones are seen in the gallbladder. Visualized portions of the adrenal glands, kidneys, spleen, pancreas, stomach and bowel are grossly unremarkable.  No worrisome lytic or sclerotic lesions.  Review of the MIP images confirms the above findings.  IMPRESSION: 1. Image quality  is rather degraded by respiratory motion. No definite pulmonary embolus. 2. Small bilateral effusions with collapse/consolidation in both lower lobes, right greater than left. Pneumonia is considered. 3. Hepatic steatosis. 4. Cholelithiasis.   Electronically Signed   By: Leanna Battles M.D.   On: 02/11/2014 16:31    Assessment/Plan 1.  Acute diastolic CHF- mainly right sided.  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  There is also severe TR noted with severe pulmonary HTN.  Will continue gentle diuresis as BP tolerates 2.  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  New systolic murmur heard by ID 2 weeks ago and noted by Dr.  Tyrone Sage yesterday with echo showing severe TR with severe pulmonary HTN and abnormal flow near the AVR with a lucent area with flow in it worrisome for AVF.  Will [plan emergent TEE this am to further assess TV and AV and look for possible fistula.  Blood cultures negative thus far and he remains on Rocephin per ID.  I will consult ID to let them know he has been admitted.  Appreciate CVTS consult.   4.  CHB s/p PPM 5.  Acute SOB related to #1   Quintella Reichert, MD  02/12/2014  8:34 AM

## 2014-02-12 NOTE — H&P (View-Only) (Signed)
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  He underwent human allograft aortic root and valve replacement with repair of the aorta to right atrial fistula and tricuspid valve repair by Dr. Tyrone Sage on 12/30/2013.  Postoperatively he remained in complete heart block, requiring transvenous permanent pacemaker placement.  He otherwise recovered remarkably quickly and was discharged home on 01/07/2014 on home intravenous Rocephin therapy. He was seen in follow-up in the office by Dr. Tyrone Sage on 01/21/2014 at which time he was still progressing nicely.  He returned to our office yesterday to see Dr. Tyrone Sage reporting an acute onset of shortness of breath beginning on 02/06/2014.  He states that prior to that he was walking on a regular basis and otherwise recovering well.  Over the past week he has experienced shortness of breath with minimal activity and occasionally at rest. He notes that his shortness of breath seems to be better if he lays flat in bed, and worse if he sits up.  These symptoms developed acutely and have remained stable over the past week. Over the last few days he has also developed some abdominal bloating. His appetite has been marginal ever since surgery but he has been eating without difficulty. Bowel function is normal.  He has not had lower extremity edema.  He has had minimal residual soreness in his  chest. He has not had any fevers or chills. He has not had any tachypalpitations or dizzy spells.  He denies any cough. At present he denies resting shortness of breath and he can lay flat in bed.  Past Medical History  Diagnosis Date  . Obesity   . Smokeless tobacco use   . Fracture of left femur   . Meningitis     Group B Strep (November 2015)  . Patent foramen ovale     Closed during procedure on December 30, 2013  . History of open heart surgery     December 30, 2013- ascending aortic root replacement, TEE  . Endocarditis     related to meningitis   . Brain abscess   . Presence of permanent cardiac pacemaker 01/04/2014    PPM MEDTRONIC FOR COMPLETE HEART BLOCK  . Thrombocytopenia 12/2013  . AKI (acute kidney injury)   . 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 Tricuspid regurgitation 02/11/2014    Past Surgical History  Procedure Laterality Date  . Ascending aortic root replacement N/A 12/30/2013    Procedure: ASCENDING AORTIC ROOT REPLACEMENT with 84mm HOMOGRAFT, CLOSURE OF AORTIC TO RIGHT ATRIAL FISTULA, CLOSURE OF PATENT FORAMEN OVALE;  Surgeon: Delight Ovens, MD;  Location: [facility] OR;  Service: Open Heart Surgery;  Laterality: N/A;  . Tricuspid valve replacement N/A 12/30/2013    Procedure: REPAIR OF TRICUSPID  VALVE LEAFLET;  Surgeon: Delight Ovens, MD;  Location: [facility] OR;  Service: Open Heart Surgery;  Laterality: N/A;  . Intraoperative transesophageal echocardiogram N/A 12/30/2013    Procedure: INTRAOPERATIVE TRANSESOPHAGEAL ECHOCARDIOGRAM;  Surgeon: Delight Ovens, MD;  Location: [facility] OR;  Service: Open Heart Surgery;  Laterality: N/A;  . Permanent pacemaker insertion N/A 01/04/2014    Procedure: PERMANENT PACEMAKER INSERTION;  Surgeon: Marinus Maw, MD;  Location: [facility] CATH LAB;  Service: Cardiovascular;  Laterality: N/A;  . Insert / replace /  remove pacemaker  01/04/2014    Family History  Problem Relation Age of Onset  . Heart murmur Brother     History   Social History  . Marital Status: Married    Spouse Name: N/A    Number of Children: N/A  . Years of Education: N/A   Occupational History  . Not on file.   Social History Main Topics  . Smoking status: Never Smoker   . Smokeless tobacco: Former Neurosurgeon    Quit date: 12/30/2013  . Alcohol Use: Yes  . Drug Use: No  . Sexual Activity: Not on file   Other Topics Concern  . Not on file   Social History Narrative    Prior to Admission medications   Medication Sig Start Date End Date Taking? Authorizing Provider  aspirin EC 325 MG EC tablet Take 1 tablet (325 mg total) by mouth daily. 01/06/14  Yes Donielle Margaretann Loveless, PA-C  bismuth subsalicylate (PEPTO BISMOL) 262 MG/15ML suspension Take by mouth every 6 (six) hours as needed for indigestion.   Yes Historical Provider, MD  cefTRIAXone (ROCEPHIN) 40 MG/ML IVPB Inject 50 mLs (2 g total) into the vein every 12 (twelve) hours. Last dose is to be given 02/10/2014. 01/06/14  Yes Donielle Margaretann Loveless, PA-C  Multiple Vitamin (MULTIVITAMIN WITH MINERALS) TABS tablet Take 1 tablet by mouth daily.   Yes Historical Provider, MD    Current Facility-Administered Medications  Medication Dose Route Frequency Provider Last Rate Last Dose  . 0.9 %  sodium chloride infusion  250 mL Intravenous PRN Dwana Melena, PA-C   Stopped at 02/11/14 2000  . acetaminophen (TYLENOL) tablet 650 mg  650 mg Oral Q4H PRN Dwana Melena, PA-C      . cefTRIAXone (ROCEPHIN) 2 g in dextrose 5 % 50 mL IVPB - Premix  2 g Intravenous Q12H Dwana Melena, PA-C   2 g at 02/11/14 3016  . feeding supplement (ENSURE COMPLETE) (ENSURE COMPLETE) liquid 237 mL  237 mL Oral BID BM [name], MD   237 mL at 02/11/14 1415  . ondansetron (ZOFRAN) injection 4 mg  4 mg Intravenous Q6H PRN Dwana Melena, PA-C      . sodium chloride 0.9 % injection 10-40 mL  10-40  mL Intracatheter PRN [name], MD   10 mL at 02/11/14 1955  . sodium chloride 0.9 % injection 3 mL  3 mL Intravenous Q12H Dwana Melena, PA-C   3 mL at 02/11/14 1541  . sodium chloride 0.9 % injection 3 mL  3 mL Intravenous PRN Dwana Melena, PA-C        No Known Allergies    Review of Systems:   General:  poor appetite, improving energy, no weight gain, no weight loss, no fever  Cardiac:  no chest pain with exertion, no chest pain at rest, + SOB with minimal exertion, occasional resting SOB which is worse sitting up and improved by lying down,  no PND, no orthopnea, no palpitations, no arrhythmia, no atrial fibrillation, no LE edema, + dizzy spells, no syncope  Respiratory:  + shortness of breath, no home oxygen, no productive cough, no dry cough, no bronchitis, no wheezing, no hemoptysis, no asthma, no pain with inspiration or cough, no sleep apnea, no CPAP at night  GI:   no difficulty swallowing, no reflux, no frequent heartburn, no hiatal hernia, no abdominal pain, no constipation, no diarrhea, no hematochezia, no hematemesis, no melena  GU:   no dysuria,  no frequency, no urinary tract infection, no hematuria, no enlarged prostate, no kidney stones, no kidney disease  Vascular:  no pain suggestive of claudication, no pain in feet, no leg cramps, no varicose veins, no DVT, no non-healing foot ulcer  Neuro:   no stroke, no TIA's, no seizures, no headaches, no temporary blindness one eye,  no slurred speech, no peripheral neuropathy, no chronic pain, no instability of gait, no memory/cognitive dysfunction  Musculoskeletal: no arthritis, no joint swelling, no myalgias, no difficulty walking, normal mobility   Skin:   no rash, no itching, no skin infections, no pressure sores or ulcerations  Psych:   no anxiety, no depression, + nervousness, + unusual recent stress  Eyes:   no blurry vision, no floaters, no recent vision changes, no wears glasses or contacts  ENT:   no hearing loss, no  loose or painful teeth, no dentures, last saw dentist last hospitalization  Hematologic:  no easy bruising, no abnormal bleeding, no clotting disorder, no frequent epistaxis  Endocrine:  no diabetes, does not check CBG's at home     Physical Exam:   BP 93/68 mmHg  Pulse 111  Temp(Src) 97.9 F (36.6 C) (Oral)  Resp 19  Ht 5\' 5"  (1.651 m)  Wt 81.8 kg (180 lb 5.4 oz)  BMI 30.01 kg/m2  SpO2 96%  General:  Mildly obese but  well-appearing  HEENT:  Unremarkable   Neck:   no JVD, no bruits, no adenopathy   Chest:   clear to auscultation, symmetrical breath sounds, no wheezes, no rhonchi   CV:   RRR, grade IV/VI holosystolic murmur best RSB  Abdomen:  soft, non-tender, no masses   Extremities:  warm, well-perfused, pulses palpable, no lower extremity edema  Rectal/GU  Deferred  Neuro:   Grossly non-focal and symmetrical throughout  Skin:   Clean and dry, no rashes, no breakdown  Diagnostic Tests:  Transthoracic Echocardiography  Patient:  [name], [name] MR #:    [id number] Study Date: 02/11/2014 Gender:   M Age:    28 Height:   165.1 cm Weight:   80.3 kg BSA:    1.94 m^2 Pt. Status: Room:    3E30C  ADMITTING  Cassell Clement ATTENDING  Olga Millers ORDERING   Wilburt Finlay W REFERRING  Dwana Melena SONOGRAPHER Arvil Chaco PERFORMING  [facility], Inpatient  cc:  ------------------------------------------------------------------- LV EF: 55% -  60%  ------------------------------------------------------------------- Indications:   Dyspnea 786.09.  ------------------------------------------------------------------- History:  PMH:  Endocarditis.  ------------------------------------------------------------------- Study Conclusions  - Left ventricle: The cavity size was mildly dilated. Wall thickness was normal. Systolic function was normal. The estimated ejection fraction was in the range of 55% to 60%. D-shaped  septum consistent with RV pressure/volume overload. - Aortic valve: There was a bioprosthetic aortic valve. There is thickening anteriorly to the aortic valve concerning for abscess. There is also a potential space between the aortic valve and the right atrium with flow into it. Transvalvular velocity was within  the normal range. There was no stenosis. - Mitral valve: There was mild regurgitation. - Right ventricle: The cavity size was mildly dilated. Pacer wire or catheter noted in right ventricle. Systolic function was normal. - Right atrium: The atrium was mildly to moderately dilated. - Tricuspid valve: There was severe regurgitation. Peak RV-RA gradient (S): 63 mm Hg. - Pulmonary arteries: PA peak pressure: 78 mm Hg (S). - Systemic veins: IVC measured 2.2 cm with < 50% respirophasic variation, suggesting RA pressure 15 mmHg.  Impressions:  - Mildly dilated LV with normal systolic function. Mildly dilated RV with normal systolic function. D-shaped interventricular septum concerning for RV pressure/volume overload. There probably is severe tricuspid regurgitation with severe pulmonary hypertension. There is a bioprosthetic aortic valve. Gradient across the valve is normal. There is thickening anterior to the valve and a potential space adjacent to the valve with flow into it. This is concerning for abscess. TEE is needed.  ------------------------------------------------------------------- Labs, prior tests, procedures, and surgery: Prosthetic aortic valve from cadaver. Permanent pacemaker system implantation. Transthoracic echocardiography. M-mode, complete 2D, spectral Doppler, and color Doppler. Birthdate: Patient birthdate: 05-10-1985. Age: Patient is 29 yr old. Sex: Gender: male. BMI: 29.5 kg/m^2. Blood pressure:   103/67 Patient status: Inpatient. Study date: Study date: 02/11/2014. Study time: 01:10 PM. Location:  Bedside.  -------------------------------------------------------------------  ------------------------------------------------------------------- Left ventricle: The cavity size was mildly dilated. Wall thickness was normal. Systolic function was normal. The estimated ejection fraction was in the range of 55% to 60%. D-shaped septum consistent with RV pressure/volume overload.  ------------------------------------------------------------------- Aortic valve: There was a bioprosthetic aortic valve. There is thickening anteriorly to the aortic valve concerning for abscess. There is also a potential space between the aortic valve and the right atrium with flow into it. Mobility was not restricted. Doppler: Transvalvular velocity was within the normal range. There was no stenosis. There was no regurgitation.  VTI ratio of LVOT to aortic valve: 0.91. Mean velocity ratio of LVOT to aortic valve: 0.73.  Mean gradient (S): 9 mm Hg. Peak gradient (S): 18 mm Hg.  ------------------------------------------------------------------- Aorta: Aortic root: The aortic root was normal in size.  ------------------------------------------------------------------- Mitral valve:  Structurally normal valve.  Mobility was not restricted. Doppler: Transvalvular velocity was within the normal range. There was no evidence for stenosis. There was mild regurgitation.  Peak gradient (D): 9 mm Hg.  ------------------------------------------------------------------- Left atrium: The atrium was normal in size.  ------------------------------------------------------------------- Right ventricle: The cavity size was mildly dilated. Pacer wire or catheter noted in right ventricle. Systolic function was normal.  ------------------------------------------------------------------- Pulmonic valve:  Structurally normal valve.  Cusp separation was normal. Doppler: Transvalvular velocity was  within the normal range. There was no evidence for stenosis. There was no regurgitation.  ------------------------------------------------------------------- Tricuspid valve:  Structurally normal valve.  Doppler: Transvalvular velocity was within the normal range. There was severe regurgitation.  ------------------------------------------------------------------- Pulmonary artery:  The main pulmonary artery was normal-sized. Systolic pressure was within the normal range.  ------------------------------------------------------------------- Right atrium: The atrium was mildly to moderately dilated. Pacer wire or catheter noted in right atrium.  ------------------------------------------------------------------- Pericardium: There was no pericardial effusion.  ------------------------------------------------------------------- Systemic veins: IVC measured 2.2 cm with < 50% respirophasic variation, suggesting RA pressure 15 mmHg.  ------------------------------------------------------------------- Measurements  Left ventricle             Value    Reference LV ID, ED, PLAX chordal    (H)   59.6 mm   43 - 52 LV ID, ES, PLAX chordal    (  H)   38.7 mm   23 - 38 LV fx shortening, PLAX chordal     35  %   >=29 LV PW thickness, ED          8.2  mm   --------- IVS/LV PW ratio, ED          0.83     <=1.3 LV e&', lateral             29.9 cm/s  --------- LV E/e&', lateral            4.88     --------- LV e&', medial             16  cm/s  --------- LV E/e&', medial            9.13     --------- LV e&', average             22.95 cm/s  --------- LV E/e&', average            6.36     ---------  Ventricular septum           Value    Reference IVS thickness, ED           6.77 mm    ---------  LVOT                  Value    Reference LVOT peak velocity, S         139  cm/s  --------- LVOT mean velocity, S         99.4 cm/s  --------- LVOT VTI, S              23.2 cm   --------- LVOT peak gradient, S         8   mm Hg ---------  Aortic valve              Value    Reference Aortic valve mean velocity, S     137  cm/s  --------- Aortic valve VTI, S          25.5 cm   --------- Aortic mean gradient, S        9   mm Hg --------- Aortic peak gradient, S        18  mm Hg --------- VTI ratio, LVOT/AV           0.91     --------- Velocity ratio, mean, LVOT/AV     0.73     ---------  Aorta                 Value    Reference Aortic root ID, ED           31  mm   ---------  Left atrium              Value    Reference LA ID, A-P, ES             51  mm   --------- LA ID/bsa, A-P         (H)   2.63 cm/m^2 <=2.2 LA volume, S              53  ml   --------- LA volume/bsa, S            27.3 ml/m^2 --------- LA volume, ES, 1-p A4C         47  ml   --------- LA volume/bsa, ES, 1-p A4C  24.2 ml/m^2 --------- LA volume, ES, 1-p A2C         50  ml   --------- LA volume/bsa, ES, 1-p A2C       25.7 ml/m^2 ---------  Mitral valve              Value    Reference Mitral E-wave peak velocity      146  cm/s  --------- Mitral deceleration time    (L)   132  ms   150 - 230 Mitral peak gradient, D        9   mm Hg ---------  Pulmonary arteries           Value    Reference PA pressure, S, DP       (H)   78  mm Hg <=30  Tricuspid valve            Value     Reference Tricuspid regurg peak velocity     398  cm/s  --------- Tricuspid peak RV-RA gradient     63  mm Hg ---------  Systemic veins             Value    Reference Estimated CVP             15  mm Hg ---------  Right ventricle            Value    Reference RV s&', lateral, S           12.2 cm/s  ---------  Legend: (L) and (H) mark values outside specified reference range.  ------------------------------------------------------------------- Prepared and Electronically Authenticated by  [name], M.D. 2015-12-31T20:48:50   Impression:  Patient presents with acute onset right-sided congestive heart failure. I personally reviewed the transthoracic echocardiogram performed yesterday afternoon and compared it with the transesophageal echocardiogram performed at the time of surgery on 12/30/2013. The patient now has wide-open severe tricuspid regurgitation with findings suggestive of dehiscence of the septal leaflet of the tricuspid valve. Although the transthoracic echocardiogram is not definitive, I think the aortic root allograft appears intact. There is no aortic insufficiency. There is no clear evidence for recurrent fistula between the aortic root or LVOT and the right atrium or right ventricle, although this will need to be investigated further w/ TEE.  Moreover, the patient does not have hypoxemia nor pulmonary edema.  I suspect that the previous tricuspid valve repair has failed, causing sudden onset severe tricuspid regurgitation with acute onset right-sided congestive heart failure.  At present there are no other clinical symptoms nor signs of persistent endocarditis.  Follow up blood cultures have been sent.    Plan:  I agree with plans to proceed with transesophageal echocardiogram as soon as possible to more carefully evaluate the aortic root allograft and make sure there is no sign of  other complicating features such as recurrent aortic root abscess or fistulous communication between the LVOT and RV.  If TEE is not definitive, right heart catheterization should be performed for oxygen saturation run to rule out fistulous communication between the left ventricular outflow tract and the right atrium or right ventricle.  We will follow closely.   I spent in excess of 60 minutes during the conduct of this hospital consultation and >50% of this time involved direct face-to-face encounter for counseling and/or coordination of the patient's care.   Salvatore Decent. Cornelius Moras, MD 02/12/2014 7:33 AM

## 2014-02-12 NOTE — Interval H&P Note (Signed)
History and Physical Interval Note:  02/12/2014 10:24 AM  [name]  has presented today for surgery, with the diagnosis of Mitral Valve Disease  The various methods of treatment have been discussed with the patient and family. After consideration of risks, benefits and other options for treatment, the patient has consented to  Procedure(s): RIGHT HEART CATH (N/A) as a surgical intervention .  The patient's history has been reviewed, patient examined, no change in status, stable for surgery.  I have reviewed the patient's chart and labs.  Questions were answered to the patient's satisfaction.     Yavonne Kiss [employer]

## 2014-02-12 NOTE — CV Procedure (Signed)
PROCEDURE NOTE  Procedure:  Transesophageal echocardiogram Operator:  Armanda Magic, MD Indications:  CHF/severe TR Complications:None IV Meds:Versed 6mg , Fentanyl  Results: Normal LV size and function Normal RV size and function Normal RA size with pacer wire noted Mildly dilated RV with pacer wire noted Normal MV with mild MR Normal PV AV bioprosthesis present that appears intact with normal leaflet function and trivial AR. There is a large potential space between the LVOT/AV annulus and the RA with significant flow into it.  The gradient across this space was and most likely represents a fistula between the LVOT and the RA.   There is severe TR noted Small PFO that was closed at time of initial study that is now open with evidence of flow across by colorflow doppler  The patient tolerated the procedure well and was transferred to the cath lab for right heart cath  Signed: Armanda Magic, MD [facility] 02/12/2014

## 2014-02-12 NOTE — Progress Notes (Signed)
Pt taken to cath lab teport given to Matthew Saras RN

## 2014-02-12 NOTE — Transfer of Care (Signed)
Immediate Anesthesia Transfer of Care Note  Patient: [name]  Procedure(s) Performed: Procedure(s): TRICUSPID VALVE REPAIR, REDO MEDIAN STERNOTOMY, REPAIR OF VENTRICULAR SEPTAL DEFECT (RECURRENT LV OUTFLOW TRACT TO RIGHT ATRIAL FISTULA), REDO CLOSURE OF PATENT FORAMEN OVALE (N/A)  Patient Location: SICU  Anesthesia Type:General  Level of Consciousness: Patient remains intubated per anesthesia plan  Airway & Oxygen Therapy: Patient remains intubated per anesthesia plan and Patient placed on Ventilator (see vital sign flow sheet for setting)  Post-op Assessment: Report given to PACU RN and Post -op Vital signs reviewed and stable  Post vital signs: Reviewed and stable  Complications: No apparent anesthesia complications

## 2014-02-12 NOTE — Progress Notes (Signed)
Echocardiogram Echocardiogram Transesophageal in the OR has been performed.  [name] 02/12/2014, 3:40 PM

## 2014-02-12 NOTE — Op Note (Signed)
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 Cornelius Moras, MD  Assistant: Coral Ceo, PA-C  Anesthesia: Kipp Brood, MD  Operative Findings:  Large ventricular septal defect with left to right shunt from LV outflow tract to right atrium due to dehiscence of previous repair and proximal suture line of human allograft aortic root graft  Normal functioning human allograft aortic valve with no aortic insufficiency  Severe tricuspid regurgtation  Recurrent patent foramen ovale  Successful repair of VSD with no residual shunt after separation from CPB  Trivial residual tricuspid regurgitation after successful valve repair  No residual interatrial communication after redo closure of patent foramen ovale    BRIEF CLINICAL NOTE AND INDICATIONS FOR SURGERY  Patient is a 29 year old white male from [place] well known to our service who presented with 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 He underwent human allograft aortic root and valve replacement with repair of the aorta to right atrial fistula and tricuspid valve repair by Dr. Tyrone Sage on 12/30/2013. Postoperatively he  remained in complete heart block, requiring transvenous permanent pacemaker placement. He otherwise recovered remarkably quickly and was discharged home on 01/07/2014 on home intravenous Rocephin therapy. He was seen in follow-up in the office by Dr. Tyrone Sage on 01/21/2014 at which time he was still progressing nicely. He returned to our office yesterday to see Dr. Tyrone Sage reporting an acute onset of shortness of breath beginning on 02/06/2014. He states that prior to that he was walking on a regular basis and otherwise recovering well. Over the past week he has experienced shortness of breath with minimal activity and occasionally at rest. He notes that his shortness of breath seems to be better if he lays flat in bed, and worse if he sits up. These symptoms developed acutely and have remained stable over the past week. Over the last few days he has also developed some abdominal bloating. His appetite has been marginal ever since surgery but he has been eating without difficulty. Bowel function is normal. He has not had lower extremity edema. He has had minimal residual soreness in his chest. He has not had any fevers or chills. He has not had any tachypalpitations or dizzy spells. He denies any cough.  He has a new loud systolic murmur on physical exam.  Transthoracic and transesophageal echocardiograms demonstrate what appears to be a very large ventricular septal defect due to recurrent fistula between the left ventricular outflow tract and the right atrium and/or right ventricle.  There is wide open severe tricuspid regurgitation.  There is a small patent foramen ovale. Right heart catheterization confirms the presence of a very large left-to-right shunt at the atrial level with step up in oxygen saturation from the inferior vena cava to the right atrium. The oxygen saturation in the distal pulmonary arteries 90%  Fick cardiac output measured 16.7 L/min.  Qp/Qs measured 1.5/1  The patient has been seen  in consultation and both he and his family were counseled at length regarding the indications, risks and potential benefits of surgery.  The imminently life-threatening nature of his problem and the high risks associated with surgery were discussed at length.  All questions have been answered, and the patient provides full informed consent for the operation as described.    DETAILS OF THE OPERATIVE PROCEDURE  Preparation:  The patient is brought to the operating room on the above mentioned date and central monitoring was established by the anesthesia team including placement of central venous catheter and left brachial arterial line. The patient is placed in the supine position on the operating table.  Intravenous antibiotics are administered. General endotracheal anesthesia is induced uneventfully. A Foley catheter is placed.  Baseline transesophageal echocardiogram was performed.  Findings were notable for a huge ventricular septal defect emanating from the left ventricular outflow tract just below the human allograft aortic valve and extending into a large cavity which subsequently communicates directly into the right atrium and possibly right ventricle at the level of the septal leaflet of the tricuspid valve. There is severe tricuspid regurgitation. There is a small patent foramen ovale. There is no aortic insufficiency. There is no sign of false aneurysm around the human allograft aortic root graft. There is mild mitral regurgitation.  The patient's chest, abdomen, both groins, and both lower extremities are prepared and draped in a sterile manner. A time out procedure is performed.   Surgical Approach:  A small incision is made in the left groin and the anterior surface of the left common femoral vein at the left femoral artery are identified.  A redo median sternotomy incision was performed.  All of the previous sternal wires are removed. The sternum is divided using an oscillating saw.  Sternal reentry is uneventful. Dissection is performed to free up the posterior surface of the sternum from the structures of the anterior mediastinum. Dissection is continued in both directions until both the left and right pleural spaces are entered safely. Dissection is now begun superiorly and the anterior surface of the aorta is identified. Free margin of the pericardium is identified on either side.   Extracorporeal Cardiopulmonary Bypass and Myocardial Protection:  Pursestring sutures are placed in the anterior surface of the left common femoral vein and the left femoral artery.  The patient is heparinized systemically.  The right internal jugular vein is cannulated with Seldinger technique and a flexible guidewire advanced into the right atrium. The right internal jugular vein is subsequently cannulated using a 15 [place] pediatric femoral venous cannula.  The left common femoral vein is cannulated using the Seldinger technique and a guidewire advanced into the right atrium using TEE guidance.  The femoral vein cannulated using a 22 French long femoral venous cannula.  The left femoral artery is cannulated with Seldinger technique and a guidewire advanced into the descending thoracic aorta under TEE guidance. The femoral artery is cannulated with a 18 French femoral arterial cannula.  Adequate heparinization is verified.     The entire pre-bypass portion of the operation was notable for stable hemodynamics.  Cardiopulmonary bypass was begun.  Dissection is now continued to free up the ascending aorta and right atrium from surrounding structures.  Dissection is quite tedious because of the diffuse inflammation and adhesions from recent previous surgery. Both the superior vena cava and the inferior vena cava are encircled with  umbilical tapes.  A cardioplegia cannula is placed in the ascending aorta.  A left ventricular vent is placed through the right superior pulmonary vein.  The patient is cooled  to 32C systemic temperature.  The aortic cross clamp is applied and cold blood cardioplegia is delivered initially in an antegrade fashion through the aortic root.  Iced saline slush is applied for topical hypothermia.  The initial cardioplegic arrest is rapid with early diastolic arrest.  Repeat doses of cardioplegia are administered intermittently throughout the entire cross clamp portion of the operation through the aortic root and through the subsequently placed coronary sinus catheter in order to maintain completely flat electrocardiogram.  Myocardial protection was felt to be excellent.   Repair of Ventricular Septal Defect:  An oblique incision is made in the right atrium after pulling the femoral venous cannula down until the tip is just below the entrance of the right atrium and tightening both the superior vena cava and the inferior vena cava umbilical tapes.  A retrograde cardioplegia cannula is placed into the coronary sinus under direct vision.  Traction sutures are placed to facilitate exposure of the right atrium and tricuspid valve.  There is an obvious small residual patent foramen ovale just below the pre-existing pledgeted suture. There is a very large hole through the medial wall of the right atrium with necrotic looking tissue around its edges just above the medial aspect of the septal leaflet of the tricuspid valve. This necrotic hole extends down underneath the septal leaflet of the tricuspid valve and leads into a large cavity which communicates directly into the left ventricular outflow tract. The size of the hole at the level of the endocardial surface of the left ventricular outflow tract is approximately the size of a quarter.  Swab cultures are sent from tissue within this abscess cavity. Preliminary Gram stain on the culture obtained from the tissue in the recurrent fistula tract was notable for the absence of any organisms.  All of the devitalized tissue within the abscess cavity  and around the edges are debrided sharply and sent for culture. There are numerous old pledgets incorporated within the debris which obviously represent dehiscence of previous closure of the fistulous tract. This includes tissue which appears to represent the anterior leaflet of the mitral valve from the human allograft aortic root graft. Debridement is continued until firm viable tissue appears apparent circumferentially around the left ventricular outflow tract free margin and the right atrium free margin. The free margin of the human allograft root graft appears to have dehisced in this region and the tissue was debridement back to good firm solid tissue. This does not appear to involve the valve leaflets themselves and there does not appear to be any communication to any space around the proximal portion of the aortic root graft which is completely socked in place. At this juncture the entire abscess cavity and fistulous tract is irrigated with copious saline solution and antibiotic solution.  A patch of bovine pericardium is soaked in saline per manufacturer instructions and subsequently trimmed to fashion an elliptical patch to close the left ventricular endocardial surface of the large hole in the left ventricular outflow tract. This patch is sewn in place using running 4-0 Prolene suture. Several interrupted pledgeted 4-0 Prolene sutures are utilized at the apex of this patch where it attaches to the inferior margin of the human allograft root graft. Several additional interrupted 4-0 pledgeted Prolene sutures are utilized to try to obliterate the space between this  patch and the subsequently placed patch to close the right atrial endocardial surface.  A second elliptical patch of bovine pericardium is trimmed to a somewhat smaller size to be utilized for closure of the defect at the right atrial level. This patch is also sewn in place using running 4-0 Prolene suture.  The inferior rim of this patch  incorporates the annulus of the tricuspid valve along the septal leaflet and the plication closure at the commissure between the septal and anterior leaflet where there was communication to the right ventricular outflow tract.  Rewarming is begun.   Tricuspid Valve Repair:  The tricuspid valve is inspected carefully.  The second layer of the patch closure has incorporated the annulus of the tricuspid valve along the septal leaflet.  Several interrupted horizontal mattress sutures are placed just beneath the septal leaflet of the tricuspid valve to close this space at the commissure between the septal and anterior leaflet where the fistulous tract has communicated to the right ventricular outflow tract.  The tricuspid valve leaflets otherwise appear normal with normal mobility and no sign of any fibrosis or thickening. One final dose of warm retrograde "hot shot" cardioplegia was administered retrograde through the coronary sinus catheter while all air was evacuated through the aortic root.  The aortic cross clamp was removed after a total cross clamp time of 135 minutes.  Ring annuloplasty is performed using interrupted 2-0 Ethibond horizontal mattress sutures placed circumferentially around the tricuspid annulus with exception of the area immediately below the triangle of Koch. The valve was sized to accept a 26 mm annuloplasty ring based upon the overall surface area of the combined anterior and posterior leaflets. An [facility] 3 annuloplasty ring (size 81mm, model #4900, serial [id number]) is implanted uneventfully. After completion of the annuloplasty the valve was tested with saline and appears to be competent.    Closure of Patent Foramen Ovale:  The patent foramen ovale is closed using simple running 4-0 Prolene suture.  The right atriotomy incision is closed using a 2 layer closure of running 4-0 Prolene suture.   Procedure Completion:  Epicardial pacing wires are fixed to the right ventricular  outflow tract and to the right atrial appendage. The patient is rewarmed to 37C temperature. The aortic and left ventricular vents are removed.  The patient is weaned and disconnected from cardiopulmonary bypass.  The patient's rhythm at separation from bypass was AV paced.  The patient was weaned from cardioplegic bypass without any inotropic support. Total cardiopulmonary bypass time for the operation was 221 minutes.  Low-dose dopamine and milrinone infusions were added for biventricular support shortly after separation from bypass.  Followup transesophageal echocardiogram performed after separation from bypass revealed a normal functioning human allograft aortic valve with no aortic insufficiency. There was no sign of any residual shunt between the left ventricular outflow tract and the right atrium. There was a well-seated annuloplasty ring in the tricuspid position. There was trivial residual central tricuspid regurgitation. There was no residual patent foramen ovale nor other intra-atrial communication.  Left ventricular function was unchanged from preoperatively.  The femoral arterial and venous cannulae were removed uneventfully and their pursestring sutures secured.  There was a palpable pulse in the distal femoral artery.  Protamine was administered to reverse the anticoagulation. The right internal jugular venous cannula was removed and manual pressure held on the neck for 15 minutes.  The mediastinum and pleural space were inspected for hemostasis and irrigated with saline solution.  There was diffuse coagulopathy.  The patient received a total of 1 pack adult platelets, 6 units fresh frozen plasma and 1 ten pack of cryoprecipitate due to coagulopathy and thrombocytopenia after separation from cardiopulmonary bypass and reversal of heparin with protamine.  The patient received 3 units packed red blood cells during the procedure due to anemia which was present preoperatively and exacerbated by acute  blood loss and hemodilution during cardiopulmonary bypass.  The mediastinum and both pleural spaces were drained using 4 chest tubes placed through separate stab incisions inferiorly.  The soft tissues anterior to the aorta were reapproximated loosely. The pericardium was reclosed using a patch of Cor-matrix bovine submucosa tissue matrix.  The sternum is closed with double strength sternal wire. The soft tissues anterior to the sternum were closed in multiple layers and the skin is closed with a running subcuticular skin closure.  The post-bypass portion of the operation was notable for stable rhythm and hemodynamics.   Patient Disposition:  The patient tolerated the procedure well and is transported to the surgical intensive care in stable condition. There are no intraoperative complications. All sponge instrument and needle counts are verified correct at completion of the operation.     Salvatore Decent. Cornelius Moras MD 02/12/2014 8:36 PM

## 2014-02-12 NOTE — Progress Notes (Signed)
[facility] BRIEF SICU PROGRESS NOTE  Day of Surgery  S/P Procedure(s) (LRB): TRICUSPID VALVE REPAIR, REDO MEDIAN STERNOTOMY, REPAIR OF VENTRICULAR SEPTAL DEFECT (RECURRENT LV OUTFLOW TRACT TO RIGHT ATRIAL FISTULA), REDO CLOSURE OF PATENT FORAMEN OVALE (N/A)   Sedated on vent AV paced w/ stable hemodynamics Chest tube output 130 mL over 1st 90 minutes in ICU UOP excellent Labs pending  Plan: Transfuse FFP and cryo for mild coagulopathy.  Otherwise routine early preop  [name] H 02/12/2014 10:27 PM

## 2014-02-12 NOTE — CV Procedure (Signed)
    Cardiac Catheterization Procedure Note  Name: [name] MRN: [id number] DOB: 20-Jul-1985  Procedure: Right Heart Cath with shunt run  Indication: Suspected fistula from LVOT to RA.    Procedural Details: The right groin was prepped, draped, and anesthetized with 1% lidocaine. Using the modified Seldinger technique a 7 French sheath was placed in the right femoral vein. A Swan-Ganz catheter was used for the right heart catheterization. Standard protocol was followed for recording of right heart pressures and sampling of oxygen saturations from IVC, RA, RV, PA, and FA. Fick cardiac output was calculated. There were no immediate procedural complications. The patient was transferred to the post catheterization recovery area for further monitoring.  Procedural Findings: Hemodynamics (mmHg) RA mean 22 with prominent v-waves to 32 RV 51/16 PA 36/19, mean 27 PCWP mean 18  Oxygen saturations: IVC 74% RA 86% RV 88% PA 90% AO 98%  Cardiac Output (Fick) 16.7  Cardiac Index (Fick) 8.9  Qp/Qs 1.5/1   Final Conclusions:  There was a step-up in oxygen saturation between the IVC and the RA (74% => 86%).  Qp/Qs 1.5/1.  Large V waves in the RA pressure tracing.  There does appear to be a left to right shunt into the right atrium.  The elevated V waves in the RA could be due to volume load from shunting rather than severe TR.  TR will need to be evaluated at the time of surgery.   [name] 02/12/2014, 11:07 AM

## 2014-02-12 NOTE — Consult Note (Addendum)
    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  He underwent human allograft aortic root and valve replacement with repair of the aorta to right atrial fistula and tricuspid valve repair by Dr. Gerhardt on 12/30/2013.  Postoperatively he remained in complete heart block, requiring transvenous permanent pacemaker placement.  He otherwise recovered remarkably quickly and was discharged home on 01/07/2014 on home intravenous Rocephin therapy. He was seen in follow-up in the office by Dr. Gerhardt on 01/21/2014 at which time he was still progressing nicely.  He returned to our office yesterday to see Dr. Gerhardt reporting an acute onset of shortness of breath beginning on 02/06/2014.  He states that prior to that he was walking on a regular basis and otherwise recovering well.  Over the past week he has experienced shortness of breath with minimal activity and occasionally at rest. He notes that his shortness of breath seems to be better if he lays flat in bed, and worse if he sits up.  These symptoms developed acutely and have remained stable over the past week. Over the last few days he has also developed some abdominal bloating. His appetite has been marginal ever since surgery but he has been eating without difficulty. Bowel function is normal.  He has not had lower extremity edema.  He has had minimal residual soreness in his  chest. He has not had any fevers or chills. He has not had any tachypalpitations or dizzy spells.  He denies any cough. At present he denies resting shortness of breath and he can lay flat in bed.  Past Medical History  Diagnosis Date  . Obesity   . Smokeless tobacco use   . Fracture of left femur   . Meningitis     Group B Strep (November 2015)  . Patent foramen ovale     Closed during procedure on December 30, 2013  . History of open heart surgery     December 30, 2013- ascending aortic root replacement, TEE  . Endocarditis     related to meningitis   . Brain abscess   . Presence of permanent cardiac pacemaker 01/04/2014    PPM MEDTRONIC FOR COMPLETE HEART BLOCK  . Thrombocytopenia 12/2013  . AKI (acute kidney injury)   . 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 Tricuspid regurgitation 02/11/2014    Past Surgical History  Procedure Laterality Date  . Ascending aortic root replacement N/A 12/30/2013    Procedure: ASCENDING AORTIC ROOT REPLACEMENT with 23mm HOMOGRAFT, CLOSURE OF AORTIC TO RIGHT ATRIAL FISTULA, CLOSURE OF PATENT FORAMEN OVALE;  Surgeon: Edward B Gerhardt, MD;  Location: [facility] OR;  Service: Open Heart Surgery;  Laterality: N/A;  . Tricuspid valve replacement N/A 12/30/2013    Procedure: REPAIR OF TRICUSPID   VALVE LEAFLET;  Surgeon: Edward B Gerhardt, MD;  Location: [facility] OR;  Service: Open Heart Surgery;  Laterality: N/A;  . Intraoperative transesophageal echocardiogram N/A 12/30/2013    Procedure: INTRAOPERATIVE TRANSESOPHAGEAL ECHOCARDIOGRAM;  Surgeon: Edward B Gerhardt, MD;  Location: [facility] OR;  Service: Open Heart Surgery;  Laterality: N/A;  . Permanent pacemaker insertion N/A 01/04/2014    Procedure: PERMANENT PACEMAKER INSERTION;  Surgeon: Gregg W Taylor, MD;  Location: [facility] CATH LAB;  Service: Cardiovascular;  Laterality: N/A;  . Insert / replace /  remove pacemaker  01/04/2014    Family History  Problem Relation Age of Onset  . Heart murmur Brother     History   Social History  . Marital Status: Married    Spouse Name: N/A    Number of Children: N/A  . Years of Education: N/A   Occupational History  . Not on file.   Social History Main Topics  . Smoking status: Never Smoker   . Smokeless tobacco: Former User    Quit date: 12/30/2013  . Alcohol Use: Yes  . Drug Use: No  . Sexual Activity: Not on file   Other Topics Concern  . Not on file   Social History Narrative    Prior to Admission medications   Medication Sig Start Date End Date Taking? Authorizing Provider  aspirin EC 325 MG EC tablet Take 1 tablet (325 mg total) by mouth daily. 01/06/14  Yes Donielle M Zimmerman, PA-C  bismuth subsalicylate (PEPTO BISMOL) 262 MG/15ML suspension Take by mouth every 6 (six) hours as needed for indigestion.   Yes Historical Provider, MD  cefTRIAXone (ROCEPHIN) 40 MG/ML IVPB Inject 50 mLs (2 g total) into the vein every 12 (twelve) hours. Last dose is to be given 02/10/2014. 01/06/14  Yes Donielle M Zimmerman, PA-C  Multiple Vitamin (MULTIVITAMIN WITH MINERALS) TABS tablet Take 1 tablet by mouth daily.   Yes Historical Provider, MD    Current Facility-Administered Medications  Medication Dose Route Frequency Provider Last Rate Last Dose  . 0.9 %  sodium chloride infusion  250 mL Intravenous PRN Bryan W Hager, PA-C   Stopped at 02/11/14 2000  . acetaminophen (TYLENOL) tablet 650 mg  650 mg Oral Q4H PRN Bryan W Hager, PA-C      . cefTRIAXone (ROCEPHIN) 2 g in dextrose 5 % 50 mL IVPB - Premix  2 g Intravenous Q12H Bryan W Hager, PA-C   2 g at 02/11/14 1838  . feeding supplement (ENSURE COMPLETE) (ENSURE COMPLETE) liquid 237 mL  237 mL Oral BID BM Brian S Crenshaw, MD   237 mL at 02/11/14 1415  . ondansetron (ZOFRAN) injection 4 mg  4 mg Intravenous Q6H PRN Bryan W Hager, PA-C      . sodium chloride 0.9 % injection 10-40 mL  10-40  mL Intracatheter PRN Brian S Crenshaw, MD   10 mL at 02/11/14 1955  . sodium chloride 0.9 % injection 3 mL  3 mL Intravenous Q12H Bryan W Hager, PA-C   3 mL at 02/11/14 1541  . sodium chloride 0.9 % injection 3 mL  3 mL Intravenous PRN Bryan W Hager, PA-C        No Known Allergies    Review of Systems:   General:  poor appetite, improving energy, no weight gain, no weight loss, no fever  Cardiac:  no chest pain with exertion, no chest pain at rest, + SOB with minimal exertion, occasional resting SOB which is worse sitting up and improved by lying down,   no PND, no orthopnea, no palpitations, no arrhythmia, no atrial fibrillation, no LE edema, + dizzy spells, no syncope  Respiratory:  + shortness of breath, no home oxygen, no productive cough, no dry cough, no bronchitis, no wheezing, no hemoptysis, no asthma, no pain with inspiration or cough, no sleep apnea, no CPAP at night  GI:   no difficulty swallowing, no reflux, no frequent heartburn, no hiatal hernia, no abdominal pain, no constipation, no diarrhea, no hematochezia, no hematemesis, no melena  GU:   no dysuria,  no frequency, no urinary tract infection, no hematuria, no enlarged prostate, no kidney stones, no kidney disease  Vascular:  no pain suggestive of claudication, no pain in feet, no leg cramps, no varicose veins, no DVT, no non-healing foot ulcer  Neuro:   no stroke, no TIA's, no seizures, no headaches, no temporary blindness one eye,  no slurred speech, no peripheral neuropathy, no chronic pain, no instability of gait, no memory/cognitive dysfunction  Musculoskeletal: no arthritis, no joint swelling, no myalgias, no difficulty walking, normal mobility   Skin:   no rash, no itching, no skin infections, no pressure sores or ulcerations  Psych:   no anxiety, no depression, + nervousness, + unusual recent stress  Eyes:   no blurry vision, no floaters, no recent vision changes, no wears glasses or contacts  ENT:   no hearing loss, no  loose or painful teeth, no dentures, last saw dentist last hospitalization  Hematologic:  no easy bruising, no abnormal bleeding, no clotting disorder, no frequent epistaxis  Endocrine:  no diabetes, does not check CBG's at home     Physical Exam:   BP 93/68 mmHg  Pulse 111  Temp(Src) 97.9 F (36.6 C) (Oral)  Resp 19  Ht 5' 5" (1.651 m)  Wt 81.8 kg (180 lb 5.4 oz)  BMI 30.01 kg/m2  SpO2 96%  General:  Mildly obese but  well-appearing  HEENT:  Unremarkable   Neck:   no JVD, no bruits, no adenopathy   Chest:   clear to auscultation, symmetrical breath sounds, no wheezes, no rhonchi   CV:   RRR, grade IV/VI holosystolic murmur best RSB  Abdomen:  soft, non-tender, no masses   Extremities:  warm, well-perfused, pulses palpable, no lower extremity edema  Rectal/GU  Deferred  Neuro:   Grossly non-focal and symmetrical throughout  Skin:   Clean and dry, no rashes, no breakdown  Diagnostic Tests:  Transthoracic Echocardiography  Patient:  Ammar, Napoleon MR #:    [id number] Study Date: 02/11/2014 Gender:   M Age:    28 Height:   165.1 cm Weight:   80.3 kg BSA:    1.94 m^2 Pt. Status: Room:    3E30C  ADMITTING  Thomas Brackbill ATTENDING  Brian Crenshaw ORDERING   Hager, Bryan W REFERRING  Hager, Bryan W SONOGRAPHER Rachel Foster PERFORMING  [facility], Inpatient  cc:  ------------------------------------------------------------------- LV EF: 55% -  60%  ------------------------------------------------------------------- Indications:   Dyspnea 786.09.  ------------------------------------------------------------------- History:  PMH:  Endocarditis.  ------------------------------------------------------------------- Study Conclusions  - Left ventricle: The cavity size was mildly dilated. Wall thickness was normal. Systolic function was normal. The estimated ejection fraction was in the range of 55% to 60%. D-shaped  septum consistent with RV pressure/volume overload. - Aortic valve: There was a bioprosthetic aortic valve. There is thickening anteriorly to the aortic valve concerning for abscess. There is also a potential space between the aortic valve and the right atrium with flow into it. Transvalvular velocity was within   the normal range. There was no stenosis. - Mitral valve: There was mild regurgitation. - Right ventricle: The cavity size was mildly dilated. Pacer wire or catheter noted in right ventricle. Systolic function was normal. - Right atrium: The atrium was mildly to moderately dilated. - Tricuspid valve: There was severe regurgitation. Peak RV-RA gradient (S): 63 mm Hg. - Pulmonary arteries: PA peak pressure: 78 mm Hg (S). - Systemic veins: IVC measured 2.2 cm with < 50% respirophasic variation, suggesting RA pressure 15 mmHg.  Impressions:  - Mildly dilated LV with normal systolic function. Mildly dilated RV with normal systolic function. D-shaped interventricular septum concerning for RV pressure/volume overload. There probably is severe tricuspid regurgitation with severe pulmonary hypertension. There is a bioprosthetic aortic valve. Gradient across the valve is normal. There is thickening anterior to the valve and a potential space adjacent to the valve with flow into it. This is concerning for abscess. TEE is needed.  ------------------------------------------------------------------- Labs, prior tests, procedures, and surgery: Prosthetic aortic valve from cadaver. Permanent pacemaker system implantation. Transthoracic echocardiography. M-mode, complete 2D, spectral Doppler, and color Doppler. Birthdate: Patient birthdate: 10/20/1985. Age: Patient is 28 yr old. Sex: Gender: male. BMI: 29.5 kg/m^2. Blood pressure:   103/67 Patient status: Inpatient. Study date: Study date: 02/11/2014. Study time: 01:10 PM. Location:  Bedside.  -------------------------------------------------------------------  ------------------------------------------------------------------- Left ventricle: The cavity size was mildly dilated. Wall thickness was normal. Systolic function was normal. The estimated ejection fraction was in the range of 55% to 60%. D-shaped septum consistent with RV pressure/volume overload.  ------------------------------------------------------------------- Aortic valve: There was a bioprosthetic aortic valve. There is thickening anteriorly to the aortic valve concerning for abscess. There is also a potential space between the aortic valve and the right atrium with flow into it. Mobility was not restricted. Doppler: Transvalvular velocity was within the normal range. There was no stenosis. There was no regurgitation.  VTI ratio of LVOT to aortic valve: 0.91. Mean velocity ratio of LVOT to aortic valve: 0.73.  Mean gradient (S): 9 mm Hg. Peak gradient (S): 18 mm Hg.  ------------------------------------------------------------------- Aorta: Aortic root: The aortic root was normal in size.  ------------------------------------------------------------------- Mitral valve:  Structurally normal valve.  Mobility was not restricted. Doppler: Transvalvular velocity was within the normal range. There was no evidence for stenosis. There was mild regurgitation.  Peak gradient (D): 9 mm Hg.  ------------------------------------------------------------------- Left atrium: The atrium was normal in size.  ------------------------------------------------------------------- Right ventricle: The cavity size was mildly dilated. Pacer wire or catheter noted in right ventricle. Systolic function was normal.  ------------------------------------------------------------------- Pulmonic valve:  Structurally normal valve.  Cusp separation was normal. Doppler: Transvalvular velocity was  within the normal range. There was no evidence for stenosis. There was no regurgitation.  ------------------------------------------------------------------- Tricuspid valve:  Structurally normal valve.  Doppler: Transvalvular velocity was within the normal range. There was severe regurgitation.  ------------------------------------------------------------------- Pulmonary artery:  The main pulmonary artery was normal-sized. Systolic pressure was within the normal range.  ------------------------------------------------------------------- Right atrium: The atrium was mildly to moderately dilated. Pacer wire or catheter noted in right atrium.  ------------------------------------------------------------------- Pericardium: There was no pericardial effusion.  ------------------------------------------------------------------- Systemic veins: IVC measured 2.2 cm with < 50% respirophasic variation, suggesting RA pressure 15 mmHg.  ------------------------------------------------------------------- Measurements  Left ventricle             Value    Reference LV ID, ED, PLAX chordal    (H)   59.6 mm   43 - 52 LV ID, ES, PLAX chordal    (  H)   38.7 mm   23 - 38 LV fx shortening, PLAX chordal     35  %   >=29 LV PW thickness, ED          8.2  mm   --------- IVS/LV PW ratio, ED          0.83     <=1.3 LV e&', lateral             29.9 cm/s  --------- LV E/e&', lateral            4.88     --------- LV e&', medial             16  cm/s  --------- LV E/e&', medial            9.13     --------- LV e&', average             22.95 cm/s  --------- LV E/e&', average            6.36     ---------  Ventricular septum           Value    Reference IVS thickness, ED           6.77 mm    ---------  LVOT                  Value    Reference LVOT peak velocity, S         139  cm/s  --------- LVOT mean velocity, S         99.4 cm/s  --------- LVOT VTI, S              23.2 cm   --------- LVOT peak gradient, S         8   mm Hg ---------  Aortic valve              Value    Reference Aortic valve mean velocity, S     137  cm/s  --------- Aortic valve VTI, S          25.5 cm   --------- Aortic mean gradient, S        9   mm Hg --------- Aortic peak gradient, S        18  mm Hg --------- VTI ratio, LVOT/AV           0.91     --------- Velocity ratio, mean, LVOT/AV     0.73     ---------  Aorta                 Value    Reference Aortic root ID, ED           31  mm   ---------  Left atrium              Value    Reference LA ID, A-P, ES             51  mm   --------- LA ID/bsa, A-P         (H)   2.63 cm/m^2 <=2.2 LA volume, S              53  ml   --------- LA volume/bsa, S            27.3 ml/m^2 --------- LA volume, ES, 1-p A4C         47  ml   --------- LA volume/bsa, ES, 1-p A4C         24.2 ml/m^2 --------- LA volume, ES, 1-p A2C         50  ml   --------- LA volume/bsa, ES, 1-p A2C       25.7 ml/m^2 ---------  Mitral valve              Value    Reference Mitral E-wave peak velocity      146  cm/s  --------- Mitral deceleration time    (L)   132  ms   150 - 230 Mitral peak gradient, D        9   mm Hg ---------  Pulmonary arteries           Value    Reference PA pressure, S, DP       (H)   78  mm Hg <=30  Tricuspid valve            Value     Reference Tricuspid regurg peak velocity     398  cm/s  --------- Tricuspid peak RV-RA gradient     63  mm Hg ---------  Systemic veins             Value    Reference Estimated CVP             15  mm Hg ---------  Right ventricle            Value    Reference RV s&', lateral, S           12.2 cm/s  ---------  Legend: (L) and (H) mark values outside specified reference range.  ------------------------------------------------------------------- Prepared and Electronically Authenticated by  Dalton McLean, M.D. 2015-12-31T20:48:50   Impression:  Patient presents with acute onset right-sided congestive heart failure. I personally reviewed the transthoracic echocardiogram performed yesterday afternoon and compared it with the transesophageal echocardiogram performed at the time of surgery on 12/30/2013. The patient now has wide-open severe tricuspid regurgitation with findings suggestive of dehiscence of the septal leaflet of the tricuspid valve. Although the transthoracic echocardiogram is not definitive, I think the aortic root allograft appears intact. There is no aortic insufficiency. There is no clear evidence for recurrent fistula between the aortic root or LVOT and the right atrium or right ventricle, although this will need to be investigated further w/ TEE.  Moreover, the patient does not have hypoxemia nor pulmonary edema.  I suspect that the previous tricuspid valve repair has failed, causing sudden onset severe tricuspid regurgitation with acute onset right-sided congestive heart failure.  At present there are no other clinical symptoms nor signs of persistent endocarditis.  Follow up blood cultures have been sent.    Plan:  I agree with plans to proceed with transesophageal echocardiogram as soon as possible to more carefully evaluate the aortic root allograft and make sure there is no sign of  other complicating features such as recurrent aortic root abscess or fistulous communication between the LVOT and RV.  If TEE is not definitive, right heart catheterization should be performed for oxygen saturation run to rule out fistulous communication between the left ventricular outflow tract and the right atrium or right ventricle.  We will follow closely.   I spent in excess of 60 minutes during the conduct of this hospital consultation and >50% of this time involved direct face-to-face encounter for counseling and/or coordination of the patient's care.   Manpreet Kemmer H. Beaux Wedemeyer, MD 02/12/2014 7:33 AM   

## 2014-02-12 NOTE — Brief Op Note (Addendum)
02/11/2014 - 02/12/2014  7:09 PM  PATIENT:  [name]  29 y.o. 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, SEVERE TRICUSPID REGURGITATION (S/P previous aortic root/valve replacement with homograft, closure of PFO, closure of LVOT to RA fistula for endocarditis)  POST-OPERATIVE DIAGNOSIS:   Same  PROCEDURE:   REDO MEDIAN STERNOTOMY  BOVINE PERICARDIAL PATCH REPAIR OF VENTRICULAR SEPTAL DEFECT (RECURRENT LV OUTFLOW TRACT TO RIGHT ATRIAL FISTULA)  TRICUSPID VALVE REPAIR (Complex valvuloplasty with 26 mm Edwards MC3 annuloplasty ring)  REDO CLOSURE OF PATENT FORAMEN OVALE  SURGEON:    Purcell Nails, MD  ASSISTANTS:  Coral Ceo, PA-C  ANESTHESIA:   Kipp Brood, MD  CROSSCLAMP TIME:   135'  CARDIOPULMONARY BYPASS TIME: 221'  FINDINGS:  Large ventricular septal defect with left to right shunt from LV outflow tract to right atrium due to dehiscence of previous repair and proximal suture line of human allograft aortic root  Normal functioning human allograft aortic valve with no aortic insufficiency  Severe tricuspid regurgtation  Recurrent patent foramen ovale  Successful repair with no residual shunt after separation from CPB  Trivial residual tricuspid regurgitation after successful valve repair  No residual interatrial communication after redo closure of patent foramen ovale  COMPLICATIONS: None  PRE-OPERATIVE WEIGHT: 81 kg  Tricuspid Etiology  Tricuspid Insufficiency: Severe  TV Disease: No.   Etiology (Choose at least one and up to five): Endocarditis.  Tricuspid Valve Procedure  Tricuspid Valve Procedure Performed:  Reconstruction w/ Annuloplasty.  Implant: Annuloplasty Device; Implant Model Number 4900, Size 26 mm, Unique Device Identifier [id number]  BASELINE WEIGHT: 81 kg  PATIENT DISPOSITION:   TO SICU IN STABLE CONDITION  Mekia Dipinto H 02/12/2014 8:26 PM

## 2014-02-13 ENCOUNTER — Inpatient Hospital Stay (HOSPITAL_COMMUNITY): Payer: BC Managed Care – PPO

## 2014-02-13 ENCOUNTER — Encounter (HOSPITAL_COMMUNITY): Payer: Self-pay | Admitting: Thoracic Surgery (Cardiothoracic Vascular Surgery)

## 2014-02-13 DIAGNOSIS — I38 Endocarditis, valve unspecified: Secondary | ICD-10-CM

## 2014-02-13 DIAGNOSIS — G610 Guillain-Barre syndrome: Secondary | ICD-10-CM

## 2014-02-13 LAB — PREPARE FRESH FROZEN PLASMA
UNIT DIVISION: 0
Unit division: 0
Unit division: 0
Unit division: 0

## 2014-02-13 LAB — GLUCOSE, CAPILLARY
GLUCOSE-CAPILLARY: 100 mg/dL — AB (ref 70–99)
GLUCOSE-CAPILLARY: 109 mg/dL — AB (ref 70–99)
GLUCOSE-CAPILLARY: 138 mg/dL — AB (ref 70–99)
GLUCOSE-CAPILLARY: 89 mg/dL (ref 70–99)
GLUCOSE-CAPILLARY: 91 mg/dL (ref 70–99)
Glucose-Capillary: 103 mg/dL — ABNORMAL HIGH (ref 70–99)
Glucose-Capillary: 105 mg/dL — ABNORMAL HIGH (ref 70–99)
Glucose-Capillary: 110 mg/dL — ABNORMAL HIGH (ref 70–99)
Glucose-Capillary: 117 mg/dL — ABNORMAL HIGH (ref 70–99)
Glucose-Capillary: 82 mg/dL (ref 70–99)
Glucose-Capillary: 98 mg/dL (ref 70–99)

## 2014-02-13 LAB — POCT I-STAT 3, ART BLOOD GAS (G3+)
ACID-BASE EXCESS: 2 mmol/L (ref 0.0–2.0)
BICARBONATE: 26.5 meq/L — AB (ref 20.0–24.0)
Bicarbonate: 25.3 mEq/L — ABNORMAL HIGH (ref 20.0–24.0)
O2 Saturation: 99 %
O2 Saturation: 94 %
Patient temperature: 97.2
Patient temperature: 98.3
TCO2: 28 mmol/L (ref 0–100)
TCO2: 27 mmol/L (ref 0–100)
pCO2 arterial: 39 mmHg (ref 35.0–45.0)
pCO2 arterial: 40.7 mmHg (ref 35.0–45.0)
pH, Arterial: 7.437 (ref 7.350–7.450)
pH, Arterial: 7.4 (ref 7.350–7.450)
pO2, Arterial: 138 mmHg — ABNORMAL HIGH (ref 80.0–100.0)
pO2, Arterial: 69 mmHg — ABNORMAL LOW (ref 80.0–100.0)

## 2014-02-13 LAB — CBC
HCT: 28.1 % — ABNORMAL LOW (ref 39.0–52.0)
HEMATOCRIT: 29 % — AB (ref 39.0–52.0)
HEMOGLOBIN: 9.5 g/dL — AB (ref 13.0–17.0)
Hemoglobin: 9.4 g/dL — ABNORMAL LOW (ref 13.0–17.0)
MCH: 27.6 pg (ref 26.0–34.0)
MCH: 26.8 pg (ref 26.0–34.0)
MCHC: 33.5 g/dL (ref 30.0–36.0)
MCHC: 32.8 g/dL (ref 30.0–36.0)
MCV: 82.6 fL (ref 78.0–100.0)
MCV: 81.7 fL (ref 78.0–100.0)
PLATELETS: 127 10*3/uL — AB (ref 150–400)
Platelets: 110 10*3/uL — ABNORMAL LOW (ref 150–400)
RBC: 3.4 MIL/uL — ABNORMAL LOW (ref 4.22–5.81)
RBC: 3.55 MIL/uL — AB (ref 4.22–5.81)
RDW: 15.7 % — ABNORMAL HIGH (ref 11.5–15.5)
RDW: 16.5 % — ABNORMAL HIGH (ref 11.5–15.5)
WBC: 11.9 10*3/uL — AB (ref 4.0–10.5)
WBC: 14.1 10*3/uL — ABNORMAL HIGH (ref 4.0–10.5)

## 2014-02-13 LAB — TYPE AND SCREEN
ABO/RH(D): A NEG
Antibody Screen: NEGATIVE
UNIT DIVISION: 0
UNIT DIVISION: 0
Unit division: 0
Unit division: 0

## 2014-02-13 LAB — PREPARE CRYOPRECIPITATE
Unit division: 0
Unit division: 0

## 2014-02-13 LAB — BASIC METABOLIC PANEL
ANION GAP: 6 (ref 5–15)
BUN: 14 mg/dL (ref 6–23)
CO2: 27 mmol/L (ref 19–32)
Calcium: 7.2 mg/dL — ABNORMAL LOW (ref 8.4–10.5)
Chloride: 109 mEq/L (ref 96–112)
Creatinine, Ser: 0.98 mg/dL (ref 0.50–1.35)
GLUCOSE: 96 mg/dL (ref 70–99)
Potassium: 3.8 mmol/L (ref 3.5–5.1)
Sodium: 142 mmol/L (ref 135–145)

## 2014-02-13 LAB — MAGNESIUM
MAGNESIUM: 2.6 mg/dL — AB (ref 1.5–2.5)
Magnesium: 2 mg/dL (ref 1.5–2.5)

## 2014-02-13 LAB — POCT I-STAT, CHEM 8
BUN: 12 mg/dL (ref 6–23)
CALCIUM ION: 1.13 mmol/L (ref 1.12–1.23)
Chloride: 106 mEq/L (ref 96–112)
Creatinine, Ser: 0.8 mg/dL (ref 0.50–1.35)
Glucose, Bld: 118 mg/dL — ABNORMAL HIGH (ref 70–99)
HCT: 29 % — ABNORMAL LOW (ref 39.0–52.0)
HEMOGLOBIN: 9.9 g/dL — AB (ref 13.0–17.0)
Potassium: 3.7 mmol/L (ref 3.5–5.1)
Sodium: 143 mmol/L (ref 135–145)
TCO2: 21 mmol/L (ref 0–100)

## 2014-02-13 LAB — CREATININE, SERUM
Creatinine, Ser: 0.99 mg/dL (ref 0.50–1.35)
GFR calc Af Amer: >90 mL/min (ref >90)
GFR calc non Af Amer: >90 mL/min (ref >90)

## 2014-02-13 LAB — APTT: APTT: 30 s (ref 24–37)

## 2014-02-13 LAB — PREPARE PLATELET PHERESIS: UNIT DIVISION: 0

## 2014-02-13 LAB — PROTIME-INR
INR: 1.4 (ref 0.00–1.49)
Prothrombin Time: 17.3 seconds — ABNORMAL HIGH (ref 11.6–15.2)

## 2014-02-13 MED ORDER — SODIUM CHLORIDE 0.9 % IV SOLN: 250.0000 mL | INTRAVENOUS | Status: DC | PRN

## 2014-02-13 MED ORDER — SODIUM CHLORIDE 0.9 % IJ SOLN
10.0000 mL | Freq: Two times a day (BID) | INTRAMUSCULAR | Status: DC
  Administered 2014-02-13: 20 mL
  Administered 2014-02-14: 10 mL
  Administered 2014-02-14: 20 mL
  Administered 2014-02-15: 10 mL

## 2014-02-13 MED ORDER — FUROSEMIDE 10 MG/ML IJ SOLN
20.0000 mg | Freq: Three times a day (TID) | INTRAMUSCULAR | Status: DC
  Administered 2014-02-13 – 2014-02-15 (×6): 20 mg via INTRAVENOUS
  Filled 2014-02-13 (×10): qty 2

## 2014-02-13 MED ORDER — SODIUM CHLORIDE 0.45 % IV SOLN
INTRAVENOUS | Status: DC
  Administered 2014-02-13: 19:00:00 via INTRAVENOUS
  Administered 2014-02-15: 10 mL/h via INTRAVENOUS

## 2014-02-13 MED ORDER — CETYLPYRIDINIUM CHLORIDE 0.05 % MT LIQD
7.0000 mL | Freq: Two times a day (BID) | OROMUCOSAL | Status: DC
  Administered 2014-02-13 – 2014-02-17 (×5): 7 mL via OROMUCOSAL

## 2014-02-13 MED ORDER — POTASSIUM CHLORIDE 10 MEQ/50ML IV SOLN
10.0000 meq | INTRAVENOUS | Status: AC
  Administered 2014-02-13 (×3): 10 meq via INTRAVENOUS

## 2014-02-13 MED ORDER — FUROSEMIDE 10 MG/ML IJ SOLN: 20.0000 mg | Freq: Four times a day (QID) | INTRAMUSCULAR | Status: DC

## 2014-02-13 MED ORDER — SODIUM CHLORIDE 0.9 % IJ SOLN: 3.0000 mL | Freq: Two times a day (BID) | INTRAMUSCULAR | Status: DC

## 2014-02-13 MED ORDER — SODIUM CHLORIDE 0.9 % IV SOLN
300.0000 mg | Freq: Two times a day (BID) | INTRAVENOUS | Status: DC
  Administered 2014-02-13 (×2): 300 mg via INTRAVENOUS
  Filled 2014-02-13 (×3): qty 300

## 2014-02-13 MED ORDER — INSULIN ASPART 100 UNIT/ML ~~LOC~~ SOLN
0.0000 [IU] | SUBCUTANEOUS | Status: DC
  Administered 2014-02-13 – 2014-02-14 (×2): 2 [IU] via SUBCUTANEOUS

## 2014-02-13 MED ORDER — SODIUM CHLORIDE 0.9 % IJ SOLN: 3.0000 mL | INTRAMUSCULAR | Status: DC | PRN

## 2014-02-13 MED ORDER — LACTATED RINGERS IV SOLN
INTRAVENOUS | Status: DC
  Administered 2014-02-13: 19:00:00 via INTRAVENOUS

## 2014-02-13 MED ORDER — ACETAMINOPHEN 325 MG PO TABS: 650.0000 mg | ORAL_TABLET | ORAL | Status: DC | PRN

## 2014-02-13 MED ORDER — SODIUM CHLORIDE 0.9 % IJ SOLN
10.0000 mL | INTRAMUSCULAR | Status: DC | PRN
  Administered 2014-02-14 – 2014-02-18 (×3): 10 mL
  Filled 2014-02-13 (×3): qty 40

## 2014-02-13 MED ORDER — ONDANSETRON HCL 4 MG/2ML IJ SOLN
4.0000 mg | Freq: Four times a day (QID) | INTRAMUSCULAR | Status: DC | PRN
  Administered 2014-02-14 – 2014-02-17 (×3): 4 mg via INTRAVENOUS
  Filled 2014-02-13 (×3): qty 2

## 2014-02-13 NOTE — Progress Notes (Signed)
Performed recruitment maneuver on patient through vent. Pt tolerated well.

## 2014-02-13 NOTE — Addendum Note (Signed)
Addendum  created 02/13/14 1007 by Kipp Brood, MD   Modules edited: Notes Section   Notes Section:  File: [id number]; Pend: [id number]; Pend: [id number]; Pend: [id number]; Pend: [id number]; Pend: [id number]; Pend: [id number]; Pend: [id number]; Pend: [id number]; Pend: [id number]; Pend: [id number]; Pend: [id number]; Pend: [id number]; Pend: [id number]; Pend: [id number]; Beverely Low: [id number]; Pend: [id number]

## 2014-02-13 NOTE — Progress Notes (Addendum)
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  Has woken up and followed commands, but somewhat combative and reaching for ET tube.  Objective: Vital signs: BP Readings from Last 1 Encounters:  02/13/14 132/63   Pulse Readings from Last 1 Encounters:  02/13/14 76   Resp Readings from Last 1 Encounters:  02/13/14 18   Temp Readings from Last 1 Encounters:  02/13/14 97.4 F (36.3 C)     Hemodynamics: CVP:  [13 mmHg-24 mmHg] 13 mmHg  Physical Exam:  Rhythm:   AV paced  Breath sounds: Coarse but fairly clear and symmetrical  Heart sounds:  RRR w/out murmur  Incisions:  Dressing dry, intact  Abdomen:  Soft, non-distended  Extremities:  Warm, well-perfused    Intake/Output from previous day: 01/01 0701 - 01/02 0700 In: 10635.3 [I.V.:5714.3; Blood:3931; NG/GT:90; IV Piggyback:900] Out: 6455 [Urine:3570; Emesis/NG output:100; Blood:1865; Chest Tube:920] Intake/Output this shift: Total I/O In: 154.6 [I.V.:74.6; NG/GT:30; IV Piggyback:50] Out: 155 [Urine:45; Chest Tube:110]  Lab Results:  CBC: Recent Labs  02/12/14 2100 02/12/14 2121 02/13/14 0333  WBC 17.1*  --  11.9*  HGB 9.9* 9.5* 9.4*  HCT 29.7* 28.0* 28.1*  PLT 103*  --  110*    BMET:  Recent Labs  02/12/14 0300  02/12/14 1844 02/12/14 2121 02/13/14 0333  NA 137  < > 140 143 142  K 3.6  < > 4.6 3.7 3.8  CL 104  < > 103  --  109  CO2 23  --   --   --  27  GLUCOSE 96  < > 116* 104* 96  BUN 21  < > 19  --  14  CREATININE 1.14  < > 0.90  --  0.98  CALCIUM 8.5  --   --   --  7.2*  < > = values in this interval not displayed.   CBG (last 3)   Recent Labs   02/12/14 2233 02/12/14 2344 02/13/14 0818  GLUCAP 98 100* 103*    ABG    Component Value Date/Time   PHART 7.437 02/13/2014 0339   PCO2ART 39.0 02/13/2014 0339   PO2ART 138.0* 02/13/2014 0339   HCO3 26.5* 02/13/2014 0339   TCO2 28 02/13/2014 0339   ACIDBASEDEF 1.0 02/12/2014 2236   O2SAT 99.0 02/13/2014 0339    CXR: PORTABLE CHEST - 1 VIEW  COMPARISON: 02/12/2014.  FINDINGS: Support apparatus: Endotracheal tube, RIGHT IJ central line and RIGHT IJ vascular sheath appear unchanged. Mediastinal drains remain present. LEFT thoracostomy tube remains present. RIGHT thoracostomy tube present. Unchanged 2 lead LEFT subclavian AICD. Enteric tube unchanged. RIGHT upper extremity PICC unchanged.  Cardiomediastinal Silhouette: Enlarged, unchanged. Tricuspid annuloplasty ring. Median sternotomy.  Lungs: Worsening airspace disease in the RIGHT lung, probably representing asymmetric pulmonary edema. The base and RIGHT mid lung are opacified. No pneumothorax.  Effusions: None visible.  Other: None.  IMPRESSION: 1. Unchanged support apparatus. 2. Cardiomegaly. 3. Increasing RIGHT lung airspace disease within unchanged appearance of the LEFT lung. This probably represents asymmetric pulmonary edema rather than infection or aspiration.   Electronically Signed  By: Juliene Pina  Lamke M.D.  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

## 2014-02-13 NOTE — Progress Notes (Signed)
Decreased FIO2 to 60% per SPO2 100%. Pt SPO2 decreased to 90%. Placed back on 80%.

## 2014-02-13 NOTE — CV Procedure (Signed)
Intraoperative Transesophageal Echocardiography Report:  [name] is a 29 year old male with a history of a bicuspid aortic valve who presented with group B streptococcus endocarditis and meningitis in November, 2015. 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 He subsequently required permanent pacemaker insertion on 01/04/2014 for complete heart block. He  was discharged home on 01/07/2014. He did well until approximately 6 days prior to admission when he began to develop progressive shortness of breath. He was admitted to [facility] on 02/11/2014 with symptoms of progressive shortness of breath and a new systolic murmur. 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 There was no evidence of aortic insufficiency. He is now scheduled to undergo redo sternotomy with repair of the tricuspid valve and possible repair of an intracardiac fistula.  Intraoperative transesophageal echocardiography was requested to evaluate  the aortic homograft and to help determine the location of any fistulous communications within the heart.  The patient was brought to the operating room at [facility] and general anesthesia was induced without difficulty. Following endotracheal intubation and orogastric suctioning, the transesophageal echocardiography probe was inserted into the esophagus without difficulty.  Impression: Pre-bypass findings:  1. Aortic valve: The homograft aortic valve was well visualized. There are no vegetations noted on the aortic valve leaflets. The leaflets appeared to open normally. There was no aortic insufficiency appreciated. There was a  cavity was adjacent to the aortic annulus in the area of the non-and right coronary cusps. There was turbulent flow  within this cavity. The cavity measured approximately 1 cm in diameter.  2. 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 This appeared to originate proximal to the aortic valve. There was suture material which appeared to be present  along the edges of the fistulous communication. There was severe turbulent flow from the left ventricular outflow tract into the right atrium just superior to the septal leaflet of the tricuspid valve. The degree of tricuspid insufficiency could not be adequately determined due to the severe turbulence within the right atrium. There are no vegetations noted on the tricuspid leaflets.  3. Mitral valve: Mitral leaflets were well visualized. There were no vegetations noted on the mitral leaflets. There was trace to 1+ mitral insufficiency with a central jet. There were no prolapsing or flail mitral leaflet segments noted.  4. Left ventricle: The left ventricular cavity was enlarged. There was interventricular septal flattening noted with a D shaped left ventricular cavity in cross section. This was consistent with right ventricular pressure or volume overload. The left ventricular contractility was hyperdynamic with an ejection fraction estimated at 70%.  5. Right ventricle: Right ventricular cavity was markedly enlarged and there was interventricular septal flattening consistent with RV volume and pressure overload.  6. Interatrial septum: There was a small patent foramen ovale noted with color Doppler. The flow was left to right and appeared  insignificant.  7. Left atrium: The left atrial cavity did not appear to be enlarged. There was no thrombus noted within the left atrial cavity or left atrial appendage. Tere was no turbulent flow within the left atrium.  8. Ascending aorta: The ascending aorta was visualized and there was no evidence of aortic root enlargement, vegetations, or  fistulous communications  noted.  9. Descending aorta: The descending aorta appeared normal  in diameter and with no atheromatous disease appreciated.   Post-bypass findings:  1. Aortic valve: The homograft aortic valve was again well visualized. The leaflets appeared to open normally and there was no aortic insufficiency. The cavity adjacent to the aortic annulus in the region of the right and noncoronary cusps could no longer be visualized. There was thickening in that region but no flow within the area where the cavity had been.  2. Aortic outflow tract: The aortic outflow tract was carefully examined using 2D echo and color Doppler. There was no longer evidence of any fistulous communication between the left ventricular outflow tract and the right atrium or right ventricle.  3. Mitral valve: Mitral valve was well visualized there were no vegetations on the mitral valve. There was 1+ mitral insufficiency. There were no prolapsing or flail segments noted.  4. Tricuspid valve: There was an annuloplasty ring in the tricuspid position. The tricuspid leaflets appeared to open  adequately. There was trace tricuspid insufficiency. There was no longer any turbulence noted within  the right atrium which had been present in the pre-operative study.  5. Left ventricle the left ventricular cavity was reduced in size. There was no longer any interventricular septal flattening. There was mild global hypokinesis of the left ventricle contractility. There also was some degree of dyssynchronous contractility due to ventricular pacing. The ejection fraction was estimated at 50-55%.  6. Right ventricle: The right ventricular cavity was reduced in size. Upon separation from CPB, there was  decreased contractility of the right ventricular free wall. However over the subsequent 30 minutes to an hour, the right ventricular function appeared to improve substantially and the right ventricular function appeared adequate at  the time the chest was closed.  7. Interatrial septum: There was no evidence of any patent foramen ovale with color Doppler.  8. Descending aorta: There were no fistulous communications between the the ascending aorta and any other cardiac structures. There were no vegetations or abscess cavities noted within or adjacent to the aorta.  Kipp Brood, MD

## 2014-02-13 NOTE — Consult Note (Addendum)
[facility]  Date of Admission:  02/11/2014  Date of Consult:  02/13/2014  Reason for Consult: Endocarditis, GBS Referring Physician: Roxy Manns  Impression/Recommendation Endocarditis, GBS VSD repair  Would Continue his high dose ceftriaxone Add rifampin due to presence of prosthetics, lower dose due to previously elevated LFTs Await his Cx's (tissue and blood) Restart his day count with plan for 6-8 weeks of further therapy Discussed with Dr Roxy Manns and his family  Thank you so much for this interesting consult,   Bobby Rumpf (pager) [phone] [email or web address]  [name] is an 29 y.o. male.  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 Valve sent to [facility] showed GBS by ribosomal 16S sequencing. He was placed on ceftriaxone, and gent (2 weeks). He was doing well (except for GI upset with ceftriaxone infusion), seen in ID f/u on 12-22. The plan at that point was to continue his anbx til 02-17-14 (1 week of anbx pre-op and 7 weeks post-op).  He had repeat CT with and without of his head that was clear of micro-abscesses.  He returns to the hospital on 02-11-14 with increasing SOB/DOE. He returned to the OR on 02-12-14 and was found to have VSD, PFO, severe TR. These were repaired. His intra-operative gram stains were (-).   He denied fever at home, has been hypothermic in hospital.   Past Medical History  Diagnosis Date  . Obesity   . Smokeless tobacco use   . Fracture of left femur   . Meningitis     Group B Strep (November 2015)  . Patent foramen ovale     Closed during procedure on December 30, 2013  . History of open heart surgery     December 30, 2013- ascending aortic root replacement, TEE  . Endocarditis     related to meningitis   . Brain abscess   . Presence of permanent cardiac pacemaker 01/04/2014    PPM MEDTRONIC FOR COMPLETE HEART BLOCK  . Thrombocytopenia 12/2013  . AKI (acute kidney injury)   . 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 Tricuspid regurgitation 02/11/2014  . 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 S/P ventricular septal defect repair + redo tricuspid valve repair + redo closure PFO 02/12/2014    Redo sternotomy for bovine pericardial patch repair of large ventricular septal defect (recurrent LVOT to RA fistula due to bacterial endocarditis)  . S/P redo tricuspid valve repair 02/12/2014    Complex valvuloplasty including patch closure of VSD with plication of septal leaflet and 26 mm Edwards mc3 ring annuloplasty  . S/P redo patent foramen ovale closure 02/12/2014  . S/P placement of cardiac pacemaker 01/04/2014    [employer]tronic (serial number [id number]) dual chamber permanent pacemaker implanted by Dr Lovena Le    Past Surgical History  Procedure Laterality Date  . Ascending aortic root replacement N/A 12/30/2013    Procedure: ASCENDING AORTIC ROOT REPLACEMENT with 83m HOMOGRAFT, CLOSURE OF AORTIC TO RIGHT ATRIAL FISTULA, CLOSURE OF PATENT FORAMEN OVALE;  Surgeon: EGrace Isaac MD;  Location: [facility] OR;  Service: Open Heart Surgery;  Laterality: N/A;  . Tricuspid valve replacement N/A 12/30/2013    Procedure: REPAIR OF TRICUSPID VALVE LEAFLET;  Surgeon: Grace Isaac, MD;  Location: [place];  Service: Open Heart Surgery;  Laterality: N/A;  . Intraoperative transesophageal echocardiogram N/A 12/30/2013    Procedure: INTRAOPERATIVE TRANSESOPHAGEAL ECHOCARDIOGRAM;  Surgeon: Grace Isaac, MD;  Location: [place];   Service: Open Heart Surgery;  Laterality: N/A;  . Permanent pacemaker insertion N/A 01/04/2014    Procedure: PERMANENT PACEMAKER INSERTION;  Surgeon: Evans Lance, MD;  Location: [facility] CATH LAB;  Service: Cardiovascular;  Laterality: N/A;  . Insert / replace / remove pacemaker  01/04/2014  . Right heart catheterization N/A 02/12/2014    Procedure: RIGHT HEART CATH;  Surgeon: Larey Dresser, MD;  Location: [facility] CATH LAB;  Service: Cardiovascular;  Laterality: N/A;     No Known Allergies  Medications:  Scheduled: . sodium chloride   Intravenous Once  . acetaminophen  1,000 mg Oral 4 times per day   Or  . acetaminophen (TYLENOL) oral liquid 160 mg/5 mL  1,000 mg Per Tube 4 times per day  . aspirin EC  325 mg Oral Daily   Or  . aspirin  324 mg Per Tube Daily  . bisacodyl  10 mg Oral Daily   Or  . bisacodyl  10 mg Rectal Daily  . cefTRIAXone (ROCEPHIN)  IV  2 g Intravenous Q12H  . cefUROXime (ZINACEF)  IV  1.5 g Intravenous Q12H  . docusate sodium  200 mg Oral Daily  . famotidine (PEPCID) IV  20 mg Intravenous Q12H  . furosemide  20 mg Intravenous 3 times per day  . insulin aspart  0-24 Units Subcutaneous 6 times per day  . [START ON 02/14/2014] pantoprazole  40 mg Oral Daily  . sodium chloride  3 mL Intravenous Q12H    Abtx:  Anti-infectives    Start     Dose/Rate Route Frequency Ordered Stop   02/13/14 0445  vancomycin (VANCOCIN) IVPB 1000 mg/200 mL premix     1,000 mg200 mL/hr over 60 Minutes Intravenous  Once 02/12/14 2147 02/13/14 0517   02/13/14 0045  cefUROXime (ZINACEF) 1.5 g in dextrose 5 % 50 mL IVPB     1.5 g100 mL/hr over 30 Minutes Intravenous Every 12 hours 02/12/14 2147 02/15/14 0044   02/12/14 1600  vancomycin (VANCOCIN) 1,000 mg in sodium chloride 0.9 % 1,000 mL irrigation      Irrigation To Surgery 02/12/14 1553 02/12/14 1640   02/12/14 1341  polymyxin B 500,000 Units, bacitracin 50,000 Units in sodium chloride irrigation 0.9 % 500 mL irrigation  Status:  Discontinued        As needed 02/12/14 1341 02/12/14 2055   02/12/14 1015  vancomycin (VANCOCIN) 1,250 mg in sodium chloride 0.9 % 250 mL IVPB  Status:  Discontinued     1,250 mg166.7 mL/hr over 90 Minutes Intravenous To Surgery 02/12/14 1011 02/12/14 2147   02/12/14 1015  cefUROXime (ZINACEF) 1.5 g in dextrose 5 % 50 mL IVPB     1.5 g100 mL/hr over 30 Minutes Intravenous To Surgery 02/12/14 1011 02/12/14 1818   02/12/14 1015  cefUROXime (ZINACEF) 750 mg in dextrose 5 % 50 mL IVPB  Status:  Discontinued     750 mg100 mL/hr over 30 Minutes Intravenous To Surgery 02/12/14 1009 02/12/14 2147   02/11/14 2000  cefTRIAXone (ROCEPHIN) 2 g in dextrose 5 % 50 mL IVPB - Premix  2 g100 mL/hr over 30 Minutes Intravenous Every 12 hours 02/11/14 1315 03/29/14 2359   02/11/14 1845  cefTRIAXone (ROCEPHIN) 1 g in dextrose 5 % 50 mL IVPB - Premix  Status:  Discontinued     1 g100 mL/hr over 30 Minutes Intravenous Every 24 hours 02/11/14 1840 02/11/14 1842      Total days of antibiotics: 53 (ceftriaxone)          Social History:  reports that he has never smoked. He quit smokeless tobacco use about 6 weeks ago. He reports that he drinks alcohol. He reports that he does not use illicit drugs.  Family History  Problem Relation Age of Onset  . Heart murmur Brother     General ROS: on vent.   Blood pressure 132/63, pulse 76, temperature 97.4 F (36.3 C), temperature source Core (Comment), resp. rate 18, height _0  (1.651 m), weight 86.8 kg (191 lb 5.8 oz), SpO2 100 %. General appearance: no distress Eyes: pupils = Lungs: clear to auscultation bilaterally Chest wall: midline wound clean, dressed.  Heart: regular rate and rhythm Abdomen: normal findings: soft, non-tender and abnormal findings:  hypoactive bowel sounds Extremities: edema none   Results for orders placed or performed during the hospital encounter of 02/11/14 (from the past 48 hour(s))  Magnesium     Status: None   Collection Time: 02/11/14 12:30  PM  Result Value Ref Range   Magnesium 1.7 1.5 - 2.5 mg/dL  Comprehensive metabolic panel     Status: Abnormal   Collection Time: 02/11/14 12:30 PM  Result Value Ref Range   Sodium 138 135 - 145 mmol/L    Comment: Please note change in reference range.   Potassium 3.8 3.5 - 5.1 mmol/L    Comment: Please note change in reference range.   Chloride 102 96 - 112 mEq/L   CO2 24 19 - 32 mmol/L   Glucose, Bld 88 70 - 99 mg/dL   BUN 20 6 - 23 mg/dL   Creatinine, Ser 1.18 0.50 - 1.35 mg/dL   Calcium 8.7 8.4 - 10.5 mg/dL   Total Protein 5.7 (L) 6.0 - 8.3 g/dL   Albumin 3.2 (L) 3.5 - 5.2 g/dL   AST 147 (H) 0 - 37 U/L   ALT 165 (H) 0 - 53 U/L   Alkaline Phosphatase 93 39 - 117 U/L   Total Bilirubin 0.7 0.3 - 1.2 mg/dL   GFR calc non Af Amer 84 (L) >90 mL/min   GFR calc Af Amer >90 >90 mL/min    Comment: (NOTE) The eGFR has been calculated using the CKD EPI equation. This calculation has not been validated in all clinical situations. eGFR's persistently <90 mL/min signify possible Chronic Kidney Disease.    Anion gap 12 5 - 15  CBC WITH DIFFERENTIAL     Status: Abnormal   Collection Time: 02/11/14 12:30 PM  Result Value Ref Range   WBC 9.2 4.0 - 10.5 K/uL   RBC 3.58 (L) 4.22 - 5.81 MIL/uL   Hemoglobin 9.5 (L) 13.0 - 17.0 g/dL   HCT 28.9 (L) 39.0 - 52.0 %   MCV 80.7 78.0 - 100.0 fL   MCH 26.5 26.0 - 34.0 pg   MCHC 32.9 30.0 - 36.0 g/dL   RDW 16.2 (H) 11.5 - 15.5 %   Platelets 318 150 - 400 K/uL   Neutrophils Relative % 66 43 - 77 %   Neutro Abs 6.1 1.7 - 7.7 K/uL   Lymphocytes Relative 14 12 -  46 %   Lymphs Abs 1.3 0.7 - 4.0 K/uL   Monocytes Relative 15 (H) 3 - 12 %   Monocytes Absolute 1.3 (H) 0.1 - 1.0 K/uL   Eosinophils Relative 4 0 - 5 %   Eosinophils Absolute 0.4 0.0 - 0.7 K/uL   Basophils Relative 1 0 - 1 %   Basophils Absolute 0.1 0.0 - 0.1 K/uL  D-dimer, quantitative     Status: Abnormal   Collection Time: 02/11/14 12:30 PM  Result Value Ref Range   D-Dimer, Quant  6.41 (H) 0.00 - 0.48 ug/mL-FEU    Comment:        AT THE INHOUSE ESTABLISHED CUTOFF VALUE OF 0.48 ug/mL FEU, THIS ASSAY HAS BEEN DOCUMENTED IN THE LITERATURE TO HAVE A SENSITIVITY AND NEGATIVE PREDICTIVE VALUE OF AT LEAST 98 TO 99%.  THE TEST RESULT SHOULD BE CORRELATED WITH AN ASSESSMENT OF THE CLINICAL PROBABILITY OF DVT / VTE.   Brain natriuretic peptide     Status: Abnormal   Collection Time: 02/11/14  1:00 PM  Result Value Ref Range   B Natriuretic Peptide 617.2 (H) 0.0 - 100.0 pg/mL    Comment: Please note change in reference range.  TSH     Status: None   Collection Time: 02/11/14  1:00 PM  Result Value Ref Range   TSH 2.562 0.350 - 4.500 uIU/mL  MRSA PCR Screening     Status: None   Collection Time: 02/11/14  8:40 PM  Result Value Ref Range   MRSA by PCR NEGATIVE NEGATIVE    Comment:        The GeneXpert MRSA Assay (FDA approved for NASAL specimens only), is one component of a comprehensive MRSA colonization surveillance program. It is not intended to diagnose MRSA infection nor to guide or monitor treatment for MRSA infections.   Urinalysis, Routine w reflex microscopic     Status: Abnormal   Collection Time: 02/11/14 10:00 PM  Result Value Ref Range   Color, Urine AMBER (A) YELLOW    Comment: BIOCHEMICALS MAY BE AFFECTED BY COLOR   APPearance CLEAR CLEAR   Specific Gravity, Urine 1.020 1.005 - 1.030   pH 5.0 5.0 - 8.0   Glucose, UA NEGATIVE NEGATIVE mg/dL   Hgb urine dipstick NEGATIVE NEGATIVE   Bilirubin Urine NEGATIVE NEGATIVE   Ketones, ur 15 (A) NEGATIVE mg/dL   Protein, ur 30 (A) NEGATIVE mg/dL   Urobilinogen, UA 0.2 0.0 - 1.0 mg/dL   Nitrite NEGATIVE NEGATIVE   Leukocytes, UA NEGATIVE NEGATIVE  Urine microscopic-add on     Status: Abnormal   Collection Time: 02/11/14 10:00 PM  Result Value Ref Range   WBC, UA 0-2 <3 WBC/hpf   RBC / HPF 0-2 <3 RBC/hpf   Bacteria, UA RARE RARE   Casts HYALINE CASTS (A) NEGATIVE  Blood gas, arterial     Status:  Abnormal   Collection Time: 02/12/14  2:38 AM  Result Value Ref Range   FIO2 0.21 %   pH, Arterial 7.502 (H) 7.350 - 7.450   pCO2 arterial 28.7 (L) 35.0 - 45.0 mmHg   pO2, Arterial 101.0 (H) 80.0 - 100.0 mmHg   Bicarbonate 22.3 20.0 - 24.0 mEq/L   TCO2 23.2 0 - 100 mmol/L   Acid-base deficit 0.5 0.0 - 2.0 mmol/L   O2 Saturation 97.9 %   Patient temperature 98.6    Collection site RIGHT RADIAL    Drawn by [id number]    Sample type ARTERIAL DRAW    Allens test (  pass/fail) PASS PASS  Basic metabolic panel     Status: Abnormal   Collection Time: 02/12/14  3:00 AM  Result Value Ref Range   Sodium 137 135 - 145 mmol/L    Comment: Please note change in reference range.   Potassium 3.6 3.5 - 5.1 mmol/L    Comment: Please note change in reference range.   Chloride 104 96 - 112 mEq/L   CO2 23 19 - 32 mmol/L   Glucose, Bld 96 70 - 99 mg/dL   BUN 21 6 - 23 mg/dL   Creatinine, Ser 1.14 0.50 - 1.35 mg/dL   Calcium 8.5 8.4 - 10.5 mg/dL   GFR calc non Af Amer 88 (L) >90 mL/min   GFR calc Af Amer >90 >90 mL/min    Comment: (NOTE) The eGFR has been calculated using the CKD EPI equation. This calculation has not been validated in all clinical situations. eGFR's persistently <90 mL/min signify possible Chronic Kidney Disease.    Anion gap 10 5 - 15  CBC     Status: Abnormal   Collection Time: 02/12/14  3:00 AM  Result Value Ref Range   WBC 9.3 4.0 - 10.5 K/uL   RBC 3.64 (L) 4.22 - 5.81 MIL/uL   Hemoglobin 9.2 (L) 13.0 - 17.0 g/dL   HCT 29.1 (L) 39.0 - 52.0 %   MCV 79.9 78.0 - 100.0 fL   MCH 25.3 (L) 26.0 - 34.0 pg   MCHC 31.6 30.0 - 36.0 g/dL   RDW 16.9 (H) 11.5 - 15.5 %   Platelets 349 150 - 400 K/uL  Protime-INR     Status: Abnormal   Collection Time: 02/12/14  3:00 AM  Result Value Ref Range   Prothrombin Time 20.2 (H) 11.6 - 15.2 seconds   INR 1.71 (H) 0.00 - 1.49  APTT     Status: None   Collection Time: 02/12/14  3:00 AM  Result Value Ref Range   aPTT 35 24 - 37 seconds  Type  and screen for Red Blood Exchange     Status: None   Collection Time: 02/12/14 10:45 AM  Result Value Ref Range   ABO/RH(D) A NEG    Antibody Screen NEG    Sample Expiration 02/15/2014    Unit Number [id number]    Blood Component Type RBC LR PHER1    Unit division 00    Status of Unit REL FROM [facility]    Transfusion Status OK TO TRANSFUSE    Crossmatch Result Compatible    Unit Number [id number]    Blood Component Type RED CELLS,LR    Unit division 00    Status of Unit ISSUED,FINAL    Transfusion Status OK TO TRANSFUSE    Crossmatch Result Compatible    Unit Number [id number]    Blood Component Type RED CELLS,LR    Unit division 00    Status of Unit ISSUED,FINAL    Transfusion Status OK TO TRANSFUSE    Crossmatch Result Compatible    Unit Number [id number]    Blood Component Type RED CELLS,LR    Unit division 00    Status of Unit ISSUED,FINAL    Transfusion Status OK TO TRANSFUSE    Crossmatch Result Compatible   I-STAT 3, venous blood gas (G3P V)     Status: Abnormal   Collection Time: 02/12/14 10:49 AM  Result Value Ref Range   pH, Ven 7.421 (H) 7.250 - 7.300   pCO2, Ven 33.8 (L) 45.0 - 50.0 mmHg  pO2, Ven 38.0 30.0 - 45.0 mmHg   Bicarbonate 22.0 20.0 - 24.0 mEq/L   TCO2 23 0 - 100 mmol/L   O2 Saturation 74.0 %   Acid-base deficit 2.0 0.0 - 2.0 mmol/L   Sample type VENOUS    Comment NOTIFIED PHYSICIAN   I-STAT 3, venous blood gas (G3P V)     Status: Abnormal   Collection Time: 02/12/14 10:50 AM  Result Value Ref Range   pH, Ven 7.413 (H) 7.250 - 7.300   pCO2, Ven 33.5 (L) 45.0 - 50.0 mmHg   pO2, Ven 47.0 (H) 30.0 - 45.0 mmHg   Bicarbonate 21.4 20.0 - 24.0 mEq/L   TCO2 22 0 - 100 mmol/L   O2 Saturation 84.0 %   Acid-base deficit 3.0 (H) 0.0 - 2.0 mmol/L   Sample type VENOUS   I-STAT 3, venous blood gas (G3P V)     Status: Abnormal   Collection Time: 02/12/14 10:52 AM  Result Value Ref Range   pH, Ven 7.415 (H) 7.250 - 7.300   pCO2, Ven 32.8 (L)  45.0 - 50.0 mmHg   pO2, Ven 56.0 (H) 30.0 - 45.0 mmHg   Bicarbonate 21.0 20.0 - 24.0 mEq/L   TCO2 22 0 - 100 mmol/L   O2 Saturation 90.0 %   Acid-base deficit 3.0 (H) 0.0 - 2.0 mmol/L   Sample type VENOUS   I-STAT 3, arterial blood gas (G3+)     Status: Abnormal   Collection Time: 02/12/14 10:54 AM  Result Value Ref Range   pH, Arterial 7.437 7.350 - 7.450   pCO2 arterial 35.3 35.0 - 45.0 mmHg   pO2, Arterial 102.0 (H) 80.0 - 100.0 mmHg   Bicarbonate 23.8 20.0 - 24.0 mEq/L   TCO2 25 0 - 100 mmol/L   O2 Saturation 98.0 %   Sample type ARTERIAL   I-STAT 3, venous blood gas (G3P V)     Status: Abnormal   Collection Time: 02/12/14 10:57 AM  Result Value Ref Range   pH, Ven 7.412 (H) 7.250 - 7.300   pCO2, Ven 33.7 (L) 45.0 - 50.0 mmHg   pO2, Ven 53.0 (H) 30.0 - 45.0 mmHg   Bicarbonate 21.5 20.0 - 24.0 mEq/L   TCO2 22 0 - 100 mmol/L   O2 Saturation 88.0 %   Acid-base deficit 3.0 (H) 0.0 - 2.0 mmol/L   Sample type VENOUS   I-STAT 3, venous blood gas (G3P V)     Status: Abnormal   Collection Time: 02/12/14 10:59 AM  Result Value Ref Range   pH, Ven 7.426 (H) 7.250 - 7.300   pCO2, Ven 31.6 (L) 45.0 - 50.0 mmHg   pO2, Ven 49.0 (H) 30.0 - 45.0 mmHg   Bicarbonate 20.8 20.0 - 24.0 mEq/L   TCO2 22 0 - 100 mmol/L   O2 Saturation 86.0 %   Acid-base deficit 3.0 (H) 0.0 - 2.0 mmol/L   Sample type VENOUS   Prepare RBC (crossmatch)     Status: None   Collection Time: 02/12/14 11:13 AM  Result Value Ref Range   Order Confirmation ORDER PROCESSED BY BLOOD BANK   I-STAT, chem 8     Status: Abnormal   Collection Time: 02/12/14 12:54 PM  Result Value Ref Range   Sodium 139 135 - 145 mmol/L   Potassium 3.6 3.5 - 5.1 mmol/L   Chloride 105 96 - 112 mEq/L   BUN 22 6 - 23 mg/dL   Creatinine, Ser 1.00 0.50 - 1.35 mg/dL  Glucose, Bld 108 (H) 70 - 99 mg/dL   Calcium, Ion 1.13 1.12 - 1.23 mmol/L   TCO2 20 0 - 100 mmol/L   Hemoglobin 9.9 (L) 13.0 - 17.0 g/dL   HCT 29.0 (L) 39.0 - 52.0 %    I-STAT, chem 8     Status: Abnormal   Collection Time: 02/12/14  2:17 PM  Result Value Ref Range   Sodium 137 135 - 145 mmol/L   Potassium 3.9 3.5 - 5.1 mmol/L   Chloride 105 96 - 112 mEq/L   BUN 20 6 - 23 mg/dL   Creatinine, Ser 0.90 0.50 - 1.35 mg/dL   Glucose, Bld 103 (H) 70 - 99 mg/dL   Calcium, Ion 1.11 (L) 1.12 - 1.23 mmol/L   TCO2 20 0 - 100 mmol/L   Hemoglobin 9.2 (L) 13.0 - 17.0 g/dL   HCT 27.0 (L) 39.0 - 52.0 %  I-STAT, chem 8     Status: Abnormal   Collection Time: 02/12/14  2:51 PM  Result Value Ref Range   Sodium 133 (L) 135 - 145 mmol/L   Potassium 3.7 3.5 - 5.1 mmol/L   Chloride 97 96 - 112 mEq/L   BUN 20 6 - 23 mg/dL   Creatinine, Ser 0.90 0.50 - 1.35 mg/dL   Glucose, Bld 104 (H) 70 - 99 mg/dL   Calcium, Ion 0.99 (L) 1.12 - 1.23 mmol/L   TCO2 20 0 - 100 mmol/L   Hemoglobin 6.8 (LL) 13.0 - 17.0 g/dL   HCT 20.0 (L) 39.0 - 52.0 %  I-STAT 3, arterial blood gas (G3+)     Status: Abnormal   Collection Time: 02/12/14  2:54 PM  Result Value Ref Range   pH, Arterial 7.359 7.350 - 7.450   pCO2 arterial 40.1 35.0 - 45.0 mmHg   pO2, Arterial 310.0 (H) 80.0 - 100.0 mmHg   Bicarbonate 22.6 20.0 - 24.0 mEq/L   TCO2 24 0 - 100 mmol/L   O2 Saturation 100.0 %   Acid-base deficit 3.0 (H) 0.0 - 2.0 mmol/L   Sample type ARTERIAL   Gram stain     Status: None   Collection Time: 02/12/14  3:45 PM  Result Value Ref Range   Specimen Description ABSCESS    Special Requests RIGHT ATRIAL FISTULA,PT ON ZINACEF    Gram Stain      FEW WBC PRESENT,BOTH PMN AND MONONUCLEAR NO ORGANISMS SEEN    Report Status 02/12/2014 FINAL   I-STAT glucose     Status: Abnormal   Collection Time: 02/12/14  3:52 PM  Result Value Ref Range   Operator id [id number]    Glucose, Bld 162 (H) 70 - 99 mg/dL  I-STAT glucose     Status: Abnormal   Collection Time: 02/12/14  4:51 PM  Result Value Ref Range   Operator id [id number]    Glucose, Bld 130 (H) 70 - 99 mg/dL  Hemoglobin and hematocrit, blood     Status:  Abnormal   Collection Time: 02/12/14  5:12 PM  Result Value Ref Range   Hemoglobin 5.8 (LL) 13.0 - 17.0 g/dL    Comment: REPEATED TO VERIFY CRITICAL RESULT CALLED TO, READ BACK BY AND VERIFIED WITH: MARIA PELANT,RN 02/12/14 1735 M SHIPMAN    HCT 17.8 (L) 39.0 - 52.0 %  Platelet count     Status: Abnormal   Collection Time: 02/12/14  5:12 PM  Result Value Ref Range   Platelets 110 (L) 150 - 400 K/uL    Comment: PLATELET  COUNT CONFIRMED BY SMEAR REPEATED TO VERIFY SPECIMEN CHECKED FOR CLOTS DELTA CHECK NOTED   Fibrinogen     Status: None   Collection Time: 02/12/14  5:12 PM  Result Value Ref Range   Fibrinogen 247 204 - 475 mg/dL  I-STAT, chem 8     Status: Abnormal   Collection Time: 02/12/14  5:23 PM  Result Value Ref Range   Sodium 138 135 - 145 mmol/L   Potassium 4.2 3.5 - 5.1 mmol/L   Chloride 101 96 - 112 mEq/L   BUN 19 6 - 23 mg/dL   Creatinine, Ser 1.00 0.50 - 1.35 mg/dL   Glucose, Bld 113 (H) 70 - 99 mg/dL   Calcium, Ion 0.97 (L) 1.12 - 1.23 mmol/L   TCO2 19 0 - 100 mmol/L   Hemoglobin 6.8 (LL) 13.0 - 17.0 g/dL   HCT 20.0 (L) 39.0 - 52.0 %  Prepare fresh frozen plasma     Status: None   Collection Time: 02/12/14  5:38 PM  Result Value Ref Range   Unit Number [id number]    Blood Component Type THAWED PLASMA    Unit division 00    Status of Unit ISSUED,FINAL    Transfusion Status OK TO TRANSFUSE    Unit Number [id number]    Blood Component Type THAWED PLASMA    Unit division 00    Status of Unit ISSUED,FINAL    Transfusion Status OK TO TRANSFUSE   I-STAT, chem 8     Status: Abnormal   Collection Time: 02/12/14  6:44 PM  Result Value Ref Range   Sodium 140 135 - 145 mmol/L   Potassium 4.6 3.5 - 5.1 mmol/L   Chloride 103 96 - 112 mEq/L   BUN 19 6 - 23 mg/dL   Creatinine, Ser 0.90 0.50 - 1.35 mg/dL   Glucose, Bld 116 (H) 70 - 99 mg/dL   Calcium, Ion 0.78 (L) 1.12 - 1.23 mmol/L   TCO2 19 0 - 100 mmol/L   Hemoglobin 8.5 (L) 13.0 - 17.0 g/dL   HCT 25.0  (L) 39.0 - 52.0 %  I-STAT 3, arterial blood gas (G3+)     Status: Abnormal   Collection Time: 02/12/14  6:49 PM  Result Value Ref Range   pH, Arterial 7.275 (L) 7.350 - 7.450   pCO2 arterial 43.0 35.0 - 45.0 mmHg   pO2, Arterial 274.0 (H) 80.0 - 100.0 mmHg   Bicarbonate 19.9 (L) 20.0 - 24.0 mEq/L   TCO2 21 0 - 100 mmol/L   O2 Saturation 100.0 %   Acid-base deficit 6.0 (H) 0.0 - 2.0 mmol/L   Sample type ARTERIAL   Prepare Pheresed Platelets     Status: None   Collection Time: 02/12/14  7:18 PM  Result Value Ref Range   Unit Number [id number]    Blood Component Type PLTPHER LR2    Unit division 00    Status of Unit ISSUED,FINAL    Transfusion Status OK TO TRANSFUSE   Prepare fresh frozen plasma     Status: None   Collection Time: 02/12/14  7:19 PM  Result Value Ref Range   Unit Number [id number]    Blood Component Type THAWED PLASMA    Unit division 00    Status of Unit ISSUED,FINAL    Transfusion Status OK TO TRANSFUSE    Unit Number [id number]    Blood Component Type THW PLS APHR    Unit division A0    Status of Unit ISSUED,FINAL    Transfusion  Status OK TO TRANSFUSE   CBC     Status: Abnormal   Collection Time: 02/12/14  7:21 PM  Result Value Ref Range   WBC 24.8 (H) 4.0 - 10.5 K/uL   RBC 2.94 (L) 4.22 - 5.81 MIL/uL   Hemoglobin 7.9 (L) 13.0 - 17.0 g/dL   HCT 24.4 (L) 39.0 - 52.0 %   MCV 83.0 78.0 - 100.0 fL   MCH 26.9 26.0 - 34.0 pg   MCHC 32.4 30.0 - 36.0 g/dL   RDW 15.9 (H) 11.5 - 15.5 %   Platelets 114 (L) 150 - 400 K/uL    Comment: REPEATED TO VERIFY CONSISTENT WITH PREVIOUS RESULT   APTT     Status: Abnormal   Collection Time: 02/12/14  7:21 PM  Result Value Ref Range   aPTT 51 (H) 24 - 37 seconds    Comment:        IF BASELINE aPTT IS ELEVATED, SUGGEST PATIENT RISK ASSESSMENT BE USED TO DETERMINE APPROPRIATE ANTICOAGULANT THERAPY.   Protime-INR     Status: Abnormal   Collection Time: 02/12/14  7:21 PM  Result Value Ref Range   Prothrombin  Time 28.2 (H) 11.6 - 15.2 seconds   INR 2.61 (H) 0.00 - 1.49  Fibrinogen     Status: Abnormal   Collection Time: 02/12/14  7:21 PM  Result Value Ref Range   Fibrinogen 172 (L) 204 - 475 mg/dL  Prepare cryoprecipitate     Status: None   Collection Time: 02/12/14  8:00 PM  Result Value Ref Range   Unit Number [id number]    Blood Component Type CRYPOOL THAW    Unit division 00    Status of Unit ISSUED,FINAL    Transfusion Status OK TO TRANSFUSE    Unit Number [id number]    Blood Component Type CRYPOOL THAW    Unit division 00    Status of Unit ISSUED,FINAL    Transfusion Status OK TO TRANSFUSE   Prepare fresh frozen plasma     Status: None   Collection Time: 02/12/14  8:01 PM  Result Value Ref Range   Unit Number [id number]    Blood Component Type THAWED PLASMA    Unit division 00    Status of Unit ISSUED,FINAL    Transfusion Status OK TO TRANSFUSE    Unit Number [id number]    Blood Component Type THW PLS APHR    Unit division A0    Status of Unit ISSUED,FINAL    Transfusion Status OK TO TRANSFUSE   CBC     Status: Abnormal   Collection Time: 02/12/14  9:00 PM  Result Value Ref Range   WBC 17.1 (H) 4.0 - 10.5 K/uL   RBC 3.56 (L) 4.22 - 5.81 MIL/uL   Hemoglobin 9.9 (L) 13.0 - 17.0 g/dL    Comment: POST TRANSFUSION SPECIMEN   HCT 29.7 (L) 39.0 - 52.0 %   MCV 83.4 78.0 - 100.0 fL   MCH 27.8 26.0 - 34.0 pg   MCHC 33.3 30.0 - 36.0 g/dL   RDW 15.6 (H) 11.5 - 15.5 %   Platelets 103 (L) 150 - 400 K/uL    Comment: CONSISTENT WITH PREVIOUS RESULT  Protime-INR     Status: Abnormal   Collection Time: 02/12/14  9:00 PM  Result Value Ref Range   Prothrombin Time 19.2 (H) 11.6 - 15.2 seconds   INR 1.60 (H) 0.00 - 1.49  APTT     Status: Abnormal   Collection Time: 02/12/14  9:00 PM  Result Value Ref Range   aPTT 38 (H) 24 - 37 seconds    Comment:        IF BASELINE aPTT IS ELEVATED, SUGGEST PATIENT RISK ASSESSMENT BE USED TO DETERMINE APPROPRIATE ANTICOAGULANT  THERAPY.   I-STAT 4, (NA,K, GLUC, HGB,HCT)     Status: Abnormal   Collection Time: 02/12/14  9:21 PM  Result Value Ref Range   Sodium 143 135 - 145 mmol/L   Potassium 3.7 3.5 - 5.1 mmol/L   Glucose, Bld 104 (H) 70 - 99 mg/dL   HCT 28.0 (L) 39.0 - 52.0 %   Hemoglobin 9.5 (L) 13.0 - 17.0 g/dL  I-STAT 3, arterial blood gas (G3+)     Status: Abnormal   Collection Time: 02/12/14  9:21 PM  Result Value Ref Range   pH, Arterial 7.311 (L) 7.350 - 7.450   pCO2 arterial 51.8 (H) 35.0 - 45.0 mmHg   pO2, Arterial 54.0 (L) 80.0 - 100.0 mmHg   Bicarbonate 26.7 (H) 20.0 - 24.0 mEq/L   TCO2 28 0 - 100 mmol/L   O2 Saturation 87.0 %   Patient temperature 95.2 F    Collection site ARTERIAL LINE    Sample type ARTERIAL   Glucose, capillary     Status: None   Collection Time: 02/12/14 10:33 PM  Result Value Ref Range   Glucose-Capillary 98 70 - 99 mg/dL  I-STAT 3, arterial blood gas (G3+)     Status: Abnormal   Collection Time: 02/12/14 10:36 PM  Result Value Ref Range   pH, Arterial 7.351 7.350 - 7.450   pCO2 arterial 45.6 (H) 35.0 - 45.0 mmHg   pO2, Arterial 67.0 (L) 80.0 - 100.0 mmHg   Bicarbonate 25.6 (H) 20.0 - 24.0 mEq/L   TCO2 27 0 - 100 mmol/L   O2 Saturation 93.0 %   Acid-base deficit 1.0 0.0 - 2.0 mmol/L   Patient temperature 96.0 F    Collection site ARTERIAL LINE    Sample type ARTERIAL   Prepare fresh frozen plasma     Status: None (Preliminary result)   Collection Time: 02/12/14 11:30 PM  Result Value Ref Range   Unit Number [id number]    Blood Component Type THAWED PLASMA    Unit division 00    Status of Unit ISSUED    Transfusion Status OK TO TRANSFUSE    Unit Number [id number]    Blood Component Type THAWED PLASMA    Unit division 00    Status of Unit ISSUED,FINAL    Transfusion Status OK TO TRANSFUSE   Glucose, capillary     Status: Abnormal   Collection Time: 02/12/14 11:44 PM  Result Value Ref Range   Glucose-Capillary 100 (H) 70 - 99 mg/dL  APTT      Status: None   Collection Time: 02/13/14  3:29 AM  Result Value Ref Range   aPTT 30 24 - 37 seconds  Protime-INR     Status: Abnormal   Collection Time: 02/13/14  3:29 AM  Result Value Ref Range   Prothrombin Time 17.3 (H) 11.6 - 15.2 seconds   INR 1.40 0.00 - 1.49  CBC     Status: Abnormal   Collection Time: 02/13/14  3:33 AM  Result Value Ref Range   WBC 11.9 (H) 4.0 - 10.5 K/uL   RBC 3.40 (L) 4.22 - 5.81 MIL/uL   Hemoglobin 9.4 (L) 13.0 - 17.0 g/dL   HCT 28.1 (L) 39.0 - 52.0 %   MCV 82.6 78.0 - 100.0  fL   MCH 27.6 26.0 - 34.0 pg   MCHC 33.5 30.0 - 36.0 g/dL   RDW 15.7 (H) 11.5 - 15.5 %   Platelets 110 (L) 150 - 400 K/uL    Comment: CONSISTENT WITH PREVIOUS RESULT  Basic metabolic panel     Status: Abnormal   Collection Time: 02/13/14  3:33 AM  Result Value Ref Range   Sodium 142 135 - 145 mmol/L    Comment: Please note change in reference range.   Potassium 3.8 3.5 - 5.1 mmol/L    Comment: Please note change in reference range.   Chloride 109 96 - 112 mEq/L   CO2 27 19 - 32 mmol/L   Glucose, Bld 96 70 - 99 mg/dL   BUN 14 6 - 23 mg/dL   Creatinine, Ser 0.98 0.50 - 1.35 mg/dL   Calcium 7.2 (L) 8.4 - 10.5 mg/dL   GFR calc non Af Amer >90 >90 mL/min   GFR calc Af Amer >90 >90 mL/min    Comment: (NOTE) The eGFR has been calculated using the CKD EPI equation. This calculation has not been validated in all clinical situations. eGFR's persistently <90 mL/min signify possible Chronic Kidney Disease.    Anion gap 6 5 - 15  Magnesium     Status: Abnormal   Collection Time: 02/13/14  3:33 AM  Result Value Ref Range   Magnesium 2.6 (H) 1.5 - 2.5 mg/dL  I-STAT 3, arterial blood gas (G3+)     Status: Abnormal   Collection Time: 02/13/14  3:39 AM  Result Value Ref Range   pH, Arterial 7.437 7.350 - 7.450   pCO2 arterial 39.0 35.0 - 45.0 mmHg   pO2, Arterial 138.0 (H) 80.0 - 100.0 mmHg   Bicarbonate 26.5 (H) 20.0 - 24.0 mEq/L   TCO2 28 0 - 100 mmol/L   O2 Saturation 99.0 %     Acid-Base Excess 2.0 0.0 - 2.0 mmol/L   Patient temperature 97.2 F    Collection site ARTERIAL LINE    Sample type ARTERIAL   Glucose, capillary     Status: Abnormal   Collection Time: 02/13/14  8:18 AM  Result Value Ref Range   Glucose-Capillary 103 (H) 70 - 99 mg/dL   Comment 1 Notify RN       Component Value Date/Time   SDES ABSCESS 02/12/2014 1545   SPECREQUEST RIGHT ATRIAL FISTULA,PT ON ZINACEF 02/12/2014 1545   CULT  01/01/2014 1050    NO GROWTH 2 DAYS Performed at [facility]    REPTSTATUS 02/12/2014 FINAL 02/12/2014 1545   Ct Angio Chest Pe W/cm &/or Wo Cm  02/11/2014   CLINICAL DATA:  Shortness breath for 6 days, worse on exertion. Recent open heart surgery.  EXAM: CT ANGIOGRAPHY CHEST WITH CONTRAST  TECHNIQUE: Multidetector CT imaging of the chest was performed using the standard protocol during bolus administration of intravenous contrast. Multiplanar CT image reconstructions and MIPs were obtained to evaluate the vascular anatomy.  CONTRAST:  155m OMNIPAQUE IOHEXOL 350 MG/ML SOLN  COMPARISON:  None.  FINDINGS: Image quality is degraded by respiratory motion. No definite pulmonary embolus. No pathologically enlarged mediastinal, hilar or axillary lymph nodes. Heart is enlarged. No pericardial effusion.  Small bilateral pleural effusions. Again, image quality is degraded by respiratory motion. Mild collapse/consolidation in both lower lobes, right greater than left, with scattered volume loss in the right middle lobe and lingula. Airway is unremarkable.  Incidental imaging of the upper abdomen shows low attenuation throughout the visualized  portion of the liver. There is reflux of contrast into the IVC and hepatic veins. Tiny stones are seen in the gallbladder. Visualized portions of the adrenal glands, kidneys, spleen, pancreas, stomach and bowel are grossly unremarkable.  No worrisome lytic or sclerotic lesions.  Review of the MIP images confirms the above findings.   IMPRESSION: 1. Image quality is rather degraded by respiratory motion. No definite pulmonary embolus. 2. Small bilateral effusions with collapse/consolidation in both lower lobes, right greater than left. Pneumonia is considered. 3. Hepatic steatosis. 4. Cholelithiasis.   Electronically Signed   By: Lorin Picket M.D.   On: 02/11/2014 16:31   Dg Chest Port 1 View  02/13/2014   CLINICAL DATA:  Atelectasis.  EXAM: PORTABLE CHEST - 1 VIEW  COMPARISON:  02/12/2014.  FINDINGS: Support apparatus: Endotracheal tube, RIGHT IJ central line and RIGHT IJ vascular sheath appear unchanged. Mediastinal drains remain present. LEFT thoracostomy tube remains present. RIGHT thoracostomy tube present. Unchanged 2 lead LEFT subclavian AICD. Enteric tube unchanged. RIGHT upper extremity PICC unchanged.  Cardiomediastinal Silhouette: Enlarged, unchanged. Tricuspid annuloplasty ring. Median sternotomy.  Lungs: Worsening airspace disease in the RIGHT lung, probably representing asymmetric pulmonary edema. The base and RIGHT mid lung are opacified. No pneumothorax.  Effusions:  None visible.  Other:  None.  IMPRESSION: 1. Unchanged support apparatus. 2. Cardiomegaly. 3. Increasing RIGHT lung airspace disease within unchanged appearance of the LEFT lung. This probably represents asymmetric pulmonary edema rather than infection or aspiration.   Electronically Signed   By: Dereck Ligas M.D.   On: 02/13/2014 08:15   Dg Chest Port 1 View  02/12/2014   CLINICAL DATA:  Endotracheal tube placement.  Initial encounter.  EXAM: PORTABLE CHEST - 1 VIEW  COMPARISON:  Chest radiograph and CTA of the chest performed 02/11/2014  FINDINGS: The endotracheal tube is seen ending 1-2 cm above the carina. This could be retracted 1 cm, as deemed clinically appropriate.  A right PICC is noted ending about the mid to distal SVC. An enteric tube is seen extending below the diaphragm, and coiling about the stomach. Two mediastinal drains are noted. Two right  IJ lines are seen, ending about the proximal and mid SVC. Bilateral chest tubes are grossly unremarkable in appearance.  No definite pneumothorax is seen. Right basilar airspace opacification raises concern for pneumonia. The appearance is atypical for atelectasis, though it remains a possibility. No definite pleural effusion is seen.  The cardiomediastinal silhouette is mildly enlarged. A pacemaker is noted overlying the left chest wall, with leads ending overlying the right atrium and right ventricle. The patient is status post median sternotomy. No acute osseous abnormalities are seen.  IMPRESSION: 1. Endotracheal tube seen ending 1-2 cm above the carina. This could be retracted 1 cm, as deemed clinically appropriate. 2. Right basilar airspace opacification raises concern for pneumonia. The appearance is atypical for atelectasis, though it remains a possibility. 3. Mild cardiomegaly noted. Additional lines and tubes as described above.   Electronically Signed   By: Garald Balding M.D.   On: 02/12/2014 22:01   Recent Results (from the past 240 hour(s))  MRSA PCR Screening     Status: None   Collection Time: 02/11/14  8:40 PM  Result Value Ref Range Status   MRSA by PCR NEGATIVE NEGATIVE Final    Comment:        The GeneXpert MRSA Assay (FDA approved for NASAL specimens only), is one component of a comprehensive MRSA colonization surveillance program. It is not intended  to diagnose MRSA infection nor to guide or monitor treatment for MRSA infections.   Gram stain     Status: None   Collection Time: 02/12/14  3:45 PM  Result Value Ref Range Status   Specimen Description ABSCESS  Final   Special Requests RIGHT ATRIAL FISTULA,PT ON ZINACEF  Final   Gram Stain   Final    FEW WBC PRESENT,BOTH PMN AND MONONUCLEAR NO ORGANISMS SEEN    Report Status 02/12/2014 FINAL  Final      02/13/2014, 10:29 AM     LOS: 2 days

## 2014-02-13 NOTE — Progress Notes (Signed)
[facility] BRIEF SICU PROGRESS NOTE  1 Day Post-Op  S/P Procedure(s) (LRB): TRICUSPID VALVE REPAIR, REDO MEDIAN STERNOTOMY, REPAIR OF VENTRICULAR SEPTAL DEFECT (RECURRENT LV OUTFLOW TRACT TO RIGHT ATRIAL FISTULA), REDO CLOSURE OF PATENT FORAMEN OVALE (N/A)  Stable day Sinus w/ V-pacing Stable hemodynamics off milrinone and dopamine, on low dose Neo Oxygenation improved w/ O2 sats 98-100% now on 40% FiO2 Diuresing well  Plan: Wean vent to extubate.  Continue routine care  Anglea Gordner H 02/13/2014 6:48 PM

## 2014-02-13 NOTE — Procedures (Signed)
Extubation Procedure Note  Patient Details:   Name: [name] DOB: Sep 24, 1985 MRN: [id number]   Airway Documentation:  Airway 8 mm (Active)  Secured at (cm) 21 cm 02/13/2014  7:24 PM  Measured From Lips 02/13/2014  7:24 PM  Secured Location Center 02/13/2014  7:24 PM  Secured By [employer] 02/13/2014  7:24 PM  Tube Holder Repositioned Yes 02/13/2014  7:24 PM  Cuff Pressure (cm H2O) 25 cm H2O 02/12/2014  9:35 PM  Site Condition Dry 02/13/2014  7:24 PM    Evaluation  O2 sats: stable throughout Complications: No apparent complications Patient did tolerate procedure well. Bilateral Breath Sounds: Clear, Diminished   Yes  Extubated to 4L nasal cannula.  NIF:-70, VC- .,  IS- 400. Patient was stable throughout wean and procedure.  Was able to speak his name after the procedure.  RT will continue to monitor progress with RN.   Morley Kos B 02/13/2014, 9:17 PM

## 2014-02-13 NOTE — Progress Notes (Signed)
INITIAL NUTRITION ASSESSMENT  DOCUMENTATION CODES Per approved criteria  -Not Applicable   INTERVENTION: If unable to wean pt as expected: Initiate Vital AF 1.2 @ 20 ml/hr via OGT and increase by 10 ml every 4 hours to goal rate of  50 ml/hr.   Tube feeding regimen provides 1440 kcal (100% of needs), 108 grams of protein, and 1003  ml of H2O.    NUTRITION DIAGNOSIS: Inadequate oral itnake related to inability to eat as evidenced by NPO status.  Goal:  Enteral nutrition to provide 60-70% of estimated calorie needs (22-25 kcals/kg ideal body weight) and 100% of estimated protein needs, based on ASPEN guidelines for hypocaloric, high protein feeding in critically ill obese individuals  Monitor:  Respiratory status, nutrition support plan, weights, I/O's and labs  Reason for Assessment: mechanical ventilation  29 y.o. male  Admitting Dx: S/P ventricular septal defect repair  ASSESSMENT: Pt is 29 yo male who is s/p tricuspid valve repair, redo median sternotomy, repair of ventricular septal defect, redo closure of patent foramen ovale.  Patient is currently intubated (02/12/14) on ventilator support. Vent weaning held until oxygenation is better. Diuresis started. Nursing reports goal for weaning possible tomorrow. His appetite was very good prior to surgery 100% meal intake Heart Healthy diet.  MV:  11.2 L/min Temp (24hrs), Avg:97.2 F (36.2 C), Min:95.1 F (35.1 C), Max:98.8 F (37.1 C)  Propofol: 0 ml/hr   Abnormal labs: calcium and mag.  Height: Ht Readings from Last 1 Encounters:  02/11/14 5\' 5"  (1.651 m)    Weight: Wt Readings from Last 1 Encounters:  02/13/14 191 lb 5.8 oz (86.8 kg)    Ideal Body Weight: 136# (61.8 kg)  % Ideal Body Weight: 140 %  Wt Readings from Last 10 Encounters:  02/13/14 191 lb 5.8 oz (86.8 kg)  02/11/14 183 lb (83.008 kg)  02/02/14 183 lb (83.008 kg)  01/21/14 181 lb (82.101 kg)  01/07/14 192 lb 0.3 oz (87.1 kg)    Usual Body  Weight: 190-192#  % Usual Body Weight: 100%  BMI:  Body mass index is 31.84 kg/(m^2). obesity class I  Estimated Nutritional Needs: Kcal: 2020 Underfeeding goal: 1212-1550 Protein: 93-105 gr Fluid: >2000 ml daily (normal needs)  Skin: surgical incisions to chest, groin and neck  Diet Order: Diet NPO time specified  EDUCATION NEEDS: -No education needs identified at this time   Intake/Output Summary (Last 24 hours) at 02/13/14 1230 Last data filed at 02/13/14 1100  Gross per 24 hour  Intake 10850.09 ml  Output   7100 ml  Net 3750.09 ml    Last BM: 12/30   Labs:   Recent Labs Lab 02/11/14 1230 02/12/14 0300  02/12/14 1723 02/12/14 1844 02/12/14 2121 02/13/14 0333  NA 138 137  < > 138 140 143 142  K 3.8 3.6  < > 4.2 4.6 3.7 3.8  CL 102 104  < > 101 103  --  109  CO2 24 23  --   --   --   --  27  BUN 20 21  < > 19 19  --  14  CREATININE 1.18 1.14  < > 1.00 0.90  --  0.98  CALCIUM 8.7 8.5  --   --   --   --  7.2*  MG 1.7  --   --   --   --   --  2.6*  GLUCOSE 88 96  < > 113* 116* 104* 96  < > = values in  this interval not displayed.  CBG (last 3)   Recent Labs  02/12/14 2344 02/13/14 0818 02/13/14 1145  GLUCAP 100* 103* 105*    Scheduled Meds: . sodium chloride   Intravenous Once  . acetaminophen  1,000 mg Oral 4 times per day   Or  . acetaminophen (TYLENOL) oral liquid 160 mg/5 mL  1,000 mg Per Tube 4 times per day  . aspirin EC  325 mg Oral Daily   Or  . aspirin  324 mg Per Tube Daily  . bisacodyl  10 mg Oral Daily   Or  . bisacodyl  10 mg Rectal Daily  . cefTRIAXone (ROCEPHIN)  IV  2 g Intravenous Q12H  . cefUROXime (ZINACEF)  IV  1.5 g Intravenous Q12H  . docusate sodium  200 mg Oral Daily  . famotidine (PEPCID) IV  20 mg Intravenous Q12H  . furosemide  20 mg Intravenous 3 times per day  . insulin aspart  0-24 Units Subcutaneous 6 times per day  . [START ON 02/14/2014] pantoprazole  40 mg Oral Daily  . rifampin (RIFADIN) IVPB  300 mg  Intravenous Q12H  . sodium chloride  3 mL Intravenous Q12H    Continuous Infusions: . sodium chloride    . dexmedetomidine 0.7 mcg/kg/hr (02/13/14 1100)  . DOPamine Stopped (02/13/14 1000)  . milrinone 0.3 mcg/kg/min (02/13/14 1100)  . phenylephrine (NEO-SYNEPHRINE) Adult infusion 20 mcg/min (02/13/14 1100)    Past Medical History  Diagnosis Date  . Obesity   . Smokeless tobacco use   . Fracture of left femur   . Meningitis     Group B Strep (November 2015)  . Patent foramen ovale     Closed during procedure on December 30, 2013  . History of open heart surgery     December 30, 2013- ascending aortic root replacement, TEE  . Endocarditis     related to meningitis   . Brain abscess   . Presence of permanent cardiac pacemaker 01/04/2014    PPM MEDTRONIC FOR COMPLETE HEART BLOCK  . Thrombocytopenia 12/2013  . AKI (acute kidney injury)   . 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 Tricuspid regurgitation 02/11/2014  . 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 S/P ventricular septal defect repair + redo tricuspid valve repair + redo closure PFO 02/12/2014    Redo sternotomy for bovine pericardial patch repair of large ventricular septal defect (recurrent LVOT to RA fistula due to bacterial endocarditis)  . S/P redo tricuspid valve repair 02/12/2014    Complex valvuloplasty including patch closure of VSD with plication of septal leaflet and 26 mm Edwards mc3 ring annuloplasty  . S/P redo patent foramen ovale closure 02/12/2014  . S/P placement of cardiac pacemaker 01/04/2014    [employer]onic (serial number [id number]) dual chamber permanent pacemaker implanted by Dr Ladona Ridgel    Past Surgical History  Procedure Laterality Date  . Ascending aortic root replacement N/A 12/30/2013    Procedure:  ASCENDING AORTIC ROOT REPLACEMENT with 23mm HOMOGRAFT, CLOSURE OF AORTIC TO RIGHT ATRIAL FISTULA, CLOSURE OF PATENT FORAMEN OVALE;  Surgeon: Delight Ovens, MD;  Location: [facility] OR;  Service: Open Heart Surgery;  Laterality: N/A;  . Tricuspid valve replacement N/A 12/30/2013    Procedure: REPAIR OF TRICUSPID VALVE LEAFLET;  Surgeon: Delight Ovens, MD;  Location: [facility]  OR;  Service: Open Heart Surgery;  Laterality: N/A;  . Intraoperative transesophageal echocardiogram N/A 12/30/2013    Procedure: INTRAOPERATIVE TRANSESOPHAGEAL ECHOCARDIOGRAM;  Surgeon: Delight Ovens, MD;  Location: [facility] OR;  Service: Open Heart Surgery;  Laterality: N/A;  . Permanent pacemaker insertion N/A 01/04/2014    Procedure: PERMANENT PACEMAKER INSERTION;  Surgeon: Marinus Maw, MD;  Location: [facility] CATH LAB;  Service: Cardiovascular;  Laterality: N/A;  . Insert / replace / remove pacemaker  01/04/2014  . Right heart catheterization N/A 02/12/2014    Procedure: RIGHT HEART CATH;  Surgeon: Laurey Morale, MD;  Location: [facility] CATH LAB;  Service: Cardiovascular;  Laterality: N/A;    Royann Shivers MS,RD,CSG,LDN Office: [phone] Pager: [phone]

## 2014-02-14 ENCOUNTER — Inpatient Hospital Stay (HOSPITAL_COMMUNITY): Payer: BC Managed Care – PPO

## 2014-02-14 DIAGNOSIS — Z9889 Other specified postprocedural states: Secondary | ICD-10-CM

## 2014-02-14 DIAGNOSIS — R7989 Other specified abnormal findings of blood chemistry: Secondary | ICD-10-CM

## 2014-02-14 LAB — GLUCOSE, CAPILLARY
GLUCOSE-CAPILLARY: 117 mg/dL — AB (ref 70–99)
GLUCOSE-CAPILLARY: 105 mg/dL — AB (ref 70–99)
GLUCOSE-CAPILLARY: 94 mg/dL (ref 70–99)
Glucose-Capillary: 115 mg/dL — ABNORMAL HIGH (ref 70–99)
Glucose-Capillary: 146 mg/dL — ABNORMAL HIGH (ref 70–99)
Glucose-Capillary: 130 mg/dL — ABNORMAL HIGH (ref 70–99)

## 2014-02-14 LAB — CBC
HEMATOCRIT: 27.7 % — AB (ref 39.0–52.0)
Hemoglobin: 8.9 g/dL — ABNORMAL LOW (ref 13.0–17.0)
MCH: 26.7 pg (ref 26.0–34.0)
MCHC: 32.1 g/dL (ref 30.0–36.0)
MCV: 83.2 fL (ref 78.0–100.0)
Platelets: 126 10*3/uL — ABNORMAL LOW (ref 150–400)
RBC: 3.33 MIL/uL — AB (ref 4.22–5.81)
RDW: 17.1 % — AB (ref 11.5–15.5)
WBC: 15.2 10*3/uL — AB (ref 4.0–10.5)

## 2014-02-14 LAB — POCT I-STAT 3, ART BLOOD GAS (G3+)
Acid-Base Excess: 1 mmol/L (ref 0.0–2.0)
Bicarbonate: 26.2 mEq/L — ABNORMAL HIGH (ref 20.0–24.0)
O2 SAT: 93 %
PCO2 ART: 44.6 mmHg (ref 35.0–45.0)
PO2 ART: 68 mmHg — AB (ref 80.0–100.0)
TCO2: 28 mmol/L (ref 0–100)
pH, Arterial: 7.374 (ref 7.350–7.450)

## 2014-02-14 LAB — PREALBUMIN: Prealbumin: 11.6 mg/dL — ABNORMAL LOW (ref 17.0–34.0)

## 2014-02-14 LAB — PREPARE FRESH FROZEN PLASMA
Unit division: 0
Unit division: 0

## 2014-02-14 LAB — COMPREHENSIVE METABOLIC PANEL
ALT: 86 U/L — ABNORMAL HIGH (ref 0–53)
AST: 68 U/L — ABNORMAL HIGH (ref 0–37)
Albumin: 2.3 g/dL — ABNORMAL LOW (ref 3.5–5.2)
Alkaline Phosphatase: 65 U/L (ref 39–117)
Anion gap: 5 (ref 5–15)
BUN: 14 mg/dL (ref 6–23)
CALCIUM: 7.6 mg/dL — AB (ref 8.4–10.5)
CO2: 26 mmol/L (ref 19–32)
CREATININE: 0.96 mg/dL (ref 0.50–1.35)
Chloride: 107 mEq/L (ref 96–112)
GFR calc Af Amer: >90 mL/min (ref >90)
Glucose, Bld: 126 mg/dL — ABNORMAL HIGH (ref 70–99)
Potassium: 4.2 mmol/L (ref 3.5–5.1)
SODIUM: 138 mmol/L (ref 135–145)
TOTAL PROTEIN: 4.6 g/dL — AB (ref 6.0–8.3)
Total Bilirubin: 1.8 mg/dL — ABNORMAL HIGH (ref 0.3–1.2)

## 2014-02-14 MED ORDER — KETOROLAC TROMETHAMINE 15 MG/ML IJ SOLN
15.0000 mg | Freq: Four times a day (QID) | INTRAMUSCULAR | Status: AC
  Administered 2014-02-14 – 2014-02-15 (×5): 15 mg via INTRAVENOUS
  Filled 2014-02-14 (×5): qty 1

## 2014-02-14 MED ORDER — INSULIN ASPART 100 UNIT/ML ~~LOC~~ SOLN
0.0000 [IU] | SUBCUTANEOUS | Status: DC
  Administered 2014-02-14: 2 [IU] via SUBCUTANEOUS

## 2014-02-14 MED ORDER — MORPHINE SULFATE 2 MG/ML IJ SOLN
2.0000 mg | INTRAMUSCULAR | Status: DC | PRN
  Administered 2014-02-14 – 2014-02-16 (×8): 2 mg via INTRAVENOUS
  Filled 2014-02-14 (×8): qty 1

## 2014-02-14 MED ORDER — RIFAMPIN 300 MG PO CAPS
300.0000 mg | ORAL_CAPSULE | Freq: Two times a day (BID) | ORAL | Status: DC
  Administered 2014-02-14 – 2014-02-15 (×3): 300 mg via ORAL
  Filled 2014-02-14 (×4): qty 1

## 2014-02-14 NOTE — Addendum Note (Signed)
Addendum  created 02/14/14 0830 by Kipp Brood, MD   Modules edited: Notes Section   Notes Section:  File: [id number]; Pend: [id number]; Pend: [id number]; Pend: [id number]

## 2014-02-14 NOTE — Progress Notes (Signed)
Anesthesiology Follow-up:  Awake and alert sitting in chair, neuro intact, having some incisional pain, in good spirits.  VS: T-36.3 BP-112/47 HR- 81 (atrial sensed, V-paced) RR-24 O2 sat 97% on 3L [place] CVP- 17  H/H- 8.9/27.7 Platelet count- 126,000 K-4.2 BUN/Cr.- 14/0.96   ABG on 3L O2 PH- 7.37 PCO2- 44.6 PO2- 68.0  CXR- improved aeration in R. Lower lung field alveolar airspace disease, most likely due to assymetric pulmonary edema  Extubated last night at 20:40  Doing well POD #2 following redo sternotomy and repair of LV-RA fistula and Tricuspid valve repair as following previous  homograft AVR and repair of Aorta-RA fistula 12/30/13 for endocarditis. Stable post-op course so far.  Kipp Brood, MD

## 2014-02-14 NOTE — Progress Notes (Signed)
INFECTIOUS DISEASE PROGRESS NOTE  ID: [name] is a 29 y.o. 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    Dyspnea on exertion   S/P aortic root and valve allograft   Tricuspid regurgitation   Dyspnea   Ventricular septal defect   S/P redo tricuspid valve repair   S/P redo patent foramen ovale closure   S/P placement of cardiac pacemaker  Subjective: C/o back pain. No SOB Abtx:  Anti-infectives    Start     Dose/Rate Route Frequency Ordered Stop   02/13/14 1200  rifampin (RIFADIN) 300 mg in sodium chloride 0.9 % 100 mL IVPB     300 mg200 mL/hr over 30 Minutes Intravenous Every 12 hours 02/13/14 1054     02/13/14 0445  vancomycin (VANCOCIN) IVPB 1000 mg/200 mL premix     1,000 mg200 mL/hr over 60 Minutes Intravenous  Once 02/12/14 2147 02/13/14 0517   02/13/14 0045  cefUROXime (ZINACEF) 1.5 g in dextrose 5 % 50 mL IVPB     1.5 g100 mL/hr over 30 Minutes Intravenous Every 12 hours 02/12/14 2147 02/15/14 0044   02/12/14 1600  vancomycin (VANCOCIN) 1,000 mg in sodium chloride 0.9 % 1,000 mL irrigation      Irrigation To Surgery 02/12/14 1553 02/12/14 1640   02/12/14 1341  polymyxin B 500,000 Units, bacitracin 50,000 Units in sodium chloride irrigation 0.9 % 500 mL irrigation  Status:  Discontinued       As needed 02/12/14 1341 02/12/14 2055   02/12/14 1015  vancomycin (VANCOCIN) 1,250 mg in sodium chloride 0.9 % 250 mL IVPB  Status:  Discontinued     1,250 mg166.7 mL/hr over 90 Minutes Intravenous To Surgery 02/12/14 1011 02/12/14 2147   02/12/14 1015  cefUROXime (ZINACEF) 1.5 g in dextrose 5 % 50 mL IVPB     1.5 g100 mL/hr over 30 Minutes Intravenous To Surgery 02/12/14 1011 02/12/14 1818   02/12/14 1015   cefUROXime (ZINACEF) 750 mg in dextrose 5 % 50 mL IVPB  Status:  Discontinued     750 mg100 mL/hr over 30 Minutes Intravenous To Surgery 02/12/14 1009 02/12/14 2147   02/11/14 2000  cefTRIAXone (ROCEPHIN) 2 g in dextrose 5 % 50 mL IVPB - Premix     2 g100 mL/hr over 30 Minutes Intravenous Every 12 hours 02/11/14 1315 03/29/14 2359   02/11/14 1845  cefTRIAXone (ROCEPHIN) 1 g in dextrose 5 % 50 mL IVPB - Premix  Status:  Discontinued     1 g100 mL/hr over 30 Minutes Intravenous Every 24 hours 02/11/14 1840 02/11/14 1842      Medications:  Scheduled: . acetaminophen  1,000 mg Oral 4 times per day  . antiseptic oral rinse  7 mL Mouth Rinse BID  . aspirin EC  325 mg Oral Daily  . bisacodyl  10 mg Oral Daily   Or  . bisacodyl  10 mg Rectal Daily  . cefTRIAXone (ROCEPHIN)  IV  2 g Intravenous Q12H  . cefUROXime (ZINACEF)  IV  1.5 g Intravenous Q12H  . docusate sodium  200 mg Oral Daily  . furosemide  20 mg Intravenous 3 times per day  . insulin aspart  0-24 Units Subcutaneous 6 times per day  .  ketorolac  15 mg Intravenous 4 times per day  . pantoprazole  40 mg Oral Daily  . rifampin (RIFADIN) IVPB  300 mg Intravenous Q12H  . sodium chloride  10-40 mL Intracatheter Q12H  . sodium chloride  3 mL Intravenous Q12H    Objective: Vital signs in last 24 hours: Temp:  [97.3 F (36.3 C)-98.8 F (37.1 C)] 97.3 F (36.3 C) (01/03 0800) Pulse Rate:  [68-94] 94 (01/03 0830) Resp:  [9-32] 25 (01/03 0830) BP: (87-175)/(42-101) 150/80 mmHg (01/03 0830) SpO2:  [94 %-100 %] 98 % (01/03 0830) Arterial Line BP: (75-122)/(44-80) 97/48 mmHg (01/03 0830) FiO2 (%):  [40 %-50 %] 40 % (01/02 1953) Weight:  [89.6 kg (197 lb 8.5 oz)] 89.6 kg (197 lb 8.5 oz) (01/03 0500)   General appearance: alert, cooperative and mild distress Resp: clear to auscultation bilaterally Chest wall: midline wound dressed, clean.  Cardio: regular rate and rhythm GI: normal findings: bowel sounds normal and soft,  non-tender  Lab Results  Recent Labs  02/13/14 0333 02/13/14 1708 02/13/14 1743 02/14/14 0400  WBC 11.9* 14.1*  --  15.2*  HGB 9.4* 9.5* 9.9* 8.9*  HCT 28.1* 29.0* 29.0* 27.7*  NA 142  --  143 138  K 3.8  --  3.7 4.2  CL 109  --  106 107  CO2 27  --   --  26  BUN 14  --  12 14  CREATININE 0.98 0.99 0.80 0.96   Liver Panel  Recent Labs  02/11/14 1230 02/14/14 0400  PROT 5.7* 4.6*  ALBUMIN 3.2* 2.3*  AST 147* 68*  ALT 165* 86*  ALKPHOS 93 65  BILITOT 0.7 1.8*   Sedimentation Rate No results for input(s): ESRSEDRATE in the last 72 hours. C-Reactive Protein No results for input(s): CRP in the last 72 hours.  Microbiology: Recent Results (from the past 240 hour(s))  MRSA PCR Screening     Status: None   Collection Time: 02/11/14  8:40 PM  Result Value Ref Range Status   MRSA by PCR NEGATIVE NEGATIVE Final    Comment:        The GeneXpert MRSA Assay (FDA approved for NASAL specimens only), is one component of a comprehensive MRSA colonization surveillance program. It is not intended to diagnose MRSA infection nor to guide or monitor treatment for MRSA infections.   Gram stain     Status: None   Collection Time: 02/12/14  3:45 PM  Result Value Ref Range Status   Specimen Description ABSCESS  Final   Special Requests RIGHT ATRIAL FISTULA,PT ON ZINACEF  Final   Gram Stain   Final    FEW WBC PRESENT,BOTH PMN AND MONONUCLEAR NO ORGANISMS SEEN    Report Status 02/12/2014 FINAL  Final    Studies/Results: Dg Chest Port 1 View  02/14/2014   CLINICAL DATA:  Followup of congestive heart failure. Ascending aortic root replacement.  EXAM: PORTABLE CHEST - 1 VIEW  COMPARISON:  One day prior  FINDINGS: Extubation. Pacer. Prior median sternotomy. Right-sided PICC line is difficult to visualize centrally. There is also a right internal jugular line and a right internal jugular Cordis sheath. The internal jugular line likely terminates over the SVC. Mild to moderate right  hemidiaphragm elevation. Mediastinal drain. Cardiomegaly accentuated by AP portable technique. No definite pleural fluid. No pneumothorax. Improvement in patchy right-sided airspace disease. Bilateral chest tubes remain in place.  IMPRESSION: Improved right-sided aeration. Favor asymmetric alveolar pulmonary edema. Infection could look similar.  Bilateral chest tubes  in place, without pneumothorax.   Electronically Signed   By: Jeronimo Greaves M.D.   On: 02/14/2014 07:39   Dg Chest Port 1 View  02/13/2014   CLINICAL DATA:  Atelectasis.  EXAM: PORTABLE CHEST - 1 VIEW  COMPARISON:  02/12/2014.  FINDINGS: Support apparatus: Endotracheal tube, RIGHT IJ central line and RIGHT IJ vascular sheath appear unchanged. Mediastinal drains remain present. LEFT thoracostomy tube remains present. RIGHT thoracostomy tube present. Unchanged 2 lead LEFT subclavian AICD. Enteric tube unchanged. RIGHT upper extremity PICC unchanged.  Cardiomediastinal Silhouette: Enlarged, unchanged. Tricuspid annuloplasty ring. Median sternotomy.  Lungs: Worsening airspace disease in the RIGHT lung, probably representing asymmetric pulmonary edema. The base and RIGHT mid lung are opacified. No pneumothorax.  Effusions:  None visible.  Other:  None.  IMPRESSION: 1. Unchanged support apparatus. 2. Cardiomegaly. 3. Increasing RIGHT lung airspace disease within unchanged appearance of the LEFT lung. This probably represents asymmetric pulmonary edema rather than infection or aspiration.   Electronically Signed   By: Andreas Newport M.D.   On: 02/13/2014 08:15   Dg Chest Port 1 View  02/12/2014   CLINICAL DATA:  Endotracheal tube placement.  Initial encounter.  EXAM: PORTABLE CHEST - 1 VIEW  COMPARISON:  Chest radiograph and CTA of the chest performed 02/11/2014  FINDINGS: The endotracheal tube is seen ending 1-2 cm above the carina. This could be retracted 1 cm, as deemed clinically appropriate.  A right PICC is noted ending about the mid to distal SVC.  An enteric tube is seen extending below the diaphragm, and coiling about the stomach. Two mediastinal drains are noted. Two right IJ lines are seen, ending about the proximal and mid SVC. Bilateral chest tubes are grossly unremarkable in appearance.  No definite pneumothorax is seen. Right basilar airspace opacification raises concern for pneumonia. The appearance is atypical for atelectasis, though it remains a possibility. No definite pleural effusion is seen.  The cardiomediastinal silhouette is mildly enlarged. A pacemaker is noted overlying the left chest wall, with leads ending overlying the right atrium and right ventricle. The patient is status post median sternotomy. No acute osseous abnormalities are seen.  IMPRESSION: 1. Endotracheal tube seen ending 1-2 cm above the carina. This could be retracted 1 cm, as deemed clinically appropriate. 2. Right basilar airspace opacification raises concern for pneumonia. The appearance is atypical for atelectasis, though it remains a possibility. 3. Mild cardiomegaly noted. Additional lines and tubes as described above.   Electronically Signed   By: Roanna Raider M.D.   On: 02/12/2014 22:01     Assessment/Plan: Endocarditis, GBS 12/30/13 VSD repair 02-12-14 Elevated LFTs  Would follow his LFTs serially (more likely from VSD, congestion). Change rifampin to PO Await his operative Cx, stains. Path? Restart his day count from 02-12-14 and plan for at least 6 weeks of ceftriaxone  Total days of antibiotics: 54 Day 2 ceftriaxone/rifampin         Johny Sax Infectious Diseases (pager) [phone] [email or web address] 02/14/2014, 10:17 AM  LOS: 3 days

## 2014-02-14 NOTE — Progress Notes (Signed)
[facility] BRIEF SICU PROGRESS NOTE  2 Days Post-Op  S/P Procedure(s) (LRB): TRICUSPID VALVE REPAIR, REDO MEDIAN STERNOTOMY, REPAIR OF VENTRICULAR SEPTAL DEFECT (RECURRENT LV OUTFLOW TRACT TO RIGHT ATRIAL FISTULA), REDO CLOSURE OF PATENT FORAMEN OVALE (N/A)   Stable day Ambulated in SICU Reports that breathing feels "much better than it was prior to surgery" Rhythm and BP stable  Plan: Continue current plan  Tobby Fawcett H 02/14/2014 6:02 PM

## 2014-02-14 NOTE — Progress Notes (Signed)
[address]       [name] [address]             [phone]        CARDIOTHORACIC SURGERY PROGRESS NOTE   R2 Days Post-Op Procedure(s) (LRB): TRICUSPID VALVE REPAIR, REDO MEDIAN STERNOTOMY, REPAIR OF VENTRICULAR SEPTAL DEFECT (RECURRENT LV OUTFLOW TRACT TO RIGHT ATRIAL FISTULA), REDO CLOSURE OF PATENT FORAMEN OVALE (N/A)  Subjective: Looks good.  Extubated yesterday evening uneventfully.  Up in chair.  Some nausea and upper chest and back pain  Objective: Vital signs: BP Readings from Last 1 Encounters:  02/14/14 150/80   Pulse Readings from Last 1 Encounters:  02/14/14 94   Resp Readings from Last 1 Encounters:  02/14/14 25   Temp Readings from Last 1 Encounters:  02/14/14 97.3 F (36.3 C) Oral    Hemodynamics: CVP:  [9 mmHg-19 mmHg] 19 mmHg  Physical Exam:  Rhythm:   Sinus w/ V-pacing  Breath sounds: clear  Heart sounds:  RRR w/out murmur  Incisions:  Dressing dry, intact  Abdomen:  Soft, non-distended, non-tender  Extremities:  Warm, well-perfused  Chest tubes:  Thin serosanguinous drainage, output trending down    Intake/Output from previous day: 01/02 0701 - 01/03 0700 In: 2017.7 [P.O.:550; I.V.:837.7; NG/GT:30; IV Piggyback:600] Out: 3145 [Urine:2125; Emesis/NG output:250; Chest Tube:770] Intake/Output this shift: Total I/O In: 99.4 [I.V.:49.4; IV Piggyback:50] Out: 90 [Urine:40; Chest Tube:50]  Lab Results:  CBC: Recent Labs  02/13/14 1708 02/13/14 1743 02/14/14 0400  WBC 14.1*  --  15.2*  HGB 9.5* 9.9* 8.9*  HCT 29.0* 29.0* 27.7*  PLT 127*  --  126*    BMET:  Recent Labs  02/13/14 0333  02/13/14 1743 02/14/14 0400  NA 142  --  143 138  K 3.8  --  3.7 4.2  CL 109  --  106 107  CO2 27  --   --  26  GLUCOSE 96  --  118* 126*  BUN 14  --  12 14  CREATININE 0.98  < > 0.80 0.96  CALCIUM 7.2*  --   --  7.6*  < > = values in this interval not displayed.   CBG (last 3)   Recent Labs  02/13/14 2332  02/14/14 0356 02/14/14 0809  GLUCAP 117* 115* 146*    ABG    Component Value Date/Time   PHART 7.374 02/14/2014 0358   PCO2ART 44.6 02/14/2014 0358   PO2ART 68.0* 02/14/2014 0358   HCO3 26.2* 02/14/2014 0358   TCO2 28 02/14/2014 0358   ACIDBASEDEF 1.0 02/12/2014 2236   O2SAT 93.0 02/14/2014 0358    CXR: PORTABLE CHEST - 1 VIEW  COMPARISON: One day prior  FINDINGS: Extubation. Pacer. Prior median sternotomy. Right-sided PICC line is difficult to visualize centrally. There is also a right internal jugular line and a right internal jugular Cordis sheath. The internal jugular line likely terminates over the SVC. Mild to moderate right hemidiaphragm elevation. Mediastinal drain. Cardiomegaly accentuated by AP portable technique. No definite pleural fluid. No pneumothorax. Improvement in patchy right-sided airspace disease. Bilateral chest tubes remain in place.  IMPRESSION: Improved right-sided aeration. Favor asymmetric alveolar pulmonary edema. Infection could look similar.  Bilateral chest tubes in place, without pneumothorax.   Electronically Signed  By: Jeronimo Greaves M.D.  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

## 2014-02-15 ENCOUNTER — Encounter (HOSPITAL_COMMUNITY): Payer: Self-pay | Admitting: Cardiology

## 2014-02-15 ENCOUNTER — Inpatient Hospital Stay (HOSPITAL_COMMUNITY): Payer: BC Managed Care – PPO

## 2014-02-15 ENCOUNTER — Other Ambulatory Visit (HOSPITAL_COMMUNITY): Payer: BC Managed Care – PPO

## 2014-02-15 DIAGNOSIS — B951 Streptococcus, group B, as the cause of diseases classified elsewhere: Secondary | ICD-10-CM

## 2014-02-15 DIAGNOSIS — Q210 Ventricular septal defect: Secondary | ICD-10-CM

## 2014-02-15 DIAGNOSIS — R7881 Bacteremia: Secondary | ICD-10-CM

## 2014-02-15 DIAGNOSIS — G009 Bacterial meningitis, unspecified: Secondary | ICD-10-CM | POA: Insufficient documentation

## 2014-02-15 DIAGNOSIS — A491 Streptococcal infection, unspecified site: Secondary | ICD-10-CM | POA: Insufficient documentation

## 2014-02-15 DIAGNOSIS — G060 Intracranial abscess and granuloma: Secondary | ICD-10-CM

## 2014-02-15 DIAGNOSIS — J9621 Acute and chronic respiratory failure with hypoxia: Secondary | ICD-10-CM

## 2014-02-15 DIAGNOSIS — I76 Septic arterial embolism: Secondary | ICD-10-CM

## 2014-02-15 LAB — GLUCOSE, CAPILLARY
GLUCOSE-CAPILLARY: 90 mg/dL (ref 70–99)
Glucose-Capillary: 91 mg/dL (ref 70–99)

## 2014-02-15 LAB — CBC
HCT: 24.8 % — ABNORMAL LOW (ref 39.0–52.0)
Hemoglobin: 7.9 g/dL — ABNORMAL LOW (ref 13.0–17.0)
MCH: 26.7 pg (ref 26.0–34.0)
MCHC: 31.9 g/dL (ref 30.0–36.0)
MCV: 83.8 fL (ref 78.0–100.0)
PLATELETS: 120 10*3/uL — AB (ref 150–400)
RBC: 2.96 MIL/uL — ABNORMAL LOW (ref 4.22–5.81)
RDW: 17.7 % — ABNORMAL HIGH (ref 11.5–15.5)
WBC: 11.2 10*3/uL — ABNORMAL HIGH (ref 4.0–10.5)

## 2014-02-15 LAB — CULTURE, ROUTINE-ABSCESS: Culture: NO GROWTH

## 2014-02-15 LAB — BASIC METABOLIC PANEL
Anion gap: 9 (ref 5–15)
BUN: 18 mg/dL (ref 6–23)
CO2: 27 mmol/L (ref 19–32)
CREATININE: 1.22 mg/dL (ref 0.50–1.35)
Calcium: 7.9 mg/dL — ABNORMAL LOW (ref 8.4–10.5)
Chloride: 104 mEq/L (ref 96–112)
GFR, EST NON AFRICAN AMERICAN: 81 mL/min — AB (ref >90)
Glucose, Bld: 92 mg/dL (ref 70–99)
POTASSIUM: 3.3 mmol/L — AB (ref 3.5–5.1)
Sodium: 140 mmol/L (ref 135–145)

## 2014-02-15 MED ORDER — FUROSEMIDE 40 MG PO TABS
40.0000 mg | ORAL_TABLET | Freq: Every day | ORAL | Status: AC
  Administered 2014-02-16 – 2014-02-17 (×2): 40 mg via ORAL
  Filled 2014-02-15 (×2): qty 1

## 2014-02-15 MED ORDER — FUROSEMIDE 10 MG/ML IJ SOLN
20.0000 mg | Freq: Four times a day (QID) | INTRAMUSCULAR | Status: AC
  Administered 2014-02-15 (×3): 20 mg via INTRAVENOUS
  Filled 2014-02-15: qty 2

## 2014-02-15 MED ORDER — TRAMADOL HCL 50 MG PO TABS: 50.0000 mg | ORAL_TABLET | ORAL | Status: DC | PRN

## 2014-02-15 MED ORDER — MOVING RIGHT ALONG BOOK
Freq: Once | Status: AC
  Administered 2014-02-15: 08:00:00
  Filled 2014-02-15: qty 1

## 2014-02-15 MED ORDER — POTASSIUM CHLORIDE 10 MEQ/50ML IV SOLN
10.0000 meq | INTRAVENOUS | Status: AC
  Administered 2014-02-15 (×3): 10 meq via INTRAVENOUS
  Filled 2014-02-15 (×3): qty 50

## 2014-02-15 MED ORDER — SODIUM CHLORIDE 0.9 % IJ SOLN: 3.0000 mL | Freq: Two times a day (BID) | INTRAMUSCULAR | Status: DC

## 2014-02-15 MED ORDER — SODIUM CHLORIDE 0.9 % IJ SOLN: 3.0000 mL | INTRAMUSCULAR | Status: DC | PRN

## 2014-02-15 MED ORDER — GENTAMICIN IN SALINE 1.6-0.9 MG/ML-% IV SOLN
80.0000 mg | Freq: Two times a day (BID) | INTRAVENOUS | Status: DC
  Administered 2014-02-15 – 2014-02-18 (×6): 80 mg via INTRAVENOUS
  Filled 2014-02-15 (×8): qty 50

## 2014-02-15 MED ORDER — SODIUM CHLORIDE 0.9 % IV SOLN: 250.0000 mL | INTRAVENOUS | Status: DC | PRN

## 2014-02-15 MED ORDER — POTASSIUM CHLORIDE CRYS ER 20 MEQ PO TBCR
20.0000 meq | EXTENDED_RELEASE_TABLET | Freq: Every day | ORAL | Status: AC
Start: 2014-02-16 — End: 2014-02-17
  Administered 2014-02-16 – 2014-02-17 (×2): 20 meq via ORAL
  Filled 2014-02-15 (×2): qty 1

## 2014-02-15 MED ORDER — POTASSIUM CHLORIDE 10 MEQ/50ML IV SOLN
10.0000 meq | INTRAVENOUS | Status: AC
  Administered 2014-02-15 (×3): 10 meq via INTRAVENOUS
  Filled 2014-02-15 (×4): qty 50

## 2014-02-15 MED FILL — Mannitol IV Soln 20%: INTRAVENOUS | Qty: 500 | Status: AC

## 2014-02-15 MED FILL — Sodium Chloride IV Soln 0.9%: INTRAVENOUS | Qty: 3000 | Status: AC

## 2014-02-15 MED FILL — Lidocaine HCl IV Inj 20 MG/ML: INTRAVENOUS | Qty: 5 | Status: AC

## 2014-02-15 MED FILL — Electrolyte-R (PH 7.4) Solution: INTRAVENOUS | Qty: 5000 | Status: AC

## 2014-02-15 MED FILL — Heparin Sodium (Porcine) Inj 1000 Unit/ML: INTRAMUSCULAR | Qty: 30 | Status: AC

## 2014-02-15 MED FILL — Sodium Bicarbonate IV Soln 8.4%: INTRAVENOUS | Qty: 50 | Status: AC

## 2014-02-15 NOTE — Progress Notes (Signed)
[facility]    Subjective: No new complaints   Antibiotics:  Anti-infectives    Start     Dose/Rate Route Frequency Ordered Stop   02/14/14 1100  rifampin (RIFADIN) capsule 300 mg     300 mg Oral Every 12 hours 02/14/14 1031     02/13/14 1200  rifampin (RIFADIN) 300 mg in sodium chloride 0.9 % 100 mL IVPB  Status:  Discontinued     300 mg200 mL/hr over 30 Minutes Intravenous Every 12 hours 02/13/14 1054 02/14/14 1031   02/13/14 0445  vancomycin (VANCOCIN) IVPB 1000 mg/200 mL premix     1,000 mg200 mL/hr over 60 Minutes Intravenous  Once 02/12/14 2147 02/13/14 0517   02/13/14 0045  cefUROXime (ZINACEF) 1.5 g in dextrose 5 % 50 mL IVPB     1.5 g100 mL/hr over 30 Minutes Intravenous Every 12 hours 02/12/14 2147 02/14/14 1307   02/12/14 1600  vancomycin (VANCOCIN) 1,000 mg in sodium chloride 0.9 % 1,000 mL irrigation      Irrigation To Surgery 02/12/14 1553 02/12/14 1640   02/12/14 1341  polymyxin B 500,000 Units, bacitracin 50,000 Units in sodium chloride irrigation 0.9 % 500 mL irrigation  Status:  Discontinued       As needed 02/12/14 1341 02/12/14 2055   02/12/14 1015  vancomycin (VANCOCIN) 1,250 mg in sodium chloride 0.9 % 250 mL IVPB  Status:  Discontinued     1,250 mg166.7 mL/hr over 90 Minutes Intravenous To Surgery 02/12/14 1011 02/12/14 2147   02/12/14 1015  cefUROXime (ZINACEF) 1.5 g in dextrose 5 % 50 mL IVPB     1.5 g100 mL/hr over 30 Minutes Intravenous To Surgery 02/12/14 1011 02/12/14 1818   02/12/14 1015  cefUROXime (ZINACEF) 750 mg in dextrose 5 % 50 mL IVPB  Status:  Discontinued     750 mg100 mL/hr over 30 Minutes Intravenous To Surgery 02/12/14 1009 02/12/14 2147   02/11/14 2000  cefTRIAXone (ROCEPHIN) 2 g in dextrose 5 % 50 mL IVPB - Premix     2 g100 mL/hr over 30 Minutes Intravenous Every 12 hours 02/11/14 1315 03/29/14 2359   02/11/14 1845  cefTRIAXone (ROCEPHIN) 1 g in dextrose 5 % 50 mL IVPB - Premix  Status:  Discontinued     1  g100 mL/hr over 30 Minutes Intravenous Every 24 hours 02/11/14 1840 02/11/14 1842      Medications: Scheduled Meds: . acetaminophen  1,000 mg Oral 4 times per day  . antiseptic oral rinse  7 mL Mouth Rinse BID  . aspirin EC  325 mg Oral Daily  . bisacodyl  10 mg Oral Daily   Or  . bisacodyl  10 mg Rectal Daily  . cefTRIAXone (ROCEPHIN)  IV  2 g Intravenous Q12H  . docusate sodium  200 mg Oral Daily  . furosemide  20 mg Intravenous Q6H  . [START ON 02/16/2014] furosemide  40 mg Oral Daily  . pantoprazole  40 mg Oral Daily  . [START ON 02/16/2014] potassium chloride  20 mEq Oral Daily  . rifampin  300 mg Oral Q12H  . sodium chloride  10-40 mL Intracatheter Q12H  . sodium chloride  3 mL Intravenous Q12H  . sodium chloride  3 mL Intravenous Q12H   Continuous Infusions: . sodium chloride 20 mL/hr at 02/14/14 1800   PRN Meds:.sodium chloride, acetaminophen, metoprolol, morphine injection, ondansetron (ZOFRAN) IV, oxyCODONE, sodium chloride, sodium chloride, sodium chloride, traMADol    Objective: Weight change: -3.5 oz (-  0.1 kg)  Intake/Output Summary (Last 24 hours) at 02/15/14 1520 Last data filed at 02/15/14 1200  Gross per 24 hour  Intake   1030 ml  Output   2555 ml  Net  -1525 ml   Blood pressure 107/59, pulse 96, temperature 100.1 F (37.8 C), temperature source Axillary, resp. rate 20, height  (1.651 m), weight 197 lb 5 oz (89.5 kg), SpO2 100 %. Temp:  [97.4 F (36.3 C)-100.1 F (37.8 C)] 100.1 F (37.8 C) (01/04 1234) Pulse Rate:  [52-105] 96 (01/04 1229) Resp:  [12-35] 20 (01/04 1229) BP: (93-125)/(49-75) 107/59 mmHg (01/04 1229) SpO2:  [91 %-100 %] 100 % (01/04 1229) FiO2 (%):  [24 %] 24 % (01/04 0841) Weight:  [197 lb 5 oz (89.5 kg)] 197 lb 5 oz (89.5 kg) (01/04 0500)  Physical Exam: General: Alert and awake, oriented x3, not in any acute distress. HEENT: anicteric sclera, pupils reactive to light and accommodation, EOMI CVS regular rate, normal r,   Murmur at left sternal border Chest: diminished breath sounds at the bases bilaterally Abdomen: soft nontender, nondistended, normal bowel sounds, Skin: no rashes Neuro: nonfocal  CBC:  CBC Latest Ref Rng 02/15/2014 02/14/2014 02/13/2014  WBC 4.0 - 10.5 K/uL 11.2(H) 15.2(H) -  Hemoglobin 13.0 - 17.0 g/dL 7.9(L) 8.9(L) 9.9(L)  Hematocrit 39.0 - 52.0 % 24.8(L) 27.7(L) 29.0(L)  Platelets 150 - 400 K/uL 120(L) 126(L) -     BMET  Recent Labs  02/14/14 0400 02/15/14 0445  NA 138 140  K 4.2 3.3*  CL 107 104  CO2 26 27  GLUCOSE 126* 92  BUN 14 18  CREATININE 0.96 1.22  CALCIUM 7.6* 7.9*     Liver Panel   Recent Labs  02/14/14 0400  PROT 4.6*  ALBUMIN 2.3*  AST 68*  ALT 86*  ALKPHOS 65  BILITOT 1.8*       Sedimentation Rate No results for input(s): ESRSEDRATE in the last 72 hours. C-Reactive Protein No results for input(s): CRP in the last 72 hours.  Micro Results: Recent Results (from the past 720 hour(s))  Culture, blood (routine x 2)     Status: None (Preliminary result)   Collection Time: 02/11/14  7:55 PM  Result Value Ref Range Status   Specimen Description BLOOD RIGHT ARM  Final   Special Requests   Final    BOTTLES DRAWN AEROBIC AND ANAEROBIC 10CC AER,2CC ANA   Culture   Final           BLOOD CULTURE RECEIVED NO GROWTH TO DATE CULTURE WILL BE HELD FOR 5 DAYS BEFORE ISSUING A FINAL NEGATIVE REPORT Performed at [facility]    Report Status PENDING  Incomplete  Culture, blood (routine x 2)     Status: None (Preliminary result)   Collection Time: 02/11/14  8:00 PM  Result Value Ref Range Status   Specimen Description BLOOD RIGHT ARM  Final   Special Requests BOTTLES DRAWN AEROBIC ONLY 5CC  Final   Culture   Final           BLOOD CULTURE RECEIVED NO GROWTH TO DATE CULTURE WILL BE HELD FOR 5 DAYS BEFORE ISSUING A FINAL NEGATIVE REPORT Performed at [facility]    Report Status PENDING  Incomplete  MRSA PCR Screening     Status: None    Collection Time: 02/11/14  8:40 PM  Result Value Ref Range Status   MRSA by PCR NEGATIVE NEGATIVE Final    Comment:  The GeneXpert MRSA Assay (FDA approved for NASAL specimens only), is one component of a comprehensive MRSA colonization surveillance program. It is not intended to diagnose MRSA infection nor to guide or monitor treatment for MRSA infections.   Tissue culture     Status: None (Preliminary result)   Collection Time: 02/12/14  3:31 PM  Result Value Ref Range Status   Specimen Description TISSUE  Final   Special Requests   Final    LV OUTFLOW TRACT RIGHT ATRIAL FISTULA PATIENT ON FOLLOWING ZINACEF   Gram Stain   Final    NO WBC SEEN NO ORGANISMS SEEN Performed at [facility]    Culture   Final    NO GROWTH 2 DAYS Performed at [facility]    Report Status PENDING  Incomplete  Gram stain     Status: None   Collection Time: 02/12/14  3:45 PM  Result Value Ref Range Status   Specimen Description ABSCESS  Final   Special Requests RIGHT ATRIAL FISTULA,PT ON ZINACEF  Final   Gram Stain   Final    FEW WBC PRESENT,BOTH PMN AND MONONUCLEAR NO ORGANISMS SEEN    Report Status 02/12/2014 FINAL  Final  Culture, routine-abscess     Status: None   Collection Time: 02/12/14  3:45 PM  Result Value Ref Range Status   Specimen Description ABSCESS  Final   Special Requests RIGHT ATRIAL FISTULA,PT ON ZINACEF  Final   Gram Stain   Final    FEW WBC PRESENT,BOTH PMN AND MONONUCLEAR NO SQUAMOUS EPITHELIAL CELLS SEEN NO ORGANISMS SEEN Performed at [facility] Performed at [facility]    Culture   Final    NO GROWTH 3 DAYS Performed at [facility]    Report Status 02/15/2014 FINAL  Final  Anaerobic culture     Status: None (Preliminary result)   Collection Time: 02/12/14  3:45 PM  Result Value Ref Range Status   Specimen Description ABSCESS  Final   Special Requests RIGHT ATRIAL FISTULA,PT ON ZINACEF  Final   Gram Stain    Final    FEW WBC PRESENT,BOTH PMN AND MONONUCLEAR NO SQUAMOUS EPITHELIAL CELLS SEEN NO ORGANISMS SEEN Performed at [facility] Performed at [facility]    Culture   Final    NO ANAEROBES ISOLATED; CULTURE IN PROGRESS FOR 5 DAYS Performed at [facility]    Report Status PENDING  Incomplete    Studies/Results: Dg Chest Port 1 View  02/15/2014   CLINICAL DATA:  Congestive heart failure, history of repair valvular heart disease and VSD and patent foramen ovale  EXAM: PORTABLE CHEST - 1 VIEW  COMPARISON:  Portable chest x-ray of February 14, 2014  FINDINGS: The lungs are slightly better inflated today. Confluent interstitial density persists in the right lung. The left lung is clear. The cardiac silhouette remains enlarged. The pulmonary vascularity is less engorged. There is no pneumothorax or significant pleural effusion.  Two large caliber chest tubes are present and unchanged in position bilaterally. A mediastinal drain is present projecting at approximately the T6 level. A right internal jugular Cordis sheath tip projects over the distal right internal jugular vein. An additional right internal jugular venous catheter tip projects the level of the midportion of the SVC. The right-sided PICC line tip also projects over the midportion of the SVC. There is a venous catheter in place via the femoral approach whose tip projects in the region of the right ventricular apex.  IMPRESSION: Further interval improvement in the appearance of the interstitial edema in the right lung. The left lung is clear. Stable enlargement of the cardiac silhouette with only minimal pulmonary vascular engorgement. The support tubes and lines are in position as described.   Electronically Signed   By: David  [place]   On: 02/15/2014 08:02   Dg Chest Port 1 View  02/14/2014   CLINICAL DATA:  Followup of congestive heart failure. Ascending aortic root replacement.  EXAM: PORTABLE CHEST - 1 VIEW  COMPARISON:   One day prior  FINDINGS: Extubation. Pacer. Prior median sternotomy. Right-sided PICC line is difficult to visualize centrally. There is also a right internal jugular line and a right internal jugular Cordis sheath. The internal jugular line likely terminates over the SVC. Mild to moderate right hemidiaphragm elevation. Mediastinal drain. Cardiomegaly accentuated by AP portable technique. No definite pleural fluid. No pneumothorax. Improvement in patchy right-sided airspace disease. Bilateral chest tubes remain in place.  IMPRESSION: Improved right-sided aeration. Favor asymmetric alveolar pulmonary edema. Infection could look similar.  Bilateral chest tubes in place, without pneumothorax.   Electronically Signed   By: Jeronimo Greaves M.D.   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    Dyspnea on exertion   S/P aortic root and valve allograft   Tricuspid regurgitation   Dyspnea   Ventricular septal defect   S/P redo tricuspid valve repair   S/P redo patent foramen ovale closure   S/P placement of cardiac pacemaker    [name] is a 29 y.o. 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  Intraoperative cultures have not yielded any organism on antibiotics.  #1  Recurrent left ventricular outflow  fracture 2 right atrial fistula , ventricular septal defect [place] PFO originally sp CT surgery in November and sp greater than 6 weeks rocephin synergistic gentamicin, also with brain abscesses and meningitis  --I will see if we can again send path tissue to [facility] in [place] for PCR testing --would repeat 6 weeks of high dose rocephin --will restart gentamicni and try to give it for the first two weeks of such therapy --dc rifampin  Brain abscesses not visible on CT head (cant do MRI due to PM)     LOS: 4 days   [name] 02/15/2014, 3:20 PM

## 2014-02-15 NOTE — Progress Notes (Signed)
    Subjective:  Denies CP or dyspnea   Objective:  Filed Vitals:   02/15/14 0400 02/15/14 0500 02/15/14 0600 02/15/14 0700  BP: 110/62 102/68 125/75 109/61  Pulse: 88 88 101 95  Temp:      TempSrc:      Resp: 17 25 27 22   Height:      Weight:  197 lb 5 oz (89.5 kg)    SpO2: 99% 100% 100% 99%    Intake/Output from previous day:  Intake/Output Summary (Last 24 hours) at 02/15/14 0752 Last data filed at 02/15/14 0700  Gross per 24 hour  Intake 1419.4 ml  Output   1635 ml  Net -215.6 ml    Physical Exam: Physical exam: Well-developed well-nourished in no acute distress.  Skin is warm and dry.  HEENT is normal.  Neck is supple.  Chest diminished BS bases Cardiovascular exam is regular rate and rhythm. 1/6 systolic murmur LSB  Abdominal exam nontender or distended. No masses palpated. Extremities show no edema. neuro grossly intact    Lab Results: Basic Metabolic Panel:  Recent Labs  12/07/83 0333 02/13/14 1708  02/14/14 0400 02/15/14 0445  NA 142  --   < > 138 140  K 3.8  --   < > 4.2 3.3*  CL 109  --   < > 107 104  CO2 27  --   --  26 27  GLUCOSE 96  --   < > 126* 92  BUN 14  --   < > 14 18  CREATININE 0.98 0.99  < > 0.96 1.22  CALCIUM 7.2*  --   --  7.6* 7.9*  MG 2.6* 2.0  --   --   --   < > = values in this interval not displayed. CBC:  Recent Labs  02/14/14 0400 02/15/14 0445  WBC 15.2* 11.2*  HGB 8.9* 7.9*  HCT 27.7* 24.8*  MCV 83.2 83.8  PLT 126* 120*    Assessment/Plan:  1 s/p tricuspid valve repair, VSD repair (LVOT to right atrial fistula), closure of PFO-doing well postop 2 s/p pacemaker for CHB 3 recent group B strep endocarditis-agree with continued antibiotics for additional six weeks; ID following. 4 s/p aortic root and valve allograft 5 postoperative volume excess-continue diuresis  Olga Millers 02/15/2014, 7:52 AM

## 2014-02-15 NOTE — Progress Notes (Signed)
Report called to [facility], 2W-RN. Pt to transfer to 2W-23 via ambulation, family and belongings at bedside. No complaints of pain. Meds in chart, VS stable at time of transfer. Bedside handoff to Nego,RN. Kenzly Rogoff L

## 2014-02-15 NOTE — Progress Notes (Addendum)
      [address]       [name] [address]             [phone]        CARDIOTHORACIC SURGERY PROGRESS NOTE   R3 Days Post-Op Procedure(s) (LRB): TRICUSPID VALVE REPAIR, REDO MEDIAN STERNOTOMY, REPAIR OF VENTRICULAR SEPTAL DEFECT (RECURRENT LV OUTFLOW TRACT TO RIGHT ATRIAL FISTULA), REDO CLOSURE OF PATENT FORAMEN OVALE (N/A)  Subjective: Feels okay.  Starting to feel hungry.  Still dyspneic with activity but breathing much improved overall.  Objective: Vital signs: BP Readings from Last 1 Encounters:  02/15/14 109/61   Pulse Readings from Last 1 Encounters:  02/15/14 95   Resp Readings from Last 1 Encounters:  02/15/14 22   Temp Readings from Last 1 Encounters:  02/14/14 97.4 F (36.3 C) Oral    Hemodynamics: CVP:  [13 mmHg-21 mmHg] 13 mmHg  Physical Exam:  Rhythm:   Sinus w/ V-pacing  Breath sounds: clear  Heart sounds:  RRR w/out murmur  Incisions:  Dressing dry, intact  Abdomen:  Soft, non-distended, non-tender  Extremities:  Warm, well-perfused  Chest tubes:  Thin serous output primarily from pleural tubes   Intake/Output from previous day: 01/03 0701 - 01/04 0700 In: 1469.4 [P.O.:780; I.V.:539.4; IV Piggyback:150] Out: 1635 [Urine:995; Chest Tube:640] Intake/Output this shift:    Lab Results:  CBC: Recent Labs  02/14/14 0400 02/15/14 0445  WBC 15.2* 11.2*  HGB 8.9* 7.9*  HCT 27.7* 24.8*  PLT 126* 120*    BMET:  Recent Labs  02/14/14 0400 02/15/14 0445  NA 138 140  K 4.2 3.3*  CL 107 104  CO2 26 27  GLUCOSE 126* 92  BUN 14 18  CREATININE 0.96 1.22  CALCIUM 7.6* 7.9*     CBG (last 3)   Recent Labs  02/14/14 1956 02/14/14 2324 02/15/14 0456  GLUCAP 105* 94 90    ABG    Component Value Date/Time   PHART 7.374 02/14/2014 0358   PCO2ART 44.6 02/14/2014 0358   PO2ART 68.0* 02/14/2014 0358   HCO3 26.2* 02/14/2014 0358   TCO2 28 02/14/2014 0358   ACIDBASEDEF 1.0 02/12/2014 2236   O2SAT 93.0 02/14/2014  0358    CXR: Looks good w/ further improvement in asymmetric pulm edema R>L and mild bibasilar atelectasis  Assessment/Plan: S/P Procedure(s) (LRB): TRICUSPID VALVE REPAIR, REDO MEDIAN STERNOTOMY, REPAIR OF VENTRICULAR SEPTAL DEFECT (RECURRENT LV OUTFLOW TRACT TO RIGHT ATRIAL FISTULA), REDO CLOSURE OF PATENT FORAMEN OVALE (N/A)  Doing well POD3 Maintaining stable rhythm and hemodynamics off all drips, CVP low (13) and no murmur on exam Expected post op acute blood loss anemia, Hgb down to 7.9 today but no signs of ongoing blood loss Expected post op volume excess, diuresing modestly H/O Group B Strep bacterial endocarditis on IV Rocephin + Rifampin 3rd degree AV block s/p permanent pacemaker (Medtronic)   D/C mediastinal tubes but leave pleural tubes in for now  Mobilize  Diuresis  Watch Hgb for now, start iron when po intake improves  Continue antibiotics at least another 6 weeks  Transfer step down  Liz Claiborne 02/15/2014 7:39 AM

## 2014-02-15 NOTE — Telephone Encounter (Signed)
He was admitted with big PFO and had more surgery. I just saw him this am

## 2014-02-15 NOTE — Progress Notes (Signed)
1405 Offered to walk with pt again. He stated he walked over and is tired now. Wants to rest. Will follow up tomorrow. Encouraged pt to walk with RN later today. Saw pt on last admission. Luetta Nutting RN BSN 02/15/2014 2:06 PM

## 2014-02-15 NOTE — Progress Notes (Addendum)
ANTIBIOTIC CONSULT NOTE - INITIAL  Pharmacy Consult for Gentamicin Indication: endocarditis with Group B Streptococcus  No Known Allergies  Patient Measurements: Height:  (165.1 cm) Weight: 197 lb 5 oz (89.5 kg) IBW/kg (Calculated) : 61.5  Vital Signs: Temp: 100.1 F (37.8 C) (01/04 1234) Temp Source: Axillary (01/04 1234) BP: 107/59 mmHg (01/04 1229) Pulse Rate: 96 (01/04 1229) Intake/Output from previous day: 01/03 0701 - 01/04 0700 In: 1469.4 [P.O.:780; I.V.:539.4; IV Piggyback:150] Out: 1635 [Urine:995; Chest Tube:640] Intake/Output from this shift: Total I/O In: 330 [I.V.:80; IV Piggyback:250] Out: 1800 [Urine:1580; Chest Tube:220]  Labs:  Recent Labs  02/13/14 1708 02/13/14 1743 02/14/14 0400 02/15/14 0445  WBC 14.1*  --  15.2* 11.2*  HGB 9.5* 9.9* 8.9* 7.9*  PLT 127*  --  126* 120*  CREATININE 0.99 0.80 0.96 1.22   Estimated Creatinine Clearance: 94.4 mL/min (by C-G formula based on Cr of 1.22).  Microbiology: Recent Results (from the past 720 hour(s))  Culture, blood (routine x 2)     Status: None (Preliminary result)   Collection Time: 02/11/14  7:55 PM  Result Value Ref Range Status   Specimen Description BLOOD RIGHT ARM  Final   Special Requests   Final    BOTTLES DRAWN AEROBIC AND ANAEROBIC 10CC AER,2CC ANA   Culture   Final           BLOOD CULTURE RECEIVED NO GROWTH TO DATE CULTURE WILL BE HELD FOR 5 DAYS BEFORE ISSUING A FINAL NEGATIVE REPORT Performed at [facility]    Report Status PENDING  Incomplete  Culture, blood (routine x 2)     Status: None (Preliminary result)   Collection Time: 02/11/14  8:00 PM  Result Value Ref Range Status   Specimen Description BLOOD RIGHT ARM  Final   Special Requests BOTTLES DRAWN AEROBIC ONLY 5CC  Final   Culture   Final           BLOOD CULTURE RECEIVED NO GROWTH TO DATE CULTURE WILL BE HELD FOR 5 DAYS BEFORE ISSUING A FINAL NEGATIVE REPORT Performed at [facility]    Report Status  PENDING  Incomplete  MRSA PCR Screening     Status: None   Collection Time: 02/11/14  8:40 PM  Result Value Ref Range Status   MRSA by PCR NEGATIVE NEGATIVE Final    Comment:        The GeneXpert MRSA Assay (FDA approved for NASAL specimens only), is one component of a comprehensive MRSA colonization surveillance program. It is not intended to diagnose MRSA infection nor to guide or monitor treatment for MRSA infections.   Tissue culture     Status: None (Preliminary result)   Collection Time: 02/12/14  3:31 PM  Result Value Ref Range Status   Specimen Description TISSUE  Final   Special Requests   Final    LV OUTFLOW TRACT RIGHT ATRIAL FISTULA PATIENT ON FOLLOWING ZINACEF   Gram Stain   Final    NO WBC SEEN NO ORGANISMS SEEN Performed at [facility]    Culture   Final    NO GROWTH 2 DAYS Performed at [facility]    Report Status PENDING  Incomplete  Gram stain     Status: None   Collection Time: 02/12/14  3:45 PM  Result Value Ref Range Status   Specimen Description ABSCESS  Final   Special Requests RIGHT ATRIAL FISTULA,PT ON ZINACEF  Final   Gram Stain   Final  FEW WBC PRESENT,BOTH PMN AND MONONUCLEAR NO ORGANISMS SEEN    Report Status 02/12/2014 FINAL  Final  Culture, routine-abscess     Status: None   Collection Time: 02/12/14  3:45 PM  Result Value Ref Range Status   Specimen Description ABSCESS  Final   Special Requests RIGHT ATRIAL FISTULA,PT ON ZINACEF  Final   Gram Stain   Final    FEW WBC PRESENT,BOTH PMN AND MONONUCLEAR NO SQUAMOUS EPITHELIAL CELLS SEEN NO ORGANISMS SEEN Performed at [facility] Performed at [facility]    Culture   Final    NO GROWTH 3 DAYS Performed at [facility]    Report Status 02/15/2014 FINAL  Final  Anaerobic culture     Status: None (Preliminary result)   Collection Time: 02/12/14  3:45 PM  Result Value Ref Range Status   Specimen Description ABSCESS  Final   Special  Requests RIGHT ATRIAL FISTULA,PT ON ZINACEF  Final   Gram Stain   Final    FEW WBC PRESENT,BOTH PMN AND MONONUCLEAR NO SQUAMOUS EPITHELIAL CELLS SEEN NO ORGANISMS SEEN Performed at [facility] Performed at [facility]    Culture   Final    NO ANAEROBES ISOLATED; CULTURE IN PROGRESS FOR 5 DAYS Performed at [facility]    Report Status PENDING  Incomplete    Medical History: Past Medical History  Diagnosis Date  . Obesity   . Smokeless tobacco use   . Fracture of left femur   . Meningitis     Group B Strep (November 2015)  . Patent foramen ovale     Closed during procedure on December 30, 2013  . History of open heart surgery     December 30, 2013- ascending aortic root replacement, TEE  . Endocarditis     related to meningitis   . Brain abscess   . Presence of permanent cardiac pacemaker 01/04/2014    PPM MEDTRONIC FOR COMPLETE HEART BLOCK  . Thrombocytopenia 12/2013  . AKI (acute kidney injury)   . 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 Tricuspid regurgitation 02/11/2014  . 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 S/P ventricular septal defect repair + redo tricuspid valve repair + redo closure PFO 02/12/2014    Redo sternotomy for bovine pericardial patch repair of large ventricular septal defect (recurrent LVOT to RA fistula due to bacterial endocarditis)  . S/P redo tricuspid valve repair 02/12/2014    Complex valvuloplasty including patch closure of VSD with plication of septal leaflet and 26 mm Edwards mc3 ring annuloplasty  . S/P redo patent foramen ovale closure 02/12/2014  . S/P placement of cardiac pacemaker 01/04/2014    Medtronic (serial number [id number]) dual chamber permanent pacemaker implanted by Dr Ladona Ridgel   Assessment:  POD # 3 re-do  sternotomy for tricuspid valve repair, re-do PFO closure and VSD repair.  Day # 5 Ceftriaxone 2 grams IV q12hrs. Planning 6 weeks of Ceftriaxone. Gentamicin to be added for 14 days for synergy.   Was on Gentamicin in 12/2013 with prior Group B Streptococcus bacteremia with endocarditis of aortic valve, meningitis and septic emboli to CNS/brain abscesses.  Gent trough level on 80 mg IV q8hrs was 1.2 mcg/ml when Scr 0.98.   Scr trended up to 1.22 today.  Goal of Therapy:  Gentamicin peak 3-4 mcg/ml, trough of <2 mcg/ml  Plan:    Gentamicin 80 mg IV q12hrs.   Will plan to check gentamicin trough level at steady state.   Follow renal function.  Dennie Fetters, [place] Pager: [phone] 02/15/2014,4:27 PM

## 2014-02-16 ENCOUNTER — Inpatient Hospital Stay (HOSPITAL_COMMUNITY): Payer: BC Managed Care – PPO

## 2014-02-16 LAB — BASIC METABOLIC PANEL
Anion gap: 10 (ref 5–15)
BUN: 10 mg/dL (ref 6–23)
CO2: 28 mmol/L (ref 19–32)
Calcium: 7.8 mg/dL — ABNORMAL LOW (ref 8.4–10.5)
Chloride: 99 mEq/L (ref 96–112)
Creatinine, Ser: 0.85 mg/dL (ref 0.50–1.35)
GFR calc Af Amer: >90 mL/min (ref >90)
Glucose, Bld: 82 mg/dL (ref 70–99)
Potassium: 3.7 mmol/L (ref 3.5–5.1)
SODIUM: 137 mmol/L (ref 135–145)

## 2014-02-16 LAB — TISSUE CULTURE
CULTURE: NO GROWTH
GRAM STAIN: NONE SEEN

## 2014-02-16 LAB — CBC
HEMATOCRIT: 26.6 % — AB (ref 39.0–52.0)
Hemoglobin: 8.7 g/dL — ABNORMAL LOW (ref 13.0–17.0)
MCH: 28 pg (ref 26.0–34.0)
MCHC: 32.7 g/dL (ref 30.0–36.0)
MCV: 85.5 fL (ref 78.0–100.0)
Platelets: 146 10*3/uL — ABNORMAL LOW (ref 150–400)
RBC: 3.11 MIL/uL — ABNORMAL LOW (ref 4.22–5.81)
RDW: 17.9 % — ABNORMAL HIGH (ref 11.5–15.5)
WBC: 9.7 10*3/uL (ref 4.0–10.5)

## 2014-02-16 MED ORDER — BOOST / RESOURCE BREEZE PO LIQD
1.0000 | Freq: Three times a day (TID) | ORAL | Status: DC
  Administered 2014-02-16 – 2014-02-17 (×2): 1 via ORAL

## 2014-02-16 MED ORDER — OXYCODONE-ACETAMINOPHEN 5-325 MG PO TABS
1.0000 | ORAL_TABLET | ORAL | Status: DC | PRN
  Administered 2014-02-16: 1 via ORAL
  Filled 2014-02-16: qty 1

## 2014-02-16 MED ORDER — KETOROLAC TROMETHAMINE 15 MG/ML IJ SOLN
15.0000 mg | Freq: Four times a day (QID) | INTRAMUSCULAR | Status: AC
  Administered 2014-02-16: 15 mg via INTRAVENOUS
  Filled 2014-02-16: qty 1

## 2014-02-16 MED FILL — Dexmedetomidine HCl in NaCl 0.9% IV Soln 400 MCG/100ML: INTRAVENOUS | Qty: 100 | Status: AC

## 2014-02-16 MED FILL — Heparin Sodium (Porcine) Inj 1000 Unit/ML: INTRAMUSCULAR | Qty: 30 | Status: AC

## 2014-02-16 MED FILL — Potassium Chloride Inj 2 mEq/ML: INTRAVENOUS | Qty: 40 | Status: AC

## 2014-02-16 MED FILL — Magnesium Sulfate Inj 50%: INTRAMUSCULAR | Qty: 10 | Status: AC

## 2014-02-16 NOTE — Progress Notes (Signed)
EPW removed, vitals stable, pt tolerated well. Darrel Hoover

## 2014-02-16 NOTE — Progress Notes (Signed)
Both chest tubes removed per order, pt tolerated well. Darrel Hoover

## 2014-02-16 NOTE — Progress Notes (Signed)
Utilization review completed.  

## 2014-02-16 NOTE — Progress Notes (Signed)
NUTRITION FOLLOW-UP  INTERVENTION: Resource Breeze po TID, each supplement provides 250 kcal and 9 grams of protein  NUTRITION DIAGNOSIS: Inadequate oral itnake related to recent surgery as evidenced by meal completion <50%.  Goal:  Enteral nutrition to provide 60-70% of estimated calorie needs (22-25 kcals/kg ideal body weight) and 100% of estimated protein needs, based on ASPEN guidelines for hypocaloric, high protein feeding in critically ill obese individuals, NA  New Goal:  Pt to meet >/= 90% of their estimated nutrition needs   Monitor:  PO intake, supplement acceptance, weights, I/O's and labs  ASSESSMENT: Pt is 29 yo male who is s/p tricuspid valve repair, redo median sternotomy, repair of ventricular septal defect, redo closure of patent foramen ovale.   Pain control being addressed.  Pt with chest tube in place with 486 ml out. Plan to keep in today.  Pt sleeping, girlfriend at bedside. Per girlfriend pt had heart surgery 6 weeks ago and had not been eating well since. Pt ate 1/2 of his sandwich and a few chips for his lunch today.  Per girlfriend they were making protein shakes at home and pt was drinking naked juices, they had recently started doing this due to his poor appetite.  Girlfriend thinks pt would prefer Breeze.   Height: Ht Readings from Last 1 Encounters:  02/11/14  (1.651 m)    Weight: Wt Readings from Last 1 Encounters:  02/16/14 186 lb 8 oz (84.596 kg)  Admission weight: 177 lb (80.5 kg) 12/31  BMI:  Body mass index is 31.04 kg/(m^2). obesity class I  Estimated Nutritional Needs: Kcal: 2200-2400 Protein: 100-120 grams Fluid: >2.200 ml daily   Skin: surgical incisions to chest, groin and neck  Diet Order: Diet heart healthy/carb modified Meal Completion: 75% on clear liquids   Intake/Output Summary (Last 24 hours) at 02/16/14 1313 Last data filed at 02/16/14 0621  Gross per 24 hour  Intake    240 ml  Output   3721 ml  Net  -3481 ml     Last BM: 1/4   Labs:   Recent Labs Lab 02/11/14 1230  02/13/14 0333 02/13/14 1708  02/14/14 0400 02/15/14 0445 02/16/14 0535  NA 138  < > 142  --   < > 138 140 137  K 3.8  < > 3.8  --   < > 4.2 3.3* 3.7  CL 102  < > 109  --   < > 107 104 99  CO2 24  < > 27  --   --  BUN 20  < > 14  --   < > CREATININE 1.18  < > 0.98 0.99  < > 0.96 1.22 0.85  CALCIUM 8.7  < > 7.2*  --   --  7.6* 7.9* 7.8*  MG 1.7  --  2.6* 2.0  --   --   --   --   GLUCOSE 88  < > 96  --   < > 126* 92 82  < > = values in this interval not displayed.  CBG (last 3)   Recent Labs  02/14/14 2324 02/15/14 0456 02/15/14 0807  GLUCAP 94 90 91    Scheduled Meds: . acetaminophen  1,000 mg Oral 4 times per day  . antiseptic oral rinse  7 mL Mouth Rinse BID  . aspirin EC  325 mg Oral Daily  . bisacodyl  10 mg Oral Daily   Or  . bisacodyl  10 mg Rectal Daily  . cefTRIAXone (ROCEPHIN)  IV  2 g Intravenous Q12H  . docusate sodium  200 mg Oral Daily  . furosemide  40 mg Oral Daily  . gentamicin  80 mg Intravenous Q12H  . ketorolac  15 mg Intravenous 4 times per day  . pantoprazole  40 mg Oral Daily  . potassium chloride  20 mEq Oral Daily  . sodium chloride  10-40 mL Intracatheter Q12H  . sodium chloride  3 mL Intravenous Q12H  . sodium chloride  3 mL Intravenous Q12H    Continuous Infusions: . sodium chloride 10 mL/hr (02/15/14 2010)   Kendell Bane RD, LDN, CNSC [phone] Pager [phone] After Hours Pager

## 2014-02-16 NOTE — Progress Notes (Addendum)
[facility]    Subjective: No new complaints   Antibiotics:  Anti-infectives    Start     Dose/Rate Route Frequency Ordered Stop   02/15/14 1700  gentamicin (GARAMYCIN) IVPB 80 mg     80 mg100 mL/hr over 30 Minutes Intravenous Every 12 hours 02/15/14 1555     02/14/14 1100  rifampin (RIFADIN) capsule 300 mg  Status:  Discontinued     300 mg Oral Every 12 hours 02/14/14 1031 02/15/14 1529   02/13/14 1200  rifampin (RIFADIN) 300 mg in sodium chloride 0.9 % 100 mL IVPB  Status:  Discontinued     300 mg200 mL/hr over 30 Minutes Intravenous Every 12 hours 02/13/14 1054 02/14/14 1031   02/13/14 0445  vancomycin (VANCOCIN) IVPB 1000 mg/200 mL premix     1,000 mg200 mL/hr over 60 Minutes Intravenous  Once 02/12/14 2147 02/13/14 0517   02/13/14 0045  cefUROXime (ZINACEF) 1.5 g in dextrose 5 % 50 mL IVPB     1.5 g100 mL/hr over 30 Minutes Intravenous Every 12 hours 02/12/14 2147 02/14/14 1307   02/12/14 1600  vancomycin (VANCOCIN) 1,000 mg in sodium chloride 0.9 % 1,000 mL irrigation      Irrigation To Surgery 02/12/14 1553 02/12/14 1640   02/12/14 1341  polymyxin B 500,000 Units, bacitracin 50,000 Units in sodium chloride irrigation 0.9 % 500 mL irrigation  Status:  Discontinued       As needed 02/12/14 1341 02/12/14 2055   02/12/14 1015  vancomycin (VANCOCIN) 1,250 mg in sodium chloride 0.9 % 250 mL IVPB  Status:  Discontinued     1,250 mg166.7 mL/hr over 90 Minutes Intravenous To Surgery 02/12/14 1011 02/12/14 2147   02/12/14 1015  cefUROXime (ZINACEF) 1.5 g in dextrose 5 % 50 mL IVPB     1.5 g100 mL/hr over 30 Minutes Intravenous To Surgery 02/12/14 1011 02/12/14 1818   02/12/14 1015  cefUROXime (ZINACEF) 750 mg in dextrose 5 % 50 mL IVPB  Status:  Discontinued     750 mg100 mL/hr over 30 Minutes Intravenous To Surgery 02/12/14 1009 02/12/14 2147   02/11/14 2000  cefTRIAXone (ROCEPHIN) 2 g in dextrose 5 % 50 mL IVPB - Premix     2 g100 mL/hr over 30 Minutes  Intravenous Every 12 hours 02/11/14 1315 03/29/14 2359   02/11/14 1845  cefTRIAXone (ROCEPHIN) 1 g in dextrose 5 % 50 mL IVPB - Premix  Status:  Discontinued     1 g100 mL/hr over 30 Minutes Intravenous Every 24 hours 02/11/14 1840 02/11/14 1842      Medications: Scheduled Meds: . acetaminophen  1,000 mg Oral 4 times per day  . antiseptic oral rinse  7 mL Mouth Rinse BID  . aspirin EC  325 mg Oral Daily  . bisacodyl  10 mg Oral Daily   Or  . bisacodyl  10 mg Rectal Daily  . cefTRIAXone (ROCEPHIN)  IV  2 g Intravenous Q12H  . docusate sodium  200 mg Oral Daily  . furosemide  40 mg Oral Daily  . gentamicin  80 mg Intravenous Q12H  . ketorolac  15 mg Intravenous 4 times per day  . pantoprazole  40 mg Oral Daily  . potassium chloride  20 mEq Oral Daily  . sodium chloride  10-40 mL Intracatheter Q12H  . sodium chloride  3 mL Intravenous Q12H  . sodium chloride  3 mL Intravenous Q12H   Continuous Infusions: . sodium chloride 10 mL/hr (02/15/14 2010)  PRN Meds:.sodium chloride, acetaminophen, metoprolol, morphine injection, ondansetron (ZOFRAN) IV, oxyCODONE-acetaminophen, sodium chloride, sodium chloride, sodium chloride, traMADol    Objective: Weight change: -10 lb 13 oz (-4.904 kg)  Intake/Output Summary (Last 24 hours) at 02/16/14 1311 Last data filed at 02/16/14 0621  Gross per 24 hour  Intake    240 ml  Output   3721 ml  Net  -3481 ml   Blood pressure 110/69, pulse 97, temperature 98.5 F (36.9 C), temperature source Oral, resp. rate 19, height 5\' 5"  (1.651 m), weight 186 lb 8 oz (84.596 kg), SpO2 90 %. Temp:  [98.1 F (36.7 C)-98.5 F (36.9 C)] 98.5 F (36.9 C) (01/05 0429) Pulse Rate:  [96-108] 97 (01/05 0945) Resp:  [19-21] 19 (01/05 0429) BP: (98-128)/(65-73) 110/69 mmHg (01/05 0945) SpO2:  [90 %-100 %] 90 % (01/05 0429) Weight:  [186 lb 8 oz (84.596 kg)] 186 lb 8 oz (84.596 kg) (01/05 0429)  Physical Exam: General: Alert and awake, oriented x3, not in any  acute distress. HEENT: anicteric sclera, pupils reactive to light and accommodation, EOMI CVS regular rate, normal r,  Murmur at left sternal border Chest: diminished breath sounds at the bases bilaterally Abdomen: soft nontender, nondistended, normal bowel sounds, Skin: no rashes Neuro: nonfocal  CBC:  CBC Latest Ref Rng 02/16/2014 02/15/2014 02/14/2014  WBC 4.0 - 10.5 K/uL 9.7 11.2(H) 15.2(H)  Hemoglobin 13.0 - 17.0 g/dL 8.6(L) 7.9(L) 8.9(L)  Hematocrit 39.0 - 52.0 % 26.6(L) 24.8(L) 27.7(L)  Platelets 150 - 400 K/uL 146(L) 120(L) 126(L)     BMET  Recent Labs  02/15/14 0445 02/16/14 0535  NA 140 137  K 3.3* 3.7  CL 104 99  CO2 27 28  GLUCOSE 92 82  BUN 18 10  CREATININE 1.22 0.85  CALCIUM 7.9* 7.8*     Liver Panel   Recent Labs  02/14/14 0400  PROT 4.6*  ALBUMIN 2.3*  AST 68*  ALT 86*  ALKPHOS 65  BILITOT 1.8*       Sedimentation Rate No results for input(s): ESRSEDRATE in the last 72 hours. C-Reactive Protein No results for input(s): CRP in the last 72 hours.  Micro Results: Recent Results (from the past 720 hour(s))  Culture, blood (routine x 2)     Status: None (Preliminary result)   Collection Time: 02/11/14  7:55 PM  Result Value Ref Range Status   Specimen Description BLOOD RIGHT ARM  Final   Special Requests   Final    BOTTLES DRAWN AEROBIC AND ANAEROBIC 10CC AER,2CC ANA   Culture   Final           BLOOD CULTURE RECEIVED NO GROWTH TO DATE CULTURE WILL BE HELD FOR 5 DAYS BEFORE ISSUING A FINAL NEGATIVE REPORT Performed at [facility]    Report Status PENDING  Incomplete  Culture, blood (routine x 2)     Status: None (Preliminary result)   Collection Time: 02/11/14  8:00 PM  Result Value Ref Range Status   Specimen Description BLOOD RIGHT ARM  Final   Special Requests BOTTLES DRAWN AEROBIC ONLY 5CC  Final   Culture   Final           BLOOD CULTURE RECEIVED NO GROWTH TO DATE CULTURE WILL BE HELD FOR 5 DAYS BEFORE ISSUING A FINAL  NEGATIVE REPORT Performed at [facility]    Report Status PENDING  Incomplete  MRSA PCR Screening     Status: None   Collection Time: 02/11/14  8:40 PM  Result  Value Ref Range Status   MRSA by PCR NEGATIVE NEGATIVE Final    Comment:        The GeneXpert MRSA Assay (FDA approved for NASAL specimens only), is one component of a comprehensive MRSA colonization surveillance program. It is not intended to diagnose MRSA infection nor to guide or monitor treatment for MRSA infections.   Tissue culture     Status: None   Collection Time: 02/12/14  3:31 PM  Result Value Ref Range Status   Specimen Description TISSUE  Final   Special Requests   Final    LV OUTFLOW TRACT RIGHT ATRIAL FISTULA PATIENT ON FOLLOWING ZINACEF   Gram Stain   Final    NO WBC SEEN NO ORGANISMS SEEN Performed at [facility]    Culture   Final    NO GROWTH 3 DAYS Performed at [facility]    Report Status 02/16/2014 FINAL  Final  Gram stain     Status: None   Collection Time: 02/12/14  3:45 PM  Result Value Ref Range Status   Specimen Description ABSCESS  Final   Special Requests RIGHT ATRIAL FISTULA,PT ON ZINACEF  Final   Gram Stain   Final    FEW WBC PRESENT,BOTH PMN AND MONONUCLEAR NO ORGANISMS SEEN    Report Status 02/12/2014 FINAL  Final  Culture, routine-abscess     Status: None   Collection Time: 02/12/14  3:45 PM  Result Value Ref Range Status   Specimen Description ABSCESS  Final   Special Requests RIGHT ATRIAL FISTULA,PT ON ZINACEF  Final   Gram Stain   Final    FEW WBC PRESENT,BOTH PMN AND MONONUCLEAR NO SQUAMOUS EPITHELIAL CELLS SEEN NO ORGANISMS SEEN Performed at [facility] Performed at [facility]    Culture   Final    NO GROWTH 3 DAYS Performed at [facility]    Report Status 02/15/2014 FINAL  Final  Anaerobic culture     Status: None (Preliminary result)   Collection Time: 02/12/14  3:45 PM  Result Value Ref Range Status    Specimen Description ABSCESS  Final   Special Requests RIGHT ATRIAL FISTULA,PT ON ZINACEF  Final   Gram Stain   Final    FEW WBC PRESENT,BOTH PMN AND MONONUCLEAR NO SQUAMOUS EPITHELIAL CELLS SEEN NO ORGANISMS SEEN Performed at [facility] Performed at [facility]    Culture   Final    NO ANAEROBES ISOLATED; CULTURE IN PROGRESS FOR 5 DAYS Performed at [facility]    Report Status PENDING  Incomplete    Studies/Results: Dg Chest Port 1 View  02/16/2014   CLINICAL DATA:  Status post tricuspid valve replacement. On February 12, 2014  EXAM: PORTABLE CHEST - 1 VIEW  COMPARISON:  Portable chest x-ray of February 15, 2014  FINDINGS: The lung volumes remain low. Bilateral chest tubes are unchanged. A mediastinal drain has been removed. The PICC line tip projects over the midportion of the SVC. The right internal jugular Cordis sheath and a smaller caliber right internal jugular catheter have been removed. The permanent pacemaker is unchanged in position. The cardiac silhouette remains enlarged. The pulmonary vascularity remains engorged but is slightly less conspicuous today on the right. The left hemidiaphragm is not as well seen which may reflect developing atelectasis or small pleural effusion.  IMPRESSION: There has been further slight interval improvement in the appearance of the pulmonary interstitium predominantly on the right. There remains mild interstitial edema bilaterally. Minimal  basilar atelectasis and trace pleural effusion on the left have developed since yesterday's study.   Electronically Signed   By: David  [place]   On: 02/16/2014 08:13   Dg Chest Port 1 View  02/15/2014   CLINICAL DATA:  Congestive heart failure, history of repair valvular heart disease and VSD and patent foramen ovale  EXAM: PORTABLE CHEST - 1 VIEW  COMPARISON:  Portable chest x-ray of February 14, 2014  FINDINGS: The lungs are slightly better inflated today. Confluent interstitial density persists  in the right lung. The left lung is clear. The cardiac silhouette remains enlarged. The pulmonary vascularity is less engorged. There is no pneumothorax or significant pleural effusion.  Two large caliber chest tubes are present and unchanged in position bilaterally. A mediastinal drain is present projecting at approximately the T6 level. A right internal jugular Cordis sheath tip projects over the distal right internal jugular vein. An additional right internal jugular venous catheter tip projects the level of the midportion of the SVC. The right-sided PICC line tip also projects over the midportion of the SVC. There is a venous catheter in place via the femoral approach whose tip projects in the region of the right ventricular apex.  IMPRESSION: Further interval improvement in the appearance of the interstitial edema in the right lung. The left lung is clear. Stable enlargement of the cardiac silhouette with only minimal pulmonary vascular engorgement. The support tubes and lines are in position as described.   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    Dyspnea on exertion   S/P aortic root and valve allograft   Tricuspid regurgitation   Dyspnea   Ventricular septal defect   S/P redo tricuspid valve repair   S/P redo patent foramen ovale closure   S/P placement of cardiac pacemaker   Group B streptococcal infection   Bacterial meningitis    [name] is a 29 y.o. 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  Intraoperative cultures have not yielded any organism on antibiotics.  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 , ventricular septal defect [place] PFO originally sp CT surgery in November and sp greater than 6 weeks rocephin synergistic gentamicin, also with brain abscesses and meningitis  --I will see if we can again send path tissue to [facility] in [place] for PCR testing (note I checked with pathology and have no tissue there will see if microbiology has some tissue that can be sent) --would repeat 6 weeks of high dose rocephin and two weeks of IV gentamicin   Brain abscesses not visible on CT head (cant do MRI due to PM)  When he is dc to home he should ensure proper hydration and have his BMP w GFR  checked BIWEEKLY, CBC + diff weekly, gentamicin levels  per pharmacy at home health co. After two weeks of gent, then swithc to weekly cbc plus diff and bmp and all of these should be faxed to me Dr. Daiva Eves @ [phone]  I will arrange for [facility] in my office  Please call with further questions.      LOS: 5 days   Acey Lav 02/16/2014, 1:11 PM

## 2014-02-16 NOTE — Progress Notes (Addendum)
[address]       [place] [address]             [phone]      4 Days Post-Op Procedure(s) (LRB): TRICUSPID VALVE REPAIR, REDO MEDIAN STERNOTOMY, REPAIR OF VENTRICULAR SEPTAL DEFECT (RECURRENT LV OUTFLOW TRACT TO RIGHT ATRIAL FISTULA), REDO CLOSURE OF PATENT FORAMEN OVALE (N/A)   Subjective:  Mr. [name] complains of pain this morning.  States the oral medications do not provide relief and the IV Morphine is not lasting very long.  He states his breathing has also improved.   He is ambulating with minimal assistance.  No BM  Objective: Vital signs in last 24 hours: Temp:  [97.4 F (36.3 C)-100.1 F (37.8 C)] 98.5 F (36.9 C) (01/05 0429) Pulse Rate:  [52-105] 105 (01/05 0429) Cardiac Rhythm:  [-] Ventricular paced (01/04 2001) Resp:  [15-35] 19 (01/05 0429) BP: (98-114)/(59-72) 98/65 mmHg (01/05 0429) SpO2:  [90 %-100 %] 90 % (01/05 0429) FiO2 (%):  [24 %] 24 % (01/04 0841) Weight:  [186 lb 8 oz (84.596 kg)] 186 lb 8 oz (84.596 kg) (01/05 0429)  Intake/Output from previous day: 01/04 0701 - 01/05 0700 In: 570 [P.O.:240; I.V.:80; IV Piggyback:250] Out: 2130 [QMVHQ:4696; Chest Tube:486]  General appearance: alert, cooperative and no distress Heart: regular rate and rhythm and paced, no murmur present Lungs: clear to auscultation bilaterally Abdomen: soft, non-tender; bowel sounds normal; no masses,  no organomegaly Extremities: edema trace Wound: clean and dry  Lab Results:  Recent Labs  02/15/14 0445 02/16/14 0535  WBC 11.2* 9.7  HGB 7.9* 8.7*  HCT 24.8* 26.6*  PLT 120* 146*   BMET:  Recent Labs  02/15/14 0445 02/16/14 0535  NA 140 137  K 3.3* 3.7  CL 104 99  CO2 27 28  GLUCOSE 92 82  BUN 18 10  CREATININE 1.22 0.85  CALCIUM 7.9* 7.8*    PT/INR: No results for input(s): LABPROT, INR in the last 72 hours. ABG    Component Value Date/Time   PHART 7.374 02/14/2014 0358   HCO3 26.2* 02/14/2014 0358   TCO2 28 02/14/2014 0358   ACIDBASEDEF 1.0 02/12/2014 2236   O2SAT 93.0 02/14/2014 0358   CBG (last 3)   Recent Labs  02/14/14 2324 02/15/14 0456 02/15/14 0807  GLUCAP 94 90 91    Assessment/Plan: S/P Procedure(s) (LRB): TRICUSPID VALVE REPAIR, REDO MEDIAN STERNOTOMY, REPAIR OF VENTRICULAR SEPTAL DEFECT (RECURRENT LV OUTFLOW TRACT TO RIGHT ATRIAL FISTULA), REDO CLOSURE OF PATENT FORAMEN OVALE (N/A)  1. CV- Tachy, labile blood pressure- not on any blood pressure agents 2. Pulm- chest tube in place 486 output recorded, dyspnea improved, leave chest tube in place today 3. Renal- creatinine has returned to WNL at 0.85, remains hypervolemic continue Lasix 4. Expected Acute Blood Loss Anemia- hgb improved 8.7 5. Endocarditis- continue Rocephin and Gentamycin 6. Pain control- patient not getting much relief with Oxy or IV Morphine- may benefit from Toradol will discuss with Dr. Cornelius Moras, will d/c OXY and try Percocet, if still no relief could try Fentanyl 7. Dispo- patient improving, continue ABX per ID, will adjust pain medication and discuss use of Toradol with Dr. Cornelius Moras   LOS: 5 days    BARRETT, Denny Peon 02/16/2014  I have seen and examined the patient and agree with the assessment and plan as outlined.  D/C chest tubes and pacing wires.  Hopefully this will help w/ pain issues.  Will continue toradol a few more doses.  BP and HR will come down w/ improved pain control.  OWEN,CLARENCE H 02/16/2014 8:18 AM

## 2014-02-16 NOTE — Progress Notes (Signed)
CARDIAC REHAB PHASE I   PRE:  Rate/Rhythm: 99 v pacing    BP: sitting 103/60    SaO2: 94 RA  MODE:  Ambulation: 550 ft   POST:  Rate/Rhythm: 129 pacing    BP: sitting 121/71     SaO2: 90 RA  Had to wake pt. Moving well, no assist needed. HR up to 129 pacing by end of walk. SaO2 borderline at 90 RA. Encouraged pt to use IS more and walk at least x1 more. Pt sts he feels like he can breathe after this surgery. Sts he was always SOB after first surgery. [id number]   [name] CES, ACSM 02/16/2014 3:26 PM

## 2014-02-17 ENCOUNTER — Inpatient Hospital Stay (HOSPITAL_COMMUNITY): Payer: BC Managed Care – PPO

## 2014-02-17 LAB — GENTAMICIN LEVEL, TROUGH: Gentamicin Trough: 0.6 ug/mL (ref 0.5–2.0)

## 2014-02-17 LAB — ANAEROBIC CULTURE

## 2014-02-17 NOTE — Progress Notes (Signed)
Patient ambulated about 1200 ft on room air HR increases up to 120s per minute during the walk,no complaints of SOB,saturation post ambulation is 92% on room air.

## 2014-02-17 NOTE — Discharge Summary (Signed)
Physician Discharge Summary  Patient ID: [name] MRN: [id number] DOB/AGE: [birth year] 29 y.o.  Admit date: 02/11/2014 Discharge date: 02/17/2014  Admission Diagnoses:  Patient Active Problem List   Diagnosis Date Noted  . Group B streptococcal infection   . Bacterial meningitis   . Ventricular septal defect 02/12/2014  . S/P ventricular septal defect repair + redo tricuspid valve repair + redo closure PFO 02/12/2014  . S/P redo tricuspid valve repair 02/12/2014  . S/P redo patent foramen ovale closure 02/12/2014  . Dyspnea   . Pacemaker-medtronic.  02/11/2014  . Dyspnea on exertion 02/11/2014  . Tricuspid regurgitation 02/11/2014  . Acute CHF 01/04/2014  . S/P placement of cardiac pacemaker 01/04/2014  . Hypoxia   . Sepsis   . Third degree heart block 12/31/2013  . Endocarditis of aortic valve-November 2015   . Severe aortic insufficiency 12/30/2013  . 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 Acute respiratory failure   . Acute respiratory failure with hypoxia 12/29/2013  . Acute on chronic respiratory failure with hypoxia   . HCAP (healthcare-associated pneumonia)   . Acute systolic congestive heart failure   . Meningitis   . Bacterial endocarditis   . Bilateral edema of lower extremity   . Brain abscess   . Sepsis due to group B Streptococcus   . LFT elevation   . Meningitis, streptococcal   . Headache   . Pain in joint, lower leg   . Thrombocytopenia 12/23/2013  . Meningitis due to bacterium: November 2015 12/23/2013  . Acute renal failure syndrome   . Dehydration    Discharge Diagnoses:   Patient Active Problem List   Diagnosis Date Noted  . Group B streptococcal infection   . Bacterial meningitis   . Ventricular septal defect 02/12/2014  . S/P ventricular septal defect repair + redo tricuspid valve repair + redo closure PFO 02/12/2014  . S/P redo tricuspid valve repair 02/12/2014  . S/P redo patent foramen ovale closure 02/12/2014  . Dyspnea    . Pacemaker-medtronic.  02/11/2014  . Dyspnea on exertion 02/11/2014  . Tricuspid regurgitation 02/11/2014  . Acute CHF 01/04/2014  . S/P placement of cardiac pacemaker 01/04/2014  . Hypoxia   . Sepsis   . Third degree heart block 12/31/2013  . Endocarditis of aortic valve-November 2015   . Severe aortic insufficiency 12/30/2013  . 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 Acute respiratory failure   . Acute respiratory failure with hypoxia 12/29/2013  . Acute on chronic respiratory failure with hypoxia   . HCAP (healthcare-associated pneumonia)   . Acute systolic congestive heart failure   . Meningitis   . Bacterial endocarditis   . Bilateral edema of lower extremity   . Brain abscess   . Sepsis due to group B Streptococcus   . LFT elevation   . Meningitis, streptococcal   . Headache   . Pain in joint, lower leg   . Thrombocytopenia 12/23/2013  . Meningitis due to bacterium: November 2015 12/23/2013  . Acute renal failure syndrome   . Dehydration    Discharged Condition: good  History of Present Illness:    [name] is a 29 yo white male well known to [facility].  He originally presented in November of 2015.  At that time he was transferred from [facility] to [facility] on 12/23/2013 for suspected meningitis.  At that time the patient developed chills, rigors, headache, fever, neck stiffness, nausea and vomiting.  He presented to urgent care who diagnosed the  patient with the flu.  However follow up with his PCP caused concern for possible meningitis.  He was sent to the ED where lumbar puncture was done.  Upon arrival to [facility] he was placed on Vancomycin, Rocephin, and Acyclovir for Group B Streptococcal Meningitis.  Further workup with MRI of brain showed scattered micro abscess'.  The patient's condition gradually improved with ABX. Echocardiogram was later obtained and showed Aortic Insufficiency and Aortic Vegetation.  However, the morning of 12/29/2013 the patient became  acutely worse and required intubation.  TEE was performed and showed a large Aortic Vegetation with severe Aortic Insufficiency, tricuspid Valve Vegetation with least moderate TR and a fistula from the aorta and right atrium.  It was felt surgical intervention would be required.  Dr. Tyrone Sage evaluated the patient and performed an Ascending Aortic Root Replacement, Aortic Valve Replacement, closure of PFO and closure of LVOT to RA fistula on 12/30/2013.  The patient's hospital course was complicated by complete heart block requiring permanent pacemaker placement on 01/04/2014.  He was discharged home with IV ABX on 01/07/2014.  Initially patient did well.  He presented for routine office follow up 01/21/2014 at which time he was still feeling well.  However beginning 02/06/2014 the patient developed shortness of breath with minimal activity and occasionally at rest.  The patient also developed some abdominal bloating.  He did not notice any fevers.  He was evaluated by Dr. Tyrone Sage on 02/11/2014 at which point he felt due to the patient's worsening symptoms he should be admitted for further workup.   Hospital Course:   Upon admission to [facility] repeat blood cultures were obtained and were negative.  Echocardiogram showed a re-occurrence in the RVOT  And severe TR.  It was felt TEE should be obtained and CT surgery consult was again requested.  TEE was done and revealed a large VSD with left to right shunting from LVOT.  There was also disruption of the previously repaired fistula and Aortic Root Abscess Cavity.  It was felt surgical intervention would be required.  He was taken to the operating room by Dr. Cornelius Moras on 02/12/2014.  He underwent Redo Median Sternotomy, Repair of a VSD, Tricuspid Valve Repair, and Redo closure of PFO.  He tolerated the procedure and was taken to the SI CU in stable condition.  During his stay in the SICU he was transfused FFP and Cryo for mild coagulopathy.  He was treated with IV  Rocephin and ID consult was obtained.  They recommended addition of Rifampin to the ABX regimen until culture results were reported.    He was weaned off all drips as tolerated.  He was wean and extubated on POD #1.  He developed acute post operative blood loss anemia and was started on iron supplementation.  His mediastinal chest tubes were removed on POD #3.  He was medically stable and transferred to the stepdown unit.  The patient continues to improve.  His pleural chest tubes were removed without difficulty on POD #4.  His CXR shows a small residual left pleural effusion.  He does have some issues with pain control and he was taken off Oxy IR and given Percocet.  He also obtained relief with IV Toradol.  He has been monitored by ID who felt he would required Rocephin and Gentamycin for 6 weeks.  His Rifampin was discontinued.  The patient is medically stable at this time.  He will require Home Health for care of his IV ABX.  He will also require proper hydration and have his BMP w GFR checked BIWEEKLY, CBC + diff weekly, gentamicin levels per pharmacy at home health co. After two weeks of gent, then switch to weekly cbc plus diff and bmp and all of these should be faxed to me Dr. Daiva Eves @ [phone].  Should no further issues arise we anticipate discharge home on 02/18/2014     Consults: cardiology and ID  Significant Diagnostic Studies:   Echocardiogram: Results of TEE and right heart cath c/w large ventricular septal defect with left to right shunt eminating from the LVOT just below the allograft aortic valve. There is a large cavity filling with turbulent flow c/w disruption of the previously repaired fistula and aortic root abscess cavity. Some images from TEE suggest the communication is primarily from the LVOT into the RV beneath the insertion of the septal leaflet of the tricuspid valve, although the shunt could be into the RV, the RA or both. There is severe TR. There is no insufficiency of  the allograft aortic valve and no sign of false aneurysm around the aortic root, but it is impossible to discern whether or not the proximal suture line of the allograft aortic root is intact. There are no obvious vegetations, but the possibility of persistent infection cannot be ruled out.  Treatments: surgery:    Redo Median Sternotomy  Repair of Ventricular Septal Defect Double layer bovine pericardial patch closure  Tricuspid Valve Repair Complex Valvuloplasty including plication of septal leaflet Edwards mc3 Ring annuloplasty (size 26mm, model #4900, serial #[id number])  Redo Closure of Patent Foramen Ovale  Disposition: Home with Home Health  Discharge Medications:    Medication List    TAKE these medications        aspirin 325 MG EC tablet  Take 1 tablet (325 mg total) by mouth daily.     bismuth subsalicylate 262 MG/15ML suspension  Commonly known as:  PEPTO BISMOL  Take by mouth every 6 (six) hours as needed for indigestion.     cefTRIAXone 40 MG/ML IVPB  Commonly known as:  ROCEPHIN  2G in 5% Dextrose/ 50ml IVPB--- administer at 160ml/hr every 12 hours for 6 weeks     gentamicin 1.6-0.9 MG/ML-%  Commonly known as:  GARAMYCIN  80 mg IV at 100 ml every 12 hours for 6 weeks     multivitamin with minerals Tabs tablet  Take 1 tablet by mouth daily.     oxyCODONE-acetaminophen 5-325 MG per tablet  Commonly known as:  PERCOCET/ROXICET  Take 1-2 tablets by mouth every 4 (four) hours as needed for severe pain.     traMADol 50 MG tablet  Commonly known as:  ULTRAM  Take 1-2 tablets (50-100 mg total) by mouth every 4 (four) hours as needed for moderate pain.         The patient has been discharged on:   1.Beta Blocker:  Yes [   ]                              No   [ x  ]                              If No, reason: Bradycardia  2.Ace Inhibitor/ARB: Yes [   ]  No  [  x  ]                                      If No, reason: No CAD, labile blood pressure  3.Statin:   Yes [   ]                  No  [x   ]                  If No, reason: No CAD  4.Marlowe Kays:  Yes  [ x  ]                  No   [   ]                  If No, reason:       Follow-up Information    Follow up with [facility].   Why:  HH- RN arranged for home IV abx   Contact information:   [address] [place] [address] [phone]       Signed: [name] 02/17/2014, 9:03 AM

## 2014-02-17 NOTE — Progress Notes (Signed)
      [address]       [place] [address]             [phone]      5 Days Post-Op Procedure(s) (LRB): TRICUSPID VALVE REPAIR, REDO MEDIAN STERNOTOMY, REPAIR OF VENTRICULAR SEPTAL DEFECT (RECURRENT LV OUTFLOW TRACT TO RIGHT ATRIAL FISTULA), REDO CLOSURE OF PATENT FORAMEN OVALE (N/A)   Subjective:  [name] states he would like to sleep.  His pain has improved some but he did not use pain medication overnight.  He is ambulating.  + BM  Objective: Vital signs in last 24 hours: Temp:  [98 F (36.7 C)-98.6 F (37 C)] 98.6 F (37 C) (01/06 0452) Pulse Rate:  [97-109] 100 (01/06 0452) Cardiac Rhythm:  [-] Ventricular paced (01/06 0727) Resp:  [18-20] 18 (01/06 0452) BP: (110-133)/(67-79) 133/79 mmHg (01/06 0452) SpO2:  [90 %-94 %] 90 % (01/06 0452) Weight:  [180 lb (81.647 kg)] 180 lb (81.647 kg) (01/06 0452)  Intake/Output from previous day: 01/05 0701 - 01/06 0700 In: 480 [P.O.:480] Out: 1150 [Urine:1150]  General appearance: alert, cooperative and no distress Heart: regular rate and rhythm Lungs: diminished breath sounds left base Abdomen: soft, non-tender; bowel sounds normal; no masses,  no organomegaly Extremities: edema trace Wound: clean and dry  Lab Results:  Recent Labs  02/15/14 0445 02/16/14 0535  WBC 11.2* 9.7  HGB 7.9* 8.7*  HCT 24.8* 26.6*  PLT 120* 146*   BMET:  Recent Labs  02/15/14 0445 02/16/14 0535  NA 140 137  K 3.3* 3.7  CL 104 99  CO2 27 28  GLUCOSE 92 82  BUN 18 10  CREATININE 1.22 0.85  CALCIUM 7.9* 7.8*    PT/INR: No results for input(s): LABPROT, INR in the last 72 hours. ABG    Component Value Date/Time   PHART 7.374 02/14/2014 0358   HCO3 26.2* 02/14/2014 0358   TCO2 28 02/14/2014 0358   ACIDBASEDEF 1.0 02/12/2014 2236   O2SAT 93.0 02/14/2014 0358   CBG (last 3)   Recent Labs  02/14/14 2324 02/15/14 0456 02/15/14 0807  GLUCAP 94 90 91    Assessment/Plan: S/P Procedure(s)  (LRB): TRICUSPID VALVE REPAIR, REDO MEDIAN STERNOTOMY, REPAIR OF VENTRICULAR SEPTAL DEFECT (RECURRENT LV OUTFLOW TRACT TO RIGHT ATRIAL FISTULA), REDO CLOSURE OF PATENT FORAMEN OVALE (N/A)  1. CV- remains tachy, blood pressure has increased some- no blood pressure agents being used, will continue to monitor 2. Pulm- chest tubes removed yesterday, small left pleural effusion, no evidence of pneumothorax 3. Renal- creatinine not repeated today, has been on Toradol since yesterday will repeat BMET in AM 4. Endocarditis- continue Rocephin and Gentamycin per ID, recs made for discharge lab work will arrange with home health 5. Pain control- patient will try to not use Morphine today, continue Percocet, Toradol for now 6. Dispo- patient stable, pain control improved will try to not use IV Morphine today, continue ABX, possibly home this week   LOS: 6 days    Raford Pitcher, ERIN 02/17/2014

## 2014-02-17 NOTE — Progress Notes (Signed)
CARDIAC REHAB PHASE I   PRE:  Rate/Rhythm: 73 pacing    BP: sitting (up at door)    SaO2: 94 RA  MODE:  Ambulation: 1240 ft   POST:  Rate/Rhythm: 109 paicng    BP: sitting 129/80     SaO2: 97 RA  Pt moving very well with increased pace. HR lower today. Pt sts he has no pain, he feels good.  Very anxious to go home. SAO2 improved today, increased distance without problems. Reviewed ed. He is awaiting call from [facility] in [place] from prior referral. [phone]  Harriet Masson CES, ACSM 02/17/2014 11:42 AM

## 2014-02-17 NOTE — Progress Notes (Signed)
ANTIBIOTIC CONSULT NOTE - INITIAL  Pharmacy Consult for Gentamicin Indication: endocarditis with Group B Streptococcus  No Known Allergies  Patient Measurements: Height:  (165.1 cm) Weight: 180 lb (81.647 kg) IBW/kg (Calculated) : 61.5  Vital Signs: Temp: 98.4 F (36.9 C) (01/06 1947) Temp Source: Oral (01/06 1947) BP: 123/62 mmHg (01/06 1947) Pulse Rate: 113 (01/06 1947) Intake/Output from previous day: 01/05 0701 - 01/06 0700 In: 480 [P.O.:480] Out: 1150 [Urine:1150] Intake/Output from this shift:    Labs:  Recent Labs  02/15/14 0445 02/16/14 0535  WBC 11.2* 9.7  HGB 7.9* 8.7*  PLT 120* 146*  CREATININE 1.22 0.85   Estimated Creatinine Clearance: 129.5 mL/min (by C-G formula based on Cr of 0.85).  Microbiology: Recent Results (from the past 720 hour(s))  Culture, blood (routine x 2)     Status: None (Preliminary result)   Collection Time: 02/11/14  7:55 PM  Result Value Ref Range Status   Specimen Description BLOOD RIGHT ARM  Final   Special Requests   Final    BOTTLES DRAWN AEROBIC AND ANAEROBIC 10CC AER,2CC ANA   Culture   Final           BLOOD CULTURE RECEIVED NO GROWTH TO DATE CULTURE WILL BE HELD FOR 5 DAYS BEFORE ISSUING A FINAL NEGATIVE REPORT Performed at [facility]    Report Status PENDING  Incomplete  Culture, blood (routine x 2)     Status: None (Preliminary result)   Collection Time: 02/11/14  8:00 PM  Result Value Ref Range Status   Specimen Description BLOOD RIGHT ARM  Final   Special Requests BOTTLES DRAWN AEROBIC ONLY 5CC  Final   Culture   Final           BLOOD CULTURE RECEIVED NO GROWTH TO DATE CULTURE WILL BE HELD FOR 5 DAYS BEFORE ISSUING A FINAL NEGATIVE REPORT Performed at [facility]    Report Status PENDING  Incomplete  MRSA PCR Screening     Status: None   Collection Time: 02/11/14  8:40 PM  Result Value Ref Range Status   MRSA by PCR NEGATIVE NEGATIVE Final    Comment:        The GeneXpert MRSA Assay  (FDA approved for NASAL specimens only), is one component of a comprehensive MRSA colonization surveillance program. It is not intended to diagnose MRSA infection nor to guide or monitor treatment for MRSA infections.   Tissue culture     Status: None   Collection Time: 02/12/14  3:31 PM  Result Value Ref Range Status   Specimen Description TISSUE  Final   Special Requests   Final    LV OUTFLOW TRACT RIGHT ATRIAL FISTULA PATIENT ON FOLLOWING ZINACEF   Gram Stain   Final    NO WBC SEEN NO ORGANISMS SEEN Performed at [facility]    Culture   Final    NO GROWTH 3 DAYS Performed at [facility]    Report Status 02/16/2014 FINAL  Final  Gram stain     Status: None   Collection Time: 02/12/14  3:45 PM  Result Value Ref Range Status   Specimen Description ABSCESS  Final   Special Requests RIGHT ATRIAL FISTULA,PT ON ZINACEF  Final   Gram Stain   Final    FEW WBC PRESENT,BOTH PMN AND MONONUCLEAR NO ORGANISMS SEEN    Report Status 02/12/2014 FINAL  Final  Culture, routine-abscess     Status: None   Collection Time: 02/12/14  3:45  PM  Result Value Ref Range Status   Specimen Description ABSCESS  Final   Special Requests RIGHT ATRIAL FISTULA,PT ON ZINACEF  Final   Gram Stain   Final    FEW WBC PRESENT,BOTH PMN AND MONONUCLEAR NO SQUAMOUS EPITHELIAL CELLS SEEN NO ORGANISMS SEEN Performed at [facility] Performed at [facility]    Culture   Final    NO GROWTH 3 DAYS Performed at [facility]    Report Status 02/15/2014 FINAL  Final  Anaerobic culture     Status: None   Collection Time: 02/12/14  3:45 PM  Result Value Ref Range Status   Specimen Description ABSCESS  Final   Special Requests RIGHT ATRIAL FISTULA,PT ON ZINACEF  Final   Gram Stain   Final    FEW WBC PRESENT,BOTH PMN AND MONONUCLEAR NO SQUAMOUS EPITHELIAL CELLS SEEN NO ORGANISMS SEEN Performed at [facility] Performed at [facility]    Culture    Final    NO ANAEROBES ISOLATED Performed at [facility]    Report Status 02/17/2014 FINAL  Final    Medical History: Past Medical History  Diagnosis Date  . Obesity   . Smokeless tobacco use   . Fracture of left femur   . Meningitis     Group B Strep (November 2015)  . Patent foramen ovale     Closed during procedure on December 30, 2013  . History of open heart surgery     December 30, 2013- ascending aortic root replacement, TEE  . Endocarditis     related to meningitis   . Brain abscess   . Presence of permanent cardiac pacemaker 01/04/2014    PPM MEDTRONIC FOR COMPLETE HEART BLOCK  . Thrombocytopenia 12/2013  . AKI (acute kidney injury)   . 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 Tricuspid regurgitation 02/11/2014  . 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 S/P ventricular septal defect repair + redo tricuspid valve repair + redo closure PFO 02/12/2014    Redo sternotomy for bovine pericardial patch repair of large ventricular septal defect (recurrent LVOT to RA fistula due to bacterial endocarditis)  . S/P redo tricuspid valve repair 02/12/2014    Complex valvuloplasty including patch closure of VSD with plication of septal leaflet and 26 mm Edwards mc3 ring annuloplasty  . S/P redo patent foramen ovale closure 02/12/2014  . S/P placement of cardiac pacemaker 01/04/2014    Medtronic (serial number [id number]) dual chamber permanent pacemaker implanted by Dr Ladona Ridgel   Assessment:  POD # 3 re-do sternotomy for tricuspid valve repair, re-do PFO closure and VSD repair.  Day # 5 Ceftriaxone 2 grams IV q12hrs. Planning 6 weeks of Ceftriaxone. Gentamicin to be added for 14 days for synergy.   Was on Gentamicin in 12/2013 with prior Group B Streptococcus bacteremia with endocarditis  of aortic valve, meningitis and septic emboli to CNS/brain abscesses.  Gent trough level on 80 mg IV q8hrs was 1.2 mcg/ml when Scr 0.98.   Scr trended up to 1.22 today.  Initial gentamicin trough is within target range of <1 at 0.6 on gentamicin  IV q12h.  Goal of Therapy:  Gentamicin peak 3-4 mcg/ml, trough of <2 mcg/ml  Plan:  - Continue gentamicin  IV q12h - Follow renal function and clinical  course  Arlean Hopping. Newman Pies, PharmD Clinical Pharmacist Pager [phone] 02/17/2014,9:02 PM

## 2014-02-18 LAB — BASIC METABOLIC PANEL
Anion gap: 6 (ref 5–15)
BUN: 5 mg/dL — ABNORMAL LOW (ref 6–23)
CO2: 30 mmol/L (ref 19–32)
Calcium: 8.5 mg/dL (ref 8.4–10.5)
Chloride: 104 mEq/L (ref 96–112)
Creatinine, Ser: 0.8 mg/dL (ref 0.50–1.35)
GFR calc Af Amer: >90 mL/min (ref >90)
GFR calc non Af Amer: >90 mL/min (ref >90)
Glucose, Bld: 109 mg/dL — ABNORMAL HIGH (ref 70–99)
Potassium: 3.7 mmol/L (ref 3.5–5.1)
Sodium: 140 mmol/L (ref 135–145)

## 2014-02-18 LAB — CULTURE, BLOOD (ROUTINE X 2)
CULTURE: NO GROWTH
Culture: NO GROWTH

## 2014-02-18 MED ORDER — GENTAMICIN IN SALINE 1.6-0.9 MG/ML-% IV SOLN: INTRAVENOUS | Status: DC

## 2014-02-18 MED ORDER — OXYCODONE-ACETAMINOPHEN 5-325 MG PO TABS: 1.0000 | ORAL_TABLET | ORAL | Status: DC | PRN

## 2014-02-18 MED ORDER — HEPARIN SOD (PORK) LOCK FLUSH 100 UNIT/ML IV SOLN: 500.0000 [IU] | INTRAVENOUS | Status: DC | PRN

## 2014-02-18 MED ORDER — CEFTRIAXONE SODIUM IN DEXTROSE 40 MG/ML IV SOLN: INTRAVENOUS | Status: AC

## 2014-02-18 MED ORDER — HEPARIN SOD (PORK) LOCK FLUSH 100 UNIT/ML IV SOLN
250.0000 [IU] | INTRAVENOUS | Status: AC | PRN
  Administered 2014-02-18: 250 [IU]

## 2014-02-18 MED ORDER — TRAMADOL HCL 50 MG PO TABS: 50.0000 mg | ORAL_TABLET | ORAL | Status: DC | PRN

## 2014-02-18 NOTE — Progress Notes (Signed)
  Outpatient Parenteral Antimicrobial Therapy (OPAT) Pharmacy Monitoring  Guidelines developed by the Infectious Diseases Society of America (IDSA) recommend that hospitals who send patients home on intravenous (IV) antibiotics have an active performance improvement program.  IDSA guidelines also recommend that an infection specialist, such as a pharmacist, be involved in the evaluation of candidates for discharge with OPAT.      Yes No Comments  Assessed patient for appropriateness of OPAT? [x]  []  Is IV antimicrobial therapy needed? Is the home environment safe and adequate to support care? Are the patient/caregivers willing to participate and able to safely, effectively, and reliably deliver parenteral antimicrobial therapy? Is communication about problems and monitoring of therapy in place with patient? Does the patient/caregiver understand the benefits/risks with OPAT?  Are oral antibiotics an option? []  [x]  Patients diagnosed with endocarditis are not candidates for oral antibiotics.  Is antibiotic selected the best option for treatment indication? [x]  []  Can therapy be de-escalated further?  Is the dose appropriate? [x]  []  Consider renal function and administration schedule for outpatient setting.  Is the length of therapy appropriate? [x]  []  Consider disease-specific guidelines when available.  Recommend changing antibiotic regimen? []  [x]  If yes, recommend _________.    Final antibiotic regimen for discharge is ceftriaxone 2 gm IV q12h for 6 weeks + gentamicin 80 mg IV q12h for 2 weeks (include drug, dose, frequency and duration). Laboratory monitoring and follow-up for discharge with OPAT is BMET (renal function) twice weekly for 2 weeks while on gentamicin then once weekly for remaining 4 weeks + CBC with diff. weekly.   Thank you for allowing pharmacy to be a part of this patient's care.  Sharlena Kristensen L. Roseanne Reno, PharmD Clinical Pharmacy Resident Pager: [phone] 02/18/2014 8:15  AM

## 2014-02-18 NOTE — Progress Notes (Signed)
Patient anxious to go home, Pt given discharge instruction and avs with medication list and paper prescriptions all reviewed with patient and significant other. All questions were answered. CT sutures removed and steri strps applied.  Will discharge home as ordered. Pleshette Tomasini, Randall An RN

## 2014-02-18 NOTE — Discharge Instructions (Signed)
Aortic Valve Replacement, Care After Refer to this sheet in the next few weeks. These instructions provide you with information on caring for yourself after your procedure. Your health care provider may also give you specific instructions. Your treatment has been planned according to current medical practices, but problems sometimes occur. Call your health care provider if you have any problems or questions after your procedure. HOME CARE INSTRUCTIONS   Take medicines only as directed by your health care provider.  If your health care provider has prescribed elastic stockings, wear them as directed.  Take frequent naps or rest often throughout the day.  Avoid lifting over 10 lbs (4.5 kg) or pushing or pulling things with your arms for 6-8 weeks or as directed by your health care provider.  Avoid driving or airplane travel for 4-6 weeks after surgery or as directed by your health care provider. If you are riding in a car for an extended period, stop every 1-2 hours to stretch your legs. Keep a record of your medicines and medical history with you when traveling.  Do not drive or operate heavy machinery while taking pain medicine. (narcotics).  Do not cross your legs.  Do not use any tobacco products including cigarettes, chewing tobacco, or electronic cigarettes. If you need help quitting, ask your health care provider.  Do not take baths, swim, or use a hot tub until your health care provider approves. Take showers once your health care provider approves. Pat incisions dry. Do not rub incisions with a washcloth or towel.  Avoid climbing stairs and using the handrail to pull yourself up for the first 2-3 weeks after surgery.  Return to work as directed by your health care provider.  Drink enough fluid to keep your urine clear or pale yellow.  Do not strain to have a bowel movement. Eat high-fiber foods if you become constipated. You may also take a medicine to help you have a bowel  movement (laxative) as directed by your health care provider.  Resume sexual activity as directed by your health care provider. Men should not use medicines for erectile dysfunction until their doctor says it isokay.  If you had a certain type of heart condition in the past, you may need to take antibiotic medicine before having dental work or surgery. Let your dentist and health care providers know if you had one or more of the following:  Previous endocarditis.  An artificial (prosthetic) heart valve.  Congenital heart disease. SEEK MEDICAL CARE IF:  You develop a skin rash.   You experience sudden changes in your weight.  You have a fever. SEEK IMMEDIATE MEDICAL CARE IF:   You develop chest pain that is not coming from your incision.  You have drainage (pus), redness, swelling, or pain at your incision site.   You develop shortness of breath or have difficulty breathing.   You have increased bleeding from your incision site.   You develop light-headedness.  MAKE SURE YOU:   Understand these directions.  Will watch your condition.  Will get help right away if you are not doing well or get worse. Document Released: 08/17/2004 Document Revised: 06/15/2013 Document Reviewed: 11/13/2011 [facility] Patient Information 2015 [place], [place]. This information is not intended to replace advice given to you by your health care provider. Make sure you discuss any questions you have with your health care provider.    HOME HEALTH ORDERS:   Ensure proper hydration and have his BMP w GFR checked BIWEEKLY, CBC + diff  weekly, gentamicin levels per pharmacy at home health co. After two weeks of gent, then swithc to weekly cbc plus diff and bmp and all of these should be faxed to me Dr. Daiva Eves @ [phone]

## 2014-02-18 NOTE — Progress Notes (Addendum)
      [address]       [place] [address]             [phone]       Days Post-Op Procedure(s) (LRB): TRICUSPID VALVE REPAIR, REDO MEDIAN STERNOTOMY, REPAIR OF VENTRICULAR SEPTAL DEFECT (RECURRENT LV OUTFLOW TRACT TO RIGHT ATRIAL FISTULA), REDO CLOSURE OF PATENT FORAMEN OVALE (N/A)   Subjective:  [name] has no complaints this morning.  He is hoping to be discharged today.  He is ambulating without difficulty. +BM  Objective: Vital signs in last 24 hours: Temp:  [97.9 F (36.6 C)-98.4 F (36.9 C)] 97.9 F (36.6 C) (01/07 0450) Pulse Rate:  [63-113] 63 (01/07 0450) Cardiac Rhythm:  [-] Ventricular paced (01/06 1930) Resp:  [16-18] 18 (01/07 0450) BP: (122-124)/(62-73) 124/73 mmHg (01/07 0450) SpO2:  [96 %-97 %] 96 % (01/07 0450) Weight:  [177 lb 3.2 oz (80.377 kg)] 177 lb 3.2 oz (80.377 kg) (01/07 0450)  Intake/Output from previous day: 01/06 0701 - 01/07 0700 In: 220 [P.O.:220] Out: 976 [Urine:976]  General appearance: alert, cooperative and no distress Heart: regular rate and rhythm and tachy Lungs: clear to auscultation bilaterally Abdomen: soft, non-tender; bowel sounds normal; no masses,  no organomegaly Extremities: edema trace Wound: clean and dry  Lab Results:  Recent Labs  02/16/14 0535  WBC 9.7  HGB 8.7*  HCT 26.6*  PLT 146*   BMET:  Recent Labs  02/16/14 0535 02/18/14 0425  NA 137 140  K 3.7 3.7  CL 99 104  CO2 28 30  GLUCOSE 82 109*  BUN 10 5*  CREATININE 0.85 0.80  CALCIUM 7.8* 8.5    PT/INR: No results for input(s): LABPROT, INR in the last 72 hours. ABG    Component Value Date/Time   PHART 7.374 02/14/2014 0358   HCO3 26.2* 02/14/2014 0358   TCO2 28 02/14/2014 0358   ACIDBASEDEF 1.0 02/12/2014 2236   O2SAT 93.0 02/14/2014 0358   CBG (last 3)   Recent Labs  02/15/14 0807  GLUCAP 91    Assessment/Plan: S/P Procedure(s) (LRB): TRICUSPID VALVE REPAIR, REDO MEDIAN STERNOTOMY, REPAIR OF VENTRICULAR  SEPTAL DEFECT (RECURRENT LV OUTFLOW TRACT TO RIGHT ATRIAL FISTULA), REDO CLOSURE OF PATENT FORAMEN OVALE (N/A)  1. CV- NSR, good pressure control- continue to hold blood pressure agents 2. Pulm- no dyspnea, off oxygen continue IS 3. Renal- creatinine WNL, weight is at baseline, will taper Lasix 4. ID- continue IV Rocephin and Gentamycin per recs 5. Dispo- patient doing very well, would like to go home today, H/H orders placed- will d/c if okay with Dr. Cornelius Moras   LOS: 7 days    [name] 02/18/2014  I have seen and examined the patient and agree with the assessment and plan as outlined.  D/C home today.  Stop lasix.  [name] 02/18/2014 8:50 AM

## 2014-02-22 ENCOUNTER — Encounter: Payer: Self-pay | Admitting: Internal Medicine

## 2014-02-24 ENCOUNTER — Other Ambulatory Visit: Payer: Self-pay

## 2014-02-24 DIAGNOSIS — Z736 Limitation of activities due to disability: Secondary | ICD-10-CM

## 2014-02-24 DIAGNOSIS — G8918 Other acute postprocedural pain: Secondary | ICD-10-CM

## 2014-02-24 MED ORDER — OXYCODONE-ACETAMINOPHEN 5-325 MG PO TABS: 1.0000 | ORAL_TABLET | Freq: Four times a day (QID) | ORAL | Status: DC | PRN

## 2014-02-24 NOTE — Telephone Encounter (Signed)
RX refill given for OxyCodone 5/325 mg #40, no refills

## 2014-02-26 NOTE — Progress Notes (Signed)
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 At fu ov, patient with increased SOB and murmur. Readmitted and found to have recurrent fistula and severe TR. Patient then had redo surgery with closure of VSD, TV repair and redo closure of PFO. Has been treated with antibiotics. Since DC,  the patient has dyspnea with more extreme activities but not with routine activities. It is relieved with rest. It is not associated with chest pain. There is no orthopnea, PND or pedal edema. There is no syncope or palpitations. There is no exertional chest pain. No fevers or chills.   Current Outpatient Prescriptions  Medication Sig Dispense Refill  . aspirin EC 325 MG EC tablet Take 1 tablet (325 mg total) by mouth daily. 30 tablet 0  . bismuth subsalicylate (PEPTO BISMOL) 262 MG/15ML suspension Take by mouth every 6 (six) hours as needed for indigestion.    . cefTRIAXone (ROCEPHIN) 40 MG/ML IVPB 2G in 5% Dextrose/ 50ml IVPB--- administer at 163ml/hr every 12 hours for 6 weeks 50 mL   . gentamicin (GARAMYCIN) 1.6-0.9 MG/ML-% 80 mg IV at 100 ml every 12 hours for 6 weeks 50 mL   . Multiple Vitamin (MULTIVITAMIN WITH MINERALS) TABS tablet Take 1 tablet by mouth daily.    Marland Kitchen oxyCODONE-acetaminophen (PERCOCET/ROXICET) 5-325 MG per tablet Take 1 tablet by mouth every 6 (six) hours as needed for severe pain. 40 tablet 0  . traMADol (ULTRAM) 50 MG tablet Take 1-2 tablets (50-100 mg total) by mouth every 4 (four) hours as needed for moderate pain. 30 tablet 0   No current facility-administered medications for this visit.     Past Medical History  Diagnosis Date  . Obesity   . Smokeless tobacco use   . Fracture of left femur   . Meningitis     Group B Strep (November 2015)  . Patent  foramen ovale     Closed during procedure on December 30, 2013  . History of open heart surgery     December 30, 2013- ascending aortic root replacement, TEE  . Endocarditis     related to meningitis   . Brain abscess   . Presence of permanent cardiac pacemaker 01/04/2014    PPM MEDTRONIC FOR COMPLETE HEART BLOCK  . Thrombocytopenia 12/2013  . AKI (acute kidney injury)   . 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 Tricuspid regurgitation 02/11/2014  . 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 S/P ventricular septal defect repair + redo tricuspid valve repair + redo closure PFO 02/12/2014    Redo sternotomy for bovine pericardial patch repair of large ventricular septal defect (recurrent LVOT to RA fistula due to bacterial endocarditis)  . S/P redo tricuspid valve repair 02/12/2014    Complex valvuloplasty including patch closure of VSD with plication of septal leaflet and 26 mm Edwards mc3 ring annuloplasty  . S/P redo patent foramen ovale closure 02/12/2014  . S/P placement of cardiac pacemaker 01/04/2014    Medtronic (serial number [id number]) dual chamber permanent pacemaker implanted by Dr Ladona Ridgel    Past Surgical History  Procedure Laterality  Date  . Ascending aortic root replacement N/A 12/30/2013    Procedure: ASCENDING AORTIC ROOT REPLACEMENT with 70mm HOMOGRAFT, CLOSURE OF AORTIC TO RIGHT ATRIAL FISTULA, CLOSURE OF PATENT FORAMEN OVALE;  Surgeon: Delight Ovens, MD;  Location: [facility] OR;  Service: Open Heart Surgery;  Laterality: N/A;  . Tricuspid valve replacement N/A 12/30/2013    Procedure: REPAIR OF TRICUSPID VALVE LEAFLET;  Surgeon: Delight Ovens, MD;  Location: [facility] OR;  Service: Open Heart Surgery;  Laterality: N/A;  . Intraoperative transesophageal echocardiogram N/A 12/30/2013     Procedure: INTRAOPERATIVE TRANSESOPHAGEAL ECHOCARDIOGRAM;  Surgeon: Delight Ovens, MD;  Location: [facility] OR;  Service: Open Heart Surgery;  Laterality: N/A;  . Permanent pacemaker insertion N/A 01/04/2014    Procedure: PERMANENT PACEMAKER INSERTION;  Surgeon: Marinus Maw, MD;  Location: [facility] CATH LAB;  Service: Cardiovascular;  Laterality: N/A;  . Insert / replace / remove pacemaker  01/04/2014  . Right heart catheterization N/A 02/12/2014    Procedure: RIGHT HEART CATH;  Surgeon: Laurey Morale, MD;  Location: [facility] CATH LAB;  Service: Cardiovascular;  Laterality: N/A;  . Tee without cardioversion N/A 02/12/2014    Procedure: TRANSESOPHAGEAL ECHOCARDIOGRAM (TEE);  Surgeon: Quintella Reichert, MD;  Location: [facility] ENDOSCOPY;  Service: Cardiovascular;  Laterality: N/A;  . Tricuspid valve replacement N/A 02/12/2014    Procedure: TRICUSPID VALVE REPAIR, REDO MEDIAN STERNOTOMY, REPAIR OF VENTRICULAR SEPTAL DEFECT (RECURRENT LV OUTFLOW TRACT TO RIGHT ATRIAL FISTULA), REDO CLOSURE OF PATENT FORAMEN OVALE;  Surgeon: Purcell Nails, MD;  Location: [facility] OR;  Service: Open Heart Surgery;  Laterality: N/A;    History   Social History  . Marital Status: Married    Spouse Name: N/A    Number of Children: N/A  . Years of Education: N/A   Occupational History  . Not on file.   Social History Main Topics  . Smoking status: Never Smoker   . Smokeless tobacco: Former Neurosurgeon    Quit date: 12/30/2013  . Alcohol Use: Yes  . Drug Use: No  . Sexual Activity: Not on file   Other Topics Concern  . Not on file   Social History Narrative    ROS: no fevers or chills, productive cough, hemoptysis, dysphasia, odynophagia, melena, hematochezia, dysuria, hematuria, rash, seizure activity, orthopnea, PND, pedal edema, claudication. Remaining systems are negative.  Physical Exam: Well-developed well-nourished in no acute distress.  Skin is warm and dry.  HEENT is normal.  Neck is supple.  Chest is clear to auscultation  with normal expansion. Sternotomy without evidence of infection. Pacer site with no hematoma. Cardiovascular exam is regular rate and rhythm. 2/6 systolic murmur left sternal border. No diastolic murmur. Abdominal exam nontender or distended. No masses palpated. Extremities show no edema. neuro grossly intact

## 2014-03-01 ENCOUNTER — Telehealth: Payer: Self-pay | Admitting: Infectious Disease

## 2014-03-01 ENCOUNTER — Encounter: Payer: Self-pay | Admitting: Cardiology

## 2014-03-01 ENCOUNTER — Ambulatory Visit (INDEPENDENT_AMBULATORY_CARE_PROVIDER_SITE_OTHER): Payer: BC Managed Care – PPO | Admitting: Cardiology

## 2014-03-01 VITALS — BP 110/80 | HR 90 | Ht 65.0 in | Wt 175.6 lb

## 2014-03-01 DIAGNOSIS — I358 Other nonrheumatic aortic valve disorders: Secondary | ICD-10-CM

## 2014-03-01 DIAGNOSIS — Z950 Presence of cardiac pacemaker: Secondary | ICD-10-CM

## 2014-03-01 DIAGNOSIS — I359 Nonrheumatic aortic valve disorder, unspecified: Secondary | ICD-10-CM

## 2014-03-01 DIAGNOSIS — Z954 Presence of other heart-valve replacement: Secondary | ICD-10-CM

## 2014-03-01 DIAGNOSIS — Z953 Presence of xenogenic heart valve: Secondary | ICD-10-CM

## 2014-03-01 NOTE — Assessment & Plan Note (Signed)
Patient is doing well symptomatically. He denies any dyspnea. No fevers or chills. Continue antibiotics as outlined by infectious disease. I discussed the importance of SBE prophylaxis. We provided a card today. Schedule baseline echocardiogram.

## 2014-03-01 NOTE — Patient Instructions (Signed)
Your physician recommends that you schedule a follow-up appointment in: 8 WEEKS WITH DR CRENSHAW  Your physician has requested that you have an echocardiogram. Echocardiography is a painless test that uses sound waves to create images of your heart. It provides your doctor with information about the size and shape of your heart and how well your heart's chambers and valves are working. This procedure takes approximately one hour. There are no restrictions for this procedure.   

## 2014-03-01 NOTE — Assessment & Plan Note (Signed)
Follow-up electrophysiology. 

## 2014-03-01 NOTE — Telephone Encounter (Signed)
Is one of the scan in [name] PCR to pursue [place] which shows that he still has group B strep that was isolated on DNA from his heart tissue that was removed

## 2014-03-01 NOTE — Assessment & Plan Note (Signed)
Patient is being followed by infectious disease. Continue antibiotics.

## 2014-03-03 ENCOUNTER — Ambulatory Visit: Payer: BC Managed Care – PPO | Admitting: Infectious Disease

## 2014-03-04 ENCOUNTER — Ambulatory Visit
Admission: RE | Admit: 2014-03-04 | Discharge: 2014-03-04 | Disposition: A | Discharge status: Home / Self Care | Payer: BC Managed Care – PPO | Source: Ambulatory Visit | Attending: Cardiothoracic Surgery | Admitting: Cardiothoracic Surgery

## 2014-03-04 ENCOUNTER — Encounter: Payer: Self-pay | Admitting: Cardiothoracic Surgery

## 2014-03-04 ENCOUNTER — Other Ambulatory Visit: Payer: Self-pay | Admitting: *Deleted

## 2014-03-04 ENCOUNTER — Ambulatory Visit (INDEPENDENT_AMBULATORY_CARE_PROVIDER_SITE_OTHER): Payer: Self-pay | Admitting: Cardiothoracic Surgery

## 2014-03-04 VITALS — BP 116/76 | HR 100 | Resp 16 | Ht 65.0 in | Wt 175.0 lb

## 2014-03-04 DIAGNOSIS — G8918 Other acute postprocedural pain: Secondary | ICD-10-CM

## 2014-03-04 DIAGNOSIS — Q211 Atrial septal defect: Secondary | ICD-10-CM

## 2014-03-04 DIAGNOSIS — I330 Acute and subacute infective endocarditis: Secondary | ICD-10-CM

## 2014-03-04 DIAGNOSIS — Q2112 Patent foramen ovale: Secondary | ICD-10-CM

## 2014-03-04 DIAGNOSIS — Z9889 Other specified postprocedural states: Secondary | ICD-10-CM

## 2014-03-04 DIAGNOSIS — I38 Endocarditis, valve unspecified: Secondary | ICD-10-CM

## 2014-03-04 DIAGNOSIS — I5189 Other ill-defined heart diseases: Secondary | ICD-10-CM

## 2014-03-04 DIAGNOSIS — I351 Nonrheumatic aortic (valve) insufficiency: Secondary | ICD-10-CM

## 2014-03-04 DIAGNOSIS — Z8774 Personal history of (corrected) congenital malformations of heart and circulatory system: Secondary | ICD-10-CM

## 2014-03-04 NOTE — Progress Notes (Signed)
[address]       [place] [address]             [phone]      [facility] Medical Record #[id number] Date of Birth: 1985/08/14  Referring: [name], MD Primary Care: [name], MD  Chief Complaint:   POST OP FOLLOW UP Past Surgical History  Procedure Laterality Date  . Ascending aortic root replacement N/A 12/30/2013    Procedure: ASCENDING AORTIC ROOT REPLACEMENT with 73mm HOMOGRAFT, CLOSURE OF AORTIC TO RIGHT ATRIAL FISTULA, CLOSURE OF PATENT FORAMEN OVALE;  Surgeon: Delight Ovens, MD;  Location: [facility] OR;  Service: Open Heart Surgery;  Laterality: N/A;  . Tricuspid valve replacement N/A 12/30/2013    Procedure: REPAIR OF TRICUSPID VALVE LEAFLET;  Surgeon: Delight Ovens, MD;  Location: [facility] OR;  Service: Open Heart Surgery;  Laterality: N/A;  . Intraoperative transesophageal echocardiogram N/A 12/30/2013    Procedure: INTRAOPERATIVE TRANSESOPHAGEAL ECHOCARDIOGRAM;  Surgeon: Delight Ovens, MD;  Location: [facility] OR;  Service: Open Heart Surgery;  Laterality: N/A;  . Permanent pacemaker insertion N/A 01/04/2014    Procedure: PERMANENT PACEMAKER INSERTION;  Surgeon: Marinus Maw, MD;  Location: [facility] CATH LAB;  Service: Cardiovascular;  Laterality: N/A;  . Insert / replace / remove pacemaker  01/04/2014  . Right heart catheterization N/A 02/12/2014    Procedure: RIGHT HEART CATH;  Surgeon: Laurey Morale, MD;  Location: [facility] CATH LAB;  Service: Cardiovascular;  Laterality: N/A;  . Tee without cardioversion N/A 02/12/2014    Procedure: TRANSESOPHAGEAL ECHOCARDIOGRAM (TEE);  Surgeon: Quintella Reichert, MD;  Location: [facility] ENDOSCOPY;  Service: Cardiovascular;  Laterality: N/A;  . 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 After his last visit to the office he was urgently admitted and underwent repair of ventricular septal defect by Dr. Cornelius Moras. Compared to the patient's last visit to the hospital he is much improved denies any shortness of breath remains active around the house without any difficulty. He had some left flank discomfort.   Past Medical History  Diagnosis Date  . Obesity   . Smokeless tobacco use   . Fracture of left femur   . Meningitis     Group B Strep (November 2015)  . Patent foramen ovale     Closed during procedure on December 30, 2013  . History of open heart surgery     December 30, 2013- ascending aortic root replacement, TEE  . Endocarditis     related to meningitis   . Brain abscess   . Presence of permanent cardiac pacemaker 01/04/2014    PPM MEDTRONIC FOR COMPLETE HEART BLOCK  . Thrombocytopenia 12/2013  . AKI (acute kidney injury)   . 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 Tricuspid regurgitation 02/11/2014  . 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  S/P ventricular septal defect repair + redo tricuspid valve repair + redo closure PFO 02/12/2014    Redo sternotomy for bovine pericardial patch repair of large ventricular septal defect (recurrent LVOT to RA fistula due to bacterial endocarditis)  . S/P redo tricuspid valve repair 02/12/2014    Complex valvuloplasty including patch closure of VSD with plication of septal leaflet and 26 mm Edwards mc3 ring annuloplasty  . S/P redo patent foramen ovale closure 02/12/2014  . S/P placement of  cardiac pacemaker 01/04/2014    Medtronic (serial number [id number]) dual chamber permanent pacemaker implanted by Dr Ladona Ridgel     History  Smoking status  . Never Smoker   Smokeless tobacco  . Former Neurosurgeon  . Quit date: 12/30/2013    History  Alcohol Use  . Yes     No Known Allergies  Current Outpatient Prescriptions  Medication Sig Dispense Refill  . aspirin EC 325 MG EC tablet Take 1 tablet (325 mg total) by mouth daily. 30 tablet 0  . bismuth subsalicylate (PEPTO BISMOL) 262 MG/15ML suspension Take by mouth every 6 (six) hours as needed for indigestion.    . cefTRIAXone (ROCEPHIN) 40 MG/ML IVPB 2G in 5% Dextrose/ 50ml IVPB--- administer at 15ml/hr every 12 hours for 6 weeks 50 mL   . gentamicin (GARAMYCIN) 1.6-0.9 MG/ML-% 80 mg IV at 100 ml every 12 hours for 6 weeks 50 mL   . Multiple Vitamin (MULTIVITAMIN WITH MINERALS) TABS tablet Take 1 tablet by mouth daily.    Marland Kitchen oxyCODONE-acetaminophen (PERCOCET/ROXICET) 5-325 MG per tablet Take 1 tablet by mouth every 6 (six) hours as needed for severe pain. 40 tablet 0  . traMADol (ULTRAM) 50 MG tablet Take 1-2 tablets (50-100 mg total) by mouth every 4 (four) hours as needed for moderate pain. 30 tablet 0   No current facility-administered medications for this visit.       Physical Exam: BP 116/76 mmHg  Pulse 100  Resp 16  Ht  (1.651 m)  Wt 175 lb (79.379 kg)  BMI 29.12 kg/m2  SpO2 98%  General appearance: alert and cooperative Neurologic: intact Heart: Since last seen the character of her his murmur has significantly improved , now there is an early systolic ejection murmur I do not appreciate any murmur of aortic insufficiency  Lungs: clear to auscultation bilaterally, do not appreciate any pleural rub Abdomen: soft, non-tender; bowel sounds normal; no masses,  no organomegaly Extremities: extremities normal, atraumatic, no cyanosis or edema and Homans sign is negative, no sign of DVT Wound: Sternum is stable and  well-healed He has no flank tenderness on either side He has no pedal edema Diagnostic Studies & Laboratory data:     Recent Radiology Findings:   Dg Chest 2 View  03/04/2014   CLINICAL DATA:  Chest pain .  EXAM: CHEST  2 VIEW  COMPARISON:  02/17/2014.  02/16/2014.  FINDINGS: Right PICC line in good anatomic position. Mediastinum and hilar structures are normal. Cardiomegaly. Prior CABG. Cardiac pacer and lead tips in right atrium and right ventricle. Previously identified pulmonary edema and atelectasis has cleared. No pleural effusion or pneumothorax. No acute bony abnormality.  IMPRESSION: 1. Right PICC line in stable position. Cardiac pacer stable position. Prior CABG. No CHF. 2. Previously identified pulmonary edema and basilar atelectasis has cleared.   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 He continues on IV antibiotics for endocarditis, Plan to see him back in 4 weeks Follow-up echo planned for next week To see infectious disease next week.   Delight Ovens MD      [address] [place] [address] Office [phone]   Beeper [phone]  03/04/2014 10:00 AM

## 2014-03-08 ENCOUNTER — Ambulatory Visit (HOSPITAL_COMMUNITY)
Admission: RE | Admit: 2014-03-08 | Discharge: 2014-03-08 | Disposition: A | Discharge status: Home / Self Care | Payer: BC Managed Care – PPO | Source: Ambulatory Visit | Attending: Cardiology | Admitting: Cardiology

## 2014-03-08 ENCOUNTER — Other Ambulatory Visit: Payer: Self-pay | Admitting: *Deleted

## 2014-03-08 DIAGNOSIS — I359 Nonrheumatic aortic valve disorder, unspecified: Secondary | ICD-10-CM | POA: Insufficient documentation

## 2014-03-08 DIAGNOSIS — I351 Nonrheumatic aortic (valve) insufficiency: Secondary | ICD-10-CM | POA: Insufficient documentation

## 2014-03-08 NOTE — Progress Notes (Signed)
2D Echo Performed 03/08/2014    Linley Moxley, RCS  

## 2014-03-10 ENCOUNTER — Encounter: Payer: Self-pay | Admitting: Infectious Disease

## 2014-03-10 ENCOUNTER — Ambulatory Visit (INDEPENDENT_AMBULATORY_CARE_PROVIDER_SITE_OTHER): Payer: BC Managed Care – PPO | Admitting: Infectious Disease

## 2014-03-10 ENCOUNTER — Other Ambulatory Visit: Payer: Self-pay | Admitting: Thoracic Surgery (Cardiothoracic Vascular Surgery)

## 2014-03-10 VITALS — BP 119/79 | HR 98 | Temp 98.2°F | Wt 173.0 lb

## 2014-03-10 DIAGNOSIS — I071 Rheumatic tricuspid insufficiency: Secondary | ICD-10-CM

## 2014-03-10 DIAGNOSIS — I330 Acute and subacute infective endocarditis: Secondary | ICD-10-CM | POA: Diagnosis not present

## 2014-03-10 DIAGNOSIS — B951 Streptococcus, group B, as the cause of diseases classified elsewhere: Secondary | ICD-10-CM

## 2014-03-10 DIAGNOSIS — G002 Streptococcal meningitis: Secondary | ICD-10-CM | POA: Diagnosis not present

## 2014-03-10 DIAGNOSIS — Z9889 Other specified postprocedural states: Secondary | ICD-10-CM

## 2014-03-10 DIAGNOSIS — A401 Sepsis due to streptococcus, group B: Secondary | ICD-10-CM

## 2014-03-10 DIAGNOSIS — M545 Low back pain, unspecified: Secondary | ICD-10-CM | POA: Insufficient documentation

## 2014-03-10 DIAGNOSIS — Q210 Ventricular septal defect: Secondary | ICD-10-CM

## 2014-03-10 DIAGNOSIS — I358 Other nonrheumatic aortic valve disorders: Secondary | ICD-10-CM

## 2014-03-10 DIAGNOSIS — G009 Bacterial meningitis, unspecified: Secondary | ICD-10-CM | POA: Diagnosis not present

## 2014-03-10 DIAGNOSIS — A491 Streptococcal infection, unspecified site: Secondary | ICD-10-CM

## 2014-03-10 DIAGNOSIS — Z950 Presence of cardiac pacemaker: Secondary | ICD-10-CM

## 2014-03-10 DIAGNOSIS — Z8774 Personal history of (corrected) congenital malformations of heart and circulatory system: Secondary | ICD-10-CM

## 2014-03-10 DIAGNOSIS — G060 Intracranial abscess and granuloma: Secondary | ICD-10-CM

## 2014-03-10 MED ORDER — AMOXICILLIN 500 MG PO CAPS: 500.0000 mg | ORAL_CAPSULE | Freq: Three times a day (TID) | ORAL | Status: DC

## 2014-03-10 NOTE — Patient Instructions (Signed)
F/u in 2 months

## 2014-03-10 NOTE — Progress Notes (Signed)
Subjective:    Patient ID: [name], male    DOB: 02-04-1986, 29 y.o.   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 Valve sent to [facility] showed GBS by ribosomal 16S sequencing.   He has had trouble tolerating the high dose ceftriaxone and also completed 2 weeks of gentamicin. He was on Rocephin when I saw him on 02/02/14 and getting used to the abx but a few days later was found to have worsening dyspnea, heart failure change in heart sounds and was admitted and found  To have new Ventricular Septal Defect (recurrent LV outflow tract to right atrial fistula, Severe Tricuspid Regurgitatio, Recurrent Patent Foramen Ovale, and Acute Congestive Heart Failure.   He underwent :   Redo Median Sternotomy  Repair of Ventricular Septal Defect Double layer bovine pericardial patch closure  Tricuspid Valve Repair Complex Valvuloplasty including plication of septal leaflet Edwards mc3 Ring annuloplasty  On January 1st, 2016. Tissue was sent off to [facility] where PCR of Ribosome AGAIN WAS POSITIVE FOR GROUP B STREPTOCOCCUS.        IN the interim she has been on Rocephin high dose along with 2 weeks of gentamicin (which he has finished).  His repeat Echo shows stability of his postoperative repairs.  He has had sensation of knot in lower back but there is nothing palpable there to suggest soft tissue infection.   Review of Systems  Constitutional: Negative for fever, chills, diaphoresis, activity change and appetite change.  HENT: Negative for congestion, rhinorrhea, sinus pressure, sneezing, sore throat and trouble swallowing.     Eyes: Negative for photophobia and visual disturbance.  Respiratory: Negative for cough, chest tightness, shortness of breath, wheezing and stridor.   Cardiovascular: Negative for chest pain, palpitations and leg swelling.  Gastrointestinal: Negative for nausea, vomiting, diarrhea, constipation, blood in stool and abdominal distention.  Genitourinary: Negative for dysuria, hematuria, flank pain and difficulty urinating.  Musculoskeletal: Negative for myalgias, back pain, joint swelling, arthralgias and gait problem.  Skin: Negative for color change, pallor, rash and wound.  Neurological: Negative for dizziness, tremors, weakness and light-headedness.  Hematological: Negative for adenopathy. Does not bruise/bleed easily.  Psychiatric/Behavioral: Negative for behavioral problems, confusion, sleep disturbance, dysphoric mood, decreased concentration and agitation.       Objective:   Physical Exam  Constitutional: He is oriented to person, place, and time. He appears well-developed and well-nourished.  HENT:  Head: Normocephalic and atraumatic.  Eyes: Conjunctivae and EOM are normal.  Neck: Normal range of motion. Neck supple.  Cardiovascular: Regular rhythm.  Bradycardia present.   Pulmonary/Chest: Effort normal. No respiratory distress. He has no wheezes.  Abdominal: Soft. He exhibits no distension.  Musculoskeletal: Normal range of motion. He exhibits no edema or tenderness.  Neurological: He is alert and oriented to person, place, and time.  Skin: Skin is warm and dry. No rash noted. No erythema. No pallor.  Psychiatric: He has a normal mood and affect. His behavior is normal. Judgment and thought content normal.   PM site clean today as it was on  02/02/14      Sternotomy clean :     PICC line clean  Assessment & Plan:   Group B streptococcal  Aortic valve endocarditis and Aorto-Right Atrial fistula sp AVR, TV repair, repair of Aorto to Right atrial  fistula, placement of PM due to 3rd degree heart block, then Ventricular Septal Defect (recurrent LV outflow tract to right atrial fistula, Severe Tricuspid Regurgitatio, Recurrent Patent Foramen Ovale, and Acute Congestive Heart Failure. Sp Redo Median Sternotom, Repair of Ventricular Septal Defect, Double layer bovine pericardial patch closure, Tricuspid Valve Repair, Complex Valvuloplasty including plication of septal leaflet, Edwards mc3 Ring annuloplasty with Group B Streptococcus isolated from heart valve  We will continue the IV rocephin thru 6 weeks postop and then have him go onto oral amoxicillin 500mg  tid for a few months  I spent greater than 25 minutes with the patient including greater than 50% of time in face to face counsel of the patient and in coordination of their care.   Back pain: this is not convincing enough at this time for me to look for deep infection with MRI L spine but will have VERY low threshold to scan him here if this persists

## 2014-03-11 ENCOUNTER — Encounter: Payer: Self-pay | Admitting: Infectious Disease

## 2014-03-15 ENCOUNTER — Ambulatory Visit (INDEPENDENT_AMBULATORY_CARE_PROVIDER_SITE_OTHER): Payer: Self-pay | Admitting: Thoracic Surgery (Cardiothoracic Vascular Surgery)

## 2014-03-15 ENCOUNTER — Encounter: Payer: Self-pay | Admitting: Thoracic Surgery (Cardiothoracic Vascular Surgery)

## 2014-03-15 ENCOUNTER — Ambulatory Visit
Admission: RE | Admit: 2014-03-15 | Discharge: 2014-03-15 | Disposition: A | Discharge status: Home / Self Care | Payer: BC Managed Care – PPO | Source: Ambulatory Visit | Attending: Thoracic Surgery (Cardiothoracic Vascular Surgery) | Admitting: Thoracic Surgery (Cardiothoracic Vascular Surgery)

## 2014-03-15 VITALS — BP 115/61 | HR 94 | Resp 16 | Ht 65.0 in | Wt 173.0 lb

## 2014-03-15 DIAGNOSIS — I351 Nonrheumatic aortic (valve) insufficiency: Secondary | ICD-10-CM

## 2014-03-15 DIAGNOSIS — I358 Other nonrheumatic aortic valve disorders: Secondary | ICD-10-CM

## 2014-03-15 DIAGNOSIS — I5021 Acute systolic (congestive) heart failure: Secondary | ICD-10-CM

## 2014-03-15 DIAGNOSIS — Z9889 Other specified postprocedural states: Secondary | ICD-10-CM

## 2014-03-15 DIAGNOSIS — Z8774 Personal history of (corrected) congenital malformations of heart and circulatory system: Secondary | ICD-10-CM

## 2014-03-15 DIAGNOSIS — Q2112 Patent foramen ovale: Secondary | ICD-10-CM

## 2014-03-15 DIAGNOSIS — I330 Acute and subacute infective endocarditis: Secondary | ICD-10-CM

## 2014-03-15 DIAGNOSIS — Q211 Atrial septal defect: Secondary | ICD-10-CM

## 2014-03-15 DIAGNOSIS — Q210 Ventricular septal defect: Secondary | ICD-10-CM

## 2014-03-15 NOTE — Patient Instructions (Addendum)
The patient should continue to avoid any heavy lifting or strenuous use of arms or shoulders for at least a total of three months from the time of surgery.  

## 2014-03-15 NOTE — Progress Notes (Signed)
[address]       [name] [address]             [phone]     CARDIOTHORACIC SURGERY OFFICE NOTE  Referring Provider is Jens Som, Madolyn Frieze, MD PCP is [name], MD   HPI:  Patient returns to the office today for routine follow-up.  He originally underwent emergency human allograft aortic root replacement by Dr. Tyrone Sage on 12/30/2013 because of Group B Strep bacterial endocarditis complicated by aortic root abscess causing aorta to right atrial fistula with acute combined systolic and diastolic congestive heart failure. He recovered remarkably quickly, although he required permanent pacemaker placement for complete heart block on 01/04/2014.  He was treated with a 6 week course of intravenous high-dose Rocephin and made excellent progress over the next several weeks. 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  He subsequently underwent emergency redo median sternotomy for ventricular septal defect repair, redo tricuspid valve repair, and redo closure of patent foramen ovale on 02/12/2014.  His subsequent recovery after the second operation was uneventful, and he was again discharged from the hospital on home IV antibiotics. He completed 2 weeks of intravenous gentamicin and he remains on high-dose Rocephin at this time. He was seen in follow-up by Dr. Tyrone Sage 03/04/2014 and a follow-up echocardiogram was obtained 03/09/2014 that revealed intact repair of his fistula.  He was seen in follow-up by Dr. Daiva Eves at the infectious disease clinic 03/10/2014.  Although intraoperative cultures obtained at the time of the patient's second operation on 02/12/2014 did not grow any bacteria, tissue was sent off to the [facility] in  [place] where PCR analysis was again positive for group B Streptococcus.  With these findings in mind Dr. Daiva Eves plans for the patient to complete a second 6 week course of high dose intravenous Rocephin, after which time he plans begin oral amoxicillin for "a few months".  The patient returns to the office today for routine follow-up. He states that he feels very well, much better than he has in a long time. In fact, the patient feels as though he never had heart surgery. He has no residual pain in his chest. He has no shortness of breath. He is walking a fair amount every day without any exertional shortness of breath. He feels as though he can do much more, and he hopes to begin exercising more strenuously. He denies any fevers or chills. He has not had any palpitations or dizzy spells. He is sleeping well at night. His appetite is good. Previous symptoms of low back pain have resolved.   Current Outpatient Prescriptions  Medication Sig Dispense Refill  . aspirin EC 325 MG EC tablet Take 1 tablet (325 mg total) by mouth daily. 30 tablet 0  . bismuth subsalicylate (PEPTO BISMOL) 262 MG/15ML suspension Take by mouth every 6 (six) hours as needed for indigestion.    . cefTRIAXone (ROCEPHIN) 40 MG/ML IVPB 2G in 5% Dextrose/ 50ml IVPB--- administer at 136ml/hr every 12 hours for 6 weeks 50 mL   . Multiple Vitamin (MULTIVITAMIN WITH MINERALS) TABS tablet Take 1 tablet by mouth daily.    Marland Kitchen amoxicillin (AMOXIL) 500 MG capsule Take 1 capsule (500 mg total) by mouth 3 (three) times daily. (Patient not  taking: Reported on 03/15/2014) 90 capsule 3   No current facility-administered medications for this visit.      Physical Exam:   BP 115/61 mmHg  Pulse 94  Resp 16  Ht 5\' 5"  (1.651 m)  Wt 173 lb (78.472 kg)  BMI 28.79 kg/m2  SpO2 99%  General:  Well-appearing  Chest:   Clear  CV:   Regular rate and rhythm with soft systolic murmur heard left sternal border  Incisions:  Healing nicely, sternum is  stable  Abdomen:  Soft and nontender  Extremities:  Warm and well-perfused   Diagnostic Tests:  Transthoracic Echocardiography  Patient:  [name], [name] MR #:    [id number] Study Date: 03/08/2014 Gender:   M Age:    28 Height:   165.1 cm Weight:   79.4 kg BSA:    1.93 m^2 Pt. Status: Room:  ATTENDING  Olga Millers [facility] [place] REFERRING  Olga Millers SONOGRAPHER Clearence Ped, RCS PERFORMING  [facility], Outpatient  cc:  ------------------------------------------------------------------- LV EF: 45% -  50%  ------------------------------------------------------------------- Indications:   424.1 Aortic valve disorders.  ------------------------------------------------------------------- History:  PMH: s/p Aortic root and Valve allograft, CHF, closure PFO, Pacemaker, s/p VSD, Tricuspid Valve repair.  ------------------------------------------------------------------- Study Conclusions  - Left ventricle: There was mild concentric hypertrophy. Systolic function was mildly reduced. The estimated ejection fraction was in the range of 45% to 50%. Features are consistent with a pseudonormal left ventricular filling pattern, with concomitant abnormal relaxation and increased filling pressure (grade 2 diastolic dysfunction). - Ventricular septum: Septal motion showed paradox. - Aortic valve: A bioprosthetic valve sits well in the aortic position. There is no paravalvular leak and moderate central regurgitation. Transaortic gradients are higher when compared to the prior study and are at the upper normal border. Mean gradient 18 mmHg. There was moderate regurgitation. Valve area (VTI): 1.4 cm^2. Valve area (Vmax): 1.09 cm^2. Valve area (Vmean): 1.19 cm^2. - Aorta: Aortic root dimension: 33 mm (ED). - Mitral valve: There was mild regurgitation. - Left atrium: The atrium was mildly dilated. - Right  ventricle: Systolic function was mildly reduced. - Tricuspid valve: S/P tricuspid valve repair. Normal tricuspid gradients. - Pulmonary arteries: Systolic pressure was within the normal range. - Inferior vena cava: The vessel was normal in size. The respirophasic diameter changes were in the normal range (= 50%), consistent with normal central venous pressure. - Pericardium, extracardiac: A trivial pericardial effusion was identified.  Impressions:  - Compared to the prior study from 02/11/2014, the left ventricular ejection fraction is mildly impaired estimated at 45-50%. A bioprosthetic valve sits well in the aortic position. There is no paravalvular leak and moderate central regurgitation. Transaortic gradients are higher when compared to the prior study and are at the upper normal border. Mean gradient 18 mmHg. A patch is seen in the membranous portion of the interventricular septum. No flow is seen. Mild left atrial dilatation. S/P tricuspid valve repair with only minimal tricuspid regurgitation and normal RVSP (previously severe TR and severe pulmonary hypertension).  Transthoracic echocardiography. M-mode, complete 2D, spectral Doppler, and color Doppler. Birthdate: Patient birthdate: 10-03-1985. Age: Patient is 29 yr old. Sex: Gender: male. BMI: 29.1 kg/m^2. Blood pressure:   110/80 Patient status: Outpatient. Study date: Study date: 03/08/2014. Study time: 03:25 PM. Location: Echo laboratory.  -------------------------------------------------------------------  ------------------------------------------------------------------- Left ventricle: Mildly decreased LVEF 45-50% with paradoxical septal motion and akinetic distal septum and the apex. There was mild concentric hypertrophy. Systolic function was mildly reduced. The estimated ejection fraction was  in the range of 45% to 50%. Features are consistent with a  pseudonormal left ventricular filling pattern, with concomitant abnormal relaxation and increased filling pressure (grade 2 diastolic dysfunction). There was no evidence of elevated ventricular filling pressure by Doppler parameters.  ------------------------------------------------------------------- Aortic valve: A bioprosthetic valve sits well in the aortic position. There is no paravalvular leak and moderate central regurgitation. Transaortic gradients are higher when compared to the prior study and are at the upper normal border. Mean gradient 18 mmHg. Doppler: There was moderate regurgitation.  VTI ratio of LVOT to aortic valve: 0.45. Valve area (VTI): 1.4 cm^2. Indexed valve area (VTI): 0.73 cm^2/m^2. Valve area (Vmax): 1.09 cm^2. Indexed valve area (Vmax): 0.56 cm^2/m^2. Mean velocity ratio of LVOT to aortic valve: 0.38. Valve area (Vmean): 1.19 cm^2. Indexed valve area (Vmean): 0.62 cm^2/m^2.  Peak gradient (S): 31 mm Hg.  ------------------------------------------------------------------- Mitral valve:  Doppler: There was mild regurgitation.  Peak gradient (D): 3 mm Hg.  ------------------------------------------------------------------- Left atrium: The atrium was mildly dilated.  ------------------------------------------------------------------- Right ventricle: Pacer wire or catheter noted in right ventricle. Systolic function was mildly reduced.  ------------------------------------------------------------------- Ventricular septum:  Septal motion showed paradox.  ------------------------------------------------------------------- Tricuspid valve: S/P tricuspid valve repair. Normal tricuspid gradients. Doppler: There was mild regurgitation.  ------------------------------------------------------------------- Pulmonary artery:  Systolic pressure was within the normal  range.  ------------------------------------------------------------------- Right atrium: Pacer wire or catheter noted in right atrium.  ------------------------------------------------------------------- Pericardium: A trivial pericardial effusion was identified.  ------------------------------------------------------------------- Systemic veins: Inferior vena cava: The vessel was normal in size. The respirophasic diameter changes were in the normal range (= 50%), consistent with normal central venous pressure. Diameter: 17 mm.  ------------------------------------------------------------------- Measurements  IVC                    Value     Reference ID                    17  mm    ---------  Left ventricle              Value     Reference LV ID, ED, PLAX chordal          48  mm    43 - 52 LV ID, ES, PLAX chordal          36.2 mm    23 - 38 LV fx shortening, PLAX chordal  (L)   25  %    >=29 LV PW thickness, ED            13.3 mm    --------- IVS/LV PW ratio, ED            0.83      <=1.3 Stroke volume, 2D             62  ml    --------- Stroke volume/bsa, 2D           32  ml/m^2  --------- LV e&', lateral              13.6 cm/s   --------- LV E/e&', lateral             6.39      --------- LV e&', medial               8.22 cm/s   --------- LV E/e&', medial              10.57     --------- LV e&', average  10.91 cm/s   --------- LV E/e&', average             7.97      ---------  Ventricular septum            Value     Reference IVS thickness, ED             11.1 mm    ---------  LVOT                   Value      Reference LVOT ID, S                20  mm    --------- LVOT area                 3.14 cm^2   --------- LVOT peak velocity, S           96.3 cm/s   --------- LVOT mean velocity, S           67.4 cm/s   --------- LVOT VTI, S                19.6 cm    --------- LVOT peak gradient, S           4   mm Hg  ---------  Aortic valve               Value     Reference Aortic valve mean velocity, S       178  cm/s   --------- Aortic valve VTI, S            44  cm    --------- Aortic peak gradient, S          31  mm Hg  --------- VTI ratio, LVOT/AV            0.45      --------- Aortic valve area, VTI          1.4  cm^2   --------- Aortic valve area/bsa, VTI        0.73 cm^2/m^2 --------- Aortic valve area, peak velocity     1.09 cm^2   --------- Aortic valve area/bsa, peak        0.56 cm^2/m^2 --------- velocity Velocity ratio, mean, LVOT/AV       0.38      --------- Aortic valve area, mean velocity     1.19 cm^2   --------- Aortic valve area/bsa, mean        0.62 cm^2/m^2 --------- velocity Aortic regurg pressure half-time     224  ms    ---------  Aorta                   Value     Reference Aortic root ID, ED            33  mm    ---------  Left atrium                Value     Reference LA ID, A-P, ES              41  mm    --------- LA ID/bsa, A-P              2.12 cm/m^2  <=2.2 LA volume, S               39  ml    --------- LA volume/bsa, S  20.2 ml/m^2  --------- LA volume, ES, 1-p A4C          30  ml    --------- LA volume/bsa, ES, 1-p A4C        15.5  ml/m^2  --------- LA volume, ES, 1-p A2C          49  ml    --------- LA volume/bsa, ES, 1-p A2C        25.4 ml/m^2  ---------  Mitral valve               Value     Reference Mitral E-wave peak velocity        86.9 cm/s   --------- Mitral A-wave peak velocity        57.3 cm/s   --------- Mitral deceleration time         162  ms    150 - 230 Mitral peak gradient, D          3   mm Hg  --------- Mitral E/A ratio, peak          1.5      ---------  Pulmonary arteries            Value     Reference PA pressure, S, DP            15  mm Hg  <=30  Tricuspid valve              Value     Reference Tricuspid regurg peak velocity      172  cm/s   --------- Tricuspid peak RV-RA gradient       12  mm Hg  --------- Tricuspid maximal regurg         172  cm/s   --------- velocity, PISA  Systemic veins              Value     Reference Estimated CVP               3   mm Hg  ---------  Right ventricle              Value     Reference RV pressure, S, DP            15  mm Hg  <=30 RV s&', lateral, S             8.33 cm/s   ---------  Legend: (L) and (H) mark values outside specified reference range.  ------------------------------------------------------------------- Prepared and Electronically Authenticated by  Tobias Alexander, M.D. 2016-01-25T19:43:34     CHEST 2 VIEW  COMPARISON: 03/04/2014  FINDINGS: Dual lead pacer remains in place. Right PICC line tip: SVC.  Cardiothoracic index 53% on the PA radiography, compatible with mild cardiomegaly, and stable from prior. No edema or pleural effusion. Stable right middle lobe scarring. No malalignment or specific abnormality in the  visualized portion of the thoracic and upper lumbar spine.  IMPRESSION: 1. Stable bandlike opacities in the right middle lobe best appreciated on the lateral projection, favoring scarring given the chronicity of findings. 2. Mild cardiomegaly, stable. 3. No additional significant findings.   Electronically Signed  By: Herbie Baltimore M.D.  On: 03/15/2014 15:35   Impression:  Patient appears to be doing remarkably well approximately one month following emergency redo median sternotomy for bovine pericardial patch repair of ventricular septal defect related to breakdown of previous closure of aorta to right atrial fistula with redo tricuspid valve repair and redo closure of  patent foramen ovale, all arising in the setting of complicated group B Streptococcus bacterial endocarditis.    Plan:  I have encouraged the patient to continue to gradually increase his physical activity as tolerated with his primary limitations at this point related to the fact that the patient should continue to avoid any heavy lifting or strenuous use of his arms or shoulders for at least another 2 months. I do not feel that the patient should consider going back to work at this time but I have suggested that he might enjoy participation in outpatient cardiac rehabilitation program. I think he may resume driving an automobile. We have not made any changes to his current medications.  The patient will return for follow-up to see Dr. Tyrone Sage as previously arranged. He will continue to follow-up with Dr. Jens Som at [facility] and Dr. Daiva Eves in the infectious disease clinic.  All of his questions been addressed.    Salvatore Decent. Cornelius Moras, MD 03/15/2014 3:50 PM

## 2014-03-25 ENCOUNTER — Ambulatory Visit: Payer: BC Managed Care – PPO | Admitting: Cardiothoracic Surgery

## 2014-03-30 ENCOUNTER — Telehealth: Payer: Self-pay | Admitting: *Deleted

## 2014-03-30 NOTE — Telephone Encounter (Signed)
Last dose of Rocephin 04/01/14 per [facility] calculation.  RN wanting to know if PICC line can be removed after the oast dose of Rocephin on 04/01/14.  MD please advise.

## 2014-03-31 ENCOUNTER — Encounter: Payer: Self-pay | Admitting: Infectious Disease

## 2014-03-31 ENCOUNTER — Other Ambulatory Visit: Payer: Self-pay | Admitting: Cardiothoracic Surgery

## 2014-03-31 DIAGNOSIS — Z8774 Personal history of (corrected) congenital malformations of heart and circulatory system: Secondary | ICD-10-CM

## 2014-03-31 NOTE — Telephone Encounter (Signed)
Perfect

## 2014-03-31 NOTE — Telephone Encounter (Signed)
OK to pull PICC line does he have oral antibiotics ready to start after the PICC is pulled?

## 2014-03-31 NOTE — Telephone Encounter (Addendum)
Pt has Amoxicillin rx.  Notified [facility] of stop date and pull PICC orders.  Pt to start Amoxicillin.  Verbalized back by Debbie in [facility].

## 2014-04-01 ENCOUNTER — Ambulatory Visit
Admission: RE | Admit: 2014-04-01 | Discharge: 2014-04-01 | Disposition: A | Discharge status: Home / Self Care | Payer: BC Managed Care – PPO | Source: Ambulatory Visit | Attending: Cardiothoracic Surgery | Admitting: Cardiothoracic Surgery

## 2014-04-01 ENCOUNTER — Encounter: Payer: Self-pay | Admitting: Cardiothoracic Surgery

## 2014-04-01 ENCOUNTER — Encounter (INDEPENDENT_AMBULATORY_CARE_PROVIDER_SITE_OTHER): Payer: Self-pay

## 2014-04-01 ENCOUNTER — Ambulatory Visit (INDEPENDENT_AMBULATORY_CARE_PROVIDER_SITE_OTHER): Payer: Self-pay | Admitting: Cardiothoracic Surgery

## 2014-04-01 VITALS — BP 117/72 | HR 86 | Resp 20 | Ht 65.0 in | Wt 182.0 lb

## 2014-04-01 DIAGNOSIS — I358 Other nonrheumatic aortic valve disorders: Secondary | ICD-10-CM

## 2014-04-01 DIAGNOSIS — Z9889 Other specified postprocedural states: Secondary | ICD-10-CM

## 2014-04-01 DIAGNOSIS — Z8774 Personal history of (corrected) congenital malformations of heart and circulatory system: Secondary | ICD-10-CM

## 2014-04-01 DIAGNOSIS — I351 Nonrheumatic aortic (valve) insufficiency: Secondary | ICD-10-CM

## 2014-04-01 NOTE — Progress Notes (Signed)
[address]       [place] [address]             [phone]      [name] [facility] Medical Record #[id number] Date of Birth: 06/26/1985  Referring: [name], MD Primary Care: [name], MD  Chief Complaint:   POST OP FOLLOW UP Past Surgical History  Procedure Laterality Date  . Ascending aortic root replacement N/A 12/30/2013    Procedure: ASCENDING AORTIC ROOT REPLACEMENT with 56mm HOMOGRAFT, CLOSURE OF AORTIC TO RIGHT ATRIAL FISTULA, CLOSURE OF PATENT FORAMEN OVALE;  Surgeon: Delight Ovens, MD;  Location: [facility] OR;  Service: Open Heart Surgery;  Laterality: N/A;  . Tricuspid valve replacement N/A 12/30/2013    Procedure: REPAIR OF TRICUSPID VALVE LEAFLET;  Surgeon: Delight Ovens, MD;  Location: [facility] OR;  Service: Open Heart Surgery;  Laterality: N/A;  . Intraoperative transesophageal echocardiogram N/A 12/30/2013    Procedure: INTRAOPERATIVE TRANSESOPHAGEAL ECHOCARDIOGRAM;  Surgeon: Delight Ovens, MD;  Location: [facility] OR;  Service: Open Heart Surgery;  Laterality: N/A;  . Permanent pacemaker insertion N/A 01/04/2014    Procedure: PERMANENT PACEMAKER INSERTION;  Surgeon: Marinus Maw, MD;  Location: [facility] CATH LAB;  Service: Cardiovascular;  Laterality: N/A;  . Insert / replace / remove pacemaker  01/04/2014  . Right heart catheterization N/A 02/12/2014    Procedure: RIGHT HEART CATH;  Surgeon: Laurey Morale, MD;  Location: [facility] CATH LAB;  Service: Cardiovascular;  Laterality: N/A;  . Tee without cardioversion N/A 02/12/2014    Procedure: TRANSESOPHAGEAL ECHOCARDIOGRAM (TEE);  Surgeon: Quintella Reichert, MD;  Location: [facility] ENDOSCOPY;  Service: Cardiovascular;  Laterality: N/A;  . 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 He recovered remarkably quickly, although he required permanent pacemaker placement for complete heart block on 01/04/2014. He was treated with a 6 week course of intravenous high-dose Rocephin and made excellent progress over the next several weeks. 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 He  underwent emergency redo median sternotomy for ventricular septal defect repair, redo tricuspid valve repair, and redo closure of patent foramen ovale on 02/12/2014. His subsequent recovery after the second operation was uneventful, and he was again discharged from the hospital on home IV antibiotics. He completed 2 weeks of intravenous gentamicin and he remains on high-dose Rocephin at this time. A follow-up echocardiogram was obtained 03/09/2014 that revealed intact repair of his fistula. He was seen in follow-up by Dr. Daiva Eves at the infectious disease clinic 03/10/2014. Although intraoperative cultures obtained at the time of the patient's second operation on 02/12/2014  did not grow any bacteria, tissue was sent off to the [place] [facility] [place] in [place] where PCR analysis was again positive for group B Streptococcus. With these findings in mind Dr.  Daiva Eves plans for the patient to complete a second 6 week course of high dose intravenous Rocephin, after which time he plans begin oral amoxicillin for "a few months".   He states that he feels very well, much better than he has in a long time. In fact, the patient feels as though he never had heart surgery. He has no residual pain in his chest. He has no shortness of breath. He is walking a fair amount every day without any exertional shortness of breath. He feels as though he can do much more, and he hopes to begin exercising more strenuously. He denies any fevers or chills. He has not had any palpitations or dizzy spells. He is sleeping well at night. His appetite is good. Previous symptoms of low back pain have resolved.       Past Medical History  Diagnosis Date  . Obesity   . Smokeless tobacco use   . Fracture of left femur   . Meningitis     Group B Strep (November 2015)  . Patent foramen ovale     Closed during procedure on December 30, 2013  . History of open heart surgery     December 30, 2013- ascending aortic root replacement, TEE  . Endocarditis     related to meningitis   . Brain abscess   . Presence of permanent cardiac pacemaker 01/04/2014    PPM MEDTRONIC FOR COMPLETE HEART BLOCK  . Thrombocytopenia 12/2013  . AKI (acute kidney injury)   . 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 Tricuspid regurgitation 02/11/2014  . 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 S/P ventricular septal defect repair + redo tricuspid valve repair + redo closure PFO 02/12/2014    Redo sternotomy for bovine pericardial patch repair of large ventricular septal defect (recurrent LVOT to RA fistula due to bacterial endocarditis)  . S/P redo tricuspid valve repair 02/12/2014    Complex valvuloplasty  including patch closure of VSD with plication of septal leaflet and 26 mm Edwards mc3 ring annuloplasty  . S/P redo patent foramen ovale closure 02/12/2014  . S/P placement of cardiac pacemaker 01/04/2014    Medtronic (serial number [id number]) dual chamber permanent pacemaker implanted by Dr Ladona Ridgel  . moderate residual aortic insufficiency s/p allograft aortic root replacement 03/08/2014     History  Smoking status  . Never Smoker   Smokeless tobacco  . Former Neurosurgeon  . Quit date: 12/30/2013    History  Alcohol Use  . Yes     No Known Allergies  Current Outpatient Prescriptions  Medication Sig Dispense Refill  . amoxicillin (AMOXIL) 500 MG capsule Take 1 capsule (500 mg total) by mouth 3 (three) times daily. 90 capsule 3  . aspirin EC 325 MG EC tablet Take 1 tablet (325 mg total) by mouth daily. 30 tablet 0  . bismuth subsalicylate (PEPTO BISMOL) 262 MG/15ML suspension Take by mouth every 6 (six) hours as needed for indigestion.    . Multiple Vitamin (MULTIVITAMIN WITH MINERALS) TABS tablet Take 1 tablet by mouth daily.     No current facility-administered medications for this visit.  Physical Exam: BP 117/72 mmHg  Pulse 86  Resp 20  Ht  (1.651 m)  Wt 182 lb (82.555 kg)  BMI 30.29 kg/m2  SpO2 98%  General appearance: alert and cooperative Neurologic: intact Heart: Since last seen the character of  his murmur has significantly improved , now there is an early systolic ejection murmur I do not appreciate any murmur of aortic insufficiency  Lungs: clear to auscultation bilaterally, do not appreciate any pleural rub Abdomen: soft, non-tender; bowel sounds normal; no masses,  no organomegaly Extremities: extremities normal, atraumatic, no cyanosis or edema and Homans sign is negative, no sign of DVT Wound: Sternum is stable and well-healed He has no flank tenderness on either side He has no pedal edema  Diagnostic Studies & Laboratory data:     Recent  Radiology Findings:   Dg Chest 2 View  04/01/2014   CLINICAL DATA:  29 year old male with personal history of bacterial endocarditis and prior surgery. Subsequent encounter.  EXAM: CHEST  2 VIEW  COMPARISON:  03/15/2014 and earlier.  FINDINGS: Stable left chest cardiac pacemaker. Stable cardiac size and mediastinal contours. Stable lung volumes. Mild streaky perihilar opacity has not significantly changed. No pneumothorax, pulmonary edema, pleural effusion or acute pulmonary opacity. Stable visualized osseous structures. Visualized tracheal air column is within normal limits.  IMPRESSION: Stable mild nonspecific perihilar opacity. No new cardiopulmonary abnormality.   Electronically Signed   By: Odessa Fleming M.D.   On: 04/01/2014 11:12  I have independently reviewed the above radiology studies  and reviewed the findings with the patient.   Echo: 03/09/2014 Study Conclusions  - Left ventricle: There was mild concentric hypertrophy. Systolic function was mildly reduced. The estimated ejection fraction was in the range of 45% to 50%. Features are consistent with a pseudonormal left ventricular filling pattern, with concomitant abnormal relaxation and increased filling pressure (grade 2 diastolic dysfunction). - Ventricular septum: Septal motion showed paradox. - Aortic valve: A bioprosthetic valve sits well in the aortic position. There is no paravalvular leak and moderate central regurgitation. Transaortic gradients are higher when compared to the prior study and are at the upper normal border. Mean gradient 18 mmHg. There was moderate regurgitation. Valve area (VTI): 1.4 cm^2. Valve area (Vmax): 1.09 cm^2. Valve area (Vmean): 1.19 cm^2. - Aorta: Aortic root dimension: 33 mm (ED). - Mitral valve: There was mild regurgitation. - Left atrium: The atrium was mildly dilated. - Right ventricle: Systolic function was mildly reduced. - Tricuspid valve: S/P tricuspid valve repair.  Normal tricuspid gradients. - Pulmonary arteries: Systolic pressure was within the normal range. - Inferior vena cava: The vessel was normal in size. The respirophasic diameter changes were in the normal range (= 50%), consistent with normal central venous pressure. - Pericardium, extracardiac: A trivial pericardial effusion was identified.  Impressions:  - Compared to the prior study from 02/11/2014, the left ventricular ejection fraction is mildly impaired estimated at 45-50%. A bioprosthetic valve sits well in the aortic position. There is no paravalvular leak and moderate central regurgitation. Transaortic gradients are higher when compared to the prior study and are at the upper normal border. Mean gradient 18 mmHg. A patch is seen in the membranous portion of the interventricular septum. No flow is seen. Mild left atrial dilatation. S/P tricuspid valve repair with only minimal tricuspid regurgitation and normal RVSP (previously severe TR and severe pulmonary hypertension).  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 He completed iv antibiotic therapy and starts amoxicillin today. 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 His boss has called and told him  he can return to his job with a pacemaker. 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

## 2014-04-01 NOTE — Progress Notes (Deleted)
[address]       [place] [address]             [phone]      Darel [place]burg [facility] Medical Record #[id number] Date of Birth: 06/26/1985  Referring: [name], MD Primary Care: [name], MD  Chief Complaint:   POST OP FOLLOW UP Past Surgical History  Procedure Laterality Date  . Ascending aortic root replacement N/A 12/30/2013    Procedure: ASCENDING AORTIC ROOT REPLACEMENT with 56mm HOMOGRAFT, CLOSURE OF AORTIC TO RIGHT ATRIAL FISTULA, CLOSURE OF PATENT FORAMEN OVALE;  Surgeon: Delight Ovens, MD;  Location: [facility] OR;  Service: Open Heart Surgery;  Laterality: N/A;  . Tricuspid valve replacement N/A 12/30/2013    Procedure: REPAIR OF TRICUSPID VALVE LEAFLET;  Surgeon: Delight Ovens, MD;  Location: [facility] OR;  Service: Open Heart Surgery;  Laterality: N/A;  . Intraoperative transesophageal echocardiogram N/A 12/30/2013    Procedure: INTRAOPERATIVE TRANSESOPHAGEAL ECHOCARDIOGRAM;  Surgeon: Delight Ovens, MD;  Location: [facility] OR;  Service: Open Heart Surgery;  Laterality: N/A;  . Permanent pacemaker insertion N/A 01/04/2014    Procedure: PERMANENT PACEMAKER INSERTION;  Surgeon: Marinus Maw, MD;  Location: [facility] CATH LAB;  Service: Cardiovascular;  Laterality: N/A;  . Insert / replace / remove pacemaker  01/04/2014  . Right heart catheterization N/A 02/12/2014    Procedure: RIGHT HEART CATH;  Surgeon: Laurey Morale, MD;  Location: [facility] CATH LAB;  Service: Cardiovascular;  Laterality: N/A;  . Tee without cardioversion N/A 02/12/2014    Procedure: TRANSESOPHAGEAL ECHOCARDIOGRAM (TEE);  Surgeon: Quintella Reichert, MD;  Location: [facility] ENDOSCOPY;  Service: Cardiovascular;  Laterality: N/A;  . 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 He recovered remarkably quickly, although he required permanent pacemaker placement for complete heart block on 01/04/2014. He was treated with a 6 week course of intravenous high-dose Rocephin and made excellent progress over the next several weeks. 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 He  underwent emergency redo median sternotomy for ventricular septal defect repair, redo tricuspid valve repair, and redo closure of patent foramen ovale on 02/12/2014. His subsequent recovery after the second operation was uneventful, and he was again discharged from the hospital on home IV antibiotics. He completed 2 weeks of intravenous gentamicin and he remains on high-dose Rocephin at this time. A follow-up echocardiogram was obtained 03/09/2014 that revealed intact repair of his fistula. He was seen in follow-up by Dr. Daiva Eves at the infectious disease clinic 03/10/2014. Although intraoperative cultures obtained at the time of the patient's second operation on 02/12/2014  did not grow any bacteria, tissue was sent off to the [place] [facility] [place] in [place] where PCR analysis was again positive for group B Streptococcus. With these findings in mind Dr.  Daiva Eves plans for the patient to complete a second 6 week course of high dose intravenous Rocephin, after which time he plans begin oral amoxicillin for "a few months".   He states that he feels very well, much better than he has in a long time. In fact, the patient feels as though he never had heart surgery. He has no residual pain in his chest. He has no shortness of breath. He is walking a fair amount every day without any exertional shortness of breath. He feels as though he can do much more, and he hopes to begin exercising more strenuously. He denies any fevers or chills. He has not had any palpitations or dizzy spells. He is sleeping well at night. His appetite is good. Previous symptoms of low back pain have resolved.       Past Medical History  Diagnosis Date  . Obesity   . Smokeless tobacco use   . Fracture of left femur   . Meningitis     Group B Strep (November 2015)  . Patent foramen ovale     Closed during procedure on December 30, 2013  . History of open heart surgery     December 30, 2013- ascending aortic root replacement, TEE  . Endocarditis     related to meningitis   . Brain abscess   . Presence of permanent cardiac pacemaker 01/04/2014    PPM MEDTRONIC FOR COMPLETE HEART BLOCK  . Thrombocytopenia 12/2013  . AKI (acute kidney injury)   . 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 Tricuspid regurgitation 02/11/2014  . 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 S/P ventricular septal defect repair + redo tricuspid valve repair + redo closure PFO 02/12/2014    Redo sternotomy for bovine pericardial patch repair of large ventricular septal defect (recurrent LVOT to RA fistula due to bacterial endocarditis)  . S/P redo tricuspid valve repair 02/12/2014    Complex valvuloplasty  including patch closure of VSD with plication of septal leaflet and 26 mm Edwards mc3 ring annuloplasty  . S/P redo patent foramen ovale closure 02/12/2014  . S/P placement of cardiac pacemaker 01/04/2014    Medtronic (serial number [id number]) dual chamber permanent pacemaker implanted by Dr Ladona Ridgel  . moderate residual aortic insufficiency s/p allograft aortic root replacement 03/08/2014     History  Smoking status  . Never Smoker   Smokeless tobacco  . Former Neurosurgeon  . Quit date: 12/30/2013    History  Alcohol Use  . Yes     No Known Allergies  Current Outpatient Prescriptions  Medication Sig Dispense Refill  . amoxicillin (AMOXIL) 500 MG capsule Take 1 capsule (500 mg total) by mouth 3 (three) times daily. 90 capsule 3  . aspirin EC 325 MG EC tablet Take 1 tablet (325 mg total) by mouth daily. 30 tablet 0  . bismuth subsalicylate (PEPTO BISMOL) 262 MG/15ML suspension Take by mouth every 6 (six) hours as needed for indigestion.    . Multiple Vitamin (MULTIVITAMIN WITH MINERALS) TABS tablet Take 1 tablet by mouth daily.     No current facility-administered medications for this visit.  Physical Exam: BP 117/72 mmHg  Pulse 86  Resp 20  Ht  (1.651 m)  Wt 182 lb (82.555 kg)  BMI 30.29 kg/m2  SpO2 98%  General appearance: alert and cooperative Neurologic: intact Heart: Since last seen the character of her his murmur has significantly improved , now there is an early systolic ejection murmur I do not appreciate any murmur of aortic insufficiency  Lungs: clear to auscultation bilaterally, do not appreciate any pleural rub Abdomen: soft, non-tender; bowel sounds normal; no masses,  no organomegaly Extremities: extremities normal, atraumatic, no cyanosis or edema and Homans sign is negative, no sign of DVT Wound: Sternum is stable and well-healed He has no flank tenderness on either side He has no pedal edema Diagnostic Studies & Laboratory data:     Recent  Radiology Findings:   Dg Chest 2 View  04/01/2014   CLINICAL DATA:  29 year old male with personal history of bacterial endocarditis and prior surgery. Subsequent encounter.  EXAM: CHEST  2 VIEW  COMPARISON:  03/15/2014 and earlier.  FINDINGS: Stable left chest cardiac pacemaker. Stable cardiac size and mediastinal contours. Stable lung volumes. Mild streaky perihilar opacity has not significantly changed. No pneumothorax, pulmonary edema, pleural effusion or acute pulmonary opacity. Stable visualized osseous structures. Visualized tracheal air column is within normal limits.  IMPRESSION: Stable mild nonspecific perihilar opacity. No new cardiopulmonary abnormality.   Electronically Signed   By: Odessa Fleming M.D.   On: 04/01/2014 11:12  I have independently reviewed the above radiology studies  and reviewed the findings with the patient.   Echo: 03/09/2014 Study Conclusions  - Left ventricle: There was mild concentric hypertrophy. Systolic function was mildly reduced. The estimated ejection fraction was in the range of 45% to 50%. Features are consistent with a pseudonormal left ventricular filling pattern, with concomitant abnormal relaxation and increased filling pressure (grade 2 diastolic dysfunction). - Ventricular septum: Septal motion showed paradox. - Aortic valve: A bioprosthetic valve sits well in the aortic position. There is no paravalvular leak and moderate central regurgitation. Transaortic gradients are higher when compared to the prior study and are at the upper normal border. Mean gradient 18 mmHg. There was moderate regurgitation. Valve area (VTI): 1.4 cm^2. Valve area (Vmax): 1.09 cm^2. Valve area (Vmean): 1.19 cm^2. - Aorta: Aortic root dimension: 33 mm (ED). - Mitral valve: There was mild regurgitation. - Left atrium: The atrium was mildly dilated. - Right ventricle: Systolic function was mildly reduced. - Tricuspid valve: S/P tricuspid valve repair.  Normal tricuspid gradients. - Pulmonary arteries: Systolic pressure was within the normal range. - Inferior vena cava: The vessel was normal in size. The respirophasic diameter changes were in the normal range (= 50%), consistent with normal central venous pressure. - Pericardium, extracardiac: A trivial pericardial effusion was identified.  Impressions:  - Compared to the prior study from 02/11/2014, the left ventricular ejection fraction is mildly impaired estimated at 45-50%. A bioprosthetic valve sits well in the aortic position. There is no paravalvular leak and moderate central regurgitation. Transaortic gradients are higher when compared to the prior study and are at the upper normal border. Mean gradient 18 mmHg. A patch is seen in the membranous portion of the interventricular septum. No flow is seen. Mild left atrial dilatation. S/P tricuspid valve repair with only minimal tricuspid regurgitation and normal RVSP (previously severe TR and severe pulmonary hypertension).  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 He continues on IV antibiotics for endocarditis, Plan to see him back in 4 weeks     Delight Ovens MD      [address] [place] [place] [address] Office [phone]   Beeper [phone]  04/01/2014 12:51 PM

## 2014-04-06 ENCOUNTER — Encounter: Payer: Self-pay | Admitting: Cardiology

## 2014-04-06 ENCOUNTER — Ambulatory Visit (INDEPENDENT_AMBULATORY_CARE_PROVIDER_SITE_OTHER): Payer: BC Managed Care – PPO | Admitting: Internal Medicine

## 2014-04-06 ENCOUNTER — Encounter: Payer: Self-pay | Admitting: Internal Medicine

## 2014-04-06 VITALS — BP 100/64 | HR 90 | Ht 65.0 in | Wt 185.8 lb

## 2014-04-06 DIAGNOSIS — I351 Nonrheumatic aortic (valve) insufficiency: Secondary | ICD-10-CM | POA: Diagnosis not present

## 2014-04-06 DIAGNOSIS — B951 Streptococcus, group B, as the cause of diseases classified elsewhere: Secondary | ICD-10-CM

## 2014-04-06 DIAGNOSIS — Z950 Presence of cardiac pacemaker: Secondary | ICD-10-CM

## 2014-04-06 DIAGNOSIS — I442 Atrioventricular block, complete: Secondary | ICD-10-CM | POA: Diagnosis not present

## 2014-04-06 DIAGNOSIS — A491 Streptococcal infection, unspecified site: Secondary | ICD-10-CM

## 2014-04-06 LAB — MDC_IDC_ENUM_SESS_TYPE_INCLINIC
Battery Impedance: 100 Ohm
Battery Remaining Longevity: 129 mo
Battery Voltage: 2.79 V
Brady Statistic AP VP Percent: 1 %
Brady Statistic AP VS Percent: 0 %
Brady Statistic AS VP Percent: 99 %
Date Time Interrogation Session: 20160223132918
Lead Channel Impedance Value: 407 Ohm
Lead Channel Pacing Threshold Amplitude: 0.5 V
Lead Channel Pacing Threshold Amplitude: 0.75 V
Lead Channel Sensing Intrinsic Amplitude: 1.4 mV
Lead Channel Setting Pacing Amplitude: 2 V
Lead Channel Setting Pacing Amplitude: 2.5 V
Lead Channel Setting Pacing Pulse Width: 0.4 ms
MDC IDC MSMT LEADCHNL RA PACING THRESHOLD PULSEWIDTH: 0.4 ms
MDC IDC MSMT LEADCHNL RV IMPEDANCE VALUE: 517 Ohm
MDC IDC MSMT LEADCHNL RV PACING THRESHOLD PULSEWIDTH: 0.4 ms
MDC IDC SET LEADCHNL RV SENSING SENSITIVITY: 4 mV
MDC IDC STAT BRADY AS VS PERCENT: 0 %

## 2014-04-06 NOTE — Assessment & Plan Note (Signed)
His infection appears to be suppressed at this point, and hopefully is cured. He is asymptomatic. He will continue antibiotics under the direction of the infectious disease specialist.

## 2014-04-06 NOTE — Progress Notes (Signed)
HPI [name] returns today for pacemaker follow-up. He is a very pleasant 29 year old man with a complex past medical history including group B strep infection, resulting in endocarditis and a ventricular septal defect, who underwent surgical repair initially in November, complicated by complete heart block. He developed recurrent infection with dehiscent's of his repair and presented in January. He underwent revision and postoperatively had atrial fibrillation. He ultimately improved and recovered and has been maintained on a long course of intravenous antibiotic therapy. He denies fevers or chills. He is on oral amoxicillin. He denies palpitations. No peripheral edema. He is interested in increasing his physical activity. No Known Allergies   Current Outpatient Prescriptions  Medication Sig Dispense Refill  . amoxicillin (AMOXIL) 500 MG capsule Take 1 capsule (500 mg total) by mouth 3 (three) times daily. 90 capsule 3  . aspirin EC 325 MG EC tablet Take 1 tablet (325 mg total) by mouth daily. 30 tablet 0  . bismuth subsalicylate (PEPTO BISMOL) 262 MG/15ML suspension Take by mouth every 6 (six) hours as needed for indigestion.    . Multiple Vitamin (MULTIVITAMIN WITH MINERALS) TABS tablet Take 1 tablet by mouth daily.     No current facility-administered medications for this visit.     Past Medical History  Diagnosis Date  . Obesity   . Smokeless tobacco use   . Fracture of left femur   . Meningitis     Group B Strep (November 2015)  . Patent foramen ovale     Closed during procedure on December 30, 2013  . History of open heart surgery     December 30, 2013- ascending aortic root replacement, TEE  . Endocarditis     related to meningitis   . Brain abscess   . Presence of permanent cardiac pacemaker 01/04/2014    PPM MEDTRONIC FOR COMPLETE HEART BLOCK  . Thrombocytopenia 12/2013  . AKI (acute kidney injury)   . S/P aortic root and valve allograft 12/30/2013    Human  allograft aortic root replacement with repair of aorta-right atrial fistula and tricuspid valve repair  . Tricuspid regurgitation 02/11/2014  . 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 S/P ventricular septal defect repair + redo tricuspid valve repair + redo closure PFO 02/12/2014    Redo sternotomy for bovine pericardial patch repair of large ventricular septal defect (recurrent LVOT to RA fistula due to bacterial endocarditis)  . S/P redo tricuspid valve repair 02/12/2014    Complex valvuloplasty including patch closure of VSD with plication of septal leaflet and 26 mm Edwards mc3 ring annuloplasty  . S/P redo patent foramen ovale closure 02/12/2014  . S/P placement of cardiac pacemaker 01/04/2014    Medtronic (serial number [id number]) dual chamber permanent pacemaker implanted by Dr Ladona Ridgel  . moderate residual aortic insufficiency s/p allograft aortic root replacement 03/08/2014    ROS:   All systems reviewed and negative except as noted in the HPI.   Past Surgical History  Procedure Laterality Date  . Ascending aortic root replacement N/A 12/30/2013    Procedure: ASCENDING AORTIC ROOT REPLACEMENT with 23mm HOMOGRAFT, CLOSURE OF AORTIC TO RIGHT ATRIAL FISTULA, CLOSURE OF PATENT FORAMEN OVALE;  Surgeon: Delight Ovens, MD;  Location: [facility] OR;  Service: Open Heart Surgery;  Laterality: N/A;  . Tricuspid valve replacement N/A 12/30/2013    Procedure: REPAIR OF TRICUSPID VALVE LEAFLET;  Surgeon: Delight Ovens, MD;  Location:  [facility] OR;  Service: Open Heart Surgery;  Laterality: N/A;  . Intraoperative transesophageal echocardiogram N/A 12/30/2013    Procedure: INTRAOPERATIVE TRANSESOPHAGEAL ECHOCARDIOGRAM;  Surgeon: Delight Ovens, MD;  Location: [facility] OR;  Service: Open Heart Surgery;  Laterality: N/A;  . Permanent pacemaker insertion N/A 01/04/2014    Procedure: PERMANENT PACEMAKER INSERTION;  Surgeon: Marinus Maw,  MD;  Location: [facility] CATH LAB;  Service: Cardiovascular;  Laterality: N/A;  . Insert / replace / remove pacemaker  01/04/2014  . Right heart catheterization N/A 02/12/2014    Procedure: RIGHT HEART CATH;  Surgeon: Laurey Morale, MD;  Location[facility] CATH LAB;  Service: Cardiovascular;  Laterality: N/A;  . Tee without cardioversion N/A 02/12/2014    Procedure: TRANSESOPHAGEAL ECHOCARDIOGRAM (TEE);  Surgeon: Quintella Reichert, MD;  Location: [facility] ENDOSCOPY;  Service: Cardiovascular;  Laterality: N/A;  . Tricuspid valve replacement N/A 02/12/2014    Procedure: TRICUSPID VALVE REPAIR, REDO MEDIAN STERNOTOMY, REPAIR OF VENTRICULAR SEPTAL DEFECT (RECURRENT LV OUTFLOW TRACT TO RIGHT ATRIAL FISTULA), REDO CLOSURE OF PATENT FORAMEN OVALE;  Surgeon: Purcell Nails, MD;  Location: [facility] OR;  Service: Open Heart Surgery;  Laterality: N/A;     Family History  Problem Relation Age of Onset  . Heart murmur Brother      History   Social History  . Marital Status: Single    Spouse Name: N/A  . Number of Children: N/A  . Years of Education: N/A   Occupational History  . Not on file.   Social History Main Topics  . Smoking status: Never Smoker   . Smokeless tobacco: Former Neurosurgeon    Quit date: 12/30/2013  . Alcohol Use: Yes  . Drug Use: No  . Sexual Activity: Not on file   Other Topics Concern  . Not on file   Social History Narrative     BP 100/64 mmHg  Pulse 90  Ht 5\' 5"  (1.651 m)  Wt 185 lb 12.8 oz (84.278 kg)  BMI 30.92 kg/m2  Physical Exam:  Well appearing 29 year old man, NAD HEENT: Unremarkable Neck:  No JVD, no thyromegally Lymphatics:  No adenopathy Back:  No CVA tenderness Lungs:  Clear, with no wheezes, rales, or rhonchi. HEART:  Regular rate rhythm, no murmurs, no rubs, no clicks Abd:  soft, positive bowel sounds, no organomegally, no rebound, no guarding Ext:  2 plus pulses, no edema, no cyanosis, no clubbing Skin:  No rashes no nodules Neuro:  CN II through XII intact, motor  grossly intact   DEVICE  Normal device function.  See PaceArt for details.   Assess/Plan:

## 2014-04-06 NOTE — Assessment & Plan Note (Signed)
His Medtronic dual-chamber pacemaker is working normally. We'll plan to recheck in several months. 

## 2014-04-06 NOTE — Assessment & Plan Note (Signed)
On physical exam, he has no evidence of regurgitant lesions. He will undergo watchful waiting.

## 2014-04-06 NOTE — Patient Instructions (Signed)
Your physician wants you to follow-up in: 9 months with Dr. Taylor You will receive a reminder letter in the mail two months in advance. If you don't receive a letter, please call our office to schedule the follow-up appointment.  Remote monitoring is used to monitor your Pacemaker of ICD from home. This monitoring reduces the number of office visits required to check your device to one time per year. It allows us to keep an eye on the functioning of your device to ensure it is working properly. You are scheduled for a device check from home on 07/06/14. You may send your transmission at any time that day. If you have a wireless device, the transmission will be sent automatically. After your physician reviews your transmission, you will receive a postcard with your next transmission date.    

## 2014-04-06 NOTE — Progress Notes (Signed)
Received this from Dr. Tyrone Sage regarding patient:  "[name] and [name] this information concerns patient returning to work from Dr Alto [place] for your information    Hi [name],    I received your notes about Mr. [name].  He is indeed a lucky young man to have had such a good outcome.    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  Here are my concerns.    1) Mild Health Failure: The criteria for unrestricted duty in the presence of heart failure remains the previous cited value of 12  METS obtained in a routine Bruce protocol treadmill exercise test.    2) LEOs with complete heart block should be treated with a pacemaker. Pacemakers restrict LEOs from  critical job functions due to risk of pacemaker damage during altercations or pacemaker malfunction.  [phone]    Having said that, I think the first step in the return to work process is a discussion about what are his essential job functions.  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   If he does perform patrol duties, and there is the potential for altercation, then he should apply ask his employer for "reasonable accommodation" given his medical status.  Reasonable accommodation might be shift in duties to some kind of desk job.     Hope this helps. I am glad to answer any other questions you or Mr. Brintnall may have.    Kathrine Haddock MD, MPH, MRO  [facility]  Employer Health Services  Medical Director  Direct Dial: [phone]  Fax: [phone]  Website: [email or web address] "   Albert Millers MD

## 2014-04-09 ENCOUNTER — Encounter: Payer: Self-pay | Admitting: Infectious Disease

## 2014-04-15 ENCOUNTER — Telehealth: Payer: Self-pay | Admitting: Internal Medicine

## 2014-04-15 NOTE — Telephone Encounter (Signed)
New Msg        Pt calling, would like to know if he can go back to work?   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   Please return pt call.

## 2014-04-15 NOTE — Telephone Encounter (Signed)
Discussed with Dr Ladona Ridgel and he says he can return to work from a pacemaker perspective.  Patient aware and will get Dr Dennie Maizes opinion on returning

## 2014-04-22 ENCOUNTER — Ambulatory Visit: Payer: BC Managed Care – PPO | Admitting: Cardiothoracic Surgery

## 2014-04-26 ENCOUNTER — Encounter: Payer: Self-pay | Admitting: Infectious Disease

## 2014-04-27 ENCOUNTER — Encounter: Payer: Self-pay | Admitting: Internal Medicine

## 2014-04-28 ENCOUNTER — Telehealth: Payer: Self-pay | Admitting: Cardiology

## 2014-04-28 DIAGNOSIS — R011 Cardiac murmur, unspecified: Secondary | ICD-10-CM

## 2014-04-28 NOTE — Telephone Encounter (Signed)
Please call,seem like he is detecting a heart mumur again.[name] He said Dr Humberto Seals office said he needs an echo, Dr Sena Hitch said he should have it tomorrow.[name]

## 2014-04-28 NOTE — Telephone Encounter (Signed)
Spoke with pt, echo scheduled. 

## 2014-04-29 ENCOUNTER — Inpatient Hospital Stay (HOSPITAL_COMMUNITY)
Admission: AD | Admit: 2014-04-29 | Discharge: 2014-05-03 | DRG: 314 | Disposition: A | Discharge status: Home Health | Payer: BC Managed Care – PPO | Source: Ambulatory Visit | Attending: Cardiology | Admitting: Cardiology

## 2014-04-29 ENCOUNTER — Telehealth: Payer: Self-pay | Admitting: Cardiology

## 2014-04-29 ENCOUNTER — Encounter (HOSPITAL_COMMUNITY): Payer: Self-pay

## 2014-04-29 ENCOUNTER — Ambulatory Visit (HOSPITAL_BASED_OUTPATIENT_CLINIC_OR_DEPARTMENT_OTHER)
Admission: RE | Admit: 2014-04-29 | Discharge: 2014-04-29 | Disposition: A | Discharge status: Home / Self Care | Payer: BC Managed Care – PPO | Source: Ambulatory Visit | Attending: Cardiology | Admitting: Cardiology

## 2014-04-29 ENCOUNTER — Inpatient Hospital Stay (HOSPITAL_COMMUNITY): Payer: BC Managed Care – PPO

## 2014-04-29 DIAGNOSIS — I358 Other nonrheumatic aortic valve disorders: Secondary | ICD-10-CM | POA: Diagnosis present

## 2014-04-29 DIAGNOSIS — Z6829 Body mass index (BMI) 29.0-29.9, adult: Secondary | ICD-10-CM | POA: Diagnosis not present

## 2014-04-29 DIAGNOSIS — I339 Acute and subacute endocarditis, unspecified: Secondary | ICD-10-CM

## 2014-04-29 DIAGNOSIS — I38 Endocarditis, valve unspecified: Secondary | ICD-10-CM | POA: Diagnosis not present

## 2014-04-29 DIAGNOSIS — Z8661 Personal history of infections of the central nervous system: Secondary | ICD-10-CM

## 2014-04-29 DIAGNOSIS — Z954 Presence of other heart-valve replacement: Secondary | ICD-10-CM

## 2014-04-29 DIAGNOSIS — Z950 Presence of cardiac pacemaker: Secondary | ICD-10-CM

## 2014-04-29 DIAGNOSIS — I083 Combined rheumatic disorders of mitral, aortic and tricuspid valves: Secondary | ICD-10-CM | POA: Diagnosis present

## 2014-04-29 DIAGNOSIS — I330 Acute and subacute infective endocarditis: Secondary | ICD-10-CM | POA: Diagnosis present

## 2014-04-29 DIAGNOSIS — R011 Cardiac murmur, unspecified: Secondary | ICD-10-CM

## 2014-04-29 DIAGNOSIS — Z8249 Family history of ischemic heart disease and other diseases of the circulatory system: Secondary | ICD-10-CM | POA: Diagnosis not present

## 2014-04-29 DIAGNOSIS — Z953 Presence of xenogenic heart valve: Secondary | ICD-10-CM

## 2014-04-29 DIAGNOSIS — T826XXA Infection and inflammatory reaction due to cardiac valve prosthesis, initial encounter: Principal | ICD-10-CM | POA: Diagnosis present

## 2014-04-29 DIAGNOSIS — E669 Obesity, unspecified: Secondary | ICD-10-CM | POA: Diagnosis present

## 2014-04-29 DIAGNOSIS — I340 Nonrheumatic mitral (valve) insufficiency: Secondary | ICD-10-CM | POA: Diagnosis not present

## 2014-04-29 DIAGNOSIS — Z952 Presence of prosthetic heart valve: Secondary | ICD-10-CM | POA: Diagnosis not present

## 2014-04-29 HISTORY — DX: Acute and subacute infective endocarditis: I33.0

## 2014-04-29 LAB — CBC WITH DIFFERENTIAL/PLATELET
Basophils Absolute: 0 10*3/uL (ref 0.0–0.1)
Basophils Relative: 1 % (ref 0–1)
Eosinophils Absolute: 0.2 10*3/uL (ref 0.0–0.7)
Eosinophils Relative: 3 % (ref 0–5)
HCT: 39.1 % (ref 39.0–52.0)
Hemoglobin: 12.8 g/dL — ABNORMAL LOW (ref 13.0–17.0)
Lymphocytes Relative: 20 % (ref 12–46)
Lymphs Abs: 1.7 10*3/uL (ref 0.7–4.0)
MCH: 25.5 pg — ABNORMAL LOW (ref 26.0–34.0)
MCHC: 32.7 g/dL (ref 30.0–36.0)
MCV: 78 fL (ref 78.0–100.0)
Monocytes Absolute: 0.8 10*3/uL (ref 0.1–1.0)
Monocytes Relative: 9 % (ref 3–12)
Neutro Abs: 5.5 10*3/uL (ref 1.7–7.7)
Neutrophils Relative %: 67 % (ref 43–77)
Platelets: 274 10*3/uL (ref 150–400)
RBC: 5.01 MIL/uL (ref 4.22–5.81)
RDW: 15.8 % — ABNORMAL HIGH (ref 11.5–15.5)
WBC: 8.2 10*3/uL (ref 4.0–10.5)

## 2014-04-29 LAB — COMPREHENSIVE METABOLIC PANEL
ALT: 21 U/L (ref 0–53)
AST: 18 U/L (ref 0–37)
Albumin: 4.1 g/dL (ref 3.5–5.2)
Alkaline Phosphatase: 62 U/L (ref 39–117)
Anion gap: 9 (ref 5–15)
BUN: 20 mg/dL (ref 6–23)
CO2: 25 mmol/L (ref 19–32)
Calcium: 9.6 mg/dL (ref 8.4–10.5)
Chloride: 105 mmol/L (ref 96–112)
Creatinine, Ser: 1 mg/dL (ref 0.50–1.35)
GFR calc Af Amer: >90 mL/min (ref >90)
GFR calc non Af Amer: >90 mL/min (ref >90)
Glucose, Bld: 92 mg/dL (ref 70–99)
Potassium: 4 mmol/L (ref 3.5–5.1)
Sodium: 139 mmol/L (ref 135–145)
Total Bilirubin: 0.6 mg/dL (ref 0.3–1.2)
Total Protein: 7.2 g/dL (ref 6.0–8.3)

## 2014-04-29 LAB — URINALYSIS, ROUTINE W REFLEX MICROSCOPIC
Bilirubin Urine: NEGATIVE
Glucose, UA: NEGATIVE mg/dL
Hgb urine dipstick: NEGATIVE
Ketones, ur: NEGATIVE mg/dL
Leukocytes, UA: NEGATIVE
Nitrite: NEGATIVE
Protein, ur: NEGATIVE mg/dL
Specific Gravity, Urine: 1.023 (ref 1.005–1.030)
Urobilinogen, UA: 0.2 mg/dL (ref 0.0–1.0)
pH: 6 (ref 5.0–8.0)

## 2014-04-29 MED ORDER — ASPIRIN EC 81 MG PO TBEC: 81.0000 mg | DELAYED_RELEASE_TABLET | Freq: Every day | ORAL | Status: DC

## 2014-04-29 MED ORDER — BISMUTH SUBSALICYLATE 262 MG/15ML PO SUSP
30.0000 mL | Freq: Four times a day (QID) | ORAL | Status: DC | PRN
  Filled 2014-04-29: qty 118

## 2014-04-29 MED ORDER — ONDANSETRON HCL 4 MG/2ML IJ SOLN
4.0000 mg | Freq: Four times a day (QID) | INTRAMUSCULAR | Status: DC | PRN
Start: 2014-04-29 — End: 2014-05-03

## 2014-04-29 MED ORDER — VANCOMYCIN HCL IN DEXTROSE 1-5 GM/200ML-% IV SOLN
1000.0000 mg | Freq: Three times a day (TID) | INTRAVENOUS | Status: DC
  Administered 2014-04-29 – 2014-05-03 (×12): 1000 mg via INTRAVENOUS
  Filled 2014-04-29 (×14): qty 200

## 2014-04-29 MED ORDER — ADULT MULTIVITAMIN W/MINERALS CH
1.0000 | ORAL_TABLET | Freq: Every day | ORAL | Status: DC
  Administered 2014-05-01 – 2014-05-03 (×3): 1 via ORAL
  Filled 2014-04-29 (×4): qty 1

## 2014-04-29 MED ORDER — ZOLPIDEM TARTRATE 5 MG PO TABS
5.0000 mg | ORAL_TABLET | Freq: Every evening | ORAL | Status: DC | PRN
  Administered 2014-04-29 – 2014-05-03 (×5): 5 mg via ORAL
  Filled 2014-04-29 (×5): qty 1

## 2014-04-29 MED ORDER — SODIUM CHLORIDE 0.9 % IJ SOLN: 10.0000 mL | INTRAMUSCULAR | Status: DC | PRN

## 2014-04-29 MED ORDER — ACETAMINOPHEN 325 MG PO TABS: 650.0000 mg | ORAL_TABLET | ORAL | Status: DC | PRN

## 2014-04-29 MED ORDER — CEFEPIME HCL 2 G IJ SOLR
2.0000 g | Freq: Two times a day (BID) | INTRAMUSCULAR | Status: DC
  Administered 2014-04-29: 2 g via INTRAVENOUS
  Filled 2014-04-29 (×3): qty 2

## 2014-04-29 MED ORDER — SODIUM CHLORIDE 0.9 % IJ SOLN
10.0000 mL | Freq: Two times a day (BID) | INTRAMUSCULAR | Status: DC
  Administered 2014-04-30 – 2014-05-03 (×6): 10 mL

## 2014-04-29 MED ORDER — ASPIRIN EC 325 MG PO TBEC
325.0000 mg | DELAYED_RELEASE_TABLET | Freq: Every day | ORAL | Status: DC
  Administered 2014-05-01 – 2014-05-03 (×3): 325 mg via ORAL
  Filled 2014-04-29 (×4): qty 1

## 2014-04-29 MED ORDER — GENTAMICIN IN SALINE 1.6-0.9 MG/ML-% IV SOLN
80.0000 mg | Freq: Two times a day (BID) | INTRAVENOUS | Status: DC
  Administered 2014-04-29: 80 mg via INTRAVENOUS
  Filled 2014-04-29 (×3): qty 50

## 2014-04-29 MED ORDER — HEPARIN SODIUM (PORCINE) 5000 UNIT/ML IJ SOLN
5000.0000 [IU] | Freq: Three times a day (TID) | INTRAMUSCULAR | Status: DC
  Administered 2014-04-29 – 2014-05-03 (×11): 5000 [IU] via SUBCUTANEOUS
  Filled 2014-04-29 (×11): qty 1

## 2014-04-29 MED ORDER — ALPRAZOLAM 0.25 MG PO TABS: 0.2500 mg | ORAL_TABLET | Freq: Two times a day (BID) | ORAL | Status: DC | PRN

## 2014-04-29 NOTE — Consult Note (Signed)
[facility]  Reason for Consult: Persistent/recurrent aortic valve endocarditis Referring Physician: Dr. Olga Millers  Patient Active Problem List   Diagnosis Date Noted  . Aortic valve vegetation on echo 04/29/14 04/29/2014  . Lower back pain 03/10/2014  . residual aortic insufficiency s/p allograft aortic root replacement 03/08/2014  . Group B streptococcal infection   . S/P ventricular septal defect repair + redo tricuspid valve repair + redo closure PFO 02/12/2014  . S/P redo tricuspid valve repair 02/12/2014  . S/P redo patent foramen ovale closure 02/12/2014  . Dyspnea   . Pacemaker- MDT Nov 2015 02/11/2014  . Dyspnea on exertion 02/11/2014  . S/P placement of cardiac pacemaker 01/04/2014  . Third degree heart block 12/31/2013  . Endocarditis of aortic valve-November 2015   . S/P aortic root and valve allograft Nov 2015with re do surgery Jan 1st 2016 12/30/2013  . Bacterial endocarditis   . Bilateral edema of lower extremity   . Sepsis due to group B Streptococcus   . LFT elevation   . Headache   . Pain in joint, lower leg   . Dehydration     Current Discharge Medication List    CONTINUE these medications which have NOT CHANGED   Details  amoxicillin (AMOXIL) 500 MG capsule Take 1 capsule (500 mg total) by mouth 3 (three) times daily. Qty: 90 capsule, Refills: 3    aspirin EC 325 MG EC tablet Take 1 tablet (325 mg total) by mouth daily. Qty: 30 tablet, Refills: 0    bismuth subsalicylate (PEPTO BISMOL) 262 MG/15ML suspension Take by mouth every 6 (six) hours as needed for indigestion.    Multiple Vitamin (MULTIVITAMIN WITH MINERALS) TABS tablet Take 1 tablet by mouth daily.        Recommendations: 1. Start IV vancomycin, gentamicin and cefepime pending culture results   Assessment: Unfortunately, it appears that Mr. Daubert has developed recurrent aortic valve endocarditis. It is unclear if this is due to persistent  group B streptococcal infection or whether or not he now has infection with other pathogens. He has had blood cultures this afternoon. I will start him on broader therapy with vancomycin, gentamicin and cefepime tonight.   HPI: [name] is a 29 y.o. male who was hospitalized last November with group B streptococcal aortic valve endocarditis complicated by cerebral emboli and meningitis. He was treated with IV ceftriaxone and gentamicin initially. He eventually required aortic root replacement and bioprosthetic aortic valve replacement and tricuspid valve repair. Dr. 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 He's been followed by my partner, Dr. Daiva Eves. The initial plan was to continue IV ceftriaxone for a total of 8 weeks but he began to develop increasing shortness of breath and was readmitted to the hospital on New Year's Eve. Echocardiogram revealed a left to right shunt and severe tricuspid regurgitation with right-sided heart failure. He required an emergent surgery with the finding of a large abscess cavity. He had VSD repair, redo closure of a patent foramen ovale, and tricuspid valve repair. Admission blood cultures and tissue cultures from surgery were all negative but group B streptococcal DNA was detected on operative tissue. He was treated again with ceftriaxone and a short course of gentamicin. He completed that on 03/31/2014 in transition to oral amoxicillin. He has not been missing any doses of his amoxicillin.  He has been feeling much better. He has not had any fever, chills  or sweats. His appetite has returned and he has regained all the weight he lost last fall. 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 2 days ago his significant other was resting her head on his chest and thought she heard a new heart murmur. She listened with a stethoscope and was convinced that she was right. He underwent a repeat echocardiogram today which reveals a new vegetation on  the aortic valve.  Review of Systems: Pertinent items are noted in HPI.  Melene Muller ON 04/30/2014] aspirin EC  325 mg Oral Daily  . heparin  5,000 Units Subcutaneous 3 times per day  . [START ON 04/30/2014] multivitamin with minerals  1 tablet Oral Daily    Past Medical History  Diagnosis Date  . Obesity   . Smokeless tobacco use   . Fracture of left femur   . Meningitis     Group B Strep (November 2015)  . Patent foramen ovale     Closed during procedure on December 30, 2013  . History of open heart surgery     December 30, 2013- ascending aortic root replacement, TEE  . Endocarditis     related to meningitis   . Brain abscess   . Presence of permanent cardiac pacemaker 01/04/2014    PPM MEDTRONIC FOR COMPLETE HEART BLOCK  . Thrombocytopenia 12/2013  . AKI (acute kidney injury)   . 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 Tricuspid regurgitation 02/11/2014  . 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 S/P ventricular septal defect repair + redo tricuspid valve repair + redo closure PFO 02/12/2014    Redo sternotomy for bovine pericardial patch repair of large ventricular septal defect (recurrent LVOT to RA fistula due to bacterial endocarditis)  . S/P redo tricuspid valve repair 02/12/2014    Complex valvuloplasty including patch closure of VSD with plication of septal leaflet and 26 mm Edwards mc3 ring annuloplasty  . S/P redo patent foramen ovale closure 02/12/2014  . S/P placement of cardiac pacemaker 01/04/2014    Medtronic (serial number [id number]) dual chamber permanent pacemaker implanted by Dr Ladona Ridgel  . moderate residual aortic insufficiency s/p allograft aortic root replacement 03/08/2014    History  Substance Use Topics  . Smoking status: Never Smoker   . Smokeless tobacco:  Former Neurosurgeon    Quit date: 12/30/2013  . Alcohol Use: Yes    Family History  Problem Relation Age of Onset  . Heart murmur Brother    No Known Allergies  OBJECTIVE: Blood pressure 123/74, pulse 116, temperature 98.3 F (36.8 C), temperature source Oral, resp. rate 20, height 5\' 5"  (1.651 m), weight 181 lb 12.8 oz (82.464 kg), SpO2 99 %. General: he appears quite healthy and in no distress Skin: no rash, splinter or conjunctival hemorrhages Lungs: clear Cor: regular S1 and S2 with a 2/6 systolic murmur Chest: The left anterior chest pacemaker site looks normal. Sternotomy incision is well-healed Abdomen: soft and nontender Joints and extremities: Normal  Microbiology: No results found for this or any previous visit (from the past 240 hour(s)).  Cliffton Asters, MD [facility] [phone] pager   [phone] cell 04/29/2014, 5:56 PM

## 2014-04-29 NOTE — Progress Notes (Addendum)
2D Echocardiogram Complete.  04/29/2014   Farrel Conners, RDCS   Preliminary Technician Findings:  There is a 1.7 x 1.3 cm Vegitation on the Aortic Valve Homograft, with Aortic insufficiency.  An Aortic Abcess cannot be ruled out by transthoracic echo.  I have consulted with Dr. Royann Shivers, who is following up with Dr. Jens Som for further evaluation via TEE and labs.

## 2014-04-29 NOTE — Progress Notes (Signed)
Peripherally Inserted Central Catheter/Midline Placement  The IV Nurse has discussed with the patient and/or persons authorized to consent for the patient, the purpose of this procedure and the potential benefits and risks involved with this procedure.  The benefits include less needle sticks, lab draws from the catheter and patient may be discharged home with the catheter.  Risks include, but not limited to, infection, bleeding, blood clot (thrombus formation), and puncture of an artery; nerve damage and irregular heat beat.  Alternatives to this procedure were also discussed.  PICC/Midline Placement Documentation  PICC / Midline Single Lumen 01/13/14 PICC Right Brachial 38 cm 0 cm (Active)     PICC / Midline Double Lumen 04/29/14 PICC Right Basilic 43 cm 0 cm (Active)  Indication for Insertion or Continuance of Line Limited venous access - need for IV therapy >5 days (PICC only) 04/29/2014  8:40 PM  Exposed Catheter (cm) 0 cm 04/29/2014  8:40 PM  Site Assessment Clean;Dry;Intact 04/29/2014  8:40 PM  Lumen #1 Status Flushed;Saline locked 04/29/2014  8:40 PM  Lumen #2 Status Flushed;Saline locked 04/29/2014  8:40 PM  Dressing Type Transparent 04/29/2014  8:40 PM  Dressing Status Clean;Dry;Intact;Antimicrobial disc in place 04/29/2014  8:40 PM  Dressing Change Due 05/06/14 04/29/2014  8:40 PM       Netta Corrigan L 04/29/2014, 8:56 PM

## 2014-04-29 NOTE — Consult Note (Addendum)
[address]       [place] [address]             [phone]        Jedi Barboza [facility] Medical Record #[id number] Date of Birth: [birth year]  Referring: [name], MD Primary Care: [name], MD  Chief Complaint:   Aortic Valve Endocarditis  History of Present Illness:    Mr. Suire is a 29 yo white male well known to [facility]. He originally presented in November of 2015. At that time he was transferred from [facility] to [facility] on 12/23/2013 for suspected meningitis. At that time the patient developed chills, rigors, headache, fever, neck stiffness, nausea and vomiting. He presented to urgent care who diagnosed the patient with the flu. However follow up with his PCP caused concern for possible meningitis. He was sent to the ED where lumbar puncture was done. Upon arrival to [facility] he was placed on Vancomycin, Rocephin, and Acyclovir for Group B Streptococcal Meningitis. Further workup with MRI of brain showed scattered micro abscess'. The patient's condition gradually improved with ABX. Echocardiogram was later obtained and showed Aortic Insufficiency and Aortic Vegetation. However, the morning of 12/29/2013 the patient became acutely worse and required intubation. TEE was performed and showed a large Aortic Vegetation with severe Aortic Insufficiency, tricuspid Valve Vegetation with least moderate TR and a fistula from the aorta and right atrium. It was felt surgical intervention would be required. Dr. Tyrone Sage evaluated the patient and performed an Ascending Aortic Root Replacement, Aortic Valve Replacement, closure of PFO and closure of LVOT to RA fistula on 12/30/2013. The patient's hospital course was complicated by complete heart block requiring permanent pacemaker placement on 01/04/2014. He was discharged home with IV ABX on 01/07/2014.  Initially patient did well. He presented for routine office follow up 01/21/2014 at which time he  was still feeling well. However, beginning 02/06/2014, the patient developed shortness of breath with minimal activity and occasionally at rest. The patient also developed some abdominal bloating. He did not notice any fevers. He was evaluated by Dr. Tyrone Sage on 02/11/2014 at which point he felt due to the patient's worsening symptoms he should be admitted for further workup. Upon admission to [facility] repeat blood cultures were obtained and were negative. Echocardiogram showed a re-occurrence in the RVOT And severe TR. It was felt TEE should be obtained and CT surgery consult was again requested. TEE was done and revealed a large VSD with left to right shunting from LVOT. There was also disruption of the previously repaired fistula and Aortic Root Abscess Cavity. It was felt surgical intervention would be required. He was taken to the operating room by Dr. Cornelius Moras on 02/12/2014. He underwent Redo Median Sternotomy, Repair of a VSD, Tricuspid Valve Repair, and Redo closure of PFO.  The patient again did well post operatively and was discharged home with a course of IV antibiotics.  The patient has again done well post operatively.  He has been followed by Dr. Ludwig Clarks office and Dr. Tyrone Sage at which times patient was doing well and recovering as expected.  According to the patient, his significant other was laying on his chest and thought she heard a heart murmur. This was confirmed with a stethoscope. The patient then contacted Dr. Dennie Maizes office on 04/28/2014 stating he thinks he may have a new heart murmur.  Dr. Tyrone Sage recommended the patient have an Echocardiogram done.  This was scheduled and performed on 04/29/2014.  This was reviewed and the patient was found to have a 1.7 x 1.3 cm Vegetation on the Aortic Valve Homograft with Aortic Insufficiency.  An aortic abscess could not be excluded.  It was felt the patient should be admitted for IV ABX, blood cultures and further workup.  [facility] has  been consulted.  Currently, the patient is no acute distress. He has had no chest pain, shortness of breath, fever, chills, or lower extremity edema.  Current Activity/ Functional Status: Patient is independent with mobility/ambulation, transfers, ADL's, IADL's.   Zubrod Score: At the time of surgery this patient's most appropriate activity status/level should be described as:     0    Normal activity, no symptoms     1    Restricted in physical strenuous activity but ambulatory, able to do out light work     2    Ambulatory and capable of self care, unable to do work activities, up and about                 more than 50%  Of the time                                3    Only limited self care, in bed greater than 50% of waking hours     4    Completely disabled, no self care, confined to bed or chair     5    Moribund  Past Medical History  Diagnosis Date  . Obesity   . Smokeless tobacco use   . Fracture of left femur   . Meningitis     Group B Strep (November 2015)  . Patent foramen ovale     Closed during procedure on December 30, 2013  . History of open heart surgery     December 30, 2013- ascending aortic root replacement, TEE  . Endocarditis     related to meningitis   . Brain abscess   . Presence of permanent cardiac pacemaker 01/04/2014    PPM MEDTRONIC FOR COMPLETE HEART BLOCK  . Thrombocytopenia 12/2013  . AKI (acute kidney injury)   . 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 Tricuspid regurgitation 02/11/2014  . 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 S/P ventricular septal defect repair + redo tricuspid valve repair + redo closure PFO 02/12/2014    Redo sternotomy for bovine pericardial patch repair of large ventricular septal defect (recurrent LVOT to RA fistula  due to bacterial endocarditis)  . S/P redo tricuspid valve repair 02/12/2014    Complex valvuloplasty including patch closure of VSD with plication of septal leaflet and 26 mm Edwards mc3 ring annuloplasty  . S/P redo patent foramen ovale closure 02/12/2014  . S/P placement of cardiac pacemaker 01/04/2014    Medtronic (serial number [id number]) dual chamber permanent pacemaker implanted by Dr Ladona Ridgel  . moderate residual aortic insufficiency s/p allograft aortic root replacement 03/08/2014    Past Surgical History  Procedure Laterality Date  . Ascending aortic root replacement N/A 12/30/2013    Procedure: ASCENDING AORTIC ROOT REPLACEMENT with 23mm HOMOGRAFT, CLOSURE OF AORTIC TO RIGHT ATRIAL FISTULA, CLOSURE OF PATENT FORAMEN OVALE;  Surgeon: Delight Ovens, MD;  Location: [facility] OR;  Service: Open Heart  Surgery;  Laterality: N/A;  . Tricuspid valve replacement N/A 12/30/2013    Procedure: REPAIR OF TRICUSPID VALVE LEAFLET;  Surgeon: Delight Ovens, MD;  Location[facility] OR;  Service: Open Heart Surgery;  Laterality: N/A;  . Intraoperative transesophageal echocardiogram N/A 12/30/2013    Procedure: INTRAOPERATIVE TRANSESOPHAGEAL ECHOCARDIOGRAM;  Surgeon: Delight Ovens, MD;  Location: [facility] OR;  Service: Open Heart Surgery;  Laterality: N/A;  . Permanent pacemaker insertion N/A 01/04/2014    Procedure: PERMANENT PACEMAKER INSERTION;  Surgeon: Marinus Maw, MD;  Location: [facility] CATH LAB;  Service: Cardiovascular;  Laterality: N/A;  . Insert / replace / remove pacemaker  01/04/2014  . Right heart catheterization N/A 02/12/2014    Procedure: RIGHT HEART CATH;  Surgeon: Laurey Morale, MD;  Location: [facility] CATH LAB;  Service: Cardiovascular;  Laterality: N/A;  . Tee without cardioversion N/A 02/12/2014    Procedure: TRANSESOPHAGEAL ECHOCARDIOGRAM (TEE);  Surgeon: Quintella Reichert, MD;  Location: [facility] ENDOSCOPY;  Service: Cardiovascular;  Laterality: N/A;  . Tricuspid valve replacement N/A 02/12/2014    Procedure:  TRICUSPID VALVE REPAIR, REDO MEDIAN STERNOTOMY, REPAIR OF VENTRICULAR SEPTAL DEFECT (RECURRENT LV OUTFLOW TRACT TO RIGHT ATRIAL FISTULA), REDO CLOSURE OF PATENT FORAMEN OVALE;  Surgeon: Purcell Nails, MD;  Location: [facility] OR;  Service: Open Heart Surgery;  Laterality: N/A;    History  Smoking status  . Never Smoker   Smokeless tobacco  . Former Neurosurgeon  . Quit date: 12/30/2013    History  Alcohol Use  . Yes    History   Social History  . Marital Status: Single    Spouse Name: N/A  . Number of Children: N/A  . Years of Education: N/A   Occupational History  . Not on file.   Social History Main Topics  . Smoking status: Never Smoker   . Smokeless tobacco: Former Neurosurgeon    Quit date: 12/30/2013  . Alcohol Use: Yes  . Drug Use: No  . Sexual Activity: Not on file   Other Topics Concern  . Not on file   Social History Narrative    No Known Allergies  No current facility-administered medications for this encounter.   Current Outpatient Prescriptions  Medication Sig Dispense Refill  . amoxicillin (AMOXIL) 500 MG capsule Take 1 capsule (500 mg total) by mouth 3 (three) times daily. 90 capsule 3  . aspirin EC 325 MG EC tablet Take 1 tablet (325 mg total) by mouth daily. 30 tablet 0  . bismuth subsalicylate (PEPTO BISMOL) 262 MG/15ML suspension Take by mouth every 6 (six) hours as needed for indigestion.    . Multiple Vitamin (MULTIVITAMIN WITH MINERALS) TABS tablet Take 1 tablet by mouth daily.      No prescriptions prior to admission    Family History  Problem Relation Age of Onset  . Heart murmur Brother    Review of Systems:     Cardiac Review of Systems: Y or N  Chest Pain [  N  ]  Resting SOB Klaus.Mock   ] Exertional SOB  Klaus.Mock  ]  Orthopnea [ N ]   Pedal Edema [  N ]    Palpitations [ N ] Syncope  Klaus.Mock  ]   Presyncope [  N ]  General Review of Systems: [Y] = yes [  ]=no Constitional: recent weight change Klaus.Mock ]; anorexia [ N ]; fatigue [  N]; nausea [ N ]; night sweats Klaus.Mock  ];  fever [ N ]; or chills [ N ]  Eye : blurred vision [ N ]; diplopia [ N  ]; vision changes [ N ];  Amaurosis fugax[ N ]; Resp: cough Klaus.Mock  ];  wheezing[ N ];  hemoptysis[ N ]; shortness of breath[ N ]; paroxysmal nocturnal dyspnea [N] GI: vomiting[N  ];  dysphagia[N  ]; melena[N  ];  hematochezia Klaus.Mock  ]; heartburn[ n ] GU: kidney stones [N  ]; hematuria[N  ];   dysuria [ N ];  nocturia[ N ];  history of obstruction [ N ]; urinary frequency [ N ]             Skin: rash, swelling[ N ];, hair loss[N  ];  peripheral edema[ N ];  or itching[ N ]; Musculosketetal: myalgias[ N ];  joint swelling[ N ];  joint erythema[ N ]; joint pain[  Y];  back pain[  N];  Heme/Lymph: bruising[N  ];  bleeding[N  ] Neuro: TIA[ N ];  headaches[ N ];  stroke[ N ];  vertigo[ N ];  seizures[N  ];   paresthesias[N  ];  difficulty walking[ N ];  Psych:depression[N  ]; anxiety[ N ];  Endocrine: diabetes[ N ];  thyroid dysfunction[  N];   Physical Exam: Temp 98.3 (36.8), HR 116, RR 20, BP 123/74, Oxygen saturation 99%  Physical Exam: General appearance: alert, cooperative and no distress Head: Normocephalic, without obvious abnormality, atraumatic Neck: no adenopathy, no carotid bruit, no JVD and supple, symmetrical, trachea midline Resp: clear to auscultation bilaterally Cardio: RRR, systolic Grade II murmur heard best along left sternal border GI: soft, non-tender; bowel sounds normal; no masses,  no organomegaly Extremities: extremities normal, atraumatic, no cyanosis or edema Neurologic: Grossly normal  Diagnostic Studies & Laboratory data:     Recent Radiology Findings:    TTE: Preliminary Technician Findings:  There is a 1.7 x 1.3 cm Vegitation on the Aortic Valve Homograft, with Aortic insufficiency. An Aortic Abcess cannot be ruled out by transthoracic echo  Recent Lab Findings: Lab Results  Component Value Date   WBC 9.7 02/16/2014   HGB 8.7*  02/16/2014   HCT 26.6* 02/16/2014   PLT 146* 02/16/2014   GLUCOSE 109* 02/18/2014   TRIG 119 12/29/2013   ALT 86* 02/14/2014   AST 68* 02/14/2014   NA 140 02/18/2014   K 3.7 02/18/2014   CL 104 02/18/2014   CREATININE 0.80 02/18/2014   BUN 5* 02/18/2014   CO2 30 02/18/2014   TSH 2.562 02/11/2014   INR 1.40 02/13/2014    Assessment / Plan:      1. Aortic Valve Vegetation- patient with previous endocarditis S/P Aortic Root Replacement and Closure of PFT (12/2013) and redo sternotomy with TVR, closure of VSD, PFO 01/2014 2. Suspected Endocarditis- will need blood cultures and ID consult. Will need PICC line as well. 3. TEE scheduled for tomorrow- to reassess Aortic Valve 4. CV- PPM in place, has been followed by EP  Timothee Gali Margaretann Loveless PA-C  04/29/2014 3:02 PM  Restart IV antibiotics, TEE tomorrow. Patient feels well no chf symptoms.  I have seen and examined [name] and agree with the above assessment  and plan.  Delight Ovens MD Beeper [phone] Office [phone] 04/29/2014 6:03 PM

## 2014-04-29 NOTE — Progress Notes (Signed)
ANTIBIOTIC CONSULT NOTE - INITIAL  Pharmacy Consult for vancomycin, gentamicin, and cefepime Indication: recurrent aortic valve endocarditis  No Known Allergies  Patient Measurements: Height: 5\' 5"  (165.1 cm) Weight: 181 lb 12.8 oz (82.464 kg) IBW/kg (Calculated) : 61.5  ABW: 70 kg  Vital Signs: Temp: 98.3 F (36.8 C) (03/17 1500) Temp Source: Oral (03/17 1500) BP: 123/74 mmHg (03/17 1500) Pulse Rate: 116 (03/17 1500) Intake/Output from previous day:   Intake/Output from this shift: Total I/O In: 240 [P.O.:240] Out: -   Labs: No results for input(s): WBC, HGB, PLT, LABCREA, CREATININE in the last 72 hours. CrCl cannot be calculated (Patient has no serum creatinine result on file.). No results for input(s): VANCOTROUGH, VANCOPEAK, VANCORANDOM, GENTTROUGH, GENTPEAK, GENTRANDOM, TOBRATROUGH, TOBRAPEAK, TOBRARND, AMIKACINPEAK, AMIKACINTROU, AMIKACIN in the last 72 hours.   Microbiology: No results found for this or any previous visit (from the past 720 hour(s)).  Medical History: Past Medical History  Diagnosis Date  . Obesity   . Smokeless tobacco use   . Fracture of left femur   . Meningitis     Group B Strep (November 2015)  . Patent foramen ovale     Closed during procedure on December 30, 2013  . History of open heart surgery     December 30, 2013- ascending aortic root replacement, TEE  . Endocarditis     related to meningitis   . Brain abscess   . Presence of permanent cardiac pacemaker 01/04/2014    PPM MEDTRONIC FOR COMPLETE HEART BLOCK  . Thrombocytopenia 12/2013  . AKI (acute kidney injury)   . 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 Tricuspid regurgitation 02/11/2014  . 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 S/P ventricular septal defect repair + redo  tricuspid valve repair + redo closure PFO 02/12/2014    Redo sternotomy for bovine pericardial patch repair of large ventricular septal defect (recurrent LVOT to RA fistula due to bacterial endocarditis)  . S/P redo tricuspid valve repair 02/12/2014    Complex valvuloplasty including patch closure of VSD with plication of septal leaflet and 26 mm Edwards mc3 ring annuloplasty  . S/P redo patent foramen ovale closure 02/12/2014  . S/P placement of cardiac pacemaker 01/04/2014    Medtronic (serial number [id number]) dual chamber permanent pacemaker implanted by Dr Ladona Ridgel  . moderate residual aortic insufficiency s/p allograft aortic root replacement 03/08/2014    Medications:  See PTA medication list  Assessment: 29 y/o male with group B Strep aortic valve endocarditis and meningitis in 12/2013 s/p aortic root replacement, bioprosthetic AVR, PFO closure, and tricuspid valve repair. Then in 01/2014 he underwent redo median sternotomy, repair of VSD, tricuspid valve repair, and redo closure of PFO. He is readmitted today after echo showed new aortic valve vegetation and is to begin vancomycin, gentamicin, and cefepime for recurrent aortic valve endocarditis. Since treatment of his initial endocarditis with ceftriaxone, he has been maintained on amoxicillin. Last SCr was 0.7 on 02/18/2014. Est CrCl > 100 ml/min. New cultures are pending.  He had previous gentamicin trough at goal on 80 mg q12h.  Goal of Therapy:  Gentamicin peak 3-4 mcg/ml, trough of <1 mcg/ml Vancomycin trough level 15-20 mcg/ml Eradication of infection  Plan:  - Vancomycin 1000 mg IV q8h - Cefepime 2 g IV q12h - Gentamicin 80 mg  IV q12h - Check vancomycin and gentamicin trough levels at steady-state - Monitor renal function, clinical course, and [employer], [address].D., BCPS Clinical Pharmacist Pager: [phone] 04/29/2014 6:39 PM

## 2014-04-29 NOTE — H&P (Signed)
Patient ID: [name] MRN: [id number], DOB/AGE: 1986-01-27   Admit date: 04/29/2014   Primary Physician: Orpah Melter, MD Primary Cardiologist: Dr Pennie Banter  HPI: 29 y/o male initially seen in Nov 2015 with "flu" that ended up being bacterial endocarditis. He underwent aortic root and valve replacement with a fistula repair in Nov. At that time he also had CHB and had a MDT PTVDP placed. In Jan he had symptoms of dyspnea and was re evaluated. A TEE was done and and revealed a large VSD with left to right shunting from LVOT. On 02/12/2014 he underwent Redo Median Sternotomy, Repair of a VSD, Tricuspid Valve Repair, and Redo closure of PFO.         He did well but recently a new murmur was heard. An echo was done today and revealed an aortic vegetation. He is admitted now for further evaluation.   Problem List: Past Medical History  Diagnosis Date  . Obesity   . Smokeless tobacco use   . Fracture of left femur   . Meningitis     Group B Strep (November 2015)  . Patent foramen ovale     Closed during procedure on December 30, 2013  . History of open heart surgery     December 30, 2013- ascending aortic root replacement, TEE  . Endocarditis     related to meningitis   . Brain abscess   . Presence of permanent cardiac pacemaker 01/04/2014    PPM MEDTRONIC FOR COMPLETE HEART BLOCK  . Thrombocytopenia 12/2013  . AKI (acute kidney injury)   . 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 Tricuspid regurgitation 02/11/2014  . 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 S/P ventricular septal defect repair + redo tricuspid valve repair + redo closure PFO 02/12/2014    Redo sternotomy for bovine pericardial patch repair of large ventricular septal defect (recurrent LVOT to RA fistula due to  bacterial endocarditis)  . S/P redo tricuspid valve repair 02/12/2014    Complex valvuloplasty including patch closure of VSD with plication of septal leaflet and 26 mm Edwards mc3 ring annuloplasty  . S/P redo patent foramen ovale closure 02/12/2014  . S/P placement of cardiac pacemaker 01/04/2014    Medtronic (serial number [id number]) dual chamber permanent pacemaker implanted by Dr Lovena Le  . moderate residual aortic insufficiency s/p allograft aortic root replacement 03/08/2014    Past Surgical History  Procedure Laterality Date  . Ascending aortic root replacement N/A 12/30/2013    Procedure: ASCENDING AORTIC ROOT REPLACEMENT with 57m HOMOGRAFT, CLOSURE OF AORTIC TO RIGHT ATRIAL FISTULA, CLOSURE OF PATENT FORAMEN OVALE;  Surgeon: EGrace Isaac MD;  Location: [facility]  Service: Open Heart Surgery;  Laterality: N/A;  . Tricuspid valve replacement N/A 12/30/2013    Procedure: REPAIR OF TRICUSPID VALVE LEAFLET;  Surgeon: EGrace Isaac MD;  Location: [facility]  Service: Open Heart Surgery;  Laterality: N/A;  . Intraoperative transesophageal echocardiogram N/A 12/30/2013    Procedure: INTRAOPERATIVE TRANSESOPHAGEAL ECHOCARDIOGRAM;  Surgeon: EGrace Isaac MD;  [facility]  Service: Open Heart Surgery;  Laterality: N/A;  . Permanent pacemaker insertion N/A 01/04/2014    Procedure: PERMANENT PACEMAKER INSERTION;  Surgeon: GEvans Lance MD;  [facility];  Service: Cardiovascular;  Laterality: N/A;  . Insert / replace /  remove pacemaker  01/04/2014  . Right heart catheterization N/A 02/12/2014    Procedure: RIGHT HEART CATH;  Surgeon: Larey Dresser, MD;  Location[facility] C[facility] LAB;  Service: Cardiovascular;  Laterality: N/A;  . Tee without cardioversion N/A 02/12/2014    Procedure: TRANSESOPHAGEAL ECHOCARDIOGRAM (TEE);  Surgeon: Sueanne Margarita, MD;  Location: [facility] ENDOSCOPY;  Service: Cardiovascular;  Laterality: N/A;  . Tricuspid valve replacement N/A 02/12/2014    Procedure: TRICUSPID  VALVE REPAIR, REDO MEDIAN STERNOTOMY, REPAIR OF VENTRICULAR SEPTAL DEFECT (RECURRENT LV OUTFLOW TRACT TO RIGHT ATRIAL FISTULA), REDO CLOSURE OF PATENT FORAMEN OVALE;  Surgeon: Rexene Alberts, MD;  Location: [place];  Service: Open Heart Surgery;  Laterality: N/A;     Allergies: No Known Allergies   Home Medications Current Facility-Administered Medications  Medication Dose Route Frequency Provider Last Rate Last Dose  . acetaminophen (TYLENOL) tablet 650 mg  650 mg Oral Q4H PRN Erlene Quan, PA-C      . ALPRAZolam Duanne Moron) tablet 0.25 mg  0.25 mg Oral BID PRN Erlene Quan, PA-C      . [START ON 04/30/2014] aspirin EC tablet 325 mg  325 mg Oral Daily Luke K Kilroy, PA-C      . bismuth subsalicylate (PEPTO BISMOL) 262 MG/15ML suspension 30 mL  30 mL Oral Q6H PRN Erlene Quan, PA-C      . heparin injection 5,000 Units  5,000 Units Subcutaneous 3 times per day Erlene Quan, PA-C      . [START ON 04/30/2014] multivitamin with minerals tablet 1 tablet  1 tablet Oral Daily Luke K Kilroy, PA-C      . ondansetron [facility]) injection 4 mg  4 mg Intravenous Q6H PRN Luke K Kilroy, PA-C      . zolpidem (AMBIEN) tablet 5 mg  5 mg Oral QHS PRN,MR X 1 Erlene Quan, PA-C         Family History  Problem Relation Age of Onset  . Heart murmur Brother      History   Social History  . Marital Status: Single    Spouse Name: N/A  . Number of Children: N/A  . Years of Education: N/A   Occupational History  . Not on file.   Social History Main Topics  . Smoking status: Never Smoker   . Smokeless tobacco: Former Systems developer    Quit date: 12/30/2013  . Alcohol Use: Yes  . Drug Use: No  . Sexual Activity: Not on file   Other Topics Concern  . Not on file   Social History Narrative     Review of Systems: General: negative for chills, fever, night sweats or weight changes.  Cardiovascular: negative for chest pain, dyspnea on exertion, edema, orthopnea, palpitations, paroxysmal nocturnal dyspnea or  shortness of breath Dermatological: negative for rash Respiratory: negative for cough or wheezing Urologic: negative for hematuria Abdominal: negative for nausea, vomiting, diarrhea, bright red blood per rectum, melena, or hematemesis Neurologic: negative for visual changes, syncope, or dizziness All other systems reviewed and are otherwise negative except as noted above.  Physical Exam: Blood pressure 123/74, pulse 116, temperature 98.3 F (36.8 C), temperature source Oral, resp. rate 20, height _0  (1.651 m), weight 181 lb 12.8 oz (82.464 kg), SpO2 99 %.  General appearance: alert, cooperative and no distress Neck: no carotid bruit and no JVD Lungs: clear to auscultation bilaterally Heart: regular rate and rhythm and 2/6 systolic murmur at AOV Abdomen: soft, non-tender; bowel sounds normal; no masses,  no organomegaly Extremities: extremities normal, atraumatic, no cyanosis or edema and no stigmata of endocarditis Pulses: 2+ and symmetric Skin: Skin color, texture, turgor normal. No rashes or lesions Neurologic: Grossly normal    Labs:  No results found for this or any previous visit (from the past 24 hour(s)).   Radiology/Studies: Dg Chest 2 View  04/01/2014   CLINICAL DATA:  29 year old male with personal history of bacterial endocarditis and prior surgery. Subsequent encounter.  EXAM: CHEST  2 VIEW  COMPARISON:  03/15/2014 and earlier.  FINDINGS: Stable left chest cardiac pacemaker. Stable cardiac size and mediastinal contours. Stable lung volumes. Mild streaky perihilar opacity has not significantly changed. No pneumothorax, pulmonary edema, pleural effusion or acute pulmonary opacity. Stable visualized osseous structures. Visualized tracheal air column is within normal limits.  IMPRESSION: Stable mild nonspecific perihilar opacity. No new cardiopulmonary abnormality.   Electronically Signed   By: Genevie Ann M.D.   On: 04/01/2014 11:12    ASSESSMENT AND PLAN:  Principal Problem:    Aortic valve vegetation on echo 04/29/14 Active Problems:   Endocarditis of aortic valve-November 2015   S/P aortic root and valve allograft Nov 2015with re do surgery Jan 1st 2016   Pacemaker- MDT Nov 2015   PLAN:  ID has been contacted, CVTS has seen the pt, blood cultures ordered (EPIC would only let me order 2), TEE tomorrow at 9 am. Will place PICC line and order PA and Lat CXR.   Henri Medal, PA-C 04/29/2014, 3:56 PM   As above, patient seen and examined.Briefly the patient is a 29 year old male with history of aortic root/valve replacement (homograft) in November due to group B strep. Course complicated by complete heart block requiring pacemaker. Patient had redo surgery to repair a VSD, redo closure of a foramen ovale and tricuspid valve repair in January. Patient has done well and denies dyspnea, chest pain, palpitations or syncope. He completed IV antibiotics one month ago and has been on oral amoxicillin since that time. His fiance listened to his heart yesterday with her stethoscope and felt he had increased murmur. He was scheduled for an echocardiogram today which showed aortic valve vegetation. We will admit and check 3 sets of blood cultures. These may be negative because of his oral antibiotics. I have asked infectious disease to evaluate with recommendations for IV antibiotics. These will be initiated after his cultures are drawn. We will proceed with transesophageal echocardiogram tomorrow. Dr. Servando Snare has been advised the patient is in-house and he will also evaluate. Patient may need redo surgery. Check ESR, ECG and baseline labs. Kirk Ruths

## 2014-04-29 NOTE — Telephone Encounter (Signed)
Patient had a repeat echocardiogram today in the office. Preliminary images show vegetation on the aortic valve. I have discussed the patient with Dr. Tyrone Sage. I am concerned about recurrent prosthetic valve endocarditis. I have contacted the patient and discussed the results with him. We will plan to bring him to the hospital today to telemetry. We will obtain 3 sets of blood cultures. I will have infectious disease see the patient and recommend antibiotic coverage which will be initiated once blood cultures are obtained. He will need a PICC line for antibiotics. We will plan transesophageal echocardiogram tomorrow to further assess. He may require redo valve surgery. We will make further decisions based on TEE and follow-up cultures. [name]

## 2014-04-30 ENCOUNTER — Ambulatory Visit (HOSPITAL_COMMUNITY): Admission: RE | Admit: 2014-04-30 | Payer: BC Managed Care – PPO | Source: Ambulatory Visit | Admitting: Cardiology

## 2014-04-30 ENCOUNTER — Encounter (HOSPITAL_COMMUNITY)
Admission: AD | Disposition: A | Discharge status: Home Health | Payer: Self-pay | Source: Ambulatory Visit | Attending: Cardiology

## 2014-04-30 ENCOUNTER — Inpatient Hospital Stay (HOSPITAL_COMMUNITY): Payer: BC Managed Care – PPO

## 2014-04-30 ENCOUNTER — Encounter (HOSPITAL_COMMUNITY): Payer: Self-pay

## 2014-04-30 DIAGNOSIS — I340 Nonrheumatic mitral (valve) insufficiency: Secondary | ICD-10-CM

## 2014-04-30 HISTORY — PX: TEE WITHOUT CARDIOVERSION: SHX5443

## 2014-04-30 LAB — SEDIMENTATION RATE: Sed Rate: 27 mm/hr — ABNORMAL HIGH (ref 0–16)

## 2014-04-30 SURGERY — ECHOCARDIOGRAM, TRANSESOPHAGEAL
Anesthesia: Moderate Sedation

## 2014-04-30 MED ORDER — FENTANYL CITRATE 0.05 MG/ML IJ SOLN
INTRAMUSCULAR | Status: DC | PRN
  Administered 2014-04-30 (×2): 25 ug via INTRAVENOUS

## 2014-04-30 MED ORDER — BUTAMBEN-TETRACAINE-BENZOCAINE 2-2-14 % EX AERO
INHALATION_SPRAY | CUTANEOUS | Status: DC | PRN
  Administered 2014-04-30: 2 via TOPICAL

## 2014-04-30 MED ORDER — MIDAZOLAM HCL 10 MG/2ML IJ SOLN
INTRAMUSCULAR | Status: DC | PRN
  Administered 2014-04-30: 1 mg via INTRAVENOUS
  Administered 2014-04-30 (×2): 2 mg via INTRAVENOUS

## 2014-04-30 MED ORDER — GENTAMICIN IN SALINE 1.6-0.9 MG/ML-% IV SOLN
80.0000 mg | Freq: Two times a day (BID) | INTRAVENOUS | Status: DC
  Administered 2014-04-30 – 2014-05-02 (×6): 80 mg via INTRAVENOUS
  Filled 2014-04-30 (×7): qty 50

## 2014-04-30 MED ORDER — DEXTROSE 5 % IV SOLN
2.0000 g | Freq: Two times a day (BID) | INTRAVENOUS | Status: DC
  Administered 2014-04-30 – 2014-05-03 (×7): 2 g via INTRAVENOUS
  Filled 2014-04-30 (×8): qty 2

## 2014-04-30 NOTE — Progress Notes (Signed)
Patient Name: [name] Date of Encounter: 04/30/2014     Principal Problem:   Aortic valve vegetation on echo 04/29/14 Active Problems:   Endocarditis of aortic valve-November 2015   Pacemaker- MDT Nov 2015   S/P aortic root and valve allograft Nov 2015with re do surgery Jan 1st 2016    SUBJECTIVE  The patient had his TEE earlier today by Dr. Anne Fu.  Results are as follows:There is a large (1.5 x 1.1cm) mobile irregular bordered echodensity along the anterior aspect of the subvalvular apparatus of the bioprosthetic aortic valve in the left ventricular outflow tract consistent with vegetation. Although the valve leaflets do not seem to be compromised, there is reverberation artifact from bioprosthetic ring, and vegetation may be attached to base of the leaflet. There is perivalvular abscess like pocket/space in the posterior region of aortic valve adjacent to intraatrial septum.  The patient feels well.  He has not been having any systemic symptoms of fever or night sweats.  Appetite is good. CURRENT MEDS . aspirin EC  325 mg Oral Daily  . ceFEPime (MAXIPIME) IV  2 g Intravenous Q12H  . gentamicin  80 mg Intravenous Q12H  . heparin  5,000 Units Subcutaneous 3 times per day  . multivitamin with minerals  1 tablet Oral Daily  . sodium chloride  10-40 mL Intracatheter Q12H  . vancomycin  1,000 mg Intravenous Q8H    OBJECTIVE  Filed Vitals:   04/30/14 1050 04/30/14 1055 04/30/14 1100 04/30/14 1120  BP: 115/69 127/81 109/69 105/67  Pulse: 130 125 122 103  Temp:      TempSrc:      Resp: Height:      Weight:      SpO2: 97% 98% 97% 98%    Intake/Output Summary (Last 24 hours) at 04/30/14 1157 Last data filed at 04/29/14 2223  Gross per 24 hour  Intake    540 ml  Output      0 ml  Net    540 ml   Filed Weights   04/29/14 1500 04/29/14 1700 04/30/14 0500  Weight: 181 lb 12.8 oz (82.464 kg) 181 lb 12.8 oz (82.464 kg) 181 lb (82.101 kg)    PHYSICAL  EXAM  General: Pleasant, NAD. Neuro: Alert and oriented X 3. Moves all extremities spontaneously. Psych: Normal affect. HEENT:  Normal  Neck: Supple without bruits or JVD. Lungs:  Resp regular and unlabored, CTA. Heart: Sinus tachycardia.  Grade 2/6 systolic murmur at aortic area.  I don't hear any AI murmur. Abdomen: Soft, non-tender, non-distended, BS + x 4.  Extremities: No clubbing, cyanosis or edema. DP/PT/Radials 2+ and equal bilaterally.  Accessory Clinical Findings  CBC  Recent Labs  04/29/14 1538  WBC 8.2  NEUTROABS 5.5  HGB 12.8*  HCT 39.1  MCV 78.0  PLT 274   Basic Metabolic Panel  Recent Labs  04/29/14 1538  NA 139  K 4.0  CL 105  CO2 25  GLUCOSE 92  BUN 20  CREATININE 1.00  CALCIUM 9.6   Liver Function Tests  Recent Labs  04/29/14 1538  AST 18  ALT 21  ALKPHOS 62  BILITOT 0.6  PROT 7.2  ALBUMIN 4.1   No results for input(s): LIPASE, AMYLASE in the last 72 hours. Cardiac Enzymes No results for input(s): CKTOTAL, CKMB, CKMBINDEX, TROPONINI in the last 72 hours. BNP Invalid input(s): POCBNP D-Dimer No results for input(s): DDIMER in the last 72 hours. Hemoglobin A1C No results for input(s):  HGBA1C in the last 72 hours. Fasting Lipid Panel No results for input(s): CHOL, HDL, LDLCALC, TRIG, CHOLHDL, LDLDIRECT in the last 72 hours. Thyroid Function Tests No results for input(s): TSH, T4TOTAL, T3FREE, THYROIDAB in the last 72 hours.  Invalid input(s): FREET3  TELE  Sinus tachycardia with ventricular pacing  ECG    Radiology/Studies  Dg Chest 2 View  04/30/2014   CLINICAL DATA:  Endocarditis with history of previous tricuspid valve and aortic root replacement  EXAM: CHEST  2 VIEW  COMPARISON:  Portable chest x-ray of April 29, 2014  FINDINGS: There has been interval withdrawal of the right-sided PICC line since the tip projects over the midportion of the SVC. The lungs are adequately inflated. There is no focal infiltrate. The heart  and pulmonary vascularity are normal. There are 8 intact sternal wires. The permanent pacemaker is in appropriate position.  IMPRESSION: Interval repositioning of the PICC line. There is no acute cardiopulmonary abnormality.   Electronically Signed   By: David  [place]   On: 04/30/2014 07:46   Dg Chest 2 View  04/01/2014   CLINICAL DATA:  29 year old male with personal history of bacterial endocarditis and prior surgery. Subsequent encounter.  EXAM: CHEST  2 VIEW  COMPARISON:  03/15/2014 and earlier.  FINDINGS: Stable left chest cardiac pacemaker. Stable cardiac size and mediastinal contours. Stable lung volumes. Mild streaky perihilar opacity has not significantly changed. No pneumothorax, pulmonary edema, pleural effusion or acute pulmonary opacity. Stable visualized osseous structures. Visualized tracheal air column is within normal limits.  IMPRESSION: Stable mild nonspecific perihilar opacity. No new cardiopulmonary abnormality.   Electronically Signed   By: Odessa Fleming M.D.   On: 04/01/2014 11:12   Dg Chest Port 1 View  04/29/2014   CLINICAL DATA:  PICC line placement  EXAM: PORTABLE CHEST - 1 VIEW  COMPARISON:  04/01/2014  FINDINGS: The right upper extremity PICC line extends into the right atrium. There are intact appearances of the transvenous leads. There is prior sternotomy. Heart size is unchanged. Lungs are clear. No large effusions.  IMPRESSION: The new right upper extremity PICC line extends into the right atrium   Electronically Signed   By: Ellery Plunk M.D.   On: 04/29/2014 21:04    ASSESSMENT AND PLAN  Possible recurrent endocarditis of the homograft aortic valve replacement. Functioning Medtronic pacemaker for complete heart block  Plan: Infectious disease has been contacted.  Dr. Tyrone Sage has seen the patient.  He has been started on cefepime gentamicin and vancomycin.  Signed, Cassell Clement MD

## 2014-04-30 NOTE — Progress Notes (Signed)
  Echocardiogram Echocardiogram Transesophageal has been performed.  [name] 04/30/2014, 10:57 AM

## 2014-04-30 NOTE — CV Procedure (Addendum)
    TEE  Indications: bioprosthetic AV - vegetation?  Findings:    - There is a large (1.5 x 1.1cm) mobile irregular bordered echodensity along the anterior aspect of the subvalvular apparatus of the bioprosthetic aortic valve in the left ventricular outflow tract consistent with vegetation. Although the valve leaflets do not seem to be compromised, there is reverberation artifact from bioprosthetic ring, and vegetation may be attached to base of the leaflet. There is perivalvular abscess like pocket/space in the posterior region of aortic valve adjacent to intraatrial septum.    - Pacer leads are normal  - EF normal  - Mild AI  - Mild MR  - Mild TR  - Hypermobile intraatrial septum  Discussed with he and family. Let Ryan know with TCTS.   Donato Schultz, MD

## 2014-04-30 NOTE — Progress Notes (Signed)
Patient ID: [name], male   DOB: [birth year], 29 y.o.   MRN: [id number]         [facility]    Date of Admission:  04/29/2014   Continuous antibiotic therapy since November 2015        Day 1 vancomycin        Day 1 gentamicin        Day 1 cefepime  Principal Problem:   Aortic valve vegetation on echo 04/29/14 Active Problems:   Endocarditis of aortic valve-November 2015   Pacemaker- MDT Nov 2015   S/P aortic root and valve allograft Nov 2015with re do surgery Jan 1st 2016   . aspirin EC  325 mg Oral [name]  . ceFEPime (MAXIPIME) IV  2 g Intravenous Q12H  . gentamicin  80 mg Intravenous Q12H  . heparin  5,000 Units Subcutaneous 3 times per day  . multivitamin with minerals  1 tablet Oral [name]  . sodium chloride  10-40 mL Intracatheter Q12H  . vancomycin  1,000 mg Intravenous Q8H    Subjective: He is dejected about the echocardiographic findings but otherwise is feeling well.  Review of Systems: Pertinent items are noted in HPI.  Past Medical History  Diagnosis Date  . Obesity   . Smokeless tobacco use   . Fracture of left femur   . Meningitis     Group B Strep (November 2015)  . Patent foramen ovale     Closed during procedure on December 30, 2013  . History of open heart surgery     December 30, 2013- ascending aortic root replacement, TEE  . Endocarditis     related to meningitis   . Brain abscess   . Presence of permanent cardiac pacemaker 01/04/2014    PPM MEDTRONIC FOR COMPLETE HEART BLOCK  . Thrombocytopenia 12/2013  . AKI (acute kidney injury)   . 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 Tricuspid regurgitation 02/11/2014  . 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 S/P ventricular septal defect repair + redo tricuspid valve repair +  redo closure PFO 02/12/2014    Redo sternotomy for bovine pericardial patch repair of large ventricular septal defect (recurrent LVOT to RA fistula due to bacterial endocarditis)  . S/P redo tricuspid valve repair 02/12/2014    Complex valvuloplasty including patch closure of VSD with plication of septal leaflet and 26 mm Edwards mc3 ring annuloplasty  . S/P redo patent foramen ovale closure 02/12/2014  . S/P placement of cardiac pacemaker 01/04/2014    Medtronic (serial number [id number]) dual chamber permanent pacemaker implanted by Dr Ladona Ridgel  . moderate residual aortic insufficiency s/p allograft aortic root replacement 03/08/2014    History  Substance Use Topics  . Smoking status: Never Smoker   . Smokeless tobacco: Former Neurosurgeon    Quit date: 12/30/2013  . Alcohol Use: Yes    Family History  Problem Relation Age of Onset  . Heart murmur Brother    No Known Allergies  OBJECTIVE: Blood pressure 105/67, pulse 103, temperature 98.3 F (36.8 C), temperature source Oral, resp. rate 21, height  (1.651 m), weight 181 lb (82.101 kg), SpO2 98 %. General: a little sleepy but otherwise in no distress Skin: no splinter or conjunctival hemorrhages Lungs: clear Cor: 2/6 systolic murmur  Lab Results  Lab Results  Component Value Date   WBC 8.2 04/29/2014   HGB 12.8* 04/29/2014   HCT 39.1 04/29/2014   MCV 78.0 04/29/2014   PLT 274 04/29/2014    Lab Results  Component Value Date   CREATININE 1.00 04/29/2014   BUN 20 04/29/2014   NA 139 04/29/2014   K 4.0 04/29/2014   CL 105 04/29/2014   CO2 25 04/29/2014    Lab Results  Component Value Date   ALT 21 04/29/2014   AST 18 04/29/2014   ALKPHOS 62 04/29/2014   BILITOT 0.6 04/29/2014     Microbiology: Blood cultures negative at less than 24 hours  Transesophageal echocardiogram 04/30/2014  - There is a large (1.5 x 1.1cm) mobile irregular bordered echodensity along the anterior aspect of the subvalvular apparatus of the  bioprosthetic aortic valve in the left ventricular outflow tract consistent with vegetation. Although the valve leaflets do not seem to be compromised, there is reverberation artifact from bioprosthetic ring, and vegetation may be attached to base of the leaflet. There is perivalvular abscess like pocket/space in the posterior region of aortic valve adjacent to intraatrial septum.   - Pacer leads are normal - EF normal - Mild AI - Mild MR - Mild TR - Hypermobile intraatrial septum  Assessment: Despite her multiple cardiac surgeries and 4 months of antibiotic therapy he still has endocarditis. This may be persistence of his original group B streptococcal infection but I will continue current broader therapy for prosthetic valve endocarditis pending blood culture results.  Plan: 1. Continue current antibiotics pending blood culture results  Cliffton Asters, MD [facility] Infectious Disease [facility] [phone] pager   [phone] cell 04/30/2014, 1:44 PM

## 2014-04-30 NOTE — Progress Notes (Signed)
UR Completed Jvion Turgeon Graves-Bigelow, RN,BSN [phone]  

## 2014-04-30 NOTE — Interval H&P Note (Signed)
History and Physical Interval Note:  04/30/2014 7:11 AM  [name]  has presented today for surgery, with the diagnosis of endocarditis    The various methods of treatment have been discussed with the patient and family. After consideration of risks, benefits and other options for treatment, the patient has consented to  Procedure(s): TRANSESOPHAGEAL ECHOCARDIOGRAM (TEE) (N/A) as a surgical intervention .  The patient's history has been reviewed, patient examined, no change in status, stable for surgery.  I have reviewed the patient's chart and labs.  Questions were answered to the patient's satisfaction.     Jamaia Brum

## 2014-04-30 NOTE — Progress Notes (Signed)
Patient ID: [name], male   DOB: [birth year], 29 y.o.   MRN: [id number]      [address]       [name] [address]             [phone]                 Day of Surgery Procedure(s) (LRB): TRANSESOPHAGEAL ECHOCARDIOGRAM (TEE) (N/A)  LOS: 1 day   Subjective: Feels well , no fevers since admission  Objective: Vital signs in last 24 hours: Patient Vitals for the past 24 hrs:  BP Temp Temp src Pulse Resp SpO2 Height Weight  04/30/14 1120 105/67 mmHg - - (!) 103 (!) 21 98 % - -  04/30/14 1100 109/69 mmHg - - (!) 122 (!) 26 97 % - -  04/30/14 1055 127/81 mmHg - - (!) 125 (!) 21 98 % - -  04/30/14 1050 115/69 mmHg - - (!) 130 20 97 % - -  04/30/14 1045 102/78 mmHg - - (!) 118 20 95 % - -  04/30/14 1040 - - - (!) 118 (!) 23 95 % - -  04/30/14 1039 110/75 mmHg - - (!) 119 19 94 % - -  04/30/14 1030 109/65 mmHg - - (!) 121 (!) 22 99 % - -  04/30/14 1025 112/77 mmHg - - (!) 120 20 98 % - -  04/30/14 1020 124/68 mmHg - - (!) 123 18 98 % - -  04/30/14 1015 112/73 mmHg - - (!) 123 19 98 % - -  04/30/14 1010 (!) 143/81 mmHg - - 65 (!) 30 98 % - -  04/30/14 1005 116/73 mmHg - - (!) 106 14 97 % - -  04/30/14 1000 117/89 mmHg - - (!) 111 (!) 21 100 % - -  04/30/14 0955 109/69 mmHg - - 98 (!) 23 100 % - -  04/30/14 0950 109/67 mmHg - - 97 12 100 % - -  04/30/14 0812 111/83 mmHg 98.3 F (36.8 C) Oral (!) 118 19 98 % - -  04/30/14 0500 105/66 mmHg 98 F (36.7 C) Oral 92 - 98 % 5\' 5"  (1.651 m) 181 lb (82.101 kg)  04/29/14 2005 110/68 mmHg 97.9 F (36.6 C) Oral 90 18 96 % - -    Filed Weights   04/29/14 1500 04/29/14 1700 04/30/14 0500  Weight: 181 lb 12.8 oz (82.464 kg) 181 lb 12.8 oz (82.464 kg) 181 lb (82.101 kg)    Hemodynamic parameters for last 24 hours:    Intake/Output from previous day: 03/17 0701 - 03/18 0700 In: 540 [P.O.:240; IV Piggyback:300] Out: -  Intake/Output this shift:    Scheduled Meds: . aspirin EC  325 mg Oral Daily  . ceFEPime (MAXIPIME)  IV  2 g Intravenous Q12H  . gentamicin  80 mg Intravenous Q12H  . heparin  5,000 Units Subcutaneous 3 times per day  . multivitamin with minerals  1 tablet Oral Daily  . sodium chloride  10-40 mL Intracatheter Q12H  . vancomycin  1,000 mg Intravenous Q8H   Continuous Infusions:  PRN Meds:.acetaminophen, ALPRAZolam, bismuth subsalicylate, ondansetron (ZOFRAN) IV, sodium chloride, zolpidem  General appearance: alert, cooperative and no distress Neurologic: intact Heart: systolic murmur: early systolic 2/6, crescendo at lower left sternal border Lungs: clear to auscultation bilaterally Abdomen: soft, non-tender; bowel sounds normal; no masses,  no organomegaly Extremities: extremities normal, atraumatic, no cyanosis or edema and no evidence of emboli Wound: sternum stable  Lab Results:  CBC: Recent Labs  04/29/14 1538  WBC 8.2  HGB 12.8*  HCT 39.1  PLT 274   BMET:  Recent Labs  04/29/14 1538  NA 139  K 4.0  CL 105  CO2 25  GLUCOSE 92  BUN 20  CREATININE 1.00  CALCIUM 9.6    PT/INR: No results for input(s): LABPROT, INR in the last 72 hours.   Radiology Dg Chest 2 View  04/30/2014   CLINICAL DATA:  Endocarditis with history of previous tricuspid valve and aortic root replacement  EXAM: CHEST  2 VIEW  COMPARISON:  Portable chest x-ray of April 29, 2014  FINDINGS: There has been interval withdrawal of the right-sided PICC line since the tip projects over the midportion of the SVC. The lungs are adequately inflated. There is no focal infiltrate. The heart and pulmonary vascularity are normal. There are 8 intact sternal wires. The permanent pacemaker is in appropriate position.  IMPRESSION: Interval repositioning of the PICC line. There is no acute cardiopulmonary abnormality.   Electronically Signed   By: David  [place]   On: 04/30/2014 07:46   Dg Chest Port 1 View  04/29/2014   CLINICAL DATA:  PICC line placement  EXAM: PORTABLE CHEST - 1 VIEW  COMPARISON:  04/01/2014   FINDINGS: The right upper extremity PICC line extends into the right atrium. There are intact appearances of the transvenous leads. There is prior sternotomy. Heart size is unchanged. Lungs are clear. No large effusions.  IMPRESSION: The new right upper extremity PICC line extends into the right atrium   Electronically Signed   By: Ellery Plunk M.D.   On: 04/29/2014 21:04     Assessment/Plan: S/P Procedure(s) (LRB): TRANSESOPHAGEAL ECHOCARDIOGRAM (TEE) (N/A) TEE reviewed and discussed with patient and his family. At this point he has no hemodynamically catastrophic lesions like he did on the last previous presentations. I would not recommend 3rd time redo at this point. The lesion noted in in the outflow tract of the left ventricle and not on the aortic homograph leaflets.  Can not be sure that this is infected vegetations or residual tissue from two previous operations.Considering his previous history I think we assume that infection is still active and treat with antibiotics as we are now doing. Will follow with you.    Delight Ovens MD 04/30/2014 6:05 PM

## 2014-05-01 DIAGNOSIS — I38 Endocarditis, valve unspecified: Secondary | ICD-10-CM

## 2014-05-01 LAB — CBC WITH DIFFERENTIAL/PLATELET
BASOS ABS: 0 10*3/uL (ref 0.0–0.1)
Basophils Relative: 1 % (ref 0–1)
Eosinophils Absolute: 0.4 10*3/uL (ref 0.0–0.7)
Eosinophils Relative: 8 % — ABNORMAL HIGH (ref 0–5)
HEMATOCRIT: 38.5 % — AB (ref 39.0–52.0)
HEMOGLOBIN: 12.9 g/dL — AB (ref 13.0–17.0)
LYMPHS ABS: 0.7 10*3/uL (ref 0.7–4.0)
Lymphocytes Relative: 12 % (ref 12–46)
MCH: 25.9 pg — ABNORMAL LOW (ref 26.0–34.0)
MCHC: 33.5 g/dL (ref 30.0–36.0)
MCV: 77.2 fL — ABNORMAL LOW (ref 78.0–100.0)
Monocytes Absolute: 0.7 10*3/uL (ref 0.1–1.0)
Monocytes Relative: 13 % — ABNORMAL HIGH (ref 3–12)
Neutro Abs: 3.6 10*3/uL (ref 1.7–7.7)
Neutrophils Relative %: 66 % (ref 43–77)
Platelets: 247 10*3/uL (ref 150–400)
RBC: 4.99 MIL/uL (ref 4.22–5.81)
RDW: 15.5 % (ref 11.5–15.5)
WBC: 5.4 10*3/uL (ref 4.0–10.5)

## 2014-05-01 LAB — BASIC METABOLIC PANEL
Anion gap: 7 (ref 5–15)
BUN: 15 mg/dL (ref 6–23)
CALCIUM: 9.6 mg/dL (ref 8.4–10.5)
CO2: 26 mmol/L (ref 19–32)
CREATININE: 0.97 mg/dL (ref 0.50–1.35)
Chloride: 104 mmol/L (ref 96–112)
GFR calc Af Amer: >90 mL/min (ref >90)
Glucose, Bld: 89 mg/dL (ref 70–99)
Potassium: 3.9 mmol/L (ref 3.5–5.1)
SODIUM: 137 mmol/L (ref 135–145)

## 2014-05-01 LAB — VANCOMYCIN, TROUGH: Vancomycin Tr: 18.2 ug/mL (ref 10.0–20.0)

## 2014-05-01 LAB — GENTAMICIN LEVEL, TROUGH

## 2014-05-01 NOTE — Progress Notes (Addendum)
ANTIBIOTIC CONSULT NOTE - FOLLOW UP  Pharmacy Consult for vancomycin, gentamicin, and cefepime Indication: group B strep aortiv valve endocarditis  No Known Allergies Patient Measurements: Height: 5\' 5"  (165.1 cm) Weight: 181 lb 12.8 oz (82.464 kg) IBW/kg (Calculated) : 61.5 Vital Signs: Temp: 97.8 F (36.6 C) (03/19 0500) Temp Source: Oral (03/19 0500) BP: 96/53 mmHg (03/19 0435) Pulse Rate: 101 (03/19 0500) Intake/Output from previous day:   Intake/Output from this shift:    Labs:  Recent Labs  04/29/14 1538  WBC 8.2  HGB 12.8*  PLT 274  CREATININE 1.00   Estimated Creatinine Clearance: 110.7 mL/min (by C-G formula based on Cr of 1).  Recent Labs  05/01/14 0630  VANCOTROUGH 18.2     Microbiology: Recent Results (from the past 720 hour(s))  Culture, blood (routine x 2)     Status: None (Preliminary result)   Collection Time: 04/29/14  5:15 PM  Result Value Ref Range Status   Specimen Description BLOOD LEFT ARM  Final   Special Requests BOTTLES DRAWN AEROBIC ONLY 7CC  Final   Culture   Final           BLOOD CULTURE RECEIVED NO GROWTH TO DATE CULTURE WILL BE HELD FOR 5 DAYS BEFORE ISSUING A FINAL NEGATIVE REPORT Performed at [facility]    Report Status PENDING  Incomplete  Culture, blood (routine x 2)     Status: None (Preliminary result)   Collection Time: 04/29/14  5:25 PM  Result Value Ref Range Status   Specimen Description BLOOD RIGHT ARM  Final   Special Requests BOTTLES DRAWN AEROBIC ONLY 10CC  Final   Culture   Final           BLOOD CULTURE RECEIVED NO GROWTH TO DATE CULTURE WILL BE HELD FOR 5 DAYS BEFORE ISSUING A FINAL NEGATIVE REPORT Note: Culture results may be compromised due to an excessive volume of blood received in culture bottles. Performed at [facility]    Report Status PENDING  Incomplete    Anti-infectives    Start     Dose/Rate Route Frequency Ordered Stop   04/30/14 1200  ceFEPIme (MAXIPIME) 2 g in dextrose 5  % 50 mL IVPB     2 g 100 mL/hr over 30 Minutes Intravenous Every 12 hours 04/30/14 1137     04/30/14 1200  gentamicin (GARAMYCIN) IVPB 80 mg     80 mg 100 mL/hr over 30 Minutes Intravenous Every 12 hours 04/30/14 1137     04/29/14 2000  vancomycin (VANCOCIN) IVPB 1000 mg/200 mL premix     1,000 mg 200 mL/hr over 60 Minutes Intravenous Every 8 hours 04/29/14 1847     04/29/14 2000  gentamicin (GARAMYCIN) IVPB 80 mg  Status:  Discontinued     80 mg 100 mL/hr over 30 Minutes Intravenous Every 12 hours 04/29/14 1851 04/30/14 1137   04/29/14 2000  ceFEPIme (MAXIPIME) 2 g in dextrose 5 % 50 mL IVPB  Status:  Discontinued     2 g 100 mL/hr over 30 Minutes Intravenous Every 12 hours 04/29/14 1851 04/30/14 1137      Assessment: 29 year old male with recurrent group B strep aortic valve endocarditis on day #2 of vancomycin, gentamicin, and cefepime.   Vancomycin trough this AM is therapeutic at 18.2 - let RN know that she is ok to hang this dose.   Goal of Therapy:  Gentamicin peak 3-4 mcg/ml, trough of <1 mcg/ml Vancomycin trough level 15-20 mcg/ml Eradication of  infection  Plan:  Continue vancomycin 1g IV every 8 hours.  Continue Cefepime 2g IV every 12 hours.  Continue Gentamicin  IV every 12 hours. Gentamicin trough today prior to 1200 dose - pending. Will follow.   [name], PharmD, BCPS Clinical Pharmacist [phone]  05/01/2014,8:05 AM   Addendum:  Gentamicin trough < 0.5  Plan: Continue gentamicin 80 mg IV Q 12 hrs

## 2014-05-01 NOTE — Progress Notes (Addendum)
Patient ID: [name], male   DOB: [birth year], 29 y.o.   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 aspirin EC  325 mg Oral Daily  . ceFEPime (MAXIPIME) IV  2 g Intravenous Q12H  . gentamicin  80 mg Intravenous Q12H  . heparin  5,000 Units Subcutaneous 3 times per day  . multivitamin with minerals  1 tablet Oral Daily  . sodium chloride  10-40 mL Intracatheter Q12H  . vancomycin  1,000 mg Intravenous Q8H    Subjective: Remains afebrile, was evaluated by CTsurgery who felt no redo surgery needed at this time but would continue with med mgmt with antibiotics for the lesion noted in the outflow tract of the left ventricle and not on the aortic homograph leaflets.   OBJECTIVE: Blood pressure 96/53, pulse 101, temperature 97.8 F (36.6 C), temperature source Oral, resp. rate 18, height  (1.651 m), weight 181 lb 12.8 oz (82.464 kg), SpO2 98 %. General: alert, no distress Skin: no splinter or conjunctival hemorrhages Lungs: clear Cor: 2/6 systolic murmur  Lab Results Lab Results  Component Value Date   WBC 8.2 04/29/2014   HGB 12.8* 04/29/2014   HCT 39.1 04/29/2014   MCV 78.0 04/29/2014   PLT 274 04/29/2014    Lab Results  Component Value Date   CREATININE 1.00 04/29/2014   BUN 20 04/29/2014   NA 139 04/29/2014   K 4.0 04/29/2014   CL 105 04/29/2014   CO2 25 04/29/2014    Lab Results  Component Value Date   ALT 21 04/29/2014   AST 18 04/29/2014   ALKPHOS 62 04/29/2014   BILITOT 0.6 04/29/2014     Microbiology: Blood cultures negative growth to  date  IMaging: ransesophageal echocardiogram 04/30/2014  - There is a large (1.5 x 1.1cm) mobile irregular bordered echodensity along the anterior aspect of the subvalvular apparatus of the bioprosthetic aortic valve in the left ventricular outflow tract consistent with vegetation. Although the valve leaflets do not seem to be compromised, there is reverberation artifact from bioprosthetic ring, and vegetation may be attached to base of the leaflet. There is perivalvular abscess like pocket/space in the posterior region of aortic valve adjacent to intraatrial septum.   - Pacer leads are normal - Hypermobile intraatrial septum  Assessment: 28yo M with native valve endocarditis 2/2 group b strep s/p aortic root and valve allograft in Nov 2015 s/p redo in Jan 2016 and 4 months of antibiotic therapy who is now found to have  lesion noted in in the outflow tract of the left ventricle and not on the aortic homograph leaflets.unclear if infected vegetations or residual tissue from two previous operations persistence of his original group B streptococcal infection  Plan: 1. We will continue with current antibiotics pending blood culture results 2. Therapeutic drug monitoring, will get cbc and bmp today as well as gent trough  Judyann Munson, MD [facility] [phone] pager   [phone] cell 05/01/2014, 9:26 AM

## 2014-05-01 NOTE — Progress Notes (Signed)
SUBJECTIVE:  No complaints  OBJECTIVE:   Vitals:   Filed Vitals:   04/30/14 1525 04/30/14 2030 05/01/14 0435 05/01/14 0500  BP: 104/58 97/61 96/53    Pulse: 123 103  101  Temp: 98.7 F (37.1 C) 97.5 F (36.4 C)  97.8 F (36.6 C)  TempSrc: Oral Oral  Oral  Resp: Height:     (1.651 m)  Weight:    181 lb 12.8 oz (82.464 kg)  SpO2: 99% 98%  98%   I&O's:  No intake or output data in the 24 hours ending 05/01/14 1042 TELEMETRY: Reviewed telemetry pt in NSR:     PHYSICAL EXAM General: Well developed, well nourished, in no acute distress Head: Eyes PERRLA, No xanthomas.   Normal cephalic and atramatic  Lungs:   Clear bilaterally to auscultation and percussion. Heart:   HRRR S1 S2 Pulses are 2+ & equal. Abdomen: Bowel sounds are positive, abdomen soft and non-tender without masses  Extremities:   No clubbing, cyanosis or edema.  DP +1 Neuro: Alert and oriented X 3. Psych:  Good affect, responds appropriately   LABS: Basic Metabolic Panel:  Recent Labs  16/10/96 1538  NA 139  K 4.0  CL 105  CO2 25  GLUCOSE 92  BUN 20  CREATININE 1.00  CALCIUM 9.6   Liver Function Tests:  Recent Labs  04/29/14 1538  AST 18  ALT 21  ALKPHOS 62  BILITOT 0.6  PROT 7.2  ALBUMIN 4.1   No results for input(s): LIPASE, AMYLASE in the last 72 hours. CBC:  Recent Labs  04/29/14 1538  WBC 8.2  NEUTROABS 5.5  HGB 12.8*  HCT 39.1  MCV 78.0  PLT 274   Cardiac Enzymes: No results for input(s): CKTOTAL, CKMB, CKMBINDEX, TROPONINI in the last 72 hours. BNP: Invalid input(s): POCBNP D-Dimer: No results for input(s): DDIMER in the last 72 hours. Hemoglobin A1C: No results for input(s): HGBA1C in the last 72 hours. Fasting Lipid Panel: No results for input(s): CHOL, HDL, LDLCALC, TRIG, CHOLHDL, LDLDIRECT in the last 72 hours. Thyroid Function Tests: No results for input(s): TSH, T4TOTAL, T3FREE, THYROIDAB in the last 72 hours.  Invalid input(s):  FREET3 Anemia Panel: No results for input(s): VITAMINB12, FOLATE, FERRITIN, TIBC, IRON, RETICCTPCT in the last 72 hours. Coag Panel:   Lab Results  Component Value Date   INR 1.40 02/13/2014   INR 1.60* 02/12/2014   INR 2.61* 02/12/2014    RADIOLOGY: Dg Chest 2 View  04/30/2014   CLINICAL DATA:  Endocarditis with history of previous tricuspid valve and aortic root replacement  EXAM: CHEST  2 VIEW  COMPARISON:  Portable chest x-ray of April 29, 2014  FINDINGS: There has been interval withdrawal of the right-sided PICC line since the tip projects over the midportion of the SVC. The lungs are adequately inflated. There is no focal infiltrate. The heart and pulmonary vascularity are normal. There are 8 intact sternal wires. The permanent pacemaker is in appropriate position.  IMPRESSION: Interval repositioning of the PICC line. There is no acute cardiopulmonary abnormality.   Electronically Signed   By: David  [place]   On: 04/30/2014 07:46   Dg Chest 2 View  04/01/2014   CLINICAL DATA:  29 year old male with personal history of bacterial endocarditis and prior surgery. Subsequent encounter.  EXAM: CHEST  2 VIEW  COMPARISON:  03/15/2014 and earlier.  FINDINGS: Stable left chest cardiac pacemaker. Stable cardiac size and mediastinal contours. Stable lung volumes. Mild streaky  perihilar opacity has not significantly changed. No pneumothorax, pulmonary edema, pleural effusion or acute pulmonary opacity. Stable visualized osseous structures. Visualized tracheal air column is within normal limits.  IMPRESSION: Stable mild nonspecific perihilar opacity. No new cardiopulmonary abnormality.   Electronically Signed   By: Odessa Fleming M.D.   On: 04/01/2014 11:12   Dg Chest Port 1 View  04/29/2014   CLINICAL DATA:  PICC line placement  EXAM: PORTABLE CHEST - 1 VIEW  COMPARISON:  04/01/2014  FINDINGS: The right upper extremity PICC line extends into the right atrium. There are intact appearances of the transvenous  leads. There is prior sternotomy. Heart size is unchanged. Lungs are clear. No large effusions.  IMPRESSION: The new right upper extremity PICC line extends into the right atrium   Electronically Signed   By: Ellery Plunk M.D.   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  TEE showed lesion noted in in the outflow tract of the left ventricle and not on the aortic homograph leaflets.Unclear if infected vegetations or residual tissue from two previous operations persistence of his original group B streptococcal infection.  Remains afebrile.    2.  Functioning [employer] pacemaker for complete heart block  Plan: Continue cefepime, gentamicin and vancomycin.  Await blood culture results.  Appreciate CVTS and ID consults.     Quintella Reichert, MD  05/01/2014  10:42 AM

## 2014-05-01 NOTE — Progress Notes (Signed)
Patient ID: [name], male   DOB: 23-May-1985, 29 y.o.   MRN: [id number]      [address]       [name] [address]             [phone]                 2 Day Post-Op Procedure(s) (LRB): TRANSESOPHAGEAL ECHOCARDIOGRAM (TEE) (N/A)  LOS: 2 days   Subjective: He was a little sick to his stomach earlier, but no other complaints.  Objective: Vital signs in last 24 hours: Patient Vitals for the past 24 hrs:  BP Temp Temp src Pulse Resp SpO2 Height Weight  05/01/14 0500 - 97.8 F (36.6 C) Oral (!) 101 18 98 %  (1.651 m) 181 lb 12.8 oz (82.464 kg)  05/01/14 0435 (!) 96/53 mmHg - - - - - - -  04/30/14 2030 97/61 mmHg 97.5 F (36.4 C) Oral (!) 103 20 98 % - -  04/30/14 1525 (!) 104/58 mmHg 98.7 F (37.1 C) Oral (!) 123 20 99 % - -  04/30/14 1120 105/67 mmHg - - (!) 103 (!) 21 98 % - -  04/30/14 1100 109/69 mmHg - - (!) 122 (!) 26 97 % - -  04/30/14 1055 127/81 mmHg - - (!) 125 (!) 21 98 % - -  04/30/14 1050 115/69 mmHg - - (!) 130 20 97 % - -  04/30/14 1045 102/78 mmHg - - (!) 118 20 95 % - -    Filed Weights   04/29/14 1700 04/30/14 0500 05/01/14 0500  Weight: 181 lb 12.8 oz (82.464 kg) 181 lb (82.101 kg) 181 lb 12.8 oz (82.464 kg)           Scheduled Meds: . aspirin EC  325 mg Oral Daily  . ceFEPime (MAXIPIME) IV  2 g Intravenous Q12H  . gentamicin  80 mg Intravenous Q12H  . heparin  5,000 Units Subcutaneous 3 times per day  . multivitamin with minerals  1 tablet Oral Daily  . sodium chloride  10-40 mL Intracatheter Q12H  . vancomycin  1,000 mg Intravenous Q8H   Continuous Infusions:  PRN Meds:.acetaminophen, ALPRAZolam, bismuth subsalicylate, ondansetron (ZOFRAN) IV, sodium chloride, zolpidem  General appearance: alert, cooperative and no distress Heart: Tachy this am. Systolic murmur: early systolic 2/6, crescendo at lower left sternal border Lungs: clear to auscultation bilaterally Abdomen: soft, non-tender; bowel sounds normal; no masses,   no organomegaly Extremities: extremities normal, atraumatic, no cyanosis or edema and no evidence of emboli Wound: sternum stable  Lab Results: CBC:  Recent Labs  04/29/14 1538  WBC 8.2  HGB 12.8*  HCT 39.1  PLT 274   BMET:   Recent Labs  04/29/14 1538  NA 139  K 4.0  CL 105  CO2 25  GLUCOSE 92  BUN 20  CREATININE 1.00  CALCIUM 9.6    PT/INR: No results for input(s): LABPROT, INR in the last 72 hours.   Radiology Dg Chest 2 View  04/30/2014   CLINICAL DATA:  Endocarditis with history of previous tricuspid valve and aortic root replacement  EXAM: CHEST  2 VIEW  COMPARISON:  Portable chest x-ray of April 29, 2014  FINDINGS: There has been interval withdrawal of the right-sided PICC line since the tip projects over the midportion of the SVC. The lungs are adequately inflated. There is no focal infiltrate. The heart and pulmonary vascularity are normal. There are 8 intact sternal wires. The permanent pacemaker  is in appropriate position.  IMPRESSION: Interval repositioning of the PICC line. There is no acute cardiopulmonary abnormality.   Electronically Signed   By: David  [place]   On: 04/30/2014 07:46   Dg Chest Port 1 View  04/29/2014   CLINICAL DATA:  PICC line placement  EXAM: PORTABLE CHEST - 1 VIEW  COMPARISON:  04/01/2014  FINDINGS: The right upper extremity PICC line extends into the right atrium. There are intact appearances of the transvenous leads. There is prior sternotomy. Heart size is unchanged. Lungs are clear. No large effusions.  IMPRESSION: The new right upper extremity PICC line extends into the right atrium   Electronically Signed   By: Ellery Plunk M.D.   On: 04/29/2014 21:04     Assessment/Plan: S/P Procedure(s) (LRB): TRANSESOPHAGEAL ECHOCARDIOGRAM (TEE)   He remains afebrile. Per Dr. Tyrone Sage, lesion is in outflow tract of LV and NOT on aortic homograft leaflets. May be vegetation or residual tissue (had 2 operations). Given past history, may be  infection. On Maxipime, Vanco, and Garamycin. ID following. Zofran PRN nausea

## 2014-05-02 DIAGNOSIS — I38 Endocarditis, valve unspecified: Secondary | ICD-10-CM | POA: Insufficient documentation

## 2014-05-02 NOTE — Progress Notes (Addendum)
Patient ID: [name], male   DOB: 22-Dec-1985, 29 y.o.   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 aspirin EC  325 mg Oral Daily  . ceFEPime (MAXIPIME) IV  2 g Intravenous Q12H  . gentamicin  80 mg Intravenous Q12H  . heparin  5,000 Units Subcutaneous 3 times per day  . multivitamin with minerals  1 tablet Oral Daily  . sodium chloride  10-40 mL Intracatheter Q12H  . vancomycin  1,000 mg Intravenous Q8H    Subjective: Remains afebrile. Tolerating antibiotics   Addendum: spoke with Dr. Mayford Knife who evaluated serial echo and believes this is a new vegetation  OBJECTIVE: Blood pressure 95/56, pulse 86, temperature 98.3 F (36.8 C), temperature source Oral, resp. rate 13, height  (1.651 m), weight 181 lb (82.101 kg), SpO2 99 %. General: alert, no distress Lungs: CTAB, no w/c/r Cor: nl s1, s2, no longer tachy, LUSB murmur is faint, not as prominent as yesterday Skin: no stigmata of endocarditis/ no splinter or conjunctival hemorrhages Lab Results Lab Results  Component Value Date   WBC 5.4 05/01/2014   HGB 12.9* 05/01/2014   HCT 38.5* 05/01/2014   MCV 77.2* 05/01/2014   PLT 247 05/01/2014    Lab Results  Component Value Date   CREATININE 0.97 05/01/2014   BUN 15 05/01/2014   NA 137 05/01/2014   K 3.9 05/01/2014   CL 104 05/01/2014   CO2 26 05/01/2014    Lab Results  Component Value Date   ALT 21 04/29/2014   AST 18 04/29/2014   ALKPHOS 62 04/29/2014   BILITOT 0.6 04/29/2014     Microbiology: Blood cultures negative growth to date  IMaging:  ransesophageal echocardiogram 04/30/2014  - There is a large (1.5 x 1.1cm) mobile irregular bordered echodensity along the anterior aspect of the subvalvular apparatus of the bioprosthetic aortic valve in the left ventricular outflow tract consistent with vegetation. Although the valve leaflets do not seem to be compromised, there is reverberation artifact from bioprosthetic ring, and vegetation may be attached to base of the leaflet. There is perivalvular abscess like pocket/space in the posterior region of aortic valve adjacent to intraatrial septum.   - Pacer leads are normal - Hypermobile intraatrial septum  Assessment: 28yo M with native valve endocarditis 2/2 group b strep s/p aortic root and valve allograft in Nov 2015 s/p redo in Jan 2016 and 4 months of antibiotic therapy who is now found to have  lesion noted in in the outflow tract of the left ventricle and not on the aortic homograph leaflets.unclear if infected vegetations vs. residual tissue from two previous operations persistence of his original group B streptococcal infection. Recent evaluation of serial echos favors recurrent endocarditis  Plan: 1. We will continue with current antibiotics pending blood culture results. Currently NGTD at nearly 72hrs. Will provide final recs tomorrow. 2. Continue with therapeutic drug monitoring while on gent 3. We will treat  as culture negative endocarditis for 6 weeks. 4. Dr. Mayford Knife to discuss with CT surgery next plan.  Judyann Munson, MD [facility] [phone] pager   [phone] cell 05/02/2014, 11:15 AM

## 2014-05-02 NOTE — Progress Notes (Signed)
I have reviewed the findings with Dr. Delton See of his TEE Jan 2016 preop and postop and compared to the TEE done Friday 3/18.  There is clearly a new large mobile shaggy mass off the AV annulus consistent with vegetation.  I have spoken with Dr. Drue Second with ID.

## 2014-05-02 NOTE — Progress Notes (Signed)
SUBJECTIVE:  No complaints  OBJECTIVE:   Vitals:   Filed Vitals:   05/01/14 0500 05/01/14 1454 05/01/14 2100 05/02/14 0500  BP:  103/65 104/66 95/56  Pulse: 101 99 94 86  Temp: 97.8 F (36.6 C) 98.5 F (36.9 C) 97.7 F (36.5 C) 98.3 F (36.8 C)  TempSrc: Oral Oral    Resp: Height:  (1.651 m)     Weight: 181 lb 12.8 oz (82.464 kg)   181 lb (82.101 kg)  SpO2: 98% 99% 99% 99%   I&O's:   Intake/Output Summary (Last 24 hours) at 05/02/14 1038 Last data filed at 05/02/14 0900  Gross per 24 hour  Intake      0 ml  Output    325 ml  Net   -325 ml   TELEMETRY: Reviewed telemetry pt in NSR:     PHYSICAL EXAM General: Well developed, well nourished, in no acute distress Head: Eyes PERRLA, No xanthomas.   Normal cephalic and atramatic  Lungs:   Clear bilaterally to auscultation and percussion. Heart:   HRRR S1 S2 Pulses are 2+ & equal. Abdomen: Bowel sounds are positive, abdomen soft and non-tender without masses Extremities:   No clubbing, cyanosis or edema.  DP +1 Neuro: Alert and oriented X 3. Psych:  Good affect, responds appropriately   LABS: Basic Metabolic Panel:  Recent Labs  16/10/96 1538 05/01/14 1200  NA 139 137  K 4.0 3.9  CL 105 104  CO2 25 26  GLUCOSE 92 89  BUN 20 15  CREATININE 1.00 0.97  CALCIUM 9.6 9.6   Liver Function Tests:  Recent Labs  04/29/14 1538  AST 18  ALT 21  ALKPHOS 62  BILITOT 0.6  PROT 7.2  ALBUMIN 4.1   No results for input(s): LIPASE, AMYLASE in the last 72 hours. CBC:  Recent Labs  04/29/14 1538 05/01/14 1200  WBC 8.2 5.4  NEUTROABS 5.5 3.6  HGB 12.8* 12.9*  HCT 39.1 38.5*  MCV 78.0 77.2*  PLT 274 247   Cardiac Enzymes: No results for input(s): CKTOTAL, CKMB, CKMBINDEX, TROPONINI in the last 72 hours. BNP: Invalid input(s): POCBNP D-Dimer: No results for input(s): DDIMER in the last 72 hours. Hemoglobin A1C: No results for input(s): HGBA1C in the last 72 hours. Fasting Lipid  Panel: No results for input(s): CHOL, HDL, LDLCALC, TRIG, CHOLHDL, LDLDIRECT in the last 72 hours. Thyroid Function Tests: No results for input(s): TSH, T4TOTAL, T3FREE, THYROIDAB in the last 72 hours.  Invalid input(s): FREET3 Anemia Panel: No results for input(s): VITAMINB12, FOLATE, FERRITIN, TIBC, IRON, RETICCTPCT in the last 72 hours. Coag Panel:   Lab Results  Component Value Date   INR 1.40 02/13/2014   INR 1.60* 02/12/2014   INR 2.61* 02/12/2014    RADIOLOGY: Dg Chest 2 View  04/30/2014   CLINICAL DATA:  Endocarditis with history of previous tricuspid valve and aortic root replacement  EXAM: CHEST  2 VIEW  COMPARISON:  Portable chest x-ray of April 29, 2014  FINDINGS: There has been interval withdrawal of the right-sided PICC line since the tip projects over the midportion of the SVC. The lungs are adequately inflated. There is no focal infiltrate. The heart and pulmonary vascularity are normal. There are 8 intact sternal wires. The permanent pacemaker is in appropriate position.  IMPRESSION: Interval repositioning of the PICC line. There is no acute cardiopulmonary abnormality.   Electronically Signed   By: David  [place]   On: 04/30/2014 07:46  Dg Chest Port 1 View  04/29/2014   CLINICAL DATA:  PICC line placement  EXAM: PORTABLE CHEST - 1 VIEW  COMPARISON:  04/01/2014  FINDINGS: The right upper extremity PICC line extends into the right atrium. There are intact appearances of the transvenous leads. There is prior sternotomy. Heart size is unchanged. Lungs are clear. No large effusions.  IMPRESSION: The new right upper extremity PICC line extends into the right atrium   Electronically Signed   By: Ellery Plunk M.D.   On: 04/29/2014 21:04   ASSESSMENT AND PLAN  1. 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 TEE showed lesion noted in in the outflow tract of the left ventricle and not on the aortic homograph leaflets.Unclear if infected  vegetations or residual tissue from two previous operations persistence of his original group B streptococcal infection. Remains afebrile.   2. Functioning Medtronic pacemaker for complete heart block  3.  Sinus tachycardia with V pacing.  HR currently in the 90's  Plan: Continue cefepime, gentamicin and vancomycin. Await blood culture results. Appreciate CVTS and ID consults.      Quintella Reichert, MD  05/02/2014  10:38 AM

## 2014-05-03 ENCOUNTER — Encounter (HOSPITAL_COMMUNITY): Payer: Self-pay | Admitting: Cardiology

## 2014-05-03 DIAGNOSIS — Z952 Presence of prosthetic heart valve: Secondary | ICD-10-CM

## 2014-05-03 LAB — CBC WITH DIFFERENTIAL/PLATELET
Basophils Absolute: 0.1 10*3/uL (ref 0.0–0.1)
Basophils Relative: 1 % (ref 0–1)
EOS ABS: 0.6 10*3/uL (ref 0.0–0.7)
EOS PCT: 11 % — AB (ref 0–5)
HCT: 33.5 % — ABNORMAL LOW (ref 39.0–52.0)
Hemoglobin: 11.2 g/dL — ABNORMAL LOW (ref 13.0–17.0)
LYMPHS ABS: 1.2 10*3/uL (ref 0.7–4.0)
Lymphocytes Relative: 23 % (ref 12–46)
MCH: 26 pg (ref 26.0–34.0)
MCHC: 33.4 g/dL (ref 30.0–36.0)
MCV: 77.7 fL — AB (ref 78.0–100.0)
MONOS PCT: 14 % — AB (ref 3–12)
Monocytes Absolute: 0.7 10*3/uL (ref 0.1–1.0)
Neutro Abs: 2.7 10*3/uL (ref 1.7–7.7)
Neutrophils Relative %: 51 % (ref 43–77)
Platelets: 178 10*3/uL (ref 150–400)
RBC: 4.31 MIL/uL (ref 4.22–5.81)
RDW: 15.6 % — ABNORMAL HIGH (ref 11.5–15.5)
WBC: 5.2 10*3/uL (ref 4.0–10.5)

## 2014-05-03 LAB — BASIC METABOLIC PANEL
Anion gap: 5 (ref 5–15)
BUN: 19 mg/dL (ref 6–23)
CO2: 29 mmol/L (ref 19–32)
CREATININE: 0.97 mg/dL (ref 0.50–1.35)
Calcium: 8.7 mg/dL (ref 8.4–10.5)
Chloride: 103 mmol/L (ref 96–112)
GLUCOSE: 186 mg/dL — AB (ref 70–99)
Potassium: 3.9 mmol/L (ref 3.5–5.1)
Sodium: 137 mmol/L (ref 135–145)

## 2014-05-03 LAB — TSH: TSH: 2.109 u[IU]/mL (ref 0.350–4.500)

## 2014-05-03 MED ORDER — VANCOMYCIN HCL IN DEXTROSE 1-5 GM/200ML-% IV SOLN: 1000.0000 mg | Freq: Three times a day (TID) | INTRAVENOUS | Status: DC

## 2014-05-03 MED ORDER — DEXTROSE 5 % IV SOLN: 2.0000 g | Freq: Two times a day (BID) | INTRAVENOUS | Status: DC

## 2014-05-03 MED ORDER — HEPARIN SOD (PORK) LOCK FLUSH 100 UNIT/ML IV SOLN
250.0000 [IU] | INTRAVENOUS | Status: AC | PRN
  Administered 2014-05-03: 250 [IU]
  Filled 2014-05-03: qty 5

## 2014-05-03 NOTE — Progress Notes (Signed)
Patient has met adequate criteria for discharge per MD order. Patient discharge summary was printed and given to patient. Follow-up appointments, new medications, and required education was gone over with patient. Home health services met with patient to discuss treatment outside of the hospital. All hospital equipment was removed. Double lumen PICC line was heparinized per protocol. Patient left the hospital with friend and patient belongings in hand. 

## 2014-05-03 NOTE — Care Management Note (Signed)
    Page 1 of 1   05/03/2014     1:30:58 PM CARE MANAGEMENT NOTE 05/03/2014  Patient:  [name], [name]   Account Number:  [id number]  Date Initiated:  05/03/2014  Documentation initiated by:  GRAVES-BIGELOW,Maghan Jessee  Subjective/Objective Assessment:   Pt admitted for Aortic valve vegetation. Plan is for d/c home today with IV ABX therapy. Pt has used [facility] in the past and would like to use again.     Action/Plan:   CM did make referral with [facility] and SOC to begin within 24-48 hours of d/c.   Anticipated DC Date:  05/03/2014   Anticipated DC Plan:  HOME W HOME HEALTH SERVICES      DC Planning Services  CM consult      [facility] Choice  HOME HEALTH   Choice offered to / List presented to:  C-1 Patient   DME arranged  IV PUMP/EQUIPMENT      DME agency  [facility]     [facility] arranged  HH-1 RN  HH-10 DISEASE MANAGEMENT      Status of service:  Completed, signed off Medicare Important Message given?  NO (If response is "NO", the following Medicare IM given date fields will be blank) Date Medicare IM given:  05/03/2014 Medicare IM given by:  GRAVES-BIGELOW,Sahara Fujimoto Date Additional Medicare IM given:   Additional Medicare IM given by:    Discharge Disposition:  HOME W HOME HEALTH SERVICES  Per UR Regulation:  Reviewed for med. necessity/level of care/duration of stay  If discussed at Long Length of Stay Meetings, dates discussed:    Comments:

## 2014-05-03 NOTE — Discharge Summary (Signed)
Physician Discharge Summary     Cardiologist:  Crenshaw Patient ID: [name] MRN: [id number] DOB/AGE: [birth year] 29 y.o.  Admit date: 04/29/2014 Discharge date: 05/03/2014  Admission Diagnoses:  Aortic valve vegetation on echo 04/29/14  Discharge Diagnoses:  Principal Problem:   Aortic valve vegetation on echo 04/29/14 Active Problems:   Endocarditis of aortic valve-November 2015   Pacemaker- MDT Nov 2015   S/P aortic root and valve allograft Nov 2015with re do surgery Jan 1st 2016   Endocarditis   Discharged Condition: stable  Hospital Course:   29 y/o male initially seen in Nov 2015 with "flu" that ended up being bacterial endocarditis. He underwent aortic root and valve replacement with a fistula repair in Nov. At that time he also had CHB and had a MDT PTVDP placed. In Jan he had symptoms of dyspnea and was re evaluated. A TEE was done and and revealed a large VSD with left to right shunting from LVOT. On 02/12/2014 he underwent Redo Median Sternotomy, Repair of a VSD, Tricuspid Valve Repair, and Redo closure of PFO.  He did well but recently a new murmur was heard. An echo was done 04/29/14 and revealed an aortic vegetation. He was admitted now for further evaluation.  He was afebrile the entire admission.  Infectious disease was consulted and he was started on IV vancomycin, gentamicin and cefepime.  Blood cultures were drawn and negative.  TEE revealed a NEW, large (1.5 x 1.1cm) mobile irregular bordered echodensity along the anterior aspect of the subvalvular apparatus of the bioprosthetic aortic valve in the left ventricular outflow tract consistent with vegetation.  He was seen by Dr. Tyrone Sage who recommended against a 3rd time redo.  Per Dr. Orvan Falconer, the patient will be continued on IV cefepime and vancomycin through June 07, 2014.  [facility] will administer.  The patient was seen by Dr. Royann Shivers who felt he was stable for DC home.  Follow up appt arranged with  ID and Cardiology.    Consults: Infectious Disease, TCTS  Significant Diagnostic Studies:  TEE  Indications: bioprosthetic AV - vegetation?  Findings:   - There is a large (1.5 x 1.1cm) mobile irregular bordered echodensity along the anterior aspect of the subvalvular apparatus of the bioprosthetic aortic valve in the left ventricular outflow tract consistent with vegetation. Although the valve leaflets do not seem to be compromised, there is reverberation artifact from bioprosthetic ring, and vegetation may be attached to base of the leaflet. There is perivalvular abscess like pocket/space in the posterior region of aortic valve adjacent to intraatrial septum.   - Pacer leads are normal - EF normal - Mild AI - Mild MR - Mild TR - Hypermobile intraatrial septum  Discussed with he and family. Let Ryan know with TCTS.   Donato Schultz, MD  Treatments: See above  Discharge Exam: Blood pressure 117/63, pulse 93, temperature 98.5 F (36.9 C), temperature source Oral, resp. rate 18, height  (1.651 m), weight 176 lb 8 oz (80.06 kg), SpO2 97 %.   Disposition: 06-Home-Health Care Svc      Discharge Instructions    Diet - low sodium heart healthy    Complete by:  As directed      Increase activity slowly    Complete by:  As directed             Medication List    STOP taking these medications        amoxicillin 500 MG capsule  Commonly known as:  AMOXIL      TAKE these medications        aspirin 325 MG EC tablet  Take 1 tablet (325 mg total) by mouth daily.     ceFEPIme 2 g in dextrose 5 % 50 mL  Inject 2 g into the vein every 12 (twelve) hours.     vancomycin 1 GM/200ML Soln  Commonly known as:  VANCOCIN  Inject 200 mLs (1,000 mg total) into the vein every 8 (eight) hours.       Follow-up Information    Follow up with [facility].   Why:  Registered Nurse.    Contact information:   [address] [place]  [address] [phone]       Follow up with Olga Millers, MD On 05/17/2014.   Specialty:  Cardiology   Why:  3:45 PM   Contact information:   [address] [place] [address] [phone]       Follow up with Acey Lav, MD On 05/11/2014.   Specialty:  Infectious Diseases   Why:  3:45 PM   Contact information:   [address] [place] [address] [phone]      Greater than 30 minutes was spent completing the patient's discharge.    SignedWilburt Finlay, PAC 05/03/2014, 1:58 PM

## 2014-05-03 NOTE — Progress Notes (Signed)
Patient ID: [name], male   DOB: 31-Jul-1985, 29 y.o.   MRN: [id number]         [facility]    Date of Admission:  04/29/2014   Continuous antibiotic therapy since November 2015        Day 4 vancomycin        Day 4 gentamicin        Day 4 cefepime  Principal Problem:   Aortic valve vegetation on echo 04/29/14 Active Problems:   Endocarditis of aortic valve-November 2015   Pacemaker- MDT Nov 2015   S/P aortic root and valve allograft Nov 2015with re do surgery Jan 1st 2016   Endocarditis   . aspirin EC  325 mg Oral Daily  . ceFEPime (MAXIPIME) IV  2 g Intravenous Q12H  . gentamicin  80 mg Intravenous Q12H  . heparin  5,000 Units Subcutaneous 3 times per day  . multivitamin with minerals  1 tablet Oral Daily  . sodium chloride  10-40 mL Intracatheter Q12H  . vancomycin  1,000 mg Intravenous Q8H    Subjective: He continues to feel well.  Review of Systems: Pertinent items are noted in HPI.  Past Medical History  Diagnosis Date  . Obesity   . Smokeless tobacco use   . Fracture of left femur   . Meningitis     Group B Strep (November 2015)  . Patent foramen ovale     Closed during procedure on December 30, 2013  . History of open heart surgery     December 30, 2013- ascending aortic root replacement, TEE  . Endocarditis     related to meningitis   . Brain abscess   . Presence of permanent cardiac pacemaker 01/04/2014    PPM MEDTRONIC FOR COMPLETE HEART BLOCK  . Thrombocytopenia 12/2013  . AKI (acute kidney injury)   . 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 Tricuspid regurgitation 02/11/2014  . 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 S/P ventricular septal defect repair + redo tricuspid valve repair + redo closure PFO 02/12/2014    Redo  sternotomy for bovine pericardial patch repair of large ventricular septal defect (recurrent LVOT to RA fistula due to bacterial endocarditis)  . S/P redo tricuspid valve repair 02/12/2014    Complex valvuloplasty including patch closure of VSD with plication of septal leaflet and 26 mm Edwards mc3 ring annuloplasty  . S/P redo patent foramen ovale closure 02/12/2014  . S/P placement of cardiac pacemaker 01/04/2014    Medtronic (serial number [id number]) dual chamber permanent pacemaker implanted by Dr Ladona Ridgel  . moderate residual aortic insufficiency s/p allograft aortic root replacement 03/08/2014    History  Substance Use Topics  . Smoking status: Never Smoker   . Smokeless tobacco: Former Neurosurgeon    Quit date: 12/30/2013  . Alcohol Use: Yes    Family History  Problem Relation Age of Onset  . Heart murmur Brother    No Known Allergies  OBJECTIVE: Blood pressure 117/63, pulse 93, temperature 98.5 F (36.9 C), temperature source Oral, resp. rate 18, height 5\' 5"  (1.651 m), weight 176 lb 8 oz (80.06 kg), SpO2 97 %. General: alert and smiling Skin: no splinter or conjunctival hemorrhages Lungs: clear Cor: 2/6 systolic murmur  Lab Results Lab Results  Component Value Date  WBC 5.2 05/03/2014   HGB 11.2* 05/03/2014   HCT 33.5* 05/03/2014   MCV 77.7* 05/03/2014   PLT 178 05/03/2014    Lab Results  Component Value Date   CREATININE 0.97 05/03/2014   BUN 19 05/03/2014   NA 137 05/03/2014   K 3.9 05/03/2014   CL 103 05/03/2014   CO2 29 05/03/2014    Lab Results  Component Value Date   ALT 21 04/29/2014   AST 18 04/29/2014   ALKPHOS 62 04/29/2014   BILITOT 0.6 04/29/2014     Microbiology: Blood cultures 04/29/2014: negative  Transesophageal echocardiogram 04/30/2014  - There is a large (1.5 x 1.1cm) mobile irregular bordered echodensity along the anterior aspect of the subvalvular apparatus of the bioprosthetic aortic valve in the left ventricular outflow tract  consistent with vegetation. Although the valve leaflets do not seem to be compromised, there is reverberation artifact from bioprosthetic ring, and vegetation may be attached to base of the leaflet. There is perivalvular abscess like pocket/space in the posterior region of aortic valve adjacent to intraatrial septum.   - Pacer leads are normal - EF normal - Mild AI - Mild MR - Mild TR - Hypermobile intraatrial septum  Assessment: Given his very complicated course over the past several months and new vegetation I think it is best that we give him another round of IV antibiotic therapy. I will continue vancomycin and cefepime and plan on 6 weeks of therapy. Since staph aureus is now been isolated I will not treat him with outpatient gentamicin given the significant risk of nephrotoxicity.  Plan: 1. Continue vancomycin and cefepime for 6 weeks through 06/07/2014 2. I will arrange followup in our clinic  Cliffton Asters, MD [facility] [phone] pager   [phone] cell 05/03/2014, 12:40 PM

## 2014-05-03 NOTE — Progress Notes (Signed)
Subjective: No complaints  Objective: Vital signs in last 24 hours: Temp:  [98 F (36.7 C)-98.5 F (36.9 C)] 98.5 F (36.9 C) (03/21 0500) Pulse Rate:  [84-93] 93 (03/21 0500) Resp:  [18] 18 (03/21 0500) BP: (104-117)/(55-63) 117/63 mmHg (03/21 0500) SpO2:  [97 %-99 %] 97 % (03/21 0500) Weight:  [176 lb 8 oz (80.06 kg)] 176 lb 8 oz (80.06 kg) (03/21 0500) Last BM Date: 05/01/14  Intake/Output from previous day: 03/20 0701 - 03/21 0700 In: 310 [I.V.:10; IV Piggyback:300] Out: 775 [Urine:775] Intake/Output this shift:    Medications Current Facility-Administered Medications  Medication Dose Route Frequency Provider Last Rate Last Dose  . acetaminophen (TYLENOL) tablet 650 mg  650 mg Oral Q4H PRN Erlene Quan, PA-C      . ALPRAZolam Duanne Moron) tablet 0.25 mg  0.25 mg Oral BID PRN Erlene Quan, PA-C      . aspirin EC tablet 325 mg  325 mg Oral Daily Erlene Quan, PA-C   325 mg at 05/02/14 0737  . bismuth subsalicylate (PEPTO BISMOL) 262 MG/15ML suspension 30 mL  30 mL Oral Q6H PRN Doreene Burke [place], PA-C      . ceFEPIme (MAXIPIME) 2 g in dextrose 5 % 50 mL IVPB  2 g Intravenous Q12H Lelon Perla, MD   2 g at 05/02/14 2307  . gentamicin (GARAMYCIN) IVPB 80 mg  80 mg Intravenous Q12H Lelon Perla, MD   80 mg at 05/02/14 2307  . heparin injection 5,000 Units  5,000 Units Subcutaneous 3 times per day Erlene Quan, PA-C   5,000 Units at 05/03/14 1062  . multivitamin with minerals tablet 1 tablet  1 tablet Oral Daily Erlene Quan, PA-C   1 tablet at 05/02/14 [phone]  . ondansetron (ZOFRAN) injection 4 mg  4 mg Intravenous Q6H PRN Doreene Burke [place], PA-C      . sodium chloride 0.9 % injection 10-40 mL  10-40 mL Intracatheter PRN Lelon Perla, MD      . sodium chloride 0.9 % injection 10-40 mL  10-40 mL Intracatheter Q12H Lelon Perla, MD   10 mL at 05/02/14 2254  . vancomycin (VANCOCIN) IVPB 1000 mg/200 mL premix  1,000 mg Intravenous Q8H Donalynn Furlong [place], RPH   1,000 mg at  05/03/14 5462  . zolpidem (AMBIEN) tablet 5 mg  5 mg Oral QHS PRN,MR X 1 Luke K Desloge, PA-C   5 mg at 05/03/14 0018    PE: General appearance: alert, cooperative and no distress Lungs: clear to auscultation bilaterally Heart: regular rate and rhythm and 2/6 sys MM Extremities: No LEE Pulses: 2+ and symmetric Skin: Warm and dry Neurologic: Grossly normal  Lab Results:   Recent Labs  05/01/14 1200 05/03/14 0615  WBC 5.4 5.2  HGB 12.9* 11.2*  HCT 38.5* 33.5*  PLT 247 178   BMET  Recent Labs  05/01/14 1200 05/03/14 0615  NA 137 137  K 3.9 3.9  CL 104 103  CO2 26 29  GLUCOSE 89 186*  BUN 15 19  CREATININE 0.97 0.97  CALCIUM 9.6 8.7     Assessment/Plan    Endocarditis of the homograft aortic valve replacement. TEE shows new large mobile shaggy mass off the AV annulus consistent with vegetation.Unclear if infected vegetations or residual tissue from two previous operations persistence of his original group B streptococcal infection. Remains afebrile. Continue cefepime, gentamicin and vancomycin. Await blood culture results(No growth to date). Appreciate CVTS and ID  consults.  BP well controlled    Endocarditis of aortic valve-November 2015   Pacemaker- MDT Nov 2015   S/P aortic root and valve allograft Nov 2015with re do surgery Jan 1st 2016     LOS: 4 days    HAGER, BRYAN PA-C 05/03/2014 7:54 AM  I have seen and examined the patient along with HAGER, BRYAN PA-C.  I have reviewed the chart, notes and new data.  I agree with PA's note.  Key new complaints: feels well Key examination changes: no fever or peripheral stigmata of endocarditis Key new findings / data: TEE report reviewed; blood culture no growth. WBC normal. ESR only 27.  PLAN: Empirical 6 weeks of Rx for culture negative endocarditis. Hopefully "false alarm" due to postop changes.   , MD, FACC [facility] [phone] 05/03/2014, 11:37 AM  

## 2014-05-03 NOTE — Progress Notes (Signed)
[facility]   Patient Status:   New pt for [facility] this admission, though pt has been with [facility] in the past.  [facility] is providing the following services: [facility] and [facility] for home IV ABX. [facility] hospital team will follow the patient and provide in hospital teaching re: home IV ABX self administration to support transition home when ordered.  If patient discharges after hours, please call [phone].   Sedalia Muta 05/03/2014, 12:53 PM

## 2014-05-06 LAB — CULTURE, BLOOD (ROUTINE X 2)
Culture: NO GROWTH
Culture: NO GROWTH

## 2014-05-10 ENCOUNTER — Telehealth: Payer: Self-pay | Admitting: Licensed Clinical Social Worker

## 2014-05-10 NOTE — Telephone Encounter (Signed)
Carolyn,RN with [facility] called to let us know that she could not get blood from PICC line today, and insertion site is a little red. Patient is not complaining of pain, and is able to push IV antibiotics through. Patient has an appointment tomorrow at 3:45 with Dr. Daiva Eves

## 2014-05-11 ENCOUNTER — Encounter: Payer: Self-pay | Admitting: Infectious Disease

## 2014-05-11 ENCOUNTER — Ambulatory Visit (INDEPENDENT_AMBULATORY_CARE_PROVIDER_SITE_OTHER): Payer: BC Managed Care – PPO | Admitting: Infectious Disease

## 2014-05-11 VITALS — BP 116/80 | HR 91 | Temp 97.6°F | Wt 195.0 lb

## 2014-05-11 DIAGNOSIS — Z9889 Other specified postprocedural states: Secondary | ICD-10-CM

## 2014-05-11 DIAGNOSIS — I330 Acute and subacute infective endocarditis: Secondary | ICD-10-CM

## 2014-05-11 DIAGNOSIS — I442 Atrioventricular block, complete: Secondary | ICD-10-CM | POA: Diagnosis not present

## 2014-05-11 DIAGNOSIS — Z953 Presence of xenogenic heart valve: Secondary | ICD-10-CM

## 2014-05-11 DIAGNOSIS — Z8774 Personal history of (corrected) congenital malformations of heart and circulatory system: Secondary | ICD-10-CM

## 2014-05-11 DIAGNOSIS — I358 Other nonrheumatic aortic valve disorders: Secondary | ICD-10-CM | POA: Diagnosis not present

## 2014-05-11 DIAGNOSIS — Z954 Presence of other heart-valve replacement: Secondary | ICD-10-CM

## 2014-05-11 DIAGNOSIS — A401 Sepsis due to streptococcus, group B: Secondary | ICD-10-CM

## 2014-05-11 DIAGNOSIS — Z950 Presence of cardiac pacemaker: Secondary | ICD-10-CM

## 2014-05-11 NOTE — Progress Notes (Signed)
HPI: FU AVR; initially seen in Nov 2015 with "flu" that ended up being bacterial endocarditis. He underwent aortic root and valve replacement with a fistula repair in Nov. At that time he also had CHB and had a MDT PTVDP placed. Jan 2015 he had symptoms of dyspnea and was re evaluated. A TEE was done and and revealed a large VSD with left to right shunting from LVOT. On 02/12/2014 he underwent Redo Median Sternotomy, Repair of a VSD, Tricuspid Valve Repair, and Redo closure of PFO.patient had a repeat echocardiogram on 04/29/2014 that showed mild global reduction in LV function. There was a mobile mass on the aortic valve concerning for vegetation as well as mild to moderate aortic insufficiency. The patient was admitted and IV antibiotics resumed. Blood cultures remain negative. Transesophageal echocardiogram revealed low normal LV function and an aortic valve mass. 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 Patient was evaluated by Dr. Tyrone Sage and medical therapy recommended. Since discharge, no chest pain, dyspnea, fevers or chills. No.  Current Outpatient Prescriptions  Medication Sig Dispense Refill  . aspirin EC 325 MG EC tablet Take 1 tablet (325 mg total) by mouth daily. 30 tablet 0  . ceFEPIme 2 g in dextrose 5 % 50 mL Inject 2 g into the vein every 12 (twelve) hours. (Patient taking differently: Inject 2 g into the vein 3 (three) times daily. ) 71 Dose 0  . VANCOMYCIN HCL IV Inject into the vein. 1.5 grams twice daily     No current facility-administered medications for this visit.     Past Medical History  Diagnosis Date  . Obesity   . Smokeless tobacco use   . Fracture of left femur   . Meningitis     Group B Strep (November 2015)  . Patent foramen ovale     Closed during procedure on December 30, 2013  . History of open heart surgery     December 30, 2013- ascending aortic root replacement, TEE  . Endocarditis     related to meningitis     . Brain abscess   . Presence of permanent cardiac pacemaker 01/04/2014    PPM MEDTRONIC FOR COMPLETE HEART BLOCK  . Thrombocytopenia 12/2013  . AKI (acute kidney injury)   . 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 Tricuspid regurgitation 02/11/2014  . 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 S/P ventricular septal defect repair + redo tricuspid valve repair + redo closure PFO 02/12/2014    Redo sternotomy for bovine pericardial patch repair of large ventricular septal defect (recurrent LVOT to RA fistula due to bacterial endocarditis)  . S/P redo tricuspid valve repair 02/12/2014    Complex valvuloplasty including patch closure of VSD with plication of septal leaflet and 26 mm Edwards mc3 ring annuloplasty  . S/P redo patent foramen ovale closure 02/12/2014  . S/P placement of cardiac pacemaker 01/04/2014    [employer] (serial number [id number]) dual chamber permanent pacemaker implanted by Dr Ladona Ridgel  . moderate residual aortic insufficiency s/p allograft aortic root replacement 03/08/2014    Past Surgical History  Procedure Laterality Date  . Ascending aortic root replacement N/A 12/30/2013    Procedure: ASCENDING AORTIC ROOT REPLACEMENT with 23mm HOMOGRAFT, CLOSURE OF AORTIC TO RIGHT ATRIAL FISTULA, CLOSURE OF PATENT FORAMEN OVALE;  Surgeon: Delight Ovens, MD;  Location[facility] OR;  Service: Open Heart Surgery;  Laterality: N/A;  . Tricuspid valve replacement N/A 12/30/2013    Procedure: REPAIR OF TRICUSPID VALVE LEAFLET;  Surgeon: Delight Ovens, MD;  Location: [facility] OR;  Service: Open Heart Surgery;  Laterality: N/A;  . Intraoperative transesophageal echocardiogram N/A 12/30/2013    Procedure: INTRAOPERATIVE TRANSESOPHAGEAL ECHOCARDIOGRAM;  Surgeon: Delight Ovens, MD;  Location: [facility] OR;   Service: Open Heart Surgery;  Laterality: N/A;  . Permanent pacemaker insertion N/A 01/04/2014    Procedure: PERMANENT PACEMAKER INSERTION;  Surgeon: Marinus Maw, MD;  Location: [facility] CATH LAB;  Service: Cardiovascular;  Laterality: N/A;  . Insert / replace / remove pacemaker  01/04/2014  . Right heart catheterization N/A 02/12/2014    Procedure: RIGHT HEART CATH;  Surgeon: Laurey Morale, MD;  Location: [facility] CATH LAB;  Service: Cardiovascular;  Laterality: N/A;  . Tee without cardioversion N/A 02/12/2014    Procedure: TRANSESOPHAGEAL ECHOCARDIOGRAM (TEE);  Surgeon: Quintella Reichert, MD;  Location: [facility] ENDOSCOPY;  Service: Cardiovascular;  Laterality: N/A;  . Tricuspid valve replacement N/A 02/12/2014    Procedure: TRICUSPID VALVE REPAIR, REDO MEDIAN STERNOTOMY, REPAIR OF VENTRICULAR SEPTAL DEFECT (RECURRENT LV OUTFLOW TRACT TO RIGHT ATRIAL FISTULA), REDO CLOSURE OF PATENT FORAMEN OVALE;  Surgeon: Purcell Nails, MD;  Location: [facility] OR;  Service: Open Heart Surgery;  Laterality: N/A;  . Tee without cardioversion N/A 04/30/2014    Procedure: TRANSESOPHAGEAL ECHOCARDIOGRAM (TEE);  Surgeon: Jake Bathe, MD;  Location: [facility] ENDOSCOPY;  Service: Cardiovascular;  Laterality: N/A;    History   Social History  . Marital Status: Single    Spouse Name: N/A  . Number of Children: N/A  . Years of Education: N/A   Occupational History  . Not on file.   Social History Main Topics  . Smoking status: Never Smoker   . Smokeless tobacco: Former Neurosurgeon    Quit date: 12/30/2013  . Alcohol Use: Yes  . Drug Use: No  . Sexual Activity: Yes    Birth Control/ Protection: Condom   Other Topics Concern  . Not on file   Social History Narrative    ROS: no fevers or chills, productive cough, hemoptysis, dysphasia, odynophagia, melena, hematochezia, dysuria, hematuria, rash, seizure activity, orthopnea, PND, pedal edema, claudication. Remaining systems are negative.  Physical Exam: Well-developed well-nourished in no  acute distress.  Skin is warm and dry.  HEENT is normal.  Neck is supple.  Chest is clear to auscultation with normal expansion.  Cardiovascular exam is regular rate and rhythm. 2/6 systolic murmur left sternal border. No diastolic murmur. Abdominal exam nontender or distended. No masses palpated. Extremities show no edema. neuro grossly intact No peripheral stigmata of SBE.

## 2014-05-11 NOTE — Progress Notes (Signed)
Subjective:    Patient ID: [name], male    DOB: 05-22-1985, 29 y.o.   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 Valve sent to [facility] showed GBS by ribosomal 16S sequencing.   He has had trouble tolerating the high dose ceftriaxone and also completed 2 weeks of gentamicin. He was on Rocephin when I saw him on 02/02/14 and getting used to the abx but a few days later was found to have worsening dyspnea, heart failure change in heart sounds and was admitted and found  To have new Ventricular Septal Defect (recurrent LV outflow tract to right atrial fistula, Severe Tricuspid Regurgitatio, Recurrent Patent Foramen Ovale, and Acute Congestive Heart Failure.   He underwent :   Redo Median Sternotomy  Repair of Ventricular Septal Defect Double layer bovine pericardial patch closure  Tricuspid Valve Repair Complex Valvuloplasty including plication of septal leaflet Edwards mc3 Ring annuloplasty  On January 1st, 2016. Tissue was sent off to [facility] where PCR of Ribosome AGAIN WAS POSITIVE FOR GROUP B STREPTOCOCCUS.      NOTE Pacemaker from first CT surgery had remained in place through 2nd CT surgery and he is Pacer dependent.   IN the interim he was treated withn Rocephin high dose along with 2 weeks of gentamicin (which he has finished).  His repeat Echo shows stability of his postoperative repairs.  Since I last saw him he was on oral amoxicillin and without any symptoms. His fiance was resting her head on his chest and discovered a new murmur.  He was evaluated and had 2d echo showing  mobile vegetation measuring 1.3 x  1.7 cm seemingly attached to the bioprosthetic aortic valve,with mild to moderate AI on 04/29/14.  He was brought into the hospital and underwent TEE which read as showing:  The same large (1.5 x 1.1cm) mobile irregular bordered echodensity along the anterior aspect of the subvalvular apparatus of the bioprosthetic aortic valve in the left ventricular outflow tract consistent with vegetation. There is perivalvular abscess like pocket/space in the posterior region of aortic valve adjacent to intraatrial septum. There was mild regurgitation.  He had blood cultures taken on amoxicillin which were negative and he was broadened ultimately to vancomycin and cefepime.   He was followed closely by Cardiology and CT surgery.   Dr. Tyrone Sage reviewed the TEE and noted that the  Lesion was in the outflow tract of the left ventricle and not on the aortic homograph leaflets. He did not recommend repeat 3rd open heart surgery.  Patient has been dc to home on IV vancomycin and cefepime. He feels well but is understandably anxious about this severe heart valve infection.   Review of Systems  Constitutional: Negative for fever, chills, diaphoresis, activity change and appetite change.  HENT: Negative for congestion, rhinorrhea, sinus pressure, sneezing, sore throat and trouble swallowing.   Eyes: Negative for photophobia and visual disturbance.  Respiratory: Negative for cough, chest tightness, shortness of breath, wheezing and stridor.   Cardiovascular: Negative for chest pain, palpitations and leg swelling.  Gastrointestinal: Negative for nausea, vomiting, diarrhea, constipation, blood in stool and abdominal distention.  Genitourinary: Negative for dysuria, hematuria, flank pain and difficulty urinating.  Musculoskeletal:  Negative for myalgias, back pain, joint swelling, arthralgias and gait problem.  Skin: Negative for color change, pallor, rash and wound.  Neurological: Negative for  dizziness, tremors, weakness and light-headedness.  Hematological: Negative for adenopathy. Does not bruise/bleed easily.  Psychiatric/Behavioral: Negative for behavioral problems, confusion, sleep disturbance, dysphoric mood, decreased concentration and agitation.       Objective:   Physical Exam  Constitutional: He is oriented to person, place, and time. He appears well-developed and well-nourished.  HENT:  Head: Normocephalic and atraumatic.  Eyes: Conjunctivae and EOM are normal.  Neck: Normal range of motion. Neck supple.  Cardiovascular: Regular rhythm.  Bradycardia present.   Murmur heard.  Systolic murmur is present with a grade of 3/6    Pulmonary/Chest: Effort normal. No respiratory distress. He has no wheezes.  Abdominal: Soft. He exhibits no distension.  Musculoskeletal: Normal range of motion. He exhibits no edema or tenderness.  Neurological: He is alert and oriented to person, place, and time.  Skin: Skin is warm and dry. No rash noted. No erythema. No pallor.  Psychiatric: He has a normal mood and affect. His behavior is normal. Judgment and thought content normal.   PM site clean today as it was on  02/02/14      Sternotomy clean :     PICC line clean          Assessment & Plan:   Group B streptococcal  Aortic valve endocarditis and Aorto-Right Atrial fistula sp AVR, TV repair, repair of Aorto to Right atrial fistula, placement of PM due to 3rd degree heart block, then Ventricular Septal Defect (recurrent LV outflow tract to right atrial fistula, Severe Tricuspid Regurgitatio, Recurrent Patent Foramen Ovale, and Acute Congestive Heart Failure. Sp Redo Median Sternotom, Repair of Ventricular Septal Defect, Double layer bovine pericardial patch closure, Tricuspid Valve Repair, Complex Valvuloplasty including plication of septal leaflet, Edwards mc3 Ring annuloplasty with Group B Streptococcus isolated from heart valve sp repeat IV rocephin x 6 weeks with 2  weeks of gentamicin, changed to oral amoxicillin but now yet again with evidence of large vegetation + possible abscess vs cavity. Blood cultures are NG on amoxicillin and he was broadened IV vancomycin cefepime.  I am worried that his Pacemaker that was placed with his original surgery when he had complete heart block and which has remained in place could be a nidus for recurrent progressive destructive infection of his heart valve.   He has had two rounds of IV antibiotics after his first two open heart surgeries with appropriate abx with gentamicin and IV rocephin. GBS is exquisitely sensitive to beta lactams and this will not have changed for his organism.  It is more a question of the organism persisting here and given that he has foreign material with PM I have high amount of anxiety that the GBS may be indeed on these PM wires in addition to his prosthetic heart valve  I will continue him for now on cefepime but increase the dose to 2g every 8 hours and continue the IV vancomycin.  One could argue for simply repeating high dose rocephin plus gentamicin since in my mind I have very little doubt what organism we are dealing with here. This is not pseudomonas or MRSE or MRSA  I DO think we need repeat Echo at least 2d in next 2-3 weeks and repeat again at end of his therapy  HOPEFULLY his vegetation will shrink and we will be sufficiently re-assured to place him on high dose  amoxicilin which I would maintain for the rest of his life   Certainly if his vegetation worsens he we may indeed have to undergo repeat CT surgery and if this is done THIS time I would recommend removing his PM and placing temporary PM while we get him through yet another round of IV antibiotics   I spent greater than 40 minutes with the patient including greater than 50% of time in face to face counsel of the patient and in coordination of their care.We had discussion re the nature of GBS and his history.

## 2014-05-12 ENCOUNTER — Other Ambulatory Visit: Payer: Self-pay | Admitting: *Deleted

## 2014-05-12 DIAGNOSIS — I38 Endocarditis, valve unspecified: Secondary | ICD-10-CM

## 2014-05-17 ENCOUNTER — Encounter: Payer: Self-pay | Admitting: Cardiology

## 2014-05-17 ENCOUNTER — Ambulatory Visit (INDEPENDENT_AMBULATORY_CARE_PROVIDER_SITE_OTHER): Payer: BC Managed Care – PPO | Admitting: Cardiology

## 2014-05-17 VITALS — BP 132/80 | HR 96 | Ht 65.0 in | Wt 197.6 lb

## 2014-05-17 DIAGNOSIS — Z950 Presence of cardiac pacemaker: Secondary | ICD-10-CM

## 2014-05-17 DIAGNOSIS — Z9889 Other specified postprocedural states: Secondary | ICD-10-CM

## 2014-05-17 DIAGNOSIS — I330 Acute and subacute infective endocarditis: Secondary | ICD-10-CM

## 2014-05-17 DIAGNOSIS — Z8774 Personal history of (corrected) congenital malformations of heart and circulatory system: Secondary | ICD-10-CM

## 2014-05-17 NOTE — Assessment & Plan Note (Signed)
Follow-up cardiothoracic surgery.

## 2014-05-17 NOTE — Assessment & Plan Note (Signed)
Patient had an oscillating density associated with his aortic valve on most recent echocardiogram. Unclear whether this is vegetation or tissue. It was new compared to previous. He remains completely asymptomatic. He is not having fevers or chills. He was seen by Dr. Tyrone Sage during recent hospitalization and medical therapy felt warranted. Continue antibiotics and this is being followed by infectious disease. If he has recurrent positive blood cultures after completing IV antibiotics he may require redo surgery in the future. He could also require pacemaker removal. Hopefully this will not be necessary. Echocardiogram to be repeated in approximately 2 weeks.

## 2014-05-17 NOTE — Assessment & Plan Note (Signed)
Followed by electrophysiology. 

## 2014-05-17 NOTE — Patient Instructions (Signed)
Your physician recommends that you schedule a follow-up appointment in: 8 WEEKS WITH DR CRENSHAW  

## 2014-05-28 ENCOUNTER — Encounter: Payer: Self-pay | Admitting: Infectious Disease

## 2014-05-31 ENCOUNTER — Ambulatory Visit (HOSPITAL_COMMUNITY)
Admission: RE | Admit: 2014-05-31 | Discharge: 2014-05-31 | Disposition: A | Discharge status: Home / Self Care | Payer: BC Managed Care – PPO | Source: Ambulatory Visit | Attending: Cardiovascular Disease | Admitting: Cardiovascular Disease

## 2014-05-31 DIAGNOSIS — I38 Endocarditis, valve unspecified: Secondary | ICD-10-CM | POA: Diagnosis not present

## 2014-05-31 NOTE — Progress Notes (Signed)
2D Echocardiogram Complete.  05/31/2014   Magie Ciampa, RDCS  

## 2014-06-01 ENCOUNTER — Telehealth: Payer: Self-pay | Admitting: Licensed Clinical Social Worker

## 2014-06-01 NOTE — Telephone Encounter (Signed)
Patient had alert WBC at 2.2 from labs drawn from [facility], per Dr. Daiva Eves discontinue Vancomycin today. Continue Cefipime and redraw labs on Thursday 4/21

## 2014-06-04 ENCOUNTER — Telehealth: Payer: Self-pay | Admitting: Licensed Clinical Social Worker

## 2014-06-04 ENCOUNTER — Other Ambulatory Visit: Payer: Self-pay | Admitting: Licensed Clinical Social Worker

## 2014-06-04 MED ORDER — LEVOFLOXACIN 750 MG PO TABS: 750.0000 mg | ORAL_TABLET | Freq: Every day | ORAL | Status: DC

## 2014-06-04 NOTE — Telephone Encounter (Signed)
[place] is in [place], so medication can't be changed until Monday. Per Dr. Daiva Eves ok to stop Cefipime now and call in Levaquin 750 mg for the weekend. RN will come out Monday redraw CBC and administer new IV antibiotic. Patient aware, medication called in.

## 2014-06-04 NOTE — Telephone Encounter (Signed)
Critical low faxed from [facility].  WBC = 2.0 on 06/03/14.  Received 06/04/14, 8:55. Please advise.

## 2014-06-04 NOTE — Telephone Encounter (Signed)
Per Dr. Daiva Eves change IV antibiotics from Cefipime to Cubicin 6mg /kg can go to 8 mg/kg if possible. Per Amy pharmacy at [facility] she will start at 6 mg due to the cost of the drug for the patient.

## 2014-06-15 ENCOUNTER — Telehealth: Payer: Self-pay | Admitting: Licensed Clinical Social Worker

## 2014-06-15 ENCOUNTER — Ambulatory Visit (INDEPENDENT_AMBULATORY_CARE_PROVIDER_SITE_OTHER): Payer: BC Managed Care – PPO | Admitting: Infectious Disease

## 2014-06-15 ENCOUNTER — Encounter: Payer: Self-pay | Admitting: Infectious Disease

## 2014-06-15 VITALS — BP 115/79 | HR 97 | Temp 97.7°F | Ht 65.0 in | Wt 201.0 lb

## 2014-06-15 DIAGNOSIS — I442 Atrioventricular block, complete: Secondary | ICD-10-CM | POA: Diagnosis not present

## 2014-06-15 DIAGNOSIS — I330 Acute and subacute infective endocarditis: Secondary | ICD-10-CM | POA: Diagnosis not present

## 2014-06-15 DIAGNOSIS — A491 Streptococcal infection, unspecified site: Secondary | ICD-10-CM

## 2014-06-15 DIAGNOSIS — Z8774 Personal history of (corrected) congenital malformations of heart and circulatory system: Secondary | ICD-10-CM

## 2014-06-15 DIAGNOSIS — Z954 Presence of other heart-valve replacement: Secondary | ICD-10-CM

## 2014-06-15 DIAGNOSIS — I351 Nonrheumatic aortic (valve) insufficiency: Secondary | ICD-10-CM

## 2014-06-15 DIAGNOSIS — A401 Sepsis due to streptococcus, group B: Secondary | ICD-10-CM | POA: Diagnosis not present

## 2014-06-15 DIAGNOSIS — Z9889 Other specified postprocedural states: Secondary | ICD-10-CM

## 2014-06-15 DIAGNOSIS — I358 Other nonrheumatic aortic valve disorders: Secondary | ICD-10-CM

## 2014-06-15 DIAGNOSIS — Z950 Presence of cardiac pacemaker: Secondary | ICD-10-CM

## 2014-06-15 DIAGNOSIS — B951 Streptococcus, group B, as the cause of diseases classified elsewhere: Secondary | ICD-10-CM

## 2014-06-15 DIAGNOSIS — Z953 Presence of xenogenic heart valve: Secondary | ICD-10-CM

## 2014-06-15 DIAGNOSIS — D702 Other drug-induced agranulocytosis: Secondary | ICD-10-CM

## 2014-06-15 HISTORY — DX: Other drug-induced agranulocytosis: D70.2

## 2014-06-15 MED ORDER — LEVOFLOXACIN 750 MG PO TABS: 750.0000 mg | ORAL_TABLET | Freq: Every day | ORAL | Status: DC

## 2014-06-15 NOTE — Telephone Encounter (Signed)
We can pull the PICC line, does he still have Levaquin if so he should start that, If not let us send him in rx for levaquin 500mg  daily # 30 tablets with 11 refills and he needs followup with me

## 2014-06-15 NOTE — Progress Notes (Signed)
Subjective:    Patient ID: [name], male    DOB: 03-16-1985, 29 y.o.   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 Valve sent to [facility] showed GBS by ribosomal 16S sequencing.   He has had trouble tolerating the high dose ceftriaxone and also completed 2 weeks of gentamicin. He was on Rocephin when I saw him on 02/02/14 and getting used to the abx but a few days later was found to have worsening dyspnea, heart failure change in heart sounds and was admitted and found  To have new Ventricular Septal Defect (recurrent LV outflow tract to right atrial fistula, Severe Tricuspid Regurgitatio, Recurrent Patent Foramen Ovale, and Acute Congestive Heart Failure.   He underwent :   Redo Median Sternotomy  Repair of Ventricular Septal Defect Double layer bovine pericardial patch closure  Tricuspid Valve Repair Complex Valvuloplasty including plication of septal leaflet Edwards mc3 Ring annuloplasty  On January 1st, 2016. Tissue was sent off to [facility] where PCR of Ribosome AGAIN WAS POSITIVE FOR GROUP B STREPTOCOCCUS.      NOTE Pacemaker from first CT surgery had remained in place through 2nd CT surgery and he is Pacer dependent.   IN the interim he was treated withn Rocephin high dose along with 2 weeks of gentamicin (which he has finished).  His repeat Echo shows stability of his postoperative repairs.  Since I last saw him he was on oral amoxicillin and without any symptoms. His fiance was resting her head on his chest and discovered a new murmur.  He was evaluated and had 2d echo showing  mobile vegetation measuring 1.3 x  1.7 cm seemingly attached to the bioprosthetic aortic valve,with mild to moderate AI on 04/29/14.  He was brought into the hospital and underwent TEE which read as showing:  The same large (1.5 x 1.1cm) mobile irregular bordered echodensity along the anterior aspect of the subvalvular apparatus of the bioprosthetic aortic valve in the left ventricular outflow tract consistent with vegetation. There is perivalvular abscess like pocket/space in the posterior region of aortic valve adjacent to intraatrial septum. There was mild regurgitation.  He had blood cultures taken on amoxicillin which were negative and he was broadened ultimately to vancomycin and cefepime.   He was followed closely by Cardiology and CT surgery.   Dr. Tyrone Sage reviewed the TEE and noted that the  Lesion was in the outflow tract of the left ventricle and not on the aortic homograph leaflets. He did not recommend repeat 3rd open heart surgery.  Patient has been dc to home on IV vancomycin and cefepime.   We found that he then developed leukopenia, vancomycin was stopped, but it did not quickly resolved  So we stopped cefepime as well. He was out of town so we bridged him with high dose oral levaquin  daily until he could get back to [facility] and he was switched to IV daptomycin which he has been on through this his 7 week of his most recent round of abx therapy. TTE done in mid April shows that his vegetation has shrunk in size. He otherwise feels well though he is understandably anxious about persistent infection.  Review of Systems  Constitutional:  Negative for fever, chills, diaphoresis, activity change and appetite change.  HENT: Negative for congestion, rhinorrhea, sinus pressure, sneezing, sore throat and trouble swallowing.   Eyes: Negative for photophobia and visual disturbance.  Respiratory: Negative for cough, chest tightness, shortness of breath, wheezing and stridor.   Cardiovascular: Negative for  chest pain, palpitations and leg swelling.  Gastrointestinal: Negative for nausea, vomiting, diarrhea, constipation, blood in stool and abdominal distention.  Genitourinary: Negative for dysuria, hematuria, flank pain and difficulty urinating.  Musculoskeletal: Negative for myalgias, back pain, joint swelling, arthralgias and gait problem.  Skin: Negative for color change, pallor, rash and wound.  Neurological: Negative for dizziness, tremors, weakness and light-headedness.  Hematological: Negative for adenopathy. Does not bruise/bleed easily.  Psychiatric/Behavioral: Negative for behavioral problems, confusion, sleep disturbance, dysphoric mood, decreased concentration and agitation.       Objective:   Physical Exam  Constitutional: He is oriented to person, place, and time. He appears well-developed and well-nourished.  HENT:  Head: Normocephalic and atraumatic.  Eyes: Conjunctivae and EOM are normal.  Neck: Normal range of motion. Neck supple.  Cardiovascular: Regular rhythm.  Bradycardia present.   Murmur heard.  Systolic murmur is present with a grade of 3/6    Pulmonary/Chest: Effort normal. No respiratory distress. He has no wheezes.  Abdominal: Soft. He exhibits no distension.  Musculoskeletal: Normal range of motion. He exhibits no edema or tenderness.  Neurological: He is alert and oriented to person, place, and time.  Skin: Skin is warm and dry. No rash noted. No erythema. No pallor.  Psychiatric: He has a normal mood and affect. His behavior is normal. Judgment and thought content normal.   PM site clean today again  as it was on  02/02/14      Sternotomy clean :    PICC line clean      Assessment & Plan:   Group B streptococcal  Aortic valve endocarditis and Aorto-Right Atrial fistula sp AVR, TV repair, repair of Aorto to Right atrial fistula, placement of PM due to 3rd degree heart block, then Ventricular Septal Defect (recurrent LV outflow tract to right  atrial fistula, Severe Tricuspid Regurgitatio, Recurrent Patent Foramen Ovale, and Acute Congestive Heart Failure. Sp Redo Median Sternotom, Repair of Ventricular Septal Defect, Double layer bovine pericardial patch closure, Tricuspid Valve Repair, Complex Valvuloplasty including plication of septal leaflet, Edwards mc3 Ring annuloplasty with Group B Streptococcus isolated from heart valve sp repeat IV rocephin x 6 weeks with 2 weeks of gentamicin, changed to oral amoxicillin but now yet again with evidence of large vegetation + possible abscess vs cavity. Blood cultures are NG on amoxicillin and he was broadened IV vancomycin cefepime with course complicated by leukopenia that resolved when vancomycin and then cefepime stopped, bridged with levaquin then to daptomycin now into 7 weeks of therapy.  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  It is more a question of the organism persisting here and given that he has foreign material with PM I have high amount of anxiety that the GBS may be indeed on these PM wires in addition to his prosthetic heart valve   We will switch him to high dose oral levaquin  daily x 30 with refills. I intend to keep him on long term oral  antibiotics though I may want to rechallenge him with a beta lactam (ie amoxicillin) and see if he DOES NOT develop leukopenia   I do think getting a repeat Echo in future is good idea. He is anxious to have one done in another months   I spent greater than 25  minutes with the patient including greater than 50% of time in face to face counsel of the patient (re his infected heart valve, potential pm infection potential drug induced leukopenia) and in coordination of their care including removal of PICC  with [facility]. .   Drug induced leukopenia: not clear if it was due to vancomycin or cefepime. Would like to challenge with amoxicillin in the future

## 2014-06-15 NOTE — Telephone Encounter (Signed)
Patient finished antibiotics over the weekend need orders to pull PICC. Please advise

## 2014-06-16 ENCOUNTER — Telehealth: Payer: Self-pay | Admitting: *Deleted

## 2014-06-16 DIAGNOSIS — I38 Endocarditis, valve unspecified: Secondary | ICD-10-CM

## 2014-06-16 NOTE — Telephone Encounter (Signed)
Repeat echo 3 months    [name]            ----- Message -----     From: Randall Hiss, MD     Sent: 06/15/2014  4:23 PM      To: Delight Ovens, MD, Purcell Nails, MD, *        I am switching [name] to high dose oral levaquin for now and will pull his PICC. He is still very worried and is asking about repeat Echo a month after his last one. I will defer to you folks re when best to repeat but imaging not a bad idea to do 1-2 months from last one to reassure him    Spoke with pt, Follow up echo scheduled

## 2014-06-17 ENCOUNTER — Encounter: Payer: Self-pay | Admitting: Cardiothoracic Surgery

## 2014-06-17 ENCOUNTER — Ambulatory Visit (INDEPENDENT_AMBULATORY_CARE_PROVIDER_SITE_OTHER): Payer: BC Managed Care – PPO | Admitting: Cardiothoracic Surgery

## 2014-06-17 VITALS — BP 117/81 | HR 98 | Resp 16 | Ht 65.0 in | Wt 201.0 lb

## 2014-06-17 DIAGNOSIS — I5021 Acute systolic (congestive) heart failure: Secondary | ICD-10-CM

## 2014-06-17 DIAGNOSIS — Z9889 Other specified postprocedural states: Secondary | ICD-10-CM

## 2014-06-17 DIAGNOSIS — I351 Nonrheumatic aortic (valve) insufficiency: Secondary | ICD-10-CM | POA: Diagnosis not present

## 2014-06-17 DIAGNOSIS — I330 Acute and subacute infective endocarditis: Secondary | ICD-10-CM | POA: Diagnosis not present

## 2014-06-17 DIAGNOSIS — Z8774 Personal history of (corrected) congenital malformations of heart and circulatory system: Secondary | ICD-10-CM

## 2014-06-17 NOTE — Progress Notes (Signed)
[address]       [place] [address]             [phone]      [name] [facility] Medical Record #[id number] Date of Birth: 08/03/1985  Referring: [name], MD Primary Care: [name], MD  Chief Complaint:   POST OP FOLLOW UP Past Surgical History  Procedure Laterality Date  . Ascending aortic root replacement N/A 12/30/2013    Procedure: ASCENDING AORTIC ROOT REPLACEMENT with 5mm HOMOGRAFT, CLOSURE OF AORTIC TO RIGHT ATRIAL FISTULA, CLOSURE OF PATENT FORAMEN OVALE;  Surgeon: Delight Ovens, MD;  Location: [facility] OR;  Service: Open Heart Surgery;  Laterality: N/A;  . Tricuspid valve replacement N/A 12/30/2013    Procedure: REPAIR OF TRICUSPID VALVE LEAFLET;  Surgeon: Delight Ovens, MD;  Location: [facility] OR;  Service: Open Heart Surgery;  Laterality: N/A;  . Intraoperative transesophageal echocardiogram N/A 12/30/2013    Procedure: INTRAOPERATIVE TRANSESOPHAGEAL ECHOCARDIOGRAM;  Surgeon: Delight Ovens, MD;  Location: [facility] OR;  Service: Open Heart Surgery;  Laterality: N/A;  . Permanent pacemaker insertion N/A 01/04/2014    Procedure: PERMANENT PACEMAKER INSERTION;  Surgeon: Marinus Maw, MD;  Location: [facility] CATH LAB;  Service: Cardiovascular;  Laterality: N/A;  . Insert / replace / remove pacemaker  01/04/2014  . Right heart catheterization N/A 02/12/2014    Procedure: RIGHT HEART CATH;  Surgeon: Laurey Morale, MD;  Location: [facility] CATH LAB;  Service: Cardiovascular;  Laterality: N/A;  . Tee without cardioversion N/A 02/12/2014    Procedure: TRANSESOPHAGEAL ECHOCARDIOGRAM (TEE);  Surgeon: Quintella Reichert, MD;  Location: [facility] ENDOSCOPY;  Service: Cardiovascular;  Laterality: N/A;  . Tricuspid valve replacement N/A 02/12/2014    Procedure: TRICUSPID VALVE REPAIR, REDO MEDIAN STERNOTOMY, REPAIR OF VENTRICULAR SEPTAL DEFECT (RECURRENT LV OUTFLOW TRACT TO RIGHT ATRIAL FISTULA), REDO CLOSURE OF PATENT FORAMEN OVALE;  Surgeon: Purcell Nails, MD;   Location: [facility] OR;  Service: Open Heart Surgery;  Laterality: N/A;  . 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 He recovered remarkably quickly, although he required permanent pacemaker placement for complete heart block on 01/04/2014. He was treated with a 6 week course of intravenous high-dose Rocephin and made excellent progress over the next several weeks. 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 He  underwent emergency redo median sternotomy for ventricular septal defect repair, redo tricuspid valve repair, and redo closure of patent foramen ovale on 02/12/2014. His subsequent recovery after the second operation was uneventful, and he was again discharged from the hospital on home IV antibiotics. He completed 2 weeks of intravenous gentamicin and he remains on high-dose Rocephin at this time. A follow-up echocardiogram was obtained 03/09/2014 that revealed intact repair of his  fistula. He was seen in follow-up by Dr. Daiva Eves at the infectious disease clinic 03/10/2014. Although intraoperative cultures obtained at the time of the patient's second operation on  02/12/2014 did not grow any bacteria, tissue was sent off to the [facility] in [place] where PCR analysis was again positive for group B Streptococcus. With these findings in mind Dr. Daiva Eves plans for the patient to complete a second 6 week course of high dose intravenous Rocephin, after which time he plans begin oral amoxicillin for "a few months".  In March 2016 a repeat ECHO suggested: - Aortic valve: There is a large (1.5 x 1.1cm) mobile irregular bordered echodensity along the anterior aspect of the subvalvular apparatus of the bioprosthetic aortic valve in the left ventricular outflow tract consistent with vegetation. Although the valve leaflets do not seem to be compromised, there is reverberation artifact from bioprosthetic ring, and vegetation may be attached to base of the leaflet. There is perivalvular abscess like pocket/space in the posterior region of aortic valve adjacent to intraatrial septum. There was mild regurgitation. - Mitral valve: No evidence of vegetation. There was mild regurgitation.  Repeat echo done last week  Showed marked improvement: There was a small, mobile vegetation.   Since d/c in March  he states that he feels very well, much better than he has in a long time. He has no shortness of breath. He is walking a fair amount every day without any exertional shortness of breath. He feels as though he can do much more, and he hopes to begin exercising more strenuously. He denies any fevers or chills. He has not had any palpitations or dizzy spells. He is sleeping well at night. His appetite is good. Previous symptoms of low back pain have resolved.       Past Medical History  Diagnosis Date  . Obesity   . Smokeless tobacco use   . Fracture of left femur   . Meningitis     Group B Strep (November 2015)  . Patent foramen ovale     Closed during procedure on December 30, 2013  . History of open heart surgery      December 30, 2013- ascending aortic root replacement, TEE  . Endocarditis     related to meningitis   . Brain abscess   . Presence of permanent cardiac pacemaker 01/04/2014    PPM MEDTRONIC FOR COMPLETE HEART BLOCK  . Thrombocytopenia 12/2013  . AKI (acute kidney injury)   . 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 Tricuspid regurgitation 02/11/2014  . 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 S/P ventricular septal defect repair + redo tricuspid valve repair + redo closure PFO 02/12/2014    Redo sternotomy for bovine pericardial patch repair of large ventricular septal defect (recurrent LVOT to RA fistula due to bacterial endocarditis)  . S/P redo tricuspid valve repair 02/12/2014    Complex valvuloplasty including patch closure of VSD with plication of septal leaflet and 26 mm Edwards mc3 ring annuloplasty  . S/P redo patent foramen ovale closure 02/12/2014  . S/P placement of cardiac pacemaker 01/04/2014    [employer] (serial number [id number]) dual chamber permanent pacemaker implanted by Dr Ladona Ridgel  . moderate residual aortic insufficiency s/p allograft aortic root replacement 03/08/2014  . Drug-induced leukopenia 06/15/2014  History  Smoking status  . Never Smoker   Smokeless tobacco  . Former Neurosurgeon  . Quit date: 12/30/2013    History  Alcohol Use  . 0.0 oz/week  . 0 Standard drinks or equivalent per week     Allergies  Allergen Reactions  . Cefepime Other (See Comments)    Leukopenia not clear if due to vancomycin vs cefepime  . Vancomycin Other (See Comments)    ? Drug induced leukopenia vs being due to cefepime    Current Outpatient Prescriptions  Medication Sig Dispense Refill  . aspirin EC 325 MG EC tablet Take 1 tablet (325 mg total) by mouth daily. 30 tablet 0  .  levofloxacin (LEVAQUIN) 750 MG tablet Take 1 tablet (750 mg total) by mouth daily. 30 tablet 5   No current facility-administered medications for this visit.       Physical Exam: BP 117/81 mmHg  Pulse 98  Resp 16  Ht  (1.651 m)  Wt 201 lb (91.173 kg)  BMI 33.45 kg/m2  SpO2 98%  General appearance: alert and cooperative Neurologic: intact Heart: Since last seen the character of  his murmur has significantly improved , now there is an early systolic ejection murmur I do not appreciate any murmur of aortic insufficiency  Lungs: clear to auscultation bilaterally, do not appreciate any pleural rub Abdomen: soft, non-tender; bowel sounds normal; no masses,  no organomegaly Extremities: extremities normal, atraumatic, no cyanosis or edema and Homans sign is negative, no sign of DVT Wound: Sternum is stable and well-healed He has no flank tenderness on either side He has no pedal edema  Diagnostic Studies & Laboratory data:       Echo: 05/2014: Study discussed with Dr Jens Som, marked improvement compared to previous echo/TEE 04/30/2014 done during last admission Study Conclusions  - Left ventricle: The cavity size was normal. Wall thickness was normal. Systolic function was normal. The estimated ejection fraction was in the range of 50% to 55%. Doppler parameters are consistent with a reversible restrictive pattern, indicative of decreased left ventricular diastolic compliance and/or increased left atrial pressure (grade 3 diastolic dysfunction). - Ventricular septum: Septal motion showed abnormal function and dyssynergy. - Aortic valve: A bioprosthesis was present. There was a small, mobile vegetation. Valve area (VTI): 1.43 cm^2. Valve area (Vmax): 1.23 cm^2. Valve area (Vmean): 1.3 cm^2. - Mitral valve: There was mild regurgitation. - Left atrium: The atrium was mildly dilated    03/09/2014 Study Conclusions  - Left ventricle: There was mild  concentric hypertrophy. Systolic function was mildly reduced. The estimated ejection fraction was in the range of 45% to 50%. Features are consistent with a pseudonormal left ventricular filling pattern, with concomitant abnormal relaxation and increased filling pressure (grade 2 diastolic dysfunction). - Ventricular septum: Septal motion showed paradox. - Aortic valve: A bioprosthetic valve sits well in the aortic position. There is no paravalvular leak and moderate central regurgitation. Transaortic gradients are higher when compared to the prior study and are at the upper normal border. Mean gradient 18 mmHg. There was moderate regurgitation. Valve area (VTI): 1.4 cm^2. Valve area (Vmax): 1.09 cm^2. Valve area (Vmean): 1.19 cm^2. - Aorta: Aortic root dimension: 33 mm (ED). - Mitral valve: There was mild regurgitation. - Left atrium: The atrium was mildly dilated. - Right ventricle: Systolic function was mildly reduced. - Tricuspid valve: S/P tricuspid valve repair. Normal tricuspid gradients. - Pulmonary arteries: Systolic pressure was within the normal range. - Inferior vena cava: The vessel was normal  in size. The respirophasic diameter changes were in the normal range (= 50%), consistent with normal central venous pressure. - Pericardium, extracardiac: A trivial pericardial effusion was identified.  Impressions:  - Compared to the prior study from 02/11/2014, the left ventricular ejection fraction is mildly impaired estimated at 45-50%. A bioprosthetic valve sits well in the aortic position. There is no paravalvular leak and moderate central regurgitation. Transaortic gradients are higher when compared to the prior study and are at the upper normal border. Mean gradient 18 mmHg. A patch is seen in the membranous portion of the interventricular septum. No flow is seen. Mild left atrial dilatation. S/P tricuspid valve repair  with only minimal tricuspid regurgitation and normal RVSP (previously severe TR and severe pulmonary hypertension).  Recent Lab Findings: Lab Results  Component Value Date   WBC 5.2 05/03/2014   HGB 11.2* 05/03/2014   HCT 33.5* 05/03/2014   PLT 178 05/03/2014   GLUCOSE 186* 05/03/2014   TRIG 119 12/29/2013   ALT 21 04/29/2014   AST 18 04/29/2014   NA 137 05/03/2014   K 3.9 05/03/2014   CL 103 05/03/2014   CREATININE 0.97 05/03/2014   BUN 19 05/03/2014   CO2 29 05/03/2014   TSH 2.109 05/03/2014   INR 1.40 02/13/2014      Assessment / Plan:   Patient doing well following second surgery for complex endocarditis involving the aortic valve aortic root and tricuspid valve, currently the surgical repair appears stable. With current therpy the vegation noted in march is improved. He now continues on levaquin po Cardiology to do follow up echo  Arranged. I will see in 3 months     Delight Ovens MD      [address] [place] [place] [address] Office [phone]   Beeper [phone]  06/17/2014 11:44 AM

## 2014-07-01 ENCOUNTER — Ambulatory Visit: Payer: BC Managed Care – PPO | Admitting: Cardiothoracic Surgery

## 2014-07-02 ENCOUNTER — Encounter: Payer: Self-pay | Admitting: Infectious Disease

## 2014-07-05 ENCOUNTER — Encounter: Payer: Self-pay | Admitting: Infectious Disease

## 2014-07-05 NOTE — Progress Notes (Signed)
HPI: FU AVR; initially seen in Nov 2015 with "flu" that ended up being bacterial endocarditis. He underwent aortic root and valve replacement with fistula repair in Nov. At that time he also had CHB and had a MDT PTVDP placed. Jan 2015 he had symptoms of dyspnea and was re evaluated. A TEE was done and and revealed a large VSD with left to right shunting from LVOT. On 02/12/2014 he underwent Redo Median Sternotomy, Repair of a VSD, Tricuspid Valve Repair, and Redo closure of PFO.Patient had a repeat echocardiogram on 04/29/2014 that showed mild global reduction in LV function. There was a mobile mass on the aortic valve concerning for vegetation as well as mild to moderate aortic insufficiency. The patient was admitted and IV antibiotics resumed. Blood cultures remain negative. Transesophageal echocardiogram revealed low normal LV function and an aortic valve mass. 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 Patient was evaluated by Dr. Tyrone Sage and medical therapy recommended. Follow-up echocardiogram April 2016 showed ejection fraction 50-55%, grade 3 diastolic dysfunction, bioprosthetic aortic valve with small mobile density (smaller compared to previous), mild mitral regurgitation and mild left atrial enlargement. Since last seen, the patient denies any dyspnea on exertion, orthopnea, PND, pedal edema, palpitations, syncope. No fevers, chills. Occasional brief pain over pacemaker site for 1-2 seconds. Occasional pain at sternotomy.  Current Outpatient Prescriptions  Medication Sig Dispense Refill  . aspirin EC 325 MG EC tablet Take 1 tablet (325 mg total) by mouth daily. 30 tablet 0  . levofloxacin (LEVAQUIN) 750 MG tablet Take 1 tablet (750 mg total) by mouth daily. 30 tablet 5   No current facility-administered medications for this visit.     Past Medical History  Diagnosis Date  . Obesity   . Smokeless tobacco use   . Fracture of left femur   .  Meningitis     Group B Strep (November 2015)  . Patent foramen ovale     Closed during procedure on December 30, 2013  . History of open heart surgery     December 30, 2013- ascending aortic root replacement, TEE  . Endocarditis     related to meningitis   . Brain abscess   . Presence of permanent cardiac pacemaker 01/04/2014    PPM MEDTRONIC FOR COMPLETE HEART BLOCK  . Thrombocytopenia 12/2013  . AKI (acute kidney injury)   . 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 Tricuspid regurgitation 02/11/2014  . 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 S/P ventricular septal defect repair + redo tricuspid valve repair + redo closure PFO 02/12/2014    Redo sternotomy for bovine pericardial patch repair of large ventricular septal defect (recurrent LVOT to RA fistula due to bacterial endocarditis)  . S/P redo tricuspid valve repair 02/12/2014    Complex valvuloplasty including patch closure of VSD with plication of septal leaflet and 26 mm Edwards mc3 ring annuloplasty  . S/P redo patent foramen ovale closure 02/12/2014  . S/P placement of cardiac pacemaker 01/04/2014    Medtronic (serial number [id number]) dual chamber permanent pacemaker implanted by Dr Ladona Ridgel  . moderate residual aortic insufficiency s/p allograft aortic root replacement 03/08/2014  . Drug-induced leukopenia 06/15/2014    Past Surgical History  Procedure Laterality Date  . Ascending aortic root replacement N/A 12/30/2013  Procedure: ASCENDING AORTIC ROOT REPLACEMENT with 70mm HOMOGRAFT, CLOSURE OF AORTIC TO RIGHT ATRIAL FISTULA, CLOSURE OF PATENT FORAMEN OVALE;  Surgeon: Delight Ovens, MD;  Location: [facility] OR;  Service: Open Heart Surgery;  Laterality: N/A;  . Tricuspid valve replacement N/A 12/30/2013    Procedure: REPAIR OF TRICUSPID  VALVE LEAFLET;  Surgeon: Delight Ovens, MD;  Location: [facility] OR;  Service: Open Heart Surgery;  Laterality: N/A;  . Intraoperative transesophageal echocardiogram N/A 12/30/2013    Procedure: INTRAOPERATIVE TRANSESOPHAGEAL ECHOCARDIOGRAM;  Surgeon: Delight Ovens, MD;  Location: [facility] OR;  Service: Open Heart Surgery;  Laterality: N/A;  . Permanent pacemaker insertion N/A 01/04/2014    Procedure: PERMANENT PACEMAKER INSERTION;  Surgeon: Marinus Maw, MD;  Location: [facility] CATH LAB;  Service: Cardiovascular;  Laterality: N/A;  . Insert / replace / remove pacemaker  01/04/2014  . Right heart catheterization N/A 02/12/2014    Procedure: RIGHT HEART CATH;  Surgeon: Laurey Morale, MD;  Location: [facility] CATH LAB;  Service: Cardiovascular;  Laterality: N/A;  . Tee without cardioversion N/A 02/12/2014    Procedure: TRANSESOPHAGEAL ECHOCARDIOGRAM (TEE);  Surgeon: Quintella Reichert, MD;  Location: [facility] ENDOSCOPY;  Service: Cardiovascular;  Laterality: N/A;  . Tricuspid valve replacement N/A 02/12/2014    Procedure: TRICUSPID VALVE REPAIR, REDO MEDIAN STERNOTOMY, REPAIR OF VENTRICULAR SEPTAL DEFECT (RECURRENT LV OUTFLOW TRACT TO RIGHT ATRIAL FISTULA), REDO CLOSURE OF PATENT FORAMEN OVALE;  Surgeon: Purcell Nails, MD;  Location: [facility] OR;  Service: Open Heart Surgery;  Laterality: N/A;  . Tee without cardioversion N/A 04/30/2014    Procedure: TRANSESOPHAGEAL ECHOCARDIOGRAM (TEE);  Surgeon: Jake Bathe, MD;  Location: [facility] ENDOSCOPY;  Service: Cardiovascular;  Laterality: N/A;    History   Social History  . Marital Status: Single    Spouse Name: N/A  . Number of Children: N/A  . Years of Education: N/A   Occupational History  . Not on file.   Social History Main Topics  . Smoking status: Never Smoker   . Smokeless tobacco: Former Neurosurgeon    Quit date: 12/30/2013  . Alcohol Use: 0.0 oz/week    0 Standard drinks or equivalent per week  . Drug Use: No  . Sexual Activity: Yes    Birth Control/ Protection: Condom    Other Topics Concern  . Not on file   Social History Narrative    ROS: no fevers or chills, productive cough, hemoptysis, dysphasia, odynophagia, melena, hematochezia, dysuria, hematuria, rash, seizure activity, orthopnea, PND, pedal edema, claudication. Remaining systems are negative.  Physical Exam: Well-developed well-nourished in no acute distress.  Skin is warm and dry.  HEENT is normal.  Neck is supple.  Chest is clear to auscultation with normal expansion. Pacemaker left upper chest with no hematoma and no erythema. Sternotomy appears to be stable. Cardiovascular exam is regular rate and rhythm. 2/6 systolic murmur left sternal border. No diastolic murmur. Abdominal exam nontender or distended. No masses palpated. Extremities show no edema. neuro grossly intact

## 2014-07-06 ENCOUNTER — Ambulatory Visit (INDEPENDENT_AMBULATORY_CARE_PROVIDER_SITE_OTHER): Payer: BC Managed Care – PPO | Admitting: *Deleted

## 2014-07-06 ENCOUNTER — Encounter: Payer: Self-pay | Admitting: Internal Medicine

## 2014-07-06 DIAGNOSIS — I442 Atrioventricular block, complete: Secondary | ICD-10-CM | POA: Diagnosis not present

## 2014-07-06 NOTE — Progress Notes (Signed)
Remote pacemaker transmission.   

## 2014-07-07 LAB — CUP PACEART REMOTE DEVICE CHECK
Battery Impedance: 100 Ohm
Brady Statistic AP VP Percent: 1 %
Brady Statistic AP VS Percent: 0 %
Date Time Interrogation Session: 20160524112529
Lead Channel Impedance Value: 405 Ohm
Lead Channel Pacing Threshold Amplitude: 0.625 V
Lead Channel Sensing Intrinsic Amplitude: 1.4 mV
Lead Channel Setting Pacing Amplitude: 2.5 V
Lead Channel Setting Pacing Pulse Width: 0.4 ms
Lead Channel Setting Sensing Sensitivity: 4 mV
MDC IDC MSMT BATTERY REMAINING LONGEVITY: 124 mo
MDC IDC MSMT BATTERY VOLTAGE: 2.79 V
MDC IDC MSMT LEADCHNL RA IMPEDANCE VALUE: 423 Ohm
MDC IDC MSMT LEADCHNL RA PACING THRESHOLD PULSEWIDTH: 0.4 ms
MDC IDC MSMT LEADCHNL RV PACING THRESHOLD AMPLITUDE: 0.625 V
MDC IDC MSMT LEADCHNL RV PACING THRESHOLD PULSEWIDTH: 0.4 ms
MDC IDC SET LEADCHNL RA PACING AMPLITUDE: 1.5 V
MDC IDC STAT BRADY AS VP PERCENT: 99 %
MDC IDC STAT BRADY AS VS PERCENT: 0 %

## 2014-07-13 ENCOUNTER — Ambulatory Visit (INDEPENDENT_AMBULATORY_CARE_PROVIDER_SITE_OTHER): Payer: BC Managed Care – PPO | Admitting: Cardiology

## 2014-07-13 ENCOUNTER — Encounter: Payer: Self-pay | Admitting: Cardiology

## 2014-07-13 VITALS — BP 98/76 | HR 80 | Ht 65.0 in | Wt 206.0 lb

## 2014-07-13 DIAGNOSIS — I330 Acute and subacute infective endocarditis: Secondary | ICD-10-CM | POA: Diagnosis not present

## 2014-07-13 DIAGNOSIS — Z950 Presence of cardiac pacemaker: Secondary | ICD-10-CM | POA: Diagnosis not present

## 2014-07-13 NOTE — Assessment & Plan Note (Signed)
Most recent echocardiogram shows reduced size of mobile density on aortic valve. If he develops recurrent positive blood cultures he could potentially require additional surgery in the future and possible removal of pacemaker. Patient continues on long-term oral anti-biotics. He is being followed by infectious disease. No recent fevers or chills. Plan repeat echocardiogram July 2016. He is being followed by Dr Lowella Fairy as well.  He appears to be doing well with conservative measures at this point.

## 2014-07-13 NOTE — Assessment & Plan Note (Signed)
Followed by electrophysiology. 

## 2014-07-13 NOTE — Patient Instructions (Signed)
Your physician recommends that you schedule a follow-up appointment in: 8 WEEKS WITH DR CRENSHAW  

## 2014-07-14 ENCOUNTER — Encounter: Payer: Self-pay | Admitting: Cardiology

## 2014-07-21 ENCOUNTER — Encounter: Payer: Self-pay | Admitting: Infectious Disease

## 2014-07-21 ENCOUNTER — Ambulatory Visit (INDEPENDENT_AMBULATORY_CARE_PROVIDER_SITE_OTHER): Payer: BC Managed Care – PPO | Admitting: Infectious Disease

## 2014-07-21 VITALS — BP 118/82 | HR 72 | Wt 206.0 lb

## 2014-07-21 DIAGNOSIS — I330 Acute and subacute infective endocarditis: Secondary | ICD-10-CM | POA: Diagnosis not present

## 2014-07-21 DIAGNOSIS — Z950 Presence of cardiac pacemaker: Secondary | ICD-10-CM

## 2014-07-21 DIAGNOSIS — B951 Streptococcus, group B, as the cause of diseases classified elsewhere: Secondary | ICD-10-CM | POA: Diagnosis not present

## 2014-07-21 DIAGNOSIS — A401 Sepsis due to streptococcus, group B: Secondary | ICD-10-CM | POA: Diagnosis not present

## 2014-07-21 DIAGNOSIS — Z953 Presence of xenogenic heart valve: Secondary | ICD-10-CM

## 2014-07-21 DIAGNOSIS — A491 Streptococcal infection, unspecified site: Secondary | ICD-10-CM

## 2014-07-21 DIAGNOSIS — Z954 Presence of other heart-valve replacement: Secondary | ICD-10-CM

## 2014-07-21 DIAGNOSIS — Z8774 Personal history of (corrected) congenital malformations of heart and circulatory system: Secondary | ICD-10-CM

## 2014-07-21 DIAGNOSIS — I442 Atrioventricular block, complete: Secondary | ICD-10-CM | POA: Diagnosis not present

## 2014-07-21 DIAGNOSIS — Z9889 Other specified postprocedural states: Secondary | ICD-10-CM

## 2014-07-21 DIAGNOSIS — I358 Other nonrheumatic aortic valve disorders: Secondary | ICD-10-CM

## 2014-07-21 DIAGNOSIS — D702 Other drug-induced agranulocytosis: Secondary | ICD-10-CM

## 2014-07-21 NOTE — Progress Notes (Signed)
Subjective:    Patient ID: [name], male    DOB: 11-17-1985, 30 y.o.   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 Valve sent to [facility] showed GBS by ribosomal 16S sequencing.   He has had trouble tolerating the high dose ceftriaxone and also completed 2 weeks of gentamicin. He was on Rocephin when I saw him on 02/02/14 and getting used to the abx but a few days later was found to have worsening dyspnea, heart failure change in heart sounds and was admitted and found  To have new Ventricular Septal Defect (recurrent LV outflow tract to right atrial fistula, Severe Tricuspid Regurgitatio, Recurrent Patent Foramen Ovale, and Acute Congestive Heart Failure.   He underwent :   Redo Median Sternotomy  Repair of Ventricular Septal Defect Double layer bovine pericardial patch closure  Tricuspid Valve Repair Complex Valvuloplasty including plication of septal leaflet Edwards mc3 Ring annuloplasty  On January 1st, 2016. Tissue was sent off to [facility] where PCR of Ribosome AGAIN WAS POSITIVE FOR GROUP B STREPTOCOCCUS.      NOTE Pacemaker from first CT surgery had remained in place through 2nd CT surgery and he is Pacer dependent.   IN the interim he was treated withn Rocephin high dose along with 2 weeks of gentamicin (which he has finished).  His repeat Echo shows stability of his postoperative repairs.  Since I last saw him he was on oral amoxicillin and without any symptoms. His fiance was resting her head on his chest and discovered a new murmur.  He was evaluated and had 2d echo showing  mobile vegetation measuring 1.3 x  1.7 cm seemingly attached to the bioprosthetic aortic valve,with mild to moderate AI on 04/29/14.  He was brought into the hospital and underwent TEE which read as showing:  The same large (1.5 x 1.1cm) mobile irregular bordered echodensity along the anterior aspect of the subvalvular apparatus of the bioprosthetic aortic valve in the left ventricular outflow tract consistent with vegetation. There is perivalvular abscess like pocket/space in the posterior region of aortic valve adjacent to intraatrial septum. There was mild regurgitation.  He had blood cultures taken on amoxicillin which were negative and he was broadened ultimately to vancomycin and cefepime.   He was followed closely by Cardiology and CT surgery.   Dr. Tyrone Sage reviewed the TEE and noted that the  Lesion was in the outflow tract of the left ventricle and not on the aortic homograph leaflets. He did not recommend repeat 3rd open heart surgery.  Patient has been dc to home on IV vancomycin and cefepime.   We found that he then developed leukopenia, vancomycin was stopped, but it did not quickly resolved  So we stopped cefepime as well. He was out of town so we bridged him with high dose oral levaquin  daily until he could get back to [facility] and he was switched to IV daptomycin which he has been on through this his 7 week of his most recent round of abx therapy. TTE done in mid April shows that his vegetation has shrunk in size.  He has been seen by Dr. Tyrone Sage and Dr. Jens Som since seeing me. He is doing very  well and without any symptoms to suggest infection.   .Review of Systems  Constitutional: Negative for fever, chills, diaphoresis, activity change and appetite change.  HENT: Negative for congestion, rhinorrhea, sinus pressure, sneezing, sore throat and trouble swallowing.   Eyes: Negative for photophobia and visual disturbance.  Respiratory: Negative for cough, chest tightness, shortness of breath,  wheezing and stridor.   Cardiovascular: Negative for chest pain, palpitations and leg swelling.  Gastrointestinal: Negative for nausea, vomiting, diarrhea, constipation, blood in stool and abdominal distention.  Genitourinary: Negative for dysuria, hematuria, flank pain and difficulty urinating.  Musculoskeletal: Negative for myalgias, back pain, joint swelling, arthralgias and gait problem.  Skin: Negative for color change, pallor, rash and wound.  Neurological: Negative for dizziness, tremors, weakness and light-headedness.  Hematological: Negative for adenopathy. Does not bruise/bleed easily.  Psychiatric/Behavioral: Negative for behavioral problems, confusion, sleep disturbance, dysphoric mood, decreased concentration and agitation.       Objective:   Physical Exam  Constitutional: He is oriented to person, place, and time. He appears well-developed and well-nourished.  HENT:  Head: Normocephalic and atraumatic.  Eyes: Conjunctivae and EOM are normal.  Neck: Normal range of motion. Neck supple.  Cardiovascular: Regular rhythm.  Bradycardia present.   Murmur heard.  Systolic murmur is present with a grade of 3/6    Pulmonary/Chest: Effort normal and breath sounds normal. No respiratory distress. He has no wheezes. He has no rales. He exhibits no tenderness.  Abdominal: Soft. Bowel sounds are normal. He exhibits no distension. There is no tenderness. There is no rebound.  Musculoskeletal: Normal range of motion. He exhibits no edema or tenderness.  Neurological: He is alert and oriented to person, place, and time.  Skin: Skin is warm and dry. No rash noted. No erythema. No pallor.  Psychiatric: He has a normal mood and affect. His behavior is normal. Judgment and thought content normal.   PM, Sternotomy clean 07/21/14:             Assessment & Plan:   Group B streptococcal  Aortic valve endocarditis and Aorto-Right Atrial fistula sp AVR, TV repair, repair of Aorto to Right  atrial fistula, placement of PM due to 3rd degree heart block, then Ventricular Septal Defect (recurrent LV outflow tract to right atrial fistula, Severe Tricuspid Regurgitatio, Recurrent Patent Foramen Ovale, and Acute Congestive Heart Failure. Sp Redo Median Sternotom, Repair of Ventricular Septal Defect, Double layer bovine pericardial patch closure, Tricuspid Valve Repair, Complex Valvuloplasty including plication of septal leaflet, Edwards mc3 Ring annuloplasty with Group B Streptococcus isolated from heart valve sp repeat IV rocephin x 6 weeks with 2 weeks of gentamicin, changed to oral amoxicillin but now yet again with evidence of large vegetation + possible abscess vs cavity. Blood cultures are NG on amoxicillin and he was broadened IV vancomycin cefepime with course complicated by leukopenia that resolved when vancomycin and then cefepime stopped, bridged with levaquin then to daptomycin now into 7 weeks of therapy.  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  It is more a question of the organism persisting here and given that he has foreign material with PM I have high amount of anxiety that the GBS may be indeed on these PM wires in addition to his prosthetic heart valve  Continue oral levaquin  daily indefinitely.    I intend to keep him on long term oral antibiotics though I may want to rechallenge him with a beta lactam (ie amoxicillin) and see if he DOES NOT develop leukopenia   I agree with  getting a repeat Echo in future in a few months  I spent greater than 25  minutes with the patient including greater than 50% of time in face to face counsel of the patient (re his infected heart valve, potential pm infection potential drug induced leukopenia)  and in coordination of their care including removal of PICC with [facility]. .   Drug induced leukopenia: not clear if it was due to vancomycin or cefepime. Would like to challenge with amoxicillin in the future

## 2014-07-28 ENCOUNTER — Encounter: Payer: Self-pay | Admitting: Cardiology

## 2014-08-20 ENCOUNTER — Ambulatory Visit (HOSPITAL_COMMUNITY)
Admission: RE | Admit: 2014-08-20 | Discharge: 2014-08-20 | Disposition: A | Discharge status: Home / Self Care | Payer: BC Managed Care – PPO | Source: Ambulatory Visit | Attending: Cardiology | Admitting: Cardiology

## 2014-08-20 DIAGNOSIS — I38 Endocarditis, valve unspecified: Secondary | ICD-10-CM | POA: Diagnosis not present

## 2014-08-20 DIAGNOSIS — I351 Nonrheumatic aortic (valve) insufficiency: Secondary | ICD-10-CM | POA: Insufficient documentation

## 2014-08-20 DIAGNOSIS — Z954 Presence of other heart-valve replacement: Secondary | ICD-10-CM | POA: Insufficient documentation

## 2014-08-25 ENCOUNTER — Encounter: Payer: Self-pay | Admitting: Infectious Disease

## 2014-08-26 ENCOUNTER — Telehealth: Payer: Self-pay | Admitting: Cardiology

## 2014-08-26 ENCOUNTER — Encounter: Payer: Self-pay | Admitting: Infectious Disease

## 2014-08-26 NOTE — Telephone Encounter (Signed)
Information given to patient. No vegetation noted. Verbalized understanding.

## 2014-08-26 NOTE — Telephone Encounter (Signed)
Patient would like his Echo results from last week.

## 2014-09-02 ENCOUNTER — Ambulatory Visit: Payer: BC Managed Care – PPO | Admitting: Cardiothoracic Surgery

## 2014-09-14 NOTE — Progress Notes (Signed)
HPI: FU AVR; initially seen in Nov 2015 with "flu" that ended up being bacterial endocarditis. He underwent aortic root and valve replacement with fistula repair. At that time he also had CHB and had a MDT PTVDP placed. Jan 2015 he had symptoms of dyspnea and was re evaluated. A TEE was done and and revealed a large VSD with left to right shunting from LVOT. On 02/12/2014 he underwent Redo Median Sternotomy, Repair of a VSD, Tricuspid Valve Repair, and Redo closure of PFO.Patient had a repeat echocardiogram on 04/29/2014 that showed mild global reduction in LV function. There was a mobile mass on the aortic valve concerning for vegetation as well as mild to moderate aortic insufficiency. The patient was admitted and IV antibiotics resumed. Blood cultures remain negative. Transesophageal echocardiogram revealed low normal LV function and an aortic valve mass. 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 Patient was evaluated by Dr. Tyrone Sage and medical therapy recommended. Last echo 7/16 showed normal LV function, bioprosthetic aortic valve with mild AI; mild RAE; no vegetation. Since last seen, the patient denies any dyspnea on exertion, orthopnea, PND, pedal edema, palpitations, syncope or chest pain.   Current Outpatient Prescriptions  Medication Sig Dispense Refill  . aspirin EC 325 MG EC tablet Take 1 tablet (325 mg total) by mouth daily. 30 tablet 0  . levofloxacin (LEVAQUIN) 750 MG tablet Take 1 tablet (750 mg total) by mouth daily. 30 tablet 5   No current facility-administered medications for this visit.     Past Medical History  Diagnosis Date  . Obesity   . Smokeless tobacco use   . Fracture of left femur   . Meningitis     Group B Strep (November 2015)  . Patent foramen ovale     Closed during procedure on December 30, 2013  . History of open heart surgery     December 30, 2013- ascending aortic root replacement, TEE  . Endocarditis    related to meningitis   . Brain abscess   . Presence of permanent cardiac pacemaker 01/04/2014    PPM MEDTRONIC FOR COMPLETE HEART BLOCK  . Thrombocytopenia 12/2013  . AKI (acute kidney injury)   . 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 Tricuspid regurgitation 02/11/2014  . 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 S/P ventricular septal defect repair + redo tricuspid valve repair + redo closure PFO 02/12/2014    Redo sternotomy for bovine pericardial patch repair of large ventricular septal defect (recurrent LVOT to RA fistula due to bacterial endocarditis)  . S/P redo tricuspid valve repair 02/12/2014    Complex valvuloplasty including patch closure of VSD with plication of septal leaflet and 26 mm Edwards mc3 ring annuloplasty  . S/P redo patent foramen ovale closure 02/12/2014  . S/P placement of cardiac pacemaker 01/04/2014    Medtronic (serial number [id number]) dual chamber permanent pacemaker implanted by Dr Ladona Ridgel  . moderate residual aortic insufficiency s/p allograft aortic root replacement 03/08/2014  . Drug-induced leukopenia 06/15/2014    Past Surgical History  Procedure Laterality Date  . Ascending aortic root replacement N/A 12/30/2013    Procedure: ASCENDING AORTIC ROOT REPLACEMENT with 52mm HOMOGRAFT, CLOSURE OF AORTIC TO RIGHT ATRIAL FISTULA, CLOSURE OF PATENT FORAMEN OVALE;  Surgeon: Delight Ovens, MD;  Location:  [facility] OR;  Service: Open Heart Surgery;  Laterality: N/A;  . Tricuspid valve replacement N/A 12/30/2013    Procedure: REPAIR OF TRICUSPID VALVE LEAFLET;  Surgeon: Delight Ovens, MD;  Location: [facility] OR;  Service: Open Heart Surgery;  Laterality: N/A;  . Intraoperative transesophageal echocardiogram N/A 12/30/2013    Procedure: INTRAOPERATIVE TRANSESOPHAGEAL  ECHOCARDIOGRAM;  Surgeon: Delight Ovens, MD;  Location: [facility] OR;  Service: Open Heart Surgery;  Laterality: N/A;  . Permanent pacemaker insertion N/A 01/04/2014    Procedure: PERMANENT PACEMAKER INSERTION;  Surgeon: Marinus Maw, MD;  Location: [facility] CATH LAB;  Service: Cardiovascular;  Laterality: N/A;  . Insert / replace / remove pacemaker  01/04/2014  . Right heart catheterization N/A 02/12/2014    Procedure: RIGHT HEART CATH;  Surgeon: Laurey Morale, MD;  Location: [facility] CATH LAB;  Service: Cardiovascular;  Laterality: N/A;  . Tee without cardioversion N/A 02/12/2014    Procedure: TRANSESOPHAGEAL ECHOCARDIOGRAM (TEE);  Surgeon: Quintella Reichert, MD;  Location: [facility] ENDOSCOPY;  Service: Cardiovascular;  Laterality: N/A;  . Tricuspid valve replacement N/A 02/12/2014    Procedure: TRICUSPID VALVE REPAIR, REDO MEDIAN STERNOTOMY, REPAIR OF VENTRICULAR SEPTAL DEFECT (RECURRENT LV OUTFLOW TRACT TO RIGHT ATRIAL FISTULA), REDO CLOSURE OF PATENT FORAMEN OVALE;  Surgeon: Purcell Nails, MD;  Location: [facility] OR;  Service: Open Heart Surgery;  Laterality: N/A;  . Tee without cardioversion N/A 04/30/2014    Procedure: TRANSESOPHAGEAL ECHOCARDIOGRAM (TEE);  Surgeon: Jake Bathe, MD;  Location: [facility] ENDOSCOPY;  Service: Cardiovascular;  Laterality: N/A;    History   Social History  . Marital Status: Single    Spouse Name: N/A  . Number of Children: N/A  . Years of Education: N/A   Occupational History  . Not on file.   Social History Main Topics  . Smoking status: Never Smoker   . Smokeless tobacco: Former Neurosurgeon    Quit date: 12/30/2013  . Alcohol Use: 0.0 oz/week    0 Standard drinks or equivalent per week  . Drug Use: No  . Sexual Activity: Yes    Birth Control/ Protection: Condom   Other Topics Concern  . Not on file   Social History Narrative    ROS: no fevers or chills, productive cough, hemoptysis, dysphasia, odynophagia, melena, hematochezia, dysuria, hematuria, rash, seizure activity,  orthopnea, PND, pedal edema, claudication. Remaining systems are negative.  Physical Exam: Well-developed well-nourished in no acute distress.  Skin is warm and dry.  HEENT is normal.  Neck is supple.  Chest is clear to auscultation with normal expansion.  Cardiovascular exam is regular rate and rhythm. 2/6 systolic murmur left sternal border. No diastolic murmur. Abdominal exam nontender or distended. No masses palpated. Extremities show no edema. neuro grossly intact  ECG sinus rhythm with ventricular pacing.

## 2014-09-17 ENCOUNTER — Ambulatory Visit (INDEPENDENT_AMBULATORY_CARE_PROVIDER_SITE_OTHER): Payer: BC Managed Care – PPO | Admitting: Cardiology

## 2014-09-17 ENCOUNTER — Encounter: Payer: Self-pay | Admitting: Cardiology

## 2014-09-17 VITALS — BP 116/80 | HR 81 | Ht 65.0 in | Wt 217.9 lb

## 2014-09-17 DIAGNOSIS — Z953 Presence of xenogenic heart valve: Secondary | ICD-10-CM

## 2014-09-17 DIAGNOSIS — I351 Nonrheumatic aortic (valve) insufficiency: Secondary | ICD-10-CM | POA: Diagnosis not present

## 2014-09-17 DIAGNOSIS — Z954 Presence of other heart-valve replacement: Secondary | ICD-10-CM

## 2014-09-17 NOTE — Assessment & Plan Note (Signed)
Followed by electrophysiology. 

## 2014-09-17 NOTE — Assessment & Plan Note (Signed)
Most recent echocardiogram did not show significant vegetation. Patient continues on long-term oral anti-biotics. He is being followed by infectious disease. No recent fevers or chills. He is being followed by Dr Lowella Fairy as well. He appears to be doing well with conservative measures at this point. I will see him back in 3 months and we may repeat his echo at that time.

## 2014-09-17 NOTE — Patient Instructions (Signed)
Your physician recommends that you schedule a follow-up appointment in: 3 MONTHS WITH DR CRENSHAW  

## 2014-09-28 ENCOUNTER — Ambulatory Visit: Payer: BC Managed Care – PPO | Admitting: Cardiothoracic Surgery

## 2014-10-01 ENCOUNTER — Ambulatory Visit (INDEPENDENT_AMBULATORY_CARE_PROVIDER_SITE_OTHER): Payer: BC Managed Care – PPO | Admitting: Cardiothoracic Surgery

## 2014-10-01 ENCOUNTER — Encounter: Payer: Self-pay | Admitting: Cardiothoracic Surgery

## 2014-10-01 VITALS — BP 115/66 | HR 79 | Resp 16 | Ht 65.0 in | Wt 215.0 lb

## 2014-10-01 DIAGNOSIS — Z9889 Other specified postprocedural states: Secondary | ICD-10-CM

## 2014-10-01 DIAGNOSIS — I38 Endocarditis, valve unspecified: Secondary | ICD-10-CM

## 2014-10-01 DIAGNOSIS — Z8774 Personal history of (corrected) congenital malformations of heart and circulatory system: Secondary | ICD-10-CM

## 2014-10-01 NOTE — Progress Notes (Signed)
[address]       [place] [address]             [phone]      [name] Medical Record #[id number] Date of Birth: 08/03/1985  Referring: [name], MD Primary Care: [name], MD  Chief Complaint:   POST OP FOLLOW UP Past Surgical History  Procedure Laterality Date  . Ascending aortic root replacement N/A 12/30/2013    Procedure: ASCENDING AORTIC ROOT REPLACEMENT with 5mm HOMOGRAFT, CLOSURE OF AORTIC TO RIGHT ATRIAL FISTULA, CLOSURE OF PATENT FORAMEN OVALE;  Surgeon: Delight Ovens, MD;  Location: [facility] OR;  Service: Open Heart Surgery;  Laterality: N/A;  . Tricuspid valve replacement N/A 12/30/2013    Procedure: REPAIR OF TRICUSPID VALVE LEAFLET;  Surgeon: Delight Ovens, MD;  Location: [facility] OR;  Service: Open Heart Surgery;  Laterality: N/A;  . Intraoperative transesophageal echocardiogram N/A 12/30/2013    Procedure: INTRAOPERATIVE TRANSESOPHAGEAL ECHOCARDIOGRAM;  Surgeon: Delight Ovens, MD;  Location: [facility] OR;  Service: Open Heart Surgery;  Laterality: N/A;  . Permanent pacemaker insertion N/A 01/04/2014    Procedure: PERMANENT PACEMAKER INSERTION;  Surgeon: Marinus Maw, MD;  Location: [facility] CATH LAB;  Service: Cardiovascular;  Laterality: N/A;  . Insert / replace / remove pacemaker  01/04/2014  . Right heart catheterization N/A 02/12/2014    Procedure: RIGHT HEART CATH;  Surgeon: Laurey Morale, MD;  Location: [facility] CATH LAB;  Service: Cardiovascular;  Laterality: N/A;  . Tee without cardioversion N/A 02/12/2014    Procedure: TRANSESOPHAGEAL ECHOCARDIOGRAM (TEE);  Surgeon: Quintella Reichert, MD;  Location: [facility] ENDOSCOPY;  Service: Cardiovascular;  Laterality: N/A;  . Tricuspid valve replacement N/A 02/12/2014    Procedure: TRICUSPID VALVE REPAIR, REDO MEDIAN STERNOTOMY, REPAIR OF VENTRICULAR SEPTAL DEFECT (RECURRENT LV OUTFLOW TRACT TO RIGHT ATRIAL FISTULA), REDO CLOSURE OF PATENT FORAMEN OVALE;  Surgeon: Purcell Nails, MD;   Location: [facility] OR;  Service: Open Heart Surgery;  Laterality: N/A;  . 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 He recovered remarkably quickly, although he required permanent pacemaker placement for complete heart block on 01/04/2014. He was treated with a 6 week course of intravenous high-dose Rocephin and made excellent progress over the next several weeks. 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 He  underwent emergency redo median sternotomy for ventricular septal defect repair, redo tricuspid valve repair, and redo closure of patent foramen ovale on 02/12/2014. His subsequent recovery after the second operation was uneventful, and he was again discharged from the hospital on home IV antibiotics. He completed 2 weeks of intravenous gentamicin and he remains on high-dose Rocephin at this time. A follow-up echocardiogram was obtained 03/09/2014 that revealed intact repair of his  fistula. He was seen in follow-up by Dr. Daiva Eves at the infectious disease clinic 03/10/2014. Although intraoperative cultures obtained at the time of the patient's second operation on  02/12/2014 did not grow any bacteria, tissue was sent off to the [facility] in [place] where PCR analysis was again positive for group B Streptococcus.     In March 2016 a repeat ECHO suggested: - Aortic valve: There is a large (1.5 x 1.1cm) mobile irregular bordered echodensity along the anterior aspect of the subvalvular apparatus of the bioprosthetic aortic valve in the left ventricular outflow tract consistent with vegetation. Although the valve leaflets do not seem to be compromised, there is reverberation artifact from bioprosthetic ring, and vegetation may be attached to base of the leaflet. There is perivalvular abscess like pocket/space in the posterior region of aortic valve adjacent to intraatrial septum. There was mild regurgitation. - Mitral valve: No evidence of vegetation. There was mild regurgitation.  Repeat echo done last week  Showed marked improvement: There was a small, mobile vegetation.   Patient continues to do well, he's had no fever or chills, return to near-normal activities including some weight lifting. He he's gotten the okay from his employer to return to work with a pacemaker.        Past Medical History  Diagnosis Date  . Obesity   . Smokeless tobacco use   . Fracture of left femur   . Meningitis     Group B Strep (November 2015)  . Patent foramen ovale     Closed during procedure on December 30, 2013  . History of open heart surgery     December 30, 2013- ascending aortic root replacement, TEE  . Endocarditis     related to meningitis   . Brain abscess   . Presence of permanent cardiac pacemaker 01/04/2014    PPM MEDTRONIC FOR COMPLETE HEART BLOCK  . Thrombocytopenia 12/2013  . AKI (acute kidney injury)   . 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 Tricuspid regurgitation 02/11/2014   . 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 S/P ventricular septal defect repair + redo tricuspid valve repair + redo closure PFO 02/12/2014    Redo sternotomy for bovine pericardial patch repair of large ventricular septal defect (recurrent LVOT to RA fistula due to bacterial endocarditis)  . S/P redo tricuspid valve repair 02/12/2014    Complex valvuloplasty including patch closure of VSD with plication of septal leaflet and 26 mm Edwards mc3 ring annuloplasty  . S/P redo patent foramen ovale closure 02/12/2014  . S/P placement of cardiac pacemaker 01/04/2014    [employer] (serial number [id number]) dual chamber permanent pacemaker implanted by Dr Ladona Ridgel  . moderate residual aortic insufficiency s/p allograft aortic root replacement 03/08/2014  . Drug-induced leukopenia 06/15/2014     History  Smoking status  . Never Smoker   Smokeless tobacco  . Former Neurosurgeon  . Quit date: 12/30/2013    History  Alcohol Use  . 0.0 oz/week  . 0 Standard drinks or equivalent per week     Allergies  Allergen Reactions  . Cefepime Other (See Comments)    Leukopenia not clear if due to vancomycin vs cefepime  . Vancomycin Other (See Comments)    ? Drug induced leukopenia vs being due to cefepime  Current Outpatient Prescriptions  Medication Sig Dispense Refill  . aspirin EC 325 MG EC tablet Take 1 tablet (325 mg total) by mouth daily. 30 tablet 0  . levofloxacin (LEVAQUIN) 750 MG tablet Take 1 tablet (750 mg total) by mouth daily. 30 tablet 5   No current facility-administered medications for this visit.       Physical Exam: BP 115/66 mmHg  Pulse 79  Resp 16  Ht 5\' 5"  (1.651 m)  Wt 215 lb (97.523 kg)  BMI 35.78 kg/m2  SpO2 98%  General appearance: alert and cooperative Neurologic: intact Heart: Since last seen the character of  his murmur has significantly improved , now there is an early systolic ejection  murmur I do not appreciate any murmur of aortic insufficiency  Lungs: clear to auscultation bilaterally, do not appreciate any pleural rub Abdomen: soft, non-tender; bowel sounds normal; no masses,  no organomegaly Extremities: extremities normal, atraumatic, no cyanosis or edema and Homans sign is negative, no sign of DVT Wound: Sternum is stable and well-healed He has no flank tenderness on either side He has no pedal edema  Diagnostic Studies & Laboratory data:       Echo: 08/2014 Study Conclusions  - Left ventricle: The cavity size was normal. Wall thickness was normal. Systolic function was normal. The estimated ejection fraction was in the range of 50% to 55%. Dyskinesis of the apicalanteroseptal myocardium. - Aortic valve: A bioprosthesis was present. There was mild regurgitation. - Right atrium: The atrium was mildly dilated  05/2014: Study discussed with Dr Jens Som, marked improvement compared to previous echo/TEE 04/30/2014 done during last admission Study Conclusions  - Left ventricle: The cavity size was normal. Wall thickness was normal. Systolic function was normal. The estimated ejection fraction was in the range of 50% to 55%. Doppler parameters are consistent with a reversible restrictive pattern, indicative of decreased left ventricular diastolic compliance and/or increased left atrial pressure (grade 3 diastolic dysfunction). - Ventricular septum: Septal motion showed abnormal function and dyssynergy. - Aortic valve: A bioprosthesis was present. There was a small, mobile vegetation. Valve area (VTI): 1.43 cm^2. Valve area (Vmax): 1.23 cm^2. Valve area (Vmean): 1.3 cm^2. - Mitral valve: There was mild regurgitation. - Left atrium: The atrium was mildly dilated    03/09/2014 Study Conclusions  - Left ventricle: There was mild concentric hypertrophy. Systolic function was mildly reduced. The estimated ejection fraction was in  the range of 45% to 50%. Features are consistent with a pseudonormal left ventricular filling pattern, with concomitant abnormal relaxation and increased filling pressure (grade 2 diastolic dysfunction). - Ventricular septum: Septal motion showed paradox. - Aortic valve: A bioprosthetic valve sits well in the aortic position. There is no paravalvular leak and moderate central regurgitation. Transaortic gradients are higher when compared to the prior study and are at the upper normal border. Mean gradient 18 mmHg. There was moderate regurgitation. Valve area (VTI): 1.4 cm^2. Valve area (Vmax): 1.09 cm^2. Valve area (Vmean): 1.19 cm^2. - Aorta: Aortic root dimension: 33 mm (ED). - Mitral valve: There was mild regurgitation. - Left atrium: The atrium was mildly dilated. - Right ventricle: Systolic function was mildly reduced. - Tricuspid valve: S/P tricuspid valve repair. Normal tricuspid gradients. - Pulmonary arteries: Systolic pressure was within the normal range. - Inferior vena cava: The vessel was normal in size. The respirophasic diameter changes were in the normal range (= 50%), consistent with normal central venous pressure. - Pericardium, extracardiac: A trivial pericardial effusion was identified.  Impressions:  -  Compared to the prior study from 02/11/2014, the left ventricular ejection fraction is mildly impaired estimated at 45-50%. A bioprosthetic valve sits well in the aortic position. There is no paravalvular leak and moderate central regurgitation. Transaortic gradients are higher when compared to the prior study and are at the upper normal border. Mean gradient 18 mmHg. A patch is seen in the membranous portion of the interventricular septum. No flow is seen. Mild left atrial dilatation. S/P tricuspid valve repair with only minimal tricuspid regurgitation and normal RVSP (previously severe TR and severe pulmonary  hypertension).  Recent Lab Findings: Lab Results  Component Value Date   WBC 5.2 05/03/2014   HGB 11.2* 05/03/2014   HCT 33.5* 05/03/2014   PLT 178 05/03/2014   GLUCOSE 186* 05/03/2014   TRIG 119 12/29/2013   ALT 21 04/29/2014   AST 18 04/29/2014   NA 137 05/03/2014   K 3.9 05/03/2014   CL 103 05/03/2014   CREATININE 0.97 05/03/2014   BUN 19 05/03/2014   CO2 29 05/03/2014   TSH 2.109 05/03/2014   INR 1.40 02/13/2014      Assessment / Plan:   Patient doing well following second surgery for complex endocarditis involving the aortic valve aortic root and tricuspid valve, currently the surgical repair appears stable. With current therpy the vegation noted in march is improved. He now continues on levaquin po Cardiology  And ID continue to follow Will see as need , at patient or cardiology request     Delight Ovens MD      [address] [place] [address] Office [phone]   Beeper [phone]  10/01/2014 3:13 PM

## 2014-10-05 ENCOUNTER — Ambulatory Visit (INDEPENDENT_AMBULATORY_CARE_PROVIDER_SITE_OTHER): Payer: BC Managed Care – PPO | Admitting: *Deleted

## 2014-10-05 DIAGNOSIS — I442 Atrioventricular block, complete: Secondary | ICD-10-CM | POA: Diagnosis not present

## 2014-10-05 NOTE — Progress Notes (Signed)
Remote pacemaker transmission.   

## 2014-10-12 LAB — CUP PACEART REMOTE DEVICE CHECK
Battery Impedance: 100 Ohm
Battery Voltage: 2.79 V
Brady Statistic AP VP Percent: 2 %
Brady Statistic AP VS Percent: 0 %
Brady Statistic AS VP Percent: 98 %
Date Time Interrogation Session: 20160823121544
Lead Channel Impedance Value: 435 Ohm
Lead Channel Pacing Threshold Amplitude: 0.625 V
Lead Channel Pacing Threshold Amplitude: 0.75 V
Lead Channel Pacing Threshold Pulse Width: 0.4 ms
Lead Channel Sensing Intrinsic Amplitude: 1.4 mV
Lead Channel Setting Pacing Amplitude: 1.5 V
Lead Channel Setting Pacing Amplitude: 2.5 V
Lead Channel Setting Pacing Pulse Width: 0.4 ms
Lead Channel Setting Sensing Sensitivity: 4 mV
MDC IDC MSMT BATTERY REMAINING LONGEVITY: 125 mo
MDC IDC MSMT LEADCHNL RV IMPEDANCE VALUE: 405 Ohm
MDC IDC MSMT LEADCHNL RV PACING THRESHOLD PULSEWIDTH: 0.4 ms
MDC IDC STAT BRADY AS VS PERCENT: 0 %

## 2014-10-25 ENCOUNTER — Ambulatory Visit: Payer: BC Managed Care – PPO | Admitting: Infectious Disease

## 2014-10-27 ENCOUNTER — Encounter: Payer: Self-pay | Admitting: Cardiology

## 2014-11-01 ENCOUNTER — Ambulatory Visit: Payer: BC Managed Care – PPO | Admitting: Infectious Disease

## 2014-11-04 ENCOUNTER — Encounter: Payer: Self-pay | Admitting: Internal Medicine

## 2014-11-15 ENCOUNTER — Encounter: Payer: Self-pay | Admitting: Infectious Disease

## 2014-11-15 ENCOUNTER — Ambulatory Visit (INDEPENDENT_AMBULATORY_CARE_PROVIDER_SITE_OTHER): Payer: BC Managed Care – PPO | Admitting: Infectious Disease

## 2014-11-15 VITALS — BP 118/80 | HR 72 | Temp 98.0°F | Ht 65.0 in | Wt 225.0 lb

## 2014-11-15 DIAGNOSIS — I442 Atrioventricular block, complete: Secondary | ICD-10-CM

## 2014-11-15 DIAGNOSIS — Z23 Encounter for immunization: Secondary | ICD-10-CM | POA: Diagnosis not present

## 2014-11-15 DIAGNOSIS — T826XXA Infection and inflammatory reaction due to cardiac valve prosthesis, initial encounter: Secondary | ICD-10-CM | POA: Insufficient documentation

## 2014-11-15 DIAGNOSIS — I38 Endocarditis, valve unspecified: Secondary | ICD-10-CM

## 2014-11-15 DIAGNOSIS — I330 Acute and subacute infective endocarditis: Secondary | ICD-10-CM | POA: Diagnosis not present

## 2014-11-15 DIAGNOSIS — A401 Sepsis due to streptococcus, group B: Secondary | ICD-10-CM

## 2014-11-15 DIAGNOSIS — Z950 Presence of cardiac pacemaker: Secondary | ICD-10-CM

## 2014-11-15 DIAGNOSIS — Z954 Presence of other heart-valve replacement: Secondary | ICD-10-CM

## 2014-11-15 DIAGNOSIS — T826XXD Infection and inflammatory reaction due to cardiac valve prosthesis, subsequent encounter: Secondary | ICD-10-CM

## 2014-11-15 DIAGNOSIS — Z9889 Other specified postprocedural states: Secondary | ICD-10-CM

## 2014-11-15 DIAGNOSIS — Z953 Presence of xenogenic heart valve: Secondary | ICD-10-CM

## 2014-11-15 DIAGNOSIS — G060 Intracranial abscess and granuloma: Secondary | ICD-10-CM

## 2014-11-15 DIAGNOSIS — Z8774 Personal history of (corrected) congenital malformations of heart and circulatory system: Secondary | ICD-10-CM

## 2014-11-15 DIAGNOSIS — D702 Other drug-induced agranulocytosis: Secondary | ICD-10-CM

## 2014-11-15 HISTORY — DX: Endocarditis, valve unspecified: I38

## 2014-11-15 HISTORY — DX: Acute and subacute infective endocarditis: I33.0

## 2014-11-15 MED ORDER — LEVOFLOXACIN 750 MG PO TABS: 750.0000 mg | ORAL_TABLET | Freq: Every day | ORAL | Status: DC

## 2014-11-15 NOTE — Progress Notes (Signed)
Chief complaint: followup for endocarditis, aortic abscess, brain abscesses due to Group B streptococci  Subjective:    Patient ID: [name], male    DOB: [birth year], 29 y.o.   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 Valve sent to [facility] showed GBS by ribosomal 16S sequencing.   He has had trouble tolerating the high dose ceftriaxone and also completed 2 weeks of gentamicin. He was on Rocephin when I saw him on 02/02/14 and getting used to the abx but a few days later was found to have worsening dyspnea, heart failure change in heart sounds and was admitted and found  To have new Ventricular Septal Defect (recurrent LV outflow tract to right atrial fistula, Severe Tricuspid Regurgitatio, Recurrent Patent Foramen Ovale, and Acute Congestive Heart Failure.   He underwent :   Redo Median Sternotomy  Repair of Ventricular Septal Defect Double layer bovine pericardial patch closure  Tricuspid Valve Repair Complex Valvuloplasty including plication of septal leaflet Edwards mc3 Ring annuloplasty  On January 1st, 2016. Tissue was sent off to [facility] where PCR of Ribosome AGAIN WAS POSITIVE FOR GROUP B STREPTOCOCCUS.      NOTE Pacemaker from first CT surgery had remained in place through 2nd CT surgery and he is Pacer dependent.   IN the interim he was treated withn Rocephin high dose along with 2 weeks of gentamicin which he has finished).  His repeat Echo showed stability of his postoperative repairs.  He was then on oral amoxicillin and without any symptoms. His fiance was resting her head on his chest and  discovered a new murmur.  He was evaluated and had 2d echo showing  mobile vegetation measuring 1.3 x 1.7 cm seemingly attached to the bioprosthetic aortic valve,with mild to moderate AI on 04/29/14.  He was brought into the hospital and underwent TEE which read as showing:  The same large (1.5 x 1.1cm) mobile irregular bordered echodensity along the anterior aspect of the subvalvular apparatus of the bioprosthetic aortic valve in the left ventricular outflow tract consistent with vegetation. There is perivalvular abscess like pocket/space in the posterior region of aortic valve adjacent to intraatrial septum. There was mild regurgitation.  He had blood cultures taken on amoxicillin which were negative and he was broadened ultimately to vancomycin and cefepime.   He was followed closely by Cardiology and CT surgery.   Dr. Tyrone Sage reviewed the TEE and noted that the  Lesion was in the outflow tract of the left ventricle and not on the aortic homograph leaflets. He did not recommend repeat 3rd open heart surgery.  Patient had  been dc to home on IV vancomycin and cefepime.   We found that he then developed leukopenia, vancomycin was stopped, but it did not quickly resolved  So we stopped cefepime as well. He was out of town so we bridged him with high dose oral levaquin  daily until he could get back to [facility] and he was switched to IV daptomycin which he has been on through his 7 week of his most recent round of abx therapy. TTE done in mid April  2106 shows that his vegetation has shrunk in size.  We then switched  him to high dose oral levaquin 750mg  daily  Since then repeat TTE done in July and read by Dr. Jens Som and Dr. Tyrone Sage and found to no longer have evidence of vegetation.  He is on levaquin today and doing very well and without symptoms. He wishes to return to work without restrictions and his employer is requesting that this recommendation be made by all of his  employers.  Past Medical History  Diagnosis Date  . Obesity   . Smokeless tobacco use   . Fracture of left femur (HCC)   . Meningitis     Group B Strep (November 2015)  . Patent foramen ovale     Closed during procedure on December 30, 2013  . History of open heart surgery     December 30, 2013- ascending aortic root replacement, TEE  . Endocarditis     related to meningitis   . Brain abscess   . Presence of permanent cardiac pacemaker 01/04/2014    PPM MEDTRONIC FOR COMPLETE HEART BLOCK  . Thrombocytopenia (HCC) 12/2013  . AKI (acute kidney injury) (HCC)   . 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 Tricuspid regurgitation 02/11/2014  . 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 S/P ventricular septal defect repair + redo tricuspid valve repair + redo closure PFO 02/12/2014    Redo sternotomy for bovine pericardial patch repair of large ventricular septal defect (recurrent LVOT to RA fistula due to bacterial endocarditis)  . S/P redo tricuspid valve repair 02/12/2014    Complex valvuloplasty including patch closure of VSD with plication of septal leaflet and 26 mm Edwards mc3 ring annuloplasty  . S/P redo patent foramen ovale closure 02/12/2014  . S/P placement of cardiac pacemaker 01/04/2014    Medtronic (serial number [id number]) dual chamber permanent pacemaker implanted by Dr Ladona Ridgel  . moderate residual aortic insufficiency s/p allograft aortic root replacement 03/08/2014  . Drug-induced leukopenia (HCC) 06/15/2014    Past Surgical History  Procedure Laterality Date  . Ascending aortic root replacement N/A 12/30/2013    Procedure: ASCENDING AORTIC ROOT REPLACEMENT with 20mm HOMOGRAFT, CLOSURE OF AORTIC TO RIGHT ATRIAL FISTULA, CLOSURE OF PATENT FORAMEN OVALE;  Surgeon: Delight Ovens, MD;   Location: [facility] OR;  Service: Open Heart Surgery;  Laterality: N/A;  . Tricuspid valve replacement N/A 12/30/2013    Procedure: REPAIR OF TRICUSPID VALVE LEAFLET;  Surgeon: Delight Ovens, MD;  Location: [facility] OR;  Service: Open Heart Surgery;  Laterality: N/A;  . Intraoperative transesophageal echocardiogram N/A 12/30/2013    Procedure: INTRAOPERATIVE TRANSESOPHAGEAL ECHOCARDIOGRAM;  Surgeon: Delight Ovens, MD;  Location: [facility];  Service: Open Heart Surgery;  Laterality: N/A;  . Permanent pacemaker insertion N/A 01/04/2014    Procedure: PERMANENT PACEMAKER INSERTION;  Surgeon: Marinus Maw, MD;  Location: [facility] CATH LAB;  Service: Cardiovascular;  Laterality: N/A;  . Insert / replace / remove pacemaker  01/04/2014  . Right heart catheterization N/A 02/12/2014    Procedure: RIGHT HEART CATH;  Surgeon: Laurey Morale, MD;  Location: [facility] CATH LAB;  Service: Cardiovascular;  Laterality: N/A;  . Tee without cardioversion N/A 02/12/2014    Procedure: TRANSESOPHAGEAL ECHOCARDIOGRAM (TEE);  Surgeon: Quintella Reichert, MD;  Location: [facility] ENDOSCOPY;  Service: Cardiovascular;  Laterality: N/A;  . Tricuspid valve replacement N/A 02/12/2014    Procedure:  TRICUSPID VALVE REPAIR, REDO MEDIAN STERNOTOMY, REPAIR OF VENTRICULAR SEPTAL DEFECT (RECURRENT LV OUTFLOW TRACT TO RIGHT ATRIAL FISTULA), REDO CLOSURE OF PATENT FORAMEN OVALE;  Surgeon: Purcell Nails, MD;  Location: [facility] OR;  Service: Open Heart Surgery;  Laterality: N/A;  . Tee without cardioversion N/A 04/30/2014    Procedure: TRANSESOPHAGEAL ECHOCARDIOGRAM (TEE);  Surgeon: Jake Bathe, MD;  Location: [facility] ENDOSCOPY;  Service: Cardiovascular;  Laterality: N/A;    Family History  Problem Relation Age of Onset  . Heart murmur Brother       Social History   Social History  . Marital Status: Single    Spouse Name: N/A  . Number of Children: N/A  . Years of Education: N/A   Social History Main Topics  . Smoking status: Never Smoker   . Smokeless tobacco:  Former Neurosurgeon    Quit date: 12/30/2013  . Alcohol Use: 0.0 oz/week    0 Standard drinks or equivalent per week     Comment: rarely  . Drug Use: No  . Sexual Activity: Yes    Birth Control/ Protection: Condom   Other Topics Concern  . None   Social History Narrative    Allergies  Allergen Reactions  . Cefepime Other (See Comments)    Leukopenia not clear if due to vancomycin vs cefepime  . Vancomycin Other (See Comments)    ? Drug induced leukopenia vs being due to cefepime     Current outpatient prescriptions:  .  aspirin EC 325 MG EC tablet, Take 1 tablet (325 mg total) by mouth daily., Disp: 30 tablet, Rfl: 0 .  levofloxacin (LEVAQUIN) 750 MG tablet, Take 1 tablet (750 mg total) by mouth daily., Disp: 30 tablet, Rfl: 11    .Review of Systems  Constitutional: Negative for fever, chills, diaphoresis, activity change and appetite change.  HENT: Negative for congestion, rhinorrhea, sinus pressure, sneezing, sore throat and trouble swallowing.   Eyes: Negative for photophobia and visual disturbance.  Respiratory: Negative for cough, chest tightness, shortness of breath, wheezing and stridor.   Cardiovascular: Negative for chest pain, palpitations and leg swelling.  Gastrointestinal: Negative for nausea, vomiting, diarrhea, constipation, blood in stool and abdominal distention.  Genitourinary: Negative for dysuria, hematuria, flank pain and difficulty urinating.  Musculoskeletal: Negative for myalgias, back pain, joint swelling, arthralgias and gait problem.  Skin: Negative for color change, pallor, rash and wound.  Neurological: Negative for dizziness, tremors, weakness and light-headedness.  Hematological: Negative for adenopathy. Does not bruise/bleed easily.  Psychiatric/Behavioral: Negative for behavioral problems, confusion, sleep disturbance, dysphoric mood, decreased concentration and agitation.       Objective:   Physical Exam  Constitutional: He is oriented to  person, place, and time. He appears well-developed and well-nourished.  HENT:  Head: Normocephalic and atraumatic.  Eyes: Conjunctivae and EOM are normal.  Neck: Normal range of motion. Neck supple.  Cardiovascular: Regular rhythm.  Bradycardia present.   Murmur heard.  Systolic murmur is present with a grade of 3/6    Pulmonary/Chest: Effort normal and breath sounds normal. No respiratory distress. He has no wheezes. He has no rales. He exhibits no tenderness.  Abdominal: Soft. Bowel sounds are normal. He exhibits no distension. There is no tenderness. There is no rebound.  Musculoskeletal: Normal range of motion. He exhibits no edema or tenderness.  Neurological: He is alert and oriented to person, place, and time.  Skin: Skin is warm and dry. No rash noted. No erythema. No pallor.  Psychiatric: He has a normal  mood and affect. His behavior is normal. Judgment and thought content normal.   PM, Sternotomy clean 07/21/14:             Assessment & Plan:   Group B streptococcal  Aortic valve endocarditis and Aorto-Right Atrial fistula sp AVR, TV repair, repair of Aorto to Right atrial fistula, placement of PM due to 3rd degree heart block, then Ventricular Septal Defect (recurrent LV outflow tract to right atrial fistula, Severe Tricuspid Regurgitatio, Recurrent Patent Foramen Ovale, and Acute Congestive Heart Failure. Sp Redo Median Sternotom, Repair of Ventricular Septal Defect, Double layer bovine pericardial patch closure, Tricuspid Valve Repair, Complex Valvuloplasty including plication of septal leaflet, Edwards mc3 Ring annuloplasty with Group B Streptococcus isolated from heart valve sp repeat IV rocephin x 6 weeks with 2 weeks of gentamicin, changed to oral amoxicillin but now yet again with evidence of large vegetation + possible abscess vs cavity. Blood cultures are NG on amoxicillin and he was broadened IV vancomycin cefepime with course complicated by leukopenia that resolved  when vancomycin and then cefepime stopped, bridged with levaquin then to daptomycin for 7 weeks of therapy follwed by high dose levaquin with recent TTE not showing vegetation anymore.   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  Continue oral levaquin  daily indefinitely.    I intend to keep him on long term oral antibiotics though I may want to rechallenge him with a beta lactam (ie amoxicillin) and see if he DOES NOT develop leukopenia  In terms of the original acquisition of the group b streptococcus today he and his girlfriend were discussing with my RN fact that his girlfriend had been practicing phlebotomy for veterinary school and apparently the patient himself tried to see if he could start an IV on himself and tried with success. This occurred several months prior to his endocarditis manifesting itself. He washed the site with soap and water. I suspect he may have thus seeded his heart valve. The concern that there could have been IVDU has been raised as well though we have never heard of suspicous behavior when home health companies visited him so perhaps this is the true origin of his bacteremia. Will discuss further with him at future visit.  I spent greater than 25  minutes with the patient including greater than 50% of time in face to face counsel of the patient (re his infected heart valve, potential pm infection potential drug induced leukopenia) and in coordination of their care including removal of PICC with [facility]. .   Drug induced leukopenia: not clear if it was due to vancomycin or cefepime. Would like to challenge with amoxicillin in the future

## 2014-11-23 ENCOUNTER — Telehealth: Payer: Self-pay | Admitting: Cardiology

## 2014-11-23 NOTE — Telephone Encounter (Signed)
Jru is calling because he is needing a release signed by Dr. Jens Som that he can go back to work . Please call   Thanks

## 2014-11-23 NOTE — Telephone Encounter (Signed)
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 Olga Millers

## 2014-11-23 NOTE — Telephone Encounter (Signed)
Spoke with pt, there will be a packet of information faxed describing the work expectations for [place] to review. Will await fax.

## 2014-11-24 ENCOUNTER — Telehealth: Payer: Self-pay | Admitting: *Deleted

## 2014-11-24 ENCOUNTER — Telehealth: Payer: Self-pay | Admitting: Cardiology

## 2014-11-24 NOTE — Telephone Encounter (Signed)
I signed it thanks Annice Pih!

## 2014-11-24 NOTE — Telephone Encounter (Signed)
He sent a fax yesterday for Dr Jens Som to sign. He wanted to know if he received and sent it back?

## 2014-11-24 NOTE — Telephone Encounter (Signed)
Dr. Daiva Eves did sign off on this form and it was faxed to [phone]. Patient aware, and original put in box upfront for scan. [name]

## 2014-11-24 NOTE — Telephone Encounter (Signed)
Patient faxed a copy of his work duties and is requesting a release to return to work. Advised him that this may need to come from another MD and he stated he is having all  MDs involved in his care to sign off on his return. Fax [;aced in Dr. Zenaida Niece Dam's box for review. Wendall Mola

## 2014-11-24 NOTE — Telephone Encounter (Signed)
Spoke with [name], aware we have the paperwork and dr Jens Som will look at it Friday and then will be able to get faxed back. Patient voiced understanding

## 2014-11-26 NOTE — Telephone Encounter (Signed)
Pt wants to know if the papers he sent on Monday are ready? Thank you.

## 2014-11-26 NOTE — Telephone Encounter (Signed)
Spoke with pt, return to work note faxed to [phone].

## 2014-12-01 ENCOUNTER — Telehealth: Payer: Self-pay | Admitting: *Deleted

## 2014-12-01 NOTE — Telephone Encounter (Signed)
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

## 2014-12-08 ENCOUNTER — Telehealth: Payer: Self-pay | Admitting: Cardiology

## 2014-12-08 NOTE — Telephone Encounter (Signed)
Spoke with pt, aware we are in the [place] office today. Will look for paperwork in the [place] office tomorrow and get signed.

## 2014-12-08 NOTE — Telephone Encounter (Signed)
Pt called in stating that a "704 medical release form" was faxed to the office that he needed Dr. Jens Som to sign off on. Please call  Thanks

## 2014-12-09 NOTE — Telephone Encounter (Signed)
Left message for pt to call.

## 2014-12-09 NOTE — Telephone Encounter (Signed)
Pt is returning [name]'s call .  °

## 2014-12-09 NOTE — Telephone Encounter (Signed)
Spoke with pt, questions regarding short term disability benefits answered. paperwork faxed to [phone]

## 2014-12-09 NOTE — Telephone Encounter (Signed)
Mr.Slaven is returning your call

## 2014-12-10 ENCOUNTER — Telehealth: Payer: Self-pay | Admitting: Cardiology

## 2014-12-10 NOTE — Telephone Encounter (Signed)
Returned call to patient.He stated his work will be faxing back work release.Stated it needs to say may return to work with no restrictions.Message sent to Dr.Crenshaw'a nurse Deliah Goody RN.

## 2014-12-10 NOTE — Telephone Encounter (Signed)
Pt called in stating that is a question on his work release form that was filled out incorrectly. He says it states that he is to return with restrictions but he was told by [employer] that he had NO restrictions. He would like to see if another nurse could correct that on the form that's being faxed back over to the office and sent back to his job.  Thanks

## 2014-12-13 ENCOUNTER — Telehealth: Payer: Self-pay | Admitting: Cardiology

## 2014-12-13 NOTE — Telephone Encounter (Signed)
Patient wants paperwork faxed to [phone].

## 2014-12-13 NOTE — Telephone Encounter (Signed)
paperwork faxed to the number provided 

## 2014-12-13 NOTE — Telephone Encounter (Signed)
Mr. Tommaso is calling about a return to work letter . Please call [phone]

## 2014-12-13 NOTE — Telephone Encounter (Signed)
Spoke with pt, paperwork questions answered and re-faxed to [phone]

## 2014-12-14 NOTE — Progress Notes (Signed)
HPI: FU AVR; initially seen in Nov 2015 with "flu" that ended up being bacterial endocarditis. He underwent aortic root and valve replacement with fistula repair. At that time he also had CHB and had a MDT PTVDP placed. Jan 2015 he had symptoms of dyspnea and was re evaluated. A TEE was done and and revealed a large VSD with left to right shunting from LVOT. On 02/12/2014 he underwent Redo Median Sternotomy, Repair of a VSD, Tricuspid Valve Repair, and Redo closure of PFO.Patient had a repeat echocardiogram on 04/29/2014 that showed mild global reduction in LV function. There was a mobile mass on the aortic valve concerning for vegetation as well as mild to moderate aortic insufficiency. The patient was admitted and IV antibiotics resumed. Blood cultures remain negative. Transesophageal echocardiogram revealed low normal LV function and an aortic valve mass. 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 Patient was evaluated by Dr. Tyrone Sage and medical therapy recommended. Last echo 7/16 showed normal LV function, bioprosthetic aortic valve with mild AI; mild RAE; no vegetation. Since last seen, the patient denies any dyspnea on exertion, orthopnea, PND, pedal edema, palpitations, syncope or chest pain.  Current Outpatient Prescriptions  Medication Sig Dispense Refill  . aspirin EC 325 MG EC tablet Take 1 tablet (325 mg total) by mouth daily. 30 tablet 0  . levofloxacin (LEVAQUIN) 750 MG tablet Take 1 tablet (750 mg total) by mouth daily. 30 tablet 11   No current facility-administered medications for this visit.     Past Medical History  Diagnosis Date  . Obesity   . Smokeless tobacco use   . Fracture of left femur (HCC)   . Meningitis     Group B Strep (November 2015)  . Patent foramen ovale     Closed during procedure on December 30, 2013  . History of open heart surgery     December 30, 2013- ascending aortic root replacement, TEE  . Endocarditis    related to meningitis   . Brain abscess   . Presence of permanent cardiac pacemaker 01/04/2014    PPM MEDTRONIC FOR COMPLETE HEART BLOCK  . Thrombocytopenia (HCC) 12/2013  . AKI (acute kidney injury) (HCC)   . 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 Tricuspid regurgitation 02/11/2014  . 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 S/P ventricular septal defect repair + redo tricuspid valve repair + redo closure PFO 02/12/2014    Redo sternotomy for bovine pericardial patch repair of large ventricular septal defect (recurrent LVOT to RA fistula due to bacterial endocarditis)  . S/P redo tricuspid valve repair 02/12/2014    Complex valvuloplasty including patch closure of VSD with plication of septal leaflet and 26 mm Edwards mc3 ring annuloplasty  . S/P redo patent foramen ovale closure 02/12/2014  . S/P placement of cardiac pacemaker 01/04/2014    Medtronic (serial number [id number]) dual chamber permanent pacemaker implanted by Dr Ladona Ridgel  . moderate residual aortic insufficiency s/p allograft aortic root replacement 03/08/2014  . Drug-induced leukopenia (HCC) 06/15/2014  . 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 Ascending aortic root replacement N/A 12/30/2013    Procedure: ASCENDING AORTIC ROOT REPLACEMENT with 66mm HOMOGRAFT, CLOSURE OF AORTIC TO RIGHT ATRIAL FISTULA, CLOSURE OF PATENT  FORAMEN OVALE;  Surgeon: Delight Ovens, MD;  Location: [facility] OR;  Service: Open Heart Surgery;  Laterality: N/A;  . Tricuspid valve replacement N/A 12/30/2013    Procedure: REPAIR OF TRICUSPID VALVE LEAFLET;  Surgeon: Delight Ovens, MD;  Location: [facility] OR;  Service: Open Heart Surgery;  Laterality: N/A;  . Intraoperative transesophageal echocardiogram N/A  12/30/2013    Procedure: INTRAOPERATIVE TRANSESOPHAGEAL ECHOCARDIOGRAM;  Surgeon: Delight Ovens, MD;  Location: [facility] OR;  Service: Open Heart Surgery;  Laterality: N/A;  . Permanent pacemaker insertion N/A 01/04/2014    Procedure: PERMANENT PACEMAKER INSERTION;  Surgeon: Marinus Maw, MD;  Location: [facility] CATH LAB;  Service: Cardiovascular;  Laterality: N/A;  . Insert / replace / remove pacemaker  01/04/2014  . Right heart catheterization N/A 02/12/2014    Procedure: RIGHT HEART CATH;  Surgeon: Laurey Morale, MD;  Location: [facility] CATH LAB;  Service: Cardiovascular;  Laterality: N/A;  . Tee without cardioversion N/A 02/12/2014    Procedure: TRANSESOPHAGEAL ECHOCARDIOGRAM (TEE);  Surgeon: Quintella Reichert, MD;  Location: [facility] ENDOSCOPY;  Service: Cardiovascular;  Laterality: N/A;  . Tricuspid valve replacement N/A 02/12/2014    Procedure: TRICUSPID VALVE REPAIR, REDO MEDIAN STERNOTOMY, REPAIR OF VENTRICULAR SEPTAL DEFECT (RECURRENT LV OUTFLOW TRACT TO RIGHT ATRIAL FISTULA), REDO CLOSURE OF PATENT FORAMEN OVALE;  Surgeon: Purcell Nails, MD;  Location: [facility] OR;  Service: Open Heart Surgery;  Laterality: N/A;  . Tee without cardioversion N/A 04/30/2014    Procedure: TRANSESOPHAGEAL ECHOCARDIOGRAM (TEE);  Surgeon: Jake Bathe, MD;  Location: [facility] ENDOSCOPY;  Service: Cardiovascular;  Laterality: N/A;    Social History   Social History  . Marital Status: Single    Spouse Name: N/A  . Number of Children: N/A  . Years of Education: N/A   Occupational History  . Not on file.   Social History Main Topics  . Smoking status: Never Smoker   . Smokeless tobacco: Former Neurosurgeon    Quit date: 12/30/2013  . Alcohol Use: 0.0 oz/week    0 Standard drinks or equivalent per week     Comment: rarely  . Drug Use: No  . Sexual Activity: Yes    Birth Control/ Protection: Condom   Other Topics Concern  . Not on file   Social History Narrative    ROS: no fevers or chills, productive cough, hemoptysis, dysphasia,  odynophagia, melena, hematochezia, dysuria, hematuria, rash, seizure activity, orthopnea, PND, pedal edema, claudication. Remaining systems are negative.  Physical Exam: Well-developed well-nourished in no acute distress.  Skin is warm and dry.  HEENT is normal.  Neck is supple.  Chest is clear to auscultation with normal expansion.  Cardiovascular exam is regular rate and rhythm. 2/6 systolic murmur left sternal border. No diastolic murmur. Abdominal exam nontender or distended. No masses palpated. Extremities show no edema. neuro grossly intact

## 2014-12-17 ENCOUNTER — Ambulatory Visit (INDEPENDENT_AMBULATORY_CARE_PROVIDER_SITE_OTHER): Payer: BC Managed Care – PPO | Admitting: Cardiology

## 2014-12-17 ENCOUNTER — Encounter: Payer: Self-pay | Admitting: *Deleted

## 2014-12-17 ENCOUNTER — Encounter: Payer: Self-pay | Admitting: Cardiology

## 2014-12-17 VITALS — BP 108/78 | HR 84 | Ht 65.0 in | Wt 237.4 lb

## 2014-12-17 DIAGNOSIS — Z953 Presence of xenogenic heart valve: Secondary | ICD-10-CM

## 2014-12-17 DIAGNOSIS — I359 Nonrheumatic aortic valve disorder, unspecified: Secondary | ICD-10-CM

## 2014-12-17 DIAGNOSIS — Z954 Presence of other heart-valve replacement: Secondary | ICD-10-CM

## 2014-12-17 NOTE — Assessment & Plan Note (Signed)
Followed by electrophysiology. 

## 2014-12-17 NOTE — Patient Instructions (Signed)
Medication Instructions:   NO CHANGE  Testing/Procedures:  Your physician has requested that you have an echocardiogram. Echocardiography is a painless test that uses sound waves to create images of your heart. It provides your doctor with information about the size and shape of your heart and how well your heart's chambers and valves are working. This procedure takes approximately one hour. There are no restrictions for this procedure.    Follow-Up:  Your physician wants you to follow-up in: 6 MONTHS WITH DR Frederick Peers will receive a reminder letter in the mail two months in advance. If you don't receive a letter, please call our office to schedule the follow-up appointment.   If you need a refill on your cardiac medications before your next appointment, please call your pharmacy.

## 2014-12-17 NOTE — Assessment & Plan Note (Signed)
Plan continue SBE prophylaxis. He remains on antibiotics per infectious disease. We will repeat his echocardiogram to make sure that aortic valve vegetation has resolved. He is asymptomatic. We have told him he may return to work.

## 2014-12-22 ENCOUNTER — Telehealth: Payer: Self-pay | Admitting: Cardiology

## 2014-12-22 NOTE — Telephone Encounter (Signed)
Will forward for dr crenshaw review  

## 2014-12-22 NOTE — Telephone Encounter (Signed)
[name] called in stating that a verbal consultation needs to happen between his doctor and Dr. Jens Som in regards to the pt. [name] also says that he faxed over a " Release of information" form to our office for the pt. Please f/u with him as soon as possible  Thanks

## 2014-12-24 ENCOUNTER — Other Ambulatory Visit: Payer: Self-pay

## 2014-12-24 ENCOUNTER — Other Ambulatory Visit: Payer: Self-pay | Admitting: Cardiology

## 2014-12-24 ENCOUNTER — Ambulatory Visit (HOSPITAL_COMMUNITY): Payer: BC Managed Care – PPO | Attending: Cardiology

## 2014-12-24 DIAGNOSIS — I351 Nonrheumatic aortic (valve) insufficiency: Secondary | ICD-10-CM | POA: Diagnosis not present

## 2014-12-24 DIAGNOSIS — I359 Nonrheumatic aortic valve disorder, unspecified: Secondary | ICD-10-CM | POA: Diagnosis not present

## 2014-12-24 DIAGNOSIS — I071 Rheumatic tricuspid insufficiency: Secondary | ICD-10-CM | POA: Insufficient documentation

## 2014-12-24 DIAGNOSIS — Z950 Presence of cardiac pacemaker: Secondary | ICD-10-CM | POA: Diagnosis not present

## 2014-12-24 DIAGNOSIS — I340 Nonrheumatic mitral (valve) insufficiency: Secondary | ICD-10-CM | POA: Insufficient documentation

## 2014-12-24 DIAGNOSIS — R29898 Other symptoms and signs involving the musculoskeletal system: Secondary | ICD-10-CM | POA: Diagnosis not present

## 2014-12-24 DIAGNOSIS — I358 Other nonrheumatic aortic valve disorders: Secondary | ICD-10-CM | POA: Diagnosis not present

## 2014-12-25 ENCOUNTER — Encounter: Payer: Self-pay | Admitting: Cardiothoracic Surgery

## 2014-12-28 NOTE — Telephone Encounter (Signed)
Would be happy to speak with his physician. Olga Millers

## 2014-12-29 NOTE — Telephone Encounter (Signed)
Left message to call.

## 2014-12-29 NOTE — Telephone Encounter (Signed)
Dr Jens Som spoke with the doctor and answered all their questions.

## 2015-02-08 ENCOUNTER — Encounter: Payer: Self-pay | Admitting: *Deleted

## 2015-04-07 ENCOUNTER — Encounter: Payer: Self-pay | Admitting: Internal Medicine

## 2015-04-07 ENCOUNTER — Ambulatory Visit (INDEPENDENT_AMBULATORY_CARE_PROVIDER_SITE_OTHER): Payer: BC Managed Care – PPO | Admitting: Internal Medicine

## 2015-04-07 VITALS — BP 108/84 | HR 84 | Ht 65.0 in | Wt 236.8 lb

## 2015-04-07 DIAGNOSIS — Z950 Presence of cardiac pacemaker: Secondary | ICD-10-CM | POA: Diagnosis not present

## 2015-04-07 LAB — CUP PACEART INCLINIC DEVICE CHECK
Battery Impedance: 112 Ohm
Battery Remaining Longevity: 123 mo
Battery Voltage: 2.78 V
Brady Statistic AS VP Percent: 97 %
Implantable Lead Implant Date: 20151123
Implantable Lead Implant Date: 20151123
Implantable Lead Location: 753860
Implantable Lead Model: 5076
Implantable Lead Model: 5076
Lead Channel Impedance Value: 404 Ohm
Lead Channel Pacing Threshold Amplitude: 0.5 V
Lead Channel Pacing Threshold Amplitude: 0.5 V
Lead Channel Pacing Threshold Amplitude: 0.625 V
Lead Channel Pacing Threshold Amplitude: 0.875 V
Lead Channel Pacing Threshold Pulse Width: 0.4 ms
Lead Channel Pacing Threshold Pulse Width: 0.4 ms
Lead Channel Sensing Intrinsic Amplitude: 2.8 mV
Lead Channel Setting Pacing Amplitude: 1.75 V
Lead Channel Setting Pacing Amplitude: 2.5 V
Lead Channel Setting Sensing Sensitivity: 4 mV
MDC IDC LEAD LOCATION: 753859
MDC IDC MSMT LEADCHNL RA IMPEDANCE VALUE: 453 Ohm
MDC IDC MSMT LEADCHNL RA PACING THRESHOLD PULSEWIDTH: 0.4 ms
MDC IDC MSMT LEADCHNL RV PACING THRESHOLD PULSEWIDTH: 0.4 ms
MDC IDC SESS DTM: 20170223150118
MDC IDC SET LEADCHNL RV PACING PULSEWIDTH: 0.4 ms
MDC IDC STAT BRADY AP VP PERCENT: 3 %
MDC IDC STAT BRADY AP VS PERCENT: 0 %
MDC IDC STAT BRADY AS VS PERCENT: 0 %

## 2015-04-07 NOTE — Patient Instructions (Addendum)
Medication Instructions:  Your physician recommends that you continue on your current medications as directed. Please refer to the Current Medication list given to you today.   Labwork: None Ordered   Testing/Procedures: None Ordered   Follow-Up: Remote monitoring is used to monitor your Pacemaker from home. This monitoring reduces the number of office visits required to check your device to one time per year. It allows us to keep an eye on the functioning of your device to ensure it is working properly. You are scheduled for a device check from home on 07/07/15. You may send your transmission at any time that day. If you have a wireless device, the transmission will be sent automatically. After your physician reviews your transmission, you will receive a postcard with your next transmission date.  Your physician wants you to follow-up in: 1 year with Dr. Taylor.  You will receive a reminder letter in the mail two months in advance. If you don't receive a letter, please call our office to schedule the follow-up appointment.   If you need a refill on your cardiac medications before your next appointment, please call your pharmacy.   

## 2015-04-07 NOTE — Progress Notes (Signed)
HPI [name] returns today for pacemaker follow-up. He is a very pleasant 30 year old man with a complex past medical history including group B strep infection, resulting in endocarditis and a ventricular septal defect, who underwent surgical repair initially in 2015, complicated by complete heart block. He developed recurrent infection with dehiscent's of his repair and presented in 2016. He underwent revision and postoperatively had atrial fibrillation. He ultimately improved and recovered and has been maintained on a long course of intravenous antibiotic therapy. He denies fevers or chills. He is on oral amoxicillin. He denies palpitations. No peripheral edema. He has increased his physical activity. He is lifting weights.  Allergies  Allergen Reactions  . Cefepime Other (See Comments)    Leukopenia not clear if due to vancomycin vs cefepime  . Vancomycin Other (See Comments)    ? Drug induced leukopenia vs being due to cefepime     Current Outpatient Prescriptions  Medication Sig Dispense Refill  . aspirin EC 325 MG EC tablet Take 1 tablet (325 mg total) by mouth daily. 30 tablet 0  . levofloxacin (LEVAQUIN) 750 MG tablet Take 1 tablet (750 mg total) by mouth daily. 30 tablet 11   No current facility-administered medications for this visit.     Past Medical History  Diagnosis Date  . Obesity   . Smokeless tobacco use   . Fracture of left femur (HCC)   . Meningitis     Group B Strep (November 2015)  . Patent foramen ovale     Closed during procedure on December 30, 2013  . History of open heart surgery     December 30, 2013- ascending aortic root replacement, TEE  . Endocarditis     related to meningitis   . Brain abscess   . Presence of permanent cardiac pacemaker 01/04/2014    PPM MEDTRONIC FOR COMPLETE HEART BLOCK  . Thrombocytopenia (HCC) 12/2013  . AKI (acute kidney injury) (HCC)   . S/P aortic root and valve allograft 12/30/2013    Human allograft aortic root  replacement with repair of aorta-right atrial fistula and tricuspid valve repair  . Tricuspid regurgitation 02/11/2014  . 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 S/P ventricular septal defect repair + redo tricuspid valve repair + redo closure PFO 02/12/2014    Redo sternotomy for bovine pericardial patch repair of large ventricular septal defect (recurrent LVOT to RA fistula due to bacterial endocarditis)  . S/P redo tricuspid valve repair 02/12/2014    Complex valvuloplasty including patch closure of VSD with plication of septal leaflet and 26 mm Edwards mc3 ring annuloplasty  . S/P redo patent foramen ovale closure 02/12/2014  . S/P placement of cardiac pacemaker 01/04/2014    Medtronic (serial number [id number]) dual chamber permanent pacemaker implanted by Dr Ladona Ridgel  . moderate residual aortic insufficiency s/p allograft aortic root replacement 03/08/2014  . Drug-induced leukopenia (HCC) 06/15/2014  . 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   Past Surgical History  Procedure Laterality Date  . Ascending aortic root replacement N/A 12/30/2013    Procedure: ASCENDING AORTIC ROOT REPLACEMENT with 19mm HOMOGRAFT, CLOSURE OF AORTIC TO RIGHT ATRIAL FISTULA, CLOSURE OF PATENT FORAMEN OVALE;  Surgeon: Delight Ovens, MD;  Location: [facility] OR;  Service: Open Heart Surgery;  Laterality: N/A;  . Tricuspid valve replacement N/A 12/30/2013  Procedure: REPAIR OF TRICUSPID VALVE LEAFLET;  Surgeon: Delight Ovens, MD;  Location: [facility] OR;  Service: Open Heart Surgery;  Laterality: N/A;  . Intraoperative transesophageal echocardiogram N/A 12/30/2013    Procedure: INTRAOPERATIVE TRANSESOPHAGEAL ECHOCARDIOGRAM;  Surgeon: Delight Ovens, MD;  Location: [facility] OR;  Service: Open Heart Surgery;  Laterality: N/A;  . Permanent pacemaker insertion N/A 01/04/2014      Procedure: PERMANENT PACEMAKER INSERTION;  Surgeon: Marinus Maw, MD;  Location: [facility] CATH LAB;  Service: Cardiovascular;  Laterality: N/A;  . Insert / replace / remove pacemaker  01/04/2014  . Right heart catheterization N/A 02/12/2014    Procedure: RIGHT HEART CATH;  Surgeon: Laurey Morale, MD;  Location: [facility] CATH LAB;  Service: Cardiovascular;  Laterality: N/A;  . Tee without cardioversion N/A 02/12/2014    Procedure: TRANSESOPHAGEAL ECHOCARDIOGRAM (TEE);  Surgeon: Quintella Reichert, MD;  Location: [facility] ENDOSCOPY;  Service: Cardiovascular;  Laterality: N/A;  . Tricuspid valve replacement N/A 02/12/2014    Procedure: TRICUSPID VALVE REPAIR, REDO MEDIAN STERNOTOMY, REPAIR OF VENTRICULAR SEPTAL DEFECT (RECURRENT LV OUTFLOW TRACT TO RIGHT ATRIAL FISTULA), REDO CLOSURE OF PATENT FORAMEN OVALE;  Surgeon: Purcell Nails, MD;  Location: [facility] OR;  Service: Open Heart Surgery;  Laterality: N/A;  . Tee without cardioversion N/A 04/30/2014    Procedure: TRANSESOPHAGEAL ECHOCARDIOGRAM (TEE);  Surgeon: Jake Bathe, MD;  Location: [facility] ENDOSCOPY;  Service: Cardiovascular;  Laterality: N/A;     Family History  Problem Relation Age of Onset  . Heart murmur Brother      Social History   Social History  . Marital Status: Single    Spouse Name: N/A  . Number of Children: N/A  . Years of Education: N/A   Occupational History  . Not on file.   Social History Main Topics  . Smoking status: Never Smoker   . Smokeless tobacco: Former Neurosurgeon    Quit date: 12/30/2013  . Alcohol Use: 0.0 oz/week    0 Standard drinks or equivalent per week     Comment: rarely  . Drug Use: No  . Sexual Activity: Yes    Birth Control/ Protection: Condom   Other Topics Concern  . Not on file   Social History Narrative     BP 108/84 mmHg  Pulse 84  Ht  (1.651 m)  Wt 236 lb 12.8 oz (107.412 kg)  BMI 39.41 kg/m2  Physical Exam:  Well appearing 30 year old man, NAD HEENT: Unremarkable Neck:  6 cm JVD, no  thyromegally Lymphatics:  No adenopathy Back:  No CVA tenderness Lungs:  Clear, with no wheezes, rales, or rhonchi. HEART:  Regular rate rhythm, no murmurs, no rubs, no clicks Abd:  soft, positive bowel sounds, no organomegally, no rebound, no guarding Ext:  2 plus pulses, no edema, no cyanosis, no clubbing Skin:  No rashes no nodules Neuro:  CN II through XII intact, motor grossly intact   DEVICE  Normal device function.  See PaceArt for details.    Assess/Plan: 1. Complete heart block - he is doing well, s/p PPM insertion 2. PPM - his medtronic device is working normally. Will recheck in several months 3. Bacterial endocarditis - he is s/p surgery and is doing well with no evidence of active infection. He is on suppressive anti-biotics. 4. VSD - he is s/p repair. He is asymptomatic.  Leonia Reeves.D.

## 2015-04-14 ENCOUNTER — Encounter: Payer: Self-pay | Admitting: Cardiology

## 2015-04-14 ENCOUNTER — Telehealth: Payer: Self-pay | Admitting: Cardiology

## 2015-04-15 NOTE — Telephone Encounter (Signed)
Close encounter 

## 2015-04-26 ENCOUNTER — Encounter: Payer: Self-pay | Admitting: Cardiology

## 2015-04-27 ENCOUNTER — Telehealth: Payer: Self-pay | Admitting: Cardiology

## 2015-04-27 DIAGNOSIS — R600 Localized edema: Secondary | ICD-10-CM

## 2015-04-27 NOTE — Telephone Encounter (Signed)
Spoke with sirena, the pt is noticing edema in his feet while working. He is not having any SOB. Per dr Jens Som, pt to have echo and then f/u with pa.

## 2015-04-27 NOTE — Telephone Encounter (Signed)
Please call,she have noticed swelling in his ankles and toes.

## 2015-05-12 ENCOUNTER — Ambulatory Visit (INDEPENDENT_AMBULATORY_CARE_PROVIDER_SITE_OTHER): Payer: BC Managed Care – PPO | Admitting: Infectious Disease

## 2015-05-12 ENCOUNTER — Encounter: Payer: Self-pay | Admitting: Infectious Disease

## 2015-05-12 VITALS — BP 122/70 | HR 83 | Temp 97.6°F | Ht 65.0 in | Wt 231.0 lb

## 2015-05-12 DIAGNOSIS — B951 Streptococcus, group B, as the cause of diseases classified elsewhere: Secondary | ICD-10-CM | POA: Diagnosis not present

## 2015-05-12 DIAGNOSIS — I330 Acute and subacute infective endocarditis: Secondary | ICD-10-CM

## 2015-05-12 DIAGNOSIS — Z9889 Other specified postprocedural states: Secondary | ICD-10-CM

## 2015-05-12 DIAGNOSIS — A401 Sepsis due to streptococcus, group B: Secondary | ICD-10-CM

## 2015-05-12 DIAGNOSIS — R600 Localized edema: Secondary | ICD-10-CM

## 2015-05-12 DIAGNOSIS — T826XXD Infection and inflammatory reaction due to cardiac valve prosthesis, subsequent encounter: Secondary | ICD-10-CM | POA: Diagnosis not present

## 2015-05-12 DIAGNOSIS — A491 Streptococcal infection, unspecified site: Secondary | ICD-10-CM

## 2015-05-12 DIAGNOSIS — D702 Other drug-induced agranulocytosis: Secondary | ICD-10-CM

## 2015-05-12 DIAGNOSIS — R0609 Other forms of dyspnea: Secondary | ICD-10-CM

## 2015-05-12 DIAGNOSIS — I38 Endocarditis, valve unspecified: Secondary | ICD-10-CM

## 2015-05-12 DIAGNOSIS — I358 Other nonrheumatic aortic valve disorders: Secondary | ICD-10-CM

## 2015-05-12 DIAGNOSIS — Z950 Presence of cardiac pacemaker: Secondary | ICD-10-CM

## 2015-05-12 DIAGNOSIS — Z8774 Personal history of (corrected) congenital malformations of heart and circulatory system: Secondary | ICD-10-CM

## 2015-05-12 DIAGNOSIS — I442 Atrioventricular block, complete: Secondary | ICD-10-CM

## 2015-05-12 NOTE — Progress Notes (Signed)
Chief complaint: followup for endocarditis, aortic abscess, brain abscesses due to Group B streptococci  Subjective:    Patient ID: [name], male    DOB: 02/19/1985, 29 y.o.   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 Valve sent to [facility] showed GBS by ribosomal 16S sequencing.   He has had trouble tolerating the high dose ceftriaxone and also completed 2 weeks of gentamicin. He was on Rocephin when I saw him on 02/02/14 and getting used to the abx but a few days later was found to have worsening dyspnea, heart failure change in heart sounds and was admitted and found  To have new Ventricular Septal Defect (recurrent LV outflow tract to right atrial fistula, Severe Tricuspid Regurgitatio, Recurrent Patent Foramen Ovale, and Acute Congestive Heart Failure.   He underwent :   Redo Median Sternotomy  Repair of Ventricular Septal Defect Double layer bovine pericardial patch closure  Tricuspid Valve Repair Complex Valvuloplasty including plication of septal leaflet Edwards mc3 Ring annuloplasty  On January 1st, 2016. Tissue was sent off to [facility] where PCR of Ribosome AGAIN WAS POSITIVE FOR GROUP B STREPTOCOCCUS.      NOTE Pacemaker from first CT surgery had remained in place through 2nd CT surgery and he is Pacer dependent.   IN the interim he was treated withn Rocephin high dose along with 2 weeks of gentamicin which he  finished).  His repeat Echo showed stability of his postoperative repairs.  He was then on oral amoxicillin and without any symptoms. His fiance was resting her head on his chest and discovered a  new murmur.  He was evaluated and had 2d echo showing  mobile vegetation measuring 1.3 x 1.7 cm seemingly attached to the bioprosthetic aortic valve,with mild to moderate AI on 04/29/14.  He was brought into the hospital and underwent TEE which read as showing:  The same large (1.5 x 1.1cm) mobile irregular bordered echodensity along the anterior aspect of the subvalvular apparatus of the bioprosthetic aortic valve in the left ventricular outflow tract consistent with vegetation. There is perivalvular abscess like pocket/space in the posterior region of aortic valve adjacent to intraatrial septum. There was mild regurgitation.  He had blood cultures taken on amoxicillin which were negative and he was broadened ultimately to vancomycin and cefepime.   He was followed closely by Cardiology and CT surgery.   Dr. Tyrone Sage reviewed the TEE and noted that the  Lesion was in the outflow tract of the left ventricle and not on the aortic homograph leaflets. He did not recommend repeat 3rd open heart surgery.  Patient had  been dc to home on IV vancomycin and cefepime.   We found that he then developed leukopenia, vancomycin was stopped, but it did not quickly resolve.  So we stopped cefepime as well. He was out of town so we bridged him with high dose oral levaquin 750mg  daily until he could get back to [facility] and he was switched to IV daptomycin which he has been on through his 7 week of his most recent round of abx therapy. TTE done in mid April  2106 shows that his vegetation has shrunk in size.  We then switched  him to high dose oral levaquin 750mg  daily  Since then repeat TTE done in July and read by Dr. Jens Som and Dr. Tyrone Sage and found to no longer have evidence of vegetation.  Another TTE done in November that did not show any evidence for vegeation  He is on levaquin and doing very well and without symptoms, except for the fact that he had noticed new pedal edema. He is to see  Cardiology tomorrow. He has seen Dr Ladona Ridgel re his PM function and CHB and device functioning well.    Past Medical History  Diagnosis Date  . Obesity   . Smokeless tobacco use   . Fracture of left femur (HCC)   . Meningitis     Group B Strep (November 2015)  . Patent foramen ovale     Closed during procedure on December 30, 2013  . History of open heart surgery     December 30, 2013- ascending aortic root replacement, TEE  . Endocarditis     related to meningitis   . Brain abscess   . Presence of permanent cardiac pacemaker 01/04/2014    PPM MEDTRONIC FOR COMPLETE HEART BLOCK  . Thrombocytopenia (HCC) 12/2013  . AKI (acute kidney injury) (HCC)   . 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 Tricuspid regurgitation 02/11/2014  . 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 S/P ventricular septal defect repair + redo tricuspid valve repair + redo closure PFO 02/12/2014    Redo sternotomy for bovine pericardial patch repair of large ventricular septal defect (recurrent LVOT to RA fistula due to bacterial endocarditis)  . S/P redo tricuspid valve repair 02/12/2014    Complex valvuloplasty including patch closure of VSD with plication of septal leaflet and 26 mm Edwards mc3 ring annuloplasty  . S/P redo patent foramen ovale closure 02/12/2014  . S/P placement of cardiac pacemaker 01/04/2014    Medtronic (serial number [id number]) dual chamber permanent pacemaker implanted by Dr Ladona Ridgel  . moderate residual aortic insufficiency s/p allograft aortic root replacement 03/08/2014  . Drug-induced leukopenia (HCC) 06/15/2014  . 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 Ascending aortic root replacement N/A 12/30/2013    Procedure: ASCENDING AORTIC ROOT  REPLACEMENT with 32mm HOMOGRAFT, CLOSURE OF AORTIC TO RIGHT ATRIAL FISTULA, CLOSURE OF PATENT FORAMEN OVALE;  Surgeon: Delight Ovens, MD;  Location: [facility] OR;  Service: Open Heart Surgery;  Laterality: N/A;  . Tricuspid valve replacement N/A 12/30/2013    Procedure: REPAIR OF TRICUSPID VALVE LEAFLET;  Surgeon: Delight Ovens, MD;  Location: [facility] OR;  Service: Open Heart Surgery;  Laterality: N/A;  . Intraoperative transesophageal echocardiogram N/A 12/30/2013    Procedure: INTRAOPERATIVE TRANSESOPHAGEAL ECHOCARDIOGRAM;  Surgeon: Delight Ovens, MD;  Location: [facility] OR;  Service: Open Heart Surgery;  Laterality: N/A;  . Permanent pacemaker insertion N/A 01/04/2014    Procedure: PERMANENT PACEMAKER INSERTION;  Surgeon: Marinus Maw, MD;  Location: [facility] CATH LAB;  Service: Cardiovascular;  Laterality: N/A;  . Insert / replace / remove pacemaker  01/04/2014  . Right heart catheterization N/A 02/12/2014    Procedure: RIGHT HEART CATH;  Surgeon: Laurey Morale, MD;  Location: [facility] CATH LAB;  Service: Cardiovascular;  Laterality: N/A;  . Tee without cardioversion N/A 02/12/2014  Procedure: TRANSESOPHAGEAL ECHOCARDIOGRAM (TEE);  Surgeon: Quintella Reichert, MD;  Location: [facility] ENDOSCOPY;  Service: Cardiovascular;  Laterality: N/A;  . Tricuspid valve replacement N/A 02/12/2014    Procedure: TRICUSPID VALVE REPAIR, REDO MEDIAN STERNOTOMY, REPAIR OF VENTRICULAR SEPTAL DEFECT (RECURRENT LV OUTFLOW TRACT TO RIGHT ATRIAL FISTULA), REDO CLOSURE OF PATENT FORAMEN OVALE;  Surgeon: Purcell Nails, MD;  Location: [facility] OR;  Service: Open Heart Surgery;  Laterality: N/A;  . Tee without cardioversion N/A 04/30/2014    Procedure: TRANSESOPHAGEAL ECHOCARDIOGRAM (TEE);  Surgeon: Jake Bathe, MD;  Location: [facility] ENDOSCOPY;  Service: Cardiovascular;  Laterality: N/A;    Family History  Problem Relation Age of Onset  . Heart murmur Brother       Social History   Social History  . Marital Status: Single    Spouse Name: N/A  .  Number of Children: N/A  . Years of Education: N/A   Social History Main Topics  . Smoking status: Never Smoker   . Smokeless tobacco: Former Neurosurgeon    Quit date: 12/30/2013  . Alcohol Use: No     Comment: socially  . Drug Use: No  . Sexual Activity: Yes    Birth Control/ Protection: Condom   Other Topics Concern  . None   Social History Narrative    Allergies  Allergen Reactions  . Cefepime Other (See Comments)    Leukopenia not clear if due to vancomycin vs cefepime  . Vancomycin Other (See Comments)    ? Drug induced leukopenia vs being due to cefepime     Current outpatient prescriptions:  .  aspirin EC 325 MG EC tablet, Take 1 tablet (325 mg total) by mouth daily., Disp: 30 tablet, Rfl: 0 .  levofloxacin (LEVAQUIN) 750 MG tablet, Take 1 tablet (750 mg total) by mouth daily., Disp: 30 tablet, Rfl: 11    .Review of Systems  Constitutional: Negative for fever, chills, diaphoresis, activity change and appetite change.  HENT: Negative for congestion, rhinorrhea, sinus pressure, sneezing, sore throat and trouble swallowing.   Eyes: Negative for photophobia and visual disturbance.  Respiratory: Negative for cough, chest tightness, shortness of breath, wheezing and stridor.   Cardiovascular: Positive for leg swelling. Negative for chest pain and palpitations.  Gastrointestinal: Negative for nausea, vomiting, diarrhea, constipation, blood in stool and abdominal distention.  Genitourinary: Negative for dysuria, hematuria, flank pain and difficulty urinating.  Musculoskeletal: Negative for myalgias, back pain, joint swelling, arthralgias and gait problem.  Skin: Negative for color change, pallor, rash and wound.  Neurological: Negative for dizziness, tremors, weakness and light-headedness.  Hematological: Negative for adenopathy. Does not bruise/bleed easily.  Psychiatric/Behavioral: Negative for behavioral problems, confusion, sleep disturbance, dysphoric mood, decreased  concentration and agitation.       Objective:   Physical Exam  Constitutional: He is oriented to person, place, and time. He appears well-developed and well-nourished.  HENT:  Head: Normocephalic and atraumatic.  Eyes: Conjunctivae and EOM are normal.  Neck: Normal range of motion. Neck supple.  Cardiovascular: Regular rhythm.  Bradycardia present.   Murmur heard.  Systolic murmur is present with a grade of 2/6    Pulmonary/Chest: Effort normal and breath sounds normal. No respiratory distress. He has no wheezes. He has no rales. He exhibits no tenderness.  Abdominal: Soft. Bowel sounds are normal. He exhibits no distension. There is no tenderness. There is no rebound.  Musculoskeletal: Normal range of motion. He exhibits no edema or tenderness.  Neurological: He is alert and oriented to person, place,  and time.  Skin: Skin is warm and dry. No rash noted. No erythema. No pallor.  Psychiatric: He has a normal mood and affect. His behavior is normal. Judgment and thought content normal.   PM, Sternotomy clean 07/21/14:             Assessment & Plan:   Group B streptococcal  Aortic valve endocarditis and Aorto-Right Atrial fistula sp AVR, TV repair, repair of Aorto to Right atrial fistula, placement of PM due to 3rd degree heart block, then Ventricular Septal Defect (recurrent LV outflow tract to right atrial fistula, Severe Tricuspid Regurgitatio, Recurrent Patent Foramen Ovale, and Acute Congestive Heart Failure. Sp Redo Median Sternotom, Repair of Ventricular Septal Defect, Double layer bovine pericardial patch closure, Tricuspid Valve Repair, Complex Valvuloplasty including plication of septal leaflet, Edwards mc3 Ring annuloplasty with Group B Streptococcus isolated from heart valve sp repeat IV rocephin x 6 weeks with 2 weeks of gentamicin, changed to oral amoxicillin but now yet again with evidence of large vegetation + possible abscess vs cavity. 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  Continue oral levaquin  daily indefinitely.    I intend to keep him on long term oral antibiotics though I may want to rechallenge him with a beta lactam (ie amoxicillin) and see if he DOES NOT develop leukopenia again (note the cefepime had been DC after his leukopenia failed to promptly resolve with stopping IV vancomycin)  In terms of the source of bacteremia. He now remembers the attempt to get IV access on himself described in my last note. However he states this was attempted years before the  onset of his endocarditis, meningitis.   New pedal edema: to see Cardiology tomorrow.  Drug induced leukopenia: not clear if it was due to vancomycin or cefepime. Would like to challenge with amoxicillin in the future  I spent greater than 25  minutes with the patient including greater than 50% of time in face to face counsel of the patient (re his infected heart valve, potential pm infection potential drug induced leukopenia) pedal edema,  and in coordination of his  care. Marland Kitchen

## 2015-05-13 ENCOUNTER — Ambulatory Visit (HOSPITAL_COMMUNITY): Payer: BC Managed Care – PPO | Attending: Cardiology

## 2015-05-13 ENCOUNTER — Other Ambulatory Visit: Payer: Self-pay

## 2015-05-13 DIAGNOSIS — I351 Nonrheumatic aortic (valve) insufficiency: Secondary | ICD-10-CM | POA: Insufficient documentation

## 2015-05-13 DIAGNOSIS — Z720 Tobacco use: Secondary | ICD-10-CM | POA: Diagnosis not present

## 2015-05-13 DIAGNOSIS — I071 Rheumatic tricuspid insufficiency: Secondary | ICD-10-CM | POA: Diagnosis not present

## 2015-05-13 DIAGNOSIS — Z953 Presence of xenogenic heart valve: Secondary | ICD-10-CM | POA: Diagnosis not present

## 2015-05-13 DIAGNOSIS — R600 Localized edema: Secondary | ICD-10-CM

## 2015-05-13 DIAGNOSIS — I371 Nonrheumatic pulmonary valve insufficiency: Secondary | ICD-10-CM | POA: Diagnosis not present

## 2015-05-13 DIAGNOSIS — E669 Obesity, unspecified: Secondary | ICD-10-CM | POA: Diagnosis not present

## 2015-05-13 DIAGNOSIS — Z6838 Body mass index (BMI) 38.0-38.9, adult: Secondary | ICD-10-CM | POA: Diagnosis not present

## 2015-05-19 ENCOUNTER — Ambulatory Visit: Payer: BC Managed Care – PPO | Admitting: Physician Assistant

## 2015-06-16 NOTE — Progress Notes (Signed)
HPI: FU AVR; initially seen in Nov 2015 with "flu" that ended up being bacterial endocarditis. He underwent aortic root and valve replacement with fistula repair. At that time he also had CHB and had a MDT PTVDP placed. Jan 2016 he had symptoms of dyspnea and was re evaluated. A TEE was done and and revealed a large VSD with left to right shunting from LVOT. On 02/12/2014 he underwent Redo Median Sternotomy, Repair of a VSD, Tricuspid Valve Repair, and Redo closure of PFO.Patient had a repeat echocardiogram on 04/29/2014 that showed mild global reduction in LV function. There was a mobile mass on the aortic valve concerning for vegetation as well as mild to moderate aortic insufficiency. The patient was admitted and IV antibiotics resumed. Blood cultures remained negative. Transesophageal echocardiogram revealed low normal LV function and an aortic valve mass. 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 Patient was evaluated by Dr. Tyrone Sage and medical therapy recommended. Last echocardiogram March 2017 showed ejection fraction 40-45%.There was a bioprosthetic aortic valve with mean gradient 24 mmHg and mild aortic insufficiency There is prior tricuspid valve repair and prior VSD repair with no shunt. Since last seen, Patient denies dyspnea on exertion, orthopnea, PND, chest pain, palpitations or syncope. He had minimal pedal edema some weeks ago but this has resolved.  Current Outpatient Prescriptions  Medication Sig Dispense Refill  . aspirin EC 325 MG EC tablet Take 1 tablet (325 mg total) by mouth daily. 30 tablet 0  . levofloxacin (LEVAQUIN) 750 MG tablet Take 1 tablet (750 mg total) by mouth daily. 30 tablet 11   No current facility-administered medications for this visit.     Past Medical History  Diagnosis Date  . Obesity   . Smokeless tobacco use   . Fracture of left femur (HCC)   . Meningitis     Group B Strep (November 2015)  . Patent foramen ovale      Closed during procedure on December 30, 2013  . History of open heart surgery     December 30, 2013- ascending aortic root replacement, TEE  . Endocarditis     related to meningitis   . Brain abscess   . Presence of permanent cardiac pacemaker 01/04/2014    PPM MEDTRONIC FOR COMPLETE HEART BLOCK  . Thrombocytopenia (HCC) 12/2013  . AKI (acute kidney injury) (HCC)   . 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 Tricuspid regurgitation 02/11/2014  . 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 S/P ventricular septal defect repair + redo tricuspid valve repair + redo closure PFO 02/12/2014    Redo sternotomy for bovine pericardial patch repair of large ventricular septal defect (recurrent LVOT to RA fistula due to bacterial endocarditis)  . S/P redo tricuspid valve repair 02/12/2014    Complex valvuloplasty including patch closure of VSD with plication of septal leaflet and 26 mm Edwards mc3 ring annuloplasty  . S/P redo patent foramen ovale closure 02/12/2014  . S/P placement of cardiac pacemaker 01/04/2014    Medtronic (serial number [id number]) dual chamber permanent pacemaker implanted by Dr Ladona Ridgel  . moderate residual aortic insufficiency s/p allograft aortic root replacement 03/08/2014  . Drug-induced leukopenia (HCC) 06/15/2014  . 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  Ascending aortic root replacement N/A 12/30/2013    Procedure: ASCENDING AORTIC ROOT REPLACEMENT with 23mm HOMOGRAFT, CLOSURE OF AORTIC TO RIGHT ATRIAL FISTULA, CLOSURE OF PATENT FORAMEN OVALE;  Surgeon: Delight Ovens, MD;  Location: [facility] OR;  Service: Open Heart Surgery;  Laterality: N/A;  . Tricuspid valve replacement N/A 12/30/2013    Procedure: REPAIR OF TRICUSPID  VALVE LEAFLET;  Surgeon: Delight Ovens, MD;  Location: [facility] OR;  Service: Open Heart Surgery;  Laterality: N/A;  . Intraoperative transesophageal echocardiogram N/A 12/30/2013    Procedure: INTRAOPERATIVE TRANSESOPHAGEAL ECHOCARDIOGRAM;  Surgeon: Delight Ovens, MD;  Location: [facility] OR;  Service: Open Heart Surgery;  Laterality: N/A;  . Permanent pacemaker insertion N/A 01/04/2014    Procedure: PERMANENT PACEMAKER INSERTION;  Surgeon: Marinus Maw, MD;  Location: [facility] CATH LAB;  Service: Cardiovascular;  Laterality: N/A;  . Insert / replace / remove pacemaker  01/04/2014  . Right heart catheterization N/A 02/12/2014    Procedure: RIGHT HEART CATH;  Surgeon: Laurey Morale, MD;  Location: [facility] CATH LAB;  Service: Cardiovascular;  Laterality: N/A;  . Tee without cardioversion N/A 02/12/2014    Procedure: TRANSESOPHAGEAL ECHOCARDIOGRAM (TEE);  Surgeon: Quintella Reichert, MD;  Location: [facility] ENDOSCOPY;  Service: Cardiovascular;  Laterality: N/A;  . Tricuspid valve replacement N/A 02/12/2014    Procedure: TRICUSPID VALVE REPAIR, REDO MEDIAN STERNOTOMY, REPAIR OF VENTRICULAR SEPTAL DEFECT (RECURRENT LV OUTFLOW TRACT TO RIGHT ATRIAL FISTULA), REDO CLOSURE OF PATENT FORAMEN OVALE;  Surgeon: Purcell Nails, MD;  Location: [facility] OR;  Service: Open Heart Surgery;  Laterality: N/A;  . Tee without cardioversion N/A 04/30/2014    Procedure: TRANSESOPHAGEAL ECHOCARDIOGRAM (TEE);  Surgeon: Jake Bathe, MD;  Location: [facility] ENDOSCOPY;  Service: Cardiovascular;  Laterality: N/A;    Social History   Social History  . Marital Status: Single    Spouse Name: N/A  . Number of Children: N/A  . Years of Education: N/A   Occupational History  . Not on file.   Social History Main Topics  . Smoking status: Never Smoker   . Smokeless tobacco: Former Neurosurgeon    Quit date: 12/30/2013  . Alcohol Use: No     Comment: socially  . Drug Use: No  . Sexual Activity: Yes    Birth Control/ Protection: Condom   Other Topics Concern  .  Not on file   Social History Narrative    Family History  Problem Relation Age of Onset  . Heart murmur Brother     ROS: no fevers or chills, productive cough, hemoptysis, dysphasia, odynophagia, melena, hematochezia, dysuria, hematuria, rash, seizure activity, orthopnea, PND, pedal edema, claudication. Remaining systems are negative.  Physical Exam: Well-developed well-nourished in no acute distress.  Skin is warm and dry.  HEENT is normal.  Neck is supple.  Chest is clear to auscultation with normal expansion.  Cardiovascular exam is regular rate and rhythm. 1/6 systolic murmur. No diastolic murmur. Abdominal exam nontender or distended. No masses palpated. Extremities show no edema. neuro grossly intact  ECG Sinus rhythm with ventricular pacing.

## 2015-06-23 ENCOUNTER — Ambulatory Visit (INDEPENDENT_AMBULATORY_CARE_PROVIDER_SITE_OTHER): Payer: BC Managed Care – PPO | Admitting: Cardiology

## 2015-06-23 ENCOUNTER — Encounter: Payer: Self-pay | Admitting: Cardiology

## 2015-06-23 ENCOUNTER — Ambulatory Visit: Payer: BC Managed Care – PPO | Admitting: Cardiology

## 2015-06-23 VITALS — BP 110/78 | HR 85 | Ht 65.0 in | Wt 234.0 lb

## 2015-06-23 DIAGNOSIS — Z950 Presence of cardiac pacemaker: Secondary | ICD-10-CM | POA: Diagnosis not present

## 2015-06-23 DIAGNOSIS — Z8774 Personal history of (corrected) congenital malformations of heart and circulatory system: Secondary | ICD-10-CM

## 2015-06-23 DIAGNOSIS — I359 Nonrheumatic aortic valve disorder, unspecified: Secondary | ICD-10-CM | POA: Diagnosis not present

## 2015-06-23 DIAGNOSIS — Z9889 Other specified postprocedural states: Secondary | ICD-10-CM

## 2015-06-23 DIAGNOSIS — I420 Dilated cardiomyopathy: Secondary | ICD-10-CM | POA: Diagnosis not present

## 2015-06-23 HISTORY — DX: Dilated cardiomyopathy: I42.0

## 2015-06-23 MED ORDER — METOPROLOL SUCCINATE ER 25 MG PO TB24: 25.0000 mg | ORAL_TABLET | Freq: Every day | ORAL | Status: DC

## 2015-06-23 NOTE — Assessment & Plan Note (Signed)
Management per electrophysiology. 

## 2015-06-23 NOTE — Assessment & Plan Note (Signed)
Ejection fraction 40-45%. Add Toprol 25 mg daily.

## 2015-06-23 NOTE — Assessment & Plan Note (Signed)
Plan continue SBE prophylaxis. He remains on antibiotics per infectious disease.

## 2015-06-23 NOTE — Patient Instructions (Signed)
Medication Instructions:   START METOPROLOL SUCC ER 25 MG ONCE DAILY AT BEDTIME  Follow-Up:  Your physician recommends that you schedule a follow-up appointment in: 3 MONTHS WITH DR Jens Som

## 2015-07-07 ENCOUNTER — Telehealth: Payer: Self-pay | Admitting: Cardiology

## 2015-07-07 ENCOUNTER — Encounter: Payer: BC Managed Care – PPO | Admitting: *Deleted

## 2015-07-07 NOTE — Telephone Encounter (Signed)
LMOVM reminding pt to send remote transmission.   

## 2015-07-08 ENCOUNTER — Encounter: Payer: Self-pay | Admitting: Cardiology

## 2015-10-07 NOTE — Progress Notes (Deleted)
HPI: FU AVR; initially seen in Nov 2015 with "flu" that ended up being bacterial endocarditis. He underwent aortic root and valve replacement with fistula repair. At that time he also had CHB and had a MDT PTVDP placed. Jan 2016 he had symptoms of dyspnea and was re evaluated. A TEE was done and and revealed a large VSD with left to right shunting from LVOT. On 02/12/2014 he underwent Redo Median Sternotomy, Repair of a VSD, Tricuspid Valve Repair, and Redo closure of PFO.Patient had a repeat echocardiogram on 04/29/2014 that showed mild global reduction in LV function. There was a mobile mass on the aortic valve concerning for vegetation as well as mild to moderate aortic insufficiency. The patient was admitted and IV antibiotics resumed. Blood cultures remained negative. Transesophageal echocardiogram revealed low normal LV function and an aortic valve mass. 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 Patient was evaluated by Dr. Tyrone SageGerhardt and medical therapy recommended. Last echocardiogram March 2017 showed ejection fraction 40-45%.There was a bioprosthetic aortic valve with mean gradient 24 mmHg and mild aortic insufficiency There is prior tricuspid valve repair and prior VSD repair with no shunt. Since last seen,   Current Outpatient Prescriptions  Medication Sig Dispense Refill  . aspirin EC 325 MG EC tablet Take 1 tablet (325 mg total) by mouth daily. 30 tablet 0  . levofloxacin (LEVAQUIN) 750 MG tablet Take 1 tablet (750 mg total) by mouth daily. 30 tablet 11  . metoprolol succinate (TOPROL XL) 25 MG 24 hr tablet Take 1 tablet (25 mg total) by mouth daily. 90 tablet 3   No current facility-administered medications for this visit.      Past Medical History:  Diagnosis Date  . AKI (acute kidney injury) (HCC)   . Brain abscess   . Drug-induced leukopenia (HCC) 06/15/2014  . Endocarditis    related to meningitis   . Fracture of left femur (HCC)   .  History of open heart surgery    December 30, 2013- ascending aortic root replacement, TEE  . Meningitis    Group B Strep (November 2015)  . moderate residual aortic insufficiency s/p allograft aortic root replacement 03/08/2014  . Obesity   . Patent foramen ovale    Closed during procedure on December 30, 2013  . Presence of permanent cardiac pacemaker 01/04/2014   PPM MEDTRONIC FOR COMPLETE HEART BLOCK  . 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 S/P aortic root and valve allograft 12/30/2013   Human allograft aortic root replacement with repair of aorta-right atrial fistula and tricuspid valve repair  . S/P placement of cardiac pacemaker 01/04/2014   Medtronic (serial number [id number]) dual chamber permanent pacemaker implanted by Dr Ladona Ridgelaylor  . S/P redo patent foramen ovale closure 02/12/2014  . S/P redo tricuspid valve repair 02/12/2014   Complex valvuloplasty including patch closure of VSD with plication of septal leaflet and 26 mm Edwards mc3 ring annuloplasty  . S/P ventricular septal defect repair + redo tricuspid valve repair + redo closure PFO 02/12/2014   Redo sternotomy for bovine pericardial patch repair of large ventricular septal defect (recurrent LVOT to RA fistula due to bacterial endocarditis)  . Smokeless tobacco use   . Thrombocytopenia (HCC) 12/2013  . Tricuspid regurgitation 02/11/2014  . Ventricular septal defect 02/12/2014   Post-infectious s/p repair of LVOT to RA fistula for bacterial endocarditis    Past Surgical History:  Procedure Laterality Date  . ASCENDING AORTIC ROOT REPLACEMENT N/A 12/30/2013  Procedure: ASCENDING AORTIC ROOT REPLACEMENT with 29mm HOMOGRAFT, CLOSURE OF AORTIC TO RIGHT ATRIAL FISTULA, CLOSURE OF PATENT FORAMEN OVALE;  Surgeon: Delight Ovens, MD;  Location: [facility] OR;  Service: Open Heart Surgery;  Laterality: N/A;  . INSERT / REPLACE / REMOVE PACEMAKER  01/04/2014  . INTRAOPERATIVE TRANSESOPHAGEAL ECHOCARDIOGRAM N/A 12/30/2013    Procedure: INTRAOPERATIVE TRANSESOPHAGEAL ECHOCARDIOGRAM;  Surgeon: Delight Ovens, MD;  Location: [facility] OR;  Service: Open Heart Surgery;  Laterality: N/A;  . PERMANENT PACEMAKER INSERTION N/A 01/04/2014   Procedure: PERMANENT PACEMAKER INSERTION;  Surgeon: Marinus Maw, MD;  Location: [facility] CATH LAB;  Service: Cardiovascular;  Laterality: N/A;  . RIGHT HEART CATHETERIZATION N/A 02/12/2014   Procedure: RIGHT HEART CATH;  Surgeon: Laurey Morale, MD;  Location: [facility] CATH LAB;  Service: Cardiovascular;  Laterality: N/A;  . TEE WITHOUT CARDIOVERSION N/A 02/12/2014   Procedure: TRANSESOPHAGEAL ECHOCARDIOGRAM (TEE);  Surgeon: Quintella Reichert, MD;  Location: [facility] ENDOSCOPY;  Service: Cardiovascular;  Laterality: N/A;  . TEE WITHOUT CARDIOVERSION N/A 04/30/2014   Procedure: TRANSESOPHAGEAL ECHOCARDIOGRAM (TEE);  Surgeon: Jake Bathe, MD;  Location: [facility] ENDOSCOPY;  Service: Cardiovascular;  Laterality: N/A;  . TRICUSPID VALVE REPLACEMENT N/A 12/30/2013   Procedure: REPAIR OF TRICUSPID VALVE LEAFLET;  Surgeon: Delight Ovens, MD;  Location: [facility] OR;  Service: Open Heart Surgery;  Laterality: N/A;  . TRICUSPID VALVE REPLACEMENT N/A 02/12/2014   Procedure: TRICUSPID VALVE REPAIR, REDO MEDIAN STERNOTOMY, REPAIR OF VENTRICULAR SEPTAL DEFECT (RECURRENT LV OUTFLOW TRACT TO RIGHT ATRIAL FISTULA), REDO CLOSURE OF PATENT FORAMEN OVALE;  Surgeon: Purcell Nails, MD;  Location: [facility] OR;  Service: Open Heart Surgery;  Laterality: N/A;    Social History   Social History  . Marital status: Single    Spouse name: N/A  . Number of children: N/A  . Years of education: N/A   Occupational History  . Not on file.   Social History Main Topics  . Smoking status: Never Smoker  . Smokeless tobacco: Former Neurosurgeon    Quit date: 12/30/2013  . Alcohol use No     Comment: socially  . Drug use: No  . Sexual activity: Yes    Birth control/ protection: Condom   Other Topics Concern  . Not on file   Social History Narrative  . No  narrative on file    Family History  Problem Relation Age of Onset  . Heart murmur Brother     ROS: no fevers or chills, productive cough, hemoptysis, dysphasia, odynophagia, melena, hematochezia, dysuria, hematuria, rash, seizure activity, orthopnea, PND, pedal edema, claudication. Remaining systems are negative.  Physical Exam: Well-developed well-nourished in no acute distress.  Skin is warm and dry.  HEENT is normal.  Neck is supple.  Chest is clear to auscultation with normal expansion.  Cardiovascular exam is regular rate and rhythm.  Abdominal exam nontender or distended. No masses palpated. Extremities show no edema. neuro grossly intact  ECG

## 2015-10-10 ENCOUNTER — Ambulatory Visit: Payer: BC Managed Care – PPO | Admitting: Cardiology

## 2015-10-11 ENCOUNTER — Encounter: Payer: Self-pay | Admitting: *Deleted

## 2015-11-27 ENCOUNTER — Other Ambulatory Visit: Payer: Self-pay | Admitting: Infectious Disease

## 2015-11-27 DIAGNOSIS — I330 Acute and subacute infective endocarditis: Secondary | ICD-10-CM

## 2015-12-07 ENCOUNTER — Encounter: Payer: Self-pay | Admitting: Infectious Disease

## 2015-12-07 ENCOUNTER — Ambulatory Visit (INDEPENDENT_AMBULATORY_CARE_PROVIDER_SITE_OTHER): Payer: BC Managed Care – PPO | Admitting: Infectious Disease

## 2015-12-07 VITALS — BP 119/72 | HR 74 | Temp 97.6°F | Wt 238.0 lb

## 2015-12-07 DIAGNOSIS — T826XXD Infection and inflammatory reaction due to cardiac valve prosthesis, subsequent encounter: Secondary | ICD-10-CM | POA: Diagnosis not present

## 2015-12-07 DIAGNOSIS — Z23 Encounter for immunization: Secondary | ICD-10-CM | POA: Diagnosis not present

## 2015-12-07 DIAGNOSIS — Z954 Presence of other heart-valve replacement: Secondary | ICD-10-CM

## 2015-12-07 DIAGNOSIS — I442 Atrioventricular block, complete: Secondary | ICD-10-CM | POA: Diagnosis not present

## 2015-12-07 DIAGNOSIS — D702 Other drug-induced agranulocytosis: Secondary | ICD-10-CM

## 2015-12-07 DIAGNOSIS — A401 Sepsis due to streptococcus, group B: Secondary | ICD-10-CM | POA: Diagnosis not present

## 2015-12-07 DIAGNOSIS — I38 Endocarditis, valve unspecified: Principal | ICD-10-CM

## 2015-12-07 DIAGNOSIS — A491 Streptococcal infection, unspecified site: Secondary | ICD-10-CM | POA: Diagnosis not present

## 2015-12-07 DIAGNOSIS — Z950 Presence of cardiac pacemaker: Secondary | ICD-10-CM

## 2015-12-07 DIAGNOSIS — I330 Acute and subacute infective endocarditis: Secondary | ICD-10-CM

## 2015-12-07 DIAGNOSIS — Z9889 Other specified postprocedural states: Secondary | ICD-10-CM

## 2015-12-07 DIAGNOSIS — Z8774 Personal history of (corrected) congenital malformations of heart and circulatory system: Secondary | ICD-10-CM

## 2015-12-07 LAB — CBC WITH DIFFERENTIAL/PLATELET
BASOS PCT: 1 %
Basophils Absolute: 83 cells/uL (ref 0–200)
EOS ABS: 415 {cells}/uL (ref 15–500)
Eosinophils Relative: 5 %
HEMATOCRIT: 43.7 % (ref 38.5–50.0)
HEMOGLOBIN: 15 g/dL (ref 13.2–17.1)
LYMPHS ABS: 1909 {cells}/uL (ref 850–3900)
Lymphocytes Relative: 23 %
MCH: 29.4 pg (ref 27.0–33.0)
MCHC: 34.3 g/dL (ref 32.0–36.0)
MCV: 85.5 fL (ref 80.0–100.0)
MONO ABS: 913 {cells}/uL (ref 200–950)
MPV: 10.5 fL (ref 7.5–12.5)
Monocytes Relative: 11 %
NEUTROS PCT: 60 %
Neutro Abs: 4980 cells/uL (ref 1500–7800)
Platelets: 226 10*3/uL (ref 140–400)
RBC: 5.11 MIL/uL (ref 4.20–5.80)
RDW: 14.2 % (ref 11.0–15.0)
WBC: 8.3 10*3/uL (ref 3.8–10.8)

## 2015-12-07 LAB — BASIC METABOLIC PANEL WITH GFR
BUN: 25 mg/dL (ref 7–25)
CHLORIDE: 105 mmol/L (ref 98–110)
CO2: 25 mmol/L (ref 20–31)
Calcium: 9.3 mg/dL (ref 8.6–10.3)
Creat: 1.29 mg/dL (ref 0.60–1.35)
GFR, Est African American: 87 mL/min (ref >=60)
GFR, Est Non African American: 75 mL/min (ref >=60)
Glucose, Bld: 85 mg/dL (ref 65–99)
POTASSIUM: 4.4 mmol/L (ref 3.5–5.3)
Sodium: 140 mmol/L (ref 135–146)

## 2015-12-07 NOTE — Progress Notes (Signed)
Chief complaint: followup for endocarditis, aortic abscess, brain abscesses due to Group B streptococci  Subjective:    Patient ID: [name], male    DOB: March 19, 1985, 30 y.o.   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 Valve sent to [facility] showed GBS by ribosomal 16S sequencing.   He has had trouble tolerating the high dose ceftriaxone and also completed 2 weeks of gentamicin. He was on Rocephin when I saw him on 02/02/14 and getting used to the abx but a few days later was found to have worsening dyspnea, heart failure change in heart sounds and was admitted and found  To have new Ventricular Septal Defect (recurrent LV outflow tract to right atrial fistula, Severe Tricuspid Regurgitatio, Recurrent Patent Foramen Ovale, and Acute Congestive Heart Failure.   He underwent :   Redo Median Sternotomy  Repair of Ventricular Septal Defect Double layer bovine pericardial patch closure  Tricuspid Valve Repair Complex Valvuloplasty including plication of septal leaflet Edwards mc3 Ring annuloplasty  On January 1st, 2016. Tissue was sent off to [facility] where PCR of Ribosome AGAIN WAS POSITIVE FOR GROUP B STREPTOCOCCUS.      NOTE Pacemaker from first CT surgery had remained in place through 2nd CT surgery and he is Pacer dependent.   IN the interim he was treated withn Rocephin high dose along with 2 weeks of gentamicin which he  finished).  His repeat Echo showed stability of his postoperative repairs.  He was then on oral amoxicillin and without any symptoms. His fiance was resting her head on his chest and discovered a  new murmur.  He was evaluated and had 2d echo showing  mobile vegetation measuring 1.3 x 1.7 cm seemingly attached to the bioprosthetic aortic valve,with mild to moderate AI on 04/29/14.  He was brought into the hospital and underwent TEE which read as showing:  The same large (1.5 x 1.1cm) mobile irregular bordered echodensity along the anterior aspect of the subvalvular apparatus of the bioprosthetic aortic valve in the left ventricular outflow tract consistent with vegetation. There is perivalvular abscess like pocket/space in the posterior region of aortic valve adjacent to intraatrial septum. There was mild regurgitation.  He had blood cultures taken on amoxicillin which were negative and he was broadened ultimately to vancomycin and cefepime.   He was followed closely by Cardiology and CT surgery.   Dr. Tyrone Sage reviewed the TEE and noted that the  Lesion was in the outflow tract of the left ventricle and not on the aortic homograph leaflets. He did not recommend repeat 3rd open heart surgery.  Patient had  been dc to home on IV vancomycin and cefepime.   We found that he then developed leukopenia, vancomycin was stopped, but it did not quickly resolve.  So we stopped cefepime as well. He was out of town so we bridged him with high dose oral levaquin 750mg  daily until he could get back to [facility] and he was switched to IV daptomycin which he has been on through his 7 week of his most recent round of abx therapy. TTE done in mid April  2106 shows that his vegetation has shrunk in size.  We then switched him  to high dose oral levaquin 750mg  daily  Since then repeat TTE done in July and read by Dr. Jens Som and Dr. Tyrone Sage and found to no longer have evidence of vegetation.  Another TTE done in November that did not show any evidence for vegeation  He is on levaquin and doing very well and without symptoms. He has been put on metoprolol for beta blockade   He is doing well  and without complaints. 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   Past Medical History:  Diagnosis Date  . AKI (acute kidney injury) (HCC)   . Brain abscess   . Drug-induced leukopenia (HCC) 06/15/2014  . Endocarditis    related to meningitis   . Fracture of left femur (HCC)   . History of open heart surgery    December 30, 2013- ascending aortic root replacement, TEE  . Meningitis    Group B Strep (November 2015)  . moderate residual aortic insufficiency s/p allograft aortic root replacement 03/08/2014  . Obesity   . Patent foramen ovale    Closed during procedure on December 30, 2013  . Presence of permanent cardiac pacemaker 01/04/2014   PPM MEDTRONIC FOR COMPLETE HEART BLOCK  . 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 S/P aortic root and valve allograft 12/30/2013   Human allograft aortic root replacement with repair of aorta-right atrial fistula and tricuspid valve repair  . S/P placement of cardiac pacemaker 01/04/2014   Medtr[employer] (serial number [id number]) dual chamber permanent pacemaker implanted by Dr Ladona Ridgel  . S/P redo patent foramen ovale closure 02/12/2014  . S/P redo tricuspid valve repair 02/12/2014   Complex valvuloplasty including patch closure of VSD with plication of septal leaflet and 26 mm Edwards mc3 ring annuloplasty  . S/P ventricular septal defect repair + redo tricuspid valve repair + redo closure PFO 02/12/2014   Redo sternotomy for bovine pericardial patch repair of large ventricular septal defect (recurrent LVOT to RA fistula due to bacterial endocarditis)  . Smokeless tobacco use   . Thrombocytopenia (HCC) 12/2013  . Tricuspid regurgitation 02/11/2014  . Ventricular septal defect 02/12/2014   Post-infectious s/p repair of LVOT to RA fistula for bacterial endocarditis    Past Surgical History:  Procedure Laterality Date  . ASCENDING AORTIC ROOT REPLACEMENT N/A 12/30/2013   Procedure: ASCENDING AORTIC ROOT REPLACEMENT  with 23mm HOMOGRAFT, CLOSURE OF AORTIC TO RIGHT ATRIAL FISTULA, CLOSURE OF PATENT FORAMEN OVALE;  Surgeon: Delight Ovens, MD;  Location: [facility] OR;  Service: Open Heart Surgery;  Laterality: N/A;  . INSERT / REPLACE / REMOVE PACEMAKER  01/04/2014  . INTRAOPERATIVE TRANSESOPHAGEAL ECHOCARDIOGRAM N/A 12/30/2013   Procedure: INTRAOPERATIVE TRANSESOPHAGEAL ECHOCARDIOGRAM;  Surgeon: Delight Ovens, MD;  Location: [facility] OR;  Service: Open Heart Surgery;  Laterality: N/A;  . PERMANENT PACEMAKER INSERTION N/A 01/04/2014   Procedure: PERMANENT PACEMAKER INSERTION;  Surgeon: Marinus Maw, MD;  Location: [facility] CATH LAB;  Service: Cardiovascular;  Laterality: N/A;  . RIGHT HEART CATHETERIZATION N/A 02/12/2014   Procedure: RIGHT HEART CATH;  Surgeon: Laurey Morale, MD;  Location: [facility] CATH LAB;  Service: Cardiovascular;  Laterality: N/A;  . TEE WITHOUT CARDIOVERSION N/A 02/12/2014   Procedure: TRANSESOPHAGEAL ECHOCARDIOGRAM (TEE);  Surgeon: Quintella Reichert, MD;  Location: [facility] ENDOSCOPY;  Service: Cardiovascular;  Laterality: N/A;  . TEE WITHOUT CARDIOVERSION N/A 04/30/2014   Procedure: TRANSESOPHAGEAL ECHOCARDIOGRAM (TEE);  Surgeon: Jake Bathe, MD;  Location: [facility] ENDOSCOPY;  Service: Cardiovascular;  Laterality:  N/A;  . TRICUSPID VALVE REPLACEMENT N/A 12/30/2013   Procedure: REPAIR OF TRICUSPID VALVE LEAFLET;  Surgeon: Delight Ovens, MD;  Location: [facility] OR;  Service: Open Heart Surgery;  Laterality: N/A;  . TRICUSPID VALVE REPLACEMENT N/A 02/12/2014   Procedure: TRICUSPID VALVE REPAIR, REDO MEDIAN STERNOTOMY, REPAIR OF VENTRICULAR SEPTAL DEFECT (RECURRENT LV OUTFLOW TRACT TO RIGHT ATRIAL FISTULA), REDO CLOSURE OF PATENT FORAMEN OVALE;  Surgeon: Purcell Nails, MD;  Location: [facility] OR;  Service: Open Heart Surgery;  Laterality: N/A;    Family History  Problem Relation Age of Onset  . Heart murmur Brother       Social History   Social History  . Marital status: Single    Spouse name: N/A  . Number of children:  N/A  . Years of education: N/A   Social History Main Topics  . Smoking status: Never Smoker  . Smokeless tobacco: Former Neurosurgeon    Quit date: 12/30/2013  . Alcohol use No     Comment: socially  . Drug use: No  . Sexual activity: Yes    Birth control/ protection: Condom   Other Topics Concern  . None   Social History Narrative  . None    Allergies  Allergen Reactions  . Cefepime Other (See Comments)    Leukopenia not clear if due to vancomycin vs cefepime  . Vancomycin Other (See Comments)    ? Drug induced leukopenia vs being due to cefepime     Current Outpatient Prescriptions:  .  aspirin EC 325 MG EC tablet, Take 1 tablet (325 mg total) by mouth daily., Disp: 30 tablet, Rfl: 0 .  levofloxacin (LEVAQUIN) 750 MG tablet, TAKE 1 TABLET (750 MG TOTAL) BY MOUTH DAILY., Disp: 30 tablet, Rfl: 1 .  metoprolol succinate (TOPROL XL) 25 MG 24 hr tablet, Take 1 tablet (25 mg total) by mouth daily., Disp: 90 tablet, Rfl: 3    .Review of Systems  Constitutional: Negative for activity change, appetite change, chills, diaphoresis and fever.  HENT: Negative for congestion, rhinorrhea, sinus pressure, sneezing, sore throat and trouble swallowing.   Eyes: Negative for photophobia and visual disturbance.  Respiratory: Negative for cough, chest tightness, shortness of breath, wheezing and stridor.   Cardiovascular: Negative for chest pain, palpitations and leg swelling.  Gastrointestinal: Negative for abdominal distention, blood in stool, constipation, diarrhea, nausea and vomiting.  Genitourinary: Negative for difficulty urinating, dysuria, flank pain and hematuria.  Musculoskeletal: Negative for arthralgias, back pain, gait problem, joint swelling and myalgias.  Skin: Negative for color change, pallor, rash and wound.  Neurological: Negative for dizziness, tremors, weakness and light-headedness.  Hematological: Negative for adenopathy. Does not bruise/bleed easily.    Psychiatric/Behavioral: Negative for agitation, behavioral problems, confusion, decreased concentration, dysphoric mood and sleep disturbance.       Objective:   Physical Exam  Constitutional: He is oriented to person, place, and time. He appears well-developed and well-nourished.  HENT:  Head: Normocephalic and atraumatic.  Eyes: Conjunctivae and EOM are normal.  Neck: Normal range of motion. Neck supple.  Cardiovascular: Regular rhythm.  Bradycardia present.   Murmur heard.  Systolic murmur is present with a grade of 2/6    Pulmonary/Chest: Effort normal and breath sounds normal. No respiratory distress. He has no wheezes. He has no rales. He exhibits no tenderness.  Abdominal: Soft. Bowel sounds are normal. He exhibits no distension. There is no tenderness. There is no rebound.  Musculoskeletal: Normal range of motion. He exhibits no edema or tenderness.  Neurological: He is alert and oriented to person, place, and time.  Skin: Skin is warm and dry. No rash noted. No erythema. No pallor.  Psychiatric: He has a normal mood and affect. His behavior is normal. Judgment and thought content normal.   PM, Sternotomy clean 07/21/14:             Assessment & Plan:   Group B streptococcal  Aortic valve endocarditis and Aorto-Right Atrial fistula sp AVR, TV repair, repair of Aorto to Right atrial fistula, placement of PM due to 3rd degree heart block, then Ventricular Septal Defect (recurrent LV outflow tract to right atrial fistula, Severe Tricuspid Regurgitatio, Recurrent Patent Foramen Ovale, and Acute Congestive Heart Failure. Sp Redo Median Sternotom, Repair of Ventricular Septal Defect, Double layer bovine pericardial patch closure, Tricuspid Valve Repair, Complex Valvuloplasty including plication of septal leaflet, Edwards mc3 Ring annuloplasty with Group B Streptococcus isolated from heart valve sp repeat IV rocephin x 6 weeks with 2 weeks of gentamicin, changed to oral  amoxicillin but now yet again with evidence of large vegetation + possible abscess vs cavity. 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  Continue oral levaquin 750mg  daily indefinitely.    I intend to keep him on long term oral antibiotics though I may want to rechallenge him with a beta lactam (ie amoxicillin) and see if he DOES NOT develop leukopenia again (note the cefepime had been DC after his leukopenia failed to promptly resolve with stopping IV vancomycin)  Wart on his hand: he is wondering why this came back but went away on IV antibiotics I don't think there is a correlation.  I spent greater than 25  minutes with the patient including greater than 50% of time in face to face counsel of the patient (re his infected heart valve, potential pm infection potential drug induced leukopenia)  and in coordination of his  care. .Marland Kitchen

## 2015-12-08 LAB — SEDIMENTATION RATE: SED RATE: 1 mm/h (ref 0–15)

## 2015-12-08 LAB — C-REACTIVE PROTEIN: CRP: 6.7 mg/L (ref <8.0)

## 2016-01-28 ENCOUNTER — Other Ambulatory Visit: Payer: Self-pay | Admitting: Infectious Disease

## 2016-01-28 DIAGNOSIS — I330 Acute and subacute infective endocarditis: Secondary | ICD-10-CM

## 2016-03-21 ENCOUNTER — Telehealth: Payer: Self-pay | Admitting: *Deleted

## 2016-03-21 NOTE — Telephone Encounter (Signed)
Patient's wife called wanted Dr Daiva Eves to know that he woke up with hives today. He was seen by PCP and put on steroids.

## 2016-03-21 NOTE — Telephone Encounter (Signed)
I assume there is concern it is due to his antibiotic?  I while I do not want to stop his antibiotics if he is having significant hives he may indeed need to stop his antibiotics.  If he does this he needs to be brought into clinic to be seen by the clinic Dr.

## 2016-03-22 ENCOUNTER — Emergency Department (HOSPITAL_BASED_OUTPATIENT_CLINIC_OR_DEPARTMENT_OTHER)
Admission: EM | Admit: 2016-03-22 | Discharge: 2016-03-22 | Disposition: A | Discharge status: Home / Self Care | Payer: BC Managed Care – PPO | Attending: Emergency Medicine | Admitting: Emergency Medicine

## 2016-03-22 ENCOUNTER — Encounter (HOSPITAL_BASED_OUTPATIENT_CLINIC_OR_DEPARTMENT_OTHER): Payer: Self-pay | Admitting: *Deleted

## 2016-03-22 DIAGNOSIS — R1110 Vomiting, unspecified: Secondary | ICD-10-CM | POA: Insufficient documentation

## 2016-03-22 DIAGNOSIS — Z87891 Personal history of nicotine dependence: Secondary | ICD-10-CM | POA: Insufficient documentation

## 2016-03-22 DIAGNOSIS — L509 Urticaria, unspecified: Secondary | ICD-10-CM | POA: Diagnosis present

## 2016-03-22 MED ORDER — DIPHENHYDRAMINE HCL 25 MG PO CAPS
50.0000 mg | ORAL_CAPSULE | Freq: Once | ORAL | Status: AC
  Administered 2016-03-22: 50 mg via ORAL
  Filled 2016-03-22: qty 2

## 2016-03-22 MED ORDER — FAMOTIDINE 20 MG PO TABS: 20.0000 mg | ORAL_TABLET | Freq: Two times a day (BID) | ORAL | 0 refills | Status: DC

## 2016-03-22 MED ORDER — FAMOTIDINE 20 MG PO TABS
20.0000 mg | ORAL_TABLET | Freq: Once | ORAL | Status: AC
  Administered 2016-03-22: 20 mg via ORAL
  Filled 2016-03-22: qty 1

## 2016-03-22 MED ORDER — ONDANSETRON 4 MG PO TBDP
4.0000 mg | ORAL_TABLET | Freq: Once | ORAL | Status: AC
  Administered 2016-03-22: 4 mg via ORAL
  Filled 2016-03-22: qty 1

## 2016-03-22 MED FILL — FAMOTIDINE 20 MG TABLET: 20 | 15 days supply | Qty: 30 | Fill #0

## 2016-03-22 NOTE — Telephone Encounter (Signed)
Ok very good.

## 2016-03-22 NOTE — ED Provider Notes (Signed)
Medical screening examination/treatment/procedure(s) were conducted as a shared visit with non-physician practitioner(s) and myself.  I personally evaluated the patient during the encounter.   EKG Interpretation None      Results for orders placed or performed in visit on 12/07/15  C-reactive protein  Result Value Ref Range   CRP 6.7 <8.0 mg/L  Sedimentation rate  Result Value Ref Range   Sed Rate 1 0 - 15 mm/hr  BASIC METABOLIC PANEL WITH GFR  Result Value Ref Range   Sodium 140 135 - 146 mmol/L   Potassium 4.4 3.5 - 5.3 mmol/L   Chloride 105 98 - 110 mmol/L   CO2 25 20 - 31 mmol/L   Glucose, Bld 85 65 - 99 mg/dL   BUN 25 7 - 25 mg/dL   Creat 1.29 0.60 - 1.35 mg/dL   Calcium 9.3 8.6 - 10.3 mg/dL   GFR, Est African American 87 >=60 mL/min   GFR, Est Non African American 75 >=60 mL/min  CBC with Differential/Platelet  Result Value Ref Range   WBC 8.3 3.8 - 10.8 K/uL   RBC 5.11 4.20 - 5.80 MIL/uL   Hemoglobin 15.0 13.2 - 17.1 g/dL   HCT 43.7 38.5 - 50.0 %   MCV 85.5 80.0 - 100.0 fL   MCH 29.4 27.0 - 33.0 pg   MCHC 34.3 32.0 - 36.0 g/dL   RDW 14.2 11.0 - 15.0 %   Platelets 226 140 - 400 K/uL   MPV 10.5 7.5 - 12.5 fL   Neutro Abs 4,980 1,500 - 7,800 cells/uL   Lymphs Abs 1,909 850 - 3,900 cells/uL   Monocytes Absolute 913 200 - 950 cells/uL   Eosinophils Absolute 415 15 - 500 cells/uL   Basophils Absolute 83 0 - 200 cells/uL   Neutrophils Relative % 60 %   Lymphocytes Relative 23 %   Monocytes Relative 11 %   Eosinophils Relative 5 %   Basophils Relative 1 %   Smear Review Criteria for review not met     Patient seen by me along with the physician assistant. Patient's had a history of hives for 3 days. Seen by primary care Dr. started on prednisone and Benadryl. Primary care doctor contacted his infectious disease doctor Dr. Drucilla Schmidt recommended trying to prednisone and keeping him on Levaquin. Patient has a past medical history that is complicated including a brain  abscess endocarditis. And meningitis. These are all in the past and patient as been doing well but has been continued on the Levaquin.  Patient with extensive hives some itching but not extensive itching. Hives or on the trunk as well as arms and upper lower extremities. No lip swelling no tongue swelling. No trouble breathing. Patient did have one episode of vomiting today.  Patient nontoxic no acute distress. Rash seems to be consistent with hives doesn't seem to be consistent with an erythema multiforme is no skin peeling. Also does not seem to be consistent with pityriasis rosea. Patient is not aware of anything new that could've caused this. Patient without a history of hives in the past.  Recommended contacting infectious disease to see if they wanted to stop the Levaquin they recommend stopping it for a week. We'll also add on Pepcid to his regimen of Benadryl and prednisone.  Also recommended dermatology follow-up.   Fredia Sorrow, MD 03/22/16 [phone]

## 2016-03-22 NOTE — ED Triage Notes (Signed)
Hives x 3 days. He was seen by his MD and started on Prednisone. He has been taking Benadryl. Chills. No respiratory distress.

## 2016-03-22 NOTE — Telephone Encounter (Signed)
Spoke to patient's wife and he has had some improvement with the itching, however just started yesterday. He will call if the hives do not subside. Wendall Mola CMA

## 2016-03-22 NOTE — ED Notes (Signed)
ED Provider at bedside. 

## 2016-03-22 NOTE — ED Provider Notes (Signed)
[facility] Provider Note   CSN: [id number] Arrival date & time: 03/22/16  1046     History   Chief Complaint Chief Complaint  Patient presents with  . Urticaria    HPI [name] is a 31 y.o. male.  Patient with past medical history remarkable for meningitis, endocarditis, valve replacement, and pacemaker currently receiving prophylactic Levaquin for the past 2 years presents to the emergency department with chief complaint of hives. He states that he noticed a small rash on his left wrist 2 days ago. The rash has spread to his entire body. He saw his primary care doctor and was put on prednisone. He has also been taking Benadryl. He denies any improvement. He denies any shortness of breath, or difficulty swallowing. He states that immediately prior to arrival he did have one episode of vomiting. He has been afebrile.   The history is provided by the patient. No language interpreter was used.    Past Medical History:  Diagnosis Date  . AKI (acute kidney injury) (HCC)   . Brain abscess   . Drug-induced leukopenia (HCC) 06/15/2014  . Endocarditis    related to meningitis   . Fracture of left femur (HCC)   . History of open heart surgery    December 30, 2013- ascending aortic root replacement, TEE  . Meningitis    Group B Strep (November 2015)  . moderate residual aortic insufficiency s/p allograft aortic root replacement 03/08/2014  . Obesity   . Patent foramen ovale    Closed during procedure on December 30, 2013  . Presence of permanent cardiac pacemaker 01/04/2014   PPM MEDTRONIC FOR COMPLETE HEART BLOCK  . 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 S/P aortic root and valve allograft 12/30/2013   Human allograft aortic root replacement with repair of aorta-right atrial fistula and tricuspid valve repair  . S/P placement of cardiac pacemaker 01/04/2014   Medtronic (serial number [id number]) dual chamber permanent pacemaker implanted by Dr Ladona Ridgel  . S/P  redo patent foramen ovale closure 02/12/2014  . S/P redo tricuspid valve repair 02/12/2014   Complex valvuloplasty including patch closure of VSD with plication of septal leaflet and 26 mm Edwards mc3 ring annuloplasty  . S/P ventricular septal defect repair + redo tricuspid valve repair + redo closure PFO 02/12/2014   Redo sternotomy for bovine pericardial patch repair of large ventricular septal defect (recurrent LVOT to RA fistula due to bacterial endocarditis)  . Smokeless tobacco use   . Thrombocytopenia (HCC) 12/2013  . Tricuspid regurgitation 02/11/2014  . Ventricular septal defect 02/12/2014   Post-infectious s/p repair of LVOT to RA fistula for bacterial endocarditis    Patient Active Problem List   Diagnosis Date Noted  . Congestive dilated cardiomyopathy (HCC) 06/23/2015  . Pedal edema 05/12/2015  . 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 Drug-induced leukopenia (HCC) 06/15/2014  . Endocarditis   . Aortic valve vegetation on echo 04/29/14 04/29/2014  . Lower back pain 03/10/2014  . residual aortic insufficiency s/p allograft aortic root replacement 03/08/2014  . Group B streptococcal infection   . S/P ventricular septal defect repair + redo tricuspid valve repair + redo closure PFO 02/12/2014  . S/P redo tricuspid valve repair 02/12/2014  . S/P redo patent foramen ovale closure 02/12/2014  . Dyspnea   . Pacemaker- MDT Nov 2015 02/11/2014  . Dyspnea on exertion 02/11/2014  . S/P placement of cardiac pacemaker 01/04/2014  . Third degree heart block (HCC) 12/31/2013  . Endocarditis of aortic  valve-November 2015   . S/P aortic root and valve allograft Nov 2015with re do surgery Jan 1st 2016 12/30/2013  . Bacterial endocarditis   . Bilateral edema of lower extremity   . Sepsis due to group B Streptococcus (HCC)   . LFT elevation   . Headache   . Pain in joint, lower leg   . Dehydration     Past Surgical History:  Procedure Laterality Date  . ASCENDING AORTIC ROOT  REPLACEMENT N/A 12/30/2013   Procedure: ASCENDING AORTIC ROOT REPLACEMENT with 6mm HOMOGRAFT, CLOSURE OF AORTIC TO RIGHT ATRIAL FISTULA, CLOSURE OF PATENT FORAMEN OVALE;  Surgeon: Delight Ovens, MD;  Location: [facility] OR;  Service: Open Heart Surgery;  Laterality: N/A;  . INSERT / REPLACE / REMOVE PACEMAKER  01/04/2014  . INTRAOPERATIVE TRANSESOPHAGEAL ECHOCARDIOGRAM N/A 12/30/2013   Procedure: INTRAOPERATIVE TRANSESOPHAGEAL ECHOCARDIOGRAM;  Surgeon: Delight Ovens, MD;  Location: [facility] OR;  Service: Open Heart Surgery;  Laterality: N/A;  . PERMANENT PACEMAKER INSERTION N/A 01/04/2014   Procedure: PERMANENT PACEMAKER INSERTION;  Surgeon: Marinus Maw, MD;  Location: [facility] CATH LAB;  Service: Cardiovascular;  Laterality: N/A;  . RIGHT HEART CATHETERIZATION N/A 02/12/2014   Procedure: RIGHT HEART CATH;  Surgeon: Laurey Morale, MD;  Location: [facility] CATH LAB;  Service: Cardiovascular;  Laterality: N/A;  . TEE WITHOUT CARDIOVERSION N/A 02/12/2014   Procedure: TRANSESOPHAGEAL ECHOCARDIOGRAM (TEE);  Surgeon: Quintella Reichert, MD;  Location: [facility] ENDOSCOPY;  Service: Cardiovascular;  Laterality: N/A;  . TEE WITHOUT CARDIOVERSION N/A 04/30/2014   Procedure: TRANSESOPHAGEAL ECHOCARDIOGRAM (TEE);  Surgeon: Jake Bathe, MD;  Location: [facility] ENDOSCOPY;  Service: Cardiovascular;  Laterality: N/A;  . TRICUSPID VALVE REPLACEMENT N/A 12/30/2013   Procedure: REPAIR OF TRICUSPID VALVE LEAFLET;  Surgeon: Delight Ovens, MD;  Location: [facility] OR;  Service: Open Heart Surgery;  Laterality: N/A;  . TRICUSPID VALVE REPLACEMENT N/A 02/12/2014   Procedure: TRICUSPID VALVE REPAIR, REDO MEDIAN STERNOTOMY, REPAIR OF VENTRICULAR SEPTAL DEFECT (RECURRENT LV OUTFLOW TRACT TO RIGHT ATRIAL FISTULA), REDO CLOSURE OF PATENT FORAMEN OVALE;  Surgeon: Purcell Nails, MD;  Location: [facility] OR;  Service: Open Heart Surgery;  Laterality: N/A;       Home Medications    Prior to Admission medications   Medication Sig Start Date End Date Taking?  Authorizing Provider  aspirin EC 325 MG EC tablet Take 1 tablet (325 mg total) by mouth daily. 01/06/14  Yes Donielle Margaretann Loveless, PA-C  levofloxacin (LEVAQUIN) 750 MG tablet TAKE 1 TABLET (750 MG TOTAL) BY MOUTH DAILY. 01/30/16  Yes Randall Hiss, MD  metoprolol succinate (TOPROL XL) 25 MG 24 hr tablet Take 1 tablet (25 mg total) by mouth daily. 06/23/15  Yes [name], MD    Family History Family History  Problem Relation Age of Onset  . Heart murmur Brother     Social History Social History  Substance Use Topics  . Smoking status: Never Smoker  . Smokeless tobacco: Former Neurosurgeon    Quit date: 12/30/2013  . Alcohol use No     Comment: socially     Allergies   Cefepime and Vancomycin   Review of Systems Review of Systems  Gastrointestinal: Positive for vomiting.  Skin: Positive for rash.  All other systems reviewed and are negative.    Physical Exam Updated Vital Signs BP 107/84 (BP Location: Left Arm)   Pulse 87   Temp 97.7 F (36.5 C) (Oral)   Resp 18   Ht 5\' 5"  (1.651 m)  Wt 110.2 kg   SpO2 100%   BMI 40.44 kg/m   Physical Exam  Constitutional: He is oriented to person, place, and time. He appears well-developed and well-nourished.  HENT:  Head: Normocephalic and atraumatic.  Oropharynx is clear, no throat swelling, no stridor  Eyes: Conjunctivae and EOM are normal. Pupils are equal, round, and reactive to light. Right eye exhibits no discharge. Left eye exhibits no discharge. No scleral icterus.  Neck: Normal range of motion. Neck supple. No JVD present.  Cardiovascular: Normal rate, regular rhythm and normal heart sounds.  Exam reveals no gallop and no friction rub.   No murmur heard. Pulmonary/Chest: Effort normal and breath sounds normal. No respiratory distress. He has no wheezes. He has no rales. He exhibits no tenderness.  Lung sounds are clear to auscultation  Abdominal: Soft. He exhibits no distension and no mass. There is no  tenderness. There is no rebound and no guarding.  Musculoskeletal: Normal range of motion. He exhibits no edema or tenderness.  Neurological: He is alert and oriented to person, place, and time.  Skin: Skin is warm and dry.  Diffuse hives  Psychiatric: He has a normal mood and affect. His behavior is normal. Judgment and thought content normal.  Nursing note and vitals reviewed.        ED Treatments / Results  Labs (all labs ordered are listed, but only abnormal results are displayed) Labs Reviewed - No data to display  EKG  EKG Interpretation None       Radiology No results found.  Procedures Procedures (including critical care time)  Medications Ordered in ED Medications  diphenhydrAMINE (BENADRYL) capsule 50 mg (50 mg Oral Given 03/22/16 1123)  ondansetron (ZOFRAN-ODT) disintegrating tablet 4 mg (4 mg Oral Given 03/22/16 1113)  famotidine (PEPCID) tablet 20 mg (20 mg Oral Given 03/22/16 1249)     Initial Impression / Assessment and Plan / ED Course  I have reviewed the triage vital signs and the nursing notes.  Pertinent labs & imaging results that were available during my care of the patient were reviewed by me and considered in my medical decision making (see chart for details).     Patient with complex past medical history. Presents with hives. Onset 2 days ago. No new contacts or exposures. Patient takes Levaquin daily, and has done so for the past 2 years. I discussed the case with Dr. Zenaida Niece dam of infectious disease, who recommends that the patient see a dermatologist. Also states that if needed, patient could discontinue the Levaquin 1 week to see if symptoms resolve. He will need to follow-up with infectious disease in one week.  I will also add Pepcid to the patient's already prescribed prednisone and Benadryl.  I contacted [facility] dermatology, who thankfully can see the patient today in their office. I have notified the patient that he needs to be at their  office by 3 PM.  Final Clinical Impressions(s) / ED Diagnoses   Final diagnoses:  Hives    New Prescriptions New Prescriptions   FAMOTIDINE (PEPCID) 20 MG TABLET    Take 1 tablet (20 mg total) by mouth 2 (two) times daily.     Roxy Horseman, PA-C 03/22/16 1309    Vanetta Mulders, MD 03/23/16 [phone]

## 2016-04-11 ENCOUNTER — Ambulatory Visit (INDEPENDENT_AMBULATORY_CARE_PROVIDER_SITE_OTHER): Payer: BC Managed Care – PPO | Admitting: *Deleted

## 2016-04-11 DIAGNOSIS — I442 Atrioventricular block, complete: Secondary | ICD-10-CM | POA: Diagnosis not present

## 2016-04-11 NOTE — Progress Notes (Signed)
Remote pacemaker transmission.   

## 2016-04-12 ENCOUNTER — Encounter: Payer: Self-pay | Admitting: Cardiology

## 2016-05-29 ENCOUNTER — Other Ambulatory Visit: Payer: Self-pay | Admitting: Cardiology

## 2016-05-29 NOTE — Telephone Encounter (Signed)
Rx request sent to pharmacy.  

## 2016-06-29 ENCOUNTER — Telehealth: Payer: Self-pay | Admitting: *Deleted

## 2016-06-29 NOTE — Telephone Encounter (Signed)
Patient notified

## 2016-06-29 NOTE — Telephone Encounter (Signed)
Definitely no problem at all

## 2016-06-29 NOTE — Telephone Encounter (Signed)
Patient wife called to ask if it is ok for the patient to have TB and Hepatitis B shots from his employer as he is still taking Levoquin for +Group B and endocarditis. Advised her will have to ask his provider and give them a call back.

## 2016-07-11 ENCOUNTER — Ambulatory Visit (INDEPENDENT_AMBULATORY_CARE_PROVIDER_SITE_OTHER): Payer: BC Managed Care – PPO | Admitting: Internal Medicine

## 2016-07-11 ENCOUNTER — Encounter: Payer: Self-pay | Admitting: Internal Medicine

## 2016-07-11 VITALS — BP 100/70 | HR 77 | Ht 65.0 in | Wt 240.0 lb

## 2016-07-11 DIAGNOSIS — Z950 Presence of cardiac pacemaker: Secondary | ICD-10-CM | POA: Diagnosis not present

## 2016-07-11 DIAGNOSIS — I442 Atrioventricular block, complete: Secondary | ICD-10-CM

## 2016-07-11 NOTE — Progress Notes (Signed)
HPI Mr. Weissberg returns today for pacemaker follow-up. He is a very pleasant 31 year old man with a complex past medical history including group B strep infection, resulting in endocarditis and a ventricular septal defect, who underwent surgical repair initially in 2015, complicated by complete heart block. He developed recurrent infection with dehiscent's of his repair and presented in 2016. He underwent revision and postoperatively had atrial fibrillation. He ultimately improved and recovered and has been maintained on a long course of intravenous antibiotic therapy followed by supressive anti-biotics chronically. He denies fevers or chills. He is on Levaquin. He denies palpitations. No peripheral edema. He has increased his physical activity. He is working having gotten off the night shift and now working the day shift for the [employer].  Allergies  Allergen Reactions  . Cefepime Other (See Comments)    Leukopenia not clear if due to vancomycin vs cefepime  . Vancomycin Other (See Comments)    ? Drug induced leukopenia vs being due to cefepime     Current Outpatient Prescriptions  Medication Sig Dispense Refill  . aspirin EC 325 MG EC tablet Take 1 tablet (325 mg total) by mouth daily. 30 tablet 0  . levofloxacin (LEVAQUIN) 750 MG tablet TAKE 1 TABLET (750 MG TOTAL) BY MOUTH DAILY. 30 tablet 11  . metoprolol succinate (TOPROL-XL) 25 MG 24 hr tablet Take 1 tablet (25 mg total) by mouth daily. Please schedule appointment for refills. 90 tablet 0   No current facility-administered medications for this visit.      Past Medical History:  Diagnosis Date  . AKI (acute kidney injury) (HCC)   . Brain abscess   . Drug-induced leukopenia (HCC) 06/15/2014  . Endocarditis    related to meningitis   . Fracture of left femur (HCC)   . History of open heart surgery    December 30, 2013- ascending aortic root replacement, TEE  . Meningitis    Group B Strep (November 2015)  . moderate  residual aortic insufficiency s/p allograft aortic root replacement 03/08/2014  . Obesity   . Patent foramen ovale    Closed during procedure on December 30, 2013  . Presence of permanent cardiac pacemaker 01/04/2014   PPM MEDTRONIC FOR COMPLETE HEART BLOCK  . 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 S/P aortic root and valve allograft 12/30/2013   Human allograft aortic root replacement with repair of aorta-right atrial fistula and tricuspid valve repair  . S/P placement of cardiac pacemaker 01/04/2014   Medtronic (serial number [id number]) dual chamber permanent pacemaker implanted by Dr Ladona Ridgel  . S/P redo patent foramen ovale closure 02/12/2014  . S/P redo tricuspid valve repair 02/12/2014   Complex valvuloplasty including patch closure of VSD with plication of septal leaflet and 26 mm Edwards mc3 ring annuloplasty  . S/P ventricular septal defect repair + redo tricuspid valve repair + redo closure PFO 02/12/2014   Redo sternotomy for bovine pericardial patch repair of large ventricular septal defect (recurrent LVOT to RA fistula due to bacterial endocarditis)  . Smokeless tobacco use   . Thrombocytopenia (HCC) 12/2013  . Tricuspid regurgitation 02/11/2014  . Ventricular septal defect 02/12/2014   Post-infectious s/p repair of LVOT to RA fistula for bacterial endocarditis    ROS:   All systems reviewed and negative except as noted in the HPI.   Past Surgical History:  Procedure Laterality Date  . ASCENDING AORTIC ROOT REPLACEMENT N/A 12/30/2013   Procedure: ASCENDING AORTIC ROOT REPLACEMENT with 23mm HOMOGRAFT, CLOSURE OF  AORTIC TO RIGHT ATRIAL FISTULA, CLOSURE OF PATENT FORAMEN OVALE;  Surgeon: Delight Ovens, MD;  Location: [facility] OR;  Service: Open Heart Surgery;  Laterality: N/A;  . INSERT / REPLACE / REMOVE PACEMAKER  01/04/2014  . INTRAOPERATIVE TRANSESOPHAGEAL ECHOCARDIOGRAM N/A 12/30/2013   Procedure: INTRAOPERATIVE TRANSESOPHAGEAL ECHOCARDIOGRAM;  Surgeon: Delight Ovens, MD;  Location: [facility] OR;  Service: Open Heart Surgery;  Laterality: N/A;  . PERMANENT PACEMAKER INSERTION N/A 01/04/2014   Procedure: PERMANENT PACEMAKER INSERTION;  Surgeon: Marinus Maw, MD;  Location: [facility] CATH LAB;  Service: Cardiovascular;  Laterality: N/A;  . RIGHT HEART CATHETERIZATION N/A 02/12/2014   Procedure: RIGHT HEART CATH;  Surgeon: Laurey Morale, MD;  Location: [facility] CATH LAB;  Service: Cardiovascular;  Laterality: N/A;  . TEE WITHOUT CARDIOVERSION N/A 02/12/2014   Procedure: TRANSESOPHAGEAL ECHOCARDIOGRAM (TEE);  Surgeon: Quintella Reichert, MD;  Location: [facility] ENDOSCOPY;  Service: Cardiovascular;  Laterality: N/A;  . TEE WITHOUT CARDIOVERSION N/A 04/30/2014   Procedure: TRANSESOPHAGEAL ECHOCARDIOGRAM (TEE);  Surgeon: Jake Bathe, MD;  Location: [facility] ENDOSCOPY;  Service: Cardiovascular;  Laterality: N/A;  . TRICUSPID VALVE REPLACEMENT N/A 12/30/2013   Procedure: REPAIR OF TRICUSPID VALVE LEAFLET;  Surgeon: Delight Ovens, MD;  Location: [facility] OR;  Service: Open Heart Surgery;  Laterality: N/A;  . TRICUSPID VALVE REPLACEMENT N/A 02/12/2014   Procedure: TRICUSPID VALVE REPAIR, REDO MEDIAN STERNOTOMY, REPAIR OF VENTRICULAR SEPTAL DEFECT (RECURRENT LV OUTFLOW TRACT TO RIGHT ATRIAL FISTULA), REDO CLOSURE OF PATENT FORAMEN OVALE;  Surgeon: Purcell Nails, MD;  Location: [facility] OR;  Service: Open Heart Surgery;  Laterality: N/A;     Family History  Problem Relation Age of Onset  . Heart murmur Brother      Social History   Social History  . Marital status: Single    Spouse name: N/A  . Number of children: N/A  . Years of education: N/A   Occupational History  . Not on file.   Social History Main Topics  . Smoking status: Never Smoker  . Smokeless tobacco: Former Neurosurgeon    Quit date: 12/30/2013  . Alcohol use No     Comment: socially  . Drug use: No  . Sexual activity: Yes    Birth control/ protection: Condom   Other Topics Concern  . Not on file   Social History Narrative    . No narrative on file     BP 100/70   Pulse 77   Ht 5\' 5"  (1.651 m)   Wt 240 lb (108.9 kg)   SpO2 98%   BMI 39.94 kg/m   Physical Exam:  Well appearing 30 year old man, NAD HEENT: Unremarkable Neck:  7 cm JVD, no thyromegally Lymphatics:  No adenopathy Back:  No CVA tenderness Lungs:  Clear, with no wheezes, rales, or rhonchi. HEART:  Regular rate rhythm, no murmurs, no rubs, no clicks Abd:  soft, positive bowel sounds, no organomegally, no rebound, no guarding Ext:  2 plus pulses, no edema, no cyanosis, no clubbing Skin:  No rashes no nodules Neuro:  CN II through XII intact, motor grossly intact   DEVICE  Normal device function.  See PaceArt for details.    Assess/Plan: 1. Complete heart block - he is doing well, s/p PPM insertion 2. PPM - his medtronic device is working normally. Will recheck in several months 3. Bacterial endocarditis - he is s/p surgery and is doing well with no evidence of active infection. He is on suppressive anti-biotics. 4. VSD - he is s/p  repair. He is asymptomatic.  Leonia Reeves.D.

## 2016-07-11 NOTE — Patient Instructions (Signed)
Medication Instructions:  Your physician recommends that you continue on your current medications as directed. Please refer to the Current Medication list given to you today.   Labwork: None Ordered   Testing/Procedures: None Ordered   Follow-Up: Your physician wants you to follow-up in: 1 year with Dr. Taylor. You will receive a reminder letter in the mail two months in advance. If you don't receive a letter, please call our office to schedule the follow-up appointment.  Remote monitoring is used to monitor your Pacemaker from home. This monitoring reduces the number of office visits required to check your device to one time per year. It allows us to keep an eye on the functioning of your device to ensure it is working properly. You are scheduled for a device check from home on  10/10/16 . You may send your transmission at any time that day. If you have a wireless device, the transmission will be sent automatically. After your physician reviews your transmission, you will receive a postcard with your next transmission date.    Any Other Special Instructions Will Be Listed Below (If Applicable).     If you need a refill on your cardiac medications before your next appointment, please call your pharmacy.   

## 2016-07-28 ENCOUNTER — Encounter: Payer: Self-pay | Admitting: Infectious Disease

## 2016-07-30 ENCOUNTER — Telehealth: Payer: Self-pay

## 2016-07-30 NOTE — Telephone Encounter (Signed)
To Dr Luciana Axe who is covering for Dr. Daiva Eves   Patient has  concerns about mucus mixed in soft stools but states his main purpose for calling is he notices a purple film on the toilet seat after he stands up.  Seat of stool started light purple and has now changed to dark purple with in one month.  The patient and his wife both use the same  toilet.   They has not been a change in cleaning products.  They use Ajax. Patient states he is afraid he may have contracted some type of bacterial infection and with his history of endocarditis and several other medical conditions he wants to be cautious.   Please advise.     Laurell Josephs, RN

## 2016-07-30 NOTE — Telephone Encounter (Signed)
This is not related to his antibiotic or his endocarditis

## 2016-07-30 NOTE — Telephone Encounter (Signed)
I don't know what is causing a purple film but is not related to the antibiotic or endocarditis history.  The antibiotic may cause soft stools.   Hope that helps

## 2016-07-30 NOTE — Telephone Encounter (Signed)
Patient informed .  Advised he may want to exchange the toilet seat to see if the problem exist.  Also try a different cleaning agent.   Laurell Josephs, RN

## 2016-08-04 LAB — CUP PACEART INCLINIC DEVICE CHECK
Battery Impedance: 160 Ohm
Brady Statistic AP VP Percent: 10 %
Brady Statistic AP VS Percent: 0 %
Brady Statistic AS VP Percent: 90 %
Brady Statistic AS VS Percent: 0 %
Implantable Lead Implant Date: 20151123
Implantable Lead Location: 753860
Implantable Lead Model: 5076
Implantable Lead Model: 5076
Lead Channel Impedance Value: 401 Ohm
Lead Channel Impedance Value: 410 Ohm
Lead Channel Pacing Threshold Amplitude: 0.5 V
Lead Channel Pacing Threshold Pulse Width: 0.4 ms
Lead Channel Sensing Intrinsic Amplitude: 1.4 mV
MDC IDC LEAD IMPLANT DT: 20151123
MDC IDC LEAD LOCATION: 753859
MDC IDC MSMT BATTERY REMAINING LONGEVITY: 115 mo
MDC IDC MSMT BATTERY VOLTAGE: 2.78 V
MDC IDC MSMT LEADCHNL RV PACING THRESHOLD AMPLITUDE: 0.75 V
MDC IDC MSMT LEADCHNL RV PACING THRESHOLD PULSEWIDTH: 0.4 ms
MDC IDC PG IMPLANT DT: 20151123
MDC IDC SESS DTM: 20180530185238
MDC IDC SET LEADCHNL RA PACING AMPLITUDE: 1.5 V
MDC IDC SET LEADCHNL RV PACING AMPLITUDE: 2.5 V
MDC IDC SET LEADCHNL RV PACING PULSEWIDTH: 0.4 ms
MDC IDC SET LEADCHNL RV SENSING SENSITIVITY: 4 mV

## 2016-08-13 DIAGNOSIS — R197 Diarrhea, unspecified: Secondary | ICD-10-CM | POA: Diagnosis not present

## 2016-08-14 DIAGNOSIS — R197 Diarrhea, unspecified: Secondary | ICD-10-CM | POA: Diagnosis not present

## 2016-08-23 DIAGNOSIS — R195 Other fecal abnormalities: Secondary | ICD-10-CM | POA: Diagnosis not present

## 2016-08-25 IMAGING — CR DG CHEST 2V
2 series · 2 of 2 positions shown · non-contrast
Comparison: 03/15/2014 and earlier.

CLINICAL DATA: 28-year-old male with personal history of bacterial
endocarditis and prior surgery. Subsequent encounter.

EXAM:
CHEST  2 VIEW

[view not recorded (1 of 2)]
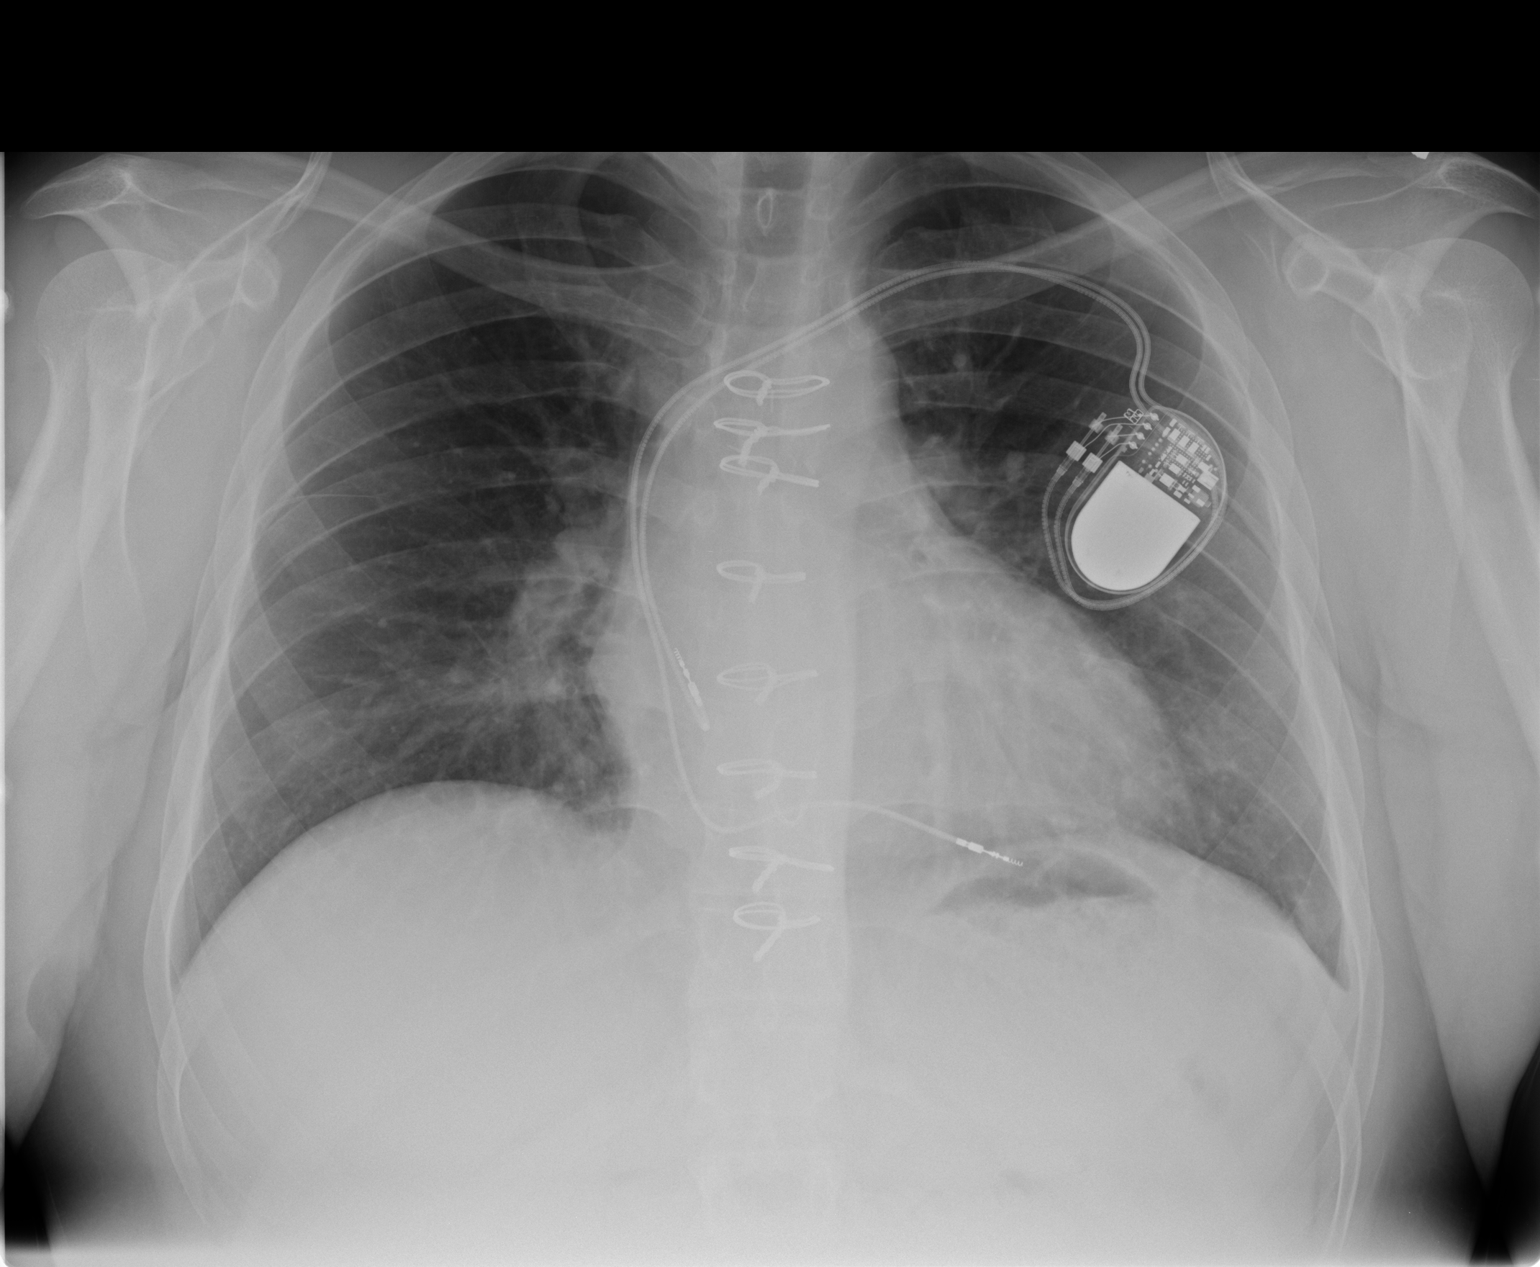

[view not recorded (2 of 2)]
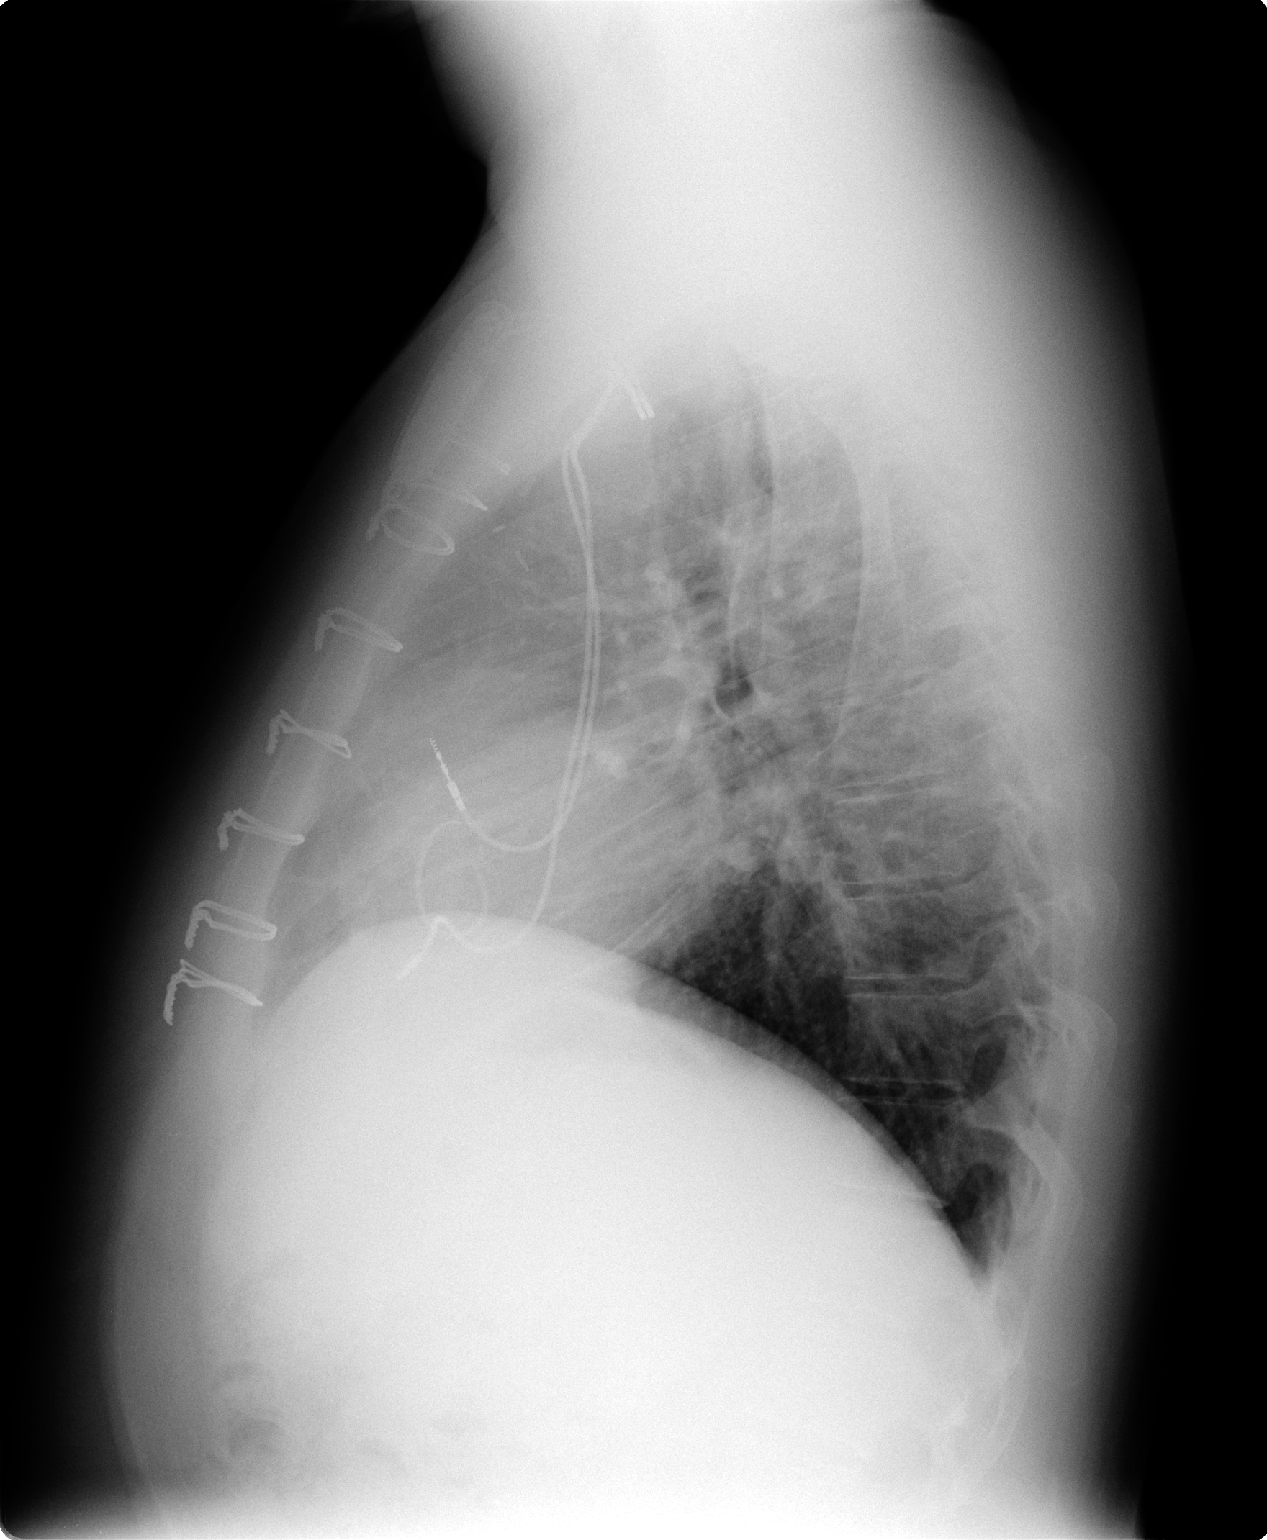

[2 of 2 positions shown; findings below may reference images not displayed]

FINDINGS: Stable left chest cardiac pacemaker. Stable cardiac size and
mediastinal contours. Stable lung volumes. Mild streaky perihilar
opacity has not significantly changed. No pneumothorax, pulmonary
edema, pleural effusion or acute pulmonary opacity. Stable
visualized osseous structures. Visualized tracheal air column is
within normal limits.
IMPRESSION: Stable mild nonspecific perihilar opacity. No new cardiopulmonary
abnormality.

## 2016-09-05 ENCOUNTER — Other Ambulatory Visit: Payer: Self-pay | Admitting: Cardiology

## 2016-09-26 ENCOUNTER — Telehealth: Payer: Self-pay | Admitting: Internal Medicine

## 2016-09-26 ENCOUNTER — Encounter: Payer: Self-pay | Admitting: Cardiology

## 2016-09-26 ENCOUNTER — Telehealth: Payer: Self-pay | Admitting: Cardiology

## 2016-09-26 NOTE — Telephone Encounter (Signed)
New message    Pt wife is calling to ask if it's going to be ok if pt goes to [place] on Sunday?

## 2016-09-26 NOTE — Telephone Encounter (Signed)
Spoke with [name] informed her that it was ok for pt to got to [employer], historically in the past we have not had any patients that have had issues with roller coasters.

## 2016-09-26 NOTE — Telephone Encounter (Signed)
New message   Pt wife wants to talk to Stanton Kidney about [employer] again

## 2016-09-26 NOTE — Telephone Encounter (Signed)
Spoke with pt wife, Aware of dr crenshaw's recommendations.  

## 2016-09-26 NOTE — Telephone Encounter (Signed)
Ok for [employer]

## 2016-09-26 NOTE — Telephone Encounter (Signed)
This encounter was created in error - please disregard.

## 2016-09-26 NOTE — Telephone Encounter (Signed)
[name] calling, states that patient is going to [employer] on Sunday and would like to make sure that it would be okay. [name] mentions that the roller coasters have large magnets and patient has pacemaker as well as titanium ring in heart.

## 2016-10-04 ENCOUNTER — Other Ambulatory Visit: Payer: Self-pay | Admitting: Cardiology

## 2016-10-10 ENCOUNTER — Encounter: Payer: 59 | Admitting: *Deleted

## 2016-10-12 ENCOUNTER — Encounter: Payer: Self-pay | Admitting: Cardiology

## 2016-10-31 ENCOUNTER — Other Ambulatory Visit: Payer: Self-pay | Admitting: Cardiology

## 2016-11-17 ENCOUNTER — Other Ambulatory Visit: Payer: Self-pay | Admitting: Cardiology

## 2016-11-19 NOTE — Telephone Encounter (Signed)
Rx(s) sent to pharmacy electronically.  

## 2016-12-07 ENCOUNTER — Emergency Department (HOSPITAL_BASED_OUTPATIENT_CLINIC_OR_DEPARTMENT_OTHER): Payer: Worker's Compensation

## 2016-12-07 ENCOUNTER — Encounter (HOSPITAL_BASED_OUTPATIENT_CLINIC_OR_DEPARTMENT_OTHER): Payer: Self-pay

## 2016-12-07 ENCOUNTER — Emergency Department (HOSPITAL_BASED_OUTPATIENT_CLINIC_OR_DEPARTMENT_OTHER)
Admission: EM | Admit: 2016-12-07 | Discharge: 2016-12-07 | Disposition: A | Discharge status: Home / Self Care | Payer: Worker's Compensation | Attending: Emergency Medicine | Admitting: Emergency Medicine

## 2016-12-07 DIAGNOSIS — Y929 Unspecified place or not applicable: Secondary | ICD-10-CM | POA: Diagnosis not present

## 2016-12-07 DIAGNOSIS — Y990 Civilian activity done for income or pay: Secondary | ICD-10-CM | POA: Diagnosis not present

## 2016-12-07 DIAGNOSIS — Z950 Presence of cardiac pacemaker: Secondary | ICD-10-CM | POA: Insufficient documentation

## 2016-12-07 DIAGNOSIS — S8391XA Sprain of unspecified site of right knee, initial encounter: Secondary | ICD-10-CM | POA: Insufficient documentation

## 2016-12-07 DIAGNOSIS — W0110XA Fall on same level from slipping, tripping and stumbling with subsequent striking against unspecified object, initial encounter: Secondary | ICD-10-CM | POA: Diagnosis not present

## 2016-12-07 DIAGNOSIS — Y9389 Activity, other specified: Secondary | ICD-10-CM | POA: Diagnosis not present

## 2016-12-07 DIAGNOSIS — Z79899 Other long term (current) drug therapy: Secondary | ICD-10-CM | POA: Insufficient documentation

## 2016-12-07 DIAGNOSIS — S8991XA Unspecified injury of right lower leg, initial encounter: Secondary | ICD-10-CM | POA: Diagnosis present

## 2016-12-07 DIAGNOSIS — Z7982 Long term (current) use of aspirin: Secondary | ICD-10-CM | POA: Insufficient documentation

## 2016-12-07 MED ORDER — LIDOCAINE 5 % EX PTCH: 1.0000 | MEDICATED_PATCH | CUTANEOUS | 0 refills | Status: DC

## 2016-12-07 NOTE — ED Provider Notes (Signed)
[facility] EMERGENCY DEPARTMENT Provider Note   CSN: [id number] Arrival date & time: 12/07/16  0932     History   Chief Complaint Chief Complaint  Patient presents with  . Knee Injury    right    HPI [name] is a 31 y.o. male.  HPI 31 year old Caucasian male presents to the ED for evaluation of right knee pain.  Patient states that he was at work yesterday during a training exercise when a coworker was tased and fell onto his right knee.  Patient states that since then he has had pain with range of motion and ambulation.  He tried icing it and Ace wrapping at home with little relief.  States the pain has persisted with flexion and ambulation.  Reports minimal swelling.  Denies any associated paresthesias or weakness.  Nothing makes better or worse. Past Medical History:  Diagnosis Date  . AKI (acute kidney injury) (HCC)   . Brain abscess   . Drug-induced leukopenia (HCC) 06/15/2014  . Endocarditis    related to meningitis   . Fracture of left femur (HCC)   . History of open heart surgery    December 30, 2013- ascending aortic root replacement, TEE  . Meningitis    Group B Strep (November 2015)  . moderate residual aortic insufficiency s/p allograft aortic root replacement 03/08/2014  . Obesity   . Patent foramen ovale    Closed during procedure on December 30, 2013  . Presence of permanent cardiac pacemaker 01/04/2014   PPM MEDTRONIC FOR COMPLETE HEART BLOCK  . 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 S/P aortic root and valve allograft 12/30/2013   Human allograft aortic root replacement with repair of aorta-right atrial fistula and tricuspid valve repair  . S/P placement of cardiac pacemaker 01/04/2014   Medtronic (serial number [id number]) dual chamber permanent pacemaker implanted by Dr Ladona Ridgel  . S/P redo patent foramen ovale closure 02/12/2014  . S/P redo tricuspid valve repair 02/12/2014   Complex valvuloplasty including patch closure of VSD with  plication of septal leaflet and 26 mm Edwards mc3 ring annuloplasty  . S/P ventricular septal defect repair + redo tricuspid valve repair + redo closure PFO 02/12/2014   Redo sternotomy for bovine pericardial patch repair of large ventricular septal defect (recurrent LVOT to RA fistula due to bacterial endocarditis)  . Smokeless tobacco use   . Thrombocytopenia (HCC) 12/2013  . Tricuspid regurgitation 02/11/2014  . Ventricular septal defect 02/12/2014   Post-infectious s/p repair of LVOT to RA fistula for bacterial endocarditis    Patient Active Problem List   Diagnosis Date Noted  . Congestive dilated cardiomyopathy (HCC) 06/23/2015  . Pedal edema 05/12/2015  . 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 Drug-induced leukopenia (HCC) 06/15/2014  . Endocarditis   . Aortic valve vegetation on echo 04/29/14 04/29/2014  . Lower back pain 03/10/2014  . residual aortic insufficiency s/p allograft aortic root replacement 03/08/2014  . Group B streptococcal infection   . S/P ventricular septal defect repair + redo tricuspid valve repair + redo closure PFO 02/12/2014  . S/P redo tricuspid valve repair 02/12/2014  . S/P redo patent foramen ovale closure 02/12/2014  . Dyspnea   . Pacemaker- MDT Nov 2015 02/11/2014  . Dyspnea on exertion 02/11/2014  . S/P placement of cardiac pacemaker 01/04/2014  . Third degree heart block (HCC) 12/31/2013  . Endocarditis of aortic valve-November 2015   . S/P aortic root and valve allograft Nov 2015with re do surgery Jan 1st 2016  12/30/2013  . Bacterial endocarditis   . Bilateral edema of lower extremity   . Sepsis due to group B Streptococcus (HCC)   . LFT elevation   . Headache   . Pain in joint, lower leg   . Dehydration     Past Surgical History:  Procedure Laterality Date  . ASCENDING AORTIC ROOT REPLACEMENT N/A 12/30/2013   Procedure: ASCENDING AORTIC ROOT REPLACEMENT with 23mm HOMOGRAFT, CLOSURE OF AORTIC TO RIGHT ATRIAL FISTULA, CLOSURE OF  PATENT FORAMEN OVALE;  Surgeon: Delight OvensEdward B Gerhardt, MD;  Location: [facility] OR;  Service: Open Heart Surgery;  Laterality: N/A;  . INSERT / REPLACE / REMOVE PACEMAKER  01/04/2014  . INTRAOPERATIVE TRANSESOPHAGEAL ECHOCARDIOGRAM N/A 12/30/2013   Procedure: INTRAOPERATIVE TRANSESOPHAGEAL ECHOCARDIOGRAM;  Surgeon: Delight OvensEdward B Gerhardt, MD;  [facility];  Service: Open Heart Surgery;  Laterality: N/A;  . PERMANENT PACEMAKER INSERTION N/A 01/04/2014   Procedure: PERMANENT PACEMAKER INSERTION;  Surgeon: Marinus MawGregg W Taylor, MD;  [facility];  Service: Cardiovascular;  Laterality: N/A;  . RIGHT HEART CATHETERIZATION N/A 02/12/2014   Procedure: RIGHT HEART CATH;  Surgeon: Laurey Moralealton S McLean, MD;  [facility];  Service: Cardiovascular;  Laterality: N/A;  . TEE WITHOUT CARDIOVERSION N/A 02/12/2014   Procedure: TRANSESOPHAGEAL ECHOCARDIOGRAM (TEE);  Surgeon: Quintella Reichertraci R Turner, MD;  [facility];  Service: Cardiovascular;  Laterality: N/A;  . TEE WITHOUT CARDIOVERSION N/A 04/30/2014   Procedure: TRANSESOPHAGEAL ECHOCARDIOGRAM (TEE);  Surgeon: Jake BatheMark C Skains, MD;  [facility];  Service: Cardiovascular;  Laterality: N/A;  . TRICUSPID VALVE REPLACEMENT N/A 12/30/2013   Procedure: REPAIR OF TRICUSPID VALVE LEAFLET;  Surgeon: Delight OvensEdward B Gerhardt, MD;  [facility];  Service: Open Heart Surgery;  Laterality: N/A;  . TRICUSPID VALVE REPLACEMENT N/A 02/12/2014   Procedure: TRICUSPID VALVE REPAIR, REDO MEDIAN STERNOTOMY, REPAIR OF VENTRICULAR SEPTAL DEFECT (RECURRENT LV OUTFLOW TRACT TO RIGHT ATRIAL FISTULA), REDO CLOSURE OF PATENT FORAMEN OVALE;  Surgeon: Purcell Nailslarence H Owen, MD;  Location: [facility] OR;  Service: Open Heart Surgery;  Laterality: N/A;       Home Medications    Prior to Admission medications   Medication Sig Start Date End Date Taking? Authorizing Provider  aspirin EC 81 MG tablet Take 81 mg by mouth daily.   Yes [provider]  levofloxacin (LEVAQUIN) 750 MG tablet TAKE 1  TABLET (750 MG TOTAL) BY MOUTH DAILY. 01/30/16  Yes Daiva EvesVan Dam, Lisette Grinderornelius N, MD  metoprolol succinate (TOPROL-XL) 25 MG 24 hr tablet Take 1 tablet (25 mg total) by mouth daily. <PLEASE MAKE APPOINTMENT FOR REFILLS> 11/19/16  Yes Crenshaw, Madolyn FriezeBrian S, MD    Family History Family History  Problem Relation Age of Onset  . Heart murmur Brother     Social History Social History  Substance Use Topics  . Smoking status: Never Smoker  . Smokeless tobacco: Former NeurosurgeonUser    Quit date: 12/30/2013  . Alcohol use 0.0 oz/week     Comment: socially     Allergies   Cefepime and Vancomycin   Review of Systems Review of Systems  Musculoskeletal: Positive for myalgias.  Skin: Negative for color change and wound.  Neurological: Negative for weakness and numbness.     Physical Exam Updated Vital Signs BP 121/68 (BP Location: Left Arm)   Pulse 68   Temp 97.7 F (36.5 C) (Oral)   Resp 18   Ht 5\' 5"  (1.651 m)   Wt 108.9 kg (240 lb)   SpO2 100%   BMI 39.94 kg/m  Physical Exam  Constitutional: He appears well-developed and well-nourished. No distress.  HENT:  Head: Normocephalic and atraumatic.  Eyes: Right eye exhibits no discharge. Left eye exhibits no discharge. No scleral icterus.  Neck: Normal range of motion.  Pulmonary/Chest: No respiratory distress.  Musculoskeletal:       Right knee: He exhibits decreased range of motion (due to pain), swelling and abnormal meniscus (pain with internal and external rotation of keen). He exhibits no effusion, no ecchymosis, no deformity, no erythema, normal alignment, no LCL laxity, normal patellar mobility and no MCL laxity. Tenderness found. Medial joint line tenderness noted. No lateral joint line, no MCL, no LCL and no patellar tendon tenderness noted.  Negative anterior drawer test.  No joint laxity with valgus or varus stress.  DP pulses are 2+ bilaterally.  Sensation intact.  Cap refill is normal.  Full range of motion of the right ankle and right  hip without any pain.  No obvious ecchymosis or edema noted to the right lower extremity.  Neurological: He is alert.  Skin: No pallor.  Psychiatric: His behavior is normal. Judgment and thought content normal.  Nursing note and vitals reviewed.    ED Treatments / Results  Labs (all labs ordered are listed, but only abnormal results are displayed) Labs Reviewed - No data to display  EKG  EKG Interpretation None       Radiology Dg Knee Complete 4 Views Right  Result Date: 12/07/2016 CLINICAL DATA:  Right knee injury. Pain. Decreased range of motion. EXAM: RIGHT KNEE - COMPLETE 4+ VIEW COMPARISON:  No recent prior . FINDINGS: Small knee joint effusion. No evidence of fracture. No evidence of dislocation. IMPRESSION: Small knee joint effusion.  No acute bony abnormality . Electronically Signed   By: Maisie Fus  Register   On: 12/07/2016 10:47    Procedures Procedures (including critical care time)  Medications Ordered in ED Medications - No data to display   Initial Impression / Assessment and Plan / ED Course  I have reviewed the triage vital signs and the nursing notes.  Pertinent labs & imaging results that were available during my care of the patient were reviewed by me and considered in my medical decision making (see chart for details).     Patient X-Ray negative for obvious fracture or dislocation.  Patient is neurovascularly intact.  Does show a small joint effusion however no indication for draining at this time.  Pain managed in ED. Pt advised to follow up with orthopedics if symptoms persist for possibility of missed fracture diagnosis. Patient given brace while in ED, conservative therapy recommended and discussed. Patient will be dc home & is agreeable with above plan.   Final Clinical Impressions(s) / ED Diagnoses   Final diagnoses:  Sprain of right knee, unspecified ligament, initial encounter    New Prescriptions New Prescriptions   No medications on file       Wallace Keller 12/07/16 1141    Vanetta Mulders, MD 12/11/16 [phone]

## 2016-12-07 NOTE — Discharge Instructions (Signed)
Your x-ray shows a small amount of fluid in your right knee but no signs of broken bones.  Your symptoms may be due to a sprain ligament versus ligament tear or meniscus tear.  Where the Ace wrap at all times.  Use the knee immobilizer when ambulating.  Weight-bear as tolerated with crutches. Please rest, ice, compress and elevated the affected body part to help with swelling and pain.  Motrin and Tylenol for pain and swelling.  Have given you Lidoderm patches to use over your right knee to help with the pain.  Please follow-up with orthopedic doctor or occupational medicine as needed.

## 2016-12-07 NOTE — ED Triage Notes (Signed)
Pt was at work doing a training exercise when another coworker was tazzed and fell onto the pt. Pt injured his right knee.

## 2016-12-07 NOTE — ED Notes (Signed)
ED Provider at bedside. 

## 2016-12-07 NOTE — ED Notes (Signed)
Patient transported to X-ray 

## 2016-12-08 ENCOUNTER — Other Ambulatory Visit: Payer: Self-pay | Admitting: Cardiology

## 2017-02-23 ENCOUNTER — Other Ambulatory Visit: Payer: Self-pay | Admitting: Infectious Disease

## 2017-02-23 DIAGNOSIS — I330 Acute and subacute infective endocarditis: Secondary | ICD-10-CM

## 2017-04-22 ENCOUNTER — Telehealth: Payer: Self-pay | Admitting: Cardiology

## 2017-04-22 ENCOUNTER — Other Ambulatory Visit: Payer: Self-pay | Admitting: Infectious Disease

## 2017-04-22 DIAGNOSIS — I330 Acute and subacute infective endocarditis: Secondary | ICD-10-CM

## 2017-04-22 MED ORDER — METOPROLOL SUCCINATE ER 25 MG PO TB24: 25.0000 mg | ORAL_TABLET | Freq: Every day | ORAL | 0 refills | Status: DC

## 2017-04-22 NOTE — Telephone Encounter (Signed)
Needs office visit.

## 2017-04-22 NOTE — Telephone Encounter (Signed)
New Message     *STAT* If patient is at the pharmacy, call can be transferred to refill team.   1. Which medications need to be refilled? (please list name of each medication and dose if known) metoprolol succinate (TOPROL-XL) 25 MG 24 hr tablet  2. Which pharmacy/location (including street and city if local pharmacy) is medication to be sent to? [facility]   3. Do they need a 30 day or 90 day supply? 90

## 2017-06-06 ENCOUNTER — Telehealth: Payer: Self-pay | Admitting: *Deleted

## 2017-06-06 NOTE — Telephone Encounter (Signed)
Patient called for refills on Levaquin but he has not been here to see the doctor since 11/2015 and it requires a visit for more refills. Gave him an appt to see his doctor 06/10/17.

## 2017-06-10 ENCOUNTER — Encounter: Payer: Self-pay | Admitting: Infectious Disease

## 2017-06-10 ENCOUNTER — Ambulatory Visit: Payer: 59 | Admitting: Infectious Disease

## 2017-06-10 ENCOUNTER — Other Ambulatory Visit: Payer: Self-pay

## 2017-06-10 VITALS — BP 125/87 | HR 90 | Temp 98.1°F | Ht 65.0 in | Wt 249.0 lb

## 2017-06-10 DIAGNOSIS — G060 Intracranial abscess and granuloma: Secondary | ICD-10-CM

## 2017-06-10 DIAGNOSIS — I330 Acute and subacute infective endocarditis: Secondary | ICD-10-CM | POA: Diagnosis not present

## 2017-06-10 DIAGNOSIS — G009 Bacterial meningitis, unspecified: Secondary | ICD-10-CM

## 2017-06-10 DIAGNOSIS — A491 Streptococcal infection, unspecified site: Secondary | ICD-10-CM | POA: Diagnosis not present

## 2017-06-10 LAB — CBC WITH DIFFERENTIAL/PLATELET
BASOS PCT: 0.8 %
Basophils Absolute: 50 cells/uL (ref 0–200)
EOS PCT: 6.6 %
Eosinophils Absolute: 416 cells/uL (ref 15–500)
HEMATOCRIT: 44.7 % (ref 38.5–50.0)
Hemoglobin: 15.3 g/dL (ref 13.2–17.1)
LYMPHS ABS: 1474 {cells}/uL (ref 850–3900)
MCH: 29.3 pg (ref 27.0–33.0)
MCHC: 34.2 g/dL (ref 32.0–36.0)
MCV: 85.6 fL (ref 80.0–100.0)
MPV: 10.5 fL (ref 7.5–12.5)
Monocytes Relative: 13.4 %
NEUTROS PCT: 55.8 %
Neutro Abs: 3515 cells/uL (ref 1500–7800)
PLATELETS: 210 10*3/uL (ref 140–400)
RBC: 5.22 10*6/uL (ref 4.20–5.80)
RDW: 13.2 % (ref 11.0–15.0)
Total Lymphocyte: 23.4 %
WBC mixed population: 844 cells/uL (ref 200–950)
WBC: 6.3 10*3/uL (ref 3.8–10.8)

## 2017-06-10 LAB — BASIC METABOLIC PANEL WITH GFR
BUN: 23 mg/dL (ref 7–25)
CALCIUM: 9.4 mg/dL (ref 8.6–10.3)
CHLORIDE: 105 mmol/L (ref 98–110)
CO2: 26 mmol/L (ref 20–32)
CREATININE: 1.32 mg/dL (ref 0.60–1.35)
GFR, Est African American: 84 mL/min/{1.73_m2} (ref >=60)
GFR, Est Non African American: 72 mL/min/{1.73_m2} (ref >=60)
GLUCOSE: 93 mg/dL (ref 65–99)
Potassium: 4.3 mmol/L (ref 3.5–5.3)
Sodium: 141 mmol/L (ref 135–146)

## 2017-06-10 LAB — SEDIMENTATION RATE: SED RATE: 2 mm/h (ref 0–15)

## 2017-06-10 MED ORDER — LEVOFLOXACIN 750 MG PO TABS: 750.0000 mg | ORAL_TABLET | Freq: Every day | ORAL | 11 refills | Status: DC

## 2017-06-10 NOTE — Progress Notes (Signed)
Chief complaint: followup for endocarditis, aortic abscess, brain abscesses due to Group B streptococci  Subjective:    Patient ID: [name], male    DOB: 29-Jun-1985, 32 y.o.   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 Valve sent to [facility] showed GBS by ribosomal 16S sequencing.   He has had trouble tolerating the high dose ceftriaxone and also completed 2 weeks of gentamicin. He was on Rocephin when I saw him on 02/02/14 and getting used to the abx but a few days later was found to have worsening dyspnea, heart failure change in heart sounds and was admitted and found  To have new Ventricular Septal Defect (recurrent LV outflow tract to right atrial fistula, Severe Tricuspid Regurgitatio, Recurrent Patent Foramen Ovale, and Acute Congestive Heart Failure.   He underwent :   Redo Median Sternotomy  Repair of Ventricular Septal Defect Double layer bovine pericardial patch closure  Tricuspid Valve Repair Complex Valvuloplasty including plication of septal leaflet Edwards mc3 Ring annuloplasty  On January 1st, 2016. Tissue was sent off to [facility] where PCR of Ribosome AGAIN WAS POSITIVE FOR GROUP B STREPTOCOCCUS.      NOTE Pacemaker from first CT surgery had remained in place through 2nd CT surgery and he is Pacer dependent.   IN the interim he was treated withn Rocephin high dose along with 2 weeks of gentamicin which he  finished).  His repeat Echo showed stability of his postoperative repairs.  He was then on oral amoxicillin and without any symptoms. His fiance was resting her head on his chest and discovered a  new murmur.  He was evaluated and had 2d echo showing  mobile vegetation measuring 1.3 x 1.7 cm seemingly attached to the bioprosthetic aortic valve,with mild to moderate AI on 04/29/14.  He was brought into the hospital and underwent TEE which read as showing:  The same large (1.5 x 1.1cm) mobile irregular bordered echodensity along the anterior aspect of the subvalvular apparatus of the bioprosthetic aortic valve in the left ventricular outflow tract consistent with vegetation. There is perivalvular abscess like pocket/space in the posterior region of aortic valve adjacent to intraatrial septum. There was mild regurgitation.  He had blood cultures taken on amoxicillin which were negative and he was broadened ultimately to vancomycin and cefepime.   He was followed closely by Cardiology and CT surgery.   Dr. Tyrone Sage reviewed the TEE and noted that the  Lesion was in the outflow tract of the left ventricle and not on the aortic homograph leaflets. He did not recommend repeat 3rd open heart surgery.  Patient had  been dc to home on IV vancomycin and cefepime.   We found that he then developed leukopenia, vancomycin was stopped, but it did not quickly resolve.  So we stopped cefepime as well. He was out of town so we bridged him with high dose oral levaquin 750mg  daily until he could get back to [facility] and he was switched to IV daptomycin which he has been on through his 7 week of his most recent round of abx therapy. TTE done in mid April  2106 shows that his vegetation has shrunk in size.  We then switched him  to high dose oral levaquin 750mg  daily  Since then repeat TTE done in July 2016 and read by Dr. Jens Som and Dr. Tyrone Sage and found to no longer have evidence of vegetation.  Another TTE done in November that did not show any evidence for vegeation  He has been on levaquin. I had not seen him since 2017 but he was still getting meds. This past week ran out after our clinic  did not renew without visit.   He is doing well without worrisome symptoms or side effects.    Past Medical History:  Diagnosis Date  . AKI (acute kidney injury) (HCC)   . Brain abscess   . Drug-induced leukopenia (HCC) 06/15/2014  . Endocarditis    related to meningitis   . Fracture of left femur (HCC)   . History of open heart surgery    December 30, 2013- ascending aortic root replacement, TEE  . Meningitis    Group B Strep (November 2015)  . moderate residual aortic insufficiency s/p allograft aortic root replacement 03/08/2014  . Obesity   . Patent foramen ovale    Closed during procedure on December 30, 2013  . Presence of permanent cardiac pacemaker 01/04/2014   PPM MEDTRONIC FOR COMPLETE HEART BLOCK  . 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 S/P aortic root and valve allograft 12/30/2013   Human allograft aortic root replacement with repair of aorta-right atrial fistula and tricuspid valve repair  . S/P placement of cardiac pacemaker 01/04/2014   Medtronic (serial number [id number]) dual chamber permanent pacemaker implanted by Dr Ladona Ridgel  . S/P redo patent foramen ovale closure 02/12/2014  . S/P redo tricuspid valve repair 02/12/2014   Complex valvuloplasty including patch closure of VSD with plication of septal leaflet and 26 mm Edwards mc3 ring annuloplasty  . S/P ventricular septal defect repair + redo tricuspid valve repair + redo closure PFO 02/12/2014   Redo sternotomy for bovine pericardial patch repair of large ventricular septal defect (recurrent LVOT to RA fistula due to bacterial endocarditis)  . Smokeless tobacco use   . Thrombocytopenia (HCC) 12/2013  . Tricuspid regurgitation 02/11/2014  . Ventricular septal defect 02/12/2014   Post-infectious s/p repair of LVOT to RA fistula for bacterial endocarditis    Past Surgical History:  Procedure Laterality Date  . ASCENDING AORTIC ROOT REPLACEMENT N/A 12/30/2013   Procedure: ASCENDING AORTIC ROOT REPLACEMENT  with 72mm HOMOGRAFT, CLOSURE OF AORTIC TO RIGHT ATRIAL FISTULA, CLOSURE OF PATENT FORAMEN OVALE;  Surgeon: Delight Ovens, MD;  Location: [facility] OR;  Service: Open Heart Surgery;  Laterality: N/A;  . INSERT / REPLACE / REMOVE PACEMAKER  01/04/2014  . INTRAOPERATIVE TRANSESOPHAGEAL ECHOCARDIOGRAM N/A 12/30/2013   Procedure: INTRAOPERATIVE TRANSESOPHAGEAL ECHOCARDIOGRAM;  Surgeon: Delight Ovens, MD;  Location: [facility] OR;  Service: Open Heart Surgery;  Laterality: N/A;  . PERMANENT PACEMAKER INSERTION N/A 01/04/2014   Procedure: PERMANENT PACEMAKER INSERTION;  Surgeon: Marinus Maw, MD;  Location: [facility] CATH LAB;  Service: Cardiovascular;  Laterality: N/A;  . RIGHT HEART CATHETERIZATION N/A 02/12/2014   Procedure: RIGHT HEART CATH;  Surgeon: Laurey Morale, MD;  Location: [facility] CATH LAB;  Service: Cardiovascular;  Laterality: N/A;  . TEE WITHOUT CARDIOVERSION N/A 02/12/2014   Procedure: TRANSESOPHAGEAL ECHOCARDIOGRAM (TEE);  Surgeon: Quintella Reichert, MD;  Location: [facility] ENDOSCOPY;  Service: Cardiovascular;  Laterality: N/A;  . TEE WITHOUT CARDIOVERSION N/A 04/30/2014   Procedure: TRANSESOPHAGEAL ECHOCARDIOGRAM (TEE);  Surgeon: Jake Bathe, MD;  Location: [facility] ENDOSCOPY;  Service: Cardiovascular;  Laterality: N/A;  .  TRICUSPID VALVE REPLACEMENT N/A 12/30/2013   Procedure: REPAIR OF TRICUSPID VALVE LEAFLET;  Surgeon: Delight Ovens, MD;  Location: [facility] OR;  Service: Open Heart Surgery;  Laterality: N/A;  . TRICUSPID VALVE REPLACEMENT N/A 02/12/2014   Procedure: TRICUSPID VALVE REPAIR, REDO MEDIAN STERNOTOMY, REPAIR OF VENTRICULAR SEPTAL DEFECT (RECURRENT LV OUTFLOW TRACT TO RIGHT ATRIAL FISTULA), REDO CLOSURE OF PATENT FORAMEN OVALE;  Surgeon: Purcell Nails, MD;  Location: [facility] OR;  Service: Open Heart Surgery;  Laterality: N/A;    Family History  Problem Relation Age of Onset  . Heart murmur Brother       Social History   Socioeconomic History  . Marital status: Single    Spouse name: Not on file  .  Number of children: Not on file  . Years of education: Not on file  . Highest education level: Not on file  Occupational History  . Not on file  Social Needs  . Financial resource strain: Not on file  . Food insecurity:    Worry: Not on file    Inability: Not on file  . Transportation needs:    Medical: Not on file    Non-medical: Not on file  Tobacco Use  . Smoking status: Never Smoker  . Smokeless tobacco: Former Engineer, water and Sexual Activity  . Alcohol use: Yes    Alcohol/week: 0.0 oz    Comment: socially  . Drug use: No  . Sexual activity: Yes    Birth control/protection: Condom  Lifestyle  . Physical activity:    Days per week: Not on file    Minutes per session: Not on file  . Stress: Not on file  Relationships  . Social connections:    Talks on phone: Not on file    Gets together: Not on file    Attends religious service: Not on file    Active member of club or organization: Not on file    Attends meetings of clubs or organizations: Not on file    Relationship status: Not on file  Other Topics Concern  . Not on file  Social History Narrative  . Not on file    Allergies  Allergen Reactions  . Cefepime Other (See Comments)    Leukopenia not clear if due to vancomycin vs cefepime  . Vancomycin Other (See Comments)    ? Drug induced leukopenia vs being due to cefepime     Current Outpatient Medications:  .  aspirin EC 81 MG tablet, Take 81 mg by mouth daily., Disp: , Rfl:  .  levofloxacin (LEVAQUIN) 750 MG tablet, TAKE 1 TABLET (750 MG TOTAL) BY MOUTH DAILY., Disp: 30 tablet, Rfl: 0 .  lidocaine (LIDODERM) 5 %, Place 1 patch onto the skin daily. Remove & Discard patch within 12 hours or as directed by MD, Disp: 6 patch, Rfl: 0 .  metoprolol succinate (TOPROL-XL) 25 MG 24 hr tablet, Take 1 tablet (25 mg total) by mouth daily. KEEP OV., Disp: 90 tablet, Rfl: 0    .Review of Systems  Constitutional: Negative for activity change, appetite change,  chills, diaphoresis and fever.  HENT: Negative for congestion, rhinorrhea, sinus pressure, sneezing, sore throat and trouble swallowing.   Eyes: Negative for photophobia and visual disturbance.  Respiratory: Negative for cough, chest tightness, shortness of breath, wheezing and stridor.   Cardiovascular: Negative for chest pain, palpitations and leg swelling.  Gastrointestinal: Negative for abdominal distention, blood in stool, constipation, diarrhea, nausea and vomiting.  Genitourinary: Negative for difficulty urinating,  dysuria, flank pain and hematuria.  Musculoskeletal: Negative for arthralgias, back pain, gait problem, joint swelling and myalgias.  Skin: Negative for color change, pallor, rash and wound.  Neurological: Negative for dizziness, tremors, weakness and light-headedness.  Hematological: Negative for adenopathy. Does not bruise/bleed easily.  Psychiatric/Behavioral: Negative for agitation, behavioral problems, confusion, decreased concentration, dysphoric mood and sleep disturbance.       Objective:   Physical Exam  Constitutional: He is oriented to person, place, and time. He appears well-developed and well-nourished.  HENT:  Head: Normocephalic and atraumatic.  Eyes: Conjunctivae and EOM are normal.  Neck: Normal range of motion. Neck supple.  Cardiovascular: Regular rhythm. Bradycardia present.  Murmur heard.  Systolic murmur is present with a grade of 2/6.   Pulmonary/Chest: Effort normal and breath sounds normal. No respiratory distress. He has no wheezes. He has no rales. He exhibits no tenderness.  Abdominal: Soft. Bowel sounds are normal. He exhibits no distension. There is no tenderness. There is no rebound.  Musculoskeletal: Normal range of motion. He exhibits no edema or tenderness.  Neurological: He is alert and oriented to person, place, and time.  Skin: Skin is warm and dry. No rash noted. No erythema. No pallor.  Psychiatric: He has a normal mood and  affect. His behavior is normal. Judgment and thought content normal.   PM, Sternotomy clean 07/21/14:             Assessment & Plan:   Group B streptococcal  Aortic valve endocarditis and Aorto-Right Atrial fistula sp AVR, TV repair, repair of Aorto to Right atrial fistula, placement of PM due to 3rd degree heart block, then Ventricular Septal Defect (recurrent LV outflow tract to right atrial fistula, Severe Tricuspid Regurgitatio, Recurrent Patent Foramen Ovale, and Acute Congestive Heart Failure. Sp Redo Median Sternotom, Repair of Ventricular Septal Defect, Double layer bovine pericardial patch closure, Tricuspid Valve Repair, Complex Valvuloplasty including plication of septal leaflet, Edwards mc3 Ring annuloplasty with Group B Streptococcus isolated from heart valve sp repeat IV rocephin x 6 weeks with 2 weeks of gentamicin, changed to oral amoxicillin but now yet again with evidence of large vegetation + possible abscess vs cavity. 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  I am fearful of risking trial off of antibiotics given consequences if things go wrong AGAIN  I would like to try a less risky Abx such as amoxicillin and monitor his WBC  For now he would like to continue on  oral levaquin 750mg  daily   Will have him come back to see me in 6 months.

## 2017-06-11 LAB — C-REACTIVE PROTEIN: CRP: 3.7 mg/L (ref <8.0)

## 2017-06-11 NOTE — Progress Notes (Deleted)
HPI: FU AVR; initially seen in Nov 2015 with "flu" that ended up being bacterial endocarditis. He underwent aortic root and valve replacement with fistula repair. At that time he also had CHB and had a MDT PTVDP placed. Jan 2016 he had symptoms of dyspnea and was re evaluated. A TEE was done and and revealed a large VSD with left to right shunting from LVOT. On 02/12/2014 he underwent Redo Median Sternotomy, Repair of a VSD, Tricuspid Valve Repair, and Redo closure of PFO.Patient had a repeat echocardiogram on 04/29/2014 that showed mild global reduction in LV function. There was a mobile mass on the aortic valve concerning for vegetation as well as mild to moderate aortic insufficiency. The patient was admitted and IV antibiotics resumed. Blood cultures remained negative. Transesophageal echocardiogram revealed low normal LV function and an aortic valve mass. 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 Patient was evaluated by Dr. Tyrone Sage and medical therapy recommended. Last echocardiogram March 2017 showed ejection fraction 40-45%.There was a bioprosthetic aortic valve with mean gradient 24 mmHg and mild aortic insufficiency There is prior tricuspid valve repair and prior VSD repair with no shunt. Since last seen,   Current Outpatient Medications  Medication Sig Dispense Refill  . aspirin EC 81 MG tablet Take 81 mg by mouth daily.    Marland Kitchen levofloxacin (LEVAQUIN) 750 MG tablet Take 1 tablet (750 mg total) by mouth daily. 30 tablet 11  . lidocaine (LIDODERM) 5 % Place 1 patch onto the skin daily. Remove & Discard patch within 12 hours or as directed by MD (Patient not taking: Reported on 06/10/2017) 6 patch 0  . metoprolol succinate (TOPROL-XL) 25 MG 24 hr tablet Take 1 tablet (25 mg total) by mouth daily. KEEP OV. 90 tablet 0   No current facility-administered medications for this visit.      Past Medical History:  Diagnosis Date  . AKI (acute kidney injury)  (HCC)   . Brain abscess   . Drug-induced leukopenia (HCC) 06/15/2014  . Endocarditis    related to meningitis   . Fracture of left femur (HCC)   . History of open heart surgery    December 30, 2013- ascending aortic root replacement, TEE  . Meningitis    Group B Strep (November 2015)  . moderate residual aortic insufficiency s/p allograft aortic root replacement 03/08/2014  . Obesity   . Patent foramen ovale    Closed during procedure on December 30, 2013  . Presence of permanent cardiac pacemaker 01/04/2014   PPM MEDTRONIC FOR COMPLETE HEART BLOCK  . 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 S/P aortic root and valve allograft 12/30/2013   Human allograft aortic root replacement with repair of aorta-right atrial fistula and tricuspid valve repair  . S/P placement of cardiac pacemaker 01/04/2014   Medtronic (serial number [id number]) dual chamber permanent pacemaker implanted by Dr Ladona Ridgel  . S/P redo patent foramen ovale closure 02/12/2014  . S/P redo tricuspid valve repair 02/12/2014   Complex valvuloplasty including patch closure of VSD with plication of septal leaflet and 26 mm Edwards mc3 ring annuloplasty  . S/P ventricular septal defect repair + redo tricuspid valve repair + redo closure PFO 02/12/2014   Redo sternotomy for bovine pericardial patch repair of large ventricular septal defect (recurrent LVOT to RA fistula due to bacterial endocarditis)  . Smokeless tobacco use   . Thrombocytopenia (HCC) 12/2013  . Tricuspid regurgitation 02/11/2014  . Ventricular septal defect 02/12/2014   Post-infectious  s/p repair of LVOT to RA fistula for bacterial endocarditis    Past Surgical History:  Procedure Laterality Date  . ASCENDING AORTIC ROOT REPLACEMENT N/A 12/30/2013   Procedure: ASCENDING AORTIC ROOT REPLACEMENT with 23mm HOMOGRAFT, CLOSURE OF AORTIC TO RIGHT ATRIAL FISTULA, CLOSURE OF PATENT FORAMEN OVALE;  Surgeon: Delight Ovens, MD;  Location: [facility] OR;  Service: Open Heart  Surgery;  Laterality: N/A;  . INSERT / REPLACE / REMOVE PACEMAKER  01/04/2014  . INTRAOPERATIVE TRANSESOPHAGEAL ECHOCARDIOGRAM N/A 12/30/2013   Procedure: INTRAOPERATIVE TRANSESOPHAGEAL ECHOCARDIOGRAM;  Surgeon: Delight Ovens, MD;  Location: [facility] OR;  Service: Open Heart Surgery;  Laterality: N/A;  . PERMANENT PACEMAKER INSERTION N/A 01/04/2014   Procedure: PERMANENT PACEMAKER INSERTION;  Surgeon: Marinus Maw, MD;  Location: [facility] CATH LAB;  Service: Cardiovascular;  Laterality: N/A;  . RIGHT HEART CATHETERIZATION N/A 02/12/2014   Procedure: RIGHT HEART CATH;  Surgeon: Laurey Morale, MD;  Location: [facility] CATH LAB;  Service: Cardiovascular;  Laterality: N/A;  . TEE WITHOUT CARDIOVERSION N/A 02/12/2014   Procedure: TRANSESOPHAGEAL ECHOCARDIOGRAM (TEE);  Surgeon: Quintella Reichert, MD;  Location: [facility] ENDOSCOPY;  Service: Cardiovascular;  Laterality: N/A;  . TEE WITHOUT CARDIOVERSION N/A 04/30/2014   Procedure: TRANSESOPHAGEAL ECHOCARDIOGRAM (TEE);  Surgeon: Jake Bathe, MD;  Location: [facility] ENDOSCOPY;  Service: Cardiovascular;  Laterality: N/A;  . TRICUSPID VALVE REPLACEMENT N/A 12/30/2013   Procedure: REPAIR OF TRICUSPID VALVE LEAFLET;  Surgeon: Delight Ovens, MD;  Location: [facility] OR;  Service: Open Heart Surgery;  Laterality: N/A;  . TRICUSPID VALVE REPLACEMENT N/A 02/12/2014   Procedure: TRICUSPID VALVE REPAIR, REDO MEDIAN STERNOTOMY, REPAIR OF VENTRICULAR SEPTAL DEFECT (RECURRENT LV OUTFLOW TRACT TO RIGHT ATRIAL FISTULA), REDO CLOSURE OF PATENT FORAMEN OVALE;  Surgeon: Purcell Nails, MD;  Location: [facility] OR;  Service: Open Heart Surgery;  Laterality: N/A;    Social History   Socioeconomic History  . Marital status: Single    Spouse name: Not on file  . Number of children: Not on file  . Years of education: Not on file  . Highest education level: Not on file  Occupational History  . Not on file  Social Needs  . Financial resource strain: Not on file  . Food insecurity:    Worry: Not on file     Inability: Not on file  . Transportation needs:    Medical: Not on file    Non-medical: Not on file  Tobacco Use  . Smoking status: Never Smoker  . Smokeless tobacco: Former Engineer, water and Sexual Activity  . Alcohol use: Yes    Alcohol/week: 0.0 oz    Comment: socially  . Drug use: No  . Sexual activity: Yes    Birth control/protection: Condom  Lifestyle  . Physical activity:    Days per week: Not on file    Minutes per session: Not on file  . Stress: Not on file  Relationships  . Social connections:    Talks on phone: Not on file    Gets together: Not on file    Attends religious service: Not on file    Active member of club or organization: Not on file    Attends meetings of clubs or organizations: Not on file    Relationship status: Not on file  . Intimate partner violence:    Fear of current or ex partner: Not on file    Emotionally abused: Not on file    Physically abused: Not on file    Forced sexual  activity: Not on file  Other Topics Concern  . Not on file  Social History Narrative  . Not on file    Family History  Problem Relation Age of Onset  . Heart murmur Brother     ROS: no fevers or chills, productive cough, hemoptysis, dysphasia, odynophagia, melena, hematochezia, dysuria, hematuria, rash, seizure activity, orthopnea, PND, pedal edema, claudication. Remaining systems are negative.  Physical Exam: Well-developed well-nourished in no acute distress.  Skin is warm and dry.  HEENT is normal.  Neck is supple.  Chest is clear to auscultation with normal expansion.  Cardiovascular exam is regular rate and rhythm.  Abdominal exam nontender or distended. No masses palpated. Extremities show no edema. neuro grossly intact  ECG- personally reviewed  A/P  1 status post aortic valve replacement-continue SBE prophylaxis.  Repeat echocardiogram.  2 history of mild cardiomyopathy-continue beta-blocker.  No congestive heart failure symptoms.  Repeat  echocardiogram.  3 prior pacemaker-followed by electrophysiology.  4 prior endocarditis with aortic valve replacement, VSD repair, tricuspid valve repair and closure of PFO-repeat echocardiogram.  Olga Millers, MD

## 2017-06-13 ENCOUNTER — Ambulatory Visit: Payer: 59 | Admitting: Cardiology

## 2017-07-18 ENCOUNTER — Encounter: Payer: Self-pay | Admitting: Internal Medicine

## 2017-07-18 ENCOUNTER — Ambulatory Visit: Payer: 59 | Admitting: Internal Medicine

## 2017-07-18 VITALS — BP 124/78 | HR 81 | Ht 65.0 in | Wt 248.0 lb

## 2017-07-18 DIAGNOSIS — Z950 Presence of cardiac pacemaker: Secondary | ICD-10-CM | POA: Diagnosis not present

## 2017-07-18 DIAGNOSIS — I442 Atrioventricular block, complete: Secondary | ICD-10-CM | POA: Diagnosis not present

## 2017-07-18 LAB — CUP PACEART INCLINIC DEVICE CHECK
Battery Impedance: 232 Ohm
Battery Remaining Longevity: 100 mo
Battery Voltage: 2.78 V
Brady Statistic AP VP Percent: 7 %
Date Time Interrogation Session: 20190606154904
Implantable Lead Implant Date: 20151123
Implantable Lead Location: 753860
Implantable Lead Model: 5076
Implantable Lead Model: 5076
Implantable Pulse Generator Implant Date: 20151123
Lead Channel Pacing Threshold Amplitude: 0.5 V
Lead Channel Pacing Threshold Pulse Width: 0.4 ms
Lead Channel Pacing Threshold Pulse Width: 0.4 ms
Lead Channel Sensing Intrinsic Amplitude: 2.8 mV
Lead Channel Setting Pacing Pulse Width: 0.4 ms
Lead Channel Setting Sensing Sensitivity: 4 mV
MDC IDC LEAD IMPLANT DT: 20151123
MDC IDC LEAD LOCATION: 753859
MDC IDC MSMT LEADCHNL RA IMPEDANCE VALUE: 411 Ohm
MDC IDC MSMT LEADCHNL RA PACING THRESHOLD AMPLITUDE: 0.5 V
MDC IDC MSMT LEADCHNL RV IMPEDANCE VALUE: 350 Ohm
MDC IDC MSMT LEADCHNL RV PACING THRESHOLD AMPLITUDE: 0.75 V
MDC IDC MSMT LEADCHNL RV PACING THRESHOLD AMPLITUDE: 0.875 V
MDC IDC MSMT LEADCHNL RV PACING THRESHOLD PULSEWIDTH: 0.4 ms
MDC IDC MSMT LEADCHNL RV PACING THRESHOLD PULSEWIDTH: 0.4 ms
MDC IDC SET LEADCHNL RA PACING AMPLITUDE: 1.5 V
MDC IDC SET LEADCHNL RV PACING AMPLITUDE: 2.5 V
MDC IDC STAT BRADY AP VS PERCENT: 0 %
MDC IDC STAT BRADY AS VP PERCENT: 93 %
MDC IDC STAT BRADY AS VS PERCENT: 0 %

## 2017-07-18 NOTE — Patient Instructions (Signed)
Medication Instructions:  Your physician recommends that you continue on your current medications as directed. Please refer to the Current Medication list given to you today.  Labwork: None ordered.  Testing/Procedures: None ordered.  Follow-Up: Your physician wants you to follow-up in: one year with Dr. Ladona Ridgel.   You will receive a reminder letter in the mail two months in advance. If you don't receive a letter, please call our office to schedule the follow-up appointment.  Remote monitoring is used to monitor your Pacemaker from home. This monitoring reduces the number of office visits required to check your device to one time per year. It allows [place] to keep an eye on the functioning of your device to ensure it is working properly. You are scheduled for a device check from home on 10/17/2017. You may send your transmission at any time that day. If you have a wireless device, the transmission will be sent automatically. After your physician reviews your transmission, you will receive a postcard with your next transmission date.  Any Other Special Instructions Will Be Listed Below (If Applicable).  If you need a refill on your cardiac medications before your next appointment, please call your pharmacy.

## 2017-07-18 NOTE — Progress Notes (Signed)
HPI Mr. Scannell returns today for pacemaker follow-up. He is a very pleasant 32 year old man with a complex past medical history including group B strep infection, resulting in endocarditis and a ventricular septal defect, who underwent surgical repair initially in 2015, complicated by complete heart block. He developed recurrent infection with dehiscent's of his repair and presented in 2016. He underwent revision and postoperatively had atrial fibrillation. He ultimately improved and recovered and has been maintained on a long course of intravenous antibiotic therapy. He denies fevers or chills. He is on oral Levaquin. He admits to some dietary indiscretion.  Allergies  Allergen Reactions  . Cefepime Other (See Comments)    Leukopenia not clear if due to vancomycin vs cefepime  . Vancomycin Other (See Comments)    ? Drug induced leukopenia vs being due to cefepime     Current Outpatient Medications  Medication Sig Dispense Refill  . aspirin EC 81 MG tablet Take 81 mg by mouth daily.    Marland Kitchen levofloxacin (LEVAQUIN) 750 MG tablet Take 1 tablet (750 mg total) by mouth daily. 30 tablet 11  . metoprolol succinate (TOPROL-XL) 25 MG 24 hr tablet Take 1 tablet (25 mg total) by mouth daily. KEEP OV. 90 tablet 0   No current facility-administered medications for this visit.      Past Medical History:  Diagnosis Date  . AKI (acute kidney injury) (HCC)   . Brain abscess   . Drug-induced leukopenia (HCC) 06/15/2014  . Endocarditis    related to meningitis   . Fracture of left femur (HCC)   . History of open heart surgery    December 30, 2013- ascending aortic root replacement, TEE  . Meningitis    Group B Strep (November 2015)  . moderate residual aortic insufficiency s/p allograft aortic root replacement 03/08/2014  . Obesity   . Patent foramen ovale    Closed during procedure on December 30, 2013  . Presence of permanent cardiac pacemaker 01/04/2014   PPM MEDTRONIC FOR COMPLETE HEART  BLOCK  . 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 S/P aortic root and valve allograft 12/30/2013   Human allograft aortic root replacement with repair of aorta-right atrial fistula and tricuspid valve repair  . S/P placement of cardiac pacemaker 01/04/2014   Medtronic (serial number [id number]) dual chamber permanent pacemaker implanted by Dr Ladona Ridgel  . S/P redo patent foramen ovale closure 02/12/2014  . S/P redo tricuspid valve repair 02/12/2014   Complex valvuloplasty including patch closure of VSD with plication of septal leaflet and 26 mm Edwards mc3 ring annuloplasty  . S/P ventricular septal defect repair + redo tricuspid valve repair + redo closure PFO 02/12/2014   Redo sternotomy for bovine pericardial patch repair of large ventricular septal defect (recurrent LVOT to RA fistula due to bacterial endocarditis)  . Smokeless tobacco use   . Thrombocytopenia (HCC) 12/2013  . Tricuspid regurgitation 02/11/2014  . Ventricular septal defect 02/12/2014   Post-infectious s/p repair of LVOT to RA fistula for bacterial endocarditis    ROS:   All systems reviewed and negative except as noted in the HPI.   Past Surgical History:  Procedure Laterality Date  . ASCENDING AORTIC ROOT REPLACEMENT N/A 12/30/2013   Procedure: ASCENDING AORTIC ROOT REPLACEMENT with 8mm HOMOGRAFT, CLOSURE OF AORTIC TO RIGHT ATRIAL FISTULA, CLOSURE OF PATENT FORAMEN OVALE;  Surgeon: Delight Ovens, MD;  Location: [facility] OR;  Service: Open Heart Surgery;  Laterality: N/A;  . INSERT / REPLACE / REMOVE PACEMAKER  01/04/2014  . INTRAOPERATIVE TRANSESOPHAGEAL ECHOCARDIOGRAM N/A 12/30/2013   Procedure: INTRAOPERATIVE TRANSESOPHAGEAL ECHOCARDIOGRAM;  Surgeon: Delight Ovens, MD;  Location: [facility] OR;  Service: Open Heart Surgery;  Laterality: N/A;  . PERMANENT PACEMAKER INSERTION N/A 01/04/2014   Procedure: PERMANENT PACEMAKER INSERTION;  Surgeon: Marinus Maw, MD;  Location: [facility] CATH LAB;  Service: Cardiovascular;   Laterality: N/A;  . RIGHT HEART CATHETERIZATION N/A 02/12/2014   Procedure: RIGHT HEART CATH;  Surgeon: Laurey Morale, MD;  Location: [facility] CATH LAB;  Service: Cardiovascular;  Laterality: N/A;  . TEE WITHOUT CARDIOVERSION N/A 02/12/2014   Procedure: TRANSESOPHAGEAL ECHOCARDIOGRAM (TEE);  Surgeon: Quintella Reichert, MD;  Location: [facility] ENDOSCOPY;  Service: Cardiovascular;  Laterality: N/A;  . TEE WITHOUT CARDIOVERSION N/A 04/30/2014   Procedure: TRANSESOPHAGEAL ECHOCARDIOGRAM (TEE);  Surgeon: Jake Bathe, MD;  Location: [facility] ENDOSCOPY;  Service: Cardiovascular;  Laterality: N/A;  . TRICUSPID VALVE REPLACEMENT N/A 12/30/2013   Procedure: REPAIR OF TRICUSPID VALVE LEAFLET;  Surgeon: Delight Ovens, MD;  Location: [facility] OR;  Service: Open Heart Surgery;  Laterality: N/A;  . TRICUSPID VALVE REPLACEMENT N/A 02/12/2014   Procedure: TRICUSPID VALVE REPAIR, REDO MEDIAN STERNOTOMY, REPAIR OF VENTRICULAR SEPTAL DEFECT (RECURRENT LV OUTFLOW TRACT TO RIGHT ATRIAL FISTULA), REDO CLOSURE OF PATENT FORAMEN OVALE;  Surgeon: Purcell Nails, MD;  Location: [facility] OR;  Service: Open Heart Surgery;  Laterality: N/A;     Family History  Problem Relation Age of Onset  . Heart murmur Brother      Social History   Socioeconomic History  . Marital status: Married    Spouse name: Not on file  . Number of children: Not on file  . Years of education: Not on file  . Highest education level: Not on file  Occupational History  . Not on file  Social Needs  . Financial resource strain: Not on file  . Food insecurity:    Worry: Not on file    Inability: Not on file  . Transportation needs:    Medical: Not on file    Non-medical: Not on file  Tobacco Use  . Smoking status: Never Smoker  . Smokeless tobacco: Former Engineer, water and Sexual Activity  . Alcohol use: Yes    Alcohol/week: 0.0 oz    Comment: socially  . Drug use: No  . Sexual activity: Yes    Birth control/protection: Condom  Lifestyle  . Physical  activity:    Days per week: Not on file    Minutes per session: Not on file  . Stress: Not on file  Relationships  . Social connections:    Talks on phone: Not on file    Gets together: Not on file    Attends religious service: Not on file    Active member of club or organization: Not on file    Attends meetings of clubs or organizations: Not on file    Relationship status: Not on file  . Intimate partner violence:    Fear of current or ex partner: Not on file    Emotionally abused: Not on file    Physically abused: Not on file    Forced sexual activity: Not on file  Other Topics Concern  . Not on file  Social History Narrative  . Not on file     BP 124/78   Pulse 81   Ht 5\' 5"  (1.651 m)   Wt 248 lb (112.5 kg)   BMI 41.27 kg/m   Physical Exam:  Well appearing  32 yo man, overweight, NAD HEENT: Unremarkable Neck:  6 cm JVD, no thyromegally Lymphatics:  No adenopathy Back:  No CVA tenderness Lungs:  Clear with no wheezes HEART:  Regular rate rhythm, no murmurs, no rubs, no clicks Abd:  soft, positive bowel sounds, no organomegally, no rebound, no guarding Ext:  2 plus pulses, no edema, no cyanosis, no clubbing Skin:  No rashes no nodules Neuro:  CN II through XII intact, motor grossly intact  EKG - NSR with pacing induced LBBB   DEVICE  Normal device function.  See PaceArt for details.   Assess/Plan: 1. CHB - he is s/p PPM and is asymptomatic. 2. VSD - he is s/p repair and has no evidence of residual. 3. PPM - his medtronic DDD PM is working normally. Will recheck in several months.  Leonia Reeves.D.

## 2017-08-02 ENCOUNTER — Other Ambulatory Visit: Payer: Self-pay | Admitting: Cardiology

## 2017-08-02 NOTE — Telephone Encounter (Signed)
REFILL 

## 2017-09-10 NOTE — Progress Notes (Signed)
HPI: FU AVR; initially seen in Nov 2015 with "flu" that ended up being bacterial endocarditis. He underwent aortic root and valve replacement with fistula repair. At that time he also had CHB and had a MDT PTVDP placed. Jan 2016 he had symptoms of dyspnea and was re evaluated. A TEE was done and and revealed a large VSD with left to right shunting from LVOT. On 02/12/2014 he underwent Redo Median Sternotomy, Repair of a VSD, Tricuspid Valve Repair, and Redo closure of PFO.Patient had a repeat echocardiogram on 04/29/2014 that showed mild global reduction in LV function. There was a mobile mass on the aortic valve concerning for vegetation as well as mild to moderate aortic insufficiency. The patient was admitted and IV antibiotics resumed. Blood cultures remained negative. Transesophageal echocardiogram revealed low normal LV function and an aortic valve mass. 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 Patient was evaluated by Dr. Tyrone Sage and medical therapy recommended. Last echocardiogram March 2017 showed ejection fraction 40-45%.There was a bioprosthetic aortic valve with mean gradient 24 mmHg and mild aortic insufficiency There is prior tricuspid valve repair and prior VSD repair with no shunt. Since last seen, the patient denies any dyspnea on exertion, orthopnea, PND, pedal edema, palpitations, syncope or chest pain.   Current Outpatient Medications  Medication Sig Dispense Refill  . aspirin EC 81 MG tablet Take 81 mg by mouth daily.    Marland Kitchen levofloxacin (LEVAQUIN) 750 MG tablet Take 1 tablet (750 mg total) by mouth daily. 30 tablet 11  . metoprolol succinate (TOPROL-XL) 25 MG 24 hr tablet Take 1 tablet (25 mg total) by mouth daily. 30 tablet 11   No current facility-administered medications for this visit.      Past Medical History:  Diagnosis Date  . AKI (acute kidney injury) (HCC)   . Aortic valve vegetation on echo 04/29/14 04/29/2014  . Brain abscess    . Congestive dilated cardiomyopathy (HCC) 06/23/2015  . Drug-induced leukopenia (HCC) 06/15/2014  . Endocarditis    related to meningitis   . Endocarditis of aortic valve-November 2015   . Fracture of left femur (HCC)   . History of open heart surgery    December 30, 2013- ascending aortic root replacement, TEE  . Meningitis    Group B Strep (November 2015)  . moderate residual aortic insufficiency s/p allograft aortic root replacement 03/08/2014  . Obesity   . Patent foramen ovale    Closed during procedure on December 30, 2013  . Presence of permanent cardiac pacemaker 01/04/2014   PPM MEDTRONIC FOR COMPLETE HEART BLOCK  . 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 S/P aortic root and valve allograft 12/30/2013   Human allograft aortic root replacement with repair of aorta-right atrial fistula and tricuspid valve repair  . S/P placement of cardiac pacemaker 01/04/2014   Medtronic (serial number [id number]) dual chamber permanent pacemaker implanted by Dr Ladona Ridgel  . S/P redo patent foramen ovale closure 02/12/2014  . S/P redo tricuspid valve repair 02/12/2014   Complex valvuloplasty including patch closure of VSD with plication of septal leaflet and 26 mm Edwards mc3 ring annuloplasty  . S/P ventricular septal defect repair + redo tricuspid valve repair + redo closure PFO 02/12/2014   Redo sternotomy for bovine pericardial patch repair of large ventricular septal defect (recurrent LVOT to RA fistula due to bacterial endocarditis)  . Smokeless tobacco use   . Third degree heart block (HCC) 12/31/2013  . Thrombocytopenia (HCC) 12/2013  .  Tricuspid regurgitation 02/11/2014  . Ventricular septal defect 02/12/2014   Post-infectious s/p repair of LVOT to RA fistula for bacterial endocarditis    Past Surgical History:  Procedure Laterality Date  . ASCENDING AORTIC ROOT REPLACEMENT N/A 12/30/2013   Procedure: ASCENDING AORTIC ROOT REPLACEMENT with 23mm HOMOGRAFT, CLOSURE OF AORTIC TO RIGHT  ATRIAL FISTULA, CLOSURE OF PATENT FORAMEN OVALE;  Surgeon: Delight Ovens, MD;  Location: [facility] OR;  Service: Open Heart Surgery;  Laterality: N/A;  . INSERT / REPLACE / REMOVE PACEMAKER  01/04/2014  . INTRAOPERATIVE TRANSESOPHAGEAL ECHOCARDIOGRAM N/A 12/30/2013   Procedure: INTRAOPERATIVE TRANSESOPHAGEAL ECHOCARDIOGRAM;  Surgeon: Delight Ovens, MD;  Location: [facility] OR;  Service: Open Heart Surgery;  Laterality: N/A;  . PERMANENT PACEMAKER INSERTION N/A 01/04/2014   Procedure: PERMANENT PACEMAKER INSERTION;  Surgeon: Marinus Maw, MD;  Location: [facility] CATH LAB;  Service: Cardiovascular;  Laterality: N/A;  . RIGHT HEART CATHETERIZATION N/A 02/12/2014   Procedure: RIGHT HEART CATH;  Surgeon: Laurey Morale, MD;  Location: [facility] CATH LAB;  Service: Cardiovascular;  Laterality: N/A;  . TEE WITHOUT CARDIOVERSION N/A 02/12/2014   Procedure: TRANSESOPHAGEAL ECHOCARDIOGRAM (TEE);  Surgeon: Quintella Reichert, MD;  Location: [facility] ENDOSCOPY;  Service: Cardiovascular;  Laterality: N/A;  . TEE WITHOUT CARDIOVERSION N/A 04/30/2014   Procedure: TRANSESOPHAGEAL ECHOCARDIOGRAM (TEE);  Surgeon: Jake Bathe, MD;  Location: [facility] ENDOSCOPY;  Service: Cardiovascular;  Laterality: N/A;  . TRICUSPID VALVE REPLACEMENT N/A 12/30/2013   Procedure: REPAIR OF TRICUSPID VALVE LEAFLET;  Surgeon: Delight Ovens, MD;  Location: [facility] OR;  Service: Open Heart Surgery;  Laterality: N/A;  . TRICUSPID VALVE REPLACEMENT N/A 02/12/2014   Procedure: TRICUSPID VALVE REPAIR, REDO MEDIAN STERNOTOMY, REPAIR OF VENTRICULAR SEPTAL DEFECT (RECURRENT LV OUTFLOW TRACT TO RIGHT ATRIAL FISTULA), REDO CLOSURE OF PATENT FORAMEN OVALE;  Surgeon: Purcell Nails, MD;  Location: [facility] OR;  Service: Open Heart Surgery;  Laterality: N/A;    Social History   Socioeconomic History  . Marital status: Married    Spouse name: Not on file  . Number of children: Not on file  . Years of education: Not on file  . Highest education level: Not on file  Occupational History    . Not on file  Social Needs  . Financial resource strain: Not on file  . Food insecurity:    Worry: Not on file    Inability: Not on file  . Transportation needs:    Medical: Not on file    Non-medical: Not on file  Tobacco Use  . Smoking status: Never Smoker  . Smokeless tobacco: Former Engineer, water and Sexual Activity  . Alcohol use: Yes    Alcohol/week: 0.0 oz    Comment: socially  . Drug use: No  . Sexual activity: Yes    Birth control/protection: Condom  Lifestyle  . Physical activity:    Days per week: Not on file    Minutes per session: Not on file  . Stress: Not on file  Relationships  . Social connections:    Talks on phone: Not on file    Gets together: Not on file    Attends religious service: Not on file    Active member of club or organization: Not on file    Attends meetings of clubs or organizations: Not on file    Relationship status: Not on file  . Intimate partner violence:    Fear of current or ex partner: Not on file    Emotionally abused: Not on file  Physically abused: Not on file    Forced sexual activity: Not on file  Other Topics Concern  . Not on file  Social History Narrative  . Not on file    Family History  Problem Relation Age of Onset  . Heart murmur Brother     ROS: no fevers or chills, productive cough, hemoptysis, dysphasia, odynophagia, melena, hematochezia, dysuria, hematuria, rash, seizure activity, orthopnea, PND, pedal edema, claudication. Remaining systems are negative.  Physical Exam: Well-developed well-nourished in no acute distress.  Skin is warm and dry.  Tattoos HEENT is normal.  Neck is supple.  Chest is clear to auscultation with normal expansion.  Cardiovascular exam is regular rate and rhythm.  2/6 systolic murmur left sternal border.  No diastolic murmur. Abdominal exam nontender or distended. No masses palpated. Extremities show no edema. neuro grossly intact  ECG- personally reviewed  A/P  1  history of cardiomyopathy with mild to moderate LV dysfunction-continue beta-blocker.  Repeat echocardiogram.  2 status post aortic valve/aortic root replacement-continue SBE prophylaxis.  Patient did have an aortic valve mobile density following procedure.  Repeat echocardiogram.  Patient continues to be followed by infectious disease.   3 status post VSD repair, tricuspid valve repair and PFO closure  4 status post pacemaker-followed by Dr. Ladona Ridgel.  Olga Millers, MD

## 2017-09-16 ENCOUNTER — Ambulatory Visit: Payer: 59 | Admitting: Cardiology

## 2017-09-16 ENCOUNTER — Encounter: Payer: Self-pay | Admitting: Cardiology

## 2017-09-16 VITALS — BP 108/80 | HR 71 | Ht 65.0 in | Wt 254.0 lb

## 2017-09-16 DIAGNOSIS — I330 Acute and subacute infective endocarditis: Secondary | ICD-10-CM

## 2017-09-16 DIAGNOSIS — I359 Nonrheumatic aortic valve disorder, unspecified: Secondary | ICD-10-CM | POA: Diagnosis not present

## 2017-09-16 NOTE — Patient Instructions (Signed)

## 2017-09-25 ENCOUNTER — Ambulatory Visit (HOSPITAL_COMMUNITY): Payer: 59 | Attending: Cardiology

## 2017-09-25 ENCOUNTER — Other Ambulatory Visit: Payer: Self-pay

## 2017-09-25 DIAGNOSIS — Z952 Presence of prosthetic heart valve: Secondary | ICD-10-CM | POA: Diagnosis not present

## 2017-09-25 DIAGNOSIS — Z6841 Body Mass Index (BMI) 40.0 and over, adult: Secondary | ICD-10-CM | POA: Diagnosis not present

## 2017-09-25 DIAGNOSIS — E669 Obesity, unspecified: Secondary | ICD-10-CM | POA: Diagnosis not present

## 2017-09-25 DIAGNOSIS — I359 Nonrheumatic aortic valve disorder, unspecified: Secondary | ICD-10-CM

## 2017-09-25 DIAGNOSIS — I351 Nonrheumatic aortic (valve) insufficiency: Secondary | ICD-10-CM | POA: Insufficient documentation

## 2017-09-25 DIAGNOSIS — Z720 Tobacco use: Secondary | ICD-10-CM | POA: Diagnosis not present

## 2017-09-25 MED ORDER — PERFLUTREN LIPID MICROSPHERE
1.0000 mL | INTRAVENOUS | Status: AC | PRN
  Administered 2017-09-25: 3 mL via INTRAVENOUS

## 2017-10-17 ENCOUNTER — Telehealth: Payer: Self-pay | Admitting: Cardiology

## 2017-10-17 ENCOUNTER — Ambulatory Visit (INDEPENDENT_AMBULATORY_CARE_PROVIDER_SITE_OTHER): Payer: 59 | Admitting: *Deleted

## 2017-10-17 DIAGNOSIS — I442 Atrioventricular block, complete: Secondary | ICD-10-CM | POA: Diagnosis not present

## 2017-10-17 NOTE — Telephone Encounter (Signed)
LMOVM reminding pt to send remote transmission.   

## 2017-10-18 ENCOUNTER — Encounter: Payer: Self-pay | Admitting: Cardiology

## 2017-10-18 NOTE — Progress Notes (Signed)
Remote pacemaker transmission.   

## 2017-11-12 LAB — CUP PACEART REMOTE DEVICE CHECK
Battery Voltage: 2.78 V
Brady Statistic AP VP Percent: 6 %
Brady Statistic AP VS Percent: 0 %
Brady Statistic AS VS Percent: 0 %
Implantable Lead Location: 753859
Implantable Lead Location: 753860
Lead Channel Impedance Value: 371 Ohm
Lead Channel Pacing Threshold Amplitude: 0.875 V
Lead Channel Pacing Threshold Pulse Width: 0.4 ms
Lead Channel Pacing Threshold Pulse Width: 0.4 ms
Lead Channel Setting Pacing Pulse Width: 0.4 ms
MDC IDC LEAD IMPLANT DT: 20151123
MDC IDC LEAD IMPLANT DT: 20151123
MDC IDC MSMT BATTERY IMPEDANCE: 257 Ohm
MDC IDC MSMT BATTERY REMAINING LONGEVITY: 98 mo
MDC IDC MSMT LEADCHNL RA IMPEDANCE VALUE: 391 Ohm
MDC IDC MSMT LEADCHNL RA PACING THRESHOLD AMPLITUDE: 0.5 V
MDC IDC PG IMPLANT DT: 20151123
MDC IDC SESS DTM: 20190906191849
MDC IDC SET LEADCHNL RA PACING AMPLITUDE: 1.5 V
MDC IDC SET LEADCHNL RV PACING AMPLITUDE: 2.5 V
MDC IDC SET LEADCHNL RV SENSING SENSITIVITY: 4 mV
MDC IDC STAT BRADY AS VP PERCENT: 94 %

## 2017-12-06 DIAGNOSIS — J029 Acute pharyngitis, unspecified: Secondary | ICD-10-CM | POA: Diagnosis not present

## 2017-12-06 DIAGNOSIS — Z23 Encounter for immunization: Secondary | ICD-10-CM | POA: Diagnosis not present

## 2017-12-10 ENCOUNTER — Ambulatory Visit: Payer: Self-pay | Admitting: Infectious Disease

## 2018-01-16 ENCOUNTER — Telehealth: Payer: Self-pay

## 2018-01-16 NOTE — Telephone Encounter (Signed)
Spoke with pt and reminded pt of remote transmission that is due today. Pt verbalized understanding.   

## 2018-01-21 ENCOUNTER — Encounter: Payer: Self-pay | Admitting: Cardiology

## 2018-05-08 ENCOUNTER — Telehealth: Payer: Self-pay | Admitting: Cardiology

## 2018-05-08 NOTE — Telephone Encounter (Signed)
Spoke with patient and he is concerned about him being higher risk given his history. Will forward to Dr Jens Som for review

## 2018-05-08 NOTE — Telephone Encounter (Signed)
I would consider him higher risk [name]

## 2018-05-08 NOTE — Telephone Encounter (Signed)
Pt has a pacemaker , and hx of heart surgeries. Pt wants to know if he is considered Immunocompromised with his heart conditions. 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 He is nervous about his overall health.

## 2018-05-09 NOTE — Telephone Encounter (Signed)
Advised patient, verbalized understanding  

## 2018-05-19 ENCOUNTER — Telehealth: Payer: Self-pay

## 2018-05-19 NOTE — Telephone Encounter (Signed)
I do not have something w re to him being a compromised immune system.  He did have an unusual infection and did have valvular heart disease but I do not think that  this will put him in a category analogous to someone with COPD or coronary artery disease

## 2018-05-19 NOTE — Telephone Encounter (Signed)
Patient called office today with concerns about Covid-19. Patient state that he works at [employer] office and is concerned he is high risk for Covid infection since his recent infection in his heart. Patient would like to know if Dr. Daiva Eves would be able to provide advise to lower infection. Also if patient should work from home for two weeks to avoid close contact with coworkers.   What to do if you are LOW RISK for COVID-19?  Reduce your risk of any infection by using the same precautions used for avoiding the common cold or flu:  Marland Kitchen Wash your hands often with soap and warm water for at least 20 seconds.  If soap and water are not readily available, use an alcohol-based hand sanitizer with at least 60% alcohol.  . If coughing or sneezing, cover your mouth and nose by coughing or sneezing into the elbow areas of your shirt or coat, into a tissue or into your sleeve (not your hands). . Avoid shaking hands with others and consider head nods or verbal greetings only. . Avoid touching your eyes, nose, or mouth with unwashed hands.  . Avoid close contact with people who are sick. . Avoid places or events with large numbers of people in one location, like concerts or sporting events. . Carefully consider travel plans you have or are making. . If you are planning any travel outside or inside the [place], visit the CDC's Travelers' Health webpage for the latest health notices. . If you have some symptoms but not all symptoms, continue to monitor at home and seek medical attention if your symptoms worsen. . If you are having a medical emergency, call 911.  Was able to give patient advise on how to lower infection. Will route message to MD to advise on additional precautions. Also to advise if patient should work from home. Lorenso Courier, [place]

## 2018-08-06 ENCOUNTER — Other Ambulatory Visit: Payer: Self-pay | Admitting: Cardiology

## 2018-08-06 ENCOUNTER — Other Ambulatory Visit: Payer: Self-pay | Admitting: Infectious Disease

## 2018-08-06 DIAGNOSIS — I330 Acute and subacute infective endocarditis: Secondary | ICD-10-CM

## 2018-08-07 ENCOUNTER — Telehealth: Payer: Self-pay | Admitting: *Deleted

## 2018-08-07 NOTE — Telephone Encounter (Signed)
Received refill request for levofloxacin in triage. Patient last seen 05/2017, Dr Tommy Medal wanted to see him in 6 months to consider changing antibiotic therapy. RN left message for patient asking him to call for an appointment asap.  Sent 30 day refill, notifying pharmacy that patient needs an appointment.  Sent [employer]. Landis Gandy, RN

## 2018-08-08 NOTE — Telephone Encounter (Signed)
Let's at least make a phone visit

## 2018-08-11 NOTE — Telephone Encounter (Signed)
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 Landis Gandy, RN

## 2018-08-11 NOTE — Telephone Encounter (Signed)
Okay very good thanks

## 2018-08-12 ENCOUNTER — Other Ambulatory Visit: Payer: Self-pay

## 2018-08-12 ENCOUNTER — Ambulatory Visit (INDEPENDENT_AMBULATORY_CARE_PROVIDER_SITE_OTHER): Payer: 59 | Admitting: Infectious Disease

## 2018-08-12 ENCOUNTER — Encounter: Payer: Self-pay | Admitting: Infectious Disease

## 2018-08-12 DIAGNOSIS — A401 Sepsis due to streptococcus, group B: Secondary | ICD-10-CM

## 2018-08-12 DIAGNOSIS — I330 Acute and subacute infective endocarditis: Secondary | ICD-10-CM | POA: Diagnosis not present

## 2018-08-12 DIAGNOSIS — T826XXD Infection and inflammatory reaction due to cardiac valve prosthesis, subsequent encounter: Secondary | ICD-10-CM

## 2018-08-12 DIAGNOSIS — Z950 Presence of cardiac pacemaker: Secondary | ICD-10-CM

## 2018-08-12 DIAGNOSIS — Z9889 Other specified postprocedural states: Secondary | ICD-10-CM

## 2018-08-12 DIAGNOSIS — A491 Streptococcal infection, unspecified site: Secondary | ICD-10-CM | POA: Diagnosis not present

## 2018-08-12 DIAGNOSIS — I358 Other nonrheumatic aortic valve disorders: Secondary | ICD-10-CM

## 2018-08-12 DIAGNOSIS — I442 Atrioventricular block, complete: Secondary | ICD-10-CM

## 2018-08-12 DIAGNOSIS — Z954 Presence of other heart-valve replacement: Secondary | ICD-10-CM

## 2018-08-12 DIAGNOSIS — I38 Endocarditis, valve unspecified: Secondary | ICD-10-CM

## 2018-08-12 DIAGNOSIS — Z8774 Personal history of (corrected) congenital malformations of heart and circulatory system: Secondary | ICD-10-CM

## 2018-08-12 DIAGNOSIS — R6520 Severe sepsis without septic shock: Secondary | ICD-10-CM

## 2018-08-12 MED ORDER — LEVOFLOXACIN 750 MG PO TABS: 750.0000 mg | ORAL_TABLET | Freq: Every day | ORAL | 8 refills | Status: DC

## 2018-08-12 NOTE — Progress Notes (Signed)
Virtual Visit via Telephone Note  I connected with [name] on 08/12/18 at 11:00 AM EDT by telephone and verified that I am speaking with the correct person using two identifiers.  Location: Patient: Home Provider: [facility]   I discussed the limitations, risks, security and privacy concerns of performing an evaluation and management service by telephone and the availability of in person appointments. I also discussed with the patient that there may be a patient responsible charge related to this service. The patient expressed understanding and agreed to proceed.   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 Valve [facility] [place] showed GBS by ribosomal 16S sequencing.   He has had trouble tolerating the high dose ceftriaxone and also completed 2 weeks of gentamicin. He was on Rocephin when I saw him on 02/02/14 and getting used to the abx but a few days later was found to have worsening dyspnea, heart failure change in heart sounds and was admitted and found  To have new Ventricular Septal Defect (recurrent LV outflow tract to right atrial fistula, Severe Tricuspid Regurgitatio, Recurrent Patent Foramen Ovale, and Acute Congestive Heart Failure.   He underwent :   Redo Median Sternotomy  Repair of Ventricular Septal Defect Double layer bovine pericardial patch closure  Tricuspid Valve Repair Complex Valvuloplasty including plication of septal leaflet Edwards mc3 Ring annuloplasty  On January 1st, 2016. [facility] [place] where PCR of Ribosome AGAIN WAS POSITIVE FOR GROUP B STREPTOCOCCUS.      NOTE  Pacemaker from first CT surgery had remained in place through 2nd CT surgery and he is Pacer dependent.   IN the interim he was treated withn Rocephin high dose along with 2 weeks of gentamicin which he  finished).  His repeat Echo showed stability of his postoperative repairs.  He was then on oral amoxicillin and without any symptoms. His fiance was resting her head on his chest and discovered a new murmur.  He was evaluated and had 2d echo showing  mobile vegetation measuring 1.3 x 1.7 cm seemingly attached to the bioprosthetic aortic valve,with mild to moderate AI on 04/29/14.  He was brought into the hospital and underwent TEE which read as showing:  The same large (1.5 x 1.1cm) mobile irregular bordered echodensity along the anterior aspect of the subvalvular apparatus of the bioprosthetic aortic valve in the left ventricular outflow tract consistent with vegetation. There is perivalvular abscess like pocket/space in the posterior region of aortic valve adjacent to intraatrial septum. There was mild regurgitation.  He had blood cultures taken on amoxicillin which were negative and he was broadened ultimately to vancomycin and cefepime.   He was followed closely by Cardiology and CT surgery.   Dr. Tyrone SageGerhardt reviewed the TEE and noted that the  Lesion was in the outflow tract of the left ventricle and not on the aortic homograph leaflets. He did not recommend repeat 3rd open heart surgery.  Patient had  been dc to home on IV vancomycin and cefepime.   We found that he then developed leukopenia, vancomycin was stopped, but it did not quickly resolve.  So we stopped cefepime as well. He was out of town so we bridged  him with high dose oral levaquin 750mg  daily until he could get back to [place] and he was switched to IV daptomycin which he has been on through his 7 week of his most recent round of abx therapy. TTE done in mid April  2106 shows that his vegetation has  shrunk in size.  We then switched him to high dose oral levaquin 750mg  daily  Since then repeat TTE done in July 2016 and read by Dr. Jens Som and Dr. Tyrone Sage and found to no longer have evidence of vegetation.  Another TTE done in November that did not show any evidence for vegeation  He has been on levaquin. I had not seen him since 2017 before seeing him again in April of 2019.  He has remained on levaquin since then.  I was concerned throughout this time period that PM could be a nidus of infection and have been reluctant to take him off of antibiotics though I had wished to change him to amoxicillin from levaquin to see how he would do on this with blood work.  He called in for refill of levaquin and we scheduled an e visit. He currently is working with [employer]. His wife works as Fish farm manager but is nto working at present.  He has no physical complaints today.   Observations/Objective:  Group B streptococcal  Aortic valve endocarditis and Aorto-Right Atrial fistula sp AVR, TV repair  repair of Aorto to Right atrial fistula, placement of PM due to 3rd degree heart block, then Ventricular Septal Defect (recurrent LV outflow tract to right atrial fistula, Severe Tricuspid Regurgitatio, Recurrent Patent Foramen Ovale,   Acute Congestive Heart Failure Sp Redo Median Sternotom, Repair of Ventricular Septal Defect, Double layer bovine pericardial patch closure, Tricuspid Valve Repair, Complex Valvuloplasty including plication of septal leaflet, Edwards mc3 Ring annuloplasty   Group B Streptococcus sp treatment outlined above on chronic levaquin for many years since then   Assessment and Plan:  Group B streptococcal  Aortic valve endocarditis and Aorto-Right Atrial fistula sp AVR, TV repair, repair of Aorto to Right atrial fistula, placement of PM due to 3rd degree heart block, then Ventricular Septal Defect (recurrent LV outflow tract to right atrial  fistula, Severe Tricuspid Regurgitatio, Recurrent Patent Foramen Ovale, and Acute Congestive Heart Failure. Sp Redo Median Sternotom, Repair of Ventricular Septal Defect, Double layer bovine pericardial patch closure, Tricuspid Valve Repair, Complex Valvuloplasty including plication of septal leaflet, Edwards mc3 Ring annuloplasty with Group B Streptococcus isolated from heart valve sp repeat IV rocephin x 6 weeks with 2 weeks of gentamicin, changed to oral amoxicillin but now yet again with evidence of large vegetation + possible abscess vs cavity. Blood cultures are NG on amoxicillin and he was broadened IV vancomycin cefepime with course complicated by leukopenia that resolved when vancomycin and then cefepime stopped, bridged with levaquin then to daptomycin for 7 weeks of therapy follwed by high dose levaquin with recent TTE not showing vegetation anymore in when last looked at.  At present I would prefer to keep him on levaquin and revisit change to amoxcillin vs trial off antimicrobials altogether in 6 months    Follow Up Instructions:    I discussed the assessment and treatment plan with the patient. The patient was provided an opportunity to ask questions and all were answered. The patient agreed with the plan and demonstrated an understanding of the instructions.   The patient was advised to call back or seek an in-person evaluation if  the symptoms worsen or if the condition fails to improve as anticipated.  I provided 21 minutes of non-face-to-face time during this encounter.   Alcide Evener, MD

## 2018-08-20 ENCOUNTER — Telehealth: Payer: Self-pay | Admitting: *Deleted

## 2018-08-20 NOTE — Telephone Encounter (Signed)
Unable to leave a message, no voicemail.  

## 2018-10-10 NOTE — Progress Notes (Signed)
HPI: FU AVR; initially seen in Nov 2015 with "flu" that ended up being bacterial endocarditis. He underwent aortic root and valve replacement with fistula repair. At that time he also had CHB and had a MDT PTVDP placed. Jan 2016 he had symptoms of dyspnea and was reevaluated. A TEE was done and and revealed a large VSD with left to right shunting from LVOT. On 02/12/2014 he underwent Redo Median Sternotomy, Repair of a VSD, Tricuspid Valve Repair, and Redo closure of PFO.Patient had a repeat echocardiogram on 04/29/2014 that showed mild global reduction in LV function. There was a mobile mass on the aortic valve concerning for vegetation as well as mild to moderate aortic insufficiency. The patient was admitted and IV antibiotics resumed. Blood cultures remained negative. Transesophageal echocardiogram revealed low normal LV function and an aortic valve mass. 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 Patient was evaluated by Dr. Tyrone Sage and medical therapy recommended. Last echocardiogram August 2019 showed ejection fraction 40 to 45%, bioprosthetic aortic valve with normal function, mean gradient 22 mmHg, mild aortic insufficiency, mild to moderate RV dysfunction, prior tricuspid valve repair with mean gradient 4 mmHg.  Also noted prior ASD closure device and VSD repair.  Since last seen, the patient denies any dyspnea on exertion, orthopnea, PND, pedal edema, palpitations, syncope or chest pain.   Current Outpatient Medications  Medication Sig Dispense Refill  . aspirin EC 81 MG tablet Take 81 mg by mouth daily.    Marland Kitchen levofloxacin (LEVAQUIN) 750 MG tablet Take 1 tablet (750 mg total) by mouth daily. 30 tablet 8  . metoprolol succinate (TOPROL-XL) 25 MG 24 hr tablet Take 1 tablet (25 mg total) by mouth daily. Office visit needed 30 tablet 1   No current facility-administered medications for this visit.      Past Medical History:  Diagnosis Date  . AKI (acute  kidney injury) (HCC)   . Aortic valve vegetation on echo 04/29/14 04/29/2014  . Brain abscess   . Congestive dilated cardiomyopathy (HCC) 06/23/2015  . Drug-induced leukopenia (HCC) 06/15/2014  . Endocarditis    related to meningitis   . Endocarditis of aortic valve-November 2015   . Fracture of left femur (HCC)   . History of open heart surgery    December 30, 2013- ascending aortic root replacement, TEE  . Meningitis    Group B Strep (November 2015)  . moderate residual aortic insufficiency s/p allograft aortic root replacement 03/08/2014  . Obesity   . Patent foramen ovale    Closed during procedure on December 30, 2013  . Presence of permanent cardiac pacemaker 01/04/2014   PPM MEDTRONIC FOR COMPLETE HEART BLOCK  . 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 S/P aortic root and valve allograft 12/30/2013   Human allograft aortic root replacement with repair of aorta-right atrial fistula and tricuspid valve repair  . S/P placement of cardiac pacemaker 01/04/2014   Medtronic (serial number [id number]) dual chamber permanent pacemaker implanted by Dr Ladona Ridgel  . S/P redo patent foramen ovale closure 02/12/2014  . S/P redo tricuspid valve repair 02/12/2014   Complex valvuloplasty including patch closure of VSD with plication of septal leaflet and 26 mm Edwards mc3 ring annuloplasty  . S/P ventricular septal defect repair + redo tricuspid valve repair + redo closure PFO 02/12/2014   Redo sternotomy for bovine pericardial patch repair of large ventricular septal defect (recurrent LVOT to RA fistula due to bacterial endocarditis)  . Smokeless tobacco use   .  Third degree heart block (HCC) 12/31/2013  . Thrombocytopenia (HCC) 12/2013  . Tricuspid regurgitation 02/11/2014  . Ventricular septal defect 02/12/2014   Post-infectious s/p repair of LVOT to RA fistula for bacterial endocarditis    Past Surgical History:  Procedure Laterality Date  . ASCENDING AORTIC ROOT REPLACEMENT N/A 12/30/2013    Procedure: ASCENDING AORTIC ROOT REPLACEMENT with 23mm HOMOGRAFT, CLOSURE OF AORTIC TO RIGHT ATRIAL FISTULA, CLOSURE OF PATENT FORAMEN OVALE;  Surgeon: Delight OvensEdward B Gerhardt, MD;  Location: [facility] OR;  Service: Open Heart Surgery;  Laterality: N/A;  . INSERT / REPLACE / REMOVE PACEMAKER  01/04/2014  . INTRAOPERATIVE TRANSESOPHAGEAL ECHOCARDIOGRAM N/A 12/30/2013   Procedure: INTRAOPERATIVE TRANSESOPHAGEAL ECHOCARDIOGRAM;  Surgeon: Delight OvensEdward B Gerhardt, MD;  [facility];  Service: Open Heart Surgery;  Laterality: N/A;  . PERMANENT PACEMAKER INSERTION N/A 01/04/2014   Procedure: PERMANENT PACEMAKER INSERTION;  Surgeon: Marinus MawGregg W Taylor, MD;  [facility];  Service: Cardiovascular;  Laterality: N/A;  . RIGHT HEART CATHETERIZATION N/A 02/12/2014   Procedure: RIGHT HEART CATH;  Surgeon: Laurey Moralealton S McLean, MD;  [facility];  Service: Cardiovascular;  Laterality: N/A;  . TEE WITHOUT CARDIOVERSION N/A 02/12/2014   Procedure: TRANSESOPHAGEAL ECHOCARDIOGRAM (TEE);  Surgeon: Quintella Reichertraci R Turner, MD;  [facility];  Service: Cardiovascular;  Laterality: N/A;  . TEE WITHOUT CARDIOVERSION N/A 04/30/2014   Procedure: TRANSESOPHAGEAL ECHOCARDIOGRAM (TEE);  Surgeon: Jake BatheMark C Skains, MD;  [facility];  Service: Cardiovascular;  Laterality: N/A;  . TRICUSPID VALVE REPLACEMENT N/A 12/30/2013   Procedure: REPAIR OF TRICUSPID VALVE LEAFLET;  Surgeon: Delight OvensEdward B Gerhardt, MD;  [facility];  Service: Open Heart Surgery;  Laterality: N/A;  . TRICUSPID VALVE REPLACEMENT N/A 02/12/2014   Procedure: TRICUSPID VALVE REPAIR, REDO MEDIAN STERNOTOMY, REPAIR OF VENTRICULAR SEPTAL DEFECT (RECURRENT LV OUTFLOW TRACT TO RIGHT ATRIAL FISTULA), REDO CLOSURE OF PATENT FORAMEN OVALE;  Surgeon: Purcell Nailslarence H Owen, MD;  Location: [facility] OR;  Service: Open Heart Surgery;  Laterality: N/A;    Social History   Socioeconomic History  . Marital status: Married    Spouse name: Not on file  . Number of children: Not on file  .  Years of education: Not on file  . Highest education level: Not on file  Occupational History  . Not on file  Social Needs  . Financial resource strain: Not on file  . Food insecurity    Worry: Not on file    Inability: Not on file  . Transportation needs    Medical: Not on file    Non-medical: Not on file  Tobacco Use  . Smoking status: Never Smoker  . Smokeless tobacco: Former Engineer, waterUser  Substance and Sexual Activity  . Alcohol use: Yes    Alcohol/week: 0.0 standard drinks    Comment: socially  . Drug use: No  . Sexual activity: Yes    Birth control/protection: Condom  Lifestyle  . Physical activity    Days per week: Not on file    Minutes per session: Not on file  . Stress: Not on file  Relationships  . Social Musicianconnections    Talks on phone: Not on file    Gets together: Not on file    Attends religious service: Not on file    Active member of club or organization: Not on file    Attends meetings of clubs or organizations: Not on file    Relationship status: Not on file  . Intimate partner violence    Fear of current or  ex partner: Not on file    Emotionally abused: Not on file    Physically abused: Not on file    Forced sexual activity: Not on file  Other Topics Concern  . Not on file  Social History Narrative  . Not on file    Family History  Problem Relation Age of Onset  . Heart murmur Brother     ROS: no fevers or chills, productive cough, hemoptysis, dysphasia, odynophagia, melena, hematochezia, dysuria, hematuria, rash, seizure activity, orthopnea, PND, pedal edema, claudication. Remaining systems are negative.  Physical Exam: Well-developed well-nourished in no acute distress.  Skin is warm and dry.  HEENT is normal.  Neck is supple.  Chest is clear to auscultation with normal expansion.  Cardiovascular exam is regular rate and rhythm.  2/6 systolic murmur, no diastolic murmur. Abdominal exam nontender or distended. No masses palpated. Extremities  show no edema. neuro grossly intact  ECG- Sinus with Vpacing; personally reviewed  A/P  1 status post aortic valve/aortic root replacement-plan to continue SBE prophylaxis.  Repeat echocardiogram.  He did have an aortic valve mobile density on prior echocardiogram.  He continues to be on antibiotics which is being managed by infectious disease.  2 history of mild to moderate LV dysfunction-we will continue with beta-blockade.  3 prior VSD repair, PFO closure and tricuspid valve repair-continue SBE prophylaxis.  4 prior pacemaker-followed by Dr. Lovena Le.  Kirk Ruths, MD

## 2018-10-13 ENCOUNTER — Other Ambulatory Visit: Payer: Self-pay

## 2018-10-13 ENCOUNTER — Ambulatory Visit: Payer: 59 | Admitting: Cardiology

## 2018-10-13 ENCOUNTER — Encounter: Payer: Self-pay | Admitting: Cardiology

## 2018-10-13 VITALS — BP 108/60 | HR 78 | Temp 97.3°F | Ht 65.0 in | Wt 256.0 lb

## 2018-10-13 DIAGNOSIS — Z950 Presence of cardiac pacemaker: Secondary | ICD-10-CM | POA: Diagnosis not present

## 2018-10-13 DIAGNOSIS — I420 Dilated cardiomyopathy: Secondary | ICD-10-CM | POA: Diagnosis not present

## 2018-10-13 DIAGNOSIS — I330 Acute and subacute infective endocarditis: Secondary | ICD-10-CM | POA: Diagnosis not present

## 2018-10-13 NOTE — Patient Instructions (Signed)
Medication Instructions:  NO CHANGE If you need a refill on your cardiac medications before your next appointment, please call your pharmacy.   Lab work: If you have labs (blood work) drawn today and your tests are completely normal, you will receive your results only by: . MyChart Message (if you have MyChart) OR . A paper copy in the mail If you have any lab test that is abnormal or we need to change your treatment, we will call you to review the results.  Testing/Procedures: Your physician has requested that you have an echocardiogram. Echocardiography is a painless test that uses sound waves to create images of your heart. It provides your doctor with information about the size and shape of your heart and how well your heart's chambers and valves are working. This procedure takes approximately one hour. There are no restrictions for this procedure.  [address]  Follow-Up: At [facility], you and your health needs are our priority.  As part of our continuing mission to provide you with exceptional heart care, we have created designated Provider Care Teams.  These Care Teams include your primary Cardiologist (physician) and Advanced Practice Providers (APPs -  Physician Assistants and Nurse Practitioners) who all work together to provide you with the care you need, when you need it. You will need a follow up appointment in 12 months.  Please call our office 2 months in advance to schedule this appointment.  You may see BRIAN CRENSHAW MD or one of the following Advanced Practice Providers on your designated Care Team:   Luke Kilroy, PA-C Krista Kroeger, PA-C . Callie Goodrich, PA-C     

## 2018-10-27 ENCOUNTER — Ambulatory Visit (HOSPITAL_COMMUNITY): Payer: 59 | Attending: Cardiology

## 2018-10-27 ENCOUNTER — Other Ambulatory Visit: Payer: Self-pay

## 2018-10-27 DIAGNOSIS — I330 Acute and subacute infective endocarditis: Secondary | ICD-10-CM | POA: Diagnosis not present

## 2018-10-27 MED ORDER — PERFLUTREN LIPID MICROSPHERE
1.0000 mL | INTRAVENOUS | Status: AC | PRN
  Administered 2018-10-27: 2 mL via INTRAVENOUS

## 2018-10-29 ENCOUNTER — Telehealth: Payer: Self-pay | Admitting: Cardiology

## 2018-10-29 NOTE — Telephone Encounter (Signed)
New message   Patient is returning all for echo results. Please call.

## 2018-10-29 NOTE — Telephone Encounter (Signed)
Called patient- advised of ECHO results. Patient verbalized understanding.

## 2018-11-01 ENCOUNTER — Other Ambulatory Visit: Payer: Self-pay | Admitting: Cardiology

## 2018-11-04 ENCOUNTER — Other Ambulatory Visit: Payer: Self-pay | Admitting: *Deleted

## 2018-11-04 DIAGNOSIS — I359 Nonrheumatic aortic valve disorder, unspecified: Secondary | ICD-10-CM

## 2018-11-06 ENCOUNTER — Telehealth: Payer: Self-pay | Admitting: Cardiology

## 2018-11-06 NOTE — Telephone Encounter (Signed)
New Message:     Wife said pt tested positive for COVID. She wants to know if there is anything he needs to do special since he is a Cardiac patient and and our on medicine.

## 2018-11-06 NOTE — Telephone Encounter (Signed)
Spoke with pt wife, the patient is having coughing and SOB. Advised nothing special to do but if the patient starts having difficulty breathing he needs to go to the ER.

## 2019-02-16 ENCOUNTER — Ambulatory Visit: Payer: 59 | Admitting: Infectious Disease

## 2019-04-05 ENCOUNTER — Inpatient Hospital Stay (HOSPITAL_COMMUNITY)
Admission: EM | Admit: 2019-04-05 | Discharge: 2019-04-07 | DRG: 243 | Disposition: A | Discharge status: Home / Self Care | Payer: 59 | Attending: Internal Medicine | Admitting: Internal Medicine

## 2019-04-05 ENCOUNTER — Other Ambulatory Visit: Payer: Self-pay

## 2019-04-05 ENCOUNTER — Emergency Department (HOSPITAL_COMMUNITY): Payer: 59

## 2019-04-05 ENCOUNTER — Encounter (HOSPITAL_COMMUNITY): Payer: Self-pay | Admitting: Physician Assistant

## 2019-04-05 DIAGNOSIS — T82110A Breakdown (mechanical) of cardiac electrode, initial encounter: Secondary | ICD-10-CM | POA: Diagnosis not present

## 2019-04-05 DIAGNOSIS — Z9114 Patient's other noncompliance with medication regimen: Secondary | ICD-10-CM | POA: Diagnosis not present

## 2019-04-05 DIAGNOSIS — R739 Hyperglycemia, unspecified: Secondary | ICD-10-CM | POA: Diagnosis present

## 2019-04-05 DIAGNOSIS — Z20822 Contact with and (suspected) exposure to covid-19: Secondary | ICD-10-CM | POA: Diagnosis present

## 2019-04-05 DIAGNOSIS — R001 Bradycardia, unspecified: Secondary | ICD-10-CM | POA: Diagnosis not present

## 2019-04-05 DIAGNOSIS — Z7982 Long term (current) use of aspirin: Secondary | ICD-10-CM

## 2019-04-05 DIAGNOSIS — R55 Syncope and collapse: Secondary | ICD-10-CM | POA: Diagnosis present

## 2019-04-05 DIAGNOSIS — I420 Dilated cardiomyopathy: Secondary | ICD-10-CM | POA: Diagnosis present

## 2019-04-05 DIAGNOSIS — Z950 Presence of cardiac pacemaker: Secondary | ICD-10-CM

## 2019-04-05 DIAGNOSIS — I442 Atrioventricular block, complete: Secondary | ICD-10-CM | POA: Diagnosis present

## 2019-04-05 DIAGNOSIS — Y712 Prosthetic and other implants, materials and accessory cardiovascular devices associated with adverse incidents: Secondary | ICD-10-CM | POA: Diagnosis present

## 2019-04-05 DIAGNOSIS — Z8774 Personal history of (corrected) congenital malformations of heart and circulatory system: Secondary | ICD-10-CM | POA: Diagnosis not present

## 2019-04-05 DIAGNOSIS — T82190A Other mechanical complication of cardiac electrode, initial encounter: Principal | ICD-10-CM | POA: Diagnosis present

## 2019-04-05 DIAGNOSIS — A491 Streptococcal infection, unspecified site: Secondary | ICD-10-CM | POA: Diagnosis present

## 2019-04-05 DIAGNOSIS — Z6841 Body Mass Index (BMI) 40.0 and over, adult: Secondary | ICD-10-CM

## 2019-04-05 DIAGNOSIS — Z792 Long term (current) use of antibiotics: Secondary | ICD-10-CM | POA: Diagnosis not present

## 2019-04-05 DIAGNOSIS — Z881 Allergy status to other antibiotic agents status: Secondary | ICD-10-CM | POA: Diagnosis not present

## 2019-04-05 DIAGNOSIS — I82B12 Acute embolism and thrombosis of left subclavian vein: Secondary | ICD-10-CM | POA: Diagnosis present

## 2019-04-05 DIAGNOSIS — R0602 Shortness of breath: Secondary | ICD-10-CM | POA: Diagnosis not present

## 2019-04-05 DIAGNOSIS — Z954 Presence of other heart-valve replacement: Secondary | ICD-10-CM

## 2019-04-05 DIAGNOSIS — I358 Other nonrheumatic aortic valve disorders: Secondary | ICD-10-CM | POA: Diagnosis present

## 2019-04-05 DIAGNOSIS — T82118A Breakdown (mechanical) of other cardiac electronic device, initial encounter: Secondary | ICD-10-CM

## 2019-04-05 LAB — RESPIRATORY PANEL BY RT PCR (FLU A&B, COVID)
Influenza A by PCR: NEGATIVE
Influenza B by PCR: NEGATIVE
SARS Coronavirus 2 by RT PCR: NEGATIVE

## 2019-04-05 LAB — BASIC METABOLIC PANEL
Anion gap: 11 (ref 5–15)
BUN: 18 mg/dL (ref 6–20)
CO2: 21 mmol/L — ABNORMAL LOW (ref 22–32)
Calcium: 8.9 mg/dL (ref 8.9–10.3)
Chloride: 107 mmol/L (ref 98–111)
Creatinine, Ser: 1.26 mg/dL — ABNORMAL HIGH (ref 0.61–1.24)
GFR calc Af Amer: >60 mL/min (ref >60)
GFR calc non Af Amer: >60 mL/min (ref >60)
Glucose, Bld: 124 mg/dL — ABNORMAL HIGH (ref 70–99)
Potassium: 4.4 mmol/L (ref 3.5–5.1)
Sodium: 139 mmol/L (ref 135–145)

## 2019-04-05 LAB — CBC
HCT: 46.5 % (ref 39.0–52.0)
Hemoglobin: 15.3 g/dL (ref 13.0–17.0)
MCH: 30.2 pg (ref 26.0–34.0)
MCHC: 32.9 g/dL (ref 30.0–36.0)
MCV: 91.7 fL (ref 80.0–100.0)
Platelets: 187 10*3/uL (ref 150–400)
RBC: 5.07 MIL/uL (ref 4.22–5.81)
RDW: 12.8 % (ref 11.5–15.5)
WBC: 6.1 10*3/uL (ref 4.0–10.5)
nRBC: 0 % (ref 0.0–0.2)

## 2019-04-05 LAB — SURGICAL PCR SCREEN
MRSA, PCR: NEGATIVE
Staphylococcus aureus: NEGATIVE

## 2019-04-05 MED ORDER — ONDANSETRON HCL 4 MG/2ML IJ SOLN: 4.0000 mg | Freq: Four times a day (QID) | INTRAMUSCULAR | Status: DC | PRN

## 2019-04-05 MED ORDER — LEVOFLOXACIN 750 MG PO TABS
750.0000 mg | ORAL_TABLET | Freq: Every day | ORAL | Status: DC
  Administered 2019-04-05 – 2019-04-07 (×3): 750 mg via ORAL
  Filled 2019-04-05 (×3): qty 1

## 2019-04-05 MED ORDER — SODIUM CHLORIDE 0.9 % IV SOLN
80.0000 mg | INTRAVENOUS | Status: DC
  Filled 2019-04-05: qty 2

## 2019-04-05 MED ORDER — VANCOMYCIN HCL 1500 MG/300ML IV SOLN
1500.0000 mg | INTRAVENOUS | Status: DC
  Filled 2019-04-05: qty 300

## 2019-04-05 MED ORDER — SODIUM CHLORIDE 0.9 % IV SOLN: INTRAVENOUS | Status: DC

## 2019-04-05 MED ORDER — SODIUM CHLORIDE 0.9% FLUSH: 3.0000 mL | INTRAVENOUS | Status: DC | PRN

## 2019-04-05 MED ORDER — ACETAMINOPHEN 325 MG PO TABS: 650.0000 mg | ORAL_TABLET | ORAL | Status: DC | PRN

## 2019-04-05 MED ORDER — SODIUM CHLORIDE 0.9% FLUSH
3.0000 mL | Freq: Two times a day (BID) | INTRAVENOUS | Status: DC
  Administered 2019-04-05 – 2019-04-06 (×2): 3 mL via INTRAVENOUS

## 2019-04-05 MED ORDER — SODIUM CHLORIDE 0.9 % IV SOLN: 250.0000 mL | INTRAVENOUS | Status: DC | PRN

## 2019-04-05 NOTE — ED Notes (Signed)
cardiology at bedside

## 2019-04-05 NOTE — ED Notes (Signed)
Pacemaker integrated

## 2019-04-05 NOTE — ED Provider Notes (Addendum)
[facility] EMERGENCY DEPARTMENT Provider Note   CSN: [id number] Arrival date & time: 04/05/19  0720     History Chief Complaint  Patient presents with  . Near Syncope    pacemaker problems  . Shortness of Breath    [name] is a 34 y.o. male.  HPI     34 yo male s/p endocarditis and valve replacements 2015 presents today with dyspnea and lightheadedness 5 second pause noted on prehospital strip.  Patient states well until awoke in middle of night short of breath.  Resolved, then felt light headed and dyspneic this am going to work which required him to pull over and call EMS.  Above noted on their prehospital strip.  IV acces but no other intervention.  No chest pain or cough.  Patient had covid about 6 months ago.  He has not been taking his meds and reports he is supposed to be taking beta blocker and levaquin.  Denies chest pain, fever, nausea, vomiting, diarrhea, uri sxs.  He did feel a flushing sensation from top of head down when he had symptoms earlier.  Currently without complaints on stretcher.   Past Medical History:  Diagnosis Date  . AKI (acute kidney injury) (HCC)   . Aortic valve vegetation on echo 04/29/14 04/29/2014  . Brain abscess   . Congestive dilated cardiomyopathy (HCC) 06/23/2015  . Drug-induced leukopenia (HCC) 06/15/2014  . Endocarditis    related to meningitis   . Endocarditis of aortic valve-November 2015   . Fracture of left femur (HCC)   . History of open heart surgery    December 30, 2013- ascending aortic root replacement, TEE  . Meningitis    Group B Strep (November 2015)  . moderate residual aortic insufficiency s/p allograft aortic root replacement 03/08/2014  . Obesity   . Patent foramen ovale    Closed during procedure on December 30, 2013  . Presence of permanent cardiac pacemaker 01/04/2014   PPM MEDTRONIC FOR COMPLETE HEART BLOCK  . 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 S/P aortic root and valve allograft  12/30/2013   Human allograft aortic root replacement with repair of aorta-right atrial fistula and tricuspid valve repair  . S/P placement of cardiac pacemaker 01/04/2014   Medtronic (serial number [id number]) dual chamber permanent pacemaker implanted by Dr Ladona Ridgel  . S/P redo patent foramen ovale closure 02/12/2014  . S/P redo tricuspid valve repair 02/12/2014   Complex valvuloplasty including patch closure of VSD with plication of septal leaflet and 26 mm Edwards mc3 ring annuloplasty  . S/P ventricular septal defect repair + redo tricuspid valve repair + redo closure PFO 02/12/2014   Redo sternotomy for bovine pericardial patch repair of large ventricular septal defect (recurrent LVOT to RA fistula due to bacterial endocarditis)  . Smokeless tobacco use   . Third degree heart block (HCC) 12/31/2013  . Thrombocytopenia (HCC) 12/2013  . Tricuspid regurgitation 02/11/2014  . Ventricular septal defect 02/12/2014   Post-infectious s/p repair of LVOT to RA fistula for bacterial endocarditis    Patient Active Problem List   Diagnosis Date Noted  . Congestive dilated cardiomyopathy (HCC) 06/23/2015  . Pedal edema 05/12/2015  . 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 Drug-induced leukopenia (HCC) 06/15/2014  . Endocarditis   . Aortic valve vegetation on echo 04/29/14 04/29/2014  . Lower back pain 03/10/2014  . residual aortic insufficiency s/p allograft aortic root replacement 03/08/2014  . Group B streptococcal infection   . S/P ventricular septal defect  repair + redo tricuspid valve repair + redo closure PFO 02/12/2014  . S/P redo tricuspid valve repair 02/12/2014  . S/P redo patent foramen ovale closure 02/12/2014  . Dyspnea   . Pacemaker- MDT Nov 2015 02/11/2014  . Dyspnea on exertion 02/11/2014  . S/P placement of cardiac pacemaker 01/04/2014  . Third degree heart block ([place]) 12/31/2013  . Endocarditis of aortic valve-November 2015   . S/P aortic root and valve allograft Nov  2015with re do surgery Jan 1st 2016 12/30/2013  . Bacterial endocarditis   . Bilateral edema of lower extremity   . Sepsis due to group B Streptococcus ([place])   . LFT elevation   . Headache   . Pain in joint, lower leg   . Dehydration     Past Surgical History:  Procedure Laterality Date  . ASCENDING AORTIC ROOT REPLACEMENT N/A 12/30/2013   Procedure: ASCENDING AORTIC ROOT REPLACEMENT with 35mm HOMOGRAFT, CLOSURE OF AORTIC TO RIGHT ATRIAL FISTULA, CLOSURE OF PATENT FORAMEN OVALE;  Surgeon: Grace Isaac, MD;  Location: [place];  Service: Open Heart Surgery;  Laterality: N/A;  . INSERT / REPLACE / REMOVE PACEMAKER  01/04/2014  . INTRAOPERATIVE TRANSESOPHAGEAL ECHOCARDIOGRAM N/A 12/30/2013   Procedure: INTRAOPERATIVE TRANSESOPHAGEAL ECHOCARDIOGRAM;  Surgeon: Grace Isaac, MD;  Location: [place];  Service: Open Heart Surgery;  Laterality: N/A;  . PERMANENT PACEMAKER INSERTION N/A 01/04/2014   Procedure: PERMANENT PACEMAKER INSERTION;  Surgeon: Evans Lance, MD;  Location: [facility] CATH LAB;  Service: Cardiovascular;  Laterality: N/A;  . RIGHT HEART CATHETERIZATION N/A 02/12/2014   Procedure: RIGHT HEART CATH;  Surgeon: Larey Dresser, MD;  Location: [facility] CATH LAB;  Service: Cardiovascular;  Laterality: N/A;  . TEE WITHOUT CARDIOVERSION N/A 02/12/2014   Procedure: TRANSESOPHAGEAL ECHOCARDIOGRAM (TEE);  Surgeon: Sueanne Margarita, MD;  Location: [facility] ENDOSCOPY;  Service: Cardiovascular;  Laterality: N/A;  . TEE WITHOUT CARDIOVERSION N/A 04/30/2014   Procedure: TRANSESOPHAGEAL ECHOCARDIOGRAM (TEE);  Surgeon: Jerline Pain, MD;  Location: [place];  Service: Cardiovascular;  Laterality: N/A;  . TRICUSPID VALVE REPLACEMENT N/A 12/30/2013   Procedure: REPAIR OF TRICUSPID VALVE LEAFLET;  Surgeon: Grace Isaac, MD;  Location: [place];  Service: Open Heart Surgery;  Laterality: N/A;  . TRICUSPID VALVE REPLACEMENT N/A 02/12/2014   Procedure: TRICUSPID VALVE REPAIR, REDO MEDIAN STERNOTOMY, REPAIR OF  VENTRICULAR SEPTAL DEFECT (RECURRENT LV OUTFLOW TRACT TO RIGHT ATRIAL FISTULA), REDO CLOSURE OF PATENT FORAMEN OVALE;  Surgeon: Rexene Alberts, MD;  Location: [place];  Service: Open Heart Surgery;  Laterality: N/A;       Family History  Problem Relation Age of Onset  . Heart murmur Brother     Social History   Tobacco Use  . Smoking status: Never Smoker  . Smokeless tobacco: Former Network engineer Use Topics  . Alcohol use: Yes    Alcohol/week: 0.0 standard drinks    Comment: socially  . Drug use: No    Home Medications Prior to Admission medications   Medication Sig Start Date End Date Taking? Authorizing Provider  aspirin EC 81 MG tablet Take 81 mg by mouth daily.    [provider]  levofloxacin (LEVAQUIN) 750 MG tablet Take 1 tablet (750 mg total) by mouth daily. 08/12/18   Truman Hayward, MD  metoprolol succinate (TOPROL-XL) 25 MG 24 hr tablet TAKE ONE TABLET BY MOUTH DAILY 11/03/18   Lelon Perla, MD    Allergies    Cefepime and Vancomycin  Review of Systems  Review of Systems  All other systems reviewed and are negative.   Physical Exam Updated Vital Signs BP 113/72   Pulse 92   Temp 98.1 F (36.7 C) (Oral)   Resp 15   Ht 1.651 m (5\' 5" )   Wt 111.1 kg   SpO2 97%   BMI 40.77 kg/m   Physical Exam Vitals and nursing note reviewed.  Constitutional:      General: He is not in acute distress.    Appearance: He is well-developed. He is obese. He is not ill-appearing.  HENT:     Head: Normocephalic and atraumatic.     Mouth/Throat:     Mouth: Mucous membranes are moist.     Pharynx: Oropharynx is clear.  Eyes:     Extraocular Movements: Extraocular movements intact.     Pupils: Pupils are equal, round, and reactive to light.  Cardiovascular:     Rate and Rhythm: Normal rate and regular rhythm.  Pulmonary:     Effort: Pulmonary effort is normal.     Breath sounds: Normal breath sounds.  Chest:     Comments: Left chest pacemaker  pocket, nttp, no swelling or redness no palpable abnormality Abdominal:     General: Bowel sounds are normal.     Palpations: Abdomen is soft.  Musculoskeletal:        General: Normal range of motion.     Cervical back: Normal range of motion and neck supple.  Skin:    General: Skin is warm.  Neurological:     General: No focal deficit present.     Mental Status: He is alert and oriented to person, place, and time.     Cranial Nerves: No cranial nerve deficit.  Psychiatric:        Mood and Affect: Mood normal.        Behavior: Behavior normal.     ED Results / Procedures / Treatments   Labs (all labs ordered are listed, but only abnormal results are displayed) Labs Reviewed  RESPIRATORY PANEL BY RT PCR (FLU A&B, COVID)  CBC  BASIC METABOLIC PANEL    EKG EKG Interpretation  Date/Time:  Sunday April 05 2019 07:25:37 EST Ventricular Rate:  87 PR Interval:    QRS Duration: 143 QT Interval:  509 QTC Calculation: 613 R Axis:   30 Text Interpretation: VENTRICULAR PACED RHYTHM with episodes of capture failure Confirmed by [id number] [phone]) on 04/05/2019 7:50:22 AM   Radiology DG Chest Port 1 View  Result Date: 04/05/2019 CLINICAL DATA:  Dyspnea.  Prior aortic valve replacement. EXAM: PORTABLE CHEST 1 VIEW COMPARISON:  04/30/2014 FINDINGS: Sternotomy wires and left-sided pacemaker unchanged. Lungs are adequately inflated without focal airspace consolidation or effusion. Borderline stable cardiomegaly. Remainder of the exam is unchanged. IMPRESSION: No acute cardiopulmonary disease. Electronically Signed   By: 05/02/2014 M.D.   On: 04/05/2019 08:06    Procedures .Critical Care Performed by: 04/07/2019, MD Authorized by: Margarita Grizzle, MD   Critical care provider statement:    Critical care time (minutes):  45   Critical care end time:  04/05/2019 8:49 AM   Critical care was necessary to treat or prevent imminent or life-threatening deterioration of the  following conditions:  Circulatory failure   Critical care was time spent personally by me on the following activities:  Discussions with consultants, evaluation of patient's response to treatment, examination of patient, ordering and performing treatments and interventions, ordering and review of laboratory studies, ordering and review of radiographic studies,  pulse oximetry, re-evaluation of patient's condition, obtaining history from patient or surrogate and review of old charts   (including critical care time)  Medications Ordered in ED Medications - No data to display  ED Course  I have reviewed the triage vital signs and the nursing notes.  Pertinent labs & imaging results that were available during my care of the patient were reviewed by me and considered in my medical decision making (see chart for details).  Clinical Course as of Apr 04 850  Sun Apr 05, 2019  [id number] reviewed   [DR]  0851 Labs, cxr, ekg   [DR]    Clinical Course User Index [DR] Margarita Grizzle, MD   MDM Rules/Calculators/A&P                     Patient with pacemaker capture failure  I have reviewed the patient's medical history in detail  Labs and x-Rolen Conger reviewed personally Dr. Diona Browner paged and discussed at [phone].   Discussed with [employer] rep Tresa Endo at [phone] She will send rep to bedside Dr. Graciela Husbands at bedside,  Blood cultures added per cards Patient currently capturing and hemodynamically stable.  Final Clinical Impression(s) / ED Diagnoses Final diagnoses:  Pacemaker failure, initial encounter    Rx / DC Orders ED Discharge Orders    None       Margarita Grizzle, MD 04/05/19 1517    Margarita Grizzle, MD 04/05/19 [id number]

## 2019-04-05 NOTE — H&P (Addendum)
Cardiology History & Physical    Patient ID: [name] MRN: [id number]; DOB: March 18, 1985   Admission date: 04/05/2019  Primary Care Provider: [name], MD Primary Cardiologist: Olga Millers, MD  Primary Electrophysiologist:  [name], MD   Chief Complaint:  Shortness of breath, dizziness  Patient Profile:   [name] is a 34 y.o. male with bacterial endocarditis in 12/2013 s/p aortic root and valve replacement with fistula repair, CHB s/p Medtronic PPM 12/2013, VSD with shunting s/p Median Sternotomy, Repair of a VSD, Tricuspid Valve Repair, and Redo closure of PFO in 2016, prior thrombocytopenia, morbid obesity, congestive dilated cardiomyopathy EF 45-50% who presented to [facility] with dizziness, SOB, and intermittent prolonged pauses on telemetry.  History of Present Illness:   To recap cardiac history, he was initially seen in November 2015 with flu symptoms that ended up being group B strep meningitis with subsequent bacterial endocarditis of the aortic and tricuspid valve. He underwent aortic root and valve replacement with fistula repair. At that time he also had CHB and had a MDT PTVDP placed. In January 2016 he had symptoms of dyspnea and was reevaluated. A TEE was done and and he was ultimately found to have a large VSD with left to right shunt from LV outflow tract to right atrium due to dehiscence of previous repair and proximal suture line of human allograft aortic root graft. On 02/12/2014 he underwent Redo Median Sternotomy, Repair of a VSD, Tricuspid Valve Repair, and Redo closure of PFO.Repeat echo 04/2014 showed mild global reduction in LV function. There was a mobile mass on the aortic valve concerning for vegetation as well as mild to moderate aortic insufficiency. The patient was admitted and IV antibiotics were resumed. Blood cultures remained negative. Transesophageal echocardiogram revealed low normal LV function and an aortic valve mass. 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 Patient was evaluated by Dr. Tyrone Sage and medical therapy recommended. He has followed with ID. His last echo in 10/2018 showed EF 45-50%, LBBB, diffuse HK, VSD present with residual VSD L->R flow, mild AI, mild-moderate AS, normal aorta -> Dr. Jens Som reviewed personally and felt no obvious residual VSD. Repeat echo was suggested for 10/2019. It does not appear he has had pacer follow-up in quite some time, last interrogation 10/2017. Intermittent no-shows cancellations/noted.   He woke up overnight to go to the bathroom and noticed he was SOB on exertion out of proportion to activity. 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 While driving to work he felt very lightheaded/dizzy. He recently joined a gym 2-3 weeks ago. No recent trauma to the chest. Otherwise has been in USOH. He has not been taking his medication recently due to not remembering to take it. He took one dose of Levaquin on Friday but otherwise has not been taking it the last month. He has not been taking metoprolol or aspirin either.  Past Medical History:  Diagnosis Date  . AKI (acute kidney injury) (HCC)   . Aortic valve vegetation on echo 04/29/14 04/29/2014   managed medically  . Brain abscess   . Congestive dilated cardiomyopathy (HCC) 06/23/2015  . Drug-induced leukopenia (HCC) 06/15/2014  . Endocarditis of aortic valve-November 2015   . Fracture of left femur (HCC)   . History of open heart surgery    December 30, 2013- ascending aortic root replacement, TEE  . Meningitis  Group B Strep (November 2015)  . Obesity   . Patent foramen ovale    Closed during procedure on December 30, 2013  . Presence of permanent cardiac pacemaker 01/04/2014   PPM MEDTRONIC FOR COMPLETE HEART BLOCK  . 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 S/P placement of cardiac pacemaker 01/04/2014   Medtronic (serial number [id number]) dual chamber permanent pacemaker implanted by Dr Ladona Ridgel  . S/P redo patent foramen ovale closure 02/12/2014  . S/P redo tricuspid valve repair 02/12/2014   Complex valvuloplasty including patch closure of VSD with plication of septal leaflet and 26 mm Edwards mc3 ring annuloplasty  . S/P ventricular septal defect repair + redo tricuspid valve repair + redo closure PFO 02/12/2014   Redo sternotomy for bovine pericardial patch repair of large ventricular septal defect (recurrent LVOT to RA fistula due to bacterial endocarditis)  . Smokeless tobacco use   . Third degree heart block (HCC) 12/31/2013  . Thrombocytopenia (HCC) 12/2013  . Tricuspid regurgitation 02/11/2014  . Ventricular septal defect 02/12/2014   Post-infectious s/p repair of LVOT to RA fistula for bacterial endocarditis    Past Surgical History:  Procedure Laterality Date  . ASCENDING AORTIC ROOT REPLACEMENT N/A 12/30/2013   Procedure: ASCENDING AORTIC ROOT REPLACEMENT with 43mm HOMOGRAFT, CLOSURE OF AORTIC TO RIGHT ATRIAL FISTULA, CLOSURE OF PATENT FORAMEN OVALE;  Surgeon: Delight Ovens, MD;  Location: [facility] OR;  Service: Open Heart Surgery;  Laterality: N/A;  . INSERT / REPLACE / REMOVE PACEMAKER  01/04/2014  . INTRAOPERATIVE TRANSESOPHAGEAL ECHOCARDIOGRAM N/A 12/30/2013   Procedure: INTRAOPERATIVE TRANSESOPHAGEAL ECHOCARDIOGRAM;  Surgeon: Delight Ovens, MD;  Location: [facility] OR;  Service: Open Heart Surgery;  Laterality: N/A;  . PERMANENT PACEMAKER INSERTION N/A 01/04/2014   Procedure: PERMANENT PACEMAKER INSERTION;  Surgeon: Marinus Maw, MD;  Location[facility] CATH LAB;  Service: Cardiovascular;  Laterality: N/A;  . RIGHT HEART CATHETERIZATION N/A 02/12/2014   Procedure: RIGHT HEART CATH;  Surgeon: Laurey Morale, MD;  Location: [facility] CATH LAB;   Service: Cardiovascular;  Laterality: N/A;  . TEE WITHOUT CARDIOVERSION N/A 02/12/2014   Procedure: TRANSESOPHAGEAL ECHOCARDIOGRAM (TEE);  Surgeon: Quintella Reichert, MD;  Location: [facility] ENDOSCOPY;  Service: Cardiovascular;  Laterality: N/A;  . TEE WITHOUT CARDIOVERSION N/A 04/30/2014   Procedure: TRANSESOPHAGEAL ECHOCARDIOGRAM (TEE);  Surgeon: Jake Bathe, MD;  Location: [facility] ENDOSCOPY;  Service: Cardiovascular;  Laterality: N/A;  . TRICUSPID VALVE REPLACEMENT N/A 12/30/2013   Procedure: REPAIR OF TRICUSPID VALVE LEAFLET;  Surgeon: Delight Ovens, MD;  Location: [facility] OR;  Service: Open Heart Surgery;  Laterality: N/A;  . TRICUSPID VALVE REPLACEMENT N/A 02/12/2014   Procedure: TRICUSPID VALVE REPAIR, REDO MEDIAN STERNOTOMY, REPAIR OF VENTRICULAR SEPTAL DEFECT (RECURRENT LV OUTFLOW TRACT TO RIGHT ATRIAL FISTULA), REDO CLOSURE OF PATENT FORAMEN OVALE;  Surgeon: Purcell Nails, MD;  Location: [facility] OR;  Service: Open Heart Surgery;  Laterality: N/A;     Medications Prior to Admission: Prior to Admission medications   Medication Sig Start Date End Date Taking? Authorizing Provider  acetaminophen (TYLENOL) 650 MG CR tablet Take 650 mg by mouth every 8 (eight) hours as needed for pain.   Yes [provider]  aspirin EC 81 MG tablet Take 81 mg by mouth daily.   Yes [provider]  metoprolol succinate (TOPROL-XL) 25 MG 24 hr tablet TAKE ONE TABLET  BY MOUTH DAILY Patient taking differently: Take 25 mg by mouth daily.  11/03/18  Yes [name], MD  levofloxacin (LEVAQUIN) 750 MG tablet Take 1 tablet (750 mg total) by mouth daily. 08/12/18   Randall Hiss, MD     Allergies:    Allergies  Allergen Reactions  . Cefepime Other (See Comments)    Leukopenia not clear if due to vancomycin vs cefepime. WBC dropped down  . Vancomycin Other (See Comments)    ? Drug induced leukopenia vs being due to cefepime. WBC dropped down    Social History:   Social History   Socioeconomic  History  . Marital status: Married    Spouse name: Not on file  . Number of children: Not on file  . Years of education: Not on file  . Highest education level: Not on file  Occupational History  . Not on file  Tobacco Use  . Smoking status: Never Smoker  . Smokeless tobacco: Former Engineer, water and Sexual Activity  . Alcohol use: Yes    Alcohol/week: 0.0 standard drinks    Comment: socially  . Drug use: No  . Sexual activity: Yes    Birth control/protection: Condom  Other Topics Concern  . Not on file  Social History Narrative  . Not on file   Social Determinants of Health   Financial Resource Strain:   . Difficulty of Paying Living Expenses: Not on file  Food Insecurity:   . Worried About Programme researcher, broadcasting/film/video in the Last Year: Not on file  . Ran Out of Food in the Last Year: Not on file  Transportation Needs:   . Lack of Transportation (Medical): Not on file  . Lack of Transportation (Non-Medical): Not on file  Physical Activity:   . Days of Exercise per Week: Not on file  . Minutes of Exercise per Session: Not on file  Stress:   . Feeling of Stress : Not on file  Social Connections:   . Frequency of Communication with Friends and Family: Not on file  . Frequency of Social Gatherings with Friends and Family: Not on file  . Attends Religious Services: Not on file  . Active Member of Clubs or Organizations: Not on file  . Attends Banker Meetings: Not on file  . Marital Status: Not on file  Intimate Partner Violence:   . Fear of Current or Ex-Partner: Not on file  . Emotionally Abused: Not on file  . Physically Abused: Not on file  . Sexually Abused: Not on file     Family History:   The patient's family history includes Heart murmur in his brother.    ROS:  Please see the history of present illness.  All other ROS reviewed and negative.     Physical Exam/Data:   Vitals:   04/05/19 0725 04/05/19 0727 04/05/19 0730 04/05/19 0815  BP:  128/70  113/72 116/71  Pulse: 85  92 76  Resp: 15  15 15   Temp: 98.1 F (36.7 C)     TempSrc: Oral     SpO2: 100%  97% 96%  Weight:  111.1 kg    Height:  5\' 5"  (1.651 m)     No intake or output data in the 24 hours ending 04/05/19 0856 Last 3 Weights 04/05/2019 10/13/2018 09/16/2017  Weight (lbs) 245 lb 256 lb 254 lb  Weight (kg) 111.131 kg 116.121 kg 115.214 kg     Body mass index is 40.77 kg/m.  Wt Readings from Last 3 Encounters:  04/05/19 111.1 kg  10/13/18 116.1 kg  09/16/17 115.2 kg    Physical Exam: General: Well developed, well nourished obese WM, in no acute distress. Head: Normocephalic, atraumatic, sclera non-icteric, no xanthomas, nares are without discharge.  Neck: Negative for carotid bruits. JVD not elevated. Lungs: Clear bilaterally to auscultation without wheezes, rales, or rhonchi. Breathing is unlabored. Heart: RRR with S1 S2. 2/6 SEM, no rubs or gallops appreciated. Well healed sternotomy scar Abdomen: Soft, non-tender, non-distended with normoactive bowel sounds. No hepatomegaly. No rebound/guarding. No obvious abdominal masses. Msk:  Strength and tone appear normal for age. Extremities: No clubbing or cyanosis. No edema.  Distal pedal pulses are 2+ and equal bilaterally. Neuro: Alert and oriented X 3. No focal deficit. No facial asymmetry. Moves all extremities spontaneously. Psych:  Responds to questions appropriately with a normal affect.    EKG:  The ECG that was done today was personally reviewed and demonstrates V paced rhythm 87bpm with intermittent loss of capture (P wave activity without subsequent V waves)  Relevant CV Studies: 2D echo 10/2018 1. The left ventricle has mildly reduced systolic function, with an  ejection fraction of 45-50%. The cavity size was normal. Left ventricular  diastolic parameters were normal. There is abnormal septal motion  consistent with left bundle branch block and  abnormal septal motion consistent with RV pacemaker.  Left ventricular  diffuse hypokinesis.  2. VSD patch present. On Image 31, there does appear to be residual VSD  left to right flow.  3. No evidence of mitral valve stenosis.  4. Aortic valve regurgitation is mild by color flow Doppler.  Mild-moderate stenosis of the aortic valve.  5. The aorta is normal unless otherwise noted.  6. The ascending aorta and aortic arch are normal in size and structure.  7. The atrial septum is grossly normal.   Laboratory Data:  Hematology Recent Labs  Lab 04/05/19 0730  WBC 6.1  RBC 5.07  HGB 15.3  HCT 46.5  MCV 91.7  MCH 30.2  MCHC 32.9  RDW 12.8  PLT 187   BMET    Component Value Date/Time   NA 139 04/05/2019 0730   K 4.4 04/05/2019 0730   CL 107 04/05/2019 0730   CO2 21 (L) 04/05/2019 0730   GLUCOSE 124 (H) 04/05/2019 0730   BUN 18 04/05/2019 0730   CREATININE 1.26 (H) 04/05/2019 0730   CREATININE 1.32 06/10/2017 1407   CALCIUM 8.9 04/05/2019 0730   GFRNONAA >60 04/05/2019 0730   GFRNONAA 72 06/10/2017 1407   GFRAA >60 04/05/2019 0730   GFRAA 84 06/10/2017 1407    Radiology/Studies:  DG Chest Port 1 View  Result Date: 04/05/2019 CLINICAL DATA:  Dyspnea.  Prior aortic valve replacement. EXAM: PORTABLE CHEST 1 VIEW COMPARISON:  04/30/2014 FINDINGS: Sternotomy wires and left-sided pacemaker unchanged. Lungs are adequately inflated without focal airspace consolidation or effusion. Borderline stable cardiomegaly. Remainder of the exam is unchanged. IMPRESSION: No acute cardiopulmonary disease. Electronically Signed   By: Elberta Fortis M.D.   On: 04/05/2019 08:06    Assessment and Plan   1. Dizziness/dyspnea with intermittent pauses (s/p prior pacemaker) - Dr. Graciela Husbands suspects lead failure. See below for further EP input.  2. History of structural heart disease as outlined above with aortic root and valve replacement with fistula repair in 2015, repair of VSD, TV repair, redo PFO closure in 2016 - recent echo 10/2018 was stable.  Dr. Graciela Husbands feels that since patient has not  been taking ASA at home, hold while in hospital.  3. History of endocarditis in 2015, with possible repeat vegetation in 2016 - treated with abx at that time. Was due to be on Levaquin long term per ID, but has not been taking for the last 1 month except 1 dose two days ago. Check blood cultures and update echocardiogram. Dr. Caryl Comes recommends resumption of home Levaquin and suggests that conversations with ID will likely be necessary later this admission.  4. Morbid obesity - pt has been making effort to go to gym recently. Lifestyle modification will be helpful when more stable.  5. Elevated creatinine - Cr 1.26, with CrCl of 133.62ml/min - and stable compared to prior. Does not reflect renal insufficiency.  6. Hyperglycemia - check A1C with AM labs.  7. H/o cardiomyopathy - hold metoprolol given bradycardia. Would not consider ACEI/ARB right now until bradycardia is treated. Appears euvolemic. CXR clear.  DVT ppx: plan SCDs in case acute intervention is needed. Once determination is made for timing of pacemaker lead procedure, this will need to be readdressed.  For questions or updates, please contact [facility] Please consult [email or web address] for contact info under    Signed, Charlie Pitter, PA-C 04/05/2019, 8:56 AM  Complete heart block (following aortic valve replacement for endocarditis)   Pacemaker--Medtronic-  Lead failure  Aortic root/aortic valve replacement 2015 with repair of fistula.  Complicated by VSD secondary to dehiscence of tricuspid valve repair and redo PFO closure  Endocarditis with concern about recurrent lesion on his aortic valve recommended long-term antibiotics  Cardiomyopathy-mild   Patient with a complex cardiac history now with ventricular lead failure in the setting of complete heart block resulting in asystole.  Will need lead revision.  Currently have addressed the issue by programming max output.  Was not  able to programmed unipolar because of tactile stimulation.  We will review with Dr. [name] as to whether to extract and replace the RV lead or simply to insert a new RV lead.  Will defer that to his expertise.  Given the history of recurrent mobile density on the aortic valve prompting recommendation of long-term antibiotics which she has not been taking for the last month except for 1 dose 2 days ago we will check blood cultures.  I wonder how it is and we make a distinction between implant and infectious lesion in this 34 year old male so as to not necessitate lifelong antibiotics with the potential sequelae.  I also wonder in the setting of this gentleman who had aortic root replacement whether there are any concerns about using quinolones.  Reviewed this with him and his wife by telephone.

## 2019-04-05 NOTE — ED Triage Notes (Addendum)
Pt to ED via EMS, pt has hx Medtronic pacemaker x 5 years d/t endocarditis. . Last evaluated 1 year ago. Artifical aortic valve. This morning pt was driving to work- felt light headed/dizzy, Pacemaker issues, EMS EKG strip shows not capturing , Denies chest pain. bp:118/60- cbg 161. #18LAC 350NS given.

## 2019-04-05 NOTE — ED Notes (Signed)
medtronic representative at bedside

## 2019-04-05 NOTE — Progress Notes (Signed)
Carries a diagnosis of possible cefipime and vancomycin allergy HIs ID MD is [employer]   Prob important to clarify with him what the best antibiotic regime would be  BTW  BC x 2 are pending

## 2019-04-06 ENCOUNTER — Encounter (HOSPITAL_COMMUNITY)
Admission: EM | Disposition: A | Discharge status: Home / Self Care | Payer: Self-pay | Source: Home / Self Care | Attending: Internal Medicine

## 2019-04-06 ENCOUNTER — Inpatient Hospital Stay (HOSPITAL_COMMUNITY): Payer: 59

## 2019-04-06 ENCOUNTER — Inpatient Hospital Stay (HOSPITAL_COMMUNITY): Payer: 59 | Admitting: Anesthesiology

## 2019-04-06 ENCOUNTER — Ambulatory Visit (INDEPENDENT_AMBULATORY_CARE_PROVIDER_SITE_OTHER): Payer: 59 | Admitting: *Deleted

## 2019-04-06 DIAGNOSIS — Z950 Presence of cardiac pacemaker: Secondary | ICD-10-CM

## 2019-04-06 DIAGNOSIS — T82110A Breakdown (mechanical) of cardiac electrode, initial encounter: Secondary | ICD-10-CM

## 2019-04-06 DIAGNOSIS — R0602 Shortness of breath: Secondary | ICD-10-CM

## 2019-04-06 DIAGNOSIS — I442 Atrioventricular block, complete: Secondary | ICD-10-CM

## 2019-04-06 HISTORY — PX: VENOGRAM: SHX5497

## 2019-04-06 HISTORY — PX: PACEMAKER INSERTION: SHX728

## 2019-04-06 HISTORY — PX: PACEMAKER LEAD REMOVAL: SHX5064

## 2019-04-06 LAB — CBC
HCT: 44.8 % (ref 39.0–52.0)
Hemoglobin: 15.1 g/dL (ref 13.0–17.0)
MCH: 30 pg (ref 26.0–34.0)
MCHC: 33.7 g/dL (ref 30.0–36.0)
MCV: 88.9 fL (ref 80.0–100.0)
Platelets: 199 10*3/uL (ref 150–400)
RBC: 5.04 MIL/uL (ref 4.22–5.81)
RDW: 12.9 % (ref 11.5–15.5)
WBC: 6.9 10*3/uL (ref 4.0–10.5)
nRBC: 0 % (ref 0.0–0.2)

## 2019-04-06 LAB — CUP PACEART REMOTE DEVICE CHECK
Battery Impedance: 554 Ohm
Battery Remaining Longevity: 75 mo
Battery Voltage: 2.78 V
Brady Statistic AP VP Percent: 6 %
Brady Statistic AP VS Percent: 0 %
Brady Statistic AS VP Percent: 94 %
Brady Statistic AS VS Percent: 0 %
Date Time Interrogation Session: 20210221072657
Implantable Lead Implant Date: 20151123
Implantable Lead Implant Date: 20151123
Implantable Lead Location: 753859
Implantable Lead Location: 753860
Implantable Lead Model: 5076
Implantable Lead Model: 5076
Implantable Pulse Generator Implant Date: 20151123
Lead Channel Impedance Value: 396 Ohm
Lead Channel Impedance Value: 416 Ohm
Lead Channel Pacing Threshold Amplitude: 0.5 V
Lead Channel Pacing Threshold Amplitude: 0.75 V
Lead Channel Pacing Threshold Pulse Width: 0.4 ms
Lead Channel Pacing Threshold Pulse Width: 0.4 ms
Lead Channel Setting Pacing Amplitude: 1.5 V
Lead Channel Setting Pacing Amplitude: 2.5 V
Lead Channel Setting Pacing Pulse Width: 0.4 ms
Lead Channel Setting Sensing Sensitivity: 4 mV

## 2019-04-06 LAB — HIV ANTIBODY (ROUTINE TESTING W REFLEX): HIV Screen 4th Generation wRfx: NONREACTIVE

## 2019-04-06 LAB — ECHOCARDIOGRAM COMPLETE
Height: 65 in
Weight: 4040 oz

## 2019-04-06 LAB — BASIC METABOLIC PANEL
Anion gap: 12 (ref 5–15)
BUN: 18 mg/dL (ref 6–20)
CO2: 23 mmol/L (ref 22–32)
Calcium: 9.3 mg/dL (ref 8.9–10.3)
Chloride: 106 mmol/L (ref 98–111)
Creatinine, Ser: 1.21 mg/dL (ref 0.61–1.24)
GFR calc Af Amer: >60 mL/min (ref >60)
GFR calc non Af Amer: >60 mL/min (ref >60)
Glucose, Bld: 106 mg/dL — ABNORMAL HIGH (ref 70–99)
Potassium: 3.8 mmol/L (ref 3.5–5.1)
Sodium: 141 mmol/L (ref 135–145)

## 2019-04-06 LAB — HEMOGLOBIN A1C
Hgb A1c MFr Bld: 5.5 % (ref 4.8–5.6)
Mean Plasma Glucose: 111.15 mg/dL

## 2019-04-06 SURGERY — LEAD REVISION/REPAIR
Anesthesia: LOCAL

## 2019-04-06 SURGERY — REMOVAL, ELECTRODE LEAD, CARDIAC PACEMAKER, WITHOUT REPLACEMENT
Anesthesia: General | Site: Chest

## 2019-04-06 MED ORDER — ONDANSETRON HCL 4 MG/2ML IJ SOLN
INTRAMUSCULAR | Status: DC | PRN
  Administered 2019-04-06: 4 mg via INTRAVENOUS

## 2019-04-06 MED ORDER — OXYCODONE HCL 5 MG/5ML PO SOLN: 5.0000 mg | Freq: Once | ORAL | Status: DC | PRN

## 2019-04-06 MED ORDER — SUGAMMADEX SODIUM 200 MG/2ML IV SOLN
INTRAVENOUS | Status: DC | PRN
  Administered 2019-04-06: 250 mg via INTRAVENOUS

## 2019-04-06 MED ORDER — PROMETHAZINE HCL 25 MG/ML IJ SOLN
INTRAMUSCULAR | Status: AC
  Filled 2019-04-06: qty 1

## 2019-04-06 MED ORDER — SODIUM CHLORIDE (PF) 0.9 % IJ SOLN
INTRAMUSCULAR | Status: AC
  Filled 2019-04-06: qty 10

## 2019-04-06 MED ORDER — ROCURONIUM BROMIDE 10 MG/ML (PF) SYRINGE
PREFILLED_SYRINGE | INTRAVENOUS | Status: DC | PRN
  Administered 2019-04-06: 40 mg via INTRAVENOUS
  Administered 2019-04-06: 60 mg via INTRAVENOUS

## 2019-04-06 MED ORDER — ONDANSETRON HCL 4 MG/2ML IJ SOLN: 4.0000 mg | Freq: Four times a day (QID) | INTRAMUSCULAR | Status: DC | PRN

## 2019-04-06 MED ORDER — MIDAZOLAM HCL 5 MG/5ML IJ SOLN
INTRAMUSCULAR | Status: DC | PRN
  Administered 2019-04-06: 2 mg via INTRAVENOUS

## 2019-04-06 MED ORDER — OXYCODONE HCL 5 MG PO TABS: 5.0000 mg | ORAL_TABLET | Freq: Once | ORAL | Status: DC | PRN

## 2019-04-06 MED ORDER — FENTANYL CITRATE (PF) 100 MCG/2ML IJ SOLN
25.0000 ug | INTRAMUSCULAR | Status: DC | PRN
  Administered 2019-04-06 (×2): 25 ug via INTRAVENOUS

## 2019-04-06 MED ORDER — PROPOFOL 10 MG/ML IV BOLUS
INTRAVENOUS | Status: DC | PRN
  Administered 2019-04-06: 40 mg via INTRAVENOUS

## 2019-04-06 MED ORDER — ONDANSETRON HCL 4 MG/2ML IJ SOLN
INTRAMUSCULAR | Status: AC
  Filled 2019-04-06: qty 2

## 2019-04-06 MED ORDER — LACTATED RINGERS IV SOLN: INTRAVENOUS | Status: DC | PRN

## 2019-04-06 MED ORDER — SODIUM CHLORIDE 0.9 % IV SOLN
INTRAVENOUS | Status: AC
  Filled 2019-04-06 (×2): qty 2

## 2019-04-06 MED ORDER — SODIUM CHLORIDE 0.9 % IV SOLN
INTRAVENOUS | Status: AC
  Filled 2019-04-06 (×2): qty 1.2

## 2019-04-06 MED ORDER — CEFAZOLIN SODIUM-DEXTROSE 1-4 GM/50ML-% IV SOLN
1.0000 g | Freq: Four times a day (QID) | INTRAVENOUS | Status: AC
  Administered 2019-04-06 – 2019-04-07 (×3): 1 g via INTRAVENOUS
  Filled 2019-04-06 (×3): qty 50

## 2019-04-06 MED ORDER — LIDOCAINE 2% (20 MG/ML) 5 ML SYRINGE
INTRAMUSCULAR | Status: DC | PRN
  Administered 2019-04-06: 60 mg via INTRAVENOUS

## 2019-04-06 MED ORDER — FENTANYL CITRATE (PF) 250 MCG/5ML IJ SOLN
INTRAMUSCULAR | Status: AC
  Filled 2019-04-06: qty 5

## 2019-04-06 MED ORDER — IODIXANOL 320 MG/ML IV SOLN
INTRAVENOUS | Status: DC | PRN
  Administered 2019-04-06: 50 mL

## 2019-04-06 MED ORDER — SODIUM CHLORIDE 0.9 % IV SOLN
INTRAVENOUS | Status: DC | PRN
  Administered 2019-04-06 (×2): 500 mL

## 2019-04-06 MED ORDER — ACETAMINOPHEN 325 MG PO TABS: 325.0000 mg | ORAL_TABLET | ORAL | Status: DC | PRN

## 2019-04-06 MED ORDER — PROMETHAZINE HCL 25 MG/ML IJ SOLN
6.2500 mg | Freq: Once | INTRAMUSCULAR | Status: AC
  Administered 2019-04-06: 6.25 mg via INTRAVENOUS

## 2019-04-06 MED ORDER — SODIUM CHLORIDE 0.9 % IV SOLN: INTRAVENOUS | Status: DC | PRN

## 2019-04-06 MED ORDER — ETOMIDATE 2 MG/ML IV SOLN
INTRAVENOUS | Status: DC | PRN
  Administered 2019-04-06: 20 mg via INTRAVENOUS

## 2019-04-06 MED ORDER — ONDANSETRON HCL 4 MG/2ML IJ SOLN
4.0000 mg | Freq: Four times a day (QID) | INTRAMUSCULAR | Status: AC | PRN
  Administered 2019-04-06: 4 mg via INTRAVENOUS

## 2019-04-06 MED ORDER — PROPOFOL 10 MG/ML IV BOLUS
INTRAVENOUS | Status: AC
  Filled 2019-04-06: qty 20

## 2019-04-06 MED ORDER — DEXAMETHASONE SODIUM PHOSPHATE 10 MG/ML IJ SOLN
INTRAMUSCULAR | Status: DC | PRN
  Administered 2019-04-06: 4 mg via INTRAVENOUS

## 2019-04-06 MED ORDER — SUGAMMADEX SODIUM 500 MG/5ML IV SOLN
INTRAVENOUS | Status: AC
  Filled 2019-04-06: qty 10

## 2019-04-06 MED ORDER — PHENYLEPHRINE HCL-NACL 10-0.9 MG/250ML-% IV SOLN
INTRAVENOUS | Status: DC | PRN
  Administered 2019-04-06: 25 ug/min via INTRAVENOUS

## 2019-04-06 MED ORDER — EPINEPHRINE 1 MG/10ML IJ SOSY
PREFILLED_SYRINGE | INTRAMUSCULAR | Status: AC
  Filled 2019-04-06: qty 10

## 2019-04-06 MED ORDER — MIDAZOLAM HCL 2 MG/2ML IJ SOLN
INTRAMUSCULAR | Status: AC
  Filled 2019-04-06: qty 2

## 2019-04-06 MED ORDER — CEFAZOLIN SODIUM-DEXTROSE 2-3 GM-%(50ML) IV SOLR
INTRAVENOUS | Status: DC | PRN
  Administered 2019-04-06: 2 g via INTRAVENOUS

## 2019-04-06 MED ORDER — FENTANYL CITRATE (PF) 100 MCG/2ML IJ SOLN
INTRAMUSCULAR | Status: AC
  Filled 2019-04-06: qty 2

## 2019-04-06 MED ORDER — FENTANYL CITRATE (PF) 250 MCG/5ML IJ SOLN
INTRAMUSCULAR | Status: DC | PRN
  Administered 2019-04-06: 50 ug via INTRAVENOUS
  Administered 2019-04-06 (×2): 100 ug via INTRAVENOUS

## 2019-04-06 SURGICAL SUPPLY — 62 items
BAG BANDED W/RUBBER/TAPE 36X54 (MISCELLANEOUS) ×4 IMPLANT
BAG EQP BAND 135X91 W/RBR TAPE (MISCELLANEOUS) ×2
BLADE CORE FAN STRYKER (BLADE) ×2 IMPLANT
BLADE OSCILLATING /SAGITTAL (BLADE) IMPLANT
BLADE STERNUM SYSTEM 6 (BLADE) ×2 IMPLANT
BNDG ADH 5X4 AIR PERM ELC (GAUZE/BANDAGES/DRESSINGS)
BNDG COHESIVE 4X5 WHT NS (GAUZE/BANDAGES/DRESSINGS) IMPLANT
CABLE SURGICAL S-101-97-12 (CABLE) ×2 IMPLANT
CANISTER SUCT 3000ML PPV (MISCELLANEOUS) ×4 IMPLANT
CATH S G BIP PACING (CATHETERS) ×2 IMPLANT
COVER BACK TABLE 60X90IN (DRAPES) ×4 IMPLANT
COVER WAND RF STERILE (DRAPES) ×2 IMPLANT
DEVICE TORQUE KENDALL .025-038 (MISCELLANEOUS) ×2 IMPLANT
DRAPE CARDIOVASC SPLIT 88X140 (DRAPES) ×2 IMPLANT
DRAPE CARDIOVASCULAR INCISE (DRAPES) ×4
DRAPE INCISE IOBAN 66X45 STRL (DRAPES) ×4 IMPLANT
DRAPE SRG 135X102X78XABS (DRAPES) ×2 IMPLANT
DRSG OPSITE 6X11 MED (GAUZE/BANDAGES/DRESSINGS) IMPLANT
ELECT CAUTERY BLADE 6.4 (BLADE) ×2 IMPLANT
ELECT REM PT RETURN 9FT ADLT (ELECTROSURGICAL) ×8
ELECTRODE REM PT RTRN 9FT ADLT (ELECTROSURGICAL) ×4 IMPLANT
GAUZE 4X4 16PLY RFD (DISPOSABLE) ×4 IMPLANT
GAUZE SPONGE 4X4 12PLY STRL (GAUZE/BANDAGES/DRESSINGS) IMPLANT
GLOVE BIOGEL PI IND STRL 6.5 (GLOVE) IMPLANT
GLOVE BIOGEL PI IND STRL 7.5 (GLOVE) ×2 IMPLANT
GLOVE BIOGEL PI INDICATOR 6.5 (GLOVE) ×4
GLOVE BIOGEL PI INDICATOR 7.5 (GLOVE) ×2
GLOVE ECLIPSE 8.0 STRL XLNG CF (GLOVE) ×4 IMPLANT
GOWN STRL REUS W/ TWL LRG LVL3 (GOWN DISPOSABLE) IMPLANT
GOWN STRL REUS W/ TWL XL LVL3 (GOWN DISPOSABLE) ×2 IMPLANT
GOWN STRL REUS W/TWL LRG LVL3 (GOWN DISPOSABLE)
GOWN STRL REUS W/TWL XL LVL3 (GOWN DISPOSABLE) ×4
GUIDEWIRE ANGLED .035X150CM (WIRE) ×2 IMPLANT
IPG PACE AZUR XT DR MRI W1DR01 (Pacemaker) IMPLANT
KIT LEAD ACCESSORY 6056 PINCH (KITS) IMPLANT
KIT TURNOVER KIT B (KITS) ×4 IMPLANT
KIT WRENCH (KITS) IMPLANT
LEAD CAPSURE NOVUS 5076-58CM (Lead) ×4 IMPLANT
NDL PERC 18GX7CM (NEEDLE) IMPLANT
NEEDLE PERC 18GX7CM (NEEDLE) ×4 IMPLANT
PACE AZURE XT DR MRI W1DR01 (Pacemaker) ×4 IMPLANT
PACEMAKER STYLET GRAY (MISCELLANEOUS) IMPLANT
PAD ARMBOARD 7.5X6 YLW CONV (MISCELLANEOUS) ×4 IMPLANT
PAD ELECT DEFIB RADIOL ZOLL (MISCELLANEOUS) ×4 IMPLANT
PENCIL BUTTON HOLSTER BLD 10FT (ELECTRODE) ×2 IMPLANT
SHEATH 11 SUB-C ROTATE DILATOR (SHEATH) ×2 IMPLANT
SHEATH 7FR PRELUDE SNAP 13 (SHEATH) ×2 IMPLANT
SHEATH CLASSIC 9F 25CM (SHEATH) ×4 IMPLANT
SHEATH PINNACLE 6F 10CM (SHEATH) ×2 IMPLANT
SLEEVE SURGICAL STRL (SLEEVE) ×2 IMPLANT
SLING ARM FOAM STRAP XLG (SOFTGOODS) ×2 IMPLANT
STYLET LIBERATOR LOCKING (MISCELLANEOUS) ×2 IMPLANT
SUT PROLENE 2 0 SH DA (SUTURE) IMPLANT
SYR 20ML LL LF (SYRINGE) ×4 IMPLANT
TOWEL GREEN STERILE (TOWEL DISPOSABLE) ×8 IMPLANT
TOWEL GREEN STERILE FF (TOWEL DISPOSABLE) ×8 IMPLANT
TRAY FOLEY SLVR 16FR TEMP STAT (SET/KITS/TRAYS/PACK) ×4 IMPLANT
TUBE CONNECTING 12'X1/4 (SUCTIONS)
TUBE CONNECTING 12X1/4 (SUCTIONS) IMPLANT
TUBING EXTENTION W/L.L. (IV SETS) ×2 IMPLANT
WIRE MICRO SET SILHO 5FR 7 (SHEATH) ×2 IMPLANT
YANKAUER SUCT BULB TIP NO VENT (SUCTIONS) IMPLANT

## 2019-04-06 NOTE — Anesthesia Procedure Notes (Addendum)
Procedure Name: Intubation Date/Time: 04/06/2019 3:20 PM Performed by: Adria Dill, CRNA Pre-anesthesia Checklist: Patient identified, Emergency Drugs available, Suction available and Patient being monitored Patient Re-evaluated:Patient Re-evaluated prior to induction Oxygen Delivery Method: Circle system utilized Preoxygenation: Pre-oxygenation with 100% oxygen Induction Type: IV induction Ventilation: Mask ventilation without difficulty Laryngoscope Size: Miller and 2 Grade View: Grade I Tube type: Oral Tube size: 7.5 mm Number of attempts: 1 Airway Equipment and Method: Stylet and Oral airway Placement Confirmation: ETT inserted through vocal cords under direct vision,  positive ETCO2 and breath sounds checked- equal and bilateral Secured at: 22 cm Tube secured with: Tape Dental Injury: Teeth and Oropharynx as per pre-operative assessment

## 2019-04-06 NOTE — Progress Notes (Addendum)
Electrophysiology Rounding Note  Patient Name: [name] Date of Encounter: 04/06/2019  Primary Cardiologist: Kirk Ruths, MD Electrophysiologist: Cristopher Peru, MD   Subjective   The patient is doing well today.  At this time, the patient denies chest pain, shortness of breath, or any new concerns.  Inpatient Medications    Scheduled Meds: . gentamicin irrigation  80 mg Irrigation To Cath  . levofloxacin  750 mg Oral Daily  . sodium chloride flush  3 mL Intravenous Q12H   Continuous Infusions: . sodium chloride    . sodium chloride    . sodium chloride 50 mL/hr at 04/06/19 0828  . vancomycin     PRN Meds: sodium chloride, acetaminophen, ondansetron (ZOFRAN) IV, sodium chloride flush   Vital Signs    Vitals:   04/05/19 1629 04/05/19 2100 04/06/19 0544 04/06/19 0800  BP: 113/66 100/64 (!) 107/55 112/88  Pulse: 100 79 65   Resp:  20 18 18   Temp:  97.6 F (36.4 C) 97.7 F (36.5 C) 97.9 F (36.6 C)  TempSrc:  Oral Oral Oral  SpO2: 97% 97% 98%   Weight:   114.5 kg   Height:        Intake/Output Summary (Last 24 hours) at 04/06/2019 0829 Last data filed at 04/05/2019 2130 Gross per 24 hour  Intake 960 ml  Output 580 ml  Net 380 ml   Filed Weights   04/05/19 0727 04/05/19 1029 04/06/19 0544  Weight: 111.1 kg 115.9 kg 114.5 kg    Physical Exam    GEN- The patient is well appearing, alert and oriented x 3 today.   Head- normocephalic, atraumatic Eyes-  Sclera clear, conjunctiva pink Ears- hearing intact Oropharynx- clear Neck- supple Lungs- Clear to ausculation bilaterally, normal work of breathing Heart- Regular rate and rhythm, no murmurs, rubs or gallops GI- soft, NT, ND, + BS Extremities- no clubbing, cyanosis, or edema Skin- no rash or lesion Psych- euthymic mood, full affect Neuro- strength and sensation are intact  Labs    CBC Recent Labs    04/05/19 0730 04/06/19 0252  WBC 6.1 6.9  HGB 15.3 15.1  HCT 46.5 44.8  MCV 91.7 88.9    PLT 187 941   Basic Metabolic Panel Recent Labs    04/05/19 0730 04/06/19 0252  NA 139 141  K 4.4 3.8  CL 107 106  CO2 21* 23  GLUCOSE 124* 106*  BUN 18 18  CREATININE 1.26* 1.21  CALCIUM 8.9 9.3   Liver Function Tests No results for input(s): AST, ALT, ALKPHOS, BILITOT, PROT, ALBUMIN in the last 72 hours. No results for input(s): LIPASE, AMYLASE in the last 72 hours. Cardiac Enzymes No results for input(s): CKTOTAL, CKMB, CKMBINDEX, TROPONINI in the last 72 hours.   Telemetry    V pacing in 80-90s (personally reviewed)  Radiology    DG Chest Port 1 View  Result Date: 04/05/2019 CLINICAL DATA:  Dyspnea.  Prior aortic valve replacement. EXAM: PORTABLE CHEST 1 VIEW COMPARISON:  04/30/2014 FINDINGS: Sternotomy wires and left-sided pacemaker unchanged. Lungs are adequately inflated without focal airspace consolidation or effusion. Borderline stable cardiomegaly. Remainder of the exam is unchanged. IMPRESSION: No acute cardiopulmonary disease. Electronically Signed   By: Marin Olp M.D.   On: 04/05/2019 08:06    Patient Profile     [name] is a 34 y.o. male with complicated cardiac history as below. he was admitted for dizziness with dyspnea and found to have RV lead failure.   Assessment & Plan  1. RV lead failure Resulting in CHB and asystole; programmed around currently by increasing output.  Plan for lead revision today  2. H/o structural heart disease with Aortic root and valve replacement with fistula repar in 2015 , repair of VSD, TV repair, and redo PFO closure in 2016  Echo 10/2018 stable  3. H/o endocarditis in 2015, with possible recurrent vegetation in 2015 Treated with IV ABX at that time. Was due to be on levaquin long term, but had not been taking for the last month prior to admission.  Blood cultures pending Repeat echo pending.  Will ask ID to see.   4. Morbid obesity Body mass index is 42.02 kg/m.  Has been going back to the  gym. Encouraged lifestyle modification  For questions or updates, please contact [facility] Please consult [email or web address] for contact info under Cardiology/STEMI.  Signed, Graciella Freer, PA-C  04/06/2019, 8:29 AM   EP Attending  Patient seen and examined. He has CHB and developed a broken PM lead. He has had his outputs increased to maximum output by Dr. Crissie Sickles. He has occaisional loss of capture still. He will proceed with PPM removal and insertion of a new PPM. I have reviewed the indications/risks/benefitsgoals/expectations of the procedure and he wishes to proceed.  Leonia Reeves.D.

## 2019-04-06 NOTE — CV Procedure (Signed)
EP procedure note  Procedure performed: Removal of a pacemaker RV lead which had malfunctioned, insertion of a temporary pacemaker lead and insertion of a new permanent RV pacing lead with removal of the previously implanted pacemaker generator which had less than a year of battery longevity and insertion of a new dual-chamber pacemaker.  Preoperative diagnosis: Complete heart block status post pacemaker insertion with failure and noncapture of the right ventricular pacing lead  Postoperative diagnosis: Same as preoperative diagnosis  Description of the procedure: After informed consent was obtained, the patient was taken to the OR in the fasting state. The anesthesia service was utilized to provide general endotracheal anesthesia. An art line was placed for invasive arterial hemodynamic monitoring. A 6 French sheath was inserted percutaneously into the right femoral vein. Attention was then turned to the left subclavian vein. Venography of the left upper extremity was carried out which unfortunately demonstrated an occluded left subclavian vein. Initial attempts to puncture the vein and possibly get past the occlusion were unsuccessful. At this point a temporary transvenous pacemaker was inserted percutaneously into the right femoral vein under fluoroscopic guidance and advanced to the right ventricle. The pacing threshold was a volt at 1 ms. The temporary wire was left in place.  Attention was then turned to the right ventricular failed permanent pacemaker lead. A 5 cm incision was carried out over the left infraclavicular region. Electrocautery was utilized to dissect down to the pacemaker generator. The generator was removed with gentle traction. Electrocautery was utilized to free up the dense fibrous scar tissue around the leads. There is no evidence of infection in the pocket. The ventricular lead was removed from the header. The temporary pacemaker began to pace the heart without difficulty. A  stylette was inserted into the right ventricular lead which had failed and the helix retracted. Gentle traction was placed on the lead and it was removed from the right ventricle without difficulty. At this point we attempted to advance the pacing lead back into the central circulation after placing a wire in the insulation. Initial attempts at this were unsuccessful secondary to the dense fibrous scar tissue at the junction of the innominate and subclavian vein. At this point, the lead was cut and the liberator locking stylette was inserted into the body of the lead. The lead was locked in place. A [employer] 11 French sub-C mechanical extraction sheath was advanced over the lead and with a moderate amount of difficulty past the fibrous scar tissue at the junction of the innominate and subclavian veins. The access to the central circulation was accomplished. At this point a Glidewire was advanced into the right atrium and the pacing lead was removed in total without hemodynamic sequelae. Next a long 86 French peel-away sheath was advanced over the guidewire. The Medtronic model 5076 58 cm active-fixation pacing lead, serial number [id number] was then advanced into the long sheath and used to map the right ventricle. At the final site, the R waves measured 10 mV paced and the pacing impedance with a lead actively fixed was 1200 ohms. The threshold 0.5 V at 0.4 ms. With the satisfactory parameters the leads were secured to the fascia with silk suture. Hemostasis was achieved with silk suture. The sewing sleeve was secured to the lead with silk suture. The pocket was irrigated with antibiotic irrigation. The new Medtronic dual-chamber pacemaker, serial number [id number] was connected to the old atrial and new right ventricular pacing lead and placed back in the subcutaneous pocket.  Additional antibiotic irrigation was utilized to irrigate the pocket. The temporary pacing wire was removed as was the sheath in the  right femoral vein. Benzoin and Steri-Strips were painted on the skin and a bandage was placed and a pressure dressing applied and the patient was returned to the recovery area in stable condition.  Complications: There were no immediate procedure complications  Conclusion: Successful pacemaker lead removal and insertion of the new active-fixation right ventricular pacing lead utilizing the old atrial pacing lead and successful removal of a previously implanted dual-chamber pacemaker which had less than a year of battery longevity and insertion of a new dual-chamber pacemaker generator in a patient with complete heart block who had failure of his right ventricular pacing lead resulting in syncope.  [name], MD

## 2019-04-06 NOTE — Transfer of Care (Signed)
Immediate Anesthesia Transfer of Care Note  Patient: [name]  Procedure(s) Performed: PACEMAKER LEAD REMOVAL (N/A Chest) PACEMAKER LEAD INSERTION (Left Chest) Venogram (Left Chest)  Patient Location: PACU  Anesthesia Type:General  Level of Consciousness: awake, drowsy and patient cooperative  Airway & Oxygen Therapy: Patient Spontanous Breathing and Patient connected to face mask oxygen  Post-op Assessment: Report given to RN and Post -op Vital signs reviewed and stable  Post vital signs: Reviewed and stable  Last Vitals:  Vitals Value Taken Time  BP 109/60 04/06/19 1808  Temp    Pulse 93 04/06/19 1808  Resp 13 04/06/19 1808  SpO2 97 % 04/06/19 1808  Vitals shown include unvalidated device data.  Last Pain:  Vitals:   04/06/19 1405  TempSrc: Oral  PainSc:       Patients Stated Pain Goal: 0 (04/05/19 2100)  Complications: No apparent anesthesia complications

## 2019-04-06 NOTE — Anesthesia Preprocedure Evaluation (Signed)
Anesthesia Evaluation  Patient identified by MRN, date of birth, ID band Patient awake    Reviewed: Allergy & Precautions, H&P , NPO status , Patient's Chart, lab work & pertinent test results  Airway Mallampati: II   Neck ROM: full    Dental   Pulmonary shortness of breath,    breath sounds clear to auscultation       Cardiovascular + dysrhythmias + pacemaker  Rhythm:regular Rate:Bradycardia  3rd degree heart block.  Pt currently has pacemaker malfunction and HR in 20s.  H/o AVR and root replacement, VSD repair, tricuspid valve repair.   Neuro/Psych  Headaches,    GI/Hepatic   Endo/Other  Morbid obesity  Renal/GU      Musculoskeletal   Abdominal   Peds  Hematology   Anesthesia Other Findings   Reproductive/Obstetrics                             Anesthesia Physical Anesthesia Plan  ASA: III and emergent  Anesthesia Plan: General   Post-op Pain Management:    Induction: Intravenous  PONV Risk Score and Plan: 2 and Ondansetron, Dexamethasone and Treatment may vary due to age or medical condition  Airway Management Planned: Oral ETT  Additional Equipment:   Intra-op Plan:   Post-operative Plan: Extubation in OR  Informed Consent: I have reviewed the patients History and Physical, chart, labs and discussed the procedure including the risks, benefits and alternatives for the proposed anesthesia with the patient or authorized representative who has indicated his/her understanding and acceptance.       Plan Discussed with: CRNA, Anesthesiologist and Surgeon  Anesthesia Plan Comments:         Anesthesia Quick Evaluation

## 2019-04-06 NOTE — Anesthesia Procedure Notes (Signed)
Arterial Line Insertion Start/End2/22/2021 3:28 PM Performed by: Achille Rich, MD, anesthesiologist  Patient location: OR. Preanesthetic checklist: patient identified, IV checked, site marked, risks and benefits discussed, surgical consent, monitors and equipment checked, pre-op evaluation, timeout performed and anesthesia consent Patient sedated Left, radial was placed Catheter size: 20 G Hand hygiene performed  and maximum sterile barriers used   Attempts: 1 Procedure performed without using ultrasound guided technique. Following insertion, dressing applied and Biopatch. Post procedure assessment: normal  Patient tolerated the procedure well with no immediate complications.

## 2019-04-06 NOTE — Progress Notes (Signed)
  Echocardiogram 2D Echocardiogram has been performed.  [name] 04/06/2019, 9:50 AM

## 2019-04-06 NOTE — Plan of Care (Signed)
PATIENT CARE PLAN  Problem: Education: Goal: Knowledge of General Education information will improve Description: Including pain rating scale, medication(s)/side effects and non-pharmacologic comfort measures Outcome: Progressing

## 2019-04-06 NOTE — Progress Notes (Signed)
PPM Remote  

## 2019-04-07 ENCOUNTER — Inpatient Hospital Stay (HOSPITAL_COMMUNITY): Payer: 59

## 2019-04-07 MED ORDER — ZOLPIDEM TARTRATE 5 MG PO TABS
5.0000 mg | ORAL_TABLET | Freq: Once | ORAL | Status: AC
  Administered 2019-04-07: 5 mg via ORAL
  Filled 2019-04-07: qty 1

## 2019-04-07 MED ORDER — METOPROLOL SUCCINATE ER 25 MG PO TB24
25.0000 mg | ORAL_TABLET | Freq: Every day | ORAL | Status: DC
  Administered 2019-04-07: 25 mg via ORAL
  Filled 2019-04-07: qty 1

## 2019-04-07 MED ORDER — AMOXICILLIN 500 MG PO CAPS: 500.0000 mg | ORAL_CAPSULE | Freq: Two times a day (BID) | ORAL | 6 refills | Status: DC

## 2019-04-07 NOTE — Discharge Summary (Addendum)
ELECTROPHYSIOLOGY PROCEDURE DISCHARGE SUMMARY    Patient ID: [name],  MRN: [id number], DOB/AGE: Apr 24, 1985 34 y.o.  Admit date: 04/05/2019 Discharge date: 04/07/2019  Primary Care Physician: Orpah Melter, MD  Primary Cardiologist: Kirk Ruths, MD Primary EP: Dr. 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 WBC dropped down   Vancomycin Other (See Comments)    ? Drug induced leukopenia vs being due to cefepime. WBC dropped down    Procedures This Admission:  1.  Lead extraction of a Medtronic PPM RV lead on 04/06/2019 by Dr. Lovena Le. There were no immediate post procedure complications  2. Insertion of temporary pacemaker lead and subsequent removal 3. Insertion of a new Medtronic RV lead 4. Generator change of a MDT dual chamber PPM (SN [id number]) 2.  CXR on 04/07/19 demonstrated stable lead placement and no pneumothorax status post lead revision  Brief HPI:  [name] is a 34 y.o. male presented to ED with dizziness and dyspnea and found to have RV lead failure. Past medical history includes above. Risk, benefits, and alternatives to implantable cardiac device system revision were reviewed with the patient who wished to proceed.   Hospital Course:  The patient was admitted on 04/05/2019 for implantable cardiac device lead revision due to RV lead failure as above with details outlined as above. This required back up in the OR due to lead age >1 year.    Discussed long term antibiotics with ID who recommended pt be discharged on amoxicillin 500 mg BID instead of levaquin, with close ID follow up.   The patient was examined and determined stable for discharge with close follow up as below.   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   Physical Exam: Vitals:   04/06/19 1908 04/06/19 1915 04/07/19 0624 04/07/19 0851  BP: 109/69  109/73 123/77  Pulse: 89  (!) 106 (!) 112  Resp: 17 14 16    Temp:   98.3 F (36.8 C)   TempSrc:   Oral   SpO2: 96%  97%   Weight:      Height:        GEN- The patient is well appearing, alert and oriented x 3 today.   HEENT: normocephalic, atraumatic; sclera clear, conjunctiva pink; hearing intact; oropharynx clear; neck supple, no JVP Lymph- no cervical lymphadenopathy Lungs- Clear to ausculation bilaterally, normal work of breathing.  No wheezes, rales, rhonchi Heart- Regular rate and rhythm, no murmurs, rubs or gallops, PMI not laterally displaced GI- soft, non-tender, non-distended, bowel sounds present, no hepatosplenomegaly Extremities- no clubbing, cyanosis, or edema; DP/PT/radial pulses 2+ bilaterally MS- no significant deformity or atrophy Skin- warm and dry, no rash or lesion Psych- euthymic mood, full affect Neuro- strength and sensation are intact    Labs:   Lab Results  Component  Value Date   WBC 6.9 04/06/2019   HGB 15.1 04/06/2019   HCT 44.8 04/06/2019   MCV 88.9 04/06/2019   PLT 199 04/06/2019    Recent Labs  Lab 04/06/19 0252  NA 141  K 3.8  CL 106  CO2 23  BUN 18  CREATININE 1.21  CALCIUM 9.3  GLUCOSE 106*    Discharge Medications:  Allergies as of 04/07/2019       Reactions   Cefepime Other (See Comments)   Leukopenia not clear if due to vancomycin vs cefepime. WBC dropped down   Vancomycin Other (See Comments)   ? Drug  induced leukopenia vs being due to cefepime. WBC dropped down        Medication List     STOP taking these medications    levofloxacin 750 MG tablet Commonly known as: LEVAQUIN       TAKE these medications    acetaminophen 650 MG CR tablet Commonly known as: TYLENOL Take 650 mg by mouth every 8 (eight) hours as needed for pain.   amoxicillin 500 MG capsule Commonly known as: AMOXIL Take 1 capsule (500 mg total) by mouth 2 (two) times daily.   aspirin EC 81 MG tablet Take 81 mg by mouth daily.   metoprolol succinate 25 MG 24 hr tablet Commonly known as: TOPROL-XL TAKE ONE TABLET BY MOUTH DAILY        Disposition: Pt is being discharged home today in good condition.   Follow-up Information     Graciella Freer, PA-C Follow up on 04/17/2019.   Specialty: Physician Assistant Why: at 1210 for post lead revision wound check Contact information: [address] [place] [address] [phone]         Marinus Maw, MD Follow up on 07/15/2019.   Specialty: Cardiology Why: at 215 for 3 month ICD check Contact information: [address] [place] [address] [phone]         Daiva Eves, Lisette Grinder, MD Follow up on 04/16/2019.   Specialty: Infectious Diseases Why: at 1000 for follow up.  Contact information: [address] [place] [address] [phone]            Duration of Discharge Encounter: Greater than 30 minutes including physician time.  Dustin Flock, PA-C  04/07/2019 9:52 AM  EP Attending  Patient seen and examined. Agree with the findings as noted above. The patient is s/p PM lead extraction with removal of the old PM (less than a year) and insertion of a new RV lead and generator. PM interrogation under my direction demonstrates normal DDD PM function. He will be discharged home on medical therapy as above. Usual followup.  Leonia Reeves.D.

## 2019-04-07 NOTE — Anesthesia Postprocedure Evaluation (Signed)
Anesthesia Post Note  Patient: Altonio Schwertner  Procedure(s) Performed: PACEMAKER LEAD REMOVAL (N/A Chest) PACEMAKER LEAD INSERTION (Left Chest) Venogram (Left Chest)     Patient location during evaluation: PACU Anesthesia Type: General Level of consciousness: awake and alert Pain management: pain level controlled Vital Signs Assessment: post-procedure vital signs reviewed and stable Respiratory status: spontaneous breathing, nonlabored ventilation, respiratory function stable and patient connected to nasal cannula oxygen Cardiovascular status: blood pressure returned to baseline and stable Postop Assessment: no apparent nausea or vomiting Anesthetic complications: no    Last Vitals:  Vitals:   04/07/19 0624 04/07/19 0851  BP: 109/73 123/77  Pulse: (!) 106 (!) 112  Resp: 16   Temp: 36.8 C   SpO2: 97%     Last Pain:  Vitals:   04/07/19 0624  TempSrc: Oral  PainSc:                  Jemiah Ellenburg S

## 2019-04-07 NOTE — Discharge Instructions (Signed)
After Your Lead Revision   Do not lift your arm above shoulder height for 1 week after your procedure. After 7 days, you may progress as below.     Tuesday April 14, 2019  Wednesday April 15, 2019 Thursday April 16, 2019 Friday April 17, 2019    Do not lift, push, pull, or carry anything over 10 pounds with the affected arm until 6 weeks (Tuesday May 19, 2019) after your procedure.    Do not drive until your wound check or until instructed by your healthcare provider that you are safe to do so.    Monitor your surgical site for redness, swelling, and drainage. Call the device clinic at [phone] if you experience these symptoms or fever/chills.   If your incision is sealed with Steri-strips or staples. You may shower 7 days after your procedure and wash your incision with soap and water as long as it is healed. If your incision is closed with Dermabond/Surgical glue. You may shower 1 day after your pacemaker implant and wash your incision with soap and water. Avoid lotions, ointments, or perfumes over your incision until it is well-healed.   You may use a hot tub or a pool AFTER your wound check appointment if the incision is completely closed.    Your cardiac device may be MRI compatible. We will discuss this at your office visit/Wound check   Remote monitoring is used to monitor your cardiac device from home. This monitoring is scheduled every 91 days by our office. It allows [place] to keep an eye on the functioning of your device to ensure it is working properly. You will routinely see your Electrophysiologist annually (more often if necessary).   Implantable Cardiac Device Lead Replacement, Care After This sheet gives you information about how to care for yourself after your procedure. Your health care provider may also give you more specific instructions. If you have problems or questions, contact your health care provider. What can I expect after the procedure? After your  procedure, it is common to have:  Mild discomfort at the incision site.  A small amount of drainage or bleeding at the incision site. This is usually no more than a spot. Follow these instructions at home: Incision care         Follow instructions from your health care provider about how to take care of your incision. Make sure you: ? Leave stitches (sutures), skin glue, or adhesive strips in place. These skin closures may need to stay in place for 2 weeks or longer. If adhesive strip edges start to loosen and curl up, you may trim the loose edges. Do not remove adhesive strips completely unless your health care provider tells you to do that.  Check your incision area every day for signs of infection. Check for: ? More redness, swelling, or pain. ? More fluid or blood. ? Warmth. ? Pus or a bad smell. Electric and IT sales professional cell phones should be kept 12 inches (30 cm) away from the cardiac device when they are on. When talking on a cell phone, use the ear on the opposite side of your cardiac device.  Do not place a cell phone in a pocket next to the cardiac device.  Household appliances do not interfere with modern-day cardiac device. Medicines  Take over-the-counter and prescription medicines only as told by your health care provider. General instructions  Do not raise the arm on the side of your procedure higher than your shoulder for  at least 7 days. Except for this restriction, continue to use your arm as normal to prevent problems.  Do not take baths, swim, or use a hot tub until your health care provider says it is okay to do so. You may shower as directed by your health care provider.  Do not lift anything that is heavier than 10 lb (4.5 kg) for 6 weeks or the limit that your health care provider tells you, until he or she says that it is safe.  Return to your normal activities after 2 weeks, or as told by your health care provider. Ask your health care  provider what activities are safe for you.  Keep all follow-up visits as told by your health care provider. This is important. Contact a health care provider if:  You have more redness, swelling, or pain around your incision.  You have more fluid or blood coming from your incision.  Your incision feels warm to the touch.  You have pus or a bad smell coming from your incision.  You have a fever.  The arm or hand on the side of the cardiac device becomes swollen.  The symptoms you had before your procedure are not getting better. Get help right away if:  You develop chest pain.  You feel like you will faint.  You feel light-headed.  You faint. Summary  Check your incision area every day for signs of infection, such as more fluid or blood. A small amount of drainage or bleeding at the incision site is normal.  Do not raise the arm on the side of your procedure higher than your shoulder for at least 5 days, or as long as directed by your health care provider.  Digital cell phones should be kept 12 inches (30 cm) away from the cardiac device when they are on. When talking on a cell phone, use the ear on the opposite side of your cardiac device.  If the symptoms that led to having your lead replaced are not getting better, contact your health care provider. This information is not intended to replace advice given to you by your health care provider. Make sure you discuss any questions you have with your health care provider.

## 2019-04-07 NOTE — Progress Notes (Signed)
Orthopedic Tech Progress Note Patient Details:  [name] 08-27-1985 [id number]  Ortho Devices Type of Ortho Device: Arm sling Ortho Device/Splint Location: LUE Ortho Device/Splint Interventions: Ordered, Application, Adjustment   Post Interventions Patient Tolerated: Well Instructions Provided: Care of device, Adjustment of device   [name] [name] 04/07/2019, 1:15 AM

## 2019-04-07 NOTE — Progress Notes (Signed)
[facility]  Date of Admission:  04/05/2019      [name] is a 34 y.o. male previously seen by Dr. 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 Recurrent mobile vegetation in March 2016 and treated with Vancomycin + Cefepime--> dapto + Levaquin after developed leukopenia on for culture negative PVE at that time with PPM in place already. He was instructed to keep Levaquin going for another 6 months then plan to either consider amoxicillin suppression vs trial off antimicrobials at follow up.   He was admitted 2/21 after he woke up from sleep one night with noted SOB with very minimal exertion. He was seen by Cardiology in ER and found to be in CHB with asystolic moments and suspected lead fracture. Prior to this event his last PPM interrogation was 10/2017.   In conversation with EP team [name] self-discontinued Levaquin 41m ago; there have been no constitutional symptoms concerning for re-infection, however the patient was not interviewed by our team directly. Intraoperative echo without any vegetations identified. No evidence of infection per [name] note with exploration of generator pocket; dense fibrous scar tissue around leads. Fractured RV lead was removed but not sent for culture from what I can see.   For now will elect to change to amoxicillin BID and have him follow up with Dr. Daiva [name] next week to discuss further decisions.   So far blood cultures were no growth from hospitalization drawn 2/21 preliminarily. Will need to follow up on these next week.     PLAN: 1. Start Amoxicillin 500 mg BID for suppression  2. Follow up with Dr. Daiva [name] 04-16-19 @ 10:30 am  3. 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 Cefepime Other (See Comments)    Leukopenia not clear if due to vancomycin vs cefepime. WBC dropped down  . Vancomycin Other (See Comments)    ? Drug induced leukopenia vs being due to cefepime. WBC dropped down    OBJECTIVE: Vitals:   04/06/19 1908 04/06/19 1915 04/07/19 0624 04/07/19 0851  BP: 109/69  109/73 123/77  Pulse: 89  (!) 106 (!) 112  Resp: 17 14 16    Temp:   98.3 F (36.8 C)   TempSrc:   Oral   SpO2: 96%  97%   Weight:      Height:       Body mass index is 42.02 kg/m.  Lab Results Lab Results  Component Value Date   WBC 6.9 04/06/2019   HGB 15.1 04/06/2019   HCT 44.8 04/06/2019   MCV 88.9 04/06/2019   PLT 199 04/06/2019    Lab Results  Component Value Date   CREATININE 1.21 04/06/2019   BUN 18 04/06/2019   NA 141 04/06/2019   K 3.8 04/06/2019   CL 106 04/06/2019   CO2 23 04/06/2019    Lab Results  Component Value Date   ALT 21 04/29/2014   AST 18 04/29/2014   ALKPHOS 62 04/29/2014   BILITOT 0.6 04/29/2014     Microbiology:  BCx 2/21 -  no growth x 2 preliminary    Albert Madeira, MSN, NP-C [facility] [email or web address] Pager: [phone] Office: [phone] [place]: [phone]

## 2019-04-08 ENCOUNTER — Encounter: Payer: Self-pay | Admitting: *Deleted

## 2019-04-08 NOTE — Progress Notes (Signed)
Ok, no worries and thanks [place]. This way we can also maybe see if beta lactams safe since it was confusing what was causing his leukopenia previously

## 2019-04-11 ENCOUNTER — Telehealth: Payer: Self-pay | Admitting: Cardiology

## 2019-04-11 NOTE — Telephone Encounter (Signed)
Pt called with dizziness and mild SOB happened yesterday and today.  His sp02 is 93-96% and HR 60-80.  Does not have BP cuff.   He did miss a dose of metoprolol and last 2 days has taken in morning instead of night.   Discussed with Dr. Ladona Ridgel pt will switch back to 25 mg metoprolol at hs in addition will arrange appt this week.

## 2019-04-13 ENCOUNTER — Telehealth: Payer: Self-pay | Admitting: Internal Medicine

## 2019-04-13 NOTE — Telephone Encounter (Signed)
Returned call to Pt.  Advised his pacemaker was functioning correctly.  Pt states BP is 139-77 pulse 76  Unsure what is causing dizziness.  Pt will see APP today.  Asked to have this note faxed to this nurse.  Reassured Pt

## 2019-04-13 NOTE — Telephone Encounter (Signed)
Follow up ° ° °Patient is returning your call. Please call. ° ° ° °

## 2019-04-13 NOTE — Telephone Encounter (Signed)
I help the pt send a manual transmission with his home remote monitor. Pt is concerned about feeling dizzy. I told him the nurse will review the transmissions to see if it is pacemaker related. I told him once the nurse review it she will give him a call back.

## 2019-04-13 NOTE — Telephone Encounter (Signed)
New message   Pt changed from [name] to Dr. Ladona Ridgel on Thursday. Pt said he was told he would be seen Monday, informed pt Dr. Ladona Ridgel is not in the office until Thursday. He said he was worried because he dizziness and called over the weekend and had some dizziness this morning.

## 2019-04-13 NOTE — Telephone Encounter (Signed)
Transmission reviewed. Normal PPM function. Presenting rhythm AS/VP 70s. RV capture appears appropriate. No episodes. Lead trends stable. AP 9.9%, VP 100%.

## 2019-04-13 NOTE — Telephone Encounter (Signed)
Returned call to Pt.  Pt saw PCP this AM and suggested dizziness was related to hypotension.    Pt will hold metoprolol and monitor blood pressure until follow up with [facility] on 3/4.  Pt indicates understanding and is in agreement with plan.

## 2019-04-14 LAB — CULTURE, BLOOD (ROUTINE X 2)
Culture: NO GROWTH
Culture: NO GROWTH
Special Requests: ADEQUATE
Special Requests: ADEQUATE

## 2019-04-16 ENCOUNTER — Ambulatory Visit: Payer: 59 | Admitting: Infectious Disease

## 2019-04-16 ENCOUNTER — Other Ambulatory Visit: Payer: Self-pay

## 2019-04-16 ENCOUNTER — Encounter: Payer: Self-pay | Admitting: Internal Medicine

## 2019-04-16 ENCOUNTER — Ambulatory Visit (INDEPENDENT_AMBULATORY_CARE_PROVIDER_SITE_OTHER): Payer: 59 | Admitting: Internal Medicine

## 2019-04-16 VITALS — BP 110/74 | HR 72 | Ht 65.0 in | Wt 250.8 lb

## 2019-04-16 DIAGNOSIS — Z950 Presence of cardiac pacemaker: Secondary | ICD-10-CM

## 2019-04-16 DIAGNOSIS — I442 Atrioventricular block, complete: Secondary | ICD-10-CM

## 2019-04-16 DIAGNOSIS — I330 Acute and subacute infective endocarditis: Secondary | ICD-10-CM | POA: Diagnosis not present

## 2019-04-16 LAB — CUP PACEART INCLINIC DEVICE CHECK
Battery Remaining Longevity: 142 mo
Battery Voltage: 3.21 V
Brady Statistic AP VP Percent: 5.17 %
Brady Statistic AP VS Percent: 0 %
Brady Statistic AS VP Percent: 94.82 %
Brady Statistic AS VS Percent: 0.01 %
Brady Statistic RA Percent Paced: 5.16 %
Brady Statistic RV Percent Paced: 99.99 %
Date Time Interrogation Session: 20210304100200
Implantable Lead Implant Date: 20151123
Implantable Lead Implant Date: 20210222
Implantable Lead Location: 753859
Implantable Lead Location: 753860
Implantable Lead Model: 5076
Implantable Lead Model: 5076
Implantable Pulse Generator Implant Date: 20210222
Lead Channel Impedance Value: 304 Ohm
Lead Channel Impedance Value: 361 Ohm
Lead Channel Impedance Value: 475 Ohm
Lead Channel Impedance Value: 608 Ohm
Lead Channel Pacing Threshold Amplitude: 0.5 V
Lead Channel Pacing Threshold Amplitude: 0.625 V
Lead Channel Pacing Threshold Pulse Width: 0.4 ms
Lead Channel Pacing Threshold Pulse Width: 0.4 ms
Lead Channel Sensing Intrinsic Amplitude: 1.625 mV
Lead Channel Sensing Intrinsic Amplitude: 12.5 mV
Lead Channel Setting Pacing Amplitude: 1.5 V
Lead Channel Setting Pacing Amplitude: 3.5 V
Lead Channel Setting Pacing Pulse Width: 0.4 ms
Lead Channel Setting Sensing Sensitivity: 5.6 mV

## 2019-04-16 NOTE — Progress Notes (Signed)
HPI Mr. 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 Since his procedure he has felt dizzy and lightheaded and we stopped his metoprolol. He is improved.  Allergies  Allergen Reactions  . Cefepime Other (See Comments)    Leukopenia not clear if due to vancomycin vs cefepime. WBC dropped down  . Vancomycin Other (See Comments)    ? Drug induced leukopenia vs being due to cefepime. WBC dropped down     Current Outpatient Medications  Medication Sig Dispense Refill  . acetaminophen (TYLENOL) 650 MG CR tablet Take 650 mg by mouth every 8 (eight) hours as needed for pain.    Marland Kitchen amoxicillin (AMOXIL) 500 MG capsule Take 1 capsule (500 mg total) by mouth 2 (two) times daily. 30 capsule 6  . aspirin EC 81 MG tablet Take 81 mg by mouth daily.     No current facility-administered medications for this visit.     Past Medical History:  Diagnosis Date  . AKI (acute kidney injury) ([place])   . Aortic valve vegetation on echo 04/29/14 04/29/2014   managed medically  . Brain abscess   . Congestive dilated cardiomyopathy ([place]) 06/23/2015  . Drug-induced leukopenia ([place]) 06/15/2014  . Endocarditis of aortic valve-November 2015   . Fracture of left femur ([place])   . History of open heart surgery    December 30, 2013- ascending aortic root replacement, TEE  . Meningitis    Group B Strep (November 2015)  . Obesity   . Patent foramen ovale    Closed during procedure on December 30, 2013  . Presence of permanent cardiac pacemaker 01/04/2014   PPM MEDTRONIC FOR COMPLETE HEART BLOCK  . 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 S/P aortic root and valve allograft 12/30/2013   Human allograft aortic root replacement with repair of aorta-right atrial fistula and tricuspid valve repair  . S/P placement of cardiac pacemaker 01/04/2014   Medtronic (serial number [id number]) dual chamber permanent pacemaker implanted by Dr  Lovena Le  . S/P redo patent foramen ovale closure 02/12/2014  . S/P redo tricuspid valve repair 02/12/2014   Complex valvuloplasty including patch closure of VSD with plication of septal leaflet and 26 mm Edwards mc3 ring annuloplasty  . S/P ventricular septal defect repair + redo tricuspid valve repair + redo closure PFO 02/12/2014   Redo sternotomy for bovine pericardial patch repair of large ventricular septal defect (recurrent LVOT to RA fistula due to bacterial endocarditis)  . Smokeless tobacco use   . Third degree heart block ([place]) 12/31/2013  . Thrombocytopenia ([place]) 12/2013  . Tricuspid regurgitation 02/11/2014  . Ventricular septal defect 02/12/2014   Post-infectious s/p repair of LVOT to RA fistula for bacterial endocarditis    ROS:   All systems reviewed and negative except as noted in the HPI.   Past Surgical History:  Procedure Laterality Date  . ASCENDING AORTIC ROOT REPLACEMENT N/A 12/30/2013   Procedure: ASCENDING AORTIC ROOT REPLACEMENT with 43mm HOMOGRAFT, CLOSURE OF AORTIC TO RIGHT ATRIAL FISTULA, CLOSURE OF PATENT FORAMEN OVALE;  Surgeon: Grace Isaac, MD;  Location: [facility];  Service: Open Heart Surgery;  Laterality: N/A;  . INSERT / REPLACE / REMOVE PACEMAKER  01/04/2014  . INTRAOPERATIVE TRANSESOPHAGEAL ECHOCARDIOGRAM N/A 12/30/2013   Procedure: INTRAOPERATIVE TRANSESOPHAGEAL ECHOCARDIOGRAM;  Surgeon: Grace Isaac, MD;  Location: [facility];  Service: Open Heart Surgery;  Laterality: N/A;  . PACEMAKER INSERTION Left 04/06/2019  Procedure: PACEMAKER LEAD INSERTION;  Surgeon: Marinus Maw, MD;  Location: [facility] OR;  Service: Cardiovascular;  Laterality: Left;  . PACEMAKER LEAD REMOVAL N/A 04/06/2019   Procedure: PACEMAKER LEAD REMOVAL;  Surgeon: Marinus Maw, MD;  Location: [facility] OR;  Service: Cardiovascular;  Laterality: N/A;  . PERMANENT PACEMAKER INSERTION N/A 01/04/2014   Procedure: PERMANENT PACEMAKER INSERTION;  Surgeon: Marinus Maw, MD;  [facility] LAB;   Service: Cardiovascular;  Laterality: N/A;  . RIGHT HEART CATHETERIZATION N/A 02/12/2014   Procedure: RIGHT HEART CATH;  Surgeon: Laurey Morale, MD;  Location: [facility] CATH LAB;  Service: Cardiovascular;  Laterality: N/A;  . TEE WITHOUT CARDIOVERSION N/A 02/12/2014   Procedure: TRANSESOPHAGEAL ECHOCARDIOGRAM (TEE);  Surgeon: Quintella Reichert, MD;  Location: [facility] ENDOSCOPY;  Service: Cardiovascular;  Laterality: N/A;  . TEE WITHOUT CARDIOVERSION N/A 04/30/2014   Procedure: TRANSESOPHAGEAL ECHOCARDIOGRAM (TEE);  Surgeon: Jake Bathe, MD;  Location: [facility] ENDOSCOPY;  Service: Cardiovascular;  Laterality: N/A;  . TRICUSPID VALVE REPLACEMENT N/A 12/30/2013   Procedure: REPAIR OF TRICUSPID VALVE LEAFLET;  Surgeon: Delight Ovens, MD;  Location: [facility] OR;  Service: Open Heart Surgery;  Laterality: N/A;  . TRICUSPID VALVE REPLACEMENT N/A 02/12/2014   Procedure: TRICUSPID VALVE REPAIR, REDO MEDIAN STERNOTOMY, REPAIR OF VENTRICULAR SEPTAL DEFECT (RECURRENT LV OUTFLOW TRACT TO RIGHT ATRIAL FISTULA), REDO CLOSURE OF PATENT FORAMEN OVALE;  Surgeon: Purcell Nails, MD;  Location: [facility] OR;  Service: Open Heart Surgery;  Laterality: N/A;  . VENOGRAM Left 04/06/2019   Procedure: Venogram;  Surgeon: Marinus Maw, MD;  Location: [facility] OR;  Service: Cardiovascular;  Laterality: Left;     Family History  Problem Relation Age of Onset  . Heart murmur Brother      Social History   Socioeconomic History  . Marital status: Married    Spouse name: Not on file  . Number of children: Not on file  . Years of education: Not on file  . Highest education level: Not on file  Occupational History  . Not on file  Tobacco Use  . Smoking status: Never Smoker  . Smokeless tobacco: Former Engineer, water and Sexual Activity  . Alcohol use: Yes    Alcohol/week: 0.0 standard drinks    Comment: socially  . Drug use: No  . Sexual activity: Yes    Birth control/protection: Condom  Other Topics Concern  . Not on file  Social History  Narrative  . Not on file   Social Determinants of Health   Financial Resource Strain:   . Difficulty of Paying Living Expenses: Not on file  Food Insecurity:   . Worried About Programme researcher, broadcasting/film/video in the Last Year: Not on file  . Ran Out of Food in the Last Year: Not on file  Transportation Needs:   . Lack of Transportation (Medical): Not on file  . Lack of Transportation (Non-Medical): Not on file  Physical Activity:   . Days of Exercise per Week: Not on file  . Minutes of Exercise per Session: Not on file  Stress:   . Feeling of Stress : Not on file  Social Connections:   . Frequency of Communication with Friends and Family: Not on file  . Frequency of Social Gatherings with Friends and Family: Not on file  . Attends Religious Services: Not on file  . Active Member of Clubs or Organizations: Not on file  . Attends Banker Meetings: Not on file  . Marital Status: Not on  file  Intimate Partner Violence:   . Fear of Current or Ex-Partner: Not on file  . Emotionally Abused: Not on file  . Physically Abused: Not on file  . Sexually Abused: Not on file     BP 110/74   Pulse 72   Ht 5\' 5"  (1.651 m)   Wt 250 lb 12.8 oz (113.8 kg)   SpO2 98%   BMI 41.74 kg/m   Physical Exam:  Well appearing NAD HEENT: Unremarkable Neck:  No JVD, no thyromegally Lymphatics:  No adenopathy Back:  No CVA tenderness Lungs:  Clear with no wheezes HEART:  Regular rate rhythm, no murmurs, no rubs, no clicks Abd:  soft, positive bowel sounds, no organomegally, no rebound, no guarding Ext:  2 plus pulses, no edema, no cyanosis, no clubbing Skin:  No rashes no nodules Neuro:  CN II through XII intact, motor grossly intact  EKG - P synchronous ventricular pacing  DEVICE  Normal device function.  See PaceArt for details.   Assess/Plan: 1. CHB - he is s/p PPM lead revsion.  2. PPM lead failure - he is s/p extraction and re-implantation. 3. Fatigue and weakness - he has stopped  his amiodarone and his symptoms have resolved. 4. Obesity - I strongly encouraged the patient to start exercising.  .D.

## 2019-04-16 NOTE — Patient Instructions (Signed)
Medication Instructions:  Your physician recommends that you continue on your current medications as directed. Please refer to the Current Medication list given to you today.  Labwork: None ordered.  Testing/Procedures: None ordered.  Follow-Up: Your physician wants you to follow-up in: follow up as previously scheduled.  Any Other Special Instructions Will Be Listed Below (If Applicable).  If you need a refill on your cardiac medications before your next appointment, please call your pharmacy.

## 2019-04-17 ENCOUNTER — Encounter: Payer: 59 | Admitting: Student

## 2019-05-04 ENCOUNTER — Telehealth: Payer: Self-pay | Admitting: Internal Medicine

## 2019-05-04 ENCOUNTER — Encounter: Payer: Self-pay | Admitting: Emergency Medicine

## 2019-05-04 NOTE — Telephone Encounter (Signed)
Patient states he needs a note stating he can get a massage because of his pacemaker surgery. He says his appointment is today at 11:30am.

## 2019-05-04 NOTE — Telephone Encounter (Signed)
Spoke with patient and he is scheduled for lower back massage. He reports no TENS unit is going to be used . Patient has had issues with his lower back. Dr Ladona Ridgel advises that a back massage is OK  and to avoid any contact with chest and device area. With patient permission contacted [employer] in [facility] @ [phone] and [facility] to call clinic.

## 2019-05-04 NOTE — Telephone Encounter (Signed)
Randi from [employer] returning call. Requested that letter documenting patient may have back massage should be e-mailed to [email or web address]. Will not accept verbal verification of clearance.Letter e-mailed .

## 2019-07-06 ENCOUNTER — Ambulatory Visit (INDEPENDENT_AMBULATORY_CARE_PROVIDER_SITE_OTHER): Payer: 59 | Admitting: *Deleted

## 2019-07-06 DIAGNOSIS — I442 Atrioventricular block, complete: Secondary | ICD-10-CM

## 2019-07-06 LAB — CUP PACEART REMOTE DEVICE CHECK
Battery Remaining Longevity: 111 mo
Battery Voltage: 3.16 V
Brady Statistic AP VP Percent: 5.32 %
Brady Statistic AP VS Percent: 0 %
Brady Statistic AS VP Percent: 94.67 %
Brady Statistic AS VS Percent: 0.02 %
Brady Statistic RA Percent Paced: 5.31 %
Brady Statistic RV Percent Paced: 99.98 %
Date Time Interrogation Session: 20210523201217
Implantable Lead Implant Date: 20151123
Implantable Lead Implant Date: 20210222
Implantable Lead Location: 753859
Implantable Lead Location: 753860
Implantable Lead Model: 5076
Implantable Lead Model: 5076
Implantable Pulse Generator Implant Date: 20210222
Lead Channel Impedance Value: 304 Ohm
Lead Channel Impedance Value: 342 Ohm
Lead Channel Impedance Value: 418 Ohm
Lead Channel Impedance Value: 589 Ohm
Lead Channel Pacing Threshold Amplitude: 0.5 V
Lead Channel Pacing Threshold Amplitude: 1 V
Lead Channel Pacing Threshold Pulse Width: 0.4 ms
Lead Channel Pacing Threshold Pulse Width: 0.4 ms
Lead Channel Sensing Intrinsic Amplitude: 1.125 mV
Lead Channel Sensing Intrinsic Amplitude: 1.125 mV
Lead Channel Sensing Intrinsic Amplitude: 12.5 mV
Lead Channel Setting Pacing Amplitude: 1.5 V
Lead Channel Setting Pacing Amplitude: 3.5 V
Lead Channel Setting Pacing Pulse Width: 0.4 ms
Lead Channel Setting Sensing Sensitivity: 5.6 mV

## 2019-07-06 NOTE — Progress Notes (Signed)
Remote pacemaker transmission.   

## 2019-07-15 ENCOUNTER — Encounter: Payer: 59 | Admitting: Internal Medicine

## 2019-09-09 ENCOUNTER — Telehealth: Payer: Self-pay | Admitting: Internal Medicine

## 2019-09-09 NOTE — Telephone Encounter (Signed)
Patient is calling in regards to his pacemaker. He states he is beginning to have issues with new pacemaker, inserted a few months back. He  has been experiencing chest soreness and a hollow pocket on the left side of his chest. In addition, he states it feels like his pacemaker has moved from where it was initially. Please call.

## 2019-09-09 NOTE — Telephone Encounter (Signed)
Patient reports intermittent pain with movement in left chest near pacemaker. Reports he has pain rated 1/10 that he describes as "muscle soreness" in the area when he  Uses his left arm to drive and when he reaches with his lleft arm to do things. Reports that incision site in closed, has no discoloration or drainage. He does report the device appears to have moved in the pocket and he did report a 20 lb weight loss since the generator change on 04/06/19. He will send a photo of the pacemaker pocket to be assessed. No active CP, SOB , or dizziness. Will assess photo and form treatment plan from there.

## 2019-09-11 NOTE — Telephone Encounter (Signed)
Picture received of device site. No visible edema, drainage or redness.Patient reports decreased pain with movement of LUE today. Advised patient to take Tylenol  2 tablets prn for pain every 6 hours. Patient will call with update on his condition Monday.

## 2019-09-17 NOTE — Telephone Encounter (Signed)
Called patient to reassess site. No answer. Unable to leace VM d/t mailbox full.

## 2019-10-05 ENCOUNTER — Ambulatory Visit (INDEPENDENT_AMBULATORY_CARE_PROVIDER_SITE_OTHER): Payer: 59 | Admitting: *Deleted

## 2019-10-05 DIAGNOSIS — I442 Atrioventricular block, complete: Secondary | ICD-10-CM

## 2019-10-06 ENCOUNTER — Telehealth: Payer: Self-pay | Admitting: Internal Medicine

## 2019-10-06 ENCOUNTER — Encounter (HOSPITAL_BASED_OUTPATIENT_CLINIC_OR_DEPARTMENT_OTHER): Payer: Self-pay | Admitting: *Deleted

## 2019-10-06 ENCOUNTER — Other Ambulatory Visit: Payer: Self-pay

## 2019-10-06 DIAGNOSIS — Z5321 Procedure and treatment not carried out due to patient leaving prior to being seen by health care provider: Secondary | ICD-10-CM | POA: Diagnosis not present

## 2019-10-06 DIAGNOSIS — Z950 Presence of cardiac pacemaker: Secondary | ICD-10-CM | POA: Insufficient documentation

## 2019-10-06 DIAGNOSIS — R42 Dizziness and giddiness: Secondary | ICD-10-CM | POA: Insufficient documentation

## 2019-10-06 DIAGNOSIS — R002 Palpitations: Secondary | ICD-10-CM | POA: Insufficient documentation

## 2019-10-06 LAB — CUP PACEART REMOTE DEVICE CHECK
Battery Remaining Longevity: 138 mo
Battery Voltage: 3.12 V
Brady Statistic AP VP Percent: 5.21 %
Brady Statistic AP VS Percent: 0 %
Brady Statistic AS VP Percent: 94.77 %
Brady Statistic AS VS Percent: 0.01 %
Brady Statistic RA Percent Paced: 5.2 %
Brady Statistic RV Percent Paced: 99.99 %
Date Time Interrogation Session: 20210822202529
Implantable Lead Implant Date: 20151123
Implantable Lead Implant Date: 20210222
Implantable Lead Location: 753859
Implantable Lead Location: 753860
Implantable Lead Model: 5076
Implantable Lead Model: 5076
Implantable Pulse Generator Implant Date: 20210222
Lead Channel Impedance Value: 323 Ohm
Lead Channel Impedance Value: 361 Ohm
Lead Channel Impedance Value: 494 Ohm
Lead Channel Impedance Value: 684 Ohm
Lead Channel Pacing Threshold Amplitude: 0.5 V
Lead Channel Pacing Threshold Amplitude: 0.875 V
Lead Channel Pacing Threshold Pulse Width: 0.4 ms
Lead Channel Pacing Threshold Pulse Width: 0.4 ms
Lead Channel Sensing Intrinsic Amplitude: 1.25 mV
Lead Channel Sensing Intrinsic Amplitude: 1.25 mV
Lead Channel Sensing Intrinsic Amplitude: 12.5 mV
Lead Channel Setting Pacing Amplitude: 1.5 V
Lead Channel Setting Pacing Amplitude: 2.5 V
Lead Channel Setting Pacing Pulse Width: 0.4 ms
Lead Channel Setting Sensing Sensitivity: 5.6 mV

## 2019-10-06 NOTE — ED Triage Notes (Signed)
Yesterday and today he had rapid heart rate and dizziness. States he has a pacemaker as a result of endocarditis  6 years ago.

## 2019-10-06 NOTE — Telephone Encounter (Signed)
Carelink report received and reviewed. No abnormal findings noted.   Patient called to discuss. Patient advised if he has any further questions or concerns to please call DC at [phone].   Reminded patient of upcoming apt. 10/08/19 at 3:15 pm.

## 2019-10-06 NOTE — Telephone Encounter (Signed)
Patient called wanting to get some advise on his symptoms. He states he has been lethargic, lightheaded at all times but it is worse when he's moving, elevated heart rate (right now it is 112 while laying down, usually 80), some sob (not bad but he went to go get covid tested yesterday - they advised they don't think he is positive because he is not showing any other symptoms, no chest discomfort, chest tightness or pain that he has noticed.

## 2019-10-06 NOTE — Telephone Encounter (Signed)
Spoke to patient he stated for the past 4 to 5 days he has been light headed,sob,lethargic,hard to concentrate.No chest pain.Stated B/P has been normal.Stated he went to a Urgent Care yesterday was told he had fluid behind one of his ears.Stated he already had covid but a covid test was done.Stated he would like to be seen.Appointment scheduled with Azalee Course PA 8/26 at 3:15 pm.Stated he will call and cancel if covid test positive.  Patient called back stated he sent in pacemaker transmission about 2 hours ago.He would like to make sure pacemaker is ok.Advised I will send message to device clinic.

## 2019-10-06 NOTE — Telephone Encounter (Signed)
Will send to primary card DR. Crenshaw.

## 2019-10-07 ENCOUNTER — Emergency Department (HOSPITAL_BASED_OUTPATIENT_CLINIC_OR_DEPARTMENT_OTHER)
Admission: EM | Admit: 2019-10-07 | Discharge: 2019-10-07 | Disposition: A | Discharge status: Home / Self Care | Payer: 59 | Attending: Emergency Medicine | Admitting: Emergency Medicine

## 2019-10-07 ENCOUNTER — Telehealth: Payer: Self-pay | Admitting: Cardiology

## 2019-10-07 NOTE — ED Notes (Signed)
Second call for [facility]  No response from lobby

## 2019-10-07 NOTE — ED Notes (Signed)
Called  No response from lobby 

## 2019-10-07 NOTE — Telephone Encounter (Signed)
Spoke to patient he stated he continues to be light headed and dizzy.He has been having numbness in middle of lip.Stated he is very anxious and worried.Stated he was prescribed prednisone dose pack when he went to Urgent Care this past Monday.Stated he has took 2 doses.Advised Prednisone could be causing you to feel more anxious.Advised to rest and stop worrying.Advised to keep appointment already scheduled with Azalee Course PA tomorrow 8/26 at 3:15 pm.Advised to go to ED if symptoms worsen.

## 2019-10-07 NOTE — Telephone Encounter (Signed)
Patient called and said that face is numb on one side and bp and pulse continues to fluctuate. Wants to speak with a nurse

## 2019-10-08 ENCOUNTER — Encounter: Payer: Self-pay | Admitting: Physician Assistant

## 2019-10-08 ENCOUNTER — Other Ambulatory Visit: Payer: Self-pay

## 2019-10-08 ENCOUNTER — Telehealth: Payer: Self-pay

## 2019-10-08 ENCOUNTER — Ambulatory Visit: Payer: 59 | Admitting: Physician Assistant

## 2019-10-08 VITALS — BP 118/80 | HR 95 | Ht 65.0 in | Wt 233.0 lb

## 2019-10-08 DIAGNOSIS — Q210 Ventricular septal defect: Secondary | ICD-10-CM

## 2019-10-08 DIAGNOSIS — I359 Nonrheumatic aortic valve disorder, unspecified: Secondary | ICD-10-CM | POA: Diagnosis not present

## 2019-10-08 DIAGNOSIS — I330 Acute and subacute infective endocarditis: Secondary | ICD-10-CM | POA: Diagnosis not present

## 2019-10-08 DIAGNOSIS — R42 Dizziness and giddiness: Secondary | ICD-10-CM | POA: Diagnosis not present

## 2019-10-08 DIAGNOSIS — Z950 Presence of cardiac pacemaker: Secondary | ICD-10-CM

## 2019-10-08 LAB — CUP PACEART REMOTE DEVICE CHECK
Battery Remaining Longevity: 140 mo
Battery Voltage: 3.11 V
Brady Statistic AP VP Percent: 16.71 %
Brady Statistic AP VS Percent: 0 %
Brady Statistic AS VP Percent: 83.28 %
Brady Statistic AS VS Percent: 0.01 %
Brady Statistic RA Percent Paced: 16.68 %
Brady Statistic RV Percent Paced: 99.99 %
Date Time Interrogation Session: 20210826135027
Implantable Lead Implant Date: 20151123
Implantable Lead Implant Date: 20210222
Implantable Lead Location: 753859
Implantable Lead Location: 753860
Implantable Lead Model: 5076
Implantable Lead Model: 5076
Implantable Pulse Generator Implant Date: 20210222
Lead Channel Impedance Value: 323 Ohm
Lead Channel Impedance Value: 361 Ohm
Lead Channel Impedance Value: 532 Ohm
Lead Channel Impedance Value: 722 Ohm
Lead Channel Pacing Threshold Amplitude: 0.5 V
Lead Channel Pacing Threshold Amplitude: 0.75 V
Lead Channel Pacing Threshold Pulse Width: 0.4 ms
Lead Channel Pacing Threshold Pulse Width: 0.4 ms
Lead Channel Sensing Intrinsic Amplitude: 1.5 mV
Lead Channel Sensing Intrinsic Amplitude: 1.5 mV
Lead Channel Sensing Intrinsic Amplitude: 12.5 mV
Lead Channel Setting Pacing Amplitude: 1.5 V
Lead Channel Setting Pacing Amplitude: 2.5 V
Lead Channel Setting Pacing Pulse Width: 0.4 ms
Lead Channel Setting Sensing Sensitivity: 5.6 mV

## 2019-10-08 NOTE — Progress Notes (Deleted)
Cardiology Office Note:    Date:  10/08/2019   ID:  [name], DOB Jul 19, 1985, MRN [id number]  PCP:  [name], MD  [facility] Cardiologist:  Olga Millers, MD  [facility] Electrophysiologist:  [name], MD   Referring MD: [name], MD   No chief complaint on file. ***  History of Present Illness:    [name] is a 34 y.o. male with a hx of AVR, CHB s/p PPM, thrombocytopenia.  He had bacterial endocarditis in November 2015 and subsequently underwent aortic root and aortic valve replacement.  TEE performed in January 2016 revealed a large VSD with left-to-right shunting from LVOT.  He underwent redo median sternotomy and repair of VSD, tricuspid valve repair and redo closure of PFO in January 2016.  He had a repeat echocardiogram in March 2016 that showed mild LV dysfunction, mobile mass in the aortic valve concerning for vegetation as well as mild to moderate aortic insufficiency.  He was treated with another course of IV antibiotic.  Blood culture remained negative.  TEE showed low normal LV function and aortic valve mass.  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  He was evaluated by Dr. Nydia Bouton and medical therapy was recommended.  Repeat echocardiogram in August 2019 so showed EF 40 to 45%, bioprosthetic aortic valve with normal function, mean gradient 22 mmHg, mild AI, mild to moderate RV dysfunction.  He presented to the ED in February 2021 was dizziness and dyspnea and found to have RV lead failure.  He eventually underwent lead extraction on 04/06/2019 by Dr. Ladona Ridgel, a new Medtronic RV lead was placed and a Medtronic dual-chamber pacemaker was used to replace the previous pacemaker.  Due to persistent dizziness, metoprolol was discontinued.  Recent device interrogation showed normal device function, 3 AT/AF longest was 7 minutes.  He presented to the ED on the night of 8/24 with rapid heartbeat and dizziness.   Past Medical  History:  Diagnosis Date  . AKI (acute kidney injury) (HCC)   . Aortic valve vegetation on echo 04/29/14 04/29/2014   managed medically  . Brain abscess   . Congestive dilated cardiomyopathy (HCC) 06/23/2015  . Drug-induced leukopenia (HCC) 06/15/2014  . Endocarditis of aortic valve-November 2015   . Fracture of left femur (HCC)   . History of open heart surgery    December 30, 2013- ascending aortic root replacement, TEE  . Meningitis    Group B Strep (November 2015)  . Obesity   . Patent foramen ovale    Closed during procedure on December 30, 2013  . Presence of permanent cardiac pacemaker 01/04/2014   PPM MEDTRONIC FOR COMPLETE HEART BLOCK  . 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 S/P aortic root and valve allograft 12/30/2013   Human allograft aortic root replacement with repair of aorta-right atrial fistula and tricuspid valve repair  . S/P placement of cardiac pacemaker 01/04/2014   Medtronic (serial number [id number]) dual chamber permanent pacemaker implanted by Dr Ladona Ridgel  . S/P redo patent foramen ovale closure 02/12/2014  . S/P redo tricuspid valve repair 02/12/2014   Complex valvuloplasty including patch closure of VSD with plication of septal leaflet and 26 mm Edwards mc3 ring annuloplasty  . S/P ventricular septal defect repair + redo tricuspid valve repair + redo closure PFO 02/12/2014   Redo sternotomy for bovine pericardial patch repair of large ventricular septal defect (recurrent LVOT to RA fistula due to bacterial endocarditis)  . Smokeless tobacco use   .  Third degree heart block (HCC) 12/31/2013  . Thrombocytopenia (HCC) 12/2013  . Tricuspid regurgitation 02/11/2014  . Ventricular septal defect 02/12/2014   Post-infectious s/p repair of LVOT to RA fistula for bacterial endocarditis    Past Surgical History:  Procedure Laterality Date  . ASCENDING AORTIC ROOT REPLACEMENT N/A 12/30/2013   Procedure: ASCENDING AORTIC ROOT REPLACEMENT with 61mm HOMOGRAFT,  CLOSURE OF AORTIC TO RIGHT ATRIAL FISTULA, CLOSURE OF PATENT FORAMEN OVALE;  Surgeon: Delight Ovens, MD;  Location: [facility] OR;  Service: Open Heart Surgery;  Laterality: N/A;  . INSERT / REPLACE / REMOVE PACEMAKER  01/04/2014  . INTRAOPERATIVE TRANSESOPHAGEAL ECHOCARDIOGRAM N/A 12/30/2013   Procedure: INTRAOPERATIVE TRANSESOPHAGEAL ECHOCARDIOGRAM;  Surgeon: Delight Ovens, MD;  Location: [facility] OR;  Service: Open Heart Surgery;  Laterality: N/A;  . PACEMAKER INSERTION Left 04/06/2019   Procedure: PACEMAKER LEAD INSERTION;  Surgeon: Marinus Maw, MD;  Location: [facility] OR;  Service: Cardiovascular;  Laterality: Left;  . PACEMAKER LEAD REMOVAL N/A 04/06/2019   Procedure: PACEMAKER LEAD REMOVAL;  Surgeon: Marinus Maw, MD;  Location: [facility] OR;  Service: Cardiovascular;  Laterality: N/A;  . PERMANENT PACEMAKER INSERTION N/A 01/04/2014   Procedure: PERMANENT PACEMAKER INSERTION;  Surgeon: Marinus Maw, MD;  Location: [facility] CATH LAB;  Service: Cardiovascular;  Laterality: N/A;  . RIGHT HEART CATHETERIZATION N/A 02/12/2014   Procedure: RIGHT HEART CATH;  Surgeon: Laurey Morale, MD;  Location: [facility] CATH LAB;  Service: Cardiovascular;  Laterality: N/A;  . TEE WITHOUT CARDIOVERSION N/A 02/12/2014   Procedure: TRANSESOPHAGEAL ECHOCARDIOGRAM (TEE);  Surgeon: Quintella Reichert, MD;  Location: [facility] ENDOSCOPY;  Service: Cardiovascular;  Laterality: N/A;  . TEE WITHOUT CARDIOVERSION N/A 04/30/2014   Procedure: TRANSESOPHAGEAL ECHOCARDIOGRAM (TEE);  Surgeon: Jake Bathe, MD;  Location: [facility] ENDOSCOPY;  Service: Cardiovascular;  Laterality: N/A;  . TRICUSPID VALVE REPLACEMENT N/A 12/30/2013   Procedure: REPAIR OF TRICUSPID VALVE LEAFLET;  Surgeon: Delight Ovens, MD;  Location: [facility] OR;  Service: Open Heart Surgery;  Laterality: N/A;  . TRICUSPID VALVE REPLACEMENT N/A 02/12/2014   Procedure: TRICUSPID VALVE REPAIR, REDO MEDIAN STERNOTOMY, REPAIR OF VENTRICULAR SEPTAL DEFECT (RECURRENT LV OUTFLOW TRACT TO RIGHT ATRIAL FISTULA), REDO  CLOSURE OF PATENT FORAMEN OVALE;  Surgeon: Purcell Nails, MD;  Location: [facility] OR;  Service: Open Heart Surgery;  Laterality: N/A;  . VENOGRAM Left 04/06/2019   Procedure: Venogram;  Surgeon: Marinus Maw, MD;  Location: [facility] OR;  Service: Cardiovascular;  Laterality: Left;    Current Medications: No outpatient medications have been marked as taking for the 10/08/19 encounter (Appointment) with Azalee Course, PA.     Allergies:   Cefepime and Vancomycin   Social History   Socioeconomic History  . Marital status: Married    Spouse name: Not on file  . Number of children: Not on file  . Years of education: Not on file  . Highest education level: Not on file  Occupational History  . Not on file  Tobacco Use  . Smoking status: Never Smoker  . Smokeless tobacco: Former Engineer, water and Sexual Activity  . Alcohol use: Yes    Alcohol/week: 0.0 standard drinks    Comment: socially  . Drug use: No  . Sexual activity: Yes    Birth control/protection: Condom  Other Topics Concern  . Not on file  Social History Narrative  . Not on file   Social Determinants of Health   Financial Resource Strain:   . Difficulty of Paying Living Expenses: Not on file  Food Insecurity:   . Worried About Programme researcher, broadcasting/film/video in the Last Year: Not on file  . Ran Out of Food in the Last Year: Not on file  Transportation Needs:   . Lack of Transportation (Medical): Not on file  . Lack of Transportation (Non-Medical): Not on file  Physical Activity:   . Days of Exercise per Week: Not on file  . Minutes of Exercise per Session: Not on file  Stress:   . Feeling of Stress : Not on file  Social Connections:   . Frequency of Communication with Friends and Family: Not on file  . Frequency of Social Gatherings with Friends and Family: Not on file  . Attends Religious Services: Not on file  . Active Member of Clubs or Organizations: Not on file  . Attends Banker Meetings: Not on file  . Marital  Status: Not on file     Family History: The patient's ***family history includes Heart murmur in his brother.  ROS:   Please see the history of present illness.    *** All other systems reviewed and are negative.  EKGs/Labs/Other Studies Reviewed:    The following studies were reviewed today: ***  EKG:  EKG is *** ordered today.  The ekg ordered today demonstrates ***  Recent Labs: 04/06/2019: BUN 18; Creatinine, Ser 1.21; Hemoglobin 15.1; Platelets 199; Potassium 3.8; Sodium 141  Recent Lipid Panel    Component Value Date/Time   TRIG 119 12/29/2013 2200    Physical Exam:    VS:  There were no vitals taken for this visit.    Wt Readings from Last 3 Encounters:  10/06/19 250 lb 14.1 oz (113.8 kg)  04/16/19 250 lb 12.8 oz (113.8 kg)  04/06/19 252 lb 8 oz (114.5 kg)     GEN: *** Well nourished, well developed in no acute distress HEENT: Normal NECK: No JVD; No carotid bruits LYMPHATICS: No lymphadenopathy CARDIAC: ***RRR, no murmurs, rubs, gallops RESPIRATORY:  Clear to auscultation without rales, wheezing or rhonchi  ABDOMEN: Soft, non-tender, non-distended MUSCULOSKELETAL:  No edema; No deformity  SKIN: Warm and dry NEUROLOGIC:  Alert and oriented x 3 PSYCHIATRIC:  Normal affect   ASSESSMENT:    No diagnosis found. PLAN:    In order of problems listed above:  1. ***   Medication Adjustments/Labs and Tests Ordered: Current medicines are reviewed at length with the patient today.  Concerns regarding medicines are outlined above.  No orders of the defined types were placed in this encounter.  No orders of the defined types were placed in this encounter.   There are no Patient Instructions on file for this visit.   Ramond Dial, [place]  10/08/2019 2:57 PM    [facility]

## 2019-10-08 NOTE — Patient Instructions (Signed)
Medication Instructions:  No Changes In Medications at this time.   *If you need a refill on your cardiac medications before your next appointment, please call your pharmacy*   Lab Work: BMET, CBC and Blood cultures today If you have labs (blood work) drawn today and your tests are completely normal, you will receive your results only by: Marland Kitchen MyChart Message (if you have MyChart) OR . A paper copy in the mail If you have any lab test that is abnormal or we need to change your treatment, we will call you to review the results.   Testing/Procedures: Your physician has requested that you have an echocardiogram. Echocardiography is a painless test that uses sound waves to create images of your heart. It provides your doctor with information about the size and shape of your heart and how well your heart's chambers and valves are working. This procedure takes approximately one hour. There are no restrictions for this procedure. This will be done tomorrow 10/09/19 at 3pm at [facility], please come through Entrance C.     Follow-Up: At [facility], you and your health needs are our priority.  As part of our continuing mission to provide you with exceptional heart care, we have created designated Provider Care Teams.  These Care Teams include your primary Cardiologist (physician) and Advanced Practice Providers (APPs -  Physician Assistants and Nurse Practitioners) who all work together to provide you with the care you need, when you need it.  We recommend signing up for the patient portal called "MyChart".  Sign up information is provided on this After Visit Summary.  MyChart is used to connect with patients for Virtual Visits (Telemedicine).  Patients are able to view lab/test results, encounter notes, upcoming appointments, etc.  Non-urgent messages can be sent to your provider as well.   To learn more about what you can do with MyChart, go to [email or web address].    Your next  appointment:   2 week(s)  The format for your next appointment:   In Person  Provider:   Azalee Course, PA-C   Other Instructions

## 2019-10-08 NOTE — Telephone Encounter (Signed)
Patient called office today confused regarding plan of care. States his cardiologist reviewed ID note and noticed patient was supposed to be on Amoxicillin. States he was not aware of this and thought he was done with treatment.  Patient is scheduled for a ultrasound this week. Would like to hold off on appointment with office until blood cultures and [place] results are back. Will schedule appointment for patient to see MD later this week to discuss plan of care. Will ask MD if patient should start Amoxicillin that was prescribed by cardiology based off ID note. Patient is currently not taking any antibiotics Lorenso Courier, CMA

## 2019-10-08 NOTE — Progress Notes (Signed)
Cardiology Office Note:    Date:  10/09/2019   ID:  [name], DOB [birth year], MRN [id number]  PCP:  [name], MD  [facility] Cardiologist:  Olga Millers, MD  [facility] Electrophysiologist:  [name], MD   Referring MD: [name], MD   Chief Complaint  Patient presents with  . Follow-up    seen for Dr. Jens Som    History of Present Illness:    [name] is a 34 y.o. male with a hx of AVR, CHB s/p PPM, thrombocytopenia.  He had bacterial endocarditis in November 2015 and subsequently underwent aortic root and aortic valve replacement.  TEE performed in January 2016 revealed a large VSD with left-to-right shunting from LVOT.  He underwent redo median sternotomy and repair of VSD, tricuspid valve repair and redo closure of PFO in January 2016.  He had a repeat echocardiogram in March 2016 that showed mild LV dysfunction, mobile mass in the aortic valve concerning for vegetation as well as mild to moderate aortic insufficiency.  He was treated with another course of IV antibiotic.  Blood culture remained negative.  TEE showed low normal LV function and aortic valve mass.  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  He was evaluated by Dr. Nydia Bouton and medical therapy was recommended.  Repeat echocardiogram in August 2019 so showed EF 40 to 45%, bioprosthetic aortic valve with normal function, mean gradient 22 mmHg, mild AI, mild to moderate RV dysfunction.  He presented to the ED in February 2021 was dizziness and dyspnea and found to have RV lead failure.  He eventually underwent lead extraction on 04/06/2019 by Dr. Ladona Ridgel, a new Medtronic RV lead was placed and a Medtronic dual-chamber pacemaker was used to replace the previous pacemaker. Due to persistent dizziness, metoprolol was discontinued.  Recent device interrogation showed normal device function, 3 AT/AF longest was 7 minutes.  He presented to the ED on the night of 8/24 with  rapid heartbeat and dizziness.  Patient presents today for evaluation of dizziness that has been going on for the past 5 days.  According to the patient ever since he discontinued the metoprolol, his dizziness went away.  He says he was told to come off antibiotics since February after his procedure.  He denies any recent fever, chill, cough.  He does have occasional sharp pain in the left rib that lasted about several seconds.  This is clearly noncardiac in nature.  Even though recent device interrogation showed up to 7 minutes of atrial tachycardia or A. fib, the symptom actually persist throughout the entire day, therefore I suspect the recent atrial tachycardia is more of a incidental finding.  He is dizzy spell does not change based on body position.  I recommend a CBC, basic metabolic panel, 2 blood cultures and echocardiogram as initial evaluation.   Past Medical History:  Diagnosis Date  . AKI (acute kidney injury) (HCC)   . Aortic valve vegetation on echo 04/29/14 04/29/2014   managed medically  . Brain abscess   . Congestive dilated cardiomyopathy (HCC) 06/23/2015  . Drug-induced leukopenia (HCC) 06/15/2014  . Endocarditis of aortic valve-November 2015   . Fracture of left femur (HCC)   . History of open heart surgery    December 30, 2013- ascending aortic root replacement, TEE  . Meningitis    Group B Strep (November 2015)  . Obesity   . Patent foramen ovale    Closed during procedure on December 30, 2013  .  Presence of permanent cardiac pacemaker 01/04/2014   PPM MEDTRONIC FOR COMPLETE HEART BLOCK  . 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 S/P aortic root and valve allograft 12/30/2013   Human allograft aortic root replacement with repair of aorta-right atrial fistula and tricuspid valve repair  . S/P placement of cardiac pacemaker 01/04/2014   Medtronic (serial number [id number]) dual chamber permanent pacemaker implanted by Dr Ladona Ridgel  . S/P redo patent foramen ovale  closure 02/12/2014  . S/P redo tricuspid valve repair 02/12/2014   Complex valvuloplasty including patch closure of VSD with plication of septal leaflet and 26 mm Edwards mc3 ring annuloplasty  . S/P ventricular septal defect repair + redo tricuspid valve repair + redo closure PFO 02/12/2014   Redo sternotomy for bovine pericardial patch repair of large ventricular septal defect (recurrent LVOT to RA fistula due to bacterial endocarditis)  . Smokeless tobacco use   . Third degree heart block (HCC) 12/31/2013  . Thrombocytopenia (HCC) 12/2013  . Tricuspid regurgitation 02/11/2014  . Ventricular septal defect 02/12/2014   Post-infectious s/p repair of LVOT to RA fistula for bacterial endocarditis    Past Surgical History:  Procedure Laterality Date  . ASCENDING AORTIC ROOT REPLACEMENT N/A 12/30/2013   Procedure: ASCENDING AORTIC ROOT REPLACEMENT with 51mm HOMOGRAFT, CLOSURE OF AORTIC TO RIGHT ATRIAL FISTULA, CLOSURE OF PATENT FORAMEN OVALE;  Surgeon: Delight Ovens, MD;  Location: [facility] OR;  Service: Open Heart Surgery;  Laterality: N/A;  . INSERT / REPLACE / REMOVE PACEMAKER  01/04/2014  . INTRAOPERATIVE TRANSESOPHAGEAL ECHOCARDIOGRAM N/A 12/30/2013   Procedure: INTRAOPERATIVE TRANSESOPHAGEAL ECHOCARDIOGRAM;  Surgeon: Delight Ovens, MD;  Location[facility] OR;  Service: Open Heart Surgery;  Laterality: N/A;  . PACEMAKER INSERTION Left 04/06/2019   Procedure: PACEMAKER LEAD INSERTION;  Surgeon: Marinus Maw, MD;  Location: [facility] OR;  Service: Cardiovascular;  Laterality: Left;  . PACEMAKER LEAD REMOVAL N/A 04/06/2019   Procedure: PACEMAKER LEAD REMOVAL;  Surgeon: Marinus Maw, MD;  Location: [facility] OR;  Service: Cardiovascular;  Laterality: N/A;  . PERMANENT PACEMAKER INSERTION N/A 01/04/2014   Procedure: PERMANENT PACEMAKER INSERTION;  Surgeon: Marinus Maw, MD;  Location: [facility] CATH LAB;  Service: Cardiovascular;  Laterality: N/A;  . RIGHT HEART CATHETERIZATION N/A 02/12/2014   Procedure: RIGHT HEART  CATH;  Surgeon: Laurey Morale, MD;  Location: [facility] CATH LAB;  Service: Cardiovascular;  Laterality: N/A;  . TEE WITHOUT CARDIOVERSION N/A 02/12/2014   Procedure: TRANSESOPHAGEAL ECHOCARDIOGRAM (TEE);  Surgeon: Quintella Reichert, MD;  Location: [facility] ENDOSCOPY;  Service: Cardiovascular;  Laterality: N/A;  . TEE WITHOUT CARDIOVERSION N/A 04/30/2014   Procedure: TRANSESOPHAGEAL ECHOCARDIOGRAM (TEE);  Surgeon: Jake Bathe, MD;  Location: [facility] ENDOSCOPY;  Service: Cardiovascular;  Laterality: N/A;  . TRICUSPID VALVE REPLACEMENT N/A 12/30/2013   Procedure: REPAIR OF TRICUSPID VALVE LEAFLET;  Surgeon: Delight Ovens, MD;  Location: [facility] OR;  Service: Open Heart Surgery;  Laterality: N/A;  . TRICUSPID VALVE REPLACEMENT N/A 02/12/2014   Procedure: TRICUSPID VALVE REPAIR, REDO MEDIAN STERNOTOMY, REPAIR OF VENTRICULAR SEPTAL DEFECT (RECURRENT LV OUTFLOW TRACT TO RIGHT ATRIAL FISTULA), REDO CLOSURE OF PATENT FORAMEN OVALE;  Surgeon: Purcell Nails, MD;  Location: [facility] OR;  Service: Open Heart Surgery;  Laterality: N/A;  . VENOGRAM Left 04/06/2019   Procedure: Venogram;  Surgeon: Marinus Maw, MD;  Location: [facility] OR;  Service: Cardiovascular;  Laterality: Left;    Current Medications: No outpatient medications have been marked as taking for the 10/08/19 encounter (Office Visit) with Azalee Course, PA.  Allergies:   Cefepime and Vancomycin   Social History   Socioeconomic History  . Marital status: Married    Spouse name: Not on file  . Number of children: Not on file  . Years of education: Not on file  . Highest education level: Not on file  Occupational History  . Not on file  Tobacco Use  . Smoking status: Never Smoker  . Smokeless tobacco: Former Engineer, water and Sexual Activity  . Alcohol use: Yes    Alcohol/week: 0.0 standard drinks    Comment: socially  . Drug use: No  . Sexual activity: Yes    Birth control/protection: Condom  Other Topics Concern  . Not on file  Social History Narrative  .  Not on file   Social Determinants of Health   Financial Resource Strain:   . Difficulty of Paying Living Expenses: Not on file  Food Insecurity:   . Worried About Programme researcher, broadcasting/film/video in the Last Year: Not on file  . Ran Out of Food in the Last Year: Not on file  Transportation Needs:   . Lack of Transportation (Medical): Not on file  . Lack of Transportation (Non-Medical): Not on file  Physical Activity:   . Days of Exercise per Week: Not on file  . Minutes of Exercise per Session: Not on file  Stress:   . Feeling of Stress : Not on file  Social Connections:   . Frequency of Communication with Friends and Family: Not on file  . Frequency of Social Gatherings with Friends and Family: Not on file  . Attends Religious Services: Not on file  . Active Member of Clubs or Organizations: Not on file  . Attends Banker Meetings: Not on file  . Marital Status: Not on file     Family History: The patient's family history includes Heart murmur in his brother.  ROS:   Please see the history of present illness.     All other systems reviewed and are negative.  EKGs/Labs/Other Studies Reviewed:    The following studies were reviewed today:  Echo 2//22/2021 1. S/P VSD repair, cannot rule out very small shunt by colorflow doppler.  Left ventricular ejection fraction, by estimation, is 45 to 50%. The left  ventricle has mildly decreased function. The left ventricle has no  regional wall motion abnormalities.  Left ventricular diastolic parameters are indeterminate.  2. Right ventricular systolic function is normal. The right ventricular  size is normal. There is normal pulmonary artery systolic pressure.  3. The mitral valve is normal in structure and function. No evidence of  mitral valve regurgitation. No evidence of mitral stenosis.  4. S/P allograft AVR that appears to be functioning normally with trivial  AR. Marland Kitchen The aortic valve has been repaired/replaced. Aortic  valve  regurgitation is trivial. No aortic stenosis is present. Aortic valve mean  gradient measures 15.3 mmHg. Aortic  valve peak gradient measures 28.1 mmHg. Aortic valve area, by VTI measures  1.20 cm.  5. The inferior vena cava is normal in size with greater than 50%  respiratory variability, suggesting right atrial pressure of 3 mmHg.  6. S/P TV repair with trivial TR. Peak TVG . The tricuspid valve is  has been repaired/replaced.  7. S/P PFO repair with no obvious shunt by colorflow doppler but no well  visualized.  8. Aortic root/ascending aorta has been repaired/replaced and are normal  in size and structure.   EKG:  EKG is ordered today.  The ekg ordered today demonstrates normal sinus rhythm without significant ST-T wave changes.  Recent Labs: 04/06/2019: Hemoglobin 15.1; Platelets 199 10/08/2019: BUN 22; Creatinine, Ser 1.11; Potassium 4.9; Sodium 142  Recent Lipid Panel    Component Value Date/Time   TRIG 119 12/29/2013 2200    Physical Exam:    VS:  BP 118/80   Pulse 95   Ht  (1.651 m)   Wt 233 lb (105.7 kg)   BMI 38.77 kg/m     Wt Readings from Last 3 Encounters:  10/08/19 233 lb (105.7 kg)  10/06/19 250 lb 14.1 oz (113.8 kg)  04/16/19 250 lb 12.8 oz (113.8 kg)     GEN:  Well nourished, well developed in no acute distress HEENT: Normal NECK: No JVD; No carotid bruits LYMPHATICS: No lymphadenopathy CARDIAC: RRR, no murmurs, rubs, gallops RESPIRATORY:  Clear to auscultation without rales, wheezing or rhonchi  ABDOMEN: Soft, non-tender, non-distended MUSCULOSKELETAL:  No edema; No deformity  SKIN: Warm and dry NEUROLOGIC:  Alert and oriented x 3 PSYCHIATRIC:  Normal affect   ASSESSMENT:    1. Dizziness   2. Bacterial endocarditis, unspecified chronicity   3. Aortic valve disease   4. Cardiac pacemaker in situ   5. VSD (ventricular septal defect)    PLAN:    In order of problems listed above:  1. Dizziness: Dizziness initially  improved after discontinuation of beta-blocker earlier this year, however in the past 5 days, his dizziness has returned. He says this is similar to what he has experienced prior to the previous diagnosis of endocarditis. On further review, after his pacemaker implantation, when he was discharged he was supposed to switch to amoxicillin 500 mg twice a day for long-term maintenance therapy. He says he never got instruction at the has not taking any antibiotic for the past 6 months. I asked him to verify this but his infectious disease doctor to see if he is truly supposed to discontinue the antibiotic. His vital signs stable today. Although recent device interrogation showed short bursts of AT/AF, the longest episode was only 7 minutes although his dizziness tend to last for the entire day. I recommended initial lab work using CBC, basic metabolic panel, two blood cultures and repeat echocardiogram. I am quite concerned he might have recurrent endocarditis.  2. Bacterial endocarditis: Required aortic root and aortic valve replacement. Unfortunately due to recurrent endocarditis with possible perivalvular abscess, he was maintained previously on long-term anticoagulation therapy. This was supposed to be switched to amoxicillin 500 mg twice a day in February after his placement implantation. However patient never got the instruction to start on antibiotic therefore has not taken any for the past 6 months. Obtain blood culture x2 and echocardiogram. I asked him to verify this with Dr. Daiva Eves who is his infectious disease doctor.  3. History of AVR and aortic root replacement: As result of endocarditis. Repeat echocardiogram  4. History of VSD repair: Stable on last echocardiogram  5. History of pacemaker implantation: Followed by Dr. Ladona Ridgel of electrophysiology service.   Medication Adjustments/Labs and Tests Ordered: Current medicines are reviewed at length with the patient today.  Concerns regarding  medicines are outlined above.  Orders Placed This Encounter  Procedures  . Blood culture (routine single)  . Blood culture (routine single)  . Basic metabolic panel  . CBC with Differential/Platelet  . Basic metabolic panel  . EKG 12-Lead  . ECHOCARDIOGRAM COMPLETE   No orders of the defined types were placed in this  encounter.   Patient Instructions  Medication Instructions:  No Changes In Medications at this time.   *If you need a refill on your cardiac medications before your next appointment, please call your pharmacy*   Lab Work: BMET, CBC and Blood cultures today If you have labs (blood work) drawn today and your tests are completely normal, you will receive your results only by: Marland Kitchen MyChart Message (if you have MyChart) OR . A paper copy in the mail If you have any lab test that is abnormal or we need to change your treatment, we will call you to review the results.   Testing/Procedures: Your physician has requested that you have an echocardiogram. Echocardiography is a painless test that uses sound waves to create images of your heart. It provides your doctor with information about the size and shape of your heart and how well your heart's chambers and valves are working. This procedure takes approximately one hour. There are no restrictions for this procedure. This will be done tomorrow 10/09/19 at 3pm at [facility], please come through Entrance C.     Follow-Up: At [facility], you and your health needs are our priority.  As part of our continuing mission to provide you with exceptional heart care, we have created designated Provider Care Teams.  These Care Teams include your primary Cardiologist (physician) and Advanced Practice Providers (APPs -  Physician Assistants and Nurse Practitioners) who all work together to provide you with the care you need, when you need it.  We recommend signing up for the patient portal called "MyChart".  Sign up information is  provided on this After Visit Summary.  MyChart is used to connect with patients for Virtual Visits (Telemedicine).  Patients are able to view lab/test results, encounter notes, upcoming appointments, etc.  Non-urgent messages can be sent to your provider as well.   To learn more about what you can do with MyChart, go to [email or web address].    Your next appointment:   2 week(s)  The format for your next appointment:   In Person  Provider:   Azalee Course, PA-C   Other Instructions     Signed, Azalee Course, PA  10/09/2019 4:45 PM    [facility]

## 2019-10-08 NOTE — Progress Notes (Signed)
Remote pacemaker transmission.   

## 2019-10-08 NOTE — Telephone Encounter (Signed)
Pt sent a few transmission because he been feeling weird. The pt states he been lightheaded, numb, HR all over the place. He states he it is hard for him to focus. I told him the nurse will give him a call back.

## 2019-10-08 NOTE — Telephone Encounter (Signed)
Transmission not received. Called patient to see if he can resend.   Patients transmission received. Presenting rhythm AS/VP 93 bpm. No new episodes noted.   Patient has follow-up with cardiology today at 3:15. Patient encouraged strongly to go to that apt. Patient verbalizes understanding.

## 2019-10-09 ENCOUNTER — Other Ambulatory Visit: Payer: Self-pay

## 2019-10-09 ENCOUNTER — Other Ambulatory Visit: Payer: Self-pay | Admitting: Physician Assistant

## 2019-10-09 ENCOUNTER — Encounter: Payer: Self-pay | Admitting: Physician Assistant

## 2019-10-09 ENCOUNTER — Telehealth: Payer: Self-pay

## 2019-10-09 ENCOUNTER — Ambulatory Visit (HOSPITAL_COMMUNITY)
Admission: RE | Admit: 2019-10-09 | Discharge: 2019-10-09 | Disposition: A | Discharge status: Home / Self Care | Payer: 59 | Source: Ambulatory Visit | Attending: Physician Assistant | Admitting: Physician Assistant

## 2019-10-09 DIAGNOSIS — I359 Nonrheumatic aortic valve disorder, unspecified: Secondary | ICD-10-CM

## 2019-10-09 DIAGNOSIS — I330 Acute and subacute infective endocarditis: Secondary | ICD-10-CM | POA: Insufficient documentation

## 2019-10-09 DIAGNOSIS — I429 Cardiomyopathy, unspecified: Secondary | ICD-10-CM | POA: Diagnosis not present

## 2019-10-09 DIAGNOSIS — Z8774 Personal history of (corrected) congenital malformations of heart and circulatory system: Secondary | ICD-10-CM | POA: Insufficient documentation

## 2019-10-09 DIAGNOSIS — Z950 Presence of cardiac pacemaker: Secondary | ICD-10-CM | POA: Diagnosis not present

## 2019-10-09 LAB — ECHOCARDIOGRAM COMPLETE
AR max vel: 1.58 cm2
AV Area VTI: 1.54 cm2
AV Area mean vel: 1.53 cm2
AV Mean grad: 18 mmHg
AV Peak grad: 34 mmHg
Ao pk vel: 2.92 m/s
Area-P 1/2: 3.74 cm2
Calc EF: 43.7 %
S' Lateral: 3.9 cm
Single Plane A2C EF: 45 %
Single Plane A4C EF: 46 %

## 2019-10-09 MED ORDER — AMOXICILLIN 500 MG PO CAPS: 500.0000 mg | ORAL_CAPSULE | Freq: Two times a day (BID) | ORAL | 6 refills | Status: DC

## 2019-10-09 NOTE — Progress Notes (Signed)
  Echocardiogram 2D Echocardiogram has been performed.  Burnard Hawthorne 10/09/2019, 3:54 PM

## 2019-10-09 NOTE — Telephone Encounter (Addendum)
Spoke with the pt and advised him that Albert Course PA has sent in his RX for antibiotics and his CBC was not drawn and he agreed to stop back by to have his CBC drawn this afternoon or Monday 10/12/19.   Pt to also have repeat blood culture if he comes today prior to starting antibiotics.

## 2019-10-09 NOTE — Progress Notes (Signed)
Discussed with Mr. [name] over the phone, will follow up on the blood culture. He came in today to redraw the CBC and a second set of blood culture

## 2019-10-09 NOTE — Progress Notes (Signed)
I have called and personally discussed with [name] over the phone

## 2019-10-09 NOTE — Telephone Encounter (Signed)
He should just start the amoxicillin that [place] rx

## 2019-10-10 LAB — CBC WITH DIFFERENTIAL/PLATELET
Basophils Absolute: 0.1 10*3/uL (ref 0.0–0.2)
Basos: 1 %
EOS (ABSOLUTE): 0.1 10*3/uL (ref 0.0–0.4)
Eos: 1 %
Hematocrit: 47.4 % (ref 37.5–51.0)
Hemoglobin: 16.6 g/dL (ref 13.0–17.7)
Immature Grans (Abs): 0 10*3/uL (ref 0.0–0.1)
Immature Granulocytes: 0 %
Lymphocytes Absolute: 1.8 10*3/uL (ref 0.7–3.1)
Lymphs: 20 %
MCH: 30.6 pg (ref 26.6–33.0)
MCHC: 35 g/dL (ref 31.5–35.7)
MCV: 87 fL (ref 79–97)
Monocytes Absolute: 0.8 10*3/uL (ref 0.1–0.9)
Monocytes: 8 %
Neutrophils Absolute: 6.3 10*3/uL (ref 1.4–7.0)
Neutrophils: 70 %
Platelets: 203 10*3/uL (ref 150–450)
RBC: 5.43 x10E6/uL (ref 4.14–5.80)
RDW: 14 % (ref 11.6–15.4)
WBC: 9 10*3/uL (ref 3.4–10.8)

## 2019-10-12 ENCOUNTER — Encounter: Payer: Self-pay | Admitting: Physician Assistant

## 2019-10-12 ENCOUNTER — Ambulatory Visit: Payer: 59 | Admitting: General Practice

## 2019-10-12 NOTE — Telephone Encounter (Signed)
Patient returned call and informed for MD recommendation. Patient verbalized understanding.   [name]

## 2019-10-12 NOTE — Telephone Encounter (Signed)
Patient calling back with questions.

## 2019-10-12 NOTE — Telephone Encounter (Signed)
error 

## 2019-10-12 NOTE — Telephone Encounter (Signed)
Called patient, he states that he spoke with Azalee Course, PA and he had some other questions and updating his symptoms he would like him to know and is requesting he call him back to discuss.   Patient states he now has been having neck pain (stiff neck), headaches, and still having blurred vision, and lightheaded.  He states he had meningitis a while back and he has not seen anyone since that happened to him about 6 years ago. He was questioning doing other testing, and even possibly seeing neuro since his blurred vision is an issue.   Overall, patient had a few questions I was unable to answer or assist with, he was also questioning his blood work results. Will route to PA to give recommendations.

## 2019-10-12 NOTE — Telephone Encounter (Signed)
Attempted to update patient with MD's response. Patient not able to take call at this time. Voicemail is full and not accepting new messages. Will call again later today. Lorenso Courier, [place]

## 2019-10-14 LAB — BASIC METABOLIC PANEL
BUN/Creatinine Ratio: 20 (ref 9–20)
BUN: 22 mg/dL — ABNORMAL HIGH (ref 6–20)
CO2: 22 mmol/L (ref 20–29)
Calcium: 9.9 mg/dL (ref 8.7–10.2)
Chloride: 103 mmol/L (ref 96–106)
Creatinine, Ser: 1.11 mg/dL (ref 0.76–1.27)
GFR calc Af Amer: 102 mL/min/{1.73_m2} (ref >59)
GFR calc non Af Amer: 88 mL/min/{1.73_m2} (ref >59)
Glucose: 120 mg/dL — ABNORMAL HIGH (ref 65–99)
Potassium: 4.9 mmol/L (ref 3.5–5.2)
Sodium: 142 mmol/L (ref 134–144)

## 2019-10-14 LAB — CULTURE, BLOOD (SINGLE)

## 2019-10-14 NOTE — Progress Notes (Signed)
1 of 2 blood culture has finalized after 5 days with no bacterial growth, second blood culture is pending, likely will finalize tomorrow. I have called [name] and gave him the report

## 2019-10-14 NOTE — Telephone Encounter (Signed)
I spoke with [name] on the phone, white blood cell count was normal, no fever to suggest meningitis. So far 1 of 2 blood culture came back negative, the other blood culture likely will finalize tomorrow. I recommend for the patient to be evaluated by PCP as well. Likelihood of meningitis is fairly low.

## 2019-10-14 NOTE — Telephone Encounter (Signed)
Noted. Thanks.

## 2019-10-15 ENCOUNTER — Other Ambulatory Visit: Payer: Self-pay

## 2019-10-15 ENCOUNTER — Telehealth (INDEPENDENT_AMBULATORY_CARE_PROVIDER_SITE_OTHER): Payer: 59 | Admitting: Infectious Disease

## 2019-10-15 ENCOUNTER — Telehealth: Payer: Self-pay

## 2019-10-15 ENCOUNTER — Other Ambulatory Visit: Payer: Self-pay | Admitting: Infectious Disease

## 2019-10-15 ENCOUNTER — Encounter: Payer: Self-pay | Admitting: Infectious Disease

## 2019-10-15 DIAGNOSIS — R6520 Severe sepsis without septic shock: Secondary | ICD-10-CM

## 2019-10-15 DIAGNOSIS — I330 Acute and subacute infective endocarditis: Secondary | ICD-10-CM

## 2019-10-15 DIAGNOSIS — A401 Sepsis due to streptococcus, group B: Secondary | ICD-10-CM | POA: Diagnosis not present

## 2019-10-15 DIAGNOSIS — Z8774 Personal history of (corrected) congenital malformations of heart and circulatory system: Secondary | ICD-10-CM

## 2019-10-15 DIAGNOSIS — D702 Other drug-induced agranulocytosis: Secondary | ICD-10-CM

## 2019-10-15 DIAGNOSIS — T826XXD Infection and inflammatory reaction due to cardiac valve prosthesis, subsequent encounter: Secondary | ICD-10-CM

## 2019-10-15 DIAGNOSIS — I358 Other nonrheumatic aortic valve disorders: Secondary | ICD-10-CM

## 2019-10-15 DIAGNOSIS — G43409 Hemiplegic migraine, not intractable, without status migrainosus: Secondary | ICD-10-CM

## 2019-10-15 DIAGNOSIS — I38 Endocarditis, valve unspecified: Secondary | ICD-10-CM

## 2019-10-15 DIAGNOSIS — G43909 Migraine, unspecified, not intractable, without status migrainosus: Secondary | ICD-10-CM | POA: Insufficient documentation

## 2019-10-15 DIAGNOSIS — A491 Streptococcal infection, unspecified site: Secondary | ICD-10-CM

## 2019-10-15 DIAGNOSIS — Z950 Presence of cardiac pacemaker: Secondary | ICD-10-CM

## 2019-10-15 DIAGNOSIS — Z954 Presence of other heart-valve replacement: Secondary | ICD-10-CM

## 2019-10-15 HISTORY — DX: Migraine, unspecified, not intractable, without status migrainosus: G43.909

## 2019-10-15 LAB — CULTURE, BLOOD (SINGLE)

## 2019-10-15 MED ORDER — AMOXICILLIN 500 MG PO CAPS
500.0000 mg | ORAL_CAPSULE | Freq: Three times a day (TID) | ORAL | 6 refills | Status: DC
Start: 2019-10-15 — End: 2020-01-01

## 2019-10-15 NOTE — Progress Notes (Signed)
Virtual Visit via Telephone Note  I connected with [name] on 10/15/19 at 11:00 AM EDT by telephone and verified that I am speaking with the correct person using two identifiers.  Location: Patient: Home Provider: My home   I discussed the limitations, risks, security and privacy concerns of performing an evaluation and management service by telephone and the availability of in person appointments. I also discussed with the patient that there may be a patient responsible charge related to this service. The patient expressed understanding and agreed to proceed.   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 Valve sent to [facility] showed GBS by ribosomal 16S sequencing.   He has had trouble tolerating the high dose ceftriaxone and also completed 2 weeks of gentamicin. He was on Rocephin when I saw him on 02/02/14 and getting used to the abx but a few days later was found to have worsening dyspnea, heart failure change in heart sounds and was admitted and found  To have new Ventricular Septal Defect (recurrent LV outflow tract to right atrial fistula, Severe Tricuspid Regurgitatio, Recurrent Patent Foramen Ovale, and Acute Congestive Heart Failure.   He underwent :   Redo Median Sternotomy  Repair of Ventricular Septal Defect Double layer bovine pericardial patch closure  Tricuspid Valve Repair Complex Valvuloplasty including plication of septal leaflet Edwards mc3 Ring annuloplasty  On January 1st, 2016. Tissue was sent off to [facility] where PCR of Ribosome AGAIN WAS POSITIVE FOR GROUP B STREPTOCOCCUS.       NOTE Pacemaker from first CT surgery had remained in place through 2nd CT surgery and he is Pacer dependent.   IN the interim he was treated withn Rocephin high dose along with 2 weeks of gentamicin which he  finished).  His repeat Echo showed stability of his postoperative repairs.  He was then on oral amoxicillin and without any symptoms. His fiance was resting her head on his chest and discovered a new murmur.  He was evaluated and had 2d echo showing  mobile vegetation measuring 1.3 x 1.7 cm seemingly attached to the bioprosthetic aortic valve,with mild to moderate AI on 04/29/14.  He was brought into the hospital and underwent TEE which read as showing:  The same large (1.5 x 1.1cm) mobile irregular bordered echodensity along the anterior aspect of the subvalvular apparatus of the bioprosthetic aortic valve in the left ventricular outflow tract consistent with vegetation. There is perivalvular abscess like pocket/space in the posterior region of aortic valve adjacent to intraatrial septum. There was mild regurgitation.  He had blood cultures taken on amoxicillin which were negative and he was broadened ultimately to vancomycin and cefepime.   He was followed closely by Cardiology and CT surgery.   Dr. Tyrone Sage reviewed the TEE and noted that the  Lesion was in the outflow tract of the left ventricle and not on the aortic homograph leaflets. He did not recommend repeat 3rd open heart surgery.  Patient had  been dc to home on IV vancomycin and cefepime.   We found that he then developed leukopenia, vancomycin was stopped, but it did not quickly resolve.  So we stopped cefepime as well. He was out of town so we  bridged him with high dose oral levaquin 750mg  daily until he could get back to [place] and he was switched to IV daptomycin which he has been on through his 7 week of his most recent round of abx therapy. TTE done in mid April  2106 shows that his  vegetation has shrunk in size.  We then switched him to high dose oral levaquin 750mg  daily  Since then repeat TTE done in July 2016 and read by Dr. Jens Som and Dr. Tyrone Sage and found to no longer have evidence of vegetation.  Another TTE done in November that did not show any evidence for vegeation  He has been on levaquin. I had not seen him since 2017 before seeing him again in April of 2019.  He had remained on levaquin since then.  I was concerned throughout this time period that PM could be a nidus of infection and have been reluctant to take him off of antibiotics though I had wished to change him to amoxicillin from levaquin to see how he would do on this with blood work.  He called in for refill of levaquin and we scheduled an e visit in June of 2020.   Since I last saw him several things have happened.  He had gained weight and been instructed to lose weight and begun exercising including lifting weights.  He had also stopped his levaquin in January feeling that he might no longer need it (as we had previously discussed)  In the interim he was admitted with CHB with asystolic moments in February of 2021 which turned out to have been due to fractured leads from his pacemaker. He had blood cultures done on that admission which were without growth. Intraoperative echo without any vegetations identified. No evidence of infection per Dr. Lubertha Basque note with exploration of generator pocket; dense fibrous scar tissue around leads. Fractured RV lead was removed   Rexene Alberts was called by Cardiology and recommended patient start amoxicillin 500 mg q 12.   He saw Dr Lisabeth Devoid from Cardiology who obtained blood cultures again which have no growth so far  Repeat TTE in late August of 2021 appears stable.  Patient started amoxicillin after this.  Of note he tells me that prior to his diagnosis of endocarditis with septic emboli to the brain and meningitis he suffered from frequent  migrainous symptoms in which one side of his face would go numb he might have blurry vision and also headache.  After his infection was diagnosed and treated surgically and medically this migraines disappeared.  However within the past 2 months or so he is again having symptoms of blurry vision, dizziness, occasional headaches, neck pain, malaise.  Symptoms may be better after starting his amoxicillin.  I think he clearly needs to be seen by Neurology and evaluated for LP--unless they are confident this is all migrainous though the timing of this reappearing AFTER he stopped chronic antibiotics is troublesome.  Past Medical History:  Diagnosis Date  . AKI (acute kidney injury) (HCC)   . Aortic valve vegetation on echo 04/29/14 04/29/2014   managed medically  . Brain abscess   . Congestive dilated cardiomyopathy (HCC) 06/23/2015  . Drug-induced leukopenia (HCC) 06/15/2014  . Endocarditis of aortic valve-November 2015   . Fracture of left femur (HCC)   . History of open heart surgery    December 30, 2013- ascending aortic root replacement, TEE  . Meningitis    Group B Strep (November 2015)  . Migraine headache 10/15/2019  .  Obesity   . Patent foramen ovale    Closed during procedure on December 30, 2013  . Presence of permanent cardiac pacemaker 01/04/2014   PPM MEDTRONIC FOR COMPLETE HEART BLOCK  . 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 S/P aortic root and valve allograft 12/30/2013   Human allograft aortic root replacement with repair of aorta-right atrial fistula and tricuspid valve repair  . S/P placement of cardiac pacemaker 01/04/2014   Medtronic (serial number [id number]) dual chamber permanent pacemaker implanted by Dr Ladona Ridgel  . S/P redo patent foramen ovale closure 02/12/2014  . S/P redo tricuspid valve repair 02/12/2014   Complex valvuloplasty including patch closure of VSD with plication of septal leaflet and 26 mm Edwards mc3 ring annuloplasty  . S/P ventricular septal  defect repair + redo tricuspid valve repair + redo closure PFO 02/12/2014   Redo sternotomy for bovine pericardial patch repair of large ventricular septal defect (recurrent LVOT to RA fistula due to bacterial endocarditis)  . Smokeless tobacco use   . Third degree heart block (HCC) 12/31/2013  . Thrombocytopenia (HCC) 12/2013  . Tricuspid regurgitation 02/11/2014  . Ventricular septal defect 02/12/2014   Post-infectious s/p repair of LVOT to RA fistula for bacterial endocarditis    Past Surgical History:  Procedure Laterality Date  . ASCENDING AORTIC ROOT REPLACEMENT N/A 12/30/2013   Procedure: ASCENDING AORTIC ROOT REPLACEMENT with 107mm HOMOGRAFT, CLOSURE OF AORTIC TO RIGHT ATRIAL FISTULA, CLOSURE OF PATENT FORAMEN OVALE;  Surgeon: Delight Ovens, MD;  Location: [facility] OR;  Service: Open Heart Surgery;  Laterality: N/A;  . INSERT / REPLACE / REMOVE PACEMAKER  01/04/2014  . INTRAOPERATIVE TRANSESOPHAGEAL ECHOCARDIOGRAM N/A 12/30/2013   Procedure: INTRAOPERATIVE TRANSESOPHAGEAL ECHOCARDIOGRAM;  Surgeon: Delight Ovens, MD;  Location: [facility] OR;  Service: Open Heart Surgery;  Laterality: N/A;  . PACEMAKER INSERTION Left 04/06/2019   Procedure: PACEMAKER LEAD INSERTION;  Surgeon: Marinus Maw, MD;  Location: [facility] OR;  Service: Cardiovascular;  Laterality: Left;  . PACEMAKER LEAD REMOVAL N/A 04/06/2019   Procedure: PACEMAKER LEAD REMOVAL;  Surgeon: Marinus Maw, MD;  Location: [facility] OR;  Service: Cardiovascular;  Laterality: N/A;  . PERMANENT PACEMAKER INSERTION N/A 01/04/2014   Procedure: PERMANENT PACEMAKER INSERTION;  Surgeon: Marinus Maw, MD;  Location: [facility] CATH LAB;  Service: Cardiovascular;  Laterality: N/A;  . RIGHT HEART CATHETERIZATION N/A 02/12/2014   Procedure: RIGHT HEART CATH;  Surgeon: Laurey Morale, MD;  Location: [facility] CATH LAB;  Service: Cardiovascular;  Laterality: N/A;  . TEE WITHOUT CARDIOVERSION N/A 02/12/2014   Procedure: TRANSESOPHAGEAL ECHOCARDIOGRAM (TEE);  Surgeon: Quintella Reichert, MD;  Location: [facility] ENDOSCOPY;  Service: Cardiovascular;  Laterality: N/A;  . TEE WITHOUT CARDIOVERSION N/A 04/30/2014   Procedure: TRANSESOPHAGEAL ECHOCARDIOGRAM (TEE);  Surgeon: Jake Bathe, MD;  Location: [facility] ENDOSCOPY;  Service: Cardiovascular;  Laterality: N/A;  . TRICUSPID VALVE REPLACEMENT N/A 12/30/2013   Procedure: REPAIR OF TRICUSPID VALVE LEAFLET;  Surgeon: Delight Ovens, MD;  Location: [facility] OR;  Service: Open Heart Surgery;  Laterality: N/A;  . TRICUSPID VALVE REPLACEMENT N/A 02/12/2014   Procedure: TRICUSPID VALVE REPAIR, REDO MEDIAN STERNOTOMY, REPAIR OF VENTRICULAR SEPTAL DEFECT (RECURRENT LV OUTFLOW TRACT TO RIGHT ATRIAL FISTULA), REDO CLOSURE OF PATENT FORAMEN OVALE;  Surgeon: Purcell Nails, MD;  Location: [facility] OR;  Service: Open Heart Surgery;  Laterality: N/A;  . VENOGRAM Left 04/06/2019   Procedure: Venogram;  Surgeon: Marinus Maw, MD;  Location[facility] OR;  Service: Cardiovascular;  Laterality: Left;    Family  History  Problem Relation Age of Onset  . Heart murmur Brother       Social History   Socioeconomic History  . Marital status: Married    Spouse name: Not on file  . Number of children: Not on file  . Years of education: Not on file  . Highest education level: Not on file  Occupational History  . Not on file  Tobacco Use  . Smoking status: Never Smoker  . Smokeless tobacco: Former Engineer, water and Sexual Activity  . Alcohol use: Yes    Alcohol/week: 0.0 standard drinks    Comment: socially  . Drug use: No  . Sexual activity: Yes    Birth control/protection: Condom  Other Topics Concern  . Not on file  Social History Narrative  . Not on file   Social Determinants of Health   Financial Resource Strain:   . Difficulty of Paying Living Expenses: Not on file  Food Insecurity:   . Worried About Programme researcher, broadcasting/film/video in the Last Year: Not on file  . Ran Out of Food in the Last Year: Not on file  Transportation Needs:   . Lack of  Transportation (Medical): Not on file  . Lack of Transportation (Non-Medical): Not on file  Physical Activity:   . Days of Exercise per Week: Not on file  . Minutes of Exercise per Session: Not on file  Stress:   . Feeling of Stress : Not on file  Social Connections:   . Frequency of Communication with Friends and Family: Not on file  . Frequency of Social Gatherings with Friends and Family: Not on file  . Attends Religious Services: Not on file  . Active Member of Clubs or Organizations: Not on file  . Attends Banker Meetings: Not on file  . Marital Status: Not on file    Allergies  Allergen Reactions  . Cefepime Other (See Comments)    Leukopenia not clear if due to vancomycin vs cefepime. WBC dropped down  . Vancomycin Other (See Comments)    ? Drug induced leukopenia vs being due to cefepime. WBC dropped down     Current Outpatient Medications:  .  acetaminophen (TYLENOL) 650 MG CR tablet, Take 650 mg by mouth every 8 (eight) hours as needed for pain. (Patient not taking: Reported on 10/08/2019), Disp: , Rfl:  .  amoxicillin (AMOXIL) 500 MG capsule, Take 1 capsule (500 mg total) by mouth 3 (three) times daily., Disp: 90 capsule, Rfl: 6 .  aspirin EC 81 MG tablet, Take 81 mg by mouth daily. (Patient not taking: Reported on 10/08/2019), Disp: , Rfl:  .  predniSONE (DELTASONE) 10 MG tablet, Take by mouth., Disp: , Rfl:   ROS as above otherwise 12 pt ROS is negative    Observations/Objective:  He seems relatively well over the phone though I am bothered by return of his migrainous symptoms that preceded his dx of meningitis and came on after stopping antibiotics.  Assessment and Plan:  Group B streptococcal  Aortic valve endocarditis and Aorto-Right Atrial fistula sp AVR, TV repair, repair of Aorto to Right atrial fistula, placement of PM due to 3rd degree heart block, then Ventricular Septal Defect (recurrent LV outflow tract to right atrial fistula, Severe  Tricuspid Regurgitatio, Recurrent Patent Foramen Ovale, and Acute Congestive Heart Failure. Sp Redo Median Sternotom, Repair of Ventricular Septal Defect, Double layer bovine pericardial patch closure, Tricuspid Valve Repair, Complex Valvuloplasty including plication of septal leaflet, [employer]  annuloplasty with Group B Streptococcus isolated from heart valve sp repeat IV rocephin x 6 weeks with 2 weeks of gentamicin, changed to oral amoxicillin but now yet again with evidence of large vegetation + possible abscess vs cavity. Blood cultures are NG on amoxicillin and he was broadened IV vancomycin cefepime with course complicated by leukopenia that resolved when vancomycin and then cefepime stopped, bridged with levaquin then to daptomycin for 7 weeks of therapy follwed by high dose levaquin with recent TTE not showing vegetation anymore in when last looked at.  He has now come off antibiotics but without my knowledge.  He had fractured PM wires and new PM had to be placed.  If he had any nidus of infection at old PM site it would have been more ideal if he had STILL been on antibiotics when new one was placed  For now given the Neurological symptoms I will have him continue the amoxicillin but increase dose to 500mg  TID  I have referred him to Neurology and will get MRI vs CT of the brain  If LP is pursued it would be prudent to stop antibiotics 2-4 weeks prior to LP  Hx of Drug induced leukopenia: he should have repeat CBC in next 3 weeks   COVID prevention: pt had COVID 19 infection and now will get vaccine as well (he was waiting for full FDA approval)  Follow Up Instructions:    I discussed the assessment and treatment plan with the patient. The patient was provided an opportunity to ask questions and all were answered. The patient agreed with the plan and demonstrated an understanding of the instructions.   The patient was advised to call back or seek an in-person evaluation if  the symptoms worsen or if the condition fails to improve as anticipated.  I provided 24 minutes of non-face-to-face time during this encounter.   , MD

## 2019-10-15 NOTE — Telephone Encounter (Signed)
Patient returned call to front dest and scheduled for repeat labs in 3 weeks (CBC only) per Dr. Daiva Eves. Orders placed in epic. Valarie Cones

## 2019-10-15 NOTE — Progress Notes (Signed)
Attempted to call patient, no answer, left voicemail to return call.

## 2019-10-15 NOTE — Progress Notes (Signed)
Patient returned call and scheduled for labs in 3 weeks.

## 2019-10-15 NOTE — Telephone Encounter (Signed)
Per triage routed noted from Dr. Daiva Eves patient needs repeat CBC since patient restarted amoxicillin. Attempted to call patient for appointment, left voicemail to return call to [facility].  Valarie Cones

## 2019-10-21 ENCOUNTER — Other Ambulatory Visit: Payer: Self-pay | Admitting: Infectious Disease

## 2019-10-21 DIAGNOSIS — B582 Toxoplasma meningoencephalitis: Secondary | ICD-10-CM

## 2019-10-21 DIAGNOSIS — G039 Meningitis, unspecified: Secondary | ICD-10-CM

## 2019-10-21 NOTE — Progress Notes (Signed)
CT being ordered since he has mechanical valve

## 2019-10-21 NOTE — Progress Notes (Signed)
I would like to get MRI of [name]'s brain give his story of meningitis and his recent headaches.  Dr. Sharrell Ku from electrophysiologist confirm that the MRI is pacemaker compatible

## 2019-10-22 ENCOUNTER — Other Ambulatory Visit: Payer: Self-pay

## 2019-10-22 ENCOUNTER — Ambulatory Visit: Payer: 59 | Admitting: Physician Assistant

## 2019-10-22 ENCOUNTER — Encounter: Payer: Self-pay | Admitting: Physician Assistant

## 2019-10-22 VITALS — BP 118/76 | HR 80 | Ht 65.0 in | Wt 236.0 lb

## 2019-10-22 DIAGNOSIS — Z952 Presence of prosthetic heart valve: Secondary | ICD-10-CM

## 2019-10-22 DIAGNOSIS — Z8679 Personal history of other diseases of the circulatory system: Secondary | ICD-10-CM

## 2019-10-22 DIAGNOSIS — R42 Dizziness and giddiness: Secondary | ICD-10-CM | POA: Diagnosis not present

## 2019-10-22 DIAGNOSIS — I428 Other cardiomyopathies: Secondary | ICD-10-CM | POA: Diagnosis not present

## 2019-10-22 DIAGNOSIS — Z950 Presence of cardiac pacemaker: Secondary | ICD-10-CM

## 2019-10-22 NOTE — Patient Instructions (Signed)
Medication Instructions:  Your physician recommends that you continue on your current medications as directed. Please refer to the Current Medication list given to you today.  *If you need a refill on your cardiac medications before your next appointment, please call your pharmacy*  Lab Work: NONE ordered at this time of appointment   If you have labs (blood work) drawn today and your tests are completely normal, you will receive your results only by: MyChart Message (if you have MyChart) OR A paper copy in the mail If you have any lab test that is abnormal or we need to change your treatment, we will call you to review the results.  Testing/Procedures: NONE ordered at this time of appointment   Follow-Up: At [facility], you and your health needs are our priority.  As part of our continuing mission to provide you with exceptional heart care, we have created designated Provider Care Teams.  These Care Teams include your primary Cardiologist (physician) and Advanced Practice Providers (APPs -  Physician Assistants and Nurse Practitioners) who all work together to provide you with the care you need, when you need it.  We recommend signing up for the patient portal called "MyChart".  Sign up information is provided on this After Visit Summary.  MyChart is used to connect with patients for Virtual Visits (Telemedicine).  Patients are able to view lab/test results, encounter notes, upcoming appointments, etc.  Non-urgent messages can be sent to your provider as well.   To learn more about what you can do with MyChart, go to [email or web address].    Your next appointment:   3-4 month(s)  The format for your next appointment:   In Person  Provider:   Brian Crenshaw, MD  Other Instructions    

## 2019-10-22 NOTE — Progress Notes (Signed)
Cardiology Office Note:    Date:  10/24/2019   ID:  [name], DOB 05/02/1985, MRN [id number]  PCP:  [name], MD  [facility] Cardiologist:  Olga Millers, MD  [facility] Electrophysiologist:  [name], MD   Referring MD: [name], MD   Chief Complaint  Patient presents with  . Follow-up    seen for Dr. Jens Som    History of Present Illness:    [name] is a 34 y.o. male with a hx of AVR, CHB s/p PPM, thrombocytopenia.  He had bacterial endocarditis in November 2015 and subsequently underwent aortic root and aortic valve replacement.  TEE performed in January 2016 revealed a large VSD with left-to-right shunting from LVOT.  He underwent redo median sternotomy and repair of VSD, tricuspid valve repair and redo closure of PFO in January 2016.  He had a repeat echocardiogram in March 2016 that showed mild LV dysfunction, mobile mass in the aortic valve concerning for vegetation as well as mild to moderate aortic insufficiency.  He was treated with another course of IV antibiotic.  Blood culture remained negative.  TEE showed low normal LV function and aortic valve mass.  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  He was evaluated by Dr. Tyrone Sage and medical therapy was recommended.  Repeat echocardiogram in August 2019 so showed EF 40 to 45%, bioprosthetic aortic valve with normal function, mean gradient 22 mmHg, mild AI, mild to moderate RV dysfunction.  He presented to the ED in February 2021 was dizziness and dyspnea and found to have RV lead failure.  He eventually underwent lead extraction on 04/06/2019 by Dr. Ladona Ridgel, a new Medtronic RV lead was placed and a Medtronic dual-chamber pacemaker was used to replace the previous pacemaker. Due to persistent dizziness, metoprolol was discontinued.  Recent device interrogation showed normal device function, 3 AT/AF longest was 7 minutes.  He presented to the ED on the night of 8/24 with  rapid heartbeat and dizziness.  His symptom of dizziness seems to be much longer than the duration of AT/AF, therefore dizziness was likely due to something else.  He said he had a similar symptom in the past due to endocarditis.  I reviewed his previous record, it seems he was supposed to be discharged amoxicillin after device replacement in February, however he was not informed about amoxicillin and that has not been on antibiotic.  Prior to that point, he was on antibiotic for several years.  His primary concern is that his dizziness may be related to bacteremia and endocarditis.  We did basic metabolic panel and a CBC which all came back to be normal.  White blood cell count within normal range at about 9.0.  Two separate blood culture also came back negative as well.  We obtained a urgent echocardiogram which also came back reassuring as well.  Echocardiogram obtained on 10/09/2019 showed EF 40 to 45%, no wall motion abnormality, mild AI, mild to moderate aortic stenosis, however no obvious vegetation.  I did ask him to start on the amoxicillin however asked him to follow-up with his infectious disease doctor Dr. Algis Liming for final input on the duration of the antibiotic.  He was seen by Dr. Algis Liming virtually who was also quite concerned about his dizzy spell.  He was referred to neurology service to rule out migraine, and also potentially consider lumbar puncture.  So far there is no clinical evidence of endocarditis, therefore I did not order any TEE.  Unless there are definitive evidence that he has recurrent bacterial endocarditis, I do not recommend any further work-up from cardiology perspective.   Past Medical History:  Diagnosis Date  . AKI (acute kidney injury) (HCC)   . Aortic valve vegetation on echo 04/29/14 04/29/2014   managed medically  . Brain abscess   . Congestive dilated cardiomyopathy (HCC) 06/23/2015  . Drug-induced leukopenia (HCC) 06/15/2014  . Endocarditis of aortic valve-November  2015   . Fracture of left femur (HCC)   . History of open heart surgery    December 30, 2013- ascending aortic root replacement, TEE  . Meningitis    Group B Strep (November 2015)  . Migraine headache 10/15/2019  . Obesity   . Patent foramen ovale    Closed during procedure on December 30, 2013  . Presence of permanent cardiac pacemaker 01/04/2014   PPM MEDTRONIC FOR COMPLETE HEART BLOCK  . 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 S/P aortic root and valve allograft 12/30/2013   Human allograft aortic root replacement with repair of aorta-right atrial fistula and tricuspid valve repair  . S/P placement of cardiac pacemaker 01/04/2014   Medtronic (serial number [id number]) dual chamber permanent pacemaker implanted by Dr Ladona Ridgel  . S/P redo patent foramen ovale closure 02/12/2014  . S/P redo tricuspid valve repair 02/12/2014   Complex valvuloplasty including patch closure of VSD with plication of septal leaflet and 26 mm Edwards mc3 ring annuloplasty  . S/P ventricular septal defect repair + redo tricuspid valve repair + redo closure PFO 02/12/2014   Redo sternotomy for bovine pericardial patch repair of large ventricular septal defect (recurrent LVOT to RA fistula due to bacterial endocarditis)  . Smokeless tobacco use   . Third degree heart block (HCC) 12/31/2013  . Thrombocytopenia (HCC) 12/2013  . Tricuspid regurgitation 02/11/2014  . Ventricular septal defect 02/12/2014   Post-infectious s/p repair of LVOT to RA fistula for bacterial endocarditis    Past Surgical History:  Procedure Laterality Date  . ASCENDING AORTIC ROOT REPLACEMENT N/A 12/30/2013   Procedure: ASCENDING AORTIC ROOT REPLACEMENT with 43mm HOMOGRAFT, CLOSURE OF AORTIC TO RIGHT ATRIAL FISTULA, CLOSURE OF PATENT FORAMEN OVALE;  Surgeon: Delight Ovens, MD;  Location: [facility] OR;  Service: Open Heart Surgery;  Laterality: N/A;  . INSERT / REPLACE / REMOVE PACEMAKER  01/04/2014  . INTRAOPERATIVE TRANSESOPHAGEAL  ECHOCARDIOGRAM N/A 12/30/2013   Procedure: INTRAOPERATIVE TRANSESOPHAGEAL ECHOCARDIOGRAM;  Surgeon: Delight Ovens, MD;  Location: [facility] OR;  Service: Open Heart Surgery;  Laterality: N/A;  . PACEMAKER INSERTION Left 04/06/2019   Procedure: PACEMAKER LEAD INSERTION;  Surgeon: Marinus Maw, MD;  Location: [facility] OR;  Service: Cardiovascular;  Laterality: Left;  . PACEMAKER LEAD REMOVAL N/A 04/06/2019   Procedure: PACEMAKER LEAD REMOVAL;  Surgeon: Marinus Maw, MD;  Location: [facility] OR;  Service: Cardiovascular;  Laterality: N/A;  . PERMANENT PACEMAKER INSERTION N/A 01/04/2014   Procedure: PERMANENT PACEMAKER INSERTION;  Surgeon: Marinus Maw, MD;  Location: [facility] CATH LAB;  Service: Cardiovascular;  Laterality: N/A;  . RIGHT HEART CATHETERIZATION N/A 02/12/2014   Procedure: RIGHT HEART CATH;  Surgeon: Laurey Morale, MD;  Location: [facility] CATH LAB;  Service: Cardiovascular;  Laterality: N/A;  . TEE WITHOUT CARDIOVERSION N/A 02/12/2014   Procedure: TRANSESOPHAGEAL ECHOCARDIOGRAM (TEE);  Surgeon: Quintella Reichert, MD;  Location: [facility] ENDOSCOPY;  Service: Cardiovascular;  Laterality: N/A;  . TEE WITHOUT CARDIOVERSION N/A 04/30/2014   Procedure: TRANSESOPHAGEAL ECHOCARDIOGRAM (TEE);  Surgeon: Jake Bathe, MD;  Location: [facility] ENDOSCOPY;  Service: Cardiovascular;  Laterality: N/A;  . TRICUSPID VALVE REPLACEMENT N/A 12/30/2013   Procedure: REPAIR OF TRICUSPID VALVE LEAFLET;  Surgeon: Delight Ovens, MD;  Location: [facility] OR;  Service: Open Heart Surgery;  Laterality: N/A;  . TRICUSPID VALVE REPLACEMENT N/A 02/12/2014   Procedure: TRICUSPID VALVE REPAIR, REDO MEDIAN STERNOTOMY, REPAIR OF VENTRICULAR SEPTAL DEFECT (RECURRENT LV OUTFLOW TRACT TO RIGHT ATRIAL FISTULA), REDO CLOSURE OF PATENT FORAMEN OVALE;  Surgeon: Purcell Nails, MD;  Location: [facility] OR;  Service: Open Heart Surgery;  Laterality: N/A;  . VENOGRAM Left 04/06/2019   Procedure: Venogram;  Surgeon: Marinus Maw, MD;  Location: [facility] OR;  Service: Cardiovascular;   Laterality: Left;    Current Medications: Current Meds  Medication Sig  . acetaminophen (TYLENOL) 650 MG CR tablet Take 650 mg by mouth every 8 (eight) hours as needed for pain.   Marland Kitchen amoxicillin (AMOXIL) 500 MG capsule Take 1 capsule (500 mg total) by mouth 3 (three) times daily.     Allergies:   Cefepime and Vancomycin   Social History   Socioeconomic History  . Marital status: Married    Spouse name: Not on file  . Number of children: Not on file  . Years of education: Not on file  . Highest education level: Not on file  Occupational History  . Not on file  Tobacco Use  . Smoking status: Never Smoker  . Smokeless tobacco: Former Engineer, water and Sexual Activity  . Alcohol use: Yes    Alcohol/week: 0.0 standard drinks    Comment: socially  . Drug use: No  . Sexual activity: Yes    Birth control/protection: Condom  Other Topics Concern  . Not on file  Social History Narrative  . Not on file   Social Determinants of Health   Financial Resource Strain:   . Difficulty of Paying Living Expenses: Not on file  Food Insecurity:   . Worried About Programme researcher, broadcasting/film/video in the Last Year: Not on file  . Ran Out of Food in the Last Year: Not on file  Transportation Needs:   . Lack of Transportation (Medical): Not on file  . Lack of Transportation (Non-Medical): Not on file  Physical Activity:   . Days of Exercise per Week: Not on file  . Minutes of Exercise per Session: Not on file  Stress:   . Feeling of Stress : Not on file  Social Connections:   . Frequency of Communication with Friends and Family: Not on file  . Frequency of Social Gatherings with Friends and Family: Not on file  . Attends Religious Services: Not on file  . Active Member of Clubs or Organizations: Not on file  . Attends Banker Meetings: Not on file  . Marital Status: Not on file     Family History: The patient's family history includes Heart murmur in his brother.  ROS:   Please  see the history of present illness.     All other systems reviewed and are negative.  EKGs/Labs/Other Studies Reviewed:    The following studies were reviewed today:  Echo 10/09/2019 IMPRESSIONS    1. Left ventricular ejection fraction, by estimation, is 40 to 45%. The  left ventricle has mildly decreased function. The left ventricle has no  regional wall motion abnormalities. Left ventricular diastolic function  could not be evaluated.  2. Right ventricular systolic function is low normal. The right  ventricular size is normal.  3. The mitral valve is grossly normal.  No evidence of mitral valve  regurgitation.  4. The tricuspid valve is has been repaired/replaced.  5. The aortic valve is tricuspid. Aortic valve regurgitation is mild.  Mild to moderate aortic valve stenosis.  6. Aortic root/ascending aorta has been repaired/replaced.   EKG:  EKG is not ordered today.    Recent Labs: 10/08/2019: BUN 22; Creatinine, Ser 1.11; Potassium 4.9; Sodium 142 10/09/2019: Hemoglobin 16.6; Platelets 203  Recent Lipid Panel    Component Value Date/Time   TRIG 119 12/29/2013 2200    Physical Exam:    VS:  BP 118/76   Pulse 80   Ht 5\' 5"  (1.651 m)   Wt 236 lb (107 kg)   SpO2 98%   BMI 39.27 kg/m     Wt Readings from Last 3 Encounters:  10/22/19 236 lb (107 kg)  10/08/19 233 lb (105.7 kg)  10/06/19 250 lb 14.1 oz (113.8 kg)     GEN:  Well nourished, well developed in no acute distress HEENT: Normal NECK: No JVD; No carotid bruits LYMPHATICS: No lymphadenopathy CARDIAC: RRR, no murmurs, rubs, gallops RESPIRATORY:  Clear to auscultation without rales, wheezing or rhonchi  ABDOMEN: Soft, non-tender, non-distended MUSCULOSKELETAL:  No edema; No deformity  SKIN: Warm and dry NEUROLOGIC:  Alert and oriented x 3 PSYCHIATRIC:  Normal affect   ASSESSMENT:    1. Dizziness   2. S/P AVR   3. Pacemaker   4. NICM (nonischemic cardiomyopathy) (HCC)   5. History of SBE  (subacute bacterial endocarditis)    PLAN:    In order of problems listed above:  1. Dizziness: Patient has been referred to neurology service for further evaluation.  Although he had dizziness previously with endocarditis, however recent blood culture was negative x2 and echocardiogram did not show significant vegetation.  He does have mild to moderate aortic stenosis which is unlikely to account for his symptoms.  2. History of AVR: Stable on recent echocardiogram  3. CHB s/p PPM: Followed by electrophysiology service.  Underwent a device change out earlier this year  4. Nonischemic cardiomyopathy: EF 40 to 45% on echocardiogram.  No sign of heart failure  5. History of endocarditis: Recent blood culture was negative x2.  We did prophylactically place the patient on amoxicillin as he was originally supposed to be discharged on amoxicillin in February after his device change out, however he failed to pick up the medication and the has not been on antibiotic for the past 6 months despite recommendation.  He has been reestablished with Dr. March, I recommend him to check with Dr. Algis Liming regarding the duration of antibiotic therapy.   Medication Adjustments/Labs and Tests Ordered: Current medicines are reviewed at length with the patient today.  Concerns regarding medicines are outlined above.  No orders of the defined types were placed in this encounter.  No orders of the defined types were placed in this encounter.   Patient Instructions  Medication Instructions:  Your physician recommends that you continue on your current medications as directed. Please refer to the Current Medication list given to you today.  *If you need a refill on your cardiac medications before your next appointment, please call your pharmacy*  Lab Work: NONE ordered at this time of appointment   If you have labs (blood work) drawn today and your tests are completely normal, you will receive your results only  by: Algis Liming MyChart Message (if you have MyChart) OR . A paper copy in the mail If you have any lab test  that is abnormal or we need to change your treatment, we will call you to review the results.  Testing/Procedures: NONE ordered at this time of appointment   Follow-Up: At [facility], you and your health needs are our priority.  As part of our continuing mission to provide you with exceptional heart care, we have created designated Provider Care Teams.  These Care Teams include your primary Cardiologist (physician) and Advanced Practice Providers (APPs -  Physician Assistants and Nurse Practitioners) who all work together to provide you with the care you need, when you need it.  We recommend signing up for the patient portal called "MyChart".  Sign up information is provided on this After Visit Summary.  MyChart is used to connect with patients for Virtual Visits (Telemedicine).  Patients are able to view lab/test results, encounter notes, upcoming appointments, etc.  Non-urgent messages can be sent to your provider as well.   To learn more about what you can do with MyChart, go to [email or web address].    Your next appointment:   3-4 month(s)  The format for your next appointment:   In Person  Provider:   Olga Millers, MD  Other Instructions      Signed, Azalee Course, PA  10/24/2019 9:59 PM    [facility]

## 2019-10-24 ENCOUNTER — Encounter: Payer: Self-pay | Admitting: Physician Assistant

## 2019-10-29 ENCOUNTER — Encounter (HOSPITAL_COMMUNITY): Payer: Self-pay

## 2019-10-29 ENCOUNTER — Other Ambulatory Visit: Payer: Self-pay

## 2019-10-29 ENCOUNTER — Ambulatory Visit (HOSPITAL_COMMUNITY)
Admission: RE | Admit: 2019-10-29 | Discharge: 2019-10-29 | Disposition: A | Discharge status: Home / Self Care | Payer: 59 | Source: Ambulatory Visit | Attending: Infectious Disease | Admitting: Infectious Disease

## 2019-10-29 DIAGNOSIS — G039 Meningitis, unspecified: Secondary | ICD-10-CM | POA: Insufficient documentation

## 2019-10-29 MED ORDER — IOHEXOL 300 MG/ML  SOLN
75.0000 mL | Freq: Once | INTRAMUSCULAR | Status: AC | PRN
  Administered 2019-10-29: 75 mL via INTRAVENOUS

## 2019-10-29 NOTE — Progress Notes (Signed)
.  h 

## 2019-11-02 ENCOUNTER — Telehealth: Payer: Self-pay | Admitting: *Deleted

## 2019-11-02 ENCOUNTER — Other Ambulatory Visit: Payer: Self-pay | Admitting: Infectious Disease

## 2019-11-02 DIAGNOSIS — G039 Meningitis, unspecified: Secondary | ICD-10-CM

## 2019-11-02 NOTE — Telephone Encounter (Signed)
Patient called for results. RN relayed per Dr Daiva Eves.   He will come in 9/23 for repeat cbc as previously planned.  He will stop the amoxicillin.  He knows to expect a phone call regarding lumbar puncture scheduling for next month. Andree Coss, RN                Daiva Eves, Lisette Grinder, MDPhysicianSigned 9:52 AM             Brinson's CT head was normal  I would like him to now stop his amoxicillin and have an IR guided lumbar puncture in one month

## 2019-11-02 NOTE — Progress Notes (Signed)
[name]'s CT head was normal  I would like him to now stop his amoxicillin and have an IR guided lumbar puncture in one month

## 2019-11-02 NOTE — Progress Notes (Signed)
Patient called for results. RN relayed per Dr Daiva Eves.   He will come in 9/23 for repeat cbc as previously planned.  He will stop the amoxicillin.  He knows to expect a phone call regarding lumbar puncture scheduling for next month. Andree Coss, RN

## 2019-11-05 ENCOUNTER — Other Ambulatory Visit: Payer: Self-pay

## 2019-11-05 ENCOUNTER — Other Ambulatory Visit: Payer: 59

## 2019-11-05 DIAGNOSIS — D702 Other drug-induced agranulocytosis: Secondary | ICD-10-CM

## 2019-11-05 LAB — CBC WITH DIFFERENTIAL/PLATELET
Absolute Monocytes: 597 cells/uL (ref 200–950)
Basophils Absolute: 41 cells/uL (ref 0–200)
Basophils Relative: 0.7 %
Eosinophils Absolute: 360 cells/uL (ref 15–500)
Eosinophils Relative: 6.2 %
HCT: 44.8 % (ref 38.5–50.0)
Hemoglobin: 15.2 g/dL (ref 13.2–17.1)
Lymphs Abs: 1340 cells/uL (ref 850–3900)
MCH: 30 pg (ref 27.0–33.0)
MCHC: 33.9 g/dL (ref 32.0–36.0)
MCV: 88.4 fL (ref 80.0–100.0)
MPV: 10.5 fL (ref 7.5–12.5)
Monocytes Relative: 10.3 %
Neutro Abs: 3463 cells/uL (ref 1500–7800)
Neutrophils Relative %: 59.7 %
Platelets: 214 10*3/uL (ref 140–400)
RBC: 5.07 10*6/uL (ref 4.20–5.80)
RDW: 13.4 % (ref 11.0–15.0)
Total Lymphocyte: 23.1 %
WBC: 5.8 10*3/uL (ref 3.8–10.8)

## 2019-11-07 ENCOUNTER — Telehealth: Payer: Self-pay | Admitting: Neurology

## 2019-11-07 NOTE — Telephone Encounter (Signed)
[name], Patient was referred for headache and concern for meningitis. I did reach out to ID when I received the referral and we agreed that ID would order a CT of the head w/wo contrast as well as a lumbar puncture and [facility] would see him afterwards as long as all was negative for any alternative infectious reason for headaches that ID would manage (he has a past medical history of meningitis). Looks like Dr. Daiva Eves completed the CT but has ordered the LP for approx 10/20. So I would reschedule his new patient appointment which is this week instead to the end of October or November when workup is scheduled to be completed. He can be scheduled with  first available [facility] physician at that time based on my discussion with referring physician as above.  Please block the appointment if patient agrees thanks. Thanks

## 2019-11-09 NOTE — Telephone Encounter (Signed)
FYI pt now has an appt with Dr Marjory Lies on 01/01/20.

## 2019-11-12 ENCOUNTER — Ambulatory Visit: Payer: Self-pay | Admitting: Neurology

## 2019-11-13 ENCOUNTER — Telehealth: Payer: Self-pay | Admitting: *Deleted

## 2019-11-13 NOTE — Telephone Encounter (Signed)
Scheduled 12/02/19 at 10:00, [facility]. Patient will need to be in admitting at 8:30, NPO after midnight, will need driver.  [phone] if he needs to reschedule.  Patient accepted appointment. Andree Coss, RN

## 2019-11-13 NOTE — Telephone Encounter (Signed)
RN called Radiology.  Victorino Dike will reach out to a radiologist for review and to schedule the IR guided lumbar puncture. Dr Daiva Eves is hoping for this to be done around 12/02/19. Andree Coss, RN

## 2019-11-13 NOTE — Telephone Encounter (Signed)
-----   Message from Randall Hiss, MD sent at 11/02/2019  9:57 AM EDT ----- His CT had was normal I would like to get a lumbar puncture done with him off antibiotics so he could stop his antibiotics and we can schedule a IR guided lumbar puncture for 1 month's time not be great I put orders in the computer may need radiology to make sure that put in the labs the correct way.

## 2019-12-01 ENCOUNTER — Other Ambulatory Visit: Payer: Self-pay | Admitting: Student

## 2019-12-02 ENCOUNTER — Other Ambulatory Visit: Payer: Self-pay

## 2019-12-02 ENCOUNTER — Ambulatory Visit (HOSPITAL_COMMUNITY)
Admission: RE | Admit: 2019-12-02 | Discharge: 2019-12-02 | Disposition: A | Discharge status: Home / Self Care | Payer: 59 | Source: Ambulatory Visit | Attending: Infectious Disease | Admitting: Infectious Disease

## 2019-12-02 DIAGNOSIS — G039 Meningitis, unspecified: Secondary | ICD-10-CM | POA: Insufficient documentation

## 2019-12-02 LAB — CSF CELL COUNT WITH DIFFERENTIAL
RBC Count, CSF: 2 /mm3 — ABNORMAL HIGH
Tube #: 3
WBC, CSF: 2 /mm3 (ref 0–5)

## 2019-12-02 LAB — PROTEIN, CSF: Total  Protein, CSF: 34 mg/dL (ref 15–45)

## 2019-12-02 LAB — GLUCOSE, CSF: Glucose, CSF: 52 mg/dL (ref 40–70)

## 2019-12-02 MED ORDER — ACETAMINOPHEN 500 MG PO TABS
1000.0000 mg | ORAL_TABLET | Freq: Four times a day (QID) | ORAL | Status: DC | PRN
  Filled 2019-12-02: qty 2

## 2019-12-02 MED ORDER — LIDOCAINE HCL (PF) 1 % IJ SOLN
5.0000 mL | Freq: Once | INTRAMUSCULAR | Status: AC
  Administered 2019-12-02: 5 mL via INTRADERMAL

## 2019-12-02 MED ORDER — SODIUM CHLORIDE 0.9 % IV SOLN: INTRAVENOUS | Status: DC

## 2019-12-02 NOTE — Discharge Instructions (Signed)
Myelogram  A myelogram is an imaging study of the spinal cord and the places where nerves attach to the spinal cord (nerve roots). A dye (contrast material) is injected into the spine before the X-ray. This provides a clearer image for your health care provider to see. You may need this study done if you have a spinal cord problem that cannot be diagnosed with other imaging studies, such as a CT scan or an MRI. You may also have this study to check your spine after surgery. Tell a health care provider about:  Any allergies you have, especially to iodine.  All medicines you are taking, including vitamins, herbs, eye drops, creams, and over-the-counter medicines.  Any problems you or family members have had with anesthetic medicines or contrast material.  Any blood disorders you have.  Any surgeries you have had.  Any medical conditions you have or have had, including asthma.  Whether you are pregnant or may be pregnant. What are the risks? Generally, this is a safe procedure. However, problems may occur, including:  Infection.  Bleeding.  Allergic reaction to medicines or dyes.  Damage to your spinal cord or nerves.  Loss or leaking of spinal fluid. This can lead to headaches.  Damage to kidneys.  Seizures. This is rare. What happens before the procedure?  Follow instructions from your health care provider about eating or drinking restrictions. You may be asked to drink more fluids.  Ask your health care provider about changing or stopping your regular medicines. This is especially important if you are taking diabetes medicines or blood thinners.  Plan to have someone take you home from the hospital or clinic.  If you will be going home right after the procedure, plan to have someone with you for 24 hours. What happens during the procedure?  You will lie face down on a table.  Your health care provider will locate the best injection site on your spine. This is most  often in the lower back.  The area of injection will be washed with soap.  You will be given a medicine to numb the area (local anesthetic).  Your health care provider will insert a long needle into the space around your spinal cord (subarachnoid space).  A sample of spinal fluid may be taken and sent to the lab for testing.  The contrast material will be injected into the subarachnoid space.  The exam table may be tilted to help the contrast material flow up or down your spine.  The X-ray will take images of your spinal cord for your health care provider to examine.  A bandage (dressing) may be placed over the injection site. The procedure may vary among health care providers and hospitals. What can I expect after this procedure?  Your blood pressure, heart rate, breathing rate, and blood oxygen level may be monitored until you leave the hospital or clinic.  You may have: ? Soreness on your injection site. ? A mild headache.  You will be asked to lie down with your head raised (elevated). This reduces the risk of a headache.  It is up to you to get the results of your procedure. Ask your health care provider, or the department that is doing the procedure, when your results will be ready. Follow these instructions at home:   Rest as told by your health care provider. Lie flat with your head slightly elevated to reduce the risk of a headache.  Do not bend, lift, or do hard work   for 24-48 hours, or as told by your health care provider.  Take over-the-counter and prescription medicines only as told by your health care provider.  Take care of your dressing as told by your health care provider.  Drink enough fluid to keep your urine pale yellow.  Bathe or shower as told by your health care provider. Contact a health care provider if:  You have a fever.  You have a headache that lasts longer than 24 hours.  You feel nauseous or vomit.  You have a stiff neck or numbness in  your legs.  You are unable to urinate or have a bowel movement.  You develop a rash, itching, or sneezing. Get help right away if:  You have new symptoms or your symptoms get worse.  You have a seizure.  You have trouble breathing. Summary  A myelogram is an imaging study of the spinal cord and the places where nerves attach to the spinal cord (nerve roots).  Before the procedure, follow instructions from your health care provider about changing or stopping your regular medicines, and eating and drinking restrictions.  After this procedure, you will be asked to lie down with your head raised (elevated). This reduces your risk of a headache.  Do not bend, lift, or do hard work for 24-48 hours, or as told by your health care provider.  Contact a health care provider if you have a stiff neck or numbness in your legs. Get help right away if symptoms get worse, or you have a seizure or trouble breathing. This information is not intended to replace advice given to you by your health care provider. Make sure you discuss any questions you have with your health care provider. Document Revised: 04/09/2018 Document Reviewed: 04/10/2018 Elsevier Patient Education  2020 [employer] Inc.  

## 2019-12-05 LAB — CSF CULTURE W GRAM STAIN: Culture: NO GROWTH

## 2019-12-09 ENCOUNTER — Telehealth: Payer: Self-pay

## 2019-12-09 NOTE — Telephone Encounter (Signed)
Patient left vm on triage line requesting results from LP done on 10/20. Will forward message to Md to review results. Will call patient with MD response. Patient is scheduled to follow up on 11/17

## 2019-12-09 NOTE — Telephone Encounter (Signed)
LP looks great without evidence w infection. He Should see neurology for work up of his headaches

## 2019-12-10 NOTE — Telephone Encounter (Signed)
Relayed message to patient. PT would like to know if he will need to keep visit on 11/17 or follow up as needed.

## 2019-12-10 NOTE — Telephone Encounter (Signed)
Follow up In 3 months

## 2019-12-30 ENCOUNTER — Ambulatory Visit: Payer: 59 | Admitting: Infectious Disease

## 2020-01-01 ENCOUNTER — Ambulatory Visit (INDEPENDENT_AMBULATORY_CARE_PROVIDER_SITE_OTHER): Payer: 59 | Admitting: Diagnostic Neuroimaging

## 2020-01-01 ENCOUNTER — Encounter: Payer: Self-pay | Admitting: Diagnostic Neuroimaging

## 2020-01-01 VITALS — BP 108/63 | HR 83 | Ht 65.0 in | Wt 236.6 lb

## 2020-01-01 DIAGNOSIS — G43109 Migraine with aura, not intractable, without status migrainosus: Secondary | ICD-10-CM | POA: Diagnosis not present

## 2020-01-01 NOTE — Progress Notes (Signed)
[facility]  PATIENT: [name] DOB: 1985-09-11  REFERRING CLINICIAN: Daiva Eves, Lisette Grinder, MD HISTORY FROM: patient  REASON FOR VISIT: new consult    HISTORICAL  CHIEF COMPLAINT:  Chief Complaint  Patient presents with   Headache    rm 6 New Pt "for my symptoms all heart related issues ruled out; LP results good; hx of migraines"   Dizziness   Blurry Vision    HISTORY OF PRESENT ILLNESS:   34 year old male here for evaluation of numbness and headaches.  Patient has history of migraine headaches since 2008 with left face and left hand numbness and tingling, blurred vision, visual disturbance, frontal and occipital headaches.  Sometimes nausea, sensitivity light and sound.  Patient was having 1 headache per month, managed with over-the-counter medications.  Family history of migraine in his mother.  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  He underwent human allograft aortic root and valve replacement, repair of aorta to right atrium fistula and tricuspid valve repair and PFO closure surgeries on 12/30/2013.  He developed postoperative heart block requiring pacemaker.  Several weeks later he developed acute heart failure, requiring redo sternotomy, bovine patch repair of VSD, tricuspid valve repair, PFO closure.  Patient has been managed by cardiology infectious disease since that time.  Interestingly following his diagnosis of endocarditis, meningitis and heart surgeries, patient continued with migraine phenomenon of numbness and tingling and vision changes but his headaches seem to resolve.  August 2021, the day after his second child was delivered via C-section, patient had onset  of dizziness, numbness, headaches, vision changes.  Patient followed up with cardiology and ID clinic.  He had CT scan of the head and lumbar puncture which showed no evidence of recurrent meningitis or infection.  Past few weeks symptoms have resolved.  He continues to have rare episodes of unilateral numbness, similar to his prior migraine events.  A few days ago he was under increased stress related to a family member and following that he developed some migraine headache symptoms.   REVIEW OF SYSTEMS: Full 14 system review of systems performed and negative with exception of: As per HPI.  ALLERGIES: Allergies  Allergen Reactions   Cefepime Other (See Comments)    Leukopenia not clear if due to vancomycin vs cefepime. WBC dropped down   Vancomycin Other (See Comments)    ? Drug induced leukopenia vs being due to cefepime. WBC dropped down    HOME MEDICATIONS: Outpatient Medications Prior to Visit  Medication Sig Dispense Refill   acetaminophen (TYLENOL) 500 MG tablet Take 1,000 mg by mouth every 6 (six) hours as needed for moderate pain or headache.     aspirin EC 81 MG tablet Take 81 mg by mouth daily.      aspirin-acetaminophen-caffeine (EXCEDRIN MIGRAINE) 250-250-65 MG tablet Take by mouth every 6 (six) hours as needed for headache.     bismuth subsalicylate (PEPTO BISMOL) 262 MG/15ML suspension Take 30 mLs by mouth every 6 (six) hours as needed for indigestion or diarrhea or loose stools.     amoxicillin (AMOXIL) 500 MG capsule Take 1 capsule (500 mg total) by mouth 3 (three) times daily. (Patient not taking: Reported on 11/26/2019) 90 capsule 6   No facility-administered medications prior to visit.  PAST MEDICAL HISTORY: Past Medical History:  Diagnosis Date   AKI (acute kidney injury) (HCC)    Aortic valve vegetation on echo 04/29/14 04/29/2014   managed medically   Brain abscess    Congestive dilated cardiomyopathy (HCC) 06/23/2015   Drug-induced leukopenia  (HCC) 06/15/2014   Endocarditis of aortic valve-November 2015    Fracture of left femur (HCC)    History of open heart surgery    December 30, 2013- ascending aortic root replacement, TEE   Meningitis    Group B Strep (November 2015)   Migraine headache 10/15/2019   since 2008   Obesity    Patent foramen ovale    Closed during procedure on December 30, 2013   Presence of permanent cardiac pacemaker 01/04/2014   PPM MEDTRONIC FOR COMPLETE HEART BLOCK   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   S/P aortic root and valve allograft 12/30/2013   Human allograft aortic root replacement with repair of aorta-right atrial fistula and tricuspid valve repair   S/P placement of cardiac pacemaker 01/04/2014   Medtronic (serial number [id number]) dual chamber permanent pacemaker implanted by Dr Ladona Ridgel   S/P redo patent foramen ovale closure 02/12/2014   S/P redo tricuspid valve repair 02/12/2014   Complex valvuloplasty including patch closure of VSD with plication of septal leaflet and 26 mm Edwards mc3 ring annuloplasty   S/P ventricular septal defect repair + redo tricuspid valve repair + redo closure PFO 02/12/2014   Redo sternotomy for bovine pericardial patch repair of large ventricular septal defect (recurrent LVOT to RA fistula due to bacterial endocarditis)   Smokeless tobacco use    Third degree heart block (HCC) 12/31/2013   Thrombocytopenia (HCC) 12/2013   Tricuspid regurgitation 02/11/2014   Ventricular septal defect 02/12/2014   Post-infectious s/p repair of LVOT to RA fistula for bacterial endocarditis    PAST SURGICAL HISTORY: Past Surgical History:  Procedure Laterality Date   ASCENDING AORTIC ROOT REPLACEMENT N/A 12/30/2013   Procedure: ASCENDING AORTIC ROOT REPLACEMENT with 23mm HOMOGRAFT, CLOSURE OF AORTIC TO RIGHT ATRIAL FISTULA, CLOSURE OF PATENT FORAMEN OVALE;  Surgeon: Delight Ovens, MD;  Location: [facility] OR;  Service: Open Heart Surgery;  Laterality: N/A;    INSERT / REPLACE / REMOVE PACEMAKER  01/04/2014   INTRAOPERATIVE TRANSESOPHAGEAL ECHOCARDIOGRAM N/A 12/30/2013   Procedure: INTRAOPERATIVE TRANSESOPHAGEAL ECHOCARDIOGRAM;  Surgeon: Delight Ovens, MD;  Location: [facility] OR;  Service: Open Heart Surgery;  Laterality: N/A;   PACEMAKER INSERTION Left 04/06/2019   Procedure: PACEMAKER LEAD INSERTION;  Surgeon: Marinus Maw, MD;  Location: [facility] OR;  Service: Cardiovascular;  Laterality: Left;   PACEMAKER LEAD REMOVAL N/A 04/06/2019   Procedure: PACEMAKER LEAD REMOVAL;  Surgeon: Marinus Maw, MD;  Location: [facility] OR;  Service: Cardiovascular;  Laterality: N/A;   PERMANENT PACEMAKER INSERTION N/A 01/04/2014   Procedure: PERMANENT PACEMAKER INSERTION;  Surgeon: Marinus Maw, MD;  Location: [facility] CATH LAB;  Service: Cardiovascular;  Laterality: N/A;   RIGHT HEART CATHETERIZATION N/A 02/12/2014   Procedure: RIGHT HEART CATH;  Surgeon: Laurey Morale, MD;  Location: [facility] CATH LAB;  Service: Cardiovascular;  Laterality: N/A;   TEE WITHOUT CARDIOVERSION N/A 02/12/2014   Procedure: TRANSESOPHAGEAL ECHOCARDIOGRAM (TEE);  Surgeon: Quintella Reichert, MD;  Location: [facility] ENDOSCOPY;  Service: Cardiovascular;  Laterality: N/A;   TEE WITHOUT CARDIOVERSION N/A 04/30/2014   Procedure: TRANSESOPHAGEAL ECHOCARDIOGRAM (TEE);  Surgeon: Jake Bathe, MD;  Location: [facility] ENDOSCOPY;  Service: Cardiovascular;  Laterality: N/A;   TRICUSPID VALVE REPLACEMENT N/A 12/30/2013  Procedure: REPAIR OF TRICUSPID VALVE LEAFLET;  Surgeon: Delight Ovens, MD;  Location: [facility] OR;  Service: Open Heart Surgery;  Laterality: N/A;   TRICUSPID VALVE REPLACEMENT N/A 02/12/2014   Procedure: TRICUSPID VALVE REPAIR, REDO MEDIAN STERNOTOMY, REPAIR OF VENTRICULAR SEPTAL DEFECT (RECURRENT LV OUTFLOW TRACT TO RIGHT ATRIAL FISTULA), REDO CLOSURE OF PATENT FORAMEN OVALE;  Surgeon: Purcell Nails, MD;  Location: [facility] OR;  Service: Open Heart Surgery;  Laterality: N/A;   VENOGRAM Left 04/06/2019   Procedure:  Venogram;  Surgeon: Marinus Maw, MD;  Location: [facility] OR;  Service: Cardiovascular;  Laterality: Left;    FAMILY HISTORY: Family History  Problem Relation Age of Onset   Migraines Mother    Migraines Brother    Heart murmur Brother     SOCIAL HISTORY: Social History   Socioeconomic History   Marital status: Married    Spouse name: Sirena   Number of children: 2   Years of education: 12   Highest education level: Some college, no degree  Occupational History   Not on file  Tobacco Use   Smoking status: Never Smoker   Smokeless tobacco: Former Engineer, water and Sexual Activity   Alcohol use: Yes    Alcohol/week: 0.0 standard drinks    Comment: socially   Drug use: No   Sexual activity: Yes    Birth control/protection: Condom  Other Topics Concern   Not on file  Social History Narrative   Lives with wife, children   Caffeine- coffee, 1 c, 2-3 cans soda a day   Social Determinants of Health   Financial Resource Strain:    Difficulty of Paying Living Expenses: Not on file  Food Insecurity:    Worried About Programme researcher, broadcasting/film/video in the Last Year: Not on file   [employer] of Food in the Last Year: Not on file  Transportation Needs:    Lack of Transportation (Medical): Not on file   Lack of Transportation (Non-Medical): Not on file  Physical Activity:    Days of Exercise per Week: Not on file   Minutes of Exercise per Session: Not on file  Stress:    Feeling of Stress : Not on file  Social Connections:    Frequency of Communication with Friends and Family: Not on file   Frequency of Social Gatherings with Friends and Family: Not on file   Attends Religious Services: Not on file   Active Member of Clubs or Organizations: Not on file   Attends Banker Meetings: Not on file   Marital Status: Not on file  Intimate Partner Violence:    Fear of Current or Ex-Partner: Not on file   Emotionally Abused: Not on file   Physically  Abused: Not on file   Sexually Abused: Not on file     PHYSICAL EXAM  GENERAL EXAM/CONSTITUTIONAL: Vitals:  Vitals:   01/01/20 1106  BP: 108/63  Pulse: 83  Weight: 236 lb 9.6 oz (107.3 kg)  Height: 5\' 5"  (1.651 m)     Body mass index is 39.37 kg/m. Wt Readings from Last 3 Encounters:  01/01/20 236 lb 9.6 oz (107.3 kg)  10/22/19 236 lb (107 kg)  10/08/19 233 lb (105.7 kg)     Patient is in no distress; well developed, nourished and groomed; neck is supple  CARDIOVASCULAR:  Examination of carotid arteries is normal; no carotid bruits  Regular rate and rhythm, MILD SYSTOLIC MURMUS  Examination of peripheral vascular system by observation and palpation is  normal  EYES:  Ophthalmoscopic exam of optic discs and posterior segments is normal; no papilledema or hemorrhages  No exam data present  MUSCULOSKELETAL:  Gait, strength, tone, movements noted in Neurologic exam below  NEUROLOGIC: MENTAL STATUS:  No flowsheet data found.  awake, alert, oriented to person, place and time  recent and remote memory intact  normal attention and concentration  language fluent, comprehension intact, naming intact  fund of knowledge appropriate  CRANIAL NERVE:   2nd - no papilledema on fundoscopic exam  2nd, 3rd, 4th, 6th - pupils equal and reactive to light, visual fields full to confrontation, extraocular muscles intact, no nystagmus  5th - facial sensation symmetric  7th - facial strength symmetric  8th - hearing intact  9th - palate elevates symmetrically, uvula midline  11th - shoulder shrug symmetric  12th - tongue protrusion midline  MOTOR:   normal bulk and tone, full strength in the BUE, BLE  SENSORY:   normal and symmetric to light touch, temperature, vibration  COORDINATION:   finger-nose-finger, fine finger movements normal  REFLEXES:   deep tendon reflexes present and symmetric  GAIT/STATION:   narrow based gait     DIAGNOSTIC  DATA (LABS, IMAGING, TESTING) - I reviewed patient records, labs, notes, testing and imaging myself where available.  Lab Results  Component Value Date   WBC 5.8 11/05/2019   HGB 15.2 11/05/2019   HCT 44.8 11/05/2019   MCV 88.4 11/05/2019   PLT 214 11/05/2019      Component Value Date/Time   NA 142 10/08/2019 1623   K 4.9 10/08/2019 1623   CL 103 10/08/2019 1623   CO2 22 10/08/2019 1623   GLUCOSE 120 (H) 10/08/2019 1623   GLUCOSE 106 (H) 04/06/2019 0252   BUN 22 (H) 10/08/2019 1623   CREATININE 1.11 10/08/2019 1623   CREATININE 1.32 06/10/2017 1407   CALCIUM 9.9 10/08/2019 1623   PROT 7.2 04/29/2014 1538   ALBUMIN 4.1 04/29/2014 1538   AST 18 04/29/2014 1538   ALT 21 04/29/2014 1538   ALKPHOS 62 04/29/2014 1538   BILITOT 0.6 04/29/2014 1538   GFRNONAA 88 10/08/2019 1623   GFRNONAA 72 06/10/2017 1407   GFRAA 102 10/08/2019 1623   GFRAA 84 06/10/2017 1407   Lab Results  Component Value Date   TRIG 119 12/29/2013   Lab Results  Component Value Date   HGBA1C 5.5 04/06/2019   No results found for: VITAMINB12 Lab Results  Component Value Date   TSH 2.109 05/03/2014    12/24/13 MRI brain [I reviewed images myself and agree with interpretation. -VRP]  - Findings consistent with bacterial meningitis, cerebritis, and scattered microabscesses. Delineation of the extent of disease difficult due to patient motion.    10/29/19 CT head [I reviewed images myself and agree with interpretation. -VRP]  - Normal head CT  12/02/19 LP (opening pressure 25cm H2O) - Successful lumbar puncture using fluoroscopy - CSF WBC 2, RBC 2, PROT 34, GLUC 52; culture negative    ASSESSMENT AND PLAN  34 y.o. year old male here with:  -History of migraine has since 2008; significant medical event and complication in 2015 with endocarditis, meningitis, microabscess of the brain, complex cardiac surgeries; following this headaches improved; continue to have rare migrainous phenomenon since  that time until August 2021 when symptoms slightly changed.     Dx:  1. Migraine with aura and without status migrainosus, not intractable     PLAN:  MIGRAINE WITH AURA (HA, numbness, vision aura, sens  to light / sound; since 2008)  - most likely represents ongoing migraine phenomenon in the setting of stress letdown headaches (immediately following birth of child; also following family member intervention)  - now back to baseline migraine phenomenon (~1 / month numbness, vision changes; rare headaches)  - monitor for now; may consider topiramate in future if symptoms increase  Return for pending if symptoms worsen or fail to improve.    Suanne Marker, MD 01/01/2020, 11:25 AM Certified in Neurology, Neurophysiology and Neuroimaging  [facility] [address], [address] [place], [place] [address] [phone]

## 2020-01-01 NOTE — Patient Instructions (Signed)
  MIGRAINE WITH AURA (HA, numbness, vision aura, sens to light / sound; since 2008) - back to baseline migraine phenomenon (1 / month numbness, vision changes; rare headaches) - may consider topiramate in future if symptoms increase

## 2020-01-04 ENCOUNTER — Ambulatory Visit (INDEPENDENT_AMBULATORY_CARE_PROVIDER_SITE_OTHER): Payer: 59

## 2020-01-04 DIAGNOSIS — I442 Atrioventricular block, complete: Secondary | ICD-10-CM | POA: Diagnosis not present

## 2020-01-04 LAB — CUP PACEART REMOTE DEVICE CHECK
Battery Remaining Longevity: 136 mo
Battery Voltage: 3.06 V
Brady Statistic AP VP Percent: 4.1 %
Brady Statistic AP VS Percent: 0 %
Brady Statistic AS VP Percent: 95.89 %
Brady Statistic AS VS Percent: 0.01 %
Brady Statistic RA Percent Paced: 4.09 %
Brady Statistic RV Percent Paced: 99.99 %
Date Time Interrogation Session: 20211121224619
Implantable Lead Implant Date: 20151123
Implantable Lead Implant Date: 20210222
Implantable Lead Location: 753859
Implantable Lead Location: 753860
Implantable Lead Model: 5076
Implantable Lead Model: 5076
Implantable Pulse Generator Implant Date: 20210222
Lead Channel Impedance Value: 323 Ohm
Lead Channel Impedance Value: 361 Ohm
Lead Channel Impedance Value: 513 Ohm
Lead Channel Impedance Value: 703 Ohm
Lead Channel Pacing Threshold Amplitude: 0.5 V
Lead Channel Pacing Threshold Amplitude: 0.75 V
Lead Channel Pacing Threshold Pulse Width: 0.4 ms
Lead Channel Pacing Threshold Pulse Width: 0.4 ms
Lead Channel Sensing Intrinsic Amplitude: 1.375 mV
Lead Channel Sensing Intrinsic Amplitude: 1.375 mV
Lead Channel Sensing Intrinsic Amplitude: 7.875 mV
Lead Channel Sensing Intrinsic Amplitude: 7.875 mV
Lead Channel Setting Pacing Amplitude: 1.5 V
Lead Channel Setting Pacing Amplitude: 2.5 V
Lead Channel Setting Pacing Pulse Width: 0.4 ms
Lead Channel Setting Sensing Sensitivity: 5.6 mV

## 2020-01-05 NOTE — Progress Notes (Signed)
Remote pacemaker transmission.   

## 2020-01-25 NOTE — Progress Notes (Deleted)
HPI: FU AVR; initially seen in Nov 2015 with "flu" that ended up being bacterial endocarditis. He underwent aortic root and valve replacement with fistula repair. At that time he also had CHB and had a MDT PTVDP placed. Jan 2016 he had symptoms of dyspnea and was reevaluated. A TEE was done and and revealed a large VSD with left to right shunting from LVOT. On 02/12/2014 he underwent Redo Median Sternotomy, Repair of a VSD, Tricuspid Valve Repair, and Redo closure of PFO.Patient had a repeat echocardiogram on 04/29/2014 that showed mild global reduction in LV function. There was a mobile mass on the aortic valve concerning for vegetation as well as mild to moderate aortic insufficiency. The patient was admitted and IV antibiotics resumed. Blood cultures remained negative. Transesophageal echocardiogram revealed low normal LV function and an aortic valve mass. 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 Patient was evaluated by Dr. Tyrone Sage and medical therapy recommended. Most recent echocardiogram August 2021 showed ejection fraction 40 to 45%, previous tricuspid valve repair, bioprosthetic aortic valve with mild aortic insufficiency and mean gradient 18 mmHg.  Since last seen,  Current Outpatient Medications  Medication Sig Dispense Refill  . acetaminophen (TYLENOL) 500 MG tablet Take 1,000 mg by mouth every 6 (six) hours as needed for moderate pain or headache.    Marland Kitchen aspirin EC 81 MG tablet Take 81 mg by mouth daily.     Marland Kitchen aspirin-acetaminophen-caffeine (EXCEDRIN MIGRAINE) 250-250-65 MG tablet Take by mouth every 6 (six) hours as needed for headache.    . bismuth subsalicylate (PEPTO BISMOL) 262 MG/15ML suspension Take 30 mLs by mouth every 6 (six) hours as needed for indigestion or diarrhea or loose stools.     No current facility-administered medications for this visit.     Past Medical History:  Diagnosis Date  . AKI (acute kidney injury) (HCC)   . Aortic  valve vegetation on echo 04/29/14 04/29/2014   managed medically  . Brain abscess   . Congestive dilated cardiomyopathy (HCC) 06/23/2015  . Drug-induced leukopenia (HCC) 06/15/2014  . Endocarditis of aortic valve-November 2015   . Fracture of left femur (HCC)   . History of open heart surgery    December 30, 2013- ascending aortic root replacement, TEE  . Meningitis    Group B Strep (November 2015)  . Migraine headache 10/15/2019   since 2008  . Obesity   . Patent foramen ovale    Closed during procedure on December 30, 2013  . Presence of permanent cardiac pacemaker 01/04/2014   PPM MEDTRONIC FOR COMPLETE HEART BLOCK  . 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 S/P aortic root and valve allograft 12/30/2013   Human allograft aortic root replacement with repair of aorta-right atrial fistula and tricuspid valve repair  . S/P placement of cardiac pacemaker 01/04/2014   Medtronic (serial number [id number]) dual chamber permanent pacemaker implanted by Dr Ladona Ridgel  . S/P redo patent foramen ovale closure 02/12/2014  . S/P redo tricuspid valve repair 02/12/2014   Complex valvuloplasty including patch closure of VSD with plication of septal leaflet and 26 mm Edwards mc3 ring annuloplasty  . S/P ventricular septal defect repair + redo tricuspid valve repair + redo closure PFO 02/12/2014   Redo sternotomy for bovine pericardial patch repair of large ventricular septal defect (recurrent LVOT to RA fistula due to bacterial endocarditis)  . Smokeless tobacco use   . Third degree heart block (HCC) 12/31/2013  . Thrombocytopenia (HCC) 12/2013  .  Tricuspid regurgitation 02/11/2014  . Ventricular septal defect 02/12/2014   Post-infectious s/p repair of LVOT to RA fistula for bacterial endocarditis    Past Surgical History:  Procedure Laterality Date  . ASCENDING AORTIC ROOT REPLACEMENT N/A 12/30/2013   Procedure: ASCENDING AORTIC ROOT REPLACEMENT with 91mm HOMOGRAFT, CLOSURE OF AORTIC TO RIGHT ATRIAL  FISTULA, CLOSURE OF PATENT FORAMEN OVALE;  Surgeon: Delight Ovens, MD;  Location: [facility] OR;  Service: Open Heart Surgery;  Laterality: N/A;  . INSERT / REPLACE / REMOVE PACEMAKER  01/04/2014  . INTRAOPERATIVE TRANSESOPHAGEAL ECHOCARDIOGRAM N/A 12/30/2013   Procedure: INTRAOPERATIVE TRANSESOPHAGEAL ECHOCARDIOGRAM;  Surgeon: Delight Ovens, MD;  Location: [facility] OR;  Service: Open Heart Surgery;  Laterality: N/A;  . PACEMAKER INSERTION Left 04/06/2019   Procedure: PACEMAKER LEAD INSERTION;  Surgeon: Marinus Maw, MD;  Location: [facility] OR;  Service: Cardiovascular;  Laterality: Left;  . PACEMAKER LEAD REMOVAL N/A 04/06/2019   Procedure: PACEMAKER LEAD REMOVAL;  Surgeon: Marinus Maw, MD;  Location: [facility] OR;  Service: Cardiovascular;  Laterality: N/A;  . PERMANENT PACEMAKER INSERTION N/A 01/04/2014   Procedure: PERMANENT PACEMAKER INSERTION;  Surgeon: Marinus Maw, MD;  Location: [facility] CATH LAB;  Service: Cardiovascular;  Laterality: N/A;  . RIGHT HEART CATHETERIZATION N/A 02/12/2014   Procedure: RIGHT HEART CATH;  Surgeon: Laurey Morale, MD;  Location: [facility] CATH LAB;  Service: Cardiovascular;  Laterality: N/A;  . TEE WITHOUT CARDIOVERSION N/A 02/12/2014   Procedure: TRANSESOPHAGEAL ECHOCARDIOGRAM (TEE);  Surgeon: Quintella Reichert, MD;  Location: [facility] ENDOSCOPY;  Service: Cardiovascular;  Laterality: N/A;  . TEE WITHOUT CARDIOVERSION N/A 04/30/2014   Procedure: TRANSESOPHAGEAL ECHOCARDIOGRAM (TEE);  Surgeon: Jake Bathe, MD;  Location: [facility] ENDOSCOPY;  Service: Cardiovascular;  Laterality: N/A;  . TRICUSPID VALVE REPLACEMENT N/A 12/30/2013   Procedure: REPAIR OF TRICUSPID VALVE LEAFLET;  Surgeon: Delight Ovens, MD;  Location: [facility] OR;  Service: Open Heart Surgery;  Laterality: N/A;  . TRICUSPID VALVE REPLACEMENT N/A 02/12/2014   Procedure: TRICUSPID VALVE REPAIR, REDO MEDIAN STERNOTOMY, REPAIR OF VENTRICULAR SEPTAL DEFECT (RECURRENT LV OUTFLOW TRACT TO RIGHT ATRIAL FISTULA), REDO CLOSURE OF PATENT FORAMEN OVALE;   Surgeon: Purcell Nails, MD;  Location: [facility] OR;  Service: Open Heart Surgery;  Laterality: N/A;  . VENOGRAM Left 04/06/2019   Procedure: Venogram;  Surgeon: Marinus Maw, MD;  Location: [facility] OR;  Service: Cardiovascular;  Laterality: Left;    Social History   Socioeconomic History  . Marital status: Married    Spouse name: Sirena  . Number of children: 2  . Years of education: 5  . Highest education level: Some college, no degree  Occupational History  . Not on file  Tobacco Use  . Smoking status: Never Smoker  . Smokeless tobacco: Former Engineer, water and Sexual Activity  . Alcohol use: Yes    Alcohol/week: 0.0 standard drinks    Comment: socially  . Drug use: No  . Sexual activity: Yes    Birth control/protection: Condom  Other Topics Concern  . Not on file  Social History Narrative   Lives with wife, children   Caffeine- coffee, 1 c, 2-3 cans soda a day   Social Determinants of Health   Financial Resource Strain: Not on file  Food Insecurity: Not on file  Transportation Needs: Not on file  Physical Activity: Not on file  Stress: Not on file  Social Connections: Not on file  Intimate Partner Violence: Not on file    Family History  Problem Relation Age  of Onset  . Migraines Mother   . Migraines Brother   . Heart murmur Brother     ROS: no fevers or chills, productive cough, hemoptysis, dysphasia, odynophagia, melena, hematochezia, dysuria, hematuria, rash, seizure activity, orthopnea, PND, pedal edema, claudication. Remaining systems are negative.  Physical Exam: Well-developed well-nourished in no acute distress.  Skin is warm and dry.  HEENT is normal.  Neck is supple.  Chest is clear to auscultation with normal expansion.  Cardiovascular exam is regular rate and rhythm.  Abdominal exam nontender or distended. No masses palpated. Extremities show no edema. neuro grossly intact  ECG- personally reviewed  A/P  1 previous aortic valve/aortic root  replacement-continue SBE prophylaxis.  2 history of mild to moderate LV dysfunction-continue beta-blocker.  3 prior VSD repair/PFO closure and tricuspid valve repair-continue SBE prophylaxis.  4 prior pacemaker-Per electrophysiology.  Olga Millers, MD

## 2020-01-28 ENCOUNTER — Ambulatory Visit: Payer: 59 | Admitting: Cardiology

## 2020-02-22 ENCOUNTER — Telehealth: Payer: Self-pay | Admitting: Nurse Practitioner

## 2020-02-22 ENCOUNTER — Telehealth: Payer: Self-pay | Admitting: Internal Medicine

## 2020-02-22 NOTE — Telephone Encounter (Signed)
Patient has lots of questions about his pacemaker and what he can and cant do. Patient would like a nurse to call him back

## 2020-02-22 NOTE — Telephone Encounter (Signed)
STAT if HR is under 50 or over 120 (normal HR is 60-100 beats per minute)  1) What is your heart rate? 117  2) Do you have a log of your heart rate readings (document readings)? No, keeps track on his smart watch.  3) Do you have any other symptoms? Patient states that he got a new pace maker put in early 2021 and he started back working out on 02/13/2020. He states that any time he does any cardio his heart rate drops to low 70's and 60's. He has concerns about this and wonders if he should continue to work out. Please advise.

## 2020-02-22 NOTE — Telephone Encounter (Signed)
Attempted to call patient back, no answer.  LVM requesting callback with device clinic phone number.

## 2020-02-22 NOTE — Telephone Encounter (Signed)
   Pt called this evening with concerns related to his HR.  He's not having any symptoms but has noted fluctuations in HRs when on the treadmill at the gym.  I rec that he send in a remote device transmission, which he has already done.  Reassurance offered.  Nicolasa Ducking, NP 02/22/2020, 5:53 PM

## 2020-02-23 ENCOUNTER — Telehealth: Payer: Self-pay | Admitting: Internal Medicine

## 2020-02-23 NOTE — Telephone Encounter (Signed)
° ° ° ° °  I went in pt's chart to see who had called him. The call was transferred to kim in the Device [facility]

## 2020-02-23 NOTE — Telephone Encounter (Signed)
Patient reports he has noticed hid HR fluctuate from 140s during exercise to 60 bpm when he stops exercising . Patient reports no CP, SOB,chest pressure, palpitations or any physical issues while exercising. Remote transmission was sent 02/22/20 and reviewed . Transmission showed device function WNL, no events or episodes of high atrial or high ventricular rates, presenting AS/VP, AP 0%, VP 100%. Patient reassured device functioning properly, and that his watch and exercise equipment are not as accurate as the pacemaker in determining HR. Patient overdue for f/u with Dr Ladona Ridgel and Dr Jens Som per patient. Will send to scheduler for appointments. Questions answered.

## 2020-02-24 ENCOUNTER — Telehealth: Payer: Self-pay | Admitting: Cardiology

## 2020-02-24 NOTE — Telephone Encounter (Signed)
Pt c/o BP issue: STAT if pt c/o blurred vision, one-sided weakness or slurred speech  1. What are your last 5 BP readings?  02/24/20:  127/88 127/70  2. Are you having any other symptoms (ex. Dizziness, headache, blurred vision, passed out)? Dizziness   3. What is your BP issue? Patient called to report his BP readings taken this morning and recent dizzy spells. He also scheduled an appointment for 02/29/20 with Dr. Jens Som.   STAT if patient feels like he/she is going to faint   1) Are you dizzy now? Yes   2) Do you feel faint or have you passed out? No   3) Do you have any other symptoms? No   4) Have you checked your HR and BP (record if available)? Patient states he does not document his readings, but his HR has been in the 100's when at rest. No additional symptoms reported.

## 2020-02-24 NOTE — Telephone Encounter (Signed)
Spoke with pt who state he was seen back in Sept for dizziness which he report was ruled out non cardiac related. Pt state symptoms has re-occurred and feel like he never got answers. Pt also want to report occasional sharp chest but denies symptoms currently.  Pt has an appointment scheduled with Dr. Jens Som on 1/17. Nurse advised to keep appointment and to report to ER if CP reoccur or experience symptoms of feeling like he's going to pass out.   BP 127/88 127/70  Will route to MD for any further recommendations

## 2020-02-25 ENCOUNTER — Encounter: Payer: Self-pay | Admitting: *Deleted

## 2020-02-26 NOTE — Progress Notes (Signed)
Virtual Visit via Video Note   This visit type was conducted due to national recommendations for restrictions regarding the COVID-19 Pandemic (e.g. social distancing) in an effort to limit this patient's exposure and mitigate transmission in our community.  Due to his co-morbid illnesses, this patient is at least at moderate risk for complications without adequate follow up.  This format is felt to be most appropriate for this patient at this time.  All issues noted in this document were discussed and addressed.  A limited physical exam was performed with this format.  Please refer to the patient's chart for his consent to telehealth for [facility].      Date:  02/29/2020   ID:  [name], DOB August 14, 1985, MRN [id number]  Patient Location:Home Provider Location: Home  PCP:  [name], MD  Cardiologist:  Dr Jens Som  Evaluation Performed:  Follow-Up Visit  Chief Complaint:  FU AVR  History of Present Illness:    FU AVR; initially seen in Nov 2015 with "flu" that ended up being bacterial endocarditis. He underwent aortic root and valve replacement with fistula repair. At that time he also had CHB and had a MDT PTVDP placed. Jan 2016 he had symptoms of dyspnea and was reevaluated. A TEE was done and and revealed a large VSD with left to right shunting from LVOT. On 02/12/2014 he underwent Redo Median Sternotomy, Repair of a VSD, Tricuspid Valve Repair, and Redo closure of PFO.Patient had a repeat echocardiogram on 04/29/2014 that showed mild global reduction in LV function. There was a mobile mass on the aortic valve concerning for vegetation as well as mild to moderate aortic insufficiency. The patient was admitted and IV antibiotics resumed. Blood cultures remained negative. Transesophageal echocardiogram revealed low normal LV function and an aortic valve mass. 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 Patient was evaluated by Dr. Tyrone Sage and medical  therapy recommended. Most recent echocardiogram August 2021 showed ejection fraction 40 to 45%, previous tricuspid valve repair, bioprosthetic aortic valve with mild aortic insufficiency and mean gradient 18 mmHg.  Patient seen in the emergency room January 15 with chest pain.  Electrocardiogram shows sinus with ventricular pacing.  Troponins were normal.  Chest CT showed no pulmonary embolus but there is described a mosaic appearance of the lung parenchyma bilaterally.  Patient is concerned about this.  Since last seen, he complains of increased dyspnea on exertion and some dyspnea at rest.  No orthopnea, PND or pedal edema.  He does have chest pressure that occurs when he takes a deep breath.  He has some dizziness.  He denies syncope.   The patient does not have symptoms concerning for COVID-19 infection (fever, chills, cough, or new shortness of breath).    Past Medical History:  Diagnosis Date  . AKI (acute kidney injury) (HCC)   . Aortic valve vegetation on echo 04/29/14 04/29/2014   managed medically  . Brain abscess   . Congestive dilated cardiomyopathy (HCC) 06/23/2015  . Drug-induced leukopenia (HCC) 06/15/2014  . Endocarditis of aortic valve-November 2015   . Fracture of left femur (HCC)   . History of open heart surgery    December 30, 2013- ascending aortic root replacement, TEE  . Meningitis    Group B Strep (November 2015)  . Migraine headache 10/15/2019   since 2008  . Obesity   . Patent foramen ovale    Closed during procedure on December 30, 2013  . Presence of permanent cardiac pacemaker 01/04/2014  PPM [employer] FOR COMPLETE HEART BLOCK  . 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 S/P aortic root and valve allograft 12/30/2013   Human allograft aortic root replacement with repair of aorta-right atrial fistula and tricuspid valve repair  . S/P placement of cardiac pacemaker 01/04/2014   [employer] (serial number [id number]) dual chamber permanent pacemaker implanted  by Dr Ladona Ridgel  . S/P redo patent foramen ovale closure 02/12/2014  . S/P redo tricuspid valve repair 02/12/2014   Complex valvuloplasty including patch closure of VSD with plication of septal leaflet and 26 mm Edwards mc3 ring annuloplasty  . S/P ventricular septal defect repair + redo tricuspid valve repair + redo closure PFO 02/12/2014   Redo sternotomy for bovine pericardial patch repair of large ventricular septal defect (recurrent LVOT to RA fistula due to bacterial endocarditis)  . Smokeless tobacco use   . Third degree heart block (HCC) 12/31/2013  . Thrombocytopenia (HCC) 12/2013  . Tricuspid regurgitation 02/11/2014  . Ventricular septal defect 02/12/2014   Post-infectious s/p repair of LVOT to RA fistula for bacterial endocarditis   Past Surgical History:  Procedure Laterality Date  . ASCENDING AORTIC ROOT REPLACEMENT N/A 12/30/2013   Procedure: ASCENDING AORTIC ROOT REPLACEMENT with 34mm HOMOGRAFT, CLOSURE OF AORTIC TO RIGHT ATRIAL FISTULA, CLOSURE OF PATENT FORAMEN OVALE;  Surgeon: Delight Ovens, MD;  Location: [facility] OR;  Service: Open Heart Surgery;  Laterality: N/A;  . INSERT / REPLACE / REMOVE PACEMAKER  01/04/2014  . INTRAOPERATIVE TRANSESOPHAGEAL ECHOCARDIOGRAM N/A 12/30/2013   Procedure: INTRAOPERATIVE TRANSESOPHAGEAL ECHOCARDIOGRAM;  Surgeon: Delight Ovens, MD;  Location: [facility] OR;  Service: Open Heart Surgery;  Laterality: N/A;  . PACEMAKER INSERTION Left 04/06/2019   Procedure: PACEMAKER LEAD INSERTION;  Surgeon: Marinus Maw, MD;  Location: [facility] OR;  Service: Cardiovascular;  Laterality: Left;  . PACEMAKER LEAD REMOVAL N/A 04/06/2019   Procedure: PACEMAKER LEAD REMOVAL;  Surgeon: Marinus Maw, MD;  Location: [facility] OR;  Service: Cardiovascular;  Laterality: N/A;  . PERMANENT PACEMAKER INSERTION N/A 01/04/2014   Procedure: PERMANENT PACEMAKER INSERTION;  Surgeon: Marinus Maw, MD;  Location: [facility] CATH LAB;  Service: Cardiovascular;  Laterality: N/A;  . RIGHT HEART  CATHETERIZATION N/A 02/12/2014   Procedure: RIGHT HEART CATH;  Surgeon: Laurey Morale, MD;  Location: [facility] CATH LAB;  Service: Cardiovascular;  Laterality: N/A;  . TEE WITHOUT CARDIOVERSION N/A 02/12/2014   Procedure: TRANSESOPHAGEAL ECHOCARDIOGRAM (TEE);  Surgeon: Quintella Reichert, MD;  Location: [facility] ENDOSCOPY;  Service: Cardiovascular;  Laterality: N/A;  . TEE WITHOUT CARDIOVERSION N/A 04/30/2014   Procedure: TRANSESOPHAGEAL ECHOCARDIOGRAM (TEE);  Surgeon: Jake Bathe, MD;  Location: [facility] ENDOSCOPY;  Service: Cardiovascular;  Laterality: N/A;  . TRICUSPID VALVE REPLACEMENT N/A 12/30/2013   Procedure: REPAIR OF TRICUSPID VALVE LEAFLET;  Surgeon: Delight Ovens, MD;  Location: [facility] OR;  Service: Open Heart Surgery;  Laterality: N/A;  . TRICUSPID VALVE REPLACEMENT N/A 02/12/2014   Procedure: TRICUSPID VALVE REPAIR, REDO MEDIAN STERNOTOMY, REPAIR OF VENTRICULAR SEPTAL DEFECT (RECURRENT LV OUTFLOW TRACT TO RIGHT ATRIAL FISTULA), REDO CLOSURE OF PATENT FORAMEN OVALE;  Surgeon: Purcell Nails, MD;  Location: [facility] OR;  Service: Open Heart Surgery;  Laterality: N/A;  . VENOGRAM Left 04/06/2019   Procedure: Venogram;  Surgeon: Marinus Maw, MD;  Location: [facility] OR;  Service: Cardiovascular;  Laterality: Left;     Current Meds  Medication Sig  . acetaminophen (TYLENOL) 500 MG tablet Take 1,000 mg by mouth every 6 (six) hours as needed for moderate pain or headache.  Marland Kitchen  aspirin EC 81 MG tablet Take 81 mg by mouth daily.   Marland Kitchen aspirin-acetaminophen-caffeine (EXCEDRIN MIGRAINE) 250-250-65 MG tablet Take by mouth every 6 (six) hours as needed for headache.  . bismuth subsalicylate (PEPTO BISMOL) 262 MG/15ML suspension Take 30 mLs by mouth every 6 (six) hours as needed for indigestion or diarrhea or loose stools.     Allergies:   Cefepime and Vancomycin   Social History   Tobacco Use  . Smoking status: Never Smoker  . Smokeless tobacco: Former Engineer, water Use Topics  . Alcohol use: Yes    Alcohol/week: 0.0  standard drinks    Comment: socially  . Drug use: No     Family Hx: The patient's family history includes Heart murmur in his brother; Migraines in his brother and mother.  ROS:   Please see the history of present illness.    No Fever, chills  or productive cough All other systems reviewed and are negative.   Recent Labs: 02/27/2020: BUN 22; Creatinine, Ser 1.14; Hemoglobin 16.9; Platelets 211; Potassium 3.7; Sodium 139   Recent Lipid Panel Lab Results  Component Value Date/Time   TRIG 119 12/29/2013 10:00 PM    Wt Readings from Last 3 Encounters:  02/29/20 230 lb (104.3 kg)  02/27/20 240 lb (108.9 kg)  01/01/20 236 lb 9.6 oz (107.3 kg)     Objective:    Vital Signs:  BP (!) 152/98   Pulse 90   Ht 5\' 5"  (1.651 m)   Wt 230 lb (104.3 kg)   BMI 38.27 kg/m    VITAL SIGNS:  reviewed NAD Answers questions appropriately Normal affect Remainder of physical examination not performed (telehealth visit; coronavirus pandemic)  ASSESSMENT & PLAN:    1 previous aortic valve/aortic root replacement-continue SBE prophylaxis.  Patient is complaining of dyspnea.  We will repeat echocardiogram to reassess.  2 history of mild to moderate LV dysfunction-we will resume low-dose metoprolol at 25 mg nightly.  We will consider addition of an ARB in the future but he has complained of dizziness with standing which improved with stopping his beta-blocker in the past.  3 prior VSD repair/PFO closure and tricuspid valve repair-continue SBE prophylaxis.  4 prior pacemaker-Per electrophysiology.  5 dyspnea-chest CT recently showed no pulmonary embolus but there was a mosaic pattern and he has been referred to pulmonary.  We will also check echocardiogram to reassess LV function and aortic valve.   COVID-19 Education: The importance of social distancing was discussed today.  Time:   Today, I have spent 14 minutes with the patient with telehealth technology discussing the above problems.      Medication Adjustments/Labs and Tests Ordered: Current medicines are reviewed at length with the patient today.  Concerns regarding medicines are outlined above.   Tests Ordered: No orders of the defined types were placed in this encounter.   Medication Changes: No orders of the defined types were placed in this encounter.   Follow Up:  In Person in 1 year(s)  Signed, , MD  02/29/2020 1:50 PM    [facility]

## 2020-02-27 ENCOUNTER — Emergency Department (HOSPITAL_COMMUNITY)
Admission: EM | Admit: 2020-02-27 | Discharge: 2020-02-28 | Disposition: A | Discharge status: Home / Self Care | Payer: 59 | Attending: Emergency Medicine | Admitting: Emergency Medicine

## 2020-02-27 ENCOUNTER — Emergency Department (HOSPITAL_COMMUNITY): Payer: 59

## 2020-02-27 ENCOUNTER — Other Ambulatory Visit: Payer: Self-pay

## 2020-02-27 DIAGNOSIS — R072 Precordial pain: Secondary | ICD-10-CM

## 2020-02-27 DIAGNOSIS — Z950 Presence of cardiac pacemaker: Secondary | ICD-10-CM | POA: Insufficient documentation

## 2020-02-27 DIAGNOSIS — R079 Chest pain, unspecified: Secondary | ICD-10-CM | POA: Diagnosis present

## 2020-02-27 DIAGNOSIS — Z7982 Long term (current) use of aspirin: Secondary | ICD-10-CM | POA: Insufficient documentation

## 2020-02-27 LAB — BASIC METABOLIC PANEL
Anion gap: 9 (ref 5–15)
BUN: 22 mg/dL — ABNORMAL HIGH (ref 6–20)
CO2: 25 mmol/L (ref 22–32)
Calcium: 9.7 mg/dL (ref 8.9–10.3)
Chloride: 105 mmol/L (ref 98–111)
Creatinine, Ser: 1.14 mg/dL (ref 0.61–1.24)
GFR, Estimated: >60 mL/min (ref >60)
Glucose, Bld: 106 mg/dL — ABNORMAL HIGH (ref 70–99)
Potassium: 3.7 mmol/L (ref 3.5–5.1)
Sodium: 139 mmol/L (ref 135–145)

## 2020-02-27 LAB — TROPONIN I (HIGH SENSITIVITY): Troponin I (High Sensitivity): 4 ng/L (ref <18)

## 2020-02-27 LAB — CBC
HCT: 48.9 % (ref 39.0–52.0)
Hemoglobin: 16.9 g/dL (ref 13.0–17.0)
MCH: 30.1 pg (ref 26.0–34.0)
MCHC: 34.6 g/dL (ref 30.0–36.0)
MCV: 87.2 fL (ref 80.0–100.0)
Platelets: 211 10*3/uL (ref 150–400)
RBC: 5.61 MIL/uL (ref 4.22–5.81)
RDW: 12.4 % (ref 11.5–15.5)
WBC: 7.9 10*3/uL (ref 4.0–10.5)
nRBC: 0 % (ref 0.0–0.2)

## 2020-02-27 NOTE — ED Triage Notes (Addendum)
Pt presents to ED POV. Pt c/o L CP. Pt reports pain has started on 1/11 and radiates towards back. Pt reports that he also has SOB and dizziness upon standing. Pt report hx of endocarditis, valve replacements, and pacemaker. Pt sent interrogation from home to [employer] and had normal reading on 1/10

## 2020-02-28 ENCOUNTER — Emergency Department (HOSPITAL_COMMUNITY): Payer: 59

## 2020-02-28 ENCOUNTER — Encounter (HOSPITAL_COMMUNITY): Payer: Self-pay | Admitting: Emergency Medicine

## 2020-02-28 LAB — TROPONIN I (HIGH SENSITIVITY): Troponin I (High Sensitivity): 4 ng/L (ref <18)

## 2020-02-28 MED ORDER — ALUM & MAG HYDROXIDE-SIMETH 200-200-20 MG/5ML PO SUSP
30.0000 mL | Freq: Once | ORAL | Status: AC
  Administered 2020-02-28: 30 mL via ORAL
  Filled 2020-02-28: qty 30

## 2020-02-28 MED ORDER — IOHEXOL 350 MG/ML SOLN
75.0000 mL | Freq: Once | INTRAVENOUS | Status: AC | PRN
  Administered 2020-02-28: 75 mL via INTRAVENOUS

## 2020-02-28 NOTE — ED Notes (Signed)
Patient transported to CT 

## 2020-02-28 NOTE — ED Provider Notes (Addendum)
[facility] EMERGENCY DEPARTMENT Provider Note   CSN: [id number] Arrival date & time: 02/27/20  2050     History Chief Complaint  Patient presents with   Chest Pain    [name] is a 35 y.o. male.  The history is provided by the patient.  Chest Pain Pain location:  L chest Pain quality: aching   Pain radiates to:  Does not radiate Pain severity:  Moderate Onset quality:  Gradual Duration:  5 days Timing:  Constant Progression:  Waxing and waning Chronicity:  New Context: at rest   Context: not breathing   Relieved by:  Nothing Worsened by:  Nothing Ineffective treatments:  None tried Associated symptoms: no abdominal pain, no cough, no dizziness, no fever, no headache, no lower extremity edema, no numbness, no PND, no shortness of breath, no syncope, no vomiting and no weakness   Risk factors: no aortic disease   No f/c/r.  No lesions on the skin.  No arthralgias.  No covid symptoms.      Past Medical History:  Diagnosis Date   AKI (acute kidney injury) (HCC)    Aortic valve vegetation on echo 04/29/14 04/29/2014   managed medically   Brain abscess    Congestive dilated cardiomyopathy (HCC) 06/23/2015   Drug-induced leukopenia (HCC) 06/15/2014   Endocarditis of aortic valve-November 2015    Fracture of left femur (HCC)    History of open heart surgery    December 30, 2013- ascending aortic root replacement, TEE   Meningitis    Group B Strep (November 2015)   Migraine headache 10/15/2019   since 2008   Obesity    Patent foramen ovale    Closed during procedure on December 30, 2013   Presence of permanent cardiac pacemaker 01/04/2014   PPM MEDTRONIC FOR COMPLETE HEART BLOCK   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   S/P aortic root and valve allograft 12/30/2013   Human allograft aortic root replacement with repair of aorta-right atrial fistula and tricuspid valve repair   S/P placement of cardiac pacemaker 01/04/2014    Medtronic (serial number [id number]) dual chamber permanent pacemaker implanted by Dr Ladona Ridgel   S/P redo patent foramen ovale closure 02/12/2014   S/P redo tricuspid valve repair 02/12/2014   Complex valvuloplasty including patch closure of VSD with plication of septal leaflet and 26 mm Edwards mc3 ring annuloplasty   S/P ventricular septal defect repair + redo tricuspid valve repair + redo closure PFO 02/12/2014   Redo sternotomy for bovine pericardial patch repair of large ventricular septal defect (recurrent LVOT to RA fistula due to bacterial endocarditis)   Smokeless tobacco use    Third degree heart block (HCC) 12/31/2013   Thrombocytopenia (HCC) 12/2013   Tricuspid regurgitation 02/11/2014   Ventricular septal defect 02/12/2014   Post-infectious s/p repair of LVOT to RA fistula for bacterial endocarditis    Patient Active Problem List   Diagnosis Date Noted   Migraine headache 10/15/2019   Symptomatic bradycardia 04/05/2019   Congestive dilated cardiomyopathy (HCC) 06/23/2015   Pedal edema 05/12/2015   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   Drug-induced leukopenia (HCC) 06/15/2014   Endocarditis    Aortic valve vegetation on echo 04/29/14 04/29/2014   Lower back pain 03/10/2014   residual aortic insufficiency s/p allograft aortic root replacement 03/08/2014   Group B streptococcal infection    S/P ventricular septal defect repair + redo tricuspid valve repair + redo closure PFO 02/12/2014   S/P redo tricuspid valve repair 02/12/2014  S/P redo patent foramen ovale closure 02/12/2014   Dyspnea    Pacemaker- MDT Nov 2015 02/11/2014   Dyspnea on exertion 02/11/2014   S/P placement of cardiac pacemaker 01/04/2014   Third degree heart block (HCC) 12/31/2013   Endocarditis of aortic valve-November 2015    S/P aortic root and valve allograft Nov 2015with re do surgery Jan 1st 2016 12/30/2013   Bacterial endocarditis    Bilateral edema of lower  extremity    Sepsis due to group B Streptococcus (HCC)    LFT elevation    Headache    Pain in joint, lower leg    Dehydration     Past Surgical History:  Procedure Laterality Date   ASCENDING AORTIC ROOT REPLACEMENT N/A 12/30/2013   Procedure: ASCENDING AORTIC ROOT REPLACEMENT with 75mm HOMOGRAFT, CLOSURE OF AORTIC TO RIGHT ATRIAL FISTULA, CLOSURE OF PATENT FORAMEN OVALE;  Surgeon: Delight Ovens, MD;  Location: [facility] OR;  Service: Open Heart Surgery;  Laterality: N/A;   INSERT / REPLACE / REMOVE PACEMAKER  01/04/2014   INTRAOPERATIVE TRANSESOPHAGEAL ECHOCARDIOGRAM N/A 12/30/2013   Procedure: INTRAOPERATIVE TRANSESOPHAGEAL ECHOCARDIOGRAM;  Surgeon: Delight Ovens, MD;  Location: [facility] OR;  Service: Open Heart Surgery;  Laterality: N/A;   PACEMAKER INSERTION Left 04/06/2019   Procedure: PACEMAKER LEAD INSERTION;  Surgeon: Marinus Maw, MD;  Location: [facility] OR;  Service: Cardiovascular;  Laterality: Left;   PACEMAKER LEAD REMOVAL N/A 04/06/2019   Procedure: PACEMAKER LEAD REMOVAL;  Surgeon: Marinus Maw, MD;  Location: [facility] OR;  Service: Cardiovascular;  Laterality: N/A;   PERMANENT PACEMAKER INSERTION N/A 01/04/2014   Procedure: PERMANENT PACEMAKER INSERTION;  Surgeon: Marinus Maw, MD;  Location: [facility] CATH LAB;  Service: Cardiovascular;  Laterality: N/A;   RIGHT HEART CATHETERIZATION N/A 02/12/2014   Procedure: RIGHT HEART CATH;  Surgeon: Laurey Morale, MD;  Location: [facility] CATH LAB;  Service: Cardiovascular;  Laterality: N/A;   TEE WITHOUT CARDIOVERSION N/A 02/12/2014   Procedure: TRANSESOPHAGEAL ECHOCARDIOGRAM (TEE);  Surgeon: Quintella Reichert, MD;  Location: [facility] ENDOSCOPY;  Service: Cardiovascular;  Laterality: N/A;   TEE WITHOUT CARDIOVERSION N/A 04/30/2014   Procedure: TRANSESOPHAGEAL ECHOCARDIOGRAM (TEE);  Surgeon: Jake Bathe, MD;  Location: [facility] ENDOSCOPY;  Service: Cardiovascular;  Laterality: N/A;   TRICUSPID VALVE REPLACEMENT N/A 12/30/2013   Procedure: REPAIR OF  TRICUSPID VALVE LEAFLET;  Surgeon: Delight Ovens, MD;  Location: [facility] OR;  Service: Open Heart Surgery;  Laterality: N/A;   TRICUSPID VALVE REPLACEMENT N/A 02/12/2014   Procedure: TRICUSPID VALVE REPAIR, REDO MEDIAN STERNOTOMY, REPAIR OF VENTRICULAR SEPTAL DEFECT (RECURRENT LV OUTFLOW TRACT TO RIGHT ATRIAL FISTULA), REDO CLOSURE OF PATENT FORAMEN OVALE;  Surgeon: Purcell Nails, MD;  Location: [facility] OR;  Service: Open Heart Surgery;  Laterality: N/A;   VENOGRAM Left 04/06/2019   Procedure: Venogram;  Surgeon: Marinus Maw, MD;  Location: [facility] OR;  Service: Cardiovascular;  Laterality: Left;       Family History  Problem Relation Age of Onset   Migraines Mother    Migraines Brother    Heart murmur Brother     Social History   Tobacco Use   Smoking status: Never Smoker   Smokeless tobacco: Former Neurosurgeon  Substance Use Topics   Alcohol use: Yes    Alcohol/week: 0.0 standard drinks    Comment: socially   Drug use: No    Home Medications Prior to Admission medications   Medication Sig Start Date End Date Taking? Authorizing Provider  acetaminophen (TYLENOL) 500 MG tablet Take  1,000 mg by mouth every 6 (six) hours as needed for moderate pain or headache.    [provider]  aspirin EC 81 MG tablet Take 81 mg by mouth daily.     [provider]  aspirin-acetaminophen-caffeine (EXCEDRIN MIGRAINE) [id number] MG tablet Take by mouth every 6 (six) hours as needed for headache.    [provider]  bismuth subsalicylate (PEPTO BISMOL) 262 MG/15ML suspension Take 30 mLs by mouth every 6 (six) hours as needed for indigestion or diarrhea or loose stools.    [provider]    Allergies    Cefepime and Vancomycin  Review of Systems   Review of Systems  Constitutional: Negative for fever.  HENT: Negative for drooling.   Eyes: Negative for visual disturbance.  Respiratory: Negative for cough and shortness of breath.   Cardiovascular: Positive for  chest pain. Negative for leg swelling, syncope and PND.  Gastrointestinal: Negative for abdominal pain and vomiting.  Musculoskeletal: Negative for arthralgias and neck pain.  Skin: Negative for rash and wound.  Neurological: Negative for dizziness, seizures, facial asymmetry, speech difficulty, weakness, numbness and headaches.  Psychiatric/Behavioral: Negative for agitation.  All other systems reviewed and are negative.   Physical Exam Updated Vital Signs BP (!) 121/97 (BP Location: Right Arm)    Pulse 85    Temp 97.7 F (36.5 C) (Oral)    Resp 12    Ht 5\' 5"  (1.651 m)    Wt 108.9 kg    SpO2 100%    BMI 39.94 kg/m   Physical Exam Vitals and nursing note reviewed.  Constitutional:      General: He is not in acute distress.    Appearance: Normal appearance. He is not diaphoretic.  HENT:     Head: Normocephalic and atraumatic.     Nose: Nose normal.  Eyes:     Conjunctiva/sclera: Conjunctivae normal.     Pupils: Pupils are equal, round, and reactive to light.  Cardiovascular:     Rate and Rhythm: Normal rate and regular rhythm.     Pulses: Normal pulses.     Heart sounds: Normal heart sounds. No murmur heard.   Pulmonary:     Effort: Pulmonary effort is normal.     Breath sounds: Normal breath sounds.  Abdominal:     General: Abdomen is flat. Bowel sounds are normal.     Tenderness: There is no guarding.  Musculoskeletal:        General: Normal range of motion.     Cervical back: Normal range of motion and neck supple.     Right lower leg: No edema.     Left lower leg: No edema.  Skin:    General: Skin is warm and dry.     Capillary Refill: Capillary refill takes less than 2 seconds.     Comments: No osler nodes no janeway lesions   Neurological:     General: No focal deficit present.     Mental Status: He is alert and oriented to person, place, and time.  Psychiatric:        Mood and Affect: Mood normal.        Behavior: Behavior normal.     ED Results /  Procedures / Treatments   Labs (all labs ordered are listed, but only abnormal results are displayed) Results for orders placed or performed during the hospital encounter of 02/27/20  Basic metabolic panel  Result Value Ref Range   Sodium 139 135 - 145 mmol/L  Potassium 3.7 3.5 - 5.1 mmol/L   Chloride 105 98 - 111 mmol/L   CO2 25 22 - 32 mmol/L   Glucose, Bld 106 (H) 70 - 99 mg/dL   BUN 22 (H) 6 - 20 mg/dL   Creatinine, Ser 8.63 0.61 - 1.24 mg/dL   Calcium 9.7 8.9 - 81.7 mg/dL   GFR, Estimated >71 >16 mL/min   Anion gap 9 5 - 15  CBC  Result Value Ref Range   WBC 7.9 4.0 - 10.5 K/uL   RBC 5.61 4.22 - 5.81 MIL/uL   Hemoglobin 16.9 13.0 - 17.0 g/dL   HCT 57.9 03.8 - 33.3 %   MCV 87.2 80.0 - 100.0 fL   MCH 30.1 26.0 - 34.0 pg   MCHC 34.6 30.0 - 36.0 g/dL   RDW 83.2 91.9 - 16.6 %   Platelets 211 150 - 400 K/uL   nRBC 0.0 0.0 - 0.2 %  Troponin I (High Sensitivity)  Result Value Ref Range   Troponin I (High Sensitivity) 4 <18 ng/L   DG Chest 2 View  Result Date: 02/27/2020 CLINICAL DATA:  Chest pain with shortness of breath EXAM: CHEST - 2 VIEW COMPARISON:  None. FINDINGS: The heart size and mediastinal contours are within normal limits. A left-sided pacemaker seen with the lead tips in the right atrium right ventricle. Overlying median sternotomy wires are present. Both lungs are clear. The visualized skeletal structures are unremarkable. IMPRESSION: No active cardiopulmonary disease. Electronically Signed   By: Jonna Clark M.D.   On: 02/27/2020 21:47    EKG EKG Interpretation  Date/Time:  Saturday February 27 2020 21:06:57 EST Ventricular Rate:  83 PR Interval:  160 QRS Duration: 176 QT Interval:  412 QTC Calculation: 484 R Axis:   -81 Text Interpretation: Atrial-sensed ventricular-paced rhythm Confirmed by Montine Hight ([id number]) on 02/27/2020 11:56:40 PM   Radiology DG Chest 2 View  Result Date: 02/27/2020 CLINICAL DATA:  Chest pain with shortness of breath EXAM:  CHEST - 2 VIEW COMPARISON:  None. FINDINGS: The heart size and mediastinal contours are within normal limits. A left-sided pacemaker seen with the lead tips in the right atrium right ventricle. Overlying median sternotomy wires are present. Both lungs are clear. The visualized skeletal structures are unremarkable. IMPRESSION: No active cardiopulmonary disease. Electronically Signed   By: Jonna Clark M.D.   On: 02/27/2020 21:47    Procedures Procedures (including critical care time)  Medications Ordered in ED Medications  alum & mag hydroxide-simeth (MAALOX/MYLANTA) 200-200-20 MG/5ML suspension 30 mL (has no administration in time range)    ED Course  I have reviewed the triage vital signs and the nursing notes.  Pertinent labs & imaging results that were available during my care of the patient were reviewed by me and considered in my medical decision making (see chart for details).   Ruled out for MI in the ED.  Ruled out for PE in the ED. No PNA on CT.  I do not believe this is endocarditis.  There are no stigmata: no murmur, no osler nodes no Janeway lesions, normal vitals and normal white couny.  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        [name] was evaluated in Emergency Department on 02/28/2020 for the symptoms described in the history of present illness. He was evaluated in the context of the global COVID-19 pandemic, which necessitated consideration that the patient might be  at risk for infection with the SARS-CoV-2 virus that causes COVID-19. Institutional protocols and algorithms that pertain to the evaluation of patients at risk for COVID-19 are in a state of rapid change based on information released by regulatory bodies including the CDC and federal and state organizations. These policies and algorithms were followed during the patient's care in the ED.  Final Clinical Impression(s) / ED  Diagnoses Return for intractable cough, coughing up blood,fevers >100.4 unrelieved by medication, shortness of breath, intractable vomiting, chest pain, shortness of breath, weakness,numbness, changes in speech, facial asymmetry,abdominal pain, passing out,Inability to tolerate liquids or food, cough, altered mental status or any concerns. No signs of systemic illness or infection. The patient is nontoxic-appearing on exam and vital signs are within normal limits.   I have reviewed the triage vital signs and the nursing notes. Pertinent labs &imaging results that were available during my care of the patient were reviewed by me and considered in my medical decision making (see chart for details).After history, exam, and medical workup I feel the patient has beenappropriately medically screened and is safe for discharge home. Pertinent diagnoses were discussed with the patient. Patient was given return precautions.    Albert Daigle, MD 02/28/20 [id number]    Cy Blamer, MD 02/28/20 [id number]

## 2020-02-28 NOTE — ED Notes (Signed)
Pt back to room at this time

## 2020-02-29 ENCOUNTER — Encounter: Payer: Self-pay | Admitting: Cardiology

## 2020-02-29 ENCOUNTER — Telehealth (INDEPENDENT_AMBULATORY_CARE_PROVIDER_SITE_OTHER): Payer: 59 | Admitting: Cardiology

## 2020-02-29 VITALS — BP 152/98 | HR 90 | Ht 65.0 in | Wt 230.0 lb

## 2020-02-29 DIAGNOSIS — I428 Other cardiomyopathies: Secondary | ICD-10-CM | POA: Diagnosis not present

## 2020-02-29 DIAGNOSIS — R0600 Dyspnea, unspecified: Secondary | ICD-10-CM | POA: Diagnosis not present

## 2020-02-29 DIAGNOSIS — Z952 Presence of prosthetic heart valve: Secondary | ICD-10-CM

## 2020-02-29 DIAGNOSIS — Z950 Presence of cardiac pacemaker: Secondary | ICD-10-CM | POA: Diagnosis not present

## 2020-02-29 DIAGNOSIS — R0609 Other forms of dyspnea: Secondary | ICD-10-CM

## 2020-02-29 NOTE — Patient Instructions (Signed)
Testing/Procedures:  Your physician has requested that you have an echocardiogram. Echocardiography is a painless test that uses sound waves to create images of your heart. It provides your doctor with information about the size and shape of your heart and how well your heart's chambers and valves are working. This procedure takes approximately one hour. There are no restrictions for this procedure.  [address]   Follow-Up: At [facility], you and your health needs are our priority.  As part of our continuing mission to provide you with exceptional heart care, we have created designated Provider Care Teams.  These Care Teams include your primary Cardiologist (physician) and Advanced Practice Providers (APPs -  Physician Assistants and Nurse Practitioners) who all work together to provide you with the care you need, when you need it.  We recommend signing up for the patient portal called "MyChart".  Sign up information is provided on this After Visit Summary.  MyChart is used to connect with patients for Virtual Visits (Telemedicine).  Patients are able to view lab/test results, encounter notes, upcoming appointments, etc.  Non-urgent messages can be sent to your provider as well.   To learn more about what you can do with MyChart, go to [email or web address].    Your next appointment:   6 week(s)  The format for your next appointment:   In Person  Provider:   You will see one of the following Advanced Practice Providers on your designated Care Team:    Corine Shelter, PA-C  [place], [place]  Edd Fabian, [place]  Then, Olga Millers, MD will plan to see you again in  4 month(s).

## 2020-02-29 NOTE — Addendum Note (Signed)
Addended by: Freddi Starr on: 02/29/2020 02:18 PM   Modules accepted: Orders

## 2020-03-01 ENCOUNTER — Telehealth (INDEPENDENT_AMBULATORY_CARE_PROVIDER_SITE_OTHER): Payer: 59 | Admitting: Internal Medicine

## 2020-03-01 ENCOUNTER — Other Ambulatory Visit: Payer: Self-pay

## 2020-03-01 VITALS — BP 124/77 | HR 69 | Ht 65.0 in | Wt 240.0 lb

## 2020-03-01 DIAGNOSIS — R0609 Other forms of dyspnea: Secondary | ICD-10-CM

## 2020-03-01 DIAGNOSIS — R0600 Dyspnea, unspecified: Secondary | ICD-10-CM | POA: Diagnosis not present

## 2020-03-01 NOTE — Progress Notes (Signed)
Electrophysiology TeleHealth Note   Due to national recommendations of social distancing due to COVID 19, an audio/video telehealth visit is felt to be most appropriate for this patient at this time.  See MyChart message from today for the patient's consent to telehealth for [name].   Date:  03/01/2020   ID:  [name], DOB 08-02-1985, MRN [id number]  Location: patient's home  Provider location: [address] [place]  Evaluation Performed: Follow-up visit  PCP:  [name], MD  Cardiologist:  Olga Millers, MD  Electrophysiologist:  Dr Ladona Ridgel  Chief Complaint:  "I have been a little short of breath."  History of Present Illness:    [name] is a 35 y.o. male who presents via audio/video conferencing for a telehealth visit today.  Since last being seen in our clinic, the patient reports dyspnea with exertion. He admits to being sedentary. He notes at times that when he exercises he will get sob and winded. He denies fever or chills. He has an extensive past medical history as outlined in Dr. Waunita Schooner note from yesterday. He denies cough or hemoptisis.  Past Medical History:  Diagnosis Date  . AKI (acute kidney injury) (HCC)   . Aortic valve vegetation on echo 04/29/14 04/29/2014   managed medically  . Brain abscess   . Congestive dilated cardiomyopathy (HCC) 06/23/2015  . Drug-induced leukopenia (HCC) 06/15/2014  . Endocarditis of aortic valve-November 2015   . Fracture of left femur (HCC)   . History of open heart surgery    December 30, 2013- ascending aortic root replacement, TEE  . Meningitis    Group B Strep (November 2015)  . Migraine headache 10/15/2019   since 2008  . Obesity   . Patent foramen ovale    Closed during procedure on December 30, 2013  . Presence of permanent cardiac pacemaker 01/04/2014   PPM MEDTRONIC FOR COMPLETE HEART BLOCK  . 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 S/P aortic root and valve allograft  12/30/2013   Human allograft aortic root replacement with repair of aorta-right atrial fistula and tricuspid valve repair  . S/P placement of cardiac pacemaker 01/04/2014   Medtronic (serial number [id number]) dual chamber permanent pacemaker implanted by Dr Ladona Ridgel  . S/P redo patent foramen ovale closure 02/12/2014  . S/P redo tricuspid valve repair 02/12/2014   Complex valvuloplasty including patch closure of VSD with plication of septal leaflet and 26 mm Edwards mc3 ring annuloplasty  . S/P ventricular septal defect repair + redo tricuspid valve repair + redo closure PFO 02/12/2014   Redo sternotomy for bovine pericardial patch repair of large ventricular septal defect (recurrent LVOT to RA fistula due to bacterial endocarditis)  . Smokeless tobacco use   . Third degree heart block (HCC) 12/31/2013  . Thrombocytopenia (HCC) 12/2013  . Tricuspid regurgitation 02/11/2014  . Ventricular septal defect 02/12/2014   Post-infectious s/p repair of LVOT to RA fistula for bacterial endocarditis    Past Surgical History:  Procedure Laterality Date  . ASCENDING AORTIC ROOT REPLACEMENT N/A 12/30/2013   Procedure: ASCENDING AORTIC ROOT REPLACEMENT with 35mm HOMOGRAFT, CLOSURE OF AORTIC TO RIGHT ATRIAL FISTULA, CLOSURE OF PATENT FORAMEN OVALE;  Surgeon: Delight Ovens, MD;  Location: [facility] OR;  Service: Open Heart Surgery;  Laterality: N/A;  . INSERT / REPLACE / REMOVE PACEMAKER  01/04/2014  . INTRAOPERATIVE TRANSESOPHAGEAL ECHOCARDIOGRAM N/A 12/30/2013   Procedure: INTRAOPERATIVE TRANSESOPHAGEAL ECHOCARDIOGRAM;  Surgeon: Delight Ovens, MD;  Location: [facility] OR;  Service:  Open Heart Surgery;  Laterality: N/A;  . PACEMAKER INSERTION Left 04/06/2019   Procedure: PACEMAKER LEAD INSERTION;  Surgeon: Marinus Maw, MD;  Location: [facility] OR;  Service: Cardiovascular;  Laterality: Left;  . PACEMAKER LEAD REMOVAL N/A 04/06/2019   Procedure: PACEMAKER LEAD REMOVAL;  Surgeon: Marinus Maw, MD;  Location: [facility] OR;   Service: Cardiovascular;  Laterality: N/A;  . PERMANENT PACEMAKER INSERTION N/A 01/04/2014   Procedure: PERMANENT PACEMAKER INSERTION;  Surgeon: Marinus Maw, MD;  Location: [facility] CATH LAB;  Service: Cardiovascular;  Laterality: N/A;  . RIGHT HEART CATHETERIZATION N/A 02/12/2014   Procedure: RIGHT HEART CATH;  Surgeon: Laurey Morale, MD;  Location: [facility] CATH LAB;  Service: Cardiovascular;  Laterality: N/A;  . TEE WITHOUT CARDIOVERSION N/A 02/12/2014   Procedure: TRANSESOPHAGEAL ECHOCARDIOGRAM (TEE);  Surgeon: Quintella Reichert, MD;  Location: [facility] ENDOSCOPY;  Service: Cardiovascular;  Laterality: N/A;  . TEE WITHOUT CARDIOVERSION N/A 04/30/2014   Procedure: TRANSESOPHAGEAL ECHOCARDIOGRAM (TEE);  Surgeon: Jake Bathe, MD;  Location: [facility] ENDOSCOPY;  Service: Cardiovascular;  Laterality: N/A;  . TRICUSPID VALVE REPLACEMENT N/A 12/30/2013   Procedure: REPAIR OF TRICUSPID VALVE LEAFLET;  Surgeon: Delight Ovens, MD;  Location: [facility] OR;  Service: Open Heart Surgery;  Laterality: N/A;  . TRICUSPID VALVE REPLACEMENT N/A 02/12/2014   Procedure: TRICUSPID VALVE REPAIR, REDO MEDIAN STERNOTOMY, REPAIR OF VENTRICULAR SEPTAL DEFECT (RECURRENT LV OUTFLOW TRACT TO RIGHT ATRIAL FISTULA), REDO CLOSURE OF PATENT FORAMEN OVALE;  Surgeon: Purcell Nails, MD;  Location: [facility] OR;  Service: Open Heart Surgery;  Laterality: N/A;  . VENOGRAM Left 04/06/2019   Procedure: Venogram;  Surgeon: Marinus Maw, MD;  Location: [facility] OR;  Service: Cardiovascular;  Laterality: Left;    Current Outpatient Medications  Medication Sig Dispense Refill  . acetaminophen (TYLENOL) 500 MG tablet Take 1,000 mg by mouth every 6 (six) hours as needed for moderate pain or headache.    Marland Kitchen aspirin EC 81 MG tablet Take 81 mg by mouth daily.     Marland Kitchen aspirin-acetaminophen-caffeine (EXCEDRIN MIGRAINE) 250-250-65 MG tablet Take by mouth every 6 (six) hours as needed for headache.    . bismuth subsalicylate (PEPTO BISMOL) 262 MG/15ML suspension Take 30 mLs by mouth  every 6 (six) hours as needed for indigestion or diarrhea or loose stools.    . Metoprolol Succinate 25 MG CS24 Take 1 tablet by mouth daily.     No current facility-administered medications for this visit.    Allergies:   Cefepime and Vancomycin   Social History:  The patient  reports that he has never smoked. He quit smokeless tobacco use about 6 years ago. He reports current alcohol use. He reports that he does not use drugs.   Family History:  The patient's  family history includes Heart murmur in his brother; Migraines in his brother and mother.   ROS:  Please see the history of present illness.   All other systems are personally reviewed and negative.    Exam:    Vital Signs:  BP 124/77   Pulse 69   Ht 5\' 5"  (1.651 m)   Wt 240 lb (108.9 kg)   BMI 39.94 kg/m   Well appearing, alert and conversant, regular work of breathing,  good skin color Eyes- anicteric, neuro- grossly intact, skin- no apparent rash or lesions or cyanosis, mouth- oral mucosa is pink   Labs/Other Tests and Data Reviewed:    Recent Labs: 02/27/2020: BUN 22; Creatinine, Ser 1.14; Hemoglobin 16.9; Platelets 211; Potassium 3.7;  Sodium 139   Wt Readings from Last 3 Encounters:  03/01/20 240 lb (108.9 kg)  02/29/20 230 lb (104.3 kg)  02/27/20 240 lb (108.9 kg)     Other studies personally reviewed: Additional studies/ records that were reviewed today include:   Last device remote is reviewed from [employer] PDF dated 11/21 which reveals normal device function, no arrhythmias    ASSESSMENT & PLAN:    1.  CHB - he is asymptomatic, s/p PPM insertion. 2. Dyspnea - the etiology is unclear. He will undergo 2D echo and PFT's. 3. PPM -his medtronic DDD PM is working normally. We will follow remote visits every 3 months. 4. VSD - he is s/p repair. I wonder if his dyspnea could be related.   Follow-up:  6 months Next remote: 2/22  Current medicines are reviewed at length with the patient today.   The  patient does not have concerns regarding his medicines.  The following changes were made today:  none  Labs/ tests ordered today include: none No orders of the defined types were placed in this encounter.    Patient Risk:  after full review of this patients clinical status, I feel that they are at moderate risk at this time.  Today, I have spent 20 minutes with the patient with telehealth technology discussing all of the above. Kerney Elbe, MD  03/01/2020 11:43 AM     [facility] [address] [place] [address] [phone] (office) [phone] (fax)

## 2020-03-02 ENCOUNTER — Ambulatory Visit (INDEPENDENT_AMBULATORY_CARE_PROVIDER_SITE_OTHER): Payer: 59 | Admitting: Infectious Disease

## 2020-03-02 ENCOUNTER — Encounter: Payer: Self-pay | Admitting: Infectious Disease

## 2020-03-02 ENCOUNTER — Telehealth: Payer: Self-pay | Admitting: Diagnostic Neuroimaging

## 2020-03-02 ENCOUNTER — Other Ambulatory Visit: Payer: Self-pay

## 2020-03-02 VITALS — BP 104/73 | HR 59 | Temp 97.6°F | Ht 65.0 in | Wt 232.0 lb

## 2020-03-02 DIAGNOSIS — G009 Bacterial meningitis, unspecified: Secondary | ICD-10-CM

## 2020-03-02 DIAGNOSIS — R0600 Dyspnea, unspecified: Secondary | ICD-10-CM

## 2020-03-02 DIAGNOSIS — Z954 Presence of other heart-valve replacement: Secondary | ICD-10-CM

## 2020-03-02 DIAGNOSIS — Z955 Presence of coronary angioplasty implant and graft: Secondary | ICD-10-CM

## 2020-03-02 DIAGNOSIS — R42 Dizziness and giddiness: Secondary | ICD-10-CM | POA: Diagnosis not present

## 2020-03-02 DIAGNOSIS — I330 Acute and subacute infective endocarditis: Secondary | ICD-10-CM

## 2020-03-02 DIAGNOSIS — R079 Chest pain, unspecified: Secondary | ICD-10-CM

## 2020-03-02 DIAGNOSIS — Z9889 Other specified postprocedural states: Secondary | ICD-10-CM

## 2020-03-02 DIAGNOSIS — Z8774 Personal history of (corrected) congenital malformations of heart and circulatory system: Secondary | ICD-10-CM

## 2020-03-02 DIAGNOSIS — D702 Other drug-induced agranulocytosis: Secondary | ICD-10-CM

## 2020-03-02 DIAGNOSIS — R0609 Other forms of dyspnea: Secondary | ICD-10-CM

## 2020-03-02 DIAGNOSIS — Z8616 Personal history of COVID-19: Secondary | ICD-10-CM

## 2020-03-02 DIAGNOSIS — R519 Headache, unspecified: Secondary | ICD-10-CM | POA: Diagnosis not present

## 2020-03-02 DIAGNOSIS — I38 Endocarditis, valve unspecified: Secondary | ICD-10-CM

## 2020-03-02 DIAGNOSIS — A491 Streptococcal infection, unspecified site: Secondary | ICD-10-CM

## 2020-03-02 DIAGNOSIS — R0602 Shortness of breath: Secondary | ICD-10-CM | POA: Diagnosis not present

## 2020-03-02 DIAGNOSIS — G002 Streptococcal meningitis: Secondary | ICD-10-CM

## 2020-03-02 DIAGNOSIS — G43409 Hemiplegic migraine, not intractable, without status migrainosus: Secondary | ICD-10-CM

## 2020-03-02 DIAGNOSIS — Z950 Presence of cardiac pacemaker: Secondary | ICD-10-CM

## 2020-03-02 DIAGNOSIS — G060 Intracranial abscess and granuloma: Secondary | ICD-10-CM

## 2020-03-02 DIAGNOSIS — T826XXD Infection and inflammatory reaction due to cardiac valve prosthesis, subsequent encounter: Secondary | ICD-10-CM

## 2020-03-02 HISTORY — DX: Chest pain, unspecified: R07.9

## 2020-03-02 HISTORY — DX: Dizziness and giddiness: R42

## 2020-03-02 NOTE — Telephone Encounter (Signed)
Pt called stating that he is experiencing lightheadedness, dizziness and short of breath with no headache this time and he is wanting to discuss with RN to see what he can do. Please advise.

## 2020-03-02 NOTE — Telephone Encounter (Signed)
Called pt. He previously saw Dr. Marjory Lies for dizziness, being light headed, vision changes, headaches. Was told to follow up as needed as work up unremarkable. Sx restarted a couple weekends ago. Does not have headache this time but all other sx present. Went to ER this past weekend. Nurse told him labs and heart okay. However, he was told he may have chronic lung disease which would be new. Has appt with cardiologist soon and pulmonologist on 03/16/20. Current sx constant while standing and intermittent when sitting/laying. He wants to know what Dr. Marjory Lies would recommend. Advised I will send message to MD and call back once he responds.

## 2020-03-02 NOTE — Progress Notes (Signed)
Subjective:  Chief complaint dyspnea on exertion with lightheadedness   Patient ID: [name], male    DOB: March 05, 1985, 35 y.o.   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 Valve sent to [facility] showed GBS by ribosomal 16S sequencing.   He has had trouble tolerating the high dose ceftriaxone and also completed 2 weeks of gentamicin. He was on Rocephin when I saw him on 02/02/14 and getting used to the abx but a few days later was found to have worsening dyspnea, heart failure change in heart sounds and was admitted and found  To have new Ventricular Septal Defect (recurrent LV outflow tract to right atrial fistula, Severe Tricuspid Regurgitatio, Recurrent Patent Foramen Ovale, and Acute Congestive Heart Failure.   He underwent :   Redo Median Sternotomy  Repair of Ventricular Septal Defect Double layer bovine pericardial patch closure  Tricuspid Valve Repair Complex Valvuloplasty including plication of septal leaflet Edwards mc3 Ring annuloplasty  On January 1st, 2016. Tissue was sent off to [facility] where PCR of Ribosome AGAIN WAS POSITIVE FOR GROUP B STREPTOCOCCUS.      NOTE Pacemaker from first CT surgery had remained in place through 2nd CT surgery and he is Pacer dependent.   IN the interim he was treated withn Rocephin high dose along with 2 weeks of gentamicin which he finished).  His repeat Echo showed stability of his postoperative repairs.  He was then on oral amoxicillin and without any symptoms. His fiance was resting her head on his chest and discovered a new murmur.  He was evaluated and  had 2d echo showing mobile vegetation measuring 1.3 x 1.7 cm seemingly attached to the bioprosthetic aortic valve,with mild to moderate AI on 04/29/14.  He was brought into the hospital and underwent TEE which read as showing:  The same large (1.5 x 1.1cm) mobile irregular bordered echodensity along the anterior aspect of the subvalvular apparatus of the bioprosthetic aortic valve in the left ventricular outflow tract consistent with vegetation. There is perivalvular abscess like pocket/space in the posterior region of aortic valve adjacent to intraatrial septum. There was mild regurgitation.  He had blood cultures taken on amoxicillin which were negative and he was broadened ultimately to vancomycin and cefepime.   He was followed closely by Cardiology and CT surgery.   Dr. Tyrone Sage reviewed the TEE and noted that the Lesion was in the outflow tract of the left ventricle and not on the aortic homograph leaflets. He did not recommend repeat 3rd open heart surgery.  Patient had been dc to home on IV vancomycin and cefepime.   We found that he then developed leukopenia, vancomycin was stopped, but it did not quickly resolve. So we stopped cefepime as well. He was out of town so we bridged him with high dose oral levaquin 750mg  daily until he could get back to [facility] and he was switched to IV daptomycin which he has been on through his 7 week of his most recent round of abx therapy. TTE done in mid April 2106 shows that his vegetation has shrunk in size.  We then switched him to high dose oral levaquin 750mg  daily  Since then repeat TTE done in  [id number] read by Dr. Jens Som and Dr. Tyrone Sage and found to no longer have evidence of vegetation.  Another TTE done in November that did not show any evidence for vegeation  He has been on levaquin. I had not seen him since 2017 before seeing him again in April of 2019.  He had remained on levaquin since then.  I was  concerned throughout this time period that PM could be a nidus of infection and have been reluctant to take him off of antibiotics though I had wished to change him to amoxicillin from levaquin to see how he would do on this with blood work.  He called in for refill of levaquin and we scheduled an e visit in June of 2020.   Since I last saw him several things have happened.  He had gained weight and been instructed to lose weight and begun exercising including lifting weights.  He had also stopped his levaquin in January feeling that he might no longer need it (as we had previously discussed)  In the interim he was admitted with CHB with asystolic moments in February of 2021 which turned out to have been due to fractured leads from his pacemaker. He had blood cultures done on that admission which were without growth. Intraoperative echo without any vegetations identified. No evidence of infection per Dr. Lubertha Basque note with exploration of generator pocket; dense fibrous scar tissue around leads. Fractured RV lead was removed   Rexene Alberts was called by Cardiology and recommended patient start amoxicillin 500 mg q 12.   He saw Dr Lisabeth Devoid from Cardiology who obtained blood cultures again which have no growth so far  Repeat TTE in late August of 2021 appears stable.  Patient started amoxicillin after this.  Of note he tells me that prior to his diagnosis of endocarditis with septic emboli to the brain and meningitis he suffered from frequent migrainous symptoms in which one side of his face would go numb he might have blurry vision and also headache.  After his infection was diagnosed and treated surgically and medically this migraines disappeared.  However this fall he developed having blurry vision dizziness headaches and malaise.  Symptoms started seem to be getting better once he was started back on his amoxicillin.  Able to get a CT of the brain with contrast that showed no  evidence of infection.  Note MRI was not possible due to his mechanical valve.  Lumbar puncture was done off antibiotics and was unrevealing.  Blood cultures both been sterile off antibiotics and he stayed off antibiotics since then.  He does continue however to have problems with dyspnea on exertion that is followed by lightheadedness and dizziness.  He was recently seen in the ER where CT angiogram was performed that showed no evidence of pulmonary embolism but showed some "mosaic pattern to his lung parenchyma.  Various interpretations were given to this by radiology though not certain that he would fit with any of these.  Is seen Dr. Ladona Ridgel yesterday and has been scheduled for repeat echocardiogram as well as a formal evaluation with pulmonary.  He is also been seen again by [facility].   Past Medical History:  Diagnosis Date  . AKI (acute kidney injury) (HCC)   . Aortic valve vegetation on echo 04/29/14 04/29/2014   managed medically  . Brain abscess   . Congestive dilated cardiomyopathy (HCC) 06/23/2015  . Drug-induced leukopenia (HCC) 06/15/2014  . Endocarditis of aortic valve-November 2015   . Fracture of  left femur (HCC)   . History of open heart surgery    December 30, 2013- ascending aortic root replacement, TEE  . Meningitis    Group B Strep (November 2015)  . Migraine headache 10/15/2019   since 2008  . Obesity   . Patent foramen ovale    Closed during procedure on December 30, 2013  . Presence of permanent cardiac pacemaker 01/04/2014   PPM MEDTRONIC FOR COMPLETE HEART BLOCK  . 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 S/P aortic root and valve allograft 12/30/2013   Human allograft aortic root replacement with repair of aorta-right atrial fistula and tricuspid valve repair  . S/P placement of cardiac pacemaker 01/04/2014   [employer] (serial number [id number]) dual chamber permanent pacemaker implanted by Dr Ladona Ridgel  . S/P redo patent foramen ovale  closure 02/12/2014  . S/P redo tricuspid valve repair 02/12/2014   Complex valvuloplasty including patch closure of VSD with plication of septal leaflet and 26 mm Edwards mc3 ring annuloplasty  . S/P ventricular septal defect repair + redo tricuspid valve repair + redo closure PFO 02/12/2014   Redo sternotomy for bovine pericardial patch repair of large ventricular septal defect (recurrent LVOT to RA fistula due to bacterial endocarditis)  . Smokeless tobacco use   . Third degree heart block (HCC) 12/31/2013  . Thrombocytopenia (HCC) 12/2013  . Tricuspid regurgitation 02/11/2014  . Ventricular septal defect 02/12/2014   Post-infectious s/p repair of LVOT to RA fistula for bacterial endocarditis    Past Surgical History:  Procedure Laterality Date  . ASCENDING AORTIC ROOT REPLACEMENT N/A 12/30/2013   Procedure: ASCENDING AORTIC ROOT REPLACEMENT with 38mm HOMOGRAFT, CLOSURE OF AORTIC TO RIGHT ATRIAL FISTULA, CLOSURE OF PATENT FORAMEN OVALE;  Surgeon: Delight Ovens, MD;  Location: [facility] OR;  Service: Open Heart Surgery;  Laterality: N/A;  . INSERT / REPLACE / REMOVE PACEMAKER  01/04/2014  . INTRAOPERATIVE TRANSESOPHAGEAL ECHOCARDIOGRAM N/A 12/30/2013   Procedure: INTRAOPERATIVE TRANSESOPHAGEAL ECHOCARDIOGRAM;  Surgeon: Delight Ovens, MD;  Location[facility] OR;  Service: Open Heart Surgery;  Laterality: N/A;  . PACEMAKER INSERTION Left 04/06/2019   Procedure: PACEMAKER LEAD INSERTION;  Surgeon: Marinus Maw, MD;  Location: [facility] OR;  Service: Cardiovascular;  Laterality: Left;  . PACEMAKER LEAD REMOVAL N/A 04/06/2019   Procedure: PACEMAKER LEAD REMOVAL;  Surgeon: Marinus Maw, MD;  Location: [facility] OR;  Service: Cardiovascular;  Laterality: N/A;  . PERMANENT PACEMAKER INSERTION N/A 01/04/2014   Procedure: PERMANENT PACEMAKER INSERTION;  Surgeon: Marinus Maw, MD;  Location: [facility] CATH LAB;  Service: Cardiovascular;  Laterality: N/A;  . RIGHT HEART CATHETERIZATION N/A 02/12/2014   Procedure: RIGHT HEART  CATH;  Surgeon: Laurey Morale, MD;  Location: [facility] CATH LAB;  Service: Cardiovascular;  Laterality: N/A;  . TEE WITHOUT CARDIOVERSION N/A 02/12/2014   Procedure: TRANSESOPHAGEAL ECHOCARDIOGRAM (TEE);  Surgeon: Quintella Reichert, MD;  Location: [facility] ENDOSCOPY;  Service: Cardiovascular;  Laterality: N/A;  . TEE WITHOUT CARDIOVERSION N/A 04/30/2014   Procedure: TRANSESOPHAGEAL ECHOCARDIOGRAM (TEE);  Surgeon: Jake Bathe, MD;  Location: [facility] ENDOSCOPY;  Service: Cardiovascular;  Laterality: N/A;  . TRICUSPID VALVE REPLACEMENT N/A 12/30/2013   Procedure: REPAIR OF TRICUSPID VALVE LEAFLET;  Surgeon: Delight Ovens, MD;  Location[facility] OR;  Service: Open Heart Surgery;  Laterality: N/A;  . TRICUSPID VALVE REPLACEMENT N/A 02/12/2014   Procedure: TRICUSPID VALVE REPAIR, REDO MEDIAN STERNOTOMY, REPAIR OF VENTRICULAR SEPTAL DEFECT (RECURRENT LV OUTFLOW TRACT TO RIGHT ATRIAL FISTULA), REDO CLOSURE OF PATENT FORAMEN OVALE;  Surgeon: Marilu Favre  Zadie CleverlyH Owen, MD;  Location: [facility] OR;  Service: Open Heart Surgery;  Laterality: N/A;  . VENOGRAM Left 04/06/2019   Procedure: Venogram;  Surgeon: Marinus Mawaylor, Gregg W, MD;  [facility] OR;  Service: Cardiovascular;  Laterality: Left;    Family History  Problem Relation Age of Onset  . Migraines Mother   . Migraines Brother   . Heart murmur Brother       Social History   Socioeconomic History  . Marital status: Married    Spouse name: Sirena  . Number of children: 2  . Years of education: 2512  . Highest education level: Some college, no degree  Occupational History  . Not on file  Tobacco Use  . Smoking status: Never Smoker  . Smokeless tobacco: Former Engineer, [employer]  Substance and Sexual Activity  . Alcohol use: Yes    Alcohol/week: 0.0 standard drinks    Comment: socially  . Drug use: No  . Sexual activity: Yes    Birth control/protection: Condom  Other Topics Concern  . Not on file  Social History Narrative   Lives with wife, children   Caffeine- coffee, 1 c, 2-3 cans soda a  day   Social Determinants of Health   Financial Resource Strain: Not on file  Food Insecurity: Not on file  Transportation Needs: Not on file  Physical Activity: Not on file  Stress: Not on file  Social Connections: Not on file    Allergies  Allergen Reactions  . Cefepime Other (See Comments)    Leukopenia not clear if due to vancomycin vs cefepime. WBC dropped down  . Vancomycin Other (See Comments)    ? Drug induced leukopenia vs being due to cefepime. WBC dropped down     Current Outpatient Medications:  .  acetaminophen (TYLENOL) 500 MG tablet, Take 1,000 mg by mouth every 6 (six) hours as needed for moderate pain or headache., Disp: , Rfl:  .  aspirin EC 81 MG tablet, Take 81 mg by mouth daily. , Disp: , Rfl:  .  aspirin-acetaminophen-caffeine (EXCEDRIN MIGRAINE) 250-250-65 MG tablet, Take by mouth every 6 (six) hours as needed for headache., Disp: , Rfl:  .  bismuth subsalicylate (PEPTO BISMOL) 262 MG/15ML suspension, Take 30 mLs by mouth every 6 (six) hours as needed for indigestion or diarrhea or loose stools., Disp: , Rfl:  .  Metoprolol Succinate 25 MG CS24, Take 1 tablet by mouth daily., Disp: , Rfl:  saReview of Systems  Constitutional: Negative for activity change, appetite change, chills, diaphoresis, fatigue, fever and unexpected weight change.  HENT: Negative for congestion, rhinorrhea, sinus pressure, sneezing, sore throat and trouble swallowing.   Eyes: Negative for photophobia and visual disturbance.  Respiratory: Positive for shortness of breath. Negative for cough, chest tightness, wheezing and stridor.   Cardiovascular: Negative for chest pain, palpitations and leg swelling.  Gastrointestinal: Negative for abdominal distention, abdominal pain, anal bleeding, blood in stool, constipation, diarrhea, nausea and vomiting.  Genitourinary: Negative for difficulty urinating, dysuria, flank pain and hematuria.  Musculoskeletal: Negative for arthralgias, back pain,  gait problem, joint swelling and myalgias.  Skin: Negative for color change, pallor, rash and wound.  Neurological: Positive for headaches. Negative for dizziness, tremors, weakness and light-headedness.  Hematological: Negative for adenopathy. Does not bruise/bleed easily.  Psychiatric/Behavioral: Negative for agitation, behavioral problems, confusion, decreased concentration, dysphoric mood and sleep disturbance.       Objective:   Physical Exam Constitutional:      Appearance: He is well-developed and well-nourished.  HENT:  Head: Normocephalic and atraumatic.  Eyes:     Extraocular Movements: EOM normal.     Conjunctiva/sclera: Conjunctivae normal.  Cardiovascular:     Rate and Rhythm: Normal rate and regular rhythm.     Heart sounds: No murmur heard. No friction rub. No gallop.   Pulmonary:     Effort: Pulmonary effort is normal. No respiratory distress.     Breath sounds: Normal breath sounds. No stridor. No wheezing.  Abdominal:     General: There is no distension.     Palpations: Abdomen is soft.  Musculoskeletal:        General: No tenderness or edema. Normal range of motion.     Cervical back: Normal range of motion and neck supple.  Skin:    General: Skin is warm and dry.     Coloration: Skin is not pale.     Findings: No erythema or rash.  Neurological:     General: No focal deficit present.     Mental Status: He is alert and oriented to person, place, and time.  Psychiatric:        Mood and Affect: Mood normal.        Behavior: Behavior normal.        Thought Content: Thought content normal.           Assessment & Plan:    Group B streptococcal Aortic valve endocarditis and Aorto-Right Atrial fistula sp AVR, TV repair, repair of Aorto to Right atrial fistula, placement of PM due to 3rd degree heart block, then Ventricular Septal Defect (recurrent LV outflow tract to right atrial fistula, Severe Tricuspid Regurgitatio, Recurrent Patent Foramen Ovale,  and Acute Congestive Heart Failure. Sp Redo Median Sternotom, Repair of Ventricular Septal Defect, Double layer bovine pericardial patch closure, Tricuspid Valve Repair, Complex Valvuloplasty including plication of septal leaflet, Edwards mc3 Ring annuloplasty with Group B Streptococcus isolated from heart valve sp repeat IV rocephin x 6 weeks with 2 weeks of gentamicin, changed to oral amoxicillin but now yet again with evidence of large vegetation + possible abscess vs cavity. Blood cultures are NG on amoxicillin and he was broadened IV vancomycin cefepime with course complicated by leukopenia that resolved when vancomycin and then cefepime stopped, bridged with levaquin then to daptomycin for 7 weeks of therapy follwed by high dose levaquin with recent TTE not showing vegetation anymore in when last looked at.  He then came off antibiotics without my knowledge had some symptoms of migrainous headaches or vision reinitiate amoxicillin.  We did a work-up for infection including CT of the brain with contrast that showed no evidence of brain abscess lumbar puncture off antibiotics that was unremarkable.  He seems to have no evidence of recurrence that I can find at this point in time.  He remains off antibiotics and does not have any objective evidence of endocarditis recurring or pacemaker infection recurring.  Dyspnea on exertion with headaches: Uncertain what the cause of this is I am not sure what to make of the "mosaic pattern seen on CT angiogram he is going to see pulmonary for formal formal PFTs and perhaps a blood gas would be helpful well.  He is also scheduled for repeat echocardiogram.  COVID prevention he had COVID-19 once he has not been vaccinated which I encouraged him to do.  He can follow-up in our clinic as needed.  Again thanked me for all of my help with his case

## 2020-03-03 ENCOUNTER — Ambulatory Visit: Payer: 59 | Admitting: Internal Medicine

## 2020-03-03 ENCOUNTER — Encounter: Payer: Self-pay | Admitting: Internal Medicine

## 2020-03-03 VITALS — BP 118/72 | HR 73 | Temp 97.3°F | Ht 65.0 in | Wt 231.8 lb

## 2020-03-03 DIAGNOSIS — I5020 Unspecified systolic (congestive) heart failure: Secondary | ICD-10-CM

## 2020-03-03 DIAGNOSIS — R0602 Shortness of breath: Secondary | ICD-10-CM | POA: Diagnosis not present

## 2020-03-03 DIAGNOSIS — Z952 Presence of prosthetic heart valve: Secondary | ICD-10-CM | POA: Diagnosis not present

## 2020-03-03 MED ORDER — ALBUTEROL SULFATE HFA 108 (90 BASE) MCG/ACT IN AERS: 2.0000 | INHALATION_SPRAY | Freq: Four times a day (QID) | RESPIRATORY_TRACT | 2 refills | Status: DC | PRN

## 2020-03-03 NOTE — Patient Instructions (Signed)
The patient should have follow up scheduled with myself in 6 weeks.   Prior to next visit patient should have: Full set of PFTs  Take the albuterol rescue inhaler every 4 to 6 hours as needed for wheezing or shortness of breath. You can also take it 15 minutes before exercise or exertional activity. Side effects include heart racing or pounding, jitters or anxiety. If you have a history of an irregular heart rhythm, it can make this worse. Can also give some patients a hard time sleeping.  To inhale the aerosol using an inhaler, follow these steps:  1. Remove the protective dust cap from the end of the mouthpiece. If the dust cap was not placed on the mouthpiece, check the mouthpiece for dirt or other objects. Be sure that the canister is fully and firmly inserted in the mouthpiece. 2. If you are using the inhaler for the first time or if you have not used the inhaler in more than 14 days, you will need to prime it. You may also need to prime the inhaler if it has been dropped. Ask your pharmacist or check the manufacturer's information if this happens. To prime the inhaler, shake it well and then press down on the canister 4 times to release 4 sprays into the air, away from your face. Be careful not to get albuterol in your eyes. 3. Shake the inhaler well. 4. Breathe out as completely as possible through your mouth. 4. Hold the canister with the mouthpiece on the bottom, facing you and the canister pointing upward. Place the open end of the mouthpiece into your mouth. Close your lips tightly around the mouthpiece. 6. Breathe in slowly and deeply through the mouthpiece.At the same time, press down once on the container to spray the medication into your mouth. 7. Try to hold your breath for 10 seconds. remove the inhaler, and breathe out slowly. 8. If you were told to use 2 puffs, wait 1 minute and then repeat steps 3-7. 9. Replace the protective cap on the inhaler. 10. Clean your inhaler  regularly. Follow the manufacturer's directions carefully and ask your doctor or pharmacist if you have any questions about cleaning your inhaler.  Check the back of the inhaler to keep track of the total number of doses left on the inhaler.

## 2020-03-03 NOTE — Telephone Encounter (Signed)
Agree with cardiology and pulmonology follow up first. -VRP

## 2020-03-03 NOTE — Telephone Encounter (Signed)
Called and spoke with pt. Relayed Dr. Richrd Humbles message. Advised if he needs to be seen after those appt at our office, to call and we can get him scheduled for an appt. He verbalized understanding and appreciation.

## 2020-03-03 NOTE — Progress Notes (Signed)
[name]    [id number]    May 14, 1985  Primary Care Physician:Meyers, Jeannett Senior, MD  Referring Physician: [name], MD [address] [place],  [place] [address] Reason for Consultation: shortness of breath Date of Consultation: 03/03/2020  Chief complaint:   Chief Complaint  Patient presents with  . Consult    SOB x 2 weeks      HPI: [name] is a 35 y.o. gentleman with a complicated medical history stemming from an episode of bacterial endocarditis in 2015.  He is s/p AVR, fistula repair,  large VSD and had repair of VSD, with redo-sternotomy, tricuspid valve repair and redo PFO closure in 2016. He has a PPM in for complete heart block.  He denies any childhood respiratory disease, asthma, allergies.  There is no significant family history of either of these conditions.  Notes shortness of breath with difficulty getting a deep breath in for the last two weeks. He has difficulty getting a deep breath in. Gets worse with exertion.  Associated with being lightheaded and dizzy. Denies cough, chest tightness or wheezing.  No nocturnal association with the symptoms.  He denies excessive fatigue. Checks his oxygen levels at home and says they are over 92%.   He was seen in the ED recently for chest pain and a CT PE study was obtained as well as rule out MI protocol.  He was negative for PE and MI.  CT PE study did show some mosaic attenuation and the ED provider discussed with him that there could be some evidence of lung disease and he should see a pulmonologist.  He has since seen cardiology as well as infectious disease.  Repeat echocardiogram is pending given the worsening of his shortness of breath over the last 2 weeks.  Social history:  Occupation: works for [employer].  Exposures: previous vaping, no ongoing smoke exposure  Social History   Occupational History  . Not on file  Tobacco Use  . Smoking status: Never Smoker  .  Smokeless tobacco: Former Engineer, water and Sexual Activity  . Alcohol use: Yes    Alcohol/week: 0.0 standard drinks    Comment: socially  . Drug use: No  . Sexual activity: Yes    Birth control/protection: Condom    Relevant family history:  Family History  Problem Relation Age of Onset  . Migraines Mother   . Migraines Brother   . Heart murmur Brother   . Asthma Neg Hx     Past Medical History:  Diagnosis Date  . AKI (acute kidney injury) (HCC)   . Aortic valve vegetation on echo 04/29/14 04/29/2014   managed medically  . Brain abscess   . Chest pain 03/02/2020  . Congestive dilated cardiomyopathy (HCC) 06/23/2015  . Drug-induced leukopenia (HCC) 06/15/2014  . Endocarditis of aortic valve-November 2015   . Fracture of left femur (HCC)   . History of open heart surgery    December 30, 2013- ascending aortic root replacement, TEE  . Lightheaded 03/02/2020  . Meningitis    Group B Strep (November 2015)  . Migraine headache 10/15/2019   since 2008  . Obesity   . Patent foramen ovale    Closed during procedure on December 30, 2013  . Presence of permanent cardiac pacemaker 01/04/2014   PPM MEDTRONIC FOR COMPLETE HEART BLOCK  . 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 S/P aortic root and valve allograft 12/30/2013   Human  allograft aortic root replacement with repair of aorta-right atrial fistula and tricuspid valve repair  . S/P placement of cardiac pacemaker 01/04/2014   Medtronic (serial number [id number]) dual chamber permanent pacemaker implanted by Dr Ladona Ridgel  . S/P redo patent foramen ovale closure 02/12/2014  . S/P redo tricuspid valve repair 02/12/2014   Complex valvuloplasty including patch closure of VSD with plication of septal leaflet and 26 mm Edwards mc3 ring annuloplasty  . S/P ventricular septal defect repair + redo tricuspid valve repair + redo closure PFO 02/12/2014   Redo sternotomy for bovine pericardial patch repair of large ventricular septal defect  (recurrent LVOT to RA fistula due to bacterial endocarditis)  . Smokeless tobacco use   . Third degree heart block (HCC) 12/31/2013  . Thrombocytopenia (HCC) 12/2013  . Tricuspid regurgitation 02/11/2014  . Ventricular septal defect 02/12/2014   Post-infectious s/p repair of LVOT to RA fistula for bacterial endocarditis    Past Surgical History:  Procedure Laterality Date  . ASCENDING AORTIC ROOT REPLACEMENT N/A 12/30/2013   Procedure: ASCENDING AORTIC ROOT REPLACEMENT with 54mm HOMOGRAFT, CLOSURE OF AORTIC TO RIGHT ATRIAL FISTULA, CLOSURE OF PATENT FORAMEN OVALE;  Surgeon: Delight Ovens, MD;  Location: [facility] OR;  Service: Open Heart Surgery;  Laterality: N/A;  . INSERT / REPLACE / REMOVE PACEMAKER  01/04/2014  . INTRAOPERATIVE TRANSESOPHAGEAL ECHOCARDIOGRAM N/A 12/30/2013   Procedure: INTRAOPERATIVE TRANSESOPHAGEAL ECHOCARDIOGRAM;  Surgeon: Delight Ovens, MD;  Location: [facility] OR;  Service: Open Heart Surgery;  Laterality: N/A;  . PACEMAKER INSERTION Left 04/06/2019   Procedure: PACEMAKER LEAD INSERTION;  Surgeon: Marinus Maw, MD;  Location: [facility] OR;  Service: Cardiovascular;  Laterality: Left;  . PACEMAKER LEAD REMOVAL N/A 04/06/2019   Procedure: PACEMAKER LEAD REMOVAL;  Surgeon: Marinus Maw, MD;  Location: [facility] OR;  Service: Cardiovascular;  Laterality: N/A;  . PERMANENT PACEMAKER INSERTION N/A 01/04/2014   Procedure: PERMANENT PACEMAKER INSERTION;  Surgeon: Marinus Maw, MD;  Location[facility] CATH LAB;  Service: Cardiovascular;  Laterality: N/A;  . RIGHT HEART CATHETERIZATION N/A 02/12/2014   Procedure: RIGHT HEART CATH;  Surgeon: Laurey Morale, MD;  Location: [facility] CATH LAB;  Service: Cardiovascular;  Laterality: N/A;  . TEE WITHOUT CARDIOVERSION N/A 02/12/2014   Procedure: TRANSESOPHAGEAL ECHOCARDIOGRAM (TEE);  Surgeon: Quintella Reichert, MD;  Location: [facility] ENDOSCOPY;  Service: Cardiovascular;  Laterality: N/A;  . TEE WITHOUT CARDIOVERSION N/A 04/30/2014   Procedure: TRANSESOPHAGEAL  ECHOCARDIOGRAM (TEE);  Surgeon: Jake Bathe, MD;  Location: [facility] ENDOSCOPY;  Service: Cardiovascular;  Laterality: N/A;  . TRICUSPID VALVE REPLACEMENT N/A 12/30/2013   Procedure: REPAIR OF TRICUSPID VALVE LEAFLET;  Surgeon: Delight Ovens, MD;  Location: [facility] OR;  Service: Open Heart Surgery;  Laterality: N/A;  . TRICUSPID VALVE REPLACEMENT N/A 02/12/2014   Procedure: TRICUSPID VALVE REPAIR, REDO MEDIAN STERNOTOMY, REPAIR OF VENTRICULAR SEPTAL DEFECT (RECURRENT LV OUTFLOW TRACT TO RIGHT ATRIAL FISTULA), REDO CLOSURE OF PATENT FORAMEN OVALE;  Surgeon: Purcell Nails, MD;  Location: [facility] OR;  Service: Open Heart Surgery;  Laterality: N/A;  . VENOGRAM Left 04/06/2019   Procedure: Venogram;  Surgeon: Marinus Maw, MD;  Location: [facility] OR;  Service: Cardiovascular;  Laterality: Left;     Physical Exam: Blood pressure 118/72, pulse 73, temperature (!) 97.3 F (36.3 C), height 5\' 5"  (1.651 m), weight 231 lb 12.8 oz (105.1 kg), SpO2 98 %. Gen:      No acute distress ENT:  no nasal polyps, mucus membranes moist Lungs:    No increased respiratory  effort, symmetric chest wall excursion, clear to auscultation bilaterally, no wheezes or crackles CV:         Regular rate and rhythm; no murmurs, rubs, or gallops.  No pedal edema Abd:      + bowel sounds; soft, non-tender; no distension MSK: no acute synovitis of DIP or PIP joints, no mechanics hands.  Skin:      Warm and dry; no rashes Neuro: normal speech, no focal facial asymmetry Psych: alert and oriented x3, normal mood and affect   Data Reviewed/Medical Decision Making:  Independent interpretation of tests: Imaging: . Review of patient's CT Chest Feb 28 2020 images revealed no PE, mosaic attentuation. The patient's images have been independently reviewed by me.    PFTs: None on file  Labs:  Lab Results  Component Value Date   WBC 7.9 02/27/2020   HGB 16.9 02/27/2020   HCT 48.9 02/27/2020   MCV 87.2 02/27/2020   PLT 211 02/27/2020   Lab  Results  Component Value Date   NA 139 02/27/2020   K 3.7 02/27/2020   CL 105 02/27/2020   CO2 25 02/27/2020     Immunization status:  Immunization History  Administered Date(s) Administered  . Influenza,inj,Quad PF,6+ Mos 11/15/2014, 12/07/2015, 12/06/2017  . PPD Test 06/29/2016  . Tdap 10/27/2012, 07/25/2015    . I reviewed prior external note(s) from ED, hospital stays, cardiology . I reviewed the result(s) of the labs and imaging as noted above.  . 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 Konecny 35 year old cardiac history stemming from subacute bacterial endocarditis in 2015.  He presents with 2 weeks of dyspnea at rest with difficulty taking a deep breath in.  There is some evidence of mosaic attenuation on his CT scan, which can be seen in air trapping conditions like asthma.  His symptoms do not sound classic for asthma, however we can trial an albuterol as needed to see if there is any improvement in his symptoms.  Obtain full set of PFTs to help further clarify the nature of his lung disease.  Lastly the patient mentions that he is anxious, and that his wife is also mentions anxiety.  I did explain that anxiety can certainly cause shortness of breath, but at this point it is a diagnosis of exclusion.  I do want to make sure that there is nothing intrinsically wrong with his lungs or his heart before we go down that road.  However I do think it is a good idea to start seeking out therapy options.  Will trial albuterol prn Get PFTs Follow up echo We discussed disease management and progression at length today.   Return to Care: Return in about 6 weeks (around 04/14/2020).  Durel Salts, MD Pulmonary and Critical Care Medicine [facility] Office:[phone]  CC: [name], MD

## 2020-03-08 ENCOUNTER — Other Ambulatory Visit: Payer: Self-pay | Admitting: Cardiology

## 2020-03-14 MED ORDER — METOPROLOL SUCCINATE 25 MG PO CS24
1.0000 | EXTENDED_RELEASE_CAPSULE | Freq: Every day | ORAL | 11 refills | Status: DC
Start: 2020-03-14 — End: 2020-05-02

## 2020-03-16 ENCOUNTER — Ambulatory Visit (HOSPITAL_COMMUNITY): Payer: 59 | Attending: Cardiology

## 2020-03-16 ENCOUNTER — Other Ambulatory Visit: Payer: Self-pay

## 2020-03-16 ENCOUNTER — Institutional Professional Consult (permissible substitution): Payer: 59 | Admitting: Internal Medicine

## 2020-03-16 DIAGNOSIS — Z952 Presence of prosthetic heart valve: Secondary | ICD-10-CM | POA: Diagnosis present

## 2020-03-16 LAB — ECHOCARDIOGRAM COMPLETE
AR max vel: 1.61 cm2
AV Area VTI: 1.72 cm2
AV Area mean vel: 1.6 cm2
AV Mean grad: 17.3 mmHg
AV Peak grad: 33 mmHg
Ao pk vel: 2.87 m/s
Area-P 1/2: 4.15 cm2
S' Lateral: 3.1 cm

## 2020-03-28 NOTE — Telephone Encounter (Signed)
Patient advised of this. Questions answered. Advised if he has further questions or concerns to please call back. Agreeable to plan.

## 2020-03-28 NOTE — Telephone Encounter (Signed)
Transmission recieved and reviewed. Normal device function. No episodes logged. Patient reports over the weekend he was sitting down and had sudden onset of lightheadedness (states it was the same feeling when we test his pacemaker and lower his rate). Denies loc. States he started metoprolol succinate 25 mg daily 03/14/20. Advised I do not see any events on his device that would warrant these symptoms but I would send to Dr. Ladona Ridgel for review and we will call with changes. Verbalized understanding.  ED precautions discussed with verbal understanding.

## 2020-03-29 ENCOUNTER — Telehealth: Payer: Self-pay | Admitting: Internal Medicine

## 2020-03-29 MED ORDER — AMOXICILLIN 500 MG PO TABS: ORAL_TABLET | ORAL | 6 refills | Status: DC

## 2020-03-29 NOTE — Progress Notes (Signed)
Cardiology Office Note:    Date:  04/01/2020   ID:  [name], DOB 12/21/1985, MRN [id number]  PCP:  [name], MD  Cardiologist:  Olga Millers, MD  Electrophysiologist:  [name], MD   Referring MD: [name], MD   Chief Complaint: lightheadedness/dizziness, fatigue, and shortness of breath  History of Present Illness:    [name] is a 35 y.o. male with a history endocarditis of aortic valve in 12/2013 s/p aortic root replacement/AVR with homograft with close of PFO and TV repair; redo sternotomy in 02/2014 with repair of VSD, TV, and redo PFO closure; complete heart block post-op requiring pacemaker placement; non-ischemic cardiomyopathy with EF of 45-50%, meningitis, and migraines who is followed by Dr. Jens Som and presents today for multiple concerns.   Patient was first seen by Dr. Jens Som during an admission in 12/2013 for meningitis due to group B strep. 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 Patient became acutely unstable with severe AI and had to be intubated. CT surgery was consulted and patient underwent aortic root replacement with Homograft, tricuspid valve repair, and closure of PFO by Dr. Tyrone Sage on 12/30/2013. 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 He did develop complete heart block following surgery and underwent PPM placement on 01/04/2014. Patient was readmitted in 01/2014 after developing acute shortness of breath. Repeat TEE revealed a large VSD with left to right shunting from the LVOT as well as disruption of the previously repaired fistula and aortic root abscess cavity. Therefore, he underwent redo sternotomy with repair of VSD,  tricuspid valve repair, and redo closure of PFO by Dr. Cornelius Moras on 02/12/2014. A new murmur was heard in 04/2014. Echo was ordered and showed an aortic vegetation. Therefore, he was admitted again. He remained afebrile throughout the entire admission. Blood cultures were negative. TEE revealed a new large mobile irregular bordered echodensity along the anterior aspect of the subvalvular apparatus of the bioprosthetic aortic valve in the LVOT consistent with vegetation. He was seen by Dr. Tyrone Sage who recommended against a 3rd time redo. ID was consulted and IV antibiotics were resumed.  He presented to the ED in 03/2019 with dizziness and dyspnea and was found to have RV lead failure. He eventually underwent lead extraction on 04/06/2019 and a new Medtronic RV lead was placed and a Medtronic dual-chamber pacemaker was used to replace the previous pacemaker. Due to persistent dizziness, his Metoprolol was discontinued. Patient was seen by Azalee Course, PA-C, in the office after a ED visit for rapid heartbeat and dizziness. Recent device interrogation at that time showed normal device function with 3 episodes of AT/AF (longest lasting 7 minutes). Per [name]'s note, the dizziness was lasting much longer than the duration of AT/AF so something else was felt to be the cause. Patient was concerned because these were similar symptoms to how he felt before he was diagnosed with endocarditis. It was also discovered that he had not been on antibiotics since his device change. Repeat labs showed normal CBC and blood culures were negative. Urgent Echo was ordered showed LVEF of 40-45% with normal wall motion, mild AI, mild to moderate AS but no obvious vegetations. He was restarted on Amoxiciillin  and advised to follow-up with Dr. Algis LimingVanDam for final input on duration of this. He was seen by Dr. Algis LimingVanDam virtually who was quite concerned about the dizzy episodes and referred patient to Neurology. Dizziness was not felt to be cardiac in  origin.  Patient was seen in the ED on 02/27/2020 for chest pain. EKG showed sinus rhythm with V-pacing. Chest CTA was negative for PE but did describe mosaic appearance of the lung parenchyma bilaterally which patient was concerned about. He was referred to Pulmonology. He was seen by Dr. Jens Somrenshaw for follow-up on 02/29/2020 at which time he reported some increased dyspnea on exertion and at rest as well as some chest pressure if he takes a deep breath and some continued dizziness. Toprol-XL was restarted at 25mg  daily.Repeat Echo was ordered and showed LVEF of 45-50% with septal hypokinesis, grade 2 diastolic dysfunction, mild AI with mildly elevated mean gradient across AVR of 19 mmHg, trivial TR. RV normal in size with mildly reduced systolic function. No residual VSD noted..Stable from prior Echo.  Patient sent our office a MyChart message on 03/27/2020 with concerns of his heart rate dropping to the 50's and reports of feeling lightheaded and extremely exhausted. He was worried that his PPM was not working. He sent in a remote transmission and device was function normally.No episodes noted to explain symptoms. Patient called our office again on 03/29/2020 with continued concerns about heart rate variability as well as multiple other complaints including several weeks of feeling very fatigued, dizzy, short of breath, and intermittent chest pain. Therefore, this visit was scheduled to further discuss concerns. Of note,  Otilio SaberAndy Tillery, PA-C, did personally review transmission report and agreed with normal function of device and no episodes. Heart rates were in the 90's most of the time. He suggested possibly increase Toprol at today's visit.  Patient is here alone. He has multiple concerns. He explains that about 6 months ago (around the birth of his second child), he started having significant lightheadedness/dizziness and double vision. This progressively got worse. He was referred to Neurology who felt like  it could be due to migraines. They performed a head CT which was unremarkable. They recommended brain MRI but was concerned that pacemaker and aortic valve mesh may not be MRI compatible so this has not been performed. Symptoms subsided after 1 month but then returned about 1 month ago. He is feeling lightheaded/dizzy again even at rest. The other night he woke up around 2am and felt his heart racing. He checked his pulse and it was in the 100's. He is unsure if the heart racing woke him up from sleep or if he just noticed after he wok up. He continues to have dyspnea both at rest and with exertion. He has been seen by Pulmonology and they have ordered PFTs for later this month. No orthopnea. He reports rare episodes of possible PND. No significant edema (he states he ankles may have been a little swollen a couple of nights ago when he woke up with palpitations). He mentions that his abdomen has looked "fuller" even though he is losing weight. He reports very atypical chest pain that he describes as a sharp pain on the left side of his chest/left side. This pain only last for a couple of seconds and then resolves on its own. Sometimes happens when taking a deep breath. No exertional chest pain. This does not sound like cardiac chest pain. He denies any abnormal bleeding in urine or stools.  He describes  feeling fatigued and just feels "blah." He does not have much energy. He also does not have interest in doing the things he used to enjoy. He also mentions that he will just stare off into space for long periods of time. He works in [employer] office but states he has not been to work in about a month because of how he has been feeling. He is wondering if he could be a little depressed. We discussed that is very possible. He also mentions several times, "I know I sound paranoid but I am not." He just wants to make sure that he is not over looking anything significant health concerns. He has 2 young daughters and  he wants to make sure he is doing everything possible to be around for as long as possible for them. We discussed that is very natural to have some anxiety/depression after everything he has been through. He is not interested in taking any medications for anxiety or depression but he is open to talking with someone.  Past Medical History:  Diagnosis Date  . AKI (acute kidney injury) (HCC)   . Aortic valve vegetation on echo 04/29/14 04/29/2014   managed medically  . Brain abscess   . Chest pain 03/02/2020  . Congestive dilated cardiomyopathy (HCC) 06/23/2015  . Drug-induced leukopenia (HCC) 06/15/2014  . Endocarditis of aortic valve-November 2015   . Fracture of left femur (HCC)   . History of open heart surgery    December 30, 2013- ascending aortic root replacement, TEE  . Lightheaded 03/02/2020  . Meningitis    Group B Strep (November 2015)  . Migraine headache 10/15/2019   since 2008  . Obesity   . Patent foramen ovale    Closed during procedure on December 30, 2013  . Presence of permanent cardiac pacemaker 01/04/2014   PPM MEDTRONIC FOR COMPLETE HEART BLOCK  . 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 S/P aortic root and valve allograft 12/30/2013   Human allograft aortic root replacement with repair of aorta-right atrial fistula and tricuspid valve repair  . S/P placement of cardiac pacemaker 01/04/2014   Medtronic (serial number [id number]) dual chamber permanent pacemaker implanted by Dr Ladona Ridgel  . S/P redo patent foramen ovale closure 02/12/2014  . S/P redo tricuspid valve repair 02/12/2014   Complex valvuloplasty including patch closure of VSD with plication of septal leaflet and 26 mm Edwards mc3 ring annuloplasty  . S/P ventricular septal defect repair + redo tricuspid valve repair + redo closure PFO 02/12/2014   Redo sternotomy for bovine pericardial patch repair of large ventricular septal defect (recurrent LVOT to RA fistula due to bacterial endocarditis)  . Smokeless  tobacco use   . Third degree heart block (HCC) 12/31/2013  . Thrombocytopenia (HCC) 12/2013  . Tricuspid regurgitation 02/11/2014  . Ventricular septal defect 02/12/2014   Post-infectious s/p repair of LVOT to RA fistula for bacterial endocarditis    Past Surgical History:  Procedure Laterality Date  . ASCENDING AORTIC ROOT REPLACEMENT N/A 12/30/2013   Procedure: ASCENDING AORTIC ROOT REPLACEMENT with 27mm HOMOGRAFT, CLOSURE OF AORTIC TO RIGHT ATRIAL FISTULA, CLOSURE OF PATENT FORAMEN OVALE;  Surgeon: Delight Ovens, MD;  Location: [facility] OR;  Service: Open Heart Surgery;  Laterality: N/A;  . INSERT / REPLACE / REMOVE PACEMAKER  01/04/2014  . INTRAOPERATIVE TRANSESOPHAGEAL ECHOCARDIOGRAM N/A 12/30/2013   Procedure: INTRAOPERATIVE TRANSESOPHAGEAL ECHOCARDIOGRAM;  Surgeon: Delight Ovens, MD;  Location: [facility] OR;  Service: Open Heart Surgery;  Laterality: N/A;  . PACEMAKER INSERTION  Left 04/06/2019   Procedure: PACEMAKER LEAD INSERTION;  Surgeon: Marinus Maw, MD;  Location: [facility] OR;  Service: Cardiovascular;  Laterality: Left;  . PACEMAKER LEAD REMOVAL N/A 04/06/2019   Procedure: PACEMAKER LEAD REMOVAL;  Surgeon: Marinus Maw, MD;  Location: [facility] OR;  Service: Cardiovascular;  Laterality: N/A;  . PERMANENT PACEMAKER INSERTION N/A 01/04/2014   Procedure: PERMANENT PACEMAKER INSERTION;  Surgeon: Marinus Maw, MD;  Location: [facility] CATH LAB;  Service: Cardiovascular;  Laterality: N/A;  . RIGHT HEART CATHETERIZATION N/A 02/12/2014   Procedure: RIGHT HEART CATH;  Surgeon: Laurey Morale, MD;  Location: [facility] CATH LAB;  Service: Cardiovascular;  Laterality: N/A;  . TEE WITHOUT CARDIOVERSION N/A 02/12/2014   Procedure: TRANSESOPHAGEAL ECHOCARDIOGRAM (TEE);  Surgeon: Quintella Reichert, MD;  Location: [facility] ENDOSCOPY;  Service: Cardiovascular;  Laterality: N/A;  . TEE WITHOUT CARDIOVERSION N/A 04/30/2014   Procedure: TRANSESOPHAGEAL ECHOCARDIOGRAM (TEE);  Surgeon: Jake Bathe, MD;  Location: [facility] ENDOSCOPY;  Service:  Cardiovascular;  Laterality: N/A;  . TRICUSPID VALVE REPLACEMENT N/A 12/30/2013   Procedure: REPAIR OF TRICUSPID VALVE LEAFLET;  Surgeon: Delight Ovens, MD;  Location: [facility] OR;  Service: Open Heart Surgery;  Laterality: N/A;  . TRICUSPID VALVE REPLACEMENT N/A 02/12/2014   Procedure: TRICUSPID VALVE REPAIR, REDO MEDIAN STERNOTOMY, REPAIR OF VENTRICULAR SEPTAL DEFECT (RECURRENT LV OUTFLOW TRACT TO RIGHT ATRIAL FISTULA), REDO CLOSURE OF PATENT FORAMEN OVALE;  Surgeon: Purcell Nails, MD;  Location: [facility] OR;  Service: Open Heart Surgery;  Laterality: N/A;  . VENOGRAM Left 04/06/2019   Procedure: Venogram;  Surgeon: Marinus Maw, MD;  Location: [facility] OR;  Service: Cardiovascular;  Laterality: Left;    Current Medications: Current Meds  Medication Sig  . acetaminophen (TYLENOL) 500 MG tablet Take 1,000 mg by mouth every 6 (six) hours as needed for moderate pain or headache.  . albuterol (VENTOLIN HFA) 108 (90 Base) MCG/ACT inhaler Inhale 2 puffs into the lungs every 6 (six) hours as needed for wheezing or shortness of breath.  Marland Kitchen amoxicillin (AMOXIL) 500 MG tablet Take all 4 tablets 1 hour prior to dental work  . aspirin EC 81 MG tablet Take 81 mg by mouth daily.   Marland Kitchen aspirin-acetaminophen-caffeine (EXCEDRIN MIGRAINE) 250-250-65 MG tablet Take by mouth every 6 (six) hours as needed for headache.  . bismuth subsalicylate (PEPTO BISMOL) 262 MG/15ML suspension Take 30 mLs by mouth every 6 (six) hours as needed for indigestion or diarrhea or loose stools.  . Metoprolol Succinate 25 MG CS24 Take 1 tablet by mouth daily.     Allergies:   Cefepime and Vancomycin   Social History   Socioeconomic History  . Marital status: Married    Spouse name: Sirena  . Number of children: 2  . Years of education: 12  . Highest education level: Some college, no degree  Occupational History  . Not on file  Tobacco Use  . Smoking status: Never Smoker  . Smokeless tobacco: Former Engineer, water and Sexual Activity   . Alcohol use: Yes    Alcohol/week: 0.0 standard drinks    Comment: socially  . Drug use: No  . Sexual activity: Yes    Birth control/protection: Condom  Other Topics Concern  . Not on file  Social History Narrative   Lives with wife, children   Caffeine- coffee, 1 c, 2-3 cans soda a day   Social Determinants of Health   Financial Resource Strain: Not on file  Food Insecurity: Not on file  Transportation Needs: Not  on file  Physical Activity: Not on file  Stress: Not on file  Social Connections: Not on file     Family History: The patient's family history includes Heart murmur in his brother; Migraines in his brother and mother. There is no history of Asthma.  ROS:   Please see the history of present illness.     EKGs/Labs/Other Studies Reviewed:    The following studies were reviewed today:  Echocardiogram 03/16/2020: Impressions: 1. Left ventricular ejection fraction, by estimation, is 45 to 50%. The  left ventricle has mildly decreased function. The left ventricle  demonstrates regional wall motion abnormalities with septal hypokinesis.  Left ventricular diastolic parameters are  consistent with Grade II diastolic dysfunction (pseudonormalization). No  residual VSD flow noted (s/p VSD repair).  2. Right ventricular systolic function is mildly reduced. The right  ventricular size is normal. There is normal pulmonary artery systolic  pressure. The estimated right ventricular systolic pressure is 24.7 mmHg.  3. The mitral valve is normal in structure. Trivial mitral valve  regurgitation. No evidence of mitral stenosis.  4. Human allograft aortic root and valve replacement. Mean gradient 19  mmHg, mildly elevated. Mild aortic insufficiency.  5. Status post tricuspid valve repair. Trivial tricuspid regurgitation.  Mean gradient 5 mmHg, mildly elevated.  6. The inferior vena cava is normal in size with greater than 50%  respiratory variability, suggesting right  atrial pressure of 3 mmHg.    EKG:  EKG ordered today. EKG personally reviewed and demonstrates V-paced rhythm with underlying sinus rhythm, rate 65 bpm. No acute changes from prior tracing.  Recent Labs: 02/27/2020: BUN 22; Creatinine, Ser 1.14; Hemoglobin 16.9; Platelets 211; Potassium 3.7; Sodium 139  Recent Lipid Panel    Component Value Date/Time   TRIG 119 12/29/2013 2200    Physical Exam:    Vital Signs: BP 120/82   Pulse 65   Ht 5\' 5"  (1.651 m)   Wt 234 lb 12.8 oz (106.5 kg)   SpO2 99%   BMI 39.07 kg/m     Wt Readings from Last 3 Encounters:  04/01/20 234 lb 12.8 oz (106.5 kg)  03/03/20 231 lb 12.8 oz (105.1 kg)  03/02/20 232 lb (105.2 kg)     General: 35 y.o. male in no acute distress. HEENT: Normocephalic and atraumatic. Sclera clear.  Neck: Supple. No carotid bruits. No JVD. Heart: RRR. Distinct S1 and S2. III/VI systolic murmur. No gallops or rubs. Radial pulses 2+ and equal bilaterally. Lungs: No increased work of breathing. Clear to ausculation bilaterally. No wheezes, rhonchi, or rales.  Abdomen: Soft, non-distended, and non-tender to palpation.  Extremities: No lower extremity edema.    Skin: Warm and dry. Neuro: Alert and oriented x3. No focal deficits. Psych: Normal affect. Responds appropriately.  Assessment:    1. Lightheadedness/Dizziness   2. Palpitations   3. Heart block AV complete (HCC)   4. S/P cardiac pacemaker procedure   5. Dyspnea, unspecified type   6. Non-ischemic cardiomyopathy (HCC)   7. Atypical chest pain   8. History of endocarditis   9. S/P AVR   10. S/P VSD repair   11. S/P patent foramen ovale closure   12. S/P tricuspid valve repair   13. Daytime somnolence   14. Snoring     Plan:    Lightheadedness/Dizziness - Symptoms lightheadedness/dizziness returned about 1 month ago. Occur at rest.  - Echo stable and no concerning arrhythmias on PPM device check. - I am not convinced that this is cardiac in  nature. He was  previously seen by Neurology for this who felt like it could possibly be due to migraines which he has a history of. They ordered head CT which was unremarkable. They recommended brain MRI but they had concerns that mesh used for AVR or PPM may not be MRI compatible. I am pretty sure is PPM is MRI compatible but I will confirm with EP. Will also discuss with Dr. Jens Som and make sure nothing used during AVR is not compatible with MRI. Once I find out, patient would like [place] to let his Neurologist know (Dr. Joycelyn Schmid).  Palpitations  CHB s/p Medtronic PPM - Patient has concerns about variable heart rate. The other night he woke up with heart rates in the low 100's. Recent remote transmission showed normal device function with no episodes to explain symptoms of lightheadedness/dizziness. Heart rates mostly in the 90's.  - Given reports of lightheadedness/dizziness and low-normal to normal BP at home, I will continue current dose of Toprol-XL for now. He was wondering if his Toprol could be causing the fatigue which is possible. Discussed switching to Coreg to see if that helps but he would like to hold off on this for now.   Dyspnea Non-Ischemic Cardiomyopathy - Chest CTA in 02/2020 negative for PE but showed mosaic pattern of lungs. He was referred to Pulmonology. - Recent Echo on 03/16/2020 showed LVEF of 45-50% with septal hypokinesis, grade 2 diastolic dysfunction, mild AI with mildly elevated mean gradient across AVR of 19 mmHg, trivial TR. RV normal in size with mildly reduced systolic function. No residual VSD noted. Stable from prior Echo. - Appears euvolemic on exam. I don't think dyspnea is due to CHF but will check BNP. - Continue beta-blocker as above. - Will hold off on adding ARB given reports of lightheadedness/dizziness and low-normal to normal BP at home. - Patient has been seen by Pulmonology and PFTs have been ordered for later this month which I think is a good idea.  Atypical  Chest Pain - Patient reports very atypical chest pain. Does not sound cardiac in nature. - EKG shows V-paced rhythm so difficult to assess ST/T segments. No significant changes compared to prior tracings though. - No additional work-up necessary at this time.  History of Endocarditis s/p Aortic Root Replacement/AVR in 12/2013 - S/p aortic root replacement/AVR in 12/2013.  - Recent Echo showed stable AVR with only mild AI. Mean gradient 19 mmHg.  Prior Tricuspid Valve Repair/ PFO Closure/ VSD Repair in 12/2013 and 02/2014 - Stable on most recent Echo. No residual VSD flow noted. Only trivial TR with mean gradient across TV mildly elevated at 5 mmHg.  Intermittent Snoring  Daytime Somnolence Fatigue - Patient reports  fatigue and daytime somnolence. He does not feel well rested in the morning. He reports snoring occasionally. BMI 39 so I wonder if he could have sleep apnea. Will check sleep study.  Possible Depression/Anxiety - Patient reports little interest in doing the things he used to enjoy. He also describes having no energy and weight loss and states he will catch himself just staring off into space for long periods of time. He is wondering if he could be depressed. It does sound like he may have some depression/anxiety. He does not want to take any medications for this but is open to talking with someone. Recommended reaching out to PCP to discuss possible referral to a Psychologist/Psychiatrist.  Disposition: Follow up with me in 1 month.   Medication Adjustments/Labs and Tests  Ordered: Current medicines are reviewed at length with the patient today.  Concerns regarding medicines are outlined above.  Orders Placed This Encounter  Procedures  . Pro b natriuretic peptide (BNP)[id number])  . EKG 12-Lead  . Split night study   No orders of the defined types were placed in this encounter.   Patient Instructions  Medication Instructions:  No Changes *If you  need a refill on your cardiac medications before your next appointment, please call your pharmacy*   Lab Work: BNP If you have labs (blood work) drawn today and your tests are completely normal, you will receive your results only by: Marland Kitchen MyChart Message (if you have MyChart) OR . A paper copy in the mail If you have any lab test that is abnormal or we need to change your treatment, we will call you to review the results.   Testing/Procedures: [facility], [address], [address] Your physician has recommended that you have a sleep study. This test records several body functions during sleep, including: brain activity, eye movement, oxygen and carbon dioxide blood levels, heart rate and rhythm, breathing rate and rhythm, the flow of air through your mouth and nose, snoring, body muscle movements, and chest and belly movement.    Follow-Up: At [facility], you and your health needs are our priority.  As part of our continuing mission to provide you with exceptional heart care, we have created designated Provider Care Teams.  These Care Teams include your primary Cardiologist (physician) and Advanced Practice Providers (APPs -  Physician Assistants and Nurse Practitioners) who all work together to provide you with the care you need, when you need it.    Your next appointment:   May 13, 2020 9:15 AM  The format for your next appointment:   In Person  Provider:   Marjie Skiff PA-C       Signed, Smitty Knudsen  04/01/2020 10:49 AM    [facility]

## 2020-03-29 NOTE — Telephone Encounter (Signed)
Agree with APP evaluation.  Needs SBE prophylaxis which is unchanged. Olga Millers

## 2020-03-29 NOTE — Telephone Encounter (Signed)
Spoke with pt who reports he woke last night with lightheadedness and heart rate in the 100's.  Pt states he also tried to check his heart rate manually and is was very irregular.  Pt did send a transmission at that time and spoke to on call cardiologist and was advised to contact cardiology today.  Pt denies current CP or SOB.  Will forward information to device clinic for review of transmission.  Pt verbalizes understanding and agrees with current plan.

## 2020-03-29 NOTE — Telephone Encounter (Signed)
Spoke with pt, Aware of dr Ludwig Clarks recommendations. New script sent to the pharmacy for antibiotics.

## 2020-03-29 NOTE — Telephone Encounter (Signed)
Spoke with pt and advised per Otilio Saber, PA-C no issues on transmission, no episodes and normal device function.  HR in the 90's most of the time.   Keep 04/01/2020 appointment with Marjie Skiff.  Reviewed ED precautions.  Pt verbalizes understanding and agrees with current plan.

## 2020-03-29 NOTE — Telephone Encounter (Signed)
Returned call to patient who states that he has been experiencing a lot of variations in his heart rate. Patient states his heart rate has been anywhere from 55-105. Patient reports waking up at 1am this morning with heart rate of 105 that lasted until 5am. Patient states he did take his metoprolol at 10pm last night. Patient states for the last several weeks he has been feeling very fatigued, dizzy, short of breath, experienced intermittent chest pain that occurs with deep inspiration and sometimes yawning, and over all feeling unwell. Patient denies fever. Patient states he has been seeing floaters in his eyes at random times and states he went to the eye dr regarding this issue and states nothing abnormal was found. Patient denies chest pain at present. Patient states that his feet and ankles were slightly swollen last night but denies any swelling at this time. Patient states his last blood pressure was 117/80 with HR of 85.Patient states he is unsure if his symptoms are related to his metoprolol or not. Made patient an appointment to address concerns on 2/18 with Marjie Skiff PA-C. Patient is concerned about present symptoms and would like to know if Dr. Jens Som has any recommendations.   Patient states he has also been off of antibiotics for a while and was wanting to inquire about a PRN antibiotic as well for dental procedures.   Advised patient I would forward message to Dr. Jens Som for him to review. Patient verbalized understanding.

## 2020-03-29 NOTE — Telephone Encounter (Signed)
STAT if HR is under 50 or over 120 (normal HR is 60-100 beats per minute)  1) What is your heart rate? In the 100's last night  2) Do you have a log of your heart rate readings (document readings)? No   3) Do you have any other symptoms? Winded, lightheadedness, diarrhea  Patient sent transmission last night due to high heart rate

## 2020-03-29 NOTE — Telephone Encounter (Signed)
STAT if HR is under 50 or over 120 (normal HR is 60-100 beats per minute)  1) What is your heart rate? 85 (laying in bed)   2) Do you have a log of your heart rate readings (document readings)?  03/29/20:  96 95 87 92 10 3) Do you have any other symptoms? Cold sweats   Pt c/o swelling: STAT is pt has developed SOB within 24 hours  1) How much weight have you gained and in what time span? No noticeable weight gain  2) If swelling, where is the swelling located? Ankles and feet  3) Are you currently taking a fluid pill? No   4) Are you currently SOB? No   5) Do you have a log of your daily weights (if so, list)? No   6) Have you gained 3 pounds in a day or 5 pounds in a week? No   Have you traveled recently? No   Pt c/o Shortness Of Breath: STAT if SOB developed within the last 24 hours or pt is noticeably SOB on the phone  1. Are you currently SOB (can you hear that pt is SOB on the phone)? No   2. How long have you been experiencing SOB? Several months per patient   3. Are you SOB when sitting or when up moving around? Both  4. Are you currently experiencing any other symptoms? Dizziness  STAT if patient feels like he/she is going to faint   Are you dizzy now? Yes  1) Do you feel faint or have you passed out? No   Do you have any other symptoms? No  2) Have you checked your HR and BP (record if available)?

## 2020-03-29 NOTE — Telephone Encounter (Signed)
No issues on his transmission. No episodes. Normal device function.     Histograms slightly right shifted with baseline HRs in 90s most of the time.    Plans for in office visit noted for 2/18. Could consider increasing his metoprolol at that visit.

## 2020-04-01 ENCOUNTER — Other Ambulatory Visit: Payer: Self-pay

## 2020-04-01 ENCOUNTER — Encounter: Payer: Self-pay | Admitting: Student

## 2020-04-01 ENCOUNTER — Ambulatory Visit (INDEPENDENT_AMBULATORY_CARE_PROVIDER_SITE_OTHER): Payer: 59 | Admitting: Student

## 2020-04-01 VITALS — BP 120/82 | HR 65 | Ht 65.0 in | Wt 234.8 lb

## 2020-04-01 DIAGNOSIS — I428 Other cardiomyopathies: Secondary | ICD-10-CM

## 2020-04-01 DIAGNOSIS — Z952 Presence of prosthetic heart valve: Secondary | ICD-10-CM

## 2020-04-01 DIAGNOSIS — Z9889 Other specified postprocedural states: Secondary | ICD-10-CM

## 2020-04-01 DIAGNOSIS — R400 Somnolence: Secondary | ICD-10-CM

## 2020-04-01 DIAGNOSIS — R002 Palpitations: Secondary | ICD-10-CM

## 2020-04-01 DIAGNOSIS — R0600 Dyspnea, unspecified: Secondary | ICD-10-CM

## 2020-04-01 DIAGNOSIS — Z950 Presence of cardiac pacemaker: Secondary | ICD-10-CM | POA: Diagnosis not present

## 2020-04-01 DIAGNOSIS — R42 Dizziness and giddiness: Secondary | ICD-10-CM | POA: Diagnosis not present

## 2020-04-01 DIAGNOSIS — R0683 Snoring: Secondary | ICD-10-CM

## 2020-04-01 DIAGNOSIS — I442 Atrioventricular block, complete: Secondary | ICD-10-CM

## 2020-04-01 DIAGNOSIS — Z8774 Personal history of (corrected) congenital malformations of heart and circulatory system: Secondary | ICD-10-CM

## 2020-04-01 DIAGNOSIS — Z8679 Personal history of other diseases of the circulatory system: Secondary | ICD-10-CM

## 2020-04-01 DIAGNOSIS — R0789 Other chest pain: Secondary | ICD-10-CM

## 2020-04-01 NOTE — Patient Instructions (Signed)
Medication Instructions:  No Changes *If you need a refill on your cardiac medications before your next appointment, please call your pharmacy*   Lab Work: BNP If you have labs (blood work) drawn today and your tests are completely normal, you will receive your results only by: Marland Kitchen MyChart Message (if you have MyChart) OR . A paper copy in the mail If you have any lab test that is abnormal or we need to change your treatment, we will call you to review the results.   Testing/Procedures: [facility], [address], [address] Your physician has recommended that you have a sleep study. This test records several body functions during sleep, including: brain activity, eye movement, oxygen and carbon dioxide blood levels, heart rate and rhythm, breathing rate and rhythm, the flow of air through your mouth and nose, snoring, body muscle movements, and chest and belly movement.    Follow-Up: At [facility], you and your health needs are our priority.  As part of our continuing mission to provide you with exceptional heart care, we have created designated Provider Care Teams.  These Care Teams include your primary Cardiologist (physician) and Advanced Practice Providers (APPs -  Physician Assistants and Nurse Practitioners) who all work together to provide you with the care you need, when you need it.    Your next appointment:   May 13, 2020 9:15 AM  The format for your next appointment:   In Person  Provider:   Marjie Skiff PA-C

## 2020-04-02 LAB — PRO B NATRIURETIC PEPTIDE: NT-Pro BNP: 65 pg/mL (ref 0–86)

## 2020-04-04 ENCOUNTER — Ambulatory Visit (INDEPENDENT_AMBULATORY_CARE_PROVIDER_SITE_OTHER): Payer: 59

## 2020-04-04 ENCOUNTER — Telehealth: Payer: Self-pay | Admitting: *Deleted

## 2020-04-04 DIAGNOSIS — I442 Atrioventricular block, complete: Secondary | ICD-10-CM

## 2020-04-04 LAB — CUP PACEART REMOTE DEVICE CHECK
Battery Remaining Longevity: 136 mo
Battery Voltage: 3.04 V
Brady Statistic AP VP Percent: 17.8 %
Brady Statistic AP VS Percent: 0 %
Brady Statistic AS VP Percent: 82.18 %
Brady Statistic AS VS Percent: 0.02 %
Brady Statistic RA Percent Paced: 17.78 %
Brady Statistic RV Percent Paced: 99.98 %
Date Time Interrogation Session: 20220220212053
Implantable Lead Implant Date: 20151123
Implantable Lead Implant Date: 20210222
Implantable Lead Location: 753859
Implantable Lead Location: 753860
Implantable Lead Model: 5076
Implantable Lead Model: 5076
Implantable Pulse Generator Implant Date: 20210222
Lead Channel Impedance Value: 323 Ohm
Lead Channel Impedance Value: 361 Ohm
Lead Channel Impedance Value: 513 Ohm
Lead Channel Impedance Value: 798 Ohm
Lead Channel Pacing Threshold Amplitude: 0.5 V
Lead Channel Pacing Threshold Amplitude: 0.875 V
Lead Channel Pacing Threshold Pulse Width: 0.4 ms
Lead Channel Pacing Threshold Pulse Width: 0.4 ms
Lead Channel Sensing Intrinsic Amplitude: 1.5 mV
Lead Channel Sensing Intrinsic Amplitude: 1.5 mV
Lead Channel Sensing Intrinsic Amplitude: 7.875 mV
Lead Channel Sensing Intrinsic Amplitude: 7.875 mV
Lead Channel Setting Pacing Amplitude: 1.5 V
Lead Channel Setting Pacing Amplitude: 2.5 V
Lead Channel Setting Pacing Pulse Width: 0.4 ms
Lead Channel Setting Sensing Sensitivity: 5.6 mV

## 2020-04-04 NOTE — Telephone Encounter (Signed)
PA submitted to [employer] via web portal for sleep study. 

## 2020-04-05 ENCOUNTER — Telehealth: Payer: Self-pay | Admitting: *Deleted

## 2020-04-05 NOTE — Telephone Encounter (Signed)
Patient notified of sleep study appointment scheduled for 04/14/20 at [facility]. Sleep labs contact phone # given to patient.

## 2020-04-06 NOTE — Progress Notes (Signed)
PPM is MRI compatible

## 2020-04-07 ENCOUNTER — Other Ambulatory Visit (HOSPITAL_COMMUNITY)
Admission: RE | Admit: 2020-04-07 | Discharge: 2020-04-07 | Disposition: A | Discharge status: Home / Self Care | Payer: 59 | Source: Ambulatory Visit | Attending: Internal Medicine | Admitting: Internal Medicine

## 2020-04-07 DIAGNOSIS — Z01812 Encounter for preprocedural laboratory examination: Secondary | ICD-10-CM | POA: Insufficient documentation

## 2020-04-07 DIAGNOSIS — Z20822 Contact with and (suspected) exposure to covid-19: Secondary | ICD-10-CM | POA: Insufficient documentation

## 2020-04-07 LAB — SARS CORONAVIRUS 2 (TAT 6-24 HRS): SARS Coronavirus 2: NEGATIVE

## 2020-04-08 NOTE — Progress Notes (Signed)
Remote pacemaker transmission.   

## 2020-04-10 NOTE — Progress Notes (Signed)
Cardiology Clinic Note   Patient Name: Merton Osler Date of Encounter: 04/11/2020  Primary Care Provider:  [name], MD Primary Cardiologist:  Olga Millers, MD  Patient Profile    Adiel Horng 35 year old male presents the clinic today for follow-up evaluation of his bacterial endocarditis.  Past Medical History    Past Medical History:  Diagnosis Date  . AKI (acute kidney injury) (HCC)   . Aortic valve vegetation on echo 04/29/14 04/29/2014   managed medically  . Brain abscess   . Chest pain 03/02/2020  . Congestive dilated cardiomyopathy (HCC) 06/23/2015  . Drug-induced leukopenia (HCC) 06/15/2014  . Endocarditis of aortic valve-November 2015   . Fracture of left femur (HCC)   . History of open heart surgery    December 30, 2013- ascending aortic root replacement, TEE  . Lightheaded 03/02/2020  . Meningitis    Group B Strep (November 2015)  . Migraine headache 10/15/2019   since 2008  . Obesity   . Patent foramen ovale    Closed during procedure on December 30, 2013  . Presence of permanent cardiac pacemaker 01/04/2014   PPM MEDTRONIC FOR COMPLETE HEART BLOCK  . 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 S/P aortic root and valve allograft 12/30/2013   Human allograft aortic root replacement with repair of aorta-right atrial fistula and tricuspid valve repair  . S/P placement of cardiac pacemaker 01/04/2014   Medtronic (serial number [id number]) dual chamber permanent pacemaker implanted by Dr Ladona Ridgel  . S/P redo patent foramen ovale closure 02/12/2014  . S/P redo tricuspid valve repair 02/12/2014   Complex valvuloplasty including patch closure of VSD with plication of septal leaflet and 26 mm Edwards mc3 ring annuloplasty  . S/P ventricular septal defect repair + redo tricuspid valve repair + redo closure PFO 02/12/2014   Redo sternotomy for bovine pericardial patch repair of large ventricular septal defect (recurrent LVOT to RA fistula due to bacterial endocarditis)   . Smokeless tobacco use   . Third degree heart block (HCC) 12/31/2013  . Thrombocytopenia (HCC) 12/2013  . Tricuspid regurgitation 02/11/2014  . Ventricular septal defect 02/12/2014   Post-infectious s/p repair of LVOT to RA fistula for bacterial endocarditis   Past Surgical History:  Procedure Laterality Date  . ASCENDING AORTIC ROOT REPLACEMENT N/A 12/30/2013   Procedure: ASCENDING AORTIC ROOT REPLACEMENT with 29mm HOMOGRAFT, CLOSURE OF AORTIC TO RIGHT ATRIAL FISTULA, CLOSURE OF PATENT FORAMEN OVALE;  Surgeon: Delight Ovens, MD;  Location: [facility] OR;  Service: Open Heart Surgery;  Laterality: N/A;  . INSERT / REPLACE / REMOVE PACEMAKER  01/04/2014  . INTRAOPERATIVE TRANSESOPHAGEAL ECHOCARDIOGRAM N/A 12/30/2013   Procedure: INTRAOPERATIVE TRANSESOPHAGEAL ECHOCARDIOGRAM;  Surgeon: Delight Ovens, MD;  Location: [facility] OR;  Service: Open Heart Surgery;  Laterality: N/A;  . PACEMAKER INSERTION Left 04/06/2019   Procedure: PACEMAKER LEAD INSERTION;  Surgeon: Marinus Maw, MD;  Location: [facility] OR;  Service: Cardiovascular;  Laterality: Left;  . PACEMAKER LEAD REMOVAL N/A 04/06/2019   Procedure: PACEMAKER LEAD REMOVAL;  Surgeon: Marinus Maw, MD;  Location: [facility] OR;  Service: Cardiovascular;  Laterality: N/A;  . PERMANENT PACEMAKER INSERTION N/A 01/04/2014   Procedure: PERMANENT PACEMAKER INSERTION;  Surgeon: Marinus Maw, MD;  Location: [facility] C[facility] LAB;  Service: Cardiovascular;  Laterality: N/A;  . RIGHT HEART CATHETERIZATION N/A 02/12/2014   Procedure: RIGHT HEART CATH;  Surgeon: Laurey Morale, MD;  Location: [facility] CATH LAB;  Service: Cardiovascular;  Laterality: N/A;  . TEE WITHOUT CARDIOVERSION N/A 02/12/2014  Procedure: TRANSESOPHAGEAL ECHOCARDIOGRAM (TEE);  Surgeon: Quintella Reichertraci R Turner, MD;  [facility];  Service: Cardiovascular;  Laterality: N/A;  . TEE WITHOUT CARDIOVERSION N/A 04/30/2014   Procedure: TRANSESOPHAGEAL ECHOCARDIOGRAM (TEE);  Surgeon: Jake BatheMark C Skains, MD;  [facility]  ENDOSCOPY;  Service: Cardiovascular;  Laterality: N/A;  . TRICUSPID VALVE REPLACEMENT N/A 12/30/2013   Procedure: REPAIR OF TRICUSPID VALVE LEAFLET;  Surgeon: Delight OvensEdward B Gerhardt, MD;  [facility];  Service: Open Heart Surgery;  Laterality: N/A;  . TRICUSPID VALVE REPLACEMENT N/A 02/12/2014   Procedure: TRICUSPID VALVE REPAIR, REDO MEDIAN STERNOTOMY, REPAIR OF VENTRICULAR SEPTAL DEFECT (RECURRENT LV OUTFLOW TRACT TO RIGHT ATRIAL FISTULA), REDO CLOSURE OF PATENT FORAMEN OVALE;  Surgeon: Purcell Nailslarence H Owen, MD;  Location: [facility] OR;  Service: Open Heart Surgery;  Laterality: N/A;  . VENOGRAM Left 04/06/2019   Procedure: Venogram;  Surgeon: Marinus Mawaylor, Gregg W, MD;  [facility];  Service: Cardiovascular;  Laterality: Left;    Allergies  Allergies  Allergen Reactions  . Cefepime Other (See Comments)    Leukopenia not clear if due to vancomycin vs cefepime. WBC dropped down  . Vancomycin Other (See Comments)    ? Drug induced leukopenia vs being due to cefepime. WBC dropped down    History of Present Illness    Mr. Donavan FoilBass is a PMH of aortic valve endocarditis 11/15 status post aortic root replacement/AVR and closure of PFO and TV repair, redo sternotomy 1/16 with repair of VSD, TV, and redo PFO closure, complete heart block postoperative requiring PPM, nonischemic cardiomyopathy with EF of 45-50%, meningitis, and migraines.  He was seen by Dr. Jens Somrenshaw while admitted 11/15 for meningitis due to group B strep.  Need TEE showed normal LV function with a large aortic valve regurgitation and severe AI, large tricuspid valve regurgitation and moderate TR, and a fistula from the aorta to the right atrium.  He became acutely unstable with severe AI had to be intubated.  Cardiothoracic surgery was contacted and he underwent aortic root replacement, AVR, tricuspid valve repair, and closure of PFO by Dr. Lowella FairyGerhart 12/30/2013.  During his procedure he was found to have a 3 cm fistula between the LVOT and right atrium.   He developed complete heart block and underwent PPM placement on 01/04/2014.  He was readmitted on 12/15 after developing acute shortness of breath.  TEE showed a large VSD with left-to-right shunting from the LVOT as well as disruption of the previous surgery.  Fistula and aortic root abscess.  He underwent redo sternotomy with repair of VSD, tricuspid valve repair, redo PFO closure by Dr. Freida BusmanAllen on 12/14/2014.  A new cardiac murmur was heard on 3/16.  Echocardiogram showed aortic vegetation.  He was again admitted he remained afebrile throughout his entire admission.  His blood cultures were were negative.  TEE showed a large mobile irregular bordered echodensity along the anterior aspect of the subclavicular apparatus of the bioprosthetic aortic valve.  He was seen by Dr. Tyrone SageGerhardt who recommended against a third redo.  Infectious disease was consulted and IV antibiotics were resumed.  He presented to the emergency department 2/21 with dizziness and dyspnea.  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  He underwent lead extraction 04/06/2019 and a new Medtronic RV lead was placed along with a Medtronic dual-chamber pacemaker.  Due to persistent dizziness his metoprolol was discontinued.  His device was interrogated in the office after his ED visit for rapid heartbeat and dizziness.  It was felt that his symptoms were lasting  much longer than his recorded AT/AF.  Patient was concerned due to his similarity of his previous symptoms which was a diagnosis of endocarditis.  It was discovered that he had not been taking his antibiotics since it was device change.  His repeat labs showed normal CBC and his blood cultures were negative.  And echocardiogram showed an LVEF of 40-45%, mild AI, mild to moderate AAS but no obvious vegetation.  He was restarted on amoxicillin and advised follow-up with Dr. Algis Liming.  He was seen and referred to neurology for follow-up of his dizziness.  This was not felt to be cardiac in nature.  He  sent a MyChart message on 03/27/2020 with concerns of low heart rate, feeling lightheaded, and extreme fatigue.  He was worried that his PPM was not working.  Please send a remote transmission and his device was functioning normally.  There were no episodes noted to explain his symptoms.  Contact the office again on 03/29/2020 with concerns about heart rate variability as well as multiple other complaints including several weeks of fatigue, dizziness, shortness of breath, and intermittent chest pain.  He was seen by Otilio Saber, PA-C.  His device was interrogated and showed normal function with no cardiac arrhythmias.  It was noted that his heart rates were in the 90s most the time.  He suggested possible increase in metoprolol.  He reported symptoms of lightheadedness, dizziness, and double vision.  The symptoms had gotten progressively worse and he was referred to neurology.  They recommended a head CT which was unremarkable.  They then recommended MRI brain which could not be completed due to concerns for his pacemaker, and aortic valve mesh.  His symptoms eventually subsided after about 1 month but then returned.  He reported that he woke up at 2 AM and felt his heart racing.  He checked his pulse and it was in the 100s.  He reported dyspnea both at rest and with exertion.  He was seen by pulmonology who ordered PFTs.  He also reported episodes of PND.  He also reported sharp chest pains that would last for seconds and then dissipate.  Hips not felt to be cardiac chest pain.  When he was last seen by Marjie Skiff, PA-C 04/01/2020 he reported that he felt like he may be depressed.  He also stated "I know I sound paranoid but I am not" several times.  He reported that he did not want anything overlooked that would be significant to his health.  He reported he had 2 young daughters and he wanted to do everything he could to be around his lungs possible for them.  His anxiety/depression were reviewed.  He was  not open to starting any new medications but was open to talking with someone.  He reports the clinic today for follow-up evaluation states he has frustration because he is now able to increase physical activity as he would want.  He reports that his pacemaker has a high limits at in 150-160 range.  He reports that as soon as he increase his physical activity due to his fitness level he reaches a heart rate in the one forties which is like physical activity.  He reports some anxiety related to going to the gym due to his lead fracture that he had while he was increasing his physical activity.  He reports he was told by Dr. Ladona Ridgel that he may not continue to lift weights.  We discussed the events surrounding his pacemaker wire lead fracture  and it does not seem like he was exercising with great intensity during that time.  I have encouraged him to slowly increase his physical activity as tolerated, continue to monitor his diet, will give him the mindfulness stress reduction sheet, and will have him follow-up as scheduled with Marjie Skiff, PA-C.  Today he denies chest pain, shortness of breath, lower extremity edema, fatigue, palpitations, melena, hematuria, hemoptysis, diaphoresis, weakness, presyncope, syncope, orthopnea, and PND.   Home Medications    Prior to Admission medications   Medication Sig Start Date End Date Taking? Authorizing Provider  acetaminophen (TYLENOL) 500 MG tablet Take 1,000 mg by mouth every 6 (six) hours as needed for moderate pain or headache.    [provider]  albuterol (VENTOLIN HFA) 108 (90 Base) MCG/ACT inhaler Inhale 2 puffs into the lungs every 6 (six) hours as needed for wheezing or shortness of breath. 03/03/20   Charlott Holler, MD  amoxicillin (AMOXIL) 500 MG tablet Take all 4 tablets 1 hour prior to dental work 03/29/20   [name], MD  aspirin EC 81 MG tablet Take 81 mg by mouth daily.     [provider]   aspirin-acetaminophen-caffeine (EXCEDRIN MIGRAINE) [phone] MG tablet Take by mouth every 6 (six) hours as needed for headache.    [provider]  bismuth subsalicylate (PEPTO BISMOL) 262 MG/15ML suspension Take 30 mLs by mouth every 6 (six) hours as needed for indigestion or diarrhea or loose stools.    [provider]  Metoprolol Succinate 25 MG CS24 Take 1 tablet by mouth daily. 03/14/20   [name], MD    Family History    Family History  Problem Relation Age of Onset  . Migraines Mother   . Migraines Brother   . Heart murmur Brother   . Asthma Neg Hx    He indicated that his mother is alive. He indicated that his father is alive. He indicated that both of his brothers are alive. He indicated that his maternal grandmother is deceased. He indicated that his maternal grandfather is deceased. He indicated that his paternal grandmother is deceased. He indicated that his paternal grandfather is deceased. He indicated that his daughter is alive. He indicated that the status of his neg hx is unknown.  Social History    Social History   Socioeconomic History  . Marital status: Married    Spouse name: Sirena  . Number of children: 2  . Years of education: 33  . Highest education level: Some college, no degree  Occupational History  . Not on file  Tobacco Use  . Smoking status: Never Smoker  . Smokeless tobacco: Former Engineer, water and Sexual Activity  . Alcohol use: Yes    Alcohol/week: 0.0 standard drinks    Comment: socially  . Drug use: No  . Sexual activity: Yes    Birth control/protection: Condom  Other Topics Concern  . Not on file  Social History Narrative   Lives with wife, children   Caffeine- coffee, 1 c, 2-3 cans soda a day   Social Determinants of Health   Financial Resource Strain: Not on file  Food Insecurity: Not on file  Transportation Needs: Not on file  Physical Activity: Not on file  Stress: Not on file  Social  Connections: Not on file  Intimate Partner Violence: Not on file     Review of Systems    General:  No chills, fever, night sweats or weight changes.  Cardiovascular:  No chest pain, dyspnea on exertion, edema, orthopnea, palpitations, paroxysmal nocturnal dyspnea. Dermatological: No rash, lesions/masses Respiratory: No cough, dyspnea Urologic: No hematuria, dysuria Abdominal:   No nausea, vomiting, diarrhea, bright red blood per rectum, melena, or hematemesis Neurologic:  No visual changes, wkns, changes in mental status. All other systems reviewed and are otherwise negative except as noted above.  Physical Exam    VS:  BP 112/74   Pulse 77   Ht 5\' 5"  (1.651 m)   Wt 236 lb 3.2 oz (107.1 kg)   SpO2 98%   BMI 39.31 kg/m  , BMI Body mass index is 39.31 kg/m. GEN: Well nourished, well developed, in no acute distress. HEENT: normal. Neck: Supple, no JVD, carotid bruits, or masses. Cardiac: RRR, no murmurs, rubs, or gallops. No clubbing, cyanosis, edema.  Radials/DP/PT 2+ and equal bilaterally.  Respiratory:  Respirations regular and unlabored, clear to auscultation bilaterally. GI: Soft, nontender, nondistended, BS + x 4. MS: no deformity or atrophy. Skin: warm and dry, no rash. Neuro:  Strength and sensation are intact. Psych: Normal affect.  Accessory Clinical Findings    Recent Labs: 02/27/2020: BUN 22; Creatinine, Ser 1.14; Hemoglobin 16.9; Platelets 211; Potassium 3.7; Sodium 139 04/01/2020: NT-Pro BNP 65   Recent Lipid Panel    Component Value Date/Time   TRIG 119 12/29/2013 2200    ECG personally reviewed by me today-none today.  EKG 04/01/2020 V-paced rhythm with underlying sinus 65 bpm  Echocardiogram 03/16/2020 Impressions: 1. Left ventricular ejection fraction, by estimation, is 45 to 50%. The  left ventricle has mildly decreased function. The left ventricle  demonstrates regional wall motion abnormalities with septal hypokinesis.  Left ventricular  diastolic parameters are  consistent with Grade II diastolic dysfunction (pseudonormalization). No  residual VSD flow noted (s/p VSD repair).  2. Right ventricular systolic function is mildly reduced. The right  ventricular size is normal. There is normal pulmonary artery systolic  pressure. The estimated right ventricular systolic pressure is 24.7 mmHg.  3. The mitral valve is normal in structure. Trivial mitral valve  regurgitation. No evidence of mitral stenosis.  4. Human allograft aortic root and valve replacement. Mean gradient 19  mmHg, mildly elevated. Mild aortic insufficiency.  5. Status post tricuspid valve repair. Trivial tricuspid regurgitation.  Mean gradient 5 mmHg, mildly elevated.  6. The inferior vena cava is normal in size with greater than 50%  respiratory variability, suggesting right atrial pressure of 3 mmHg.     Assessment & Plan   1.  Dizziness-reports he is feeling  better since he was last seen in the office.  Denies episodes of dizziness and lightheadedness.  Echocardiogram details above.  Previous head CT was unremarkable.  No MRI due to AVR/PPM not being compatible.  05/14/2020, PA-C working with Dr. Marjie Skiff about other options for imaging. Increase p.o. hydration Liberalize salt in diet  Increase physical activity as tolerated  Palpitations-status post Medtronic PPM due to complete heart block.  Last interrogation 04/03/2020, showed device was functioning normally.  Previously discussed the possibility of switching from metoprolol to carvedilol. Continue metoprolol Avoid triggers caffeine, chocolate, EtOH, dehydration etc. Increase physical activity as tolerated  Nonischemic cardiomyopathy-no increased activity intolerance or DOE today.  Seen by pulmonology, awaiting PFTs.  Echocardiogram results above. Continue metoprolol Heart healthy low-sodium diet-salty 6 given Increase physical activity as tolerated  Intermittent snoring/daytime  somnolence/fatigue-referred for sleep study.  Will await study results.  Possible anxiety/depression-mood appropriate today.  Reports he is frustrated because he is out  of shape.   Increase physical activity as tolerated Mindfulness stress reduction sheet given  Disposition: Follow-up with Dr. Jens Som or Rosalyn Gess as scheduled.  Thomasene Ripple. Elizebeth Kluesner NP-C    04/11/2020, 2:21 PM [facility] [address] Office [phone] Fax [phone]  Notice: This dictation was prepared with Dragon dictation along with smaller phrase technology. Any transcriptional errors that result from this process are unintentional and may not be corrected upon review.  I spent 20 minutes examining this patient, reviewing medications, and using patient centered shared decision making involving her cardiac care.  Prior to her visit I spent greater than 20 minutes reviewing her past medical history,  medications, and prior cardiac tests.

## 2020-04-11 ENCOUNTER — Other Ambulatory Visit: Payer: Self-pay

## 2020-04-11 ENCOUNTER — Encounter: Payer: Self-pay | Admitting: General Practice

## 2020-04-11 ENCOUNTER — Ambulatory Visit (INDEPENDENT_AMBULATORY_CARE_PROVIDER_SITE_OTHER): Payer: 59 | Admitting: General Practice

## 2020-04-11 ENCOUNTER — Encounter: Payer: Self-pay | Admitting: Primary Care

## 2020-04-11 ENCOUNTER — Ambulatory Visit: Payer: 59 | Admitting: Internal Medicine

## 2020-04-11 ENCOUNTER — Ambulatory Visit: Payer: 59 | Admitting: Primary Care

## 2020-04-11 VITALS — BP 112/74 | HR 77 | Ht 65.0 in | Wt 236.2 lb

## 2020-04-11 VITALS — BP 120/72 | HR 71 | Temp 97.6°F | Ht 65.0 in | Wt 236.0 lb

## 2020-04-11 DIAGNOSIS — R42 Dizziness and giddiness: Secondary | ICD-10-CM | POA: Diagnosis not present

## 2020-04-11 DIAGNOSIS — F419 Anxiety disorder, unspecified: Secondary | ICD-10-CM

## 2020-04-11 DIAGNOSIS — R0602 Shortness of breath: Secondary | ICD-10-CM

## 2020-04-11 DIAGNOSIS — R002 Palpitations: Secondary | ICD-10-CM | POA: Diagnosis not present

## 2020-04-11 DIAGNOSIS — R5383 Other fatigue: Secondary | ICD-10-CM

## 2020-04-11 DIAGNOSIS — I428 Other cardiomyopathies: Secondary | ICD-10-CM

## 2020-04-11 DIAGNOSIS — R400 Somnolence: Secondary | ICD-10-CM

## 2020-04-11 LAB — PULMONARY FUNCTION TEST
DL/VA % pred: 118 %
DL/VA: 5.9 ml/min/mmHg/L
DLCO cor % pred: 116 %
DLCO cor: 32.29 ml/min/mmHg
DLCO unc % pred: 123 %
DLCO unc: 34.21 ml/min/mmHg
FEF 25-75 Post: 3.84 L/sec
FEF 25-75 Pre: 3.51 L/sec
FEF2575-%Change-Post: 9 %
FEF2575-%Pred-Post: 97 %
FEF2575-%Pred-Pre: 89 %
FEV1-%Change-Post: 1 %
FEV1-%Pred-Post: 91 %
FEV1-%Pred-Pre: 89 %
FEV1-Post: 3.46 L
FEV1-Pre: 3.42 L
FEV1FVC-%Change-Post: 3 %
FEV1FVC-%Pred-Pre: 100 %
FEV6-%Change-Post: -2 %
FEV6-%Pred-Post: 89 %
FEV6-%Pred-Pre: 91 %
FEV6-Post: 4.08 L
FEV6-Pre: 4.17 L
FEV6FVC-%Pred-Post: 101 %
FEV6FVC-%Pred-Pre: 101 %
FVC-%Change-Post: -2 %
FVC-%Pred-Post: 87 %
FVC-%Pred-Pre: 89 %
FVC-Post: 4.08 L
FVC-Pre: 4.17 L
Post FEV1/FVC ratio: 85 %
Post FEV6/FVC ratio: 100 %
Pre FEV1/FVC ratio: 82 %
Pre FEV6/FVC Ratio: 100 %
RV % pred: 112 %
RV: 1.57 L
TLC % pred: 97 %
TLC: 5.75 L

## 2020-04-11 NOTE — Assessment & Plan Note (Signed)
-   Dyspnea improved on its on. He has a cough after he eats and has noticed change to vocal quality. No difference with SABA.  - Pulmonary function testing today showed normal spirometry. No evidence of asthma or COPD. Flow volume loop showed evidence of possible variable extrathoracic obstruction, potatially VCD. I will discuss with Dr. Celine Mans, would recommend referral to ENT and consider imaging with HRCT or bronchoscopy/largynoscopy

## 2020-04-11 NOTE — Progress Notes (Signed)
 @Patient  ID: [name], male    DOB: 07/22/1985, 35 y.o.   MRN: [id number]  Chief Complaint  Patient presents with  . Follow-up    SOB is better    Referring provider: Joycelyn RuaMeyers, Stephen, MD  HPI: 35 year old male, never smoked (hx vaping and chewing tobacco). PMH significant for bacterial endocarditis of aortic valve in 2015, s/p VSD and PFO closure with TVR, congestive dilated cardiomyopathy, bradycardia, pacemaker. Patient of Dr. Celine Mansesai, seen for consult on 03/03/20 for dyspnea evaluation.   Previous [facility] pulmonary encounter:  03/03/20-  Dr. Celine Mansesai, consult  [name] is a 35 y.o. gentleman with a complicated medical history stemming from an episode of bacterial endocarditis in 2015.  He is s/p AVR, fistula repair,  large VSD and had repair of VSD, with redo-sternotomy, tricuspid valve repair and redo PFO closure in 2016. He has a PPM in for complete heart block.  He denies any childhood respiratory disease, asthma, allergies.  There is no significant family history of either of these conditions.  Notes shortness of breath with difficulty getting a deep breath in for the last two weeks. He has difficulty getting a deep breath in. Gets worse with exertion.  Associated with being lightheaded and dizzy. Denies cough, chest tightness or wheezing.  No nocturnal association with the symptoms.  He denies excessive fatigue. Checks his oxygen levels at home and says they are over 92%.   He was seen in the ED recently for chest pain and a CT PE study was obtained as well as rule out MI protocol.  He was negative for PE and MI.  CT PE study did show some mosaic attenuation and the ED provider discussed with him that there could be some evidence of lung disease and he should see a pulmonologist.  He has since seen cardiology as well as infectious disease.  Repeat echocardiogram is pending given the worsening of his shortness of breath over the last 2 weeks.  Social history: Occupation: works for [employer] [place]  [employer].  Exposures: previous vaping, no ongoing smoke exposure  Independent interpretation of tests: Imaging: . Review of patient's CT Chest Feb 28 2020 images revealed no PE, mosaic attentuation. The patient's images have been independently reviewed by me.    PFTs: None on file   04/11/2020 Patient presents today for 6 week follow-up with PFTs. Previous hx vaping and chewing tobacco. He has a medical history significant for subacute bacterial endocarditis in 2015 with AVR, s/p VSD and PFO closure with TVR, prior heart failure with preserved EF. He was seen for initial consult by Dr. Celine Mansesai on 03/03/20 for dyspnea at rest with difficulty taking deep breath x 2 weeks. He had some evidence of mosaic attenuation on CT imaging. Symptoms not felt to be in consistent with asthma, however, he was given trial Albuterol Hfa to see if there is any improvement in his symptoms. He also has some underlying anxiety and was recommended he seek out therapy options.    He is doing well, reports shortness of breath has improved. He did not have a hard time with PFTs today but may have not understood instructions fully. He is not experiencing the degree of symptoms that he was previously. He did not notice any improvement with Albuterol rescue inhaler, tried this 8-10 times without any noticeable difference. He has noticed change in vocal quality, mainly when he makes funny voices playing with his daughter. He does not have a persistent daily cough but he does  coughs up clear mucus almost every time after he eats.   Pulmonary function testing: 04/11/2020 - FVC 4.08 (87%), FEV1 3.46 (91%), ratio 85, TLC 97%, DLCOcor 32.29 (116%) Normal spirometry without bronchodilator response     Allergies  Allergen Reactions  . Cefepime Other (See Comments)    Leukopenia not clear if due to vancomycin vs cefepime. WBC dropped down  . Vancomycin Other (See Comments)    ? Drug induced leukopenia vs being due to  cefepime. WBC dropped down    Immunization History  Administered Date(s) Administered  . Influenza,inj,Quad PF,6+ Mos 11/15/2014, 12/07/2015, 12/06/2017  . PPD Test 06/29/2016  . Tdap 10/27/2012, 07/25/2015    Past Medical History:  Diagnosis Date  . AKI (acute kidney injury) (HCC)   . Aortic valve vegetation on echo 04/29/14 04/29/2014   managed medically  . Brain abscess   . Chest pain 03/02/2020  . Congestive dilated cardiomyopathy (HCC) 06/23/2015  . Drug-induced leukopenia (HCC) 06/15/2014  . Endocarditis of aortic valve-November 2015   . Fracture of left femur (HCC)   . History of open heart surgery    December 30, 2013- ascending aortic root replacement, TEE  . Lightheaded 03/02/2020  . Meningitis    Group B Strep (November 2015)  . Migraine headache 10/15/2019   since 2008  . Obesity   . Patent foramen ovale    Closed during procedure on December 30, 2013  . Presence of permanent cardiac pacemaker 01/04/2014   PPM MEDTRONIC FOR COMPLETE HEART BLOCK  . 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 S/P aortic root and valve allograft 12/30/2013   Human allograft aortic root replacement with repair of aorta-right atrial fistula and tricuspid valve repair  . S/P placement of cardiac pacemaker 01/04/2014   Medtronic (serial number [id number]) dual chamber permanent pacemaker implanted by Dr Ladona Ridgel  . S/P redo patent foramen ovale closure 02/12/2014  . S/P redo tricuspid valve repair 02/12/2014   Complex valvuloplasty including patch closure of VSD with plication of septal leaflet and 26 mm Edwards mc3 ring annuloplasty  . S/P ventricular septal defect repair + redo tricuspid valve repair + redo closure PFO 02/12/2014   Redo sternotomy for bovine pericardial patch repair of large ventricular septal defect (recurrent LVOT to RA fistula due to bacterial endocarditis)  . Smokeless tobacco use   . Third degree heart block (HCC) 12/31/2013  . Thrombocytopenia (HCC) 12/2013  .  Tricuspid regurgitation 02/11/2014  . Ventricular septal defect 02/12/2014   Post-infectious s/p repair of LVOT to RA fistula for bacterial endocarditis    Tobacco History: Social History   Tobacco Use  Smoking Status Never Smoker  Smokeless Tobacco Former Neurosurgeon   Counseling given: Yes   Outpatient Medications Prior to Visit  Medication Sig Dispense Refill  . acetaminophen (TYLENOL) 500 MG tablet Take 1,000 mg by mouth every 6 (six) hours as needed for moderate pain or headache.    . albuterol (VENTOLIN HFA) 108 (90 Base) MCG/ACT inhaler Inhale 2 puffs into the lungs every 6 (six) hours as needed for wheezing or shortness of breath. 8 g 2  . amoxicillin (AMOXIL) 500 MG tablet Take all 4 tablets 1 hour prior to dental work 4 tablet 6  . aspirin EC 81 MG tablet Take 81 mg by mouth daily.     Marland Kitchen aspirin-acetaminophen-caffeine (EXCEDRIN MIGRAINE) 250-250-65 MG tablet Take by mouth every 6 (six) hours as needed for headache.    . bismuth subsalicylate (PEPTO BISMOL) 262 MG/15ML suspension Take 30 mLs by  mouth every 6 (six) hours as needed for indigestion or diarrhea or loose stools.    . Metoprolol Succinate 25 MG CS24 Take 1 tablet by mouth daily. 30 capsule 11   No facility-administered medications prior to visit.   Review of Systems  Review of Systems  Constitutional: Negative.   HENT: Negative.   Respiratory: Positive for cough. Negative for chest tightness, shortness of breath and wheezing.   Cardiovascular: Negative.     Physical Exam  BP 120/72 (BP Location: Right Arm, Cuff Size: Normal)   Pulse 71   Temp 97.6 F (36.4 C) (Oral)   Ht 5\' 5"  (1.651 m)   Wt 236 lb (107 kg)   SpO2 97%   BMI 39.27 kg/m  Physical Exam Constitutional:      Appearance: Normal appearance.  HENT:     Head: Normocephalic and atraumatic.     Mouth/Throat:     Mouth: Mucous membranes are moist.     Pharynx: Oropharynx is clear.  Cardiovascular:     Rate and Rhythm: Normal rate and regular  rhythm.     Heart sounds: Murmur heard.    Pulmonary:     Effort: Pulmonary effort is normal.     Breath sounds: No wheezing, rhonchi or rales.     Comments: CTA. No stridor. Clears throat at times  Musculoskeletal:        General: Normal range of motion.  Neurological:     General: No focal deficit present.     Mental Status: He is alert and oriented to person, place, and time. Mental status is at baseline.  Psychiatric:        Mood and Affect: Mood normal.        Behavior: Behavior normal.        Thought Content: Thought content normal.        Judgment: Judgment normal.      Lab Results:  CBC    Component Value Date/Time   WBC 7.9 02/27/2020 2144   RBC 5.61 02/27/2020 2144   HGB 16.9 02/27/2020 2144   HGB 16.6 10/09/2019 1616   HCT 48.9 02/27/2020 2144   HCT 47.4 10/09/2019 1616   PLT 211 02/27/2020 2144   PLT 203 10/09/2019 1616   MCV 87.2 02/27/2020 2144   MCV 87 10/09/2019 1616   MCH 30.1 02/27/2020 2144   MCHC 34.6 02/27/2020 2144   RDW 12.4 02/27/2020 2144   RDW 14.0 10/09/2019 1616   LYMPHSABS 1,340 11/05/2019 1136   LYMPHSABS 1.8 10/09/2019 1616   MONOABS 913 12/07/2015 1630   EOSABS 360 11/05/2019 1136   EOSABS 0.1 10/09/2019 1616   BASOSABS 41 11/05/2019 1136   BASOSABS 0.1 10/09/2019 1616    BMET    Component Value Date/Time   NA 139 02/27/2020 2144   NA 142 10/08/2019 1623   K 3.7 02/27/2020 2144   CL 105 02/27/2020 2144   CO2 25 02/27/2020 2144   GLUCOSE 106 (H) 02/27/2020 2144   BUN 22 (H) 02/27/2020 2144   BUN 22 (H) 10/08/2019 1623   CREATININE 1.14 02/27/2020 2144   CREATININE 1.32 06/10/2017 1407   CALCIUM 9.7 02/27/2020 2144   GFRNONAA >60 02/27/2020 2144   GFRNONAA 72 06/10/2017 1407   GFRAA 102 10/08/2019 1623   GFRAA 84 06/10/2017 1407    BNP    Component Value Date/Time   BNP 617.2 (H) 02/11/2014 1300    ProBNP    Component Value Date/Time   PROBNP 65 04/01/2020 1026  Imaging: ECHOCARDIOGRAM COMPLETE  Result  Date: 03/16/2020    ECHOCARDIOGRAM REPORT   Patient Name:   AUNDRA CARLTON   Date of Exam: 03/16/2020 Medical Rec #:  [id number]     Height:       65.0 in Accession #:    [id number]    Weight:       231.8 lb Date of Birth:  02-12-1986      BSA:          2.106 m Patient Age:    34 years      BP:           118/72 mmHg Patient Gender: M             HR:           74 bpm. Exam Location:  [address] Procedure: 2D Echo, 3D Echo, Cardiac Doppler and Color Doppler Indications:    Z95.2 Status Post Aortic Valve Replacement  History:        Patient has prior history of Echocardiogram examinations, most                 recent 10/09/2019. CHF, Pacemaker, Endocarditis and Aortic Valve                 Disease, Arrythmias:3rd Degree Heart Block;                 Signs/Symptoms:Shortness of Breath. Bacterial endocarditis in                 2015. He is s/p AVR, fistula repair, large VSD and had repair of                 VSD, with redo-sternotomy, tricuspid valve repair and redo PFO                 closure in 2016. He has a PPM in for complete heart block.  Sonographer:    Farrel Conners RDCS Referring Phys: [id number] BRIAN S CRENSHAW IMPRESSIONS  1. Left ventricular ejection fraction, by estimation, is 45 to 50%. The left ventricle has mildly decreased function. The left ventricle demonstrates regional wall motion abnormalities with septal hypokinesis. Left ventricular diastolic parameters are consistent with Grade II diastolic dysfunction (pseudonormalization). No residual VSD flow noted (s/p VSD repair).  2. Right ventricular systolic function is mildly reduced. The right ventricular size is normal. There is normal pulmonary artery systolic pressure. The estimated right ventricular systolic pressure is 24.7 mmHg.  3. The mitral valve is normal in structure. Trivial mitral valve regurgitation. No evidence of mitral stenosis.  4. Human allograft aortic root and valve replacement. Mean gradient 19 mmHg, mildly elevated. Mild aortic  insufficiency.  5. Status post tricuspid valve repair. Trivial tricuspid regurgitation. Mean gradient 5 mmHg, mildly elevated.  6. The inferior vena cava is normal in size with greater than 50% respiratory variability, suggesting right atrial pressure of 3 mmHg. FINDINGS  Left Ventricle: Left ventricular ejection fraction, by estimation, is 45 to 50%. The left ventricle has mildly decreased function. The left ventricle demonstrates regional wall motion abnormalities. The left ventricular internal cavity size was normal in size. There is no left ventricular hypertrophy. Left ventricular diastolic parameters are consistent with Grade II diastolic dysfunction (pseudonormalization). Right Ventricle: The right ventricular size is normal. No increase in right ventricular wall thickness. Right ventricular systolic function is mildly reduced. There is normal pulmonary artery systolic pressure. The tricuspid regurgitant velocity is 2.33 m/s, and with an assumed right atrial  pressure of 3 mmHg, the estimated right ventricular systolic pressure is 24.7 mmHg. Left Atrium: Left atrial size was normal in size. Right Atrium: Right atrial size was normal in size. Pericardium: There is no evidence of pericardial effusion. Mitral Valve: The mitral valve is normal in structure. Trivial mitral valve regurgitation. No evidence of mitral valve stenosis. Tricuspid Valve: Status post tricuspid valve repair. Trivial tricuspid regurgitation. Mean gradient 5 mmHg, mildly elevated. The tricuspid valve is normal in structure. Tricuspid valve regurgitation is trivial. Aortic Valve: Human allograft aortic root and valve replacement. Mean gradient 19 mmHg, mildly elevated. Mild aortic insufficiency. The aortic valve has been repaired/replaced. Aortic valve regurgitation is mild. Aortic valve mean gradient measures 17.3 mmHg. Aortic valve peak gradient measures 33.0 mmHg. Aortic valve area, by VTI measures 1.72 cm. Pulmonic Valve: The pulmonic  valve was normal in structure. Pulmonic valve regurgitation is not visualized. Aorta: The aortic root is normal in size and structure. Venous: The inferior vena cava is normal in size with greater than 50% respiratory variability, suggesting right atrial pressure of 3 mmHg. IAS/Shunts: No atrial level shunt detected by color flow Doppler. Additional Comments: A pacer wire is visualized in the right ventricle.  LEFT VENTRICLE PLAX 2D LVIDd:         4.30 cm  Diastology LVIDs:         3.10 cm  LV e' medial:    6.42 cm/s LV PW:         0.90 cm  LV E/e' medial:  12.4 LV IVS:        0.70 cm  LV e' lateral:   12.30 cm/s LVOT diam:     2.90 cm  LV E/e' lateral: 6.5 LV SV:         105 LV SV Index:   50 LVOT Area:     6.61 cm                          3D Volume EF:                         3D EF:        48 %                         LV EDV:       153 ml                         LV ESV:       80 ml                         LV SV:        73 ml RIGHT VENTRICLE RV S prime:     8.81 cm/s TAPSE (M-mode): 1.3 cm LEFT ATRIUM             Index       RIGHT ATRIUM           Index LA diam:        5.00 cm 2.37 cm/m  RA Area:     13.70 cm LA Vol (A2C):   66.3 ml 31.48 ml/m RA Volume:   30.40 ml  14.43 ml/m LA Vol (A4C):   54.2 ml 25.73 ml/m LA Biplane Vol: 60.0 ml 28.49 ml/m  AORTIC VALVE AV Area (Vmax):    1.61  cm AV Area (Vmean):   1.60 cm AV Area (VTI):     1.72 cm AV Vmax:           287.33 cm/s AV Vmean:          192.667 cm/s AV VTI:            0.611 m AV Peak Grad:      33.0 mmHg AV Mean Grad:      17.3 mmHg LVOT Vmax:         70.00 cm/s LVOT Vmean:        46.700 cm/s LVOT VTI:          0.159 m LVOT/AV VTI ratio: 0.26  AORTA Ao Root diam: 3.30 cm Ao Asc diam:  3.00 cm MITRAL VALVE               TRICUSPID VALVE MV Area (PHT): cm         TR Peak grad:   21.7 mmHg MV Decel Time: 183 msec    TR Vmax:        233.00 cm/s MV E velocity: 79.90 cm/s MV A velocity: 56.90 cm/s  SHUNTS MV E/A ratio:  1.40        Systemic VTI:  0.16 m                             Systemic Diam: 2.90 cm [name] MD Electronically signed by [name] MD Signature Date/Time: 03/16/2020/5:57:29 PM    Final    CUP PACEART REMOTE DEVICE CHECK  Result Date: 04/04/2020 Scheduled remote reviewed. Normal device function.  Next remote 91 days. HB    Assessment & Plan:   Dyspnea - Dyspnea improved on its on. He has a cough after he eats and has noticed change to vocal quality. No difference with SABA.  - Pulmonary function testing today showed normal spirometry. No evidence of asthma or COPD. Flow volume loop showed evidence of possible variable extrathoracic obstruction, potatially VCD. I will discuss with Dr. Celine Mans, would recommend referral to ENT and consider imaging with HRCT or bronchoscopy/largynoscopy    Glenford Bayley, NP 04/11/2020

## 2020-04-11 NOTE — Patient Instructions (Signed)
Medication Instructions:  The current medical regimen is effective;  continue present plan and medications as directed. Please refer to the Current Medication list given to you today.  *If you need a refill on your cardiac medications before your next appointment, please call your pharmacy*  Lab Work:   Testing/Procedures:  NONE    NONE  Special Instructions PLEASE READ AND FOLLOW STRESS REDUCTION TIPS-ATTACHED  PLEASE INCREASE PHYSICAL ACTIVITY AS TOLERATED  Follow-Up: Your next appointment:  KEEP SCHEDULED APPOINTMENT  In Person with  Marjie Skiff, PA-C.   At [facility], you and your health needs are our priority.  As part of our continuing mission to provide you with exceptional heart care, we have created designated Provider Care Teams.  These Care Teams include your primary Cardiologist (physician) and Advanced Practice Providers (APPs -  Physician Assistants and Nurse Practitioners) who all work together to provide you with the care you need, when you need it.    Mindfulness-Based Stress Reduction Mindfulness-based stress reduction (MBSR) is a program that helps people learn to practice mindfulness. Mindfulness is the practice of intentionally paying attention to the present moment. MBSR focuses on developing self-awareness, which allows you to respond to life stress without judgment or negative emotions. It can be learned and practiced through techniques such as education, breathing exercises, meditation, and yoga. MBSR includes several mindfulness techniques in one program. MBSR works best when you understand the treatment, are willing to try new things, and can commit to spending time practicing what you learn. MBSR training may include learning about:  How your emotions, thoughts, and reactions affect your body.  New ways to respond to things that cause negative thoughts to start (triggers).  How to notice your thoughts and let go of them.  Practicing awareness of  everyday things that you normally do without thinking.  The techniques and goals of different types of meditation. What are the benefits of MBSR? MBSR can have many benefits, which include helping you to:  Develop self-awareness. This refers to knowing and understanding yourself.  Learn skills and attitudes that help you to participate in your own health care.  Learn new ways to care for yourself.  Be more accepting about how things are, and let things go.  Be less judgmental and approach things with an open mind.  Be patient with yourself and trust yourself more. MBSR has also been shown to:  Reduce negative emotions, such as depression and anxiety.  Improve memory and focus.  Change how you sense and approach pain.  Boost your body's ability to fight infections.  Help you connect better with other people.  Improve your sense of well-being. Follow these instructions at home:  Find a local in-person or online MBSR program.  Set aside some time regularly for mindfulness practice.  Find a mindfulness practice that works best for you. This may include one or more of the following: ? Meditation. Meditation involves focusing your mind on a certain thought or activity. ? Breathing awareness exercises. These help you to stay present by focusing on your breath. ? Body scan. For this practice, you lie down and pay attention to each part of your body from head to toe. You can identify tension and soreness and intentionally relax parts of your body. ? Yoga. Yoga involves stretching and breathing, and it can improve your ability to move and be flexible. It can also provide an experience of testing your body's limits, which can help you release stress. ? Mindful eating. This  way of eating involves focusing on the taste, texture, color, and smell of each bite of food. Because this slows down eating and helps you feel full sooner, it can be an important part of a weight-loss plan.  Find a  podcast or recording that provides guidance for breathing awareness, body scan, or meditation exercises. You can listen to these any time when you have a free moment to rest without distractions.  Follow your treatment plan as told by your health care provider. This may include taking regular medicines and making changes to your diet or lifestyle as recommended.   How to practice mindfulness To do a basic awareness exercise:  Find a comfortable place to sit.  Pay attention to the present moment. Observe your thoughts, feelings, and surroundings just as they are.  Avoid placing judgment on yourself, your feelings, or your surroundings. Make note of any judgment that comes up, and let it go.  Your mind may wander, and that is okay. Make note of when your thoughts drift, and return your attention to the present moment. To do basic mindfulness meditation:  Find a comfortable place to sit. This may include a stable chair or a firm floor cushion. ? Sit upright with your back straight. Let your arms fall next to your side with your hands resting on your legs. ? If sitting in a chair, rest your feet flat on the floor. ? If sitting on a cushion, cross your legs in front of you.  Keep your head in a neutral position with your chin dropped slightly. Relax your jaw and rest the tip of your tongue on the roof of your mouth. Drop your gaze to the floor. You can close your eyes if you like.  Breathe normally and pay attention to your breath. Feel the air moving in and out of your nose. Feel your belly expanding and relaxing with each breath.  Your mind may wander, and that is okay. Make note of when your thoughts drift, and return your attention to your breath.  Avoid placing judgment on yourself, your feelings, or your surroundings. Make note of any judgment or feelings that come up, let them go, and bring your attention back to your breath.  When you are ready, lift your gaze or open your eyes. Pay  attention to how your body feels after the meditation. Where to find more information You can find more information about MBSR from:  Your health care provider.  Community-based meditation centers or programs.  Programs offered near you. Summary  Mindfulness-based stress reduction (MBSR) is a program that teaches you how to intentionally pay attention to the present moment. It is used with other treatments to help you cope better with daily stress, emotions, and pain.  MBSR focuses on developing self-awareness, which allows you to respond to life stress without judgment or negative emotions.  MBSR programs may involve learning different mindfulness practices, such as breathing exercises, meditation, yoga, body scan, or mindful eating. Find a mindfulness practice that works best for you, and set aside time for it on a regular basis. This information is not intended to replace advice given to you by your health care provider. Make sure you discuss any questions you have with your health care provider. Document Revised: 10/16/2019 Document Reviewed: 10/16/2019 Elsevier Patient Education  2021 [employer].

## 2020-04-11 NOTE — Patient Instructions (Signed)
Pulmonary function testing showed normal spirometry and lung volumes/ No evidence of asthma or COPD.  Your flow volume loops showed possible obstruction above vocal cords, this could be due to difficulties you had with test instructions or trachea/vocal cord issues. I would recommend referral to ENT and potentially getting a high resolution CT chest. I will discuss plan with Dr. Celine Mans. You can stop using Albuterol as it did not help.   Follow-up: 6 months with Dr. Celine Mans or sooner if needed

## 2020-04-12 ENCOUNTER — Telehealth: Payer: Self-pay | Admitting: Diagnostic Neuroimaging

## 2020-04-12 ENCOUNTER — Encounter: Payer: Self-pay | Admitting: Diagnostic Neuroimaging

## 2020-04-12 ENCOUNTER — Ambulatory Visit: Payer: 59 | Admitting: Diagnostic Neuroimaging

## 2020-04-12 VITALS — BP 113/69 | HR 63 | Ht 65.0 in | Wt 238.0 lb

## 2020-04-12 DIAGNOSIS — H539 Unspecified visual disturbance: Secondary | ICD-10-CM

## 2020-04-12 MED ORDER — ALPRAZOLAM 0.5 MG PO TABS: ORAL_TABLET | ORAL | 0 refills | Status: DC

## 2020-04-12 NOTE — Telephone Encounter (Signed)
Noted  

## 2020-04-12 NOTE — Addendum Note (Signed)
Addended by: Joycelyn Schmid R on: 04/12/2020 04:57 PM   Modules accepted: Orders

## 2020-04-12 NOTE — Addendum Note (Signed)
Addended by: [place] Pink on: 04/12/2020 03:53 PM   Modules accepted: Orders

## 2020-04-12 NOTE — Progress Notes (Signed)
[facility]  PATIENT: [name] DOB: 01/19/1986  REFERRING CLINICIAN: Joycelyn RuaMeyers, Stephen, MD HISTORY FROM: patient  REASON FOR VISIT: follow up  HISTORICAL  CHIEF COMPLAINT:  Chief Complaint  Patient presents with  . Migraine    Rm 6 "still get migraine auras but no headache, weakness and nausea; now everything seems brighter, see flashes/floaters-eye dr says everything ok, cardiology and pulmonologist checked out ok"     HISTORY OF PRESENT ILLNESS:   UPDATE (04/12/20, VRP): Since last visit, having 1 month of vision disturbance (seeing static when eyes closed or in dark environment; photosensitive). Symptoms are constant. Also some anxiety issues. Has had chest pain and breathing issues. Went to cardiology and pulmonology.   PRIOR HPI (01/01/20): 35 year old male here for evaluation of numbness and headaches.  Patient has history of migraine headaches since 2008 with left face and left hand numbness and tingling, blurred vision, visual disturbance, frontal and occipital headaches.  Sometimes nausea, sensitivity light and sound.  Patient was having 1 headache per month, managed with over-the-counter medications.  Family history of migraine in his mother.  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  He underwent human allograft aortic root and valve replacement, repair of aorta to right atrium fistula and tricuspid valve repair and PFO closure surgeries on 12/30/2013.  He developed postoperative heart block requiring pacemaker.  Several weeks later he developed acute heart failure, requiring redo sternotomy, bovine patch repair of VSD, tricuspid valve repair, PFO closure.  Patient has been managed  by cardiology infectious disease since that time.  Interestingly following his diagnosis of endocarditis, meningitis and heart surgeries, patient continued with migraine phenomenon of numbness and tingling and vision changes but his headaches seem to resolve.  August 2021, the day after his second child was delivered via C-section, patient had onset of dizziness, numbness, headaches, vision changes.  Patient followed up with cardiology and ID clinic.  He had CT scan of the head and lumbar puncture which showed no evidence of recurrent meningitis or infection.  Past few weeks symptoms have resolved.  He continues to have rare episodes of unilateral numbness, similar to his prior migraine events.  A few days ago he was under increased stress related to a family member and following that he developed some migraine headache symptoms.   REVIEW OF SYSTEMS: Full 14 system review of systems performed and negative with exception of: As per HPI.  ALLERGIES: Allergies  Allergen Reactions  . Cefepime Other (See Comments)    Leukopenia not clear if due to vancomycin vs cefepime. WBC dropped down  . Vancomycin Other (See Comments)    ? Drug induced leukopenia vs being due to cefepime. WBC dropped down    HOME MEDICATIONS: Outpatient Medications Prior to Visit  Medication Sig Dispense Refill  . acetaminophen (TYLENOL) 500 MG tablet Take 1,000 mg by mouth every 6 (six) hours as needed for moderate pain or headache.    . albuterol (VENTOLIN HFA) 108 (90 Base) MCG/ACT inhaler Inhale 2 puffs into the lungs every 6 (six) hours as needed for wheezing or shortness of breath. 8 g 2  . aspirin EC 81 MG tablet Take 81 mg by mouth daily.     Marland Kitchen. aspirin-acetaminophen-caffeine (EXCEDRIN MIGRAINE) 250-250-65 MG tablet Take by mouth  every 6 (six) hours as needed for headache.    . bismuth subsalicylate (PEPTO BISMOL) 262 MG/15ML suspension Take 30 mLs by mouth every 6 (six) hours as needed for indigestion or diarrhea or  loose stools.    . Metoprolol Succinate 25 MG CS24 Take 1 tablet by mouth daily. 30 capsule 11  . amoxicillin (AMOXIL) 500 MG tablet Take all 4 tablets 1 hour prior to dental work (Patient not taking: Reported on 04/12/2020) 4 tablet 6   No facility-administered medications prior to visit.    PAST MEDICAL HISTORY: Past Medical History:  Diagnosis Date  . AKI (acute kidney injury) (HCC)   . Aortic valve vegetation on echo 04/29/14 04/29/2014   managed medically  . Brain abscess   . Chest pain 03/02/2020  . Congestive dilated cardiomyopathy (HCC) 06/23/2015  . Drug-induced leukopenia (HCC) 06/15/2014  . Endocarditis of aortic valve-November 2015   . Fracture of left femur (HCC)   . History of open heart surgery    December 30, 2013- ascending aortic root replacement, TEE  . Lightheaded 03/02/2020  . Meningitis    Group B Strep (November 2015)  . Migraine headache 10/15/2019   since 2008  . Obesity   . Patent foramen ovale    Closed during procedure on December 30, 2013  . Presence of permanent cardiac pacemaker 01/04/2014   PPM MEDTRONIC FOR COMPLETE HEART BLOCK  . 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 S/P aortic root and valve allograft 12/30/2013   Human allograft aortic root replacement with repair of aorta-right atrial fistula and tricuspid valve repair  . S/P placement of cardiac pacemaker 01/04/2014   Medtronic (serial number [id number]) dual chamber permanent pacemaker implanted by Dr Ladona Ridgel  . S/P redo patent foramen ovale closure 02/12/2014  . S/P redo tricuspid valve repair 02/12/2014   Complex valvuloplasty including patch closure of VSD with plication of septal leaflet and 26 mm Edwards mc3 ring annuloplasty  . S/P ventricular septal defect repair + redo tricuspid valve repair + redo closure PFO 02/12/2014   Redo sternotomy for bovine pericardial patch repair of large ventricular septal defect (recurrent LVOT to RA fistula due to bacterial endocarditis)  . Smokeless  tobacco use   . Third degree heart block (HCC) 12/31/2013  . Thrombocytopenia (HCC) 12/2013  . Tricuspid regurgitation 02/11/2014  . Ventricular septal defect 02/12/2014   Post-infectious s/p repair of LVOT to RA fistula for bacterial endocarditis    PAST SURGICAL HISTORY: Past Surgical History:  Procedure Laterality Date  . ASCENDING AORTIC ROOT REPLACEMENT N/A 12/30/2013   Procedure: ASCENDING AORTIC ROOT REPLACEMENT with 52mm HOMOGRAFT, CLOSURE OF AORTIC TO RIGHT ATRIAL FISTULA, CLOSURE OF PATENT FORAMEN OVALE;  Surgeon: Delight Ovens, MD;  Location: [facility] OR;  Service: Open Heart Surgery;  Laterality: N/A;  . INSERT / REPLACE / REMOVE PACEMAKER  01/04/2014  . INTRAOPERATIVE TRANSESOPHAGEAL ECHOCARDIOGRAM N/A 12/30/2013   Procedure: INTRAOPERATIVE TRANSESOPHAGEAL ECHOCARDIOGRAM;  Surgeon: Delight Ovens, MD;  Location: [facility] OR;  Service: Open Heart Surgery;  Laterality: N/A;  . PACEMAKER INSERTION Left 04/06/2019   Procedure: PACEMAKER LEAD INSERTION;  Surgeon: Marinus Maw, MD;  Location: [facility] OR;  Service: Cardiovascular;  Laterality: Left;  . PACEMAKER LEAD REMOVAL N/A 04/06/2019   Procedure: PACEMAKER LEAD REMOVAL;  Surgeon: Marinus Maw, MD;  Location: [facility] OR;  Service: Cardiovascular;  Laterality: N/A;  . PERMANENT PACEMAKER INSERTION N/A 01/04/2014   Procedure: PERMANENT PACEMAKER INSERTION;  Surgeon: Marinus Maw, MD;  Location: [facility] CATH LAB;  Service: Cardiovascular;  Laterality: N/A;  . RIGHT HEART CATHETERIZATION N/A 02/12/2014   Procedure: RIGHT HEART CATH;  Surgeon: Laurey Morale, MD;  Location: [facility] CATH LAB;  Service: Cardiovascular;  Laterality: N/A;  . TEE WITHOUT CARDIOVERSION N/A 02/12/2014   Procedure: TRANSESOPHAGEAL ECHOCARDIOGRAM (TEE);  Surgeon: Quintella Reichert, MD;  Location: [facility] ENDOSCOPY;  Service: Cardiovascular;  Laterality: N/A;  . TEE WITHOUT CARDIOVERSION N/A 04/30/2014   Procedure: TRANSESOPHAGEAL ECHOCARDIOGRAM (TEE);  Surgeon: Jake Bathe, MD;  Location:  [facility] ENDOSCOPY;  Service: Cardiovascular;  Laterality: N/A;  . TRICUSPID VALVE REPLACEMENT N/A 12/30/2013   Procedure: REPAIR OF TRICUSPID VALVE LEAFLET;  Surgeon: Delight Ovens, MD;  Location: [facility] OR;  Service: Open Heart Surgery;  Laterality: N/A;  . TRICUSPID VALVE REPLACEMENT N/A 02/12/2014   Procedure: TRICUSPID VALVE REPAIR, REDO MEDIAN STERNOTOMY, REPAIR OF VENTRICULAR SEPTAL DEFECT (RECURRENT LV OUTFLOW TRACT TO RIGHT ATRIAL FISTULA), REDO CLOSURE OF PATENT FORAMEN OVALE;  Surgeon: Purcell Nails, MD;  Location: [facility] OR;  Service: Open Heart Surgery;  Laterality: N/A;  . VENOGRAM Left 04/06/2019   Procedure: Venogram;  Surgeon: Marinus Maw, MD;  Location: [facility] OR;  Service: Cardiovascular;  Laterality: Left;    FAMILY HISTORY: Family History  Problem Relation Age of Onset  . Migraines Mother   . COPD Father   . Migraines Brother   . Heart murmur Brother   . Migraines Brother   . Migraines Maternal Grandmother   . Asthma Neg Hx     SOCIAL HISTORY: Social History   Socioeconomic History  . Marital status: Married    Spouse name: Sirena  . Number of children: 2  . Years of education: 60  . Highest education level: Some college, no degree  Occupational History  . Not on file  Tobacco Use  . Smoking status: Never Smoker  . Smokeless tobacco: Former Neurosurgeon    Types: Snuff  Substance and Sexual Activity  . Alcohol use: Yes    Alcohol/week: 0.0 standard drinks    Comment: socially  . Drug use: No  . Sexual activity: Yes    Birth control/protection: Condom  Other Topics Concern  . Not on file  Social History Narrative   Lives with wife, children   Caffeine- coffee, 1 c, 2-3 cans soda a day   Social Determinants of Health   Financial Resource Strain: Not on file  Food Insecurity: Not on file  Transportation Needs: Not on file  Physical Activity: Not on file  Stress: Not on file  Social Connections: Not on file  Intimate Partner Violence: Not on file      PHYSICAL EXAM  GENERAL EXAM/CONSTITUTIONAL: Vitals:  Vitals:   04/12/20 0855  BP: 113/69  Pulse: 63  Weight: 238 lb (108 kg)  Height: 5\' 5"  (1.651 m)   Body mass index is 39.61 kg/m. Wt Readings from Last 3 Encounters:  04/12/20 238 lb (108 kg)  04/11/20 236 lb (107 kg)  04/11/20 236 lb 3.2 oz (107.1 kg)    Patient is in no distress; well developed, nourished and groomed; neck is supple  CARDIOVASCULAR:  Examination of carotid arteries is normal; no carotid bruits  Regular rate and rhythm, MILD SYSTOLIC MURMUS  Examination of peripheral vascular system by observation and palpation is normal  EYES:  Ophthalmoscopic exam of optic discs and posterior segments is normal; no papilledema or hemorrhages No exam data present  MUSCULOSKELETAL:  Gait, strength, tone, movements noted in Neurologic exam below  NEUROLOGIC: MENTAL STATUS:  No flowsheet data found.  awake, alert, oriented to person, place and time  recent and remote memory intact  normal attention and concentration  language fluent, comprehension intact, naming intact  fund of knowledge appropriate  CRANIAL NERVE:   2nd - no papilledema on fundoscopic exam  2nd, 3rd, 4th, 6th - pupils equal and reactive to light, visual fields full to confrontation, extraocular muscles intact, no nystagmus  5th - facial sensation symmetric  7th - facial strength symmetric  8th - hearing intact  9th - palate elevates symmetrically, uvula midline  11th - shoulder shrug symmetric  12th - tongue protrusion midline  MOTOR:   normal bulk and tone, full strength in the BUE, BLE  SENSORY:   normal and symmetric to light touch, temperature, vibration  COORDINATION:   finger-nose-finger, fine finger movements normal  REFLEXES:   deep tendon reflexes present and symmetric  GAIT/STATION:   narrow based gait     DIAGNOSTIC DATA (LABS, IMAGING, TESTING) - I reviewed patient records, labs,  notes, testing and imaging myself where available.  Lab Results  Component Value Date   WBC 7.9 02/27/2020   HGB 16.9 02/27/2020   HCT 48.9 02/27/2020   MCV 87.2 02/27/2020   PLT 211 02/27/2020      Component Value Date/Time   NA 139 02/27/2020 2144   NA 142 10/08/2019 1623   K 3.7 02/27/2020 2144   CL 105 02/27/2020 2144   CO2 25 02/27/2020 2144   GLUCOSE 106 (H) 02/27/2020 2144   BUN 22 (H) 02/27/2020 2144   BUN 22 (H) 10/08/2019 1623   CREATININE 1.14 02/27/2020 2144   CREATININE 1.32 06/10/2017 1407   CALCIUM 9.7 02/27/2020 2144   PROT 7.2 04/29/2014 1538   ALBUMIN 4.1 04/29/2014 1538   AST 18 04/29/2014 1538   ALT 21 04/29/2014 1538   ALKPHOS 62 04/29/2014 1538   BILITOT 0.6 04/29/2014 1538   GFRNONAA >60 02/27/2020 2144   GFRNONAA 72 06/10/2017 1407   GFRAA 102 10/08/2019 1623   GFRAA 84 06/10/2017 1407   Lab Results  Component Value Date   TRIG 119 12/29/2013   Lab Results  Component Value Date   HGBA1C 5.5 04/06/2019   No results found for: VITAMINB12 Lab Results  Component Value Date   TSH 2.109 05/03/2014    12/24/13 MRI brain [I reviewed images myself and agree with interpretation. -VRP]  - Findings consistent with bacterial meningitis, cerebritis, and scattered microabscesses. Delineation of the extent of disease difficult due to patient motion.    10/29/19 CT head [I reviewed images myself and agree with interpretation. -VRP]  - Normal head CT  12/02/19 LP (opening pressure 25cm H2O) - Successful lumbar puncture using fluoroscopy - CSF WBC 2, RBC 2, PROT 34, GLUC 52; culture negative    ASSESSMENT AND PLAN  35 y.o. year old male here with:  -History of migraine has since 2008; significant medical event and complication in 2015 with endocarditis, meningitis, microabscess of the brain, complex cardiac surgeries; following this headaches improved; continue to have rare migrainous phenomenon since that time until August 2021 when symptoms  slightly changed.     Dx:  1. Vision changes     PLAN:  NEW VISION CHANGES - possibly migraine phenomenon; due to change in sxs will check MRI brain  MIGRAINE WITH AURA (HA, numbness, vision aura, sens to light / sound; since 2008) - monitor for now; may consider topiramate or amitriptyline in future if symptoms increase  Orders Placed This  Encounter  Procedures  . MR BRAIN W WO CONTRAST   Return for pending if symptoms worsen or fail to improve.    Suanne Marker, MD 04/12/2020, 9:43 AM Certified in Neurology, Neurophysiology and Neuroimaging  [facility] [address] [place], [place] [address] [phone]

## 2020-04-12 NOTE — Telephone Encounter (Signed)
[employer] Berkley Harvey: [id number] exp: 04/12/20-05/27/20  pt has a pacemaker faxed info to [facility].  Patient also stated he is claustrophobic and would like something to help him.

## 2020-04-12 NOTE — Telephone Encounter (Signed)
FYI: Pt called, I am on my way, will be there in 9 minutes. Informed Pt you would need to reschedule. Pt said,It may be can be worked out,  I will keep coming to my appt.

## 2020-04-13 ENCOUNTER — Telehealth (HOSPITAL_COMMUNITY): Payer: Self-pay

## 2020-04-13 ENCOUNTER — Encounter: Payer: Self-pay | Admitting: *Deleted

## 2020-04-14 ENCOUNTER — Other Ambulatory Visit: Payer: Self-pay

## 2020-04-14 ENCOUNTER — Ambulatory Visit: Payer: 59 | Attending: Student | Admitting: Cardiovascular Disease

## 2020-04-14 DIAGNOSIS — Z9889 Other specified postprocedural states: Secondary | ICD-10-CM | POA: Diagnosis not present

## 2020-04-14 DIAGNOSIS — R002 Palpitations: Secondary | ICD-10-CM

## 2020-04-14 DIAGNOSIS — Z952 Presence of prosthetic heart valve: Secondary | ICD-10-CM | POA: Diagnosis not present

## 2020-04-14 DIAGNOSIS — Z792 Long term (current) use of antibiotics: Secondary | ICD-10-CM | POA: Diagnosis not present

## 2020-04-14 DIAGNOSIS — G4730 Sleep apnea, unspecified: Secondary | ICD-10-CM

## 2020-04-14 DIAGNOSIS — Z7982 Long term (current) use of aspirin: Secondary | ICD-10-CM | POA: Insufficient documentation

## 2020-04-14 DIAGNOSIS — G478 Other sleep disorders: Secondary | ICD-10-CM

## 2020-04-14 DIAGNOSIS — Z79899 Other long term (current) drug therapy: Secondary | ICD-10-CM | POA: Diagnosis not present

## 2020-04-14 DIAGNOSIS — I428 Other cardiomyopathies: Secondary | ICD-10-CM | POA: Diagnosis not present

## 2020-04-14 DIAGNOSIS — I442 Atrioventricular block, complete: Secondary | ICD-10-CM

## 2020-04-14 DIAGNOSIS — R400 Somnolence: Secondary | ICD-10-CM | POA: Diagnosis present

## 2020-04-14 DIAGNOSIS — R0600 Dyspnea, unspecified: Secondary | ICD-10-CM

## 2020-04-14 DIAGNOSIS — R0683 Snoring: Secondary | ICD-10-CM | POA: Diagnosis present

## 2020-04-14 DIAGNOSIS — Z8679 Personal history of other diseases of the circulatory system: Secondary | ICD-10-CM

## 2020-04-14 DIAGNOSIS — Z8774 Personal history of (corrected) congenital malformations of heart and circulatory system: Secondary | ICD-10-CM

## 2020-04-14 NOTE — Telephone Encounter (Signed)
Patient is scheduled at [facility] for 04/27/20.

## 2020-04-27 ENCOUNTER — Ambulatory Visit (HOSPITAL_COMMUNITY)
Admission: RE | Admit: 2020-04-27 | Discharge: 2020-04-27 | Disposition: A | Discharge status: Home / Self Care | Payer: 59 | Source: Ambulatory Visit | Attending: Diagnostic Neuroimaging | Admitting: Diagnostic Neuroimaging

## 2020-04-27 ENCOUNTER — Other Ambulatory Visit: Payer: Self-pay

## 2020-04-27 DIAGNOSIS — H539 Unspecified visual disturbance: Secondary | ICD-10-CM | POA: Diagnosis not present

## 2020-04-27 MED ORDER — GADOBUTROL 1 MMOL/ML IV SOLN
10.0000 mL | Freq: Once | INTRAVENOUS | Status: AC | PRN
  Administered 2020-04-27: 10 mL via INTRAVENOUS

## 2020-04-27 NOTE — Progress Notes (Signed)
Pacemaker programed per order DOO 90.  Will reprogram to pre scan parameters after MRI

## 2020-04-29 ENCOUNTER — Telehealth: Payer: Self-pay | Admitting: *Deleted

## 2020-04-29 NOTE — Telephone Encounter (Signed)
Spoke with patient and informed him the MRI brain results unremarkable, continue current plan. Patient verbalized understanding, appreciation.

## 2020-05-02 ENCOUNTER — Ambulatory Visit: Payer: 59 | Admitting: Cardiology

## 2020-05-02 ENCOUNTER — Telehealth: Payer: Self-pay | Admitting: Cardiology

## 2020-05-02 MED ORDER — METOPROLOL SUCCINATE 25 MG PO CS24: 1.0000 | EXTENDED_RELEASE_CAPSULE | Freq: Every day | ORAL | 11 refills | Status: DC

## 2020-05-02 NOTE — Telephone Encounter (Signed)
*  STAT* If patient is at the pharmacy, call can be transferred to refill team.   1. Which medications need to be refilled? (please list name of each medication and dose if known) need a new prescription for Metoprolol tablets  2. Which pharmacy/location (including street and city if local pharmacy) is medication to be sent to? [facility] [place]   3. Do they need a 30 day or 90 day supply? 30days and refills

## 2020-05-03 MED ORDER — METOPROLOL SUCCINATE ER 50 MG PO TB24: 50.0000 mg | ORAL_TABLET | Freq: Every day | ORAL | 3 refills | Status: DC

## 2020-05-03 NOTE — Telephone Encounter (Signed)
[facility] calling to request metoprolol tablets not capsules, because the tablets are cheaper. They would like a call back to clarify if this is okay. Phone: [phone] option 1

## 2020-05-03 NOTE — Telephone Encounter (Signed)
Changed to tablets, called into pharmacy

## 2020-05-03 NOTE — Addendum Note (Signed)
Addended by: Alyson Ingles on: 05/03/2020 09:12 AM   Modules accepted: Orders

## 2020-05-07 NOTE — Progress Notes (Deleted)
Cardiology Office Note:    Date:  05/07/2020   ID:  [name], DOB 1985-05-16, MRN [id number]  PCP:  [name], MD  Cardiologist:  Olga Millers, MD  Electrophysiologist:  [name], MD   Referring MD: [name], MD   Chief Complaint: follow-up of dyspnea and lightheadedness/dizziness  History of Present Illness:    [name] is a 35 y.o. male with a history endocarditis of aortic valve in 12/2013 s/p aortic root replacement/AVR with homograft with close of PFO and TV repair; redo sternotomy in 02/2014 with repair of VSD, TV, and redo PFO closure; complete heart block post-op requiring pacemaker placement; non-ischemic cardiomyopathy with EF of 45-50%, mild sleep apnea, meningitis;and migraines who is followed by Dr. Jens Som and presents today for ***  Patient was first seen by Dr. Jens Som during an admission in 12/2013 for meningitis due to group B strep. 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 Patient became acutely unstable with severe AI and had to be intubated. CT surgery was consulted and patient underwent aortic root replacement with homograft, tricuspid valve repair, and closure of PFO by Dr. Tyrone Sage on 12/30/2013. 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 He did develop complete heart block following surgery and underwent PPM placement on 01/04/2014. Patient was readmitted in 01/2014 after developing acute shortness of breath. Repeat TEE revealed a large VSD with left to right shunting from the LVOT as well as disruption of the previously repaired fistula and aortic root abscess cavity. Therefore, he underwent redo sternotomy with repair of VSD,  tricuspid valve repair, and redo closure of PFO by Dr. Cornelius Moras on 02/12/2014. A new murmur was heard in 04/2014. Echo was ordered and showed an aortic vegetation. Therefore, he was admitted again. He remained afebrile throughout the entire admission. Blood cultures were negative. TEE revealed a new large mobile irregular bordered echodensity along the anterior aspect of the subvalvular apparatus of the bioprosthetic aortic valve in the LVOT consistent with vegetation. He was seen by Dr. Tyrone Sage who recommended against a 3rd time redo. ID was consulted and IV antibiotics were resumed.  He presented to the ED in 03/2019 with dizziness and dyspnea and was found to have RV lead failure. He eventually underwent lead extraction on 04/06/2019 and a new Medtronic RV lead was placed and a Medtronic dual-chamber pacemaker was used to replace the previous pacemaker. Due to persistent dizziness, his Metoprolol was discontinued. Patient was seen by Azalee Course, PA-C, in the office after a ED visit for rapid heartbeat and dizziness. Recent device interrogation at that time showed normal device function with 3 episodes of AT/AF (longest lasting 7 minutes). Per [name]'s note, the dizziness was lasting much longer than the duration of AT/AF so something else was felt to be the cause. Patient was concerned because these were similar symptoms to how he felt before he was diagnosed with endocarditis. It was also discovered that he had not been on antibiotics since his device change. Repeat labs showed normal CBC and blood culures were negative. Urgent Echo was ordered showed LVEF of 40-45% with normal wall motion, mild AI, mild to moderate AS but no obvious vegetations. He was restarted on Amoxiciillin and  advised to follow-up with Dr. Algis Liming for final input on duration of this. He was seen by Dr. Algis Liming virtually who was quite concerned about the dizzy episodes and referred patient to Neurology. Dizziness was not felt to be cardiac in  origin.  Patient was seen in the ED on 02/27/2020 for chest pain. EKG showed sinus rhythm with V-pacing. Chest CTA was negative for PE but did describe mosaic appearance of the lung parenchyma bilaterally which patient was concerned about. He was referred to Pulmonology. He was seen by Dr. Jens Som for follow-up on 02/29/2020 at which time he reported some increased dyspnea on exertion and at rest as well as some chest pressure if he takes a deep breath and some continued dizziness. Toprol-XL was restarted at 25mg  daily.Repeat Echo was ordered and showed LVEF of 45-50% with septal hypokinesis, grade 2 diastolic dysfunction, mild AI with mildly elevated mean gradient across AVR of 19 mmHg, trivial TR. RV normal in size with mildly reduced systolic function. No residual VSD noted..Stable from prior Echo.  Patient sent our office a MyChart message on 03/27/2020 with concerns of his heart rate dropping to the 50's and reports of feeling lightheaded and extremely exhausted. He was worried that his PPM was not working. He sent in a remote transmission and device was function normally.No episodes noted to explain symptoms. Patient called our office again on 03/29/2020 with continued concerns about heart rate variability as well as multiple other complaints including several weeks of feeling very fatigued, dizzy, short of breath, and intermittent chest pain. Therefore, this visit was scheduled to further discuss concerns. Of note,  Otilio Saber, PA-C, did personally review transmission report and agreed with normal function of device and no episodes. Heart rates were in the 90's most of the time.   I saw the patient on 04/01/2020 at which time had multiple concerns including continued lightheadedness/ dizziness, dyspnea, and atypical chest pain. BNP was normal. Not felt to be CHF. Sleep study was ordered due to reports of snoring and fatigue with daytime somnolence. He was following with Neurology for his  lightheadedness/dizziness with plans to undergo brain MRI. He also voiced concern for depression. I advised patient to discuss with PCP.   He was then seen by Edd Fabian, PA-C, to discuss frustration about his inability to increase his physical activity but was stable from a cardiac standpoint. He was advised to increase physical activity as tolerated and was given information on mindfulness stress reduction.  Patient presents today for follow-up. ***  Lightheadedness/Dizziness - ***   - Echo stable and no concerning arrhythmias on recent PPM device check. - Following with Neurology. Recent brain MRI showed no acute findings.  Palpitations  CHB s/p Medtronic PPM - *** - Continue Toprol-XL 50mg  daily.  Dyspnea Non-Ischemic Cardiomyopathy - Recent Echo on 03/16/2020 showed LVEF of 45-50% with septal hypokinesis, grade 2 diastolic dysfunction, mild AI with mildly elevated mean gradient across AVR of 19 mmHg, trivial TR. RV normal in size with mildly reduced systolic function. No residual VSD noted. Stable from prior Echo. - Chest CTA in 02/2020 negative for PE but showed mosaic pattern of lungs. He was referred to Pulmonology and underwent PFTs which were unremarkabel  Atypical Chest Pain - Patient reports very atypical chest pain. Does not sound cardiac in nature. - *** - No additional work-up necessary at this time.  History of Endocarditis s/p Aortic Root Replacement/AVR in 12/2013 - S/p aortic root replacement/AVR in 12/2013.  - Recent Echo showed stable AVR  with only mild AI. Mean gradient 19 mmHg.  Prior Tricuspid Valve Repair/ PFO Closure/ VSD Repair in 12/2013 and 02/2014 - Stable on most recent Echo. No residual VSD flow noted. Only trivial TR with mean gradient across TV mildly elevated at 5 mmHg.  Mild Sleep Apnea - Sleep study showed increased upper airway resistance without significant obstructive sleep apnea; however, mild sleep apnea was noted during REM sleep. Did  not meet criteria for CPAP.   Past Medical History:  Diagnosis Date  . AKI (acute kidney injury) (HCC)   . Aortic valve vegetation on echo 04/29/14 04/29/2014   managed medically  . Brain abscess   . Chest pain 03/02/2020  . Congestive dilated cardiomyopathy (HCC) 06/23/2015  . Drug-induced leukopenia (HCC) 06/15/2014  . Endocarditis of aortic valve-November 2015   . Fracture of left femur (HCC)   . History of open heart surgery    December 30, 2013- ascending aortic root replacement, TEE  . Lightheaded 03/02/2020  . Meningitis    Group B Strep (November 2015)  . Migraine headache 10/15/2019   since 2008  . Obesity   . Patent foramen ovale    Closed during procedure on December 30, 2013  . Presence of permanent cardiac pacemaker 01/04/2014   PPM MEDTRONIC FOR COMPLETE HEART BLOCK  . 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 S/P aortic root and valve allograft 12/30/2013   Human allograft aortic root replacement with repair of aorta-right atrial fistula and tricuspid valve repair  . S/P placement of cardiac pacemaker 01/04/2014   Medtronic (serial number [id number]) dual chamber permanent pacemaker implanted by Dr Ladona Ridgelaylor  . S/P redo patent foramen ovale closure 02/12/2014  . S/P redo tricuspid valve repair 02/12/2014   Complex valvuloplasty including patch closure of VSD with plication of septal leaflet and 26 mm Edwards mc3 ring annuloplasty  . S/P ventricular septal defect repair + redo tricuspid valve repair + redo closure PFO 02/12/2014   Redo sternotomy for bovine pericardial patch repair of large ventricular septal defect (recurrent LVOT to RA fistula due to bacterial endocarditis)  . Smokeless tobacco use   . Third degree heart block (HCC) 12/31/2013  . Thrombocytopenia (HCC) 12/2013  . Tricuspid regurgitation 02/11/2014  . Ventricular septal defect 02/12/2014   Post-infectious s/p repair of LVOT to RA fistula for bacterial endocarditis    Past Surgical History:  Procedure  Laterality Date  . ASCENDING AORTIC ROOT REPLACEMENT N/A 12/30/2013   Procedure: ASCENDING AORTIC ROOT REPLACEMENT with 23mm HOMOGRAFT, CLOSURE OF AORTIC TO RIGHT ATRIAL FISTULA, CLOSURE OF PATENT FORAMEN OVALE;  Surgeon: Delight OvensEdward B Gerhardt, MD;  Location: [facility] OR;  Service: Open Heart Surgery;  Laterality: N/A;  . INSERT / REPLACE / REMOVE PACEMAKER  01/04/2014  . INTRAOPERATIVE TRANSESOPHAGEAL ECHOCARDIOGRAM N/A 12/30/2013   Procedure: INTRAOPERATIVE TRANSESOPHAGEAL ECHOCARDIOGRAM;  Surgeon: Delight OvensEdward B Gerhardt, MD;  [facility];  Service: Open Heart Surgery;  Laterality: N/A;  . PACEMAKER INSERTION Left 04/06/2019   Procedure: PACEMAKER LEAD INSERTION;  Surgeon: Marinus Mawaylor, Gregg W, MD;  [facility];  Service: Cardiovascular;  Laterality: Left;  . PACEMAKER LEAD REMOVAL N/A 04/06/2019   Procedure: PACEMAKER LEAD REMOVAL;  Surgeon: Marinus Mawaylor, Gregg W, MD;  [facility];  Service: Cardiovascular;  Laterality: N/A;  . PERMANENT PACEMAKER INSERTION N/A 01/04/2014   Procedure: PERMANENT PACEMAKER INSERTION;  Surgeon: Marinus MawGregg W Taylor, MD;  [facility];  Service: Cardiovascular;  Laterality: N/A;  . RIGHT HEART CATHETERIZATION N/A 02/12/2014   Procedure: RIGHT HEART CATH;  Surgeon: Laurey Morale, MD;  Location: [facility] C[facility] LAB;  Service: Cardiovascular;  Laterality: N/A;  . TEE WITHOUT CARDIOVERSION N/A 02/12/2014   Procedure: TRANSESOPHAGEAL ECHOCARDIOGRAM (TEE);  Surgeon: Quintella Reichert, MD;  Location: [facility] ENDOSCOPY;  Service: Cardiovascular;  Laterality: N/A;  . TEE WITHOUT CARDIOVERSION N/A 04/30/2014   Procedure: TRANSESOPHAGEAL ECHOCARDIOGRAM (TEE);  Surgeon: Jake Bathe, MD;  Location[facility] ENDOSCOPY;  Service: Cardiovascular;  Laterality: N/A;  . TRICUSPID VALVE REPLACEMENT N/A 12/30/2013   Procedure: REPAIR OF TRICUSPID VALVE LEAFLET;  Surgeon: Delight Ovens, MD;  Location: [facility] OR;  Service: Open Heart Surgery;  Laterality: N/A;  . TRICUSPID VALVE REPLACEMENT N/A 02/12/2014   Procedure:  TRICUSPID VALVE REPAIR, REDO MEDIAN STERNOTOMY, REPAIR OF VENTRICULAR SEPTAL DEFECT (RECURRENT LV OUTFLOW TRACT TO RIGHT ATRIAL FISTULA), REDO CLOSURE OF PATENT FORAMEN OVALE;  Surgeon: Purcell Nails, MD;  Location: [facility] OR;  Service: Open Heart Surgery;  Laterality: N/A;  . VENOGRAM Left 04/06/2019   Procedure: Venogram;  Surgeon: Marinus Maw, MD;  Location: [facility] OR;  Service: Cardiovascular;  Laterality: Left;    Current Medications: No outpatient medications have been marked as taking for the 05/13/20 encounter (Appointment) with Corrin Parker, PA-C.     Allergies:   Cefepime and Vancomycin   Social History   Socioeconomic History  . Marital status: Married    Spouse name: Sirena  . Number of children: 2  . Years of education: 31  . Highest education level: Some college, no degree  Occupational History  . Not on file  Tobacco Use  . Smoking status: Never Smoker  . Smokeless tobacco: Former Neurosurgeon    Types: Snuff  Substance and Sexual Activity  . Alcohol use: Yes    Alcohol/week: 0.0 standard drinks    Comment: socially  . Drug use: No  . Sexual activity: Yes    Birth control/protection: Condom  Other Topics Concern  . Not on file  Social History Narrative   Lives with wife, children   Caffeine- coffee, 1 c, 2-3 cans soda a day   Social Determinants of Health   Financial Resource Strain: Not on file  Food Insecurity: Not on file  Transportation Needs: Not on file  Physical Activity: Not on file  Stress: Not on file  Social Connections: Not on file     Family History: The patient's ***family history includes COPD in his father; Heart murmur in his brother; Migraines in his brother, brother, maternal grandmother, and mother. There is no history of Asthma.  ROS:   Please see the history of present illness.    *** All other systems reviewed and are negative.  EKGs/Labs/Other Studies Reviewed:    The following studies were reviewed today:  Echocardiogram  03/16/2020: Impressions: 1. Left ventricular ejection fraction, by estimation, is 45 to 50%. The  left ventricle has mildly decreased function. The left ventricle  demonstrates regional wall motion abnormalities with septal hypokinesis.  Left ventricular diastolic parameters are  consistent with Grade II diastolic dysfunction (pseudonormalization). No  residual VSD flow noted (s/p VSD repair).  2. Right ventricular systolic function is mildly reduced. The right  ventricular size is normal. There is normal pulmonary artery systolic  pressure. The estimated right ventricular systolic pressure is 24.7 mmHg.  3. The mitral valve is normal in structure. Trivial mitral valve  regurgitation. No evidence of mitral stenosis.  4. Human allograft aortic root and valve replacement. Mean gradient 19  mmHg, mildly elevated. Mild aortic insufficiency.  5.  Status post tricuspid valve repair. Trivial tricuspid regurgitation.  Mean gradient 5 mmHg, mildly elevated.  6. The inferior vena cava is normal in size with greater than 50%  respiratory variability, suggesting right atrial pressure of 3 mmHg.   EKG:  EKG not ordered today.  Recent Labs: 02/27/2020: BUN 22; Creatinine, Ser 1.14; Hemoglobin 16.9; Platelets 211; Potassium 3.7; Sodium 139 04/01/2020: NT-Pro BNP 65  Recent Lipid Panel    Component Value Date/Time   TRIG 119 12/29/2013 2200    Physical Exam:    Vital Signs: There were no vitals taken for this visit.    Wt Readings from Last 3 Encounters:  04/12/20 238 lb (108 kg)  04/11/20 236 lb (107 kg)  04/11/20 236 lb 3.2 oz (107.1 kg)     General: 35 y.o. male in no acute distress. HEENT: Normocephalic and atraumatic. Sclera clear. EOMs intact. Neck: Supple. No carotid bruits. No JVD. Heart: *** RRR. Distinct S1 and S2. No murmurs, gallops, or rubs. Radial and distal pedal pulses 2+ and equal bilaterally. Lungs: No increased work of breathing. Clear to ausculation bilaterally. No  wheezes, rhonchi, or rales.  Abdomen: Soft, non-distended, and non-tender to palpation. Bowel sounds present in all 4 quadrants.  MSK: Normal strength and tone for age. *** Extremities: No lower extremity edema.    Skin: Warm and dry. Neuro: Alert and oriented x3. No focal deficits. Psych: Normal affect. Responds appropriately.   Assessment:    No diagnosis found.  Plan:     Disposition: Follow up in ***   Medication Adjustments/Labs and Tests Ordered: Current medicines are reviewed at length with the patient today.  Concerns regarding medicines are outlined above.  No orders of the defined types were placed in this encounter.  No orders of the defined types were placed in this encounter.   There are no Patient Instructions on file for this visit.   Signed, Corrin Parker, PA-C  05/07/2020 10:24 AM    [facility]

## 2020-05-07 NOTE — Procedures (Signed)
[facility]        Patient Name: [name], [name] Date: 04/14/2020 Gender: Male D.O.B: 03-06-1985 Age (years): 34 Referring Provider: Marjie Skiff PA-C Height (inches): 65 Interpreting Physician: Albert Guadalajara MD, ABSM Weight (lbs): 238 RPSGT: Peak, [name] BMI: 40 MRN: [id number] Neck Size: 16.00  CLINICAL INFORMATION Sleep Study Type: NPSG  Indication for sleep study: snoring, fatigue  Epworth Sleepiness Score: 4  SLEEP STUDY TECHNIQUE As per the AASM Manual for the Scoring of Sleep and Associated Events v2.3 (April 2016) with a hypopnea requiring 4% desaturations.  The channels recorded and monitored were frontal, central and occipital EEG, electrooculogram (EOG), submentalis EMG (chin), nasal and oral airflow, thoracic and abdominal wall motion, anterior tibialis EMG, snore microphone, electrocardiogram, and pulse oximetry.  MEDICATIONS acetaminophen (TYLENOL) 500 MG tablet albuterol (VENTOLIN HFA) 108 (90 Base) MCG/ACT inhaler ALPRAZolam (XANAX) 0.5 MG tablet amoxicillin (AMOXIL) 500 MG tablet aspirin EC 81 MG tablet aspirin-acetaminophen-caffeine (EXCEDRIN MIGRAINE) 250-250-65 MG tablet bismuth subsalicylate (PEPTO BISMOL) 262 MG/15ML suspension metoprolol succinate (TOPROL-XL) 50 MG 24 hr tablet Medications self-administered by patient taken the night of the study : N/A  SLEEP ARCHITECTURE The study was initiated at 10:02:55 PM and ended at 5:17:45 AM.  Sleep onset time was 20.4 minutes and the sleep efficiency was 93.8%. The total sleep time was 407.9 minutes.  Stage REM latency was 151.0 minutes.  The patient spent 1.10% of the night in stage N1 sleep, 66.42% in stage N2 sleep, 10.17% in stage N3 and 22.3% in REM.  Alpha intrusion was absent.  Supine sleep was 70.74%.  RESPIRATORY PARAMETERS The overall apnea/hypopnea index (AHI) was 3.4 per hour. The respiratory disturbance index (RDI) was 3.4 /h. There were 7 total apneas,  including 0 obstructive, 7 central and 0 mixed apneas. There were 16 hypopneas and 0 RERAs.  The AHI during Stage REM sleep was 8.6 per hour.  AHI while supine was 3.5 per hour.  The mean oxygen saturation was 93.79%. The minimum SpO2 during sleep was 88.00%.  Soft snoring was noted during this study.  CARDIAC DATA The 2 lead EKG demonstrated pacemaker generated. The mean heart rate was 71.10 beats per minute. Other EKG findings include: None.  LEG MOVEMENT DATA The total PLMS were 0 with a resulting PLMS index of 0.00. Associated arousal with leg movement index was 0.0 .  IMPRESSIONS - Increased upper airway resistance (UARS) without significant obstructive sleep apnea overall (AHI 3.4/h; RDI 3.4/h); however, mild sleep apnea was present during REM sleep (AHI 8.6/h). - No significant central sleep apnea occurred during this study (CAI = 1/h). - The patient had minimal oxygen desaturation 8to a nadir of 88%. - The patient snored with soft snoring volume. - No cardiac abnormalities were noted during this study. - Clinically significant periodic limb movements did not occur during sleep. No significant associated arousals.  DIAGNOSIS - UARS - sleep apnea, unspecified G47.30  RECOMMENDATIONS - At present patient does not meet criteria for CPAP.  - Effort should be made to optimize nasal and oropharyngeal patency.  - Avoid alcohol, sedatives and other CNS depressants that may worsen sleep apnea and disrupt normal sleep architecture. - Sleep hygiene should be reviewed to assess factors that may improve sleep quality. - Weight management (BMI 40) and regular exercise should be initiated or continued if appropriate.  [Electronically signed] 05/07/2020 12:48 PM  Albert Guadalajara MD, [facility], ABSM Diplomate, American Board  of Sleep Medicine   NPI: [id number] [facility] PH: [phone]   FX: [phone] ACCREDITED BY THE AMERICAN ACADEMY OF SLEEP MEDICINE

## 2020-05-13 ENCOUNTER — Ambulatory Visit: Payer: 59 | Admitting: Student

## 2020-05-17 NOTE — Progress Notes (Signed)
Dicussed with Dr. Celine Mans, no imaging recommended. Continue with ENT referral.

## 2020-05-18 ENCOUNTER — Telehealth: Payer: Self-pay | Admitting: *Deleted

## 2020-05-18 NOTE — Telephone Encounter (Signed)
Patient notified of sleep study results. 

## 2020-06-06 ENCOUNTER — Emergency Department (HOSPITAL_COMMUNITY): Payer: 59

## 2020-06-06 ENCOUNTER — Other Ambulatory Visit: Payer: Self-pay

## 2020-06-06 ENCOUNTER — Encounter (HOSPITAL_COMMUNITY): Payer: Self-pay

## 2020-06-06 ENCOUNTER — Emergency Department (HOSPITAL_COMMUNITY)
Admission: EM | Admit: 2020-06-06 | Discharge: 2020-06-06 | Disposition: A | Discharge status: Home / Self Care | Payer: 59 | Attending: Emergency Medicine | Admitting: Emergency Medicine

## 2020-06-06 DIAGNOSIS — Z950 Presence of cardiac pacemaker: Secondary | ICD-10-CM | POA: Insufficient documentation

## 2020-06-06 DIAGNOSIS — Z7982 Long term (current) use of aspirin: Secondary | ICD-10-CM | POA: Diagnosis not present

## 2020-06-06 DIAGNOSIS — R0789 Other chest pain: Secondary | ICD-10-CM | POA: Diagnosis present

## 2020-06-06 DIAGNOSIS — Z87891 Personal history of nicotine dependence: Secondary | ICD-10-CM | POA: Diagnosis not present

## 2020-06-06 LAB — BASIC METABOLIC PANEL
Anion gap: 9 (ref 5–15)
BUN: 16 mg/dL (ref 6–20)
CO2: 25 mmol/L (ref 22–32)
Calcium: 9.6 mg/dL (ref 8.9–10.3)
Chloride: 104 mmol/L (ref 98–111)
Creatinine, Ser: 1.12 mg/dL (ref 0.61–1.24)
GFR, Estimated: >60 mL/min (ref >60)
Glucose, Bld: 96 mg/dL (ref 70–99)
Potassium: 4.3 mmol/L (ref 3.5–5.1)
Sodium: 138 mmol/L (ref 135–145)

## 2020-06-06 LAB — CBC
HCT: 47.3 % (ref 39.0–52.0)
Hemoglobin: 16 g/dL (ref 13.0–17.0)
MCH: 30.3 pg (ref 26.0–34.0)
MCHC: 33.8 g/dL (ref 30.0–36.0)
MCV: 89.6 fL (ref 80.0–100.0)
Platelets: 232 10*3/uL (ref 150–400)
RBC: 5.28 MIL/uL (ref 4.22–5.81)
RDW: 12.8 % (ref 11.5–15.5)
WBC: 5.6 10*3/uL (ref 4.0–10.5)
nRBC: 0 % (ref 0.0–0.2)

## 2020-06-06 LAB — TROPONIN I (HIGH SENSITIVITY): Troponin I (High Sensitivity): 3 ng/L (ref <18)

## 2020-06-06 MED ORDER — IOHEXOL 350 MG/ML SOLN
100.0000 mL | Freq: Once | INTRAVENOUS | Status: AC | PRN
  Administered 2020-06-06: 60 mL via INTRAVENOUS

## 2020-06-06 NOTE — ED Provider Notes (Signed)
[facility] Provider Note   CSN: [id number] Arrival date & time: 06/06/20  1037     History Chief Complaint  Patient presents with  . Chest Pain    Elliot CousinHayden Sweitzer is a 35 y.o. male.  35 year old male with prior medical history as detailed below presents for evaluation.  Patient reports approximately 1 week of a left-sided chest discomfort.  Patient describes palm-sized area of discomfort to the left upper chest wall.  Patient's pain is worse with deep breathing or twisting of his thorax.  He denies associated diaphoresis or fever.  He denies shortness of breath.  He denies prior history of PE.  Patient reports that he is admittedly anxious about his symptoms today.  The history is provided by the patient and medical records.  Chest Pain Pain location:  L chest Pain quality: sharp and stabbing   Pain radiates to:  Does not radiate Pain severity:  Mild Onset quality:  Gradual Duration:  1 week Timing:  Constant Progression:  Waxing and waning Chronicity:  New Context: breathing and movement   Relieved by:  Nothing Worsened by:  Nothing      Past Medical History:  Diagnosis Date  . AKI (acute kidney injury) (HCC)   . Aortic valve vegetation on echo 04/29/14 04/29/2014   managed medically  . Brain abscess   . Chest pain 03/02/2020  . Congestive dilated cardiomyopathy (HCC) 06/23/2015  . Drug-induced leukopenia (HCC) 06/15/2014  . Endocarditis of aortic valve-November 2015   . Fracture of left femur (HCC)   . History of open heart surgery    December 30, 2013- ascending aortic root replacement, TEE  . Lightheaded 03/02/2020  . Meningitis    Group B Strep (November 2015)  . Migraine headache 10/15/2019   since 2008  . Obesity   . Patent foramen ovale    Closed during procedure on December 30, 2013  . Presence of permanent cardiac pacemaker 01/04/2014   PPM MEDTRONIC FOR COMPLETE HEART BLOCK  . 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 S/P aortic root and valve allograft 12/30/2013   Human allograft aortic root replacement with repair of aorta-right atrial fistula and tricuspid valve repair  . S/P placement of cardiac pacemaker 01/04/2014   Medtronic (serial number [id number]) dual chamber permanent pacemaker implanted by Dr Ladona Ridgelaylor  . S/P redo patent foramen ovale closure 02/12/2014  . S/P redo tricuspid valve repair 02/12/2014   Complex valvuloplasty including patch closure of VSD with plication of septal leaflet and 26 mm Edwards mc3 ring annuloplasty  . S/P ventricular septal defect repair + redo tricuspid valve repair + redo closure PFO 02/12/2014   Redo sternotomy for bovine pericardial patch repair of large ventricular septal defect (recurrent LVOT to RA fistula due to bacterial endocarditis)  . Smokeless tobacco use   . Third degree heart block (HCC) 12/31/2013  . Thrombocytopenia (HCC) 12/2013  . Tricuspid regurgitation 02/11/2014  . Ventricular septal defect 02/12/2014   Post-infectious s/p repair of LVOT to RA fistula for bacterial endocarditis    Patient Active Problem List   Diagnosis Date Noted  . Chest pain 03/02/2020  . Lightheaded 03/02/2020  . Migraine headache 10/15/2019  . Symptomatic bradycardia 04/05/2019  . Congestive dilated cardiomyopathy (HCC) 06/23/2015  . Pedal edema 05/12/2015  . 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 Drug-induced leukopenia (HCC) 06/15/2014  . Endocarditis   . Aortic valve vegetation on echo 04/29/14 04/29/2014  . Lower back pain 03/10/2014  . residual aortic  insufficiency s/p allograft aortic root replacement 03/08/2014  . Group B streptococcal infection   . S/P ventricular septal defect repair + redo tricuspid valve repair + redo closure PFO 02/12/2014  . S/P redo tricuspid valve repair 02/12/2014  . S/P redo patent foramen ovale closure 02/12/2014  . Dyspnea   . Pacemaker- MDT Nov 2015 02/11/2014  . Dyspnea on exertion 02/11/2014  . S/P placement  of cardiac pacemaker 01/04/2014  . Third degree heart block (HCC) 12/31/2013  . Endocarditis of aortic valve-November 2015   . S/P aortic root and valve allograft Nov 2015with re do surgery Jan 1st 2016 12/30/2013  . Bacterial endocarditis   . Bilateral edema of lower extremity   . Sepsis due to group B Streptococcus (HCC)   . LFT elevation   . Headache   . Pain in joint, lower leg   . Dehydration     Past Surgical History:  Procedure Laterality Date  . ASCENDING AORTIC ROOT REPLACEMENT N/A 12/30/2013   Procedure: ASCENDING AORTIC ROOT REPLACEMENT with 51mm HOMOGRAFT, CLOSURE OF AORTIC TO RIGHT ATRIAL FISTULA, CLOSURE OF PATENT FORAMEN OVALE;  Surgeon: Delight Ovens, MD;  Location: [facility] OR;  Service: Open Heart Surgery;  Laterality: N/A;  . INSERT / REPLACE / REMOVE PACEMAKER  01/04/2014  . INTRAOPERATIVE TRANSESOPHAGEAL ECHOCARDIOGRAM N/A 12/30/2013   Procedure: INTRAOPERATIVE TRANSESOPHAGEAL ECHOCARDIOGRAM;  Surgeon: Delight Ovens, MD;  Location: [facility] OR;  Service: Open Heart Surgery;  Laterality: N/A;  . PACEMAKER INSERTION Left 04/06/2019   Procedure: PACEMAKER LEAD INSERTION;  Surgeon: Marinus Maw, MD;  Location: [facility] OR;  Service: Cardiovascular;  Laterality: Left;  . PACEMAKER LEAD REMOVAL N/A 04/06/2019   Procedure: PACEMAKER LEAD REMOVAL;  Surgeon: Marinus Maw, MD;  Location: [facility] OR;  Service: Cardiovascular;  Laterality: N/A;  . PERMANENT PACEMAKER INSERTION N/A 01/04/2014   Procedure: PERMANENT PACEMAKER INSERTION;  Surgeon: Marinus Maw, MD;  Location: [facility] CATH LAB;  Service: Cardiovascular;  Laterality: N/A;  . RIGHT HEART CATHETERIZATION N/A 02/12/2014   Procedure: RIGHT HEART CATH;  Surgeon: Laurey Morale, MD;  Location: [facility] CATH LAB;  Service: Cardiovascular;  Laterality: N/A;  . TEE WITHOUT CARDIOVERSION N/A 02/12/2014   Procedure: TRANSESOPHAGEAL ECHOCARDIOGRAM (TEE);  Surgeon: Quintella Reichert, MD;  Location: [facility] ENDOSCOPY;  Service: Cardiovascular;  Laterality: N/A;   . TEE WITHOUT CARDIOVERSION N/A 04/30/2014   Procedure: TRANSESOPHAGEAL ECHOCARDIOGRAM (TEE);  Surgeon: Jake Bathe, MD;  Location: [facility] ENDOSCOPY;  Service: Cardiovascular;  Laterality: N/A;  . TRICUSPID VALVE REPLACEMENT N/A 12/30/2013   Procedure: REPAIR OF TRICUSPID VALVE LEAFLET;  Surgeon: Delight Ovens, MD;  Location: [facility] OR;  Service: Open Heart Surgery;  Laterality: N/A;  . TRICUSPID VALVE REPLACEMENT N/A 02/12/2014   Procedure: TRICUSPID VALVE REPAIR, REDO MEDIAN STERNOTOMY, REPAIR OF VENTRICULAR SEPTAL DEFECT (RECURRENT LV OUTFLOW TRACT TO RIGHT ATRIAL FISTULA), REDO CLOSURE OF PATENT FORAMEN OVALE;  Surgeon: Purcell Nails, MD;  Location: [facility] OR;  Service: Open Heart Surgery;  Laterality: N/A;  . VENOGRAM Left 04/06/2019   Procedure: Venogram;  Surgeon: Marinus Maw, MD;  Location: [facility] OR;  Service: Cardiovascular;  Laterality: Left;       Family History  Problem Relation Age of Onset  . Migraines Mother   . COPD Father   . Migraines Brother   . Heart murmur Brother   . Migraines Brother   . Migraines Maternal Grandmother   . Asthma Neg Hx     Social History   Tobacco Use  .  Smoking status: Never Smoker  . Smokeless tobacco: Former Neurosurgeon    Types: Snuff  Substance Use Topics  . Alcohol use: Yes    Alcohol/week: 0.0 standard drinks    Comment: socially  . Drug use: No    Home Medications Prior to Admission medications   Medication Sig Start Date End Date Taking? Authorizing Provider  acetaminophen (TYLENOL) 500 MG tablet Take 1,000 mg by mouth every 6 (six) hours as needed for moderate pain or headache.    [provider]  albuterol (VENTOLIN HFA) 108 (90 Base) MCG/ACT inhaler Inhale 2 puffs into the lungs every 6 (six) hours as needed for wheezing or shortness of breath. 03/03/20   Charlott Holler, MD  ALPRAZolam Prudy Feeler) 0.5 MG tablet for sedation before MRI scan; take 1 tab 1 hour before scan; may repeat 1 tab 15 min before scan 04/12/20   Penumalli,  Glenford Bayley, MD  amoxicillin (AMOXIL) 500 MG tablet Take all 4 tablets 1 hour prior to dental work Patient not taking: Reported on 04/12/2020 03/29/20   [name], MD  aspirin EC 81 MG tablet Take 81 mg by mouth daily.     [provider]  aspirin-acetaminophen-caffeine (EXCEDRIN MIGRAINE) [phone] MG tablet Take by mouth every 6 (six) hours as needed for headache.    [provider]  bismuth subsalicylate (PEPTO BISMOL) 262 MG/15ML suspension Take 30 mLs by mouth every 6 (six) hours as needed for indigestion or diarrhea or loose stools.    [provider]  metoprolol succinate (TOPROL-XL) 50 MG 24 hr tablet Take 1 tablet (50 mg total) by mouth daily. Take with or immediately following a meal. 05/03/20 08/01/20  [name], MD    Allergies    Cefepime and Vancomycin  Review of Systems   Review of Systems  Cardiovascular: Positive for chest pain.  All other systems reviewed and are negative.   Physical Exam Updated Vital Signs BP 104/67   Pulse 64   Temp 97.9 F (36.6 C) (Oral)   Resp 15   Ht 5\' 5"  (1.651 m)   Wt 106 kg   SpO2 96%   BMI 38.89 kg/m   Physical Exam Vitals and nursing note reviewed.  Constitutional:      General: He is not in acute distress.    Appearance: He is well-developed.  HENT:     Head: Normocephalic and atraumatic.  Eyes:     Conjunctiva/sclera: Conjunctivae normal.     Pupils: Pupils are equal, round, and reactive to light.  Cardiovascular:     Rate and Rhythm: Normal rate and regular rhythm.     Heart sounds: Normal heart sounds.  Pulmonary:     Effort: Pulmonary effort is normal. No respiratory distress.     Breath sounds: Normal breath sounds.  Abdominal:     General: There is no distension.     Palpations: Abdomen is soft.     Tenderness: There is no abdominal tenderness.  Musculoskeletal:        General: No deformity. Normal range of motion.     Cervical back: Normal range of motion and neck supple.   Skin:    General: Skin is warm and dry.  Neurological:     General: No focal deficit present.     Mental Status: He is alert. He is disoriented.     ED Results / Procedures / Treatments   Labs (all labs ordered are listed, but only abnormal results are displayed) Labs Reviewed  BASIC METABOLIC PANEL  CBC  TROPONIN I (HIGH SENSITIVITY)    EKG EKG Interpretation  Date/Time:  Monday June 06 2020 10:47:34 EDT Ventricular Rate:  71 PR Interval:  162 QRS Duration: 178 QT Interval:  436 QTC Calculation: 473 R Axis:   -77 Text Interpretation: Atrial-sensed ventricular-paced rhythm Abnormal ECG Confirmed by Kristine Royal [phone]) on 06/06/2020 11:14:08 AM   Radiology CT Angio Chest PE W and/or Wo Contrast  Result Date: 06/06/2020 CLINICAL DATA:  Left-sided chest pain. EXAM: CT ANGIOGRAPHY CHEST WITH CONTRAST TECHNIQUE: Multidetector CT imaging of the chest was performed using the standard protocol during bolus administration of intravenous contrast. Multiplanar CT image reconstructions and MIPs were obtained to evaluate the vascular anatomy. CONTRAST:  42mL OMNIPAQUE IOHEXOL 350 MG/ML SOLN COMPARISON:  CTA chest dated February 28, 2020. FINDINGS: Cardiovascular: Satisfactory opacification of the pulmonary arteries to the segmental level. No evidence of pulmonary embolism. Unchanged mild cardiomegaly status post CABG and tricuspid valve replacement. Prior ascending thoracic aortic graft repair. No thoracic aortic aneurysm or dissection. No pericardial effusion. Left chest wall pacemaker again noted. Mediastinum/Nodes: No enlarged mediastinal, hilar, or axillary lymph nodes. Thyroid gland, trachea, and esophagus demonstrate no significant findings. Lungs/Pleura: Lungs are clear. No pleural effusion or pneumothorax. Upper Abdomen: No acute abnormality.  Unchanged small gallstones. Musculoskeletal: No chest wall abnormality. No acute or significant osseous findings. Review of the MIP images  confirms the above findings. IMPRESSION: 1. No evidence of pulmonary embolism. No acute intrathoracic process. 2. Unchanged cholelithiasis. Electronically Signed   By: Obie Dredge M.D.   On: 06/06/2020 14:43   DG Chest Port 1 View  Result Date: 06/06/2020 CLINICAL DATA:  Chest pain EXAM: PORTABLE CHEST 1 VIEW COMPARISON:  February 27, 2020 chest radiograph and chest CT February 28, 2020 FINDINGS: Lungs are clear. Heart is slightly enlarged with pulmonary vascularity normal. Pacemaker leads attached to right atrium and right ventricle. Patient is status post median sternotomy. No pneumothorax. No bone lesions. IMPRESSION: Lungs clear. Heart mildly enlarged with pacemaker leads attached to right atrium and right ventricle. Electronically Signed   By: Bretta Bang III M.D.   On: 06/06/2020 12:12    Procedures Procedures   Medications Ordered in ED Medications - No data to display  ED Course  I have reviewed the triage vital signs and the nursing notes.  Pertinent labs & imaging results that were available during my care of the patient were reviewed by me and considered in my medical decision making (see chart for details).    MDM Rules/Calculators/A&P                          MDM  MSE complete  Famous Eisenhardt was evaluated in Emergency Department on 06/06/2020 for the symptoms described in the history of present illness. He was evaluated in the context of the global COVID-19 pandemic, which necessitated consideration that the patient might be at risk for infection with the SARS-CoV-2 virus that causes COVID-19. Institutional protocols and algorithms that pertain to the evaluation of patients at risk for COVID-19 are in a state of rapid change based on information released by regulatory bodies including the CDC and federal and state organizations. These policies and algorithms were followed during the patient's care in the ED.   Patient presented with complaint of atypical left-sided  chest discomfort.  Patient's presentation is not consistent with likely ACS.  However, given patient's extensive medical history work-up was obtained.  Screening labs without  significant abnormality.  Troponin was 3.  EKG was without acute ischemia.  Pacemaker interrogated and no significant events reported.  CT obtained was without evidence of PE or other significant pathology.  Patient reassured after his work-up.  He now desires discharge home.  Importance of close follow-up is stressed.  Strict return precautions given and understood.  Patient has established cardiology care and we will plan on following up with him later this week.         Final Clinical Impression(s) / ED Diagnoses Final diagnoses:  Atypical chest pain    Rx / DC Orders ED Discharge Orders    None       Wynetta Fines, MD 06/06/20 1512

## 2020-06-06 NOTE — ED Triage Notes (Signed)
Emergency Medicine Provider Triage Evaluation Note  [name] , a 35 y.o. male  was evaluated in triage.  Pt complains of CP.  Intermittent L side chest pain over 1 week.  Pain radiates to back, occasionally to L arm, worse with movement or deep breath.  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  Have been working out  Review of Systems  Positive: CP, L arm pain Negative: Fever, cough, exertional cp  Physical Exam  BP 117/83 (BP Location: Right Arm)   Pulse 71   Temp 97.9 F (36.6 C) (Oral)   Resp 16   SpO2 100%  Gen:   Awake, no distress   HEENT:  Atraumatic  Resp:  Normal effort  Cardiac:  Normal rate  Abd:   Nondistended, nontender  MSK:   Moves extremities without difficulty  Neuro:  Speech clear   Medical Decision Making  Medically screening exam initiated at 10:58 AM.  Appropriate orders placed.  Melford Tullier was informed that the remainder of the evaluation will be completed by another provider, this initial triage assessment does not replace that evaluation, and the importance of remaining in the ED until their evaluation is complete.  Clinical Impression  Chest pain   Fayrene Helper, PA-C 06/06/20 1106

## 2020-06-06 NOTE — ED Notes (Signed)
Looking for Medtronic interrogator at this time. ED staff is currently unable to find it. Dr Rodena Medin notified.

## 2020-06-06 NOTE — ED Notes (Signed)
Patient transported to CT 

## 2020-06-06 NOTE — ED Notes (Signed)
Patient's pacemaker interrogated at this time.

## 2020-06-06 NOTE — ED Notes (Signed)
ED Provider at bedside. 

## 2020-06-06 NOTE — Discharge Instructions (Signed)
Please return for any problem.  °

## 2020-06-06 NOTE — ED Notes (Signed)
Vital signs stable. 

## 2020-06-06 NOTE — ED Triage Notes (Signed)
Pt reports he is here today due to chest pain. Pt has h/o pacemaker. Pt reports he had full cardiac workup with his cardiologist recently and everything came back fine but states he just wanted to get check again.

## 2020-06-20 NOTE — Progress Notes (Signed)
HPI: FU AVR; initially seen in Nov 2015 with "flu" that ended up being bacterial endocarditis. He underwent aortic root and valve replacement with fistula repair. At that time he also had CHB and had a MDT PTVDP placed. Jan 2016 he had symptoms of dyspnea and was reevaluated. A TEE was done and and revealed a large VSD with left to right shunting from LVOT. On 02/12/2014 he underwent Redo Median Sternotomy, Repair of a VSD, Tricuspid Valve Repair, and Redo closure of PFO.Patient had a repeat echocardiogram on 04/29/2014 that showed mild global reduction in LV function. There was a mobile mass on the aortic valve concerning for vegetation as well as mild to moderate aortic insufficiency. The patient was admitted and IV antibiotics resumed. Blood cultures remained negative. Transesophageal echocardiogram revealed low normal LV function and an aortic valve mass. 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 Patient was evaluated by Dr. Tyrone Sage and medical therapy recommended.Echocardiogram February 2022 showed ejection fraction 45 to 50%, grade 2 diastolic dysfunction, previous VSD repair with no residual flow, mild RV dysfunction, status post aortic valve replacement with mean gradient 19 mmHg, mild aortic insufficiencies, previous tricuspid valve repair with trace tricuspid regurgitation and mean gradient 5 mmHg.  CTA April 2022 showed no pulmonary embolus.  Since last seen,  he has dyspnea with more vigorous activities but not routine activities.  No orthopnea, PND, pedal edema.  Occasional sharp pain in his lateral chest but no exertional chest pain.  No syncope.  He feels as though he has developed anxiety related to his heart.  Current Outpatient Medications  Medication Sig Dispense Refill  . acetaminophen (TYLENOL) 500 MG tablet Take 1,000 mg by mouth every 6 (six) hours as needed for moderate pain or headache.    Marland Kitchen amoxicillin (AMOXIL) 500 MG tablet Take all 4  tablets 1 hour prior to dental work 4 tablet 6  . aspirin EC 81 MG tablet Take 81 mg by mouth daily.     Marland Kitchen aspirin-acetaminophen-caffeine (EXCEDRIN MIGRAINE) 250-250-65 MG tablet Take by mouth every 6 (six) hours as needed for headache.    . bismuth subsalicylate (PEPTO BISMOL) 262 MG/15ML suspension Take 30 mLs by mouth every 6 (six) hours as needed for indigestion or diarrhea or loose stools.    . metoprolol succinate (TOPROL-XL) 50 MG 24 hr tablet Take 1 tablet (50 mg total) by mouth daily. Take with or immediately following a meal. 90 tablet 3   No current facility-administered medications for this visit.     Past Medical History:  Diagnosis Date  . AKI (acute kidney injury) (HCC)   . Aortic valve vegetation on echo 04/29/14 04/29/2014   managed medically  . Brain abscess   . Chest pain 03/02/2020  . Congestive dilated cardiomyopathy (HCC) 06/23/2015  . Drug-induced leukopenia (HCC) 06/15/2014  . Endocarditis of aortic valve-November 2015   . Fracture of left femur (HCC)   . History of open heart surgery    December 30, 2013- ascending aortic root replacement, TEE  . Lightheaded 03/02/2020  . Meningitis    Group B Strep (November 2015)  . Migraine headache 10/15/2019   since 2008  . Obesity   . Patent foramen ovale    Closed during procedure on December 30, 2013  . Presence of permanent cardiac pacemaker 01/04/2014   PPM MEDTRONIC FOR COMPLETE HEART BLOCK  . 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 S/P aortic root and valve allograft 12/30/2013   Human allograft  aortic root replacement with repair of aorta-right atrial fistula and tricuspid valve repair  . S/P placement of cardiac pacemaker 01/04/2014   Medtronic (serial number [id number]) dual chamber permanent pacemaker implanted by Dr Ladona Ridgel  . S/P redo patent foramen ovale closure 02/12/2014  . S/P redo tricuspid valve repair 02/12/2014   Complex valvuloplasty including patch closure of VSD with plication of septal leaflet  and 26 mm Edwards mc3 ring annuloplasty  . S/P ventricular septal defect repair + redo tricuspid valve repair + redo closure PFO 02/12/2014   Redo sternotomy for bovine pericardial patch repair of large ventricular septal defect (recurrent LVOT to RA fistula due to bacterial endocarditis)  . Smokeless tobacco use   . Third degree heart block (HCC) 12/31/2013  . Thrombocytopenia (HCC) 12/2013  . Tricuspid regurgitation 02/11/2014  . Ventricular septal defect 02/12/2014   Post-infectious s/p repair of LVOT to RA fistula for bacterial endocarditis    Past Surgical History:  Procedure Laterality Date  . ASCENDING AORTIC ROOT REPLACEMENT N/A 12/30/2013   Procedure: ASCENDING AORTIC ROOT REPLACEMENT with 31mm HOMOGRAFT, CLOSURE OF AORTIC TO RIGHT ATRIAL FISTULA, CLOSURE OF PATENT FORAMEN OVALE;  Surgeon: Delight Ovens, MD;  Location: [facility] OR;  Service: Open Heart Surgery;  Laterality: N/A;  . INSERT / REPLACE / REMOVE PACEMAKER  01/04/2014  . INTRAOPERATIVE TRANSESOPHAGEAL ECHOCARDIOGRAM N/A 12/30/2013   Procedure: INTRAOPERATIVE TRANSESOPHAGEAL ECHOCARDIOGRAM;  Surgeon: Delight Ovens, MD;  Location: [facility] OR;  Service: Open Heart Surgery;  Laterality: N/A;  . PACEMAKER INSERTION Left 04/06/2019   Procedure: PACEMAKER LEAD INSERTION;  Surgeon: Marinus Maw, MD;  Location: [facility] OR;  Service: Cardiovascular;  Laterality: Left;  . PACEMAKER LEAD REMOVAL N/A 04/06/2019   Procedure: PACEMAKER LEAD REMOVAL;  Surgeon: Marinus Maw, MD;  Location: [facility] OR;  Service: Cardiovascular;  Laterality: N/A;  . PERMANENT PACEMAKER INSERTION N/A 01/04/2014   Procedure: PERMANENT PACEMAKER INSERTION;  Surgeon: Marinus Maw, MD;  Location: [facility] CATH LAB;  Service: Cardiovascular;  Laterality: N/A;  . RIGHT HEART CATHETERIZATION N/A 02/12/2014   Procedure: RIGHT HEART CATH;  Surgeon: Laurey Morale, MD;  Location: [facility] CATH LAB;  Service: Cardiovascular;  Laterality: N/A;  . TEE WITHOUT CARDIOVERSION N/A 02/12/2014    Procedure: TRANSESOPHAGEAL ECHOCARDIOGRAM (TEE);  Surgeon: Quintella Reichert, MD;  Location: [facility] ENDOSCOPY;  Service: Cardiovascular;  Laterality: N/A;  . TEE WITHOUT CARDIOVERSION N/A 04/30/2014   Procedure: TRANSESOPHAGEAL ECHOCARDIOGRAM (TEE);  Surgeon: Jake Bathe, MD;  Location: [facility] ENDOSCOPY;  Service: Cardiovascular;  Laterality: N/A;  . TRICUSPID VALVE REPLACEMENT N/A 12/30/2013   Procedure: REPAIR OF TRICUSPID VALVE LEAFLET;  Surgeon: Delight Ovens, MD;  Location: [facility] OR;  Service: Open Heart Surgery;  Laterality: N/A;  . TRICUSPID VALVE REPLACEMENT N/A 02/12/2014   Procedure: TRICUSPID VALVE REPAIR, REDO MEDIAN STERNOTOMY, REPAIR OF VENTRICULAR SEPTAL DEFECT (RECURRENT LV OUTFLOW TRACT TO RIGHT ATRIAL FISTULA), REDO CLOSURE OF PATENT FORAMEN OVALE;  Surgeon: Purcell Nails, MD;  Location: [facility] OR;  Service: Open Heart Surgery;  Laterality: N/A;  . VENOGRAM Left 04/06/2019   Procedure: Venogram;  Surgeon: Marinus Maw, MD;  Location: [facility] OR;  Service: Cardiovascular;  Laterality: Left;    Social History   Socioeconomic History  . Marital status: Married    Spouse name: Sirena  . Number of children: 2  . Years of education: 56  . Highest education level: Some college, no degree  Occupational History  . Not on file  Tobacco Use  . Smoking status: Never  Smoker  . Smokeless tobacco: Former Neurosurgeon    Types: Snuff  Substance and Sexual Activity  . Alcohol use: Yes    Alcohol/week: 0.0 standard drinks    Comment: socially  . Drug use: No  . Sexual activity: Yes    Birth control/protection: Condom  Other Topics Concern  . Not on file  Social History Narrative   Lives with wife, children   Caffeine- coffee, 1 c, 2-3 cans soda a day   Social Determinants of Health   Financial Resource Strain: Not on file  Food Insecurity: Not on file  Transportation Needs: Not on file  Physical Activity: Not on file  Stress: Not on file  Social Connections: Not on file  Intimate Partner  Violence: Not on file    Family History  Problem Relation Age of Onset  . Migraines Mother   . COPD Father   . Migraines Brother   . Heart murmur Brother   . Migraines Brother   . Migraines Maternal Grandmother   . Asthma Neg Hx     ROS: Anxiety but no fevers or chills, productive cough, hemoptysis, dysphasia, odynophagia, melena, hematochezia, dysuria, hematuria, rash, seizure activity, orthopnea, PND, pedal edema, claudication. Remaining systems are negative.  Physical Exam: Well-developed well-nourished in no acute distress.  Skin is warm and dry.  HEENT is normal.  Neck is supple.  Chest is clear to auscultation with normal expansion.  Cardiovascular exam is regular rate and rhythm.  2/6 systolic murmur left sternal border.  No diastolic murmur. Abdominal exam nontender or distended. No masses palpated. Extremities show no edema. neuro grossly intact  A/P  1 AVR/aortic root replacement-continue SBE prophylaxis.  Most recent echocardiogram showed normally functioning valve.  2 H/O mild LV dysfunction-continue beta-blocker.  3 previous VSD repair/PFO closure and tricuspid valve repair-continue SBE prophylaxis.  4 pacemaker-Per electrophysiology.  Olga Millers, MD

## 2020-06-28 ENCOUNTER — Encounter: Payer: Self-pay | Admitting: Cardiology

## 2020-06-28 ENCOUNTER — Ambulatory Visit: Payer: 59 | Admitting: Cardiology

## 2020-06-28 ENCOUNTER — Other Ambulatory Visit: Payer: Self-pay

## 2020-06-28 VITALS — BP 110/64 | HR 83 | Ht 65.0 in | Wt 236.4 lb

## 2020-06-28 DIAGNOSIS — Z954 Presence of other heart-valve replacement: Secondary | ICD-10-CM

## 2020-06-28 DIAGNOSIS — I428 Other cardiomyopathies: Secondary | ICD-10-CM

## 2020-06-28 DIAGNOSIS — Z950 Presence of cardiac pacemaker: Secondary | ICD-10-CM

## 2020-06-28 NOTE — Patient Instructions (Signed)

## 2020-07-04 ENCOUNTER — Ambulatory Visit (INDEPENDENT_AMBULATORY_CARE_PROVIDER_SITE_OTHER): Payer: 59

## 2020-07-04 DIAGNOSIS — I442 Atrioventricular block, complete: Secondary | ICD-10-CM

## 2020-07-05 LAB — CUP PACEART REMOTE DEVICE CHECK
Battery Remaining Longevity: 134 mo
Battery Voltage: 3.03 V
Brady Statistic AP VP Percent: 14.99 %
Brady Statistic AP VS Percent: 0 %
Brady Statistic AS VP Percent: 84.99 %
Brady Statistic AS VS Percent: 0.01 %
Brady Statistic RA Percent Paced: 14.97 %
Brady Statistic RV Percent Paced: 99.99 %
Date Time Interrogation Session: 20220522230906
Implantable Lead Implant Date: 20151123
Implantable Lead Implant Date: 20210222
Implantable Lead Location: 753859
Implantable Lead Location: 753860
Implantable Lead Model: 5076
Implantable Lead Model: 5076
Implantable Pulse Generator Implant Date: 20210222
Lead Channel Impedance Value: 323 Ohm
Lead Channel Impedance Value: 361 Ohm
Lead Channel Impedance Value: 551 Ohm
Lead Channel Impedance Value: 798 Ohm
Lead Channel Pacing Threshold Amplitude: 0.625 V
Lead Channel Pacing Threshold Amplitude: 0.875 V
Lead Channel Pacing Threshold Pulse Width: 0.4 ms
Lead Channel Pacing Threshold Pulse Width: 0.4 ms
Lead Channel Sensing Intrinsic Amplitude: 1.625 mV
Lead Channel Sensing Intrinsic Amplitude: 1.625 mV
Lead Channel Sensing Intrinsic Amplitude: 7.875 mV
Lead Channel Sensing Intrinsic Amplitude: 7.875 mV
Lead Channel Setting Pacing Amplitude: 1.5 V
Lead Channel Setting Pacing Amplitude: 2.5 V
Lead Channel Setting Pacing Pulse Width: 0.4 ms
Lead Channel Setting Sensing Sensitivity: 5.6 mV

## 2020-07-22 NOTE — Progress Notes (Signed)
Remote pacemaker transmission.   

## 2020-10-03 ENCOUNTER — Ambulatory Visit (INDEPENDENT_AMBULATORY_CARE_PROVIDER_SITE_OTHER): Payer: 59

## 2020-10-03 DIAGNOSIS — I442 Atrioventricular block, complete: Secondary | ICD-10-CM | POA: Diagnosis not present

## 2020-10-03 LAB — CUP PACEART REMOTE DEVICE CHECK
Battery Remaining Longevity: 128 mo
Battery Voltage: 3.02 V
Brady Statistic AP VP Percent: 13.97 %
Brady Statistic AP VS Percent: 0 %
Brady Statistic AS VP Percent: 86.02 %
Brady Statistic AS VS Percent: 0.01 %
Brady Statistic RA Percent Paced: 13.94 %
Brady Statistic RV Percent Paced: 99.99 %
Date Time Interrogation Session: 20220821183422
Implantable Lead Implant Date: 20151123
Implantable Lead Implant Date: 20210222
Implantable Lead Location: 753859
Implantable Lead Location: 753860
Implantable Lead Model: 5076
Implantable Lead Model: 5076
Implantable Pulse Generator Implant Date: 20210222
Lead Channel Impedance Value: 323 Ohm
Lead Channel Impedance Value: 342 Ohm
Lead Channel Impedance Value: 513 Ohm
Lead Channel Impedance Value: 722 Ohm
Lead Channel Pacing Threshold Amplitude: 0.5 V
Lead Channel Pacing Threshold Amplitude: 0.625 V
Lead Channel Pacing Threshold Pulse Width: 0.4 ms
Lead Channel Pacing Threshold Pulse Width: 0.4 ms
Lead Channel Sensing Intrinsic Amplitude: 1.5 mV
Lead Channel Sensing Intrinsic Amplitude: 1.5 mV
Lead Channel Sensing Intrinsic Amplitude: 7.875 mV
Lead Channel Sensing Intrinsic Amplitude: 7.875 mV
Lead Channel Setting Pacing Amplitude: 1.5 V
Lead Channel Setting Pacing Amplitude: 2.5 V
Lead Channel Setting Pacing Pulse Width: 0.4 ms
Lead Channel Setting Sensing Sensitivity: 5.6 mV

## 2020-10-19 NOTE — Progress Notes (Signed)
Remote pacemaker transmission.   

## 2020-10-31 ENCOUNTER — Telehealth: Payer: Self-pay | Admitting: Cardiology

## 2020-10-31 NOTE — Telephone Encounter (Signed)
Spoke to patient .  Chest tightness with a cough  - Sharp pain  midsternum.  No phlegm noted with cough , no fever  . Lightheaded occasionally. Patient does not have blood pressure cuff,  patient does have  Fitbit to check pulse  not much changes has with heart rate.  Patient has  pacemaker. Patient states he is not sure if this anxiety or not .   He states recently he has thought s of  morality since he his been married and have 2 children.  Patient states when he goes to his primary they always refer him back to cardiology. RN suggest  patient to schedule appointment to rule out  cardiac and once rule out  an appointment can be made for primary to follow up about anxiety and /or therapy for coping mechanism. Patient verbalized understanding and in agreement.

## 2020-10-31 NOTE — Telephone Encounter (Signed)
Pt sent this via Mychart message to the sching pool:   Also feel overly tired  No nitroglycerin   The chest pain has been consistent for about a week. Had some pretty bad back pain a week prior. Sharp pain when I cough but other than that just light Headed and dizzy.  It's not the first time I have felt like this and everytime I come in everything seems to check out so I was trying to just ignore it to see if it would go away. It might be me just panicking or feeling anxious... But it's not going away.     Good Morning [name]  Can you tell me a little more about her chest Discomfort   1. Are you having CP right now?   2. Are you experiencing any other symptoms (ex. SOB, nausea, vomiting, sweating)?   3. How long have you been experiencing CP?   4. Is your CP continuous or coming and going?   5. Have you taken Nitroglycerin?     Comments: Some chest discomfort, chest tightness, lightheaded and dizzy.

## 2020-11-02 NOTE — Progress Notes (Signed)
HPI: FU AVR; initially seen in Nov 2015 with "flu" that ended up being bacterial endocarditis. He underwent aortic root and valve replacement with fistula repair. At that time he also had CHB and had a MDT PTVDP placed. Jan 2016 he had symptoms of dyspnea and was reevaluated. A TEE was done and and revealed a large VSD with left to right shunting from LVOT. On 02/12/2014  he underwent Redo Median Sternotomy, Repair of a VSD, Tricuspid Valve Repair, and Redo closure of PFO. Patient had a repeat echocardiogram on 04/29/2014 that showed mild global reduction in LV function. There was a mobile mass on the aortic valve concerning for vegetation as well as mild to moderate aortic insufficiency. The patient was admitted and IV antibiotics resumed. Blood cultures remained negative. Transesophageal echocardiogram revealed low normal LV function and an aortic valve mass. 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 Patient was evaluated by Dr. Tyrone Sage and medical therapy recommended. Echocardiogram February 2022 showed ejection fraction 45 to 50%, grade 2 diastolic dysfunction, previous VSD repair with no residual flow, mild RV dysfunction, status post aortic valve replacement with mean gradient 19 mmHg, mild aortic insufficiencies, previous tricuspid valve repair with trace tricuspid regurgitation and mean gradient 5 mmHg.  CTA April 2022 showed no pulmonary embolus.  Patient called the office with chest pain and added to my schedule today.  Since last seen, he has dyspnea with more vigorous activities but not routine activities.  No orthopnea, PND, pedal edema or syncope.  Over the past 2 weeks he has noticed occasional chest discomfort.  It is in the chest radiating to the shoulders and back.  It increases with certain movements and inspiration.  Lasts several seconds at a time.  Note patient denies fevers or chills.  Current Outpatient Medications  Medication Sig Dispense Refill    acetaminophen (TYLENOL) 500 MG tablet Take 1,000 mg by mouth every 6 (six) hours as needed for moderate pain or headache.     aspirin EC 81 MG tablet Take 81 mg by mouth daily.      metoprolol succinate (TOPROL-XL) 50 MG 24 hr tablet Take 1 tablet (50 mg total) by mouth daily. Take with or immediately following a meal. 90 tablet 3   amoxicillin (AMOXIL) 500 MG tablet Take all 4 tablets 1 hour prior to dental work (Patient not taking: Reported on 11/03/2020) 4 tablet 6   aspirin-acetaminophen-caffeine (EXCEDRIN MIGRAINE) 250-250-65 MG tablet Take by mouth every 6 (six) hours as needed for headache. (Patient not taking: Reported on 11/03/2020)     bismuth subsalicylate (PEPTO BISMOL) 262 MG/15ML suspension Take 30 mLs by mouth every 6 (six) hours as needed for indigestion or diarrhea or loose stools. (Patient not taking: Reported on 11/03/2020)     No current facility-administered medications for this visit.     Past Medical History:  Diagnosis Date   AKI (acute kidney injury) (HCC)    Aortic valve vegetation on echo 04/29/14 04/29/2014   managed medically   Brain abscess    Chest pain 03/02/2020   Congestive dilated cardiomyopathy (HCC) 06/23/2015   Drug-induced leukopenia (HCC) 06/15/2014   Endocarditis of aortic valve-November 2015    Fracture of left femur (HCC)    History of open heart surgery    December 30, 2013- ascending aortic root replacement, TEE   Lightheaded 03/02/2020   Meningitis    Group B Strep (November 2015)   Migraine headache 10/15/2019   since 2008  Obesity    Patent foramen ovale    Closed during procedure on December 30, 2013   Presence of permanent cardiac pacemaker 01/04/2014   PPM MEDTRONIC FOR COMPLETE HEART BLOCK   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   S/P aortic root and valve allograft 12/30/2013   Human allograft aortic root replacement with repair of aorta-right atrial fistula and tricuspid valve repair   S/P placement of cardiac pacemaker  01/04/2014   Medtronic (serial number [id number]) dual chamber permanent pacemaker implanted by Dr Ladona Ridgel   S/P redo patent foramen ovale closure 02/12/2014   S/P redo tricuspid valve repair 02/12/2014   Complex valvuloplasty including patch closure of VSD with plication of septal leaflet and 26 mm Edwards mc3 ring annuloplasty   S/P ventricular septal defect repair + redo tricuspid valve repair + redo closure PFO 02/12/2014   Redo sternotomy for bovine pericardial patch repair of large ventricular septal defect (recurrent LVOT to RA fistula due to bacterial endocarditis)   Smokeless tobacco use    Third degree heart block (HCC) 12/31/2013   Thrombocytopenia (HCC) 12/2013   Tricuspid regurgitation 02/11/2014   Ventricular septal defect 02/12/2014   Post-infectious s/p repair of LVOT to RA fistula for bacterial endocarditis    Past Surgical History:  Procedure Laterality Date   ASCENDING AORTIC ROOT REPLACEMENT N/A 12/30/2013   Procedure: ASCENDING AORTIC ROOT REPLACEMENT with 33mm HOMOGRAFT, CLOSURE OF AORTIC TO RIGHT ATRIAL FISTULA, CLOSURE OF PATENT FORAMEN OVALE;  Surgeon: Delight Ovens, MD;  Location: [facility] OR;  Service: Open Heart Surgery;  Laterality: N/A;   INSERT / REPLACE / REMOVE PACEMAKER  01/04/2014   INTRAOPERATIVE TRANSESOPHAGEAL ECHOCARDIOGRAM N/A 12/30/2013   Procedure: INTRAOPERATIVE TRANSESOPHAGEAL ECHOCARDIOGRAM;  Surgeon: Delight Ovens, MD;  Location: [facility] OR;  Service: Open Heart Surgery;  Laterality: N/A;   PACEMAKER INSERTION Left 04/06/2019   Procedure: PACEMAKER LEAD INSERTION;  Surgeon: Marinus Maw, MD;  Location: [facility] OR;  Service: Cardiovascular;  Laterality: Left;   PACEMAKER LEAD REMOVAL N/A 04/06/2019   Procedure: PACEMAKER LEAD REMOVAL;  Surgeon: Marinus Maw, MD;  Location: [facility] OR;  Service: Cardiovascular;  Laterality: N/A;   PERMANENT PACEMAKER INSERTION N/A 01/04/2014   Procedure: PERMANENT PACEMAKER INSERTION;  Surgeon: Marinus Maw, MD;  Location: [facility]  CATH LAB;  Service: Cardiovascular;  Laterality: N/A;   RIGHT HEART CATHETERIZATION N/A 02/12/2014   Procedure: RIGHT HEART CATH;  Surgeon: Laurey Morale, MD;  Location: [facility] CATH LAB;  Service: Cardiovascular;  Laterality: N/A;   TEE WITHOUT CARDIOVERSION N/A 02/12/2014   Procedure: TRANSESOPHAGEAL ECHOCARDIOGRAM (TEE);  Surgeon: Quintella Reichert, MD;  Location: [facility] ENDOSCOPY;  Service: Cardiovascular;  Laterality: N/A;   TEE WITHOUT CARDIOVERSION N/A 04/30/2014   Procedure: TRANSESOPHAGEAL ECHOCARDIOGRAM (TEE);  Surgeon: Jake Bathe, MD;  Location: [facility] ENDOSCOPY;  Service: Cardiovascular;  Laterality: N/A;   TRICUSPID VALVE REPLACEMENT N/A 12/30/2013   Procedure: REPAIR OF TRICUSPID VALVE LEAFLET;  Surgeon: Delight Ovens, MD;  Location: [facility] OR;  Service: Open Heart Surgery;  Laterality: N/A;   TRICUSPID VALVE REPLACEMENT N/A 02/12/2014   Procedure: TRICUSPID VALVE REPAIR, REDO MEDIAN STERNOTOMY, REPAIR OF VENTRICULAR SEPTAL DEFECT (RECURRENT LV OUTFLOW TRACT TO RIGHT ATRIAL FISTULA), REDO CLOSURE OF PATENT FORAMEN OVALE;  Surgeon: Purcell Nails, MD;  Location: [facility] OR;  Service: Open Heart Surgery;  Laterality: N/A;   VENOGRAM Left 04/06/2019   Procedure: Venogram;  Surgeon: Marinus Maw, MD;  Location: [facility] OR;  Service: Cardiovascular;  Laterality: Left;    Social  History   Socioeconomic History   Marital status: Married    Spouse name: Sirena   Number of children: 2   Years of education: 12   Highest education level: Some college, no degree  Occupational History   Not on file  Tobacco Use   Smoking status: Never   Smokeless tobacco: Former    Types: Snuff    Quit date: 12/30/2013  Substance and Sexual Activity   Alcohol use: Yes    Alcohol/week: 0.0 standard drinks    Comment: socially   Drug use: No   Sexual activity: Yes    Birth control/protection: Condom  Other Topics Concern   Not on file  Social History Narrative   Lives with wife, children   Caffeine- coffee, 1 c, 2-3  cans soda a day   Social Determinants of Health   Financial Resource Strain: Not on file  Food Insecurity: Not on file  Transportation Needs: Not on file  Physical Activity: Not on file  Stress: Not on file  Social Connections: Not on file  Intimate Partner Violence: Not on file    Family History  Problem Relation Age of Onset   Migraines Mother    COPD Father    Migraines Brother    Heart murmur Brother    Migraines Brother    Migraines Maternal Grandmother    Asthma Neg Hx     ROS: no fevers or chills, productive cough, hemoptysis, dysphasia, odynophagia, melena, hematochezia, dysuria, hematuria, rash, seizure activity, orthopnea, PND, pedal edema, claudication. Remaining systems are negative.  Physical Exam: Well-developed well-nourished in no acute distress.  Skin is warm and dry.  HEENT is normal.  Neck is supple.  Chest is clear to auscultation with normal expansion.  Cardiovascular exam is regular rate and rhythm.  2/6 systolic murmur upper right sternal border.  No diastolic murmur. Abdominal exam nontender or distended. No masses palpated. Extremities show no edema. neuro grossly intact  ECG-sinus rhythm with ventricular pacing.  Personally reviewed  A/P  1 chest pain-symptoms atypical lasting seconds and increasing with certain movements.  Likely musculoskeletal.  Will not pursue further evaluation at this point.  2 history of aortic valve replacement/aortic root replacement-continue SBE prophylaxis.  3 previous VSD repair/PFO closure as well as tricuspid valve repair-continue SBE prophylaxis.  4 mild LV dysfunction-continue beta-blocker.  5 pacemaker-Per electrophysiology.  Olga Millers, MD

## 2020-11-03 ENCOUNTER — Encounter: Payer: Self-pay | Admitting: Cardiology

## 2020-11-03 ENCOUNTER — Other Ambulatory Visit: Payer: Self-pay

## 2020-11-03 ENCOUNTER — Ambulatory Visit: Payer: 59 | Admitting: Cardiology

## 2020-11-03 VITALS — BP 116/78 | HR 65 | Ht 65.0 in | Wt 231.2 lb

## 2020-11-03 DIAGNOSIS — I428 Other cardiomyopathies: Secondary | ICD-10-CM | POA: Diagnosis not present

## 2020-11-03 DIAGNOSIS — Z950 Presence of cardiac pacemaker: Secondary | ICD-10-CM

## 2020-11-03 DIAGNOSIS — Z954 Presence of other heart-valve replacement: Secondary | ICD-10-CM | POA: Diagnosis not present

## 2020-11-03 DIAGNOSIS — R0789 Other chest pain: Secondary | ICD-10-CM

## 2020-11-03 NOTE — Patient Instructions (Signed)

## 2020-11-07 ENCOUNTER — Ambulatory Visit: Payer: 59 | Admitting: Physician Assistant

## 2020-11-10 NOTE — Telephone Encounter (Signed)
-----   Message from Marinus Maw, MD sent at 11/09/2020 10:57 PM EDT ----- Regarding: RE: No other thoughts. If the sensation of an increased heart rate is bothersome, taking an extra beta blocker might be helpful. GT ----- Message ----- From: Lenor Coffin, RN Sent: 11/07/2020  11:46 AM EDT To: Marinus Maw, MD  Please advise if any changes. Remote on 11/04/20 was normal.

## 2020-11-10 NOTE — Telephone Encounter (Signed)
Called patient and advised of Dr. Bruna Potter recommendations. Patient was inquring if he needed stress test and would like to know more about the procedure. Advised patient I will forward to Dr. Bruna Potter nurse.

## 2021-01-02 ENCOUNTER — Ambulatory Visit (INDEPENDENT_AMBULATORY_CARE_PROVIDER_SITE_OTHER): Payer: 59

## 2021-01-02 DIAGNOSIS — I428 Other cardiomyopathies: Secondary | ICD-10-CM | POA: Diagnosis not present

## 2021-01-02 LAB — CUP PACEART REMOTE DEVICE CHECK
Battery Remaining Longevity: 124 mo
Battery Voltage: 3.02 V
Brady Statistic AP VP Percent: 6.04 %
Brady Statistic AP VS Percent: 0 %
Brady Statistic AS VP Percent: 93.95 %
Brady Statistic AS VS Percent: 0.01 %
Brady Statistic RA Percent Paced: 6.02 %
Brady Statistic RV Percent Paced: 99.99 %
Date Time Interrogation Session: 20221120225653
Implantable Lead Implant Date: 20151123
Implantable Lead Implant Date: 20210222
Implantable Lead Location: 753859
Implantable Lead Location: 753860
Implantable Lead Model: 5076
Implantable Lead Model: 5076
Implantable Pulse Generator Implant Date: 20210222
Lead Channel Impedance Value: 323 Ohm
Lead Channel Impedance Value: 361 Ohm
Lead Channel Impedance Value: 532 Ohm
Lead Channel Impedance Value: 684 Ohm
Lead Channel Pacing Threshold Amplitude: 0.625 V
Lead Channel Pacing Threshold Amplitude: 0.625 V
Lead Channel Pacing Threshold Pulse Width: 0.4 ms
Lead Channel Pacing Threshold Pulse Width: 0.4 ms
Lead Channel Sensing Intrinsic Amplitude: 1.5 mV
Lead Channel Sensing Intrinsic Amplitude: 1.5 mV
Lead Channel Sensing Intrinsic Amplitude: 7.875 mV
Lead Channel Sensing Intrinsic Amplitude: 7.875 mV
Lead Channel Setting Pacing Amplitude: 1.5 V
Lead Channel Setting Pacing Amplitude: 2.5 V
Lead Channel Setting Pacing Pulse Width: 0.4 ms
Lead Channel Setting Sensing Sensitivity: 5.6 mV

## 2021-01-10 NOTE — Progress Notes (Signed)
Remote pacemaker transmission.   

## 2021-01-23 ENCOUNTER — Encounter: Payer: Self-pay | Admitting: Internal Medicine

## 2021-01-23 ENCOUNTER — Encounter: Payer: Self-pay | Admitting: Cardiology

## 2021-01-23 ENCOUNTER — Encounter: Payer: Self-pay | Admitting: Infectious Disease

## 2021-02-11 ENCOUNTER — Telehealth: Payer: Self-pay | Admitting: Medical

## 2021-02-11 MED ORDER — METOPROLOL SUCCINATE ER 50 MG PO TB24: 50.0000 mg | ORAL_TABLET | Freq: Every day | ORAL | 3 refills | Status: DC

## 2021-02-11 NOTE — Telephone Encounter (Signed)
° °  Patient called after hours line requesting refill of metoprolol succinate. Refill sent to preferred pharmacy. Patient was appreciative of the call.  Beatriz Stallion, PA-C 02/11/21; 5:32 PM

## 2021-02-24 ENCOUNTER — Other Ambulatory Visit: Payer: Self-pay | Admitting: Internal Medicine

## 2021-02-24 DIAGNOSIS — R0609 Other forms of dyspnea: Secondary | ICD-10-CM

## 2021-04-03 ENCOUNTER — Ambulatory Visit (INDEPENDENT_AMBULATORY_CARE_PROVIDER_SITE_OTHER): Payer: 59

## 2021-04-03 DIAGNOSIS — I428 Other cardiomyopathies: Secondary | ICD-10-CM | POA: Diagnosis not present

## 2021-04-03 LAB — CUP PACEART REMOTE DEVICE CHECK
Battery Remaining Longevity: 120 mo
Battery Voltage: 3.02 V
Brady Statistic AP VP Percent: 5.96 %
Brady Statistic AP VS Percent: 0 %
Brady Statistic AS VP Percent: 94.03 %
Brady Statistic AS VS Percent: 0.01 %
Brady Statistic RA Percent Paced: 5.94 %
Brady Statistic RV Percent Paced: 99.99 %
Date Time Interrogation Session: 20230219200827
Implantable Lead Implant Date: 20151123
Implantable Lead Implant Date: 20210222
Implantable Lead Location: 753859
Implantable Lead Location: 753860
Implantable Lead Model: 5076
Implantable Lead Model: 5076
Implantable Pulse Generator Implant Date: 20210222
Lead Channel Impedance Value: 304 Ohm
Lead Channel Impedance Value: 342 Ohm
Lead Channel Impedance Value: 494 Ohm
Lead Channel Impedance Value: 646 Ohm
Lead Channel Pacing Threshold Amplitude: 0.625 V
Lead Channel Pacing Threshold Amplitude: 0.625 V
Lead Channel Pacing Threshold Pulse Width: 0.4 ms
Lead Channel Pacing Threshold Pulse Width: 0.4 ms
Lead Channel Sensing Intrinsic Amplitude: 1.25 mV
Lead Channel Sensing Intrinsic Amplitude: 1.25 mV
Lead Channel Sensing Intrinsic Amplitude: 7.875 mV
Lead Channel Sensing Intrinsic Amplitude: 7.875 mV
Lead Channel Setting Pacing Amplitude: 1.5 V
Lead Channel Setting Pacing Amplitude: 2.5 V
Lead Channel Setting Pacing Pulse Width: 0.4 ms
Lead Channel Setting Sensing Sensitivity: 5.6 mV

## 2021-04-06 NOTE — Progress Notes (Signed)
Remote pacemaker transmission.   

## 2021-04-27 NOTE — Progress Notes (Deleted)
? ? ? ? ?HPI:FU AVR; initially seen in Nov 2015 with "flu" that ended up being bacterial endocarditis. He underwent aortic root and valve replacement with fistula repair. At that time he also had CHB and had a MDT PTVDP placed. Jan 2016 he had symptoms of dyspnea and was reevaluated. A TEE was done and and revealed a large VSD with left to right shunting from LVOT. On 02/12/2014  he underwent Redo Median Sternotomy, Repair of a VSD, Tricuspid Valve Repair, and Redo closure of PFO. Patient had a repeat echocardiogram on 04/29/2014 that showed mild global reduction in LV function. There was a mobile mass on the aortic valve concerning for vegetation as well as mild to moderate aortic insufficiency. The patient was admitted and IV antibiotics resumed. Blood cultures remained negative. Transesophageal echocardiogram revealed low normal LV function and an aortic valve mass. 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 Patient was evaluated by Dr. Tyrone Sage and medical therapy recommended. Echocardiogram February 2022 showed ejection fraction 45 to 50%, grade 2 diastolic dysfunction, previous VSD repair with no residual flow, mild RV dysfunction, status post aortic valve replacement with mean gradient 19 mmHg, mild aortic insufficiencies, previous tricuspid valve repair with trace tricuspid regurgitation and mean gradient 5 mmHg.  CTA April 2022 showed no pulmonary embolus. Since last seen,  ? ?Current Outpatient Medications  ?Medication Sig Dispense Refill  ? acetaminophen (TYLENOL) 500 MG tablet Take 1,000 mg by mouth every 6 (six) hours as needed for moderate pain or headache.    ? amoxicillin (AMOXIL) 500 MG tablet Take all 4 tablets 1 hour prior to dental work (Patient not taking: Reported on 11/03/2020) 4 tablet 6  ? aspirin EC 81 MG tablet Take 81 mg by mouth daily.     ? aspirin-acetaminophen-caffeine (EXCEDRIN MIGRAINE) 250-250-65 MG tablet Take by mouth every 6 (six) hours as needed for  headache. (Patient not taking: Reported on 11/03/2020)    ? bismuth subsalicylate (PEPTO BISMOL) 262 MG/15ML suspension Take 30 mLs by mouth every 6 (six) hours as needed for indigestion or diarrhea or loose stools. (Patient not taking: Reported on 11/03/2020)    ? metoprolol succinate (TOPROL-XL) 50 MG 24 hr tablet Take 1 tablet (50 mg total) by mouth daily. Take with or immediately following a meal. 90 tablet 3  ? ?No current facility-administered medications for this visit.  ? ? ? ?Past Medical History:  ?Diagnosis Date  ? AKI (acute kidney injury) (HCC)   ? Aortic valve vegetation on echo 04/29/14 04/29/2014  ? managed medically  ? Brain abscess   ? Chest pain 03/02/2020  ? Congestive dilated cardiomyopathy (HCC) 06/23/2015  ? Drug-induced leukopenia (HCC) 06/15/2014  ? Endocarditis of aortic valve-November 2015   ? Fracture of left femur (HCC)   ? History of open heart surgery   ? December 30, 2013- ascending aortic root replacement, TEE  ? Lightheaded 03/02/2020  ? Meningitis   ? Group B Strep (November 2015)  ? Migraine headache 10/15/2019  ? since 2008  ? Obesity   ? Patent foramen ovale   ? Closed during procedure on December 30, 2013  ? Presence of permanent cardiac pacemaker 01/04/2014  ? PPM MEDTRONIC FOR COMPLETE HEART BLOCK  ? 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 S/P aortic root and valve allograft 12/30/2013  ? Human allograft aortic root replacement with repair of aorta-right atrial fistula and tricuspid valve repair  ? S/P placement of cardiac pacemaker 01/04/2014  ? Medtronic (serial number [id number])  dual chamber permanent pacemaker implanted by Dr Ladona Ridgel  ? S/P redo patent foramen ovale closure 02/12/2014  ? S/P redo tricuspid valve repair 02/12/2014  ? Complex valvuloplasty including patch closure of VSD with plication of septal leaflet and 26 mm Edwards mc3 ring annuloplasty  ? S/P ventricular septal defect repair + redo tricuspid valve repair + redo closure PFO 02/12/2014  ? Redo sternotomy for  bovine pericardial patch repair of large ventricular septal defect (recurrent LVOT to RA fistula due to bacterial endocarditis)  ? Smokeless tobacco use   ? Third degree heart block (HCC) 12/31/2013  ? Thrombocytopenia (HCC) 12/2013  ? Tricuspid regurgitation 02/11/2014  ? Ventricular septal defect 02/12/2014  ? Post-infectious s/p repair of LVOT to RA fistula for bacterial endocarditis  ? ? ?Past Surgical History:  ?Procedure Laterality Date  ? ASCENDING AORTIC ROOT REPLACEMENT N/A 12/30/2013  ? Procedure: ASCENDING AORTIC ROOT REPLACEMENT with 68mm HOMOGRAFT, CLOSURE OF AORTIC TO RIGHT ATRIAL FISTULA, CLOSURE OF PATENT FORAMEN OVALE;  Surgeon: Delight Ovens, MD;  Location: [facility] OR;  Service: Open Heart Surgery;  Laterality: N/A;  ? INSERT / REPLACE / REMOVE PACEMAKER  01/04/2014  ? INTRAOPERATIVE TRANSESOPHAGEAL ECHOCARDIOGRAM N/A 12/30/2013  ? Procedure: INTRAOPERATIVE TRANSESOPHAGEAL ECHOCARDIOGRAM;  Surgeon: Delight Ovens, MD;  Location: [facility] OR;  Service: Open Heart Surgery;  Laterality: N/A;  ? PACEMAKER INSERTION Left 04/06/2019  ? Procedure: PACEMAKER LEAD INSERTION;  Surgeon: Marinus Maw, MD;  Location: [facility] OR;  Service: Cardiovascular;  Laterality: Left;  ? PACEMAKER LEAD REMOVAL N/A 04/06/2019  ? Procedure: PACEMAKER LEAD REMOVAL;  Surgeon: Marinus Maw, MD;  Location: [facility] OR;  Service: Cardiovascular;  Laterality: N/A;  ? PERMANENT PACEMAKER INSERTION N/A 01/04/2014  ? Procedure: PERMANENT PACEMAKER INSERTION;  Surgeon: Marinus Maw, MD;  Location: [facility] CATH LAB;  Service: Cardiovascular;  Laterality: N/A;  ? RIGHT HEART CATHETERIZATION N/A 02/12/2014  ? Procedure: RIGHT HEART CATH;  Surgeon: Laurey Morale, MD;  Location: [facility] CATH LAB;  Service: Cardiovascular;  Laterality: N/A;  ? TEE WITHOUT CARDIOVERSION N/A 02/12/2014  ? Procedure: TRANSESOPHAGEAL ECHOCARDIOGRAM (TEE);  Surgeon: Quintella Reichert, MD;  Location: [facility] ENDOSCOPY;  Service: Cardiovascular;  Laterality: N/A;  ? TEE WITHOUT CARDIOVERSION  N/A 04/30/2014  ? Procedure: TRANSESOPHAGEAL ECHOCARDIOGRAM (TEE);  Surgeon: Jake Bathe, MD;  Location: [facility] ENDOSCOPY;  Service: Cardiovascular;  Laterality: N/A;  ? TRICUSPID VALVE REPLACEMENT N/A 12/30/2013  ? Procedure: REPAIR OF TRICUSPID VALVE LEAFLET;  Surgeon: Delight Ovens, MD;  Location: [facility] OR;  Service: Open Heart Surgery;  Laterality: N/A;  ? TRICUSPID VALVE REPLACEMENT N/A 02/12/2014  ? Procedure: TRICUSPID VALVE REPAIR, REDO MEDIAN STERNOTOMY, REPAIR OF VENTRICULAR SEPTAL DEFECT (RECURRENT LV OUTFLOW TRACT TO RIGHT ATRIAL FISTULA), REDO CLOSURE OF PATENT FORAMEN OVALE;  Surgeon: Purcell Nails, MD;  Location: [facility] OR;  Service: Open Heart Surgery;  Laterality: N/A;  ? VENOGRAM Left 04/06/2019  ? Procedure: Venogram;  Surgeon: Marinus Maw, MD;  Location: [facility] OR;  Service: Cardiovascular;  Laterality: Left;  ? ? ?Social History  ? ?Socioeconomic History  ? Marital status: Married  ?  Spouse name: Cam Hai  ? Number of children: 2  ? Years of education: 73  ? Highest education level: Some college, no degree  ?Occupational History  ? Not on file  ?Tobacco Use  ? Smoking status: Never  ? Smokeless tobacco: Former  ?  Types: Snuff  ?  Quit date: 12/30/2013  ?Substance and Sexual Activity  ? Alcohol use: Yes  ?  Alcohol/week: 0.0 standard drinks  ?  Comment: socially  ? Drug use: No  ? Sexual activity: Yes  ?  Birth control/protection: Condom  ?Other Topics Concern  ? Not on file  ?Social History Narrative  ? Lives with wife, children  ? Caffeine- coffee, 1 c, 2-3 cans soda a day  ? ?Social Determinants of Health  ? ?Financial Resource Strain: Not on file  ?Food Insecurity: Not on file  ?Transportation Needs: Not on file  ?Physical Activity: Not on file  ?Stress: Not on file  ?Social Connections: Not on file  ?Intimate Partner Violence: Not on file  ? ? ?Family History  ?Problem Relation Age of Onset  ? Migraines Mother   ? COPD Father   ? Migraines Brother   ? Heart murmur Brother   ? Migraines Brother   ?  Migraines Maternal Grandmother   ? Asthma Neg Hx   ? ? ?ROS: no fevers or chills, productive cough, hemoptysis, dysphasia, odynophagia, melena, hematochezia, dysuria, hematuria, rash, seizure activity,

## 2021-05-04 ENCOUNTER — Ambulatory Visit: Payer: 59 | Admitting: Cardiology

## 2021-05-08 ENCOUNTER — Encounter: Payer: Self-pay | Admitting: Cardiology

## 2021-05-16 ENCOUNTER — Other Ambulatory Visit: Payer: Self-pay | Admitting: Cardiology

## 2021-06-05 NOTE — Progress Notes (Signed)
? ? ?Office Visit  ?  ?Patient Name: [name] ?Date of Encounter: 06/07/2021 ? ?Primary Care Provider:  [name], MD ?Primary Cardiologist:  Olga Millers, MD ? ?Chief Complaint  ?  ?36 year old male with a history of bacterial endocarditis s/p aortic root and valve replacement with fistula repair in 2015, VSD, tricuspid valve regurgitation s/p repair of VSD, tricuspid valve repair, closure of PFO in 2016, CHB s/p PPM, atypical chest pain, mild LV dysfunction, thrombocytopenia, and migraine headaches who presents for follow-up related to chest pain and valvular heart disease.  ? ?Past Medical History  ?  ?Past Medical History:  ?Diagnosis Date  ? AKI (acute kidney injury) (HCC)   ? Aortic valve vegetation on echo 04/29/14 04/29/2014  ? managed medically  ? Brain abscess   ? Chest pain 03/02/2020  ? Congestive dilated cardiomyopathy (HCC) 06/23/2015  ? Drug-induced leukopenia (HCC) 06/15/2014  ? Endocarditis of aortic valve-November 2015   ? Fracture of left femur (HCC)   ? History of open heart surgery   ? December 30, 2013- ascending aortic root replacement, TEE  ? Lightheaded 03/02/2020  ? Meningitis   ? Group B Strep (November 2015)  ? Migraine headache 10/15/2019  ? since 2008  ? Obesity   ? Patent foramen ovale   ? Closed during procedure on December 30, 2013  ? Presence of permanent cardiac pacemaker 01/04/2014  ? PPM MEDTRONIC FOR COMPLETE HEART BLOCK  ? 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 S/P aortic root and valve allograft 12/30/2013  ? Human allograft aortic root replacement with repair of aorta-right atrial fistula and tricuspid valve repair  ? S/P placement of cardiac pacemaker 01/04/2014  ? Medtronic (serial number [id number]) dual chamber permanent pacemaker implanted by Dr Ladona Ridgel  ? S/P redo patent foramen ovale closure 02/12/2014  ? S/P redo tricuspid valve repair 02/12/2014  ? Complex valvuloplasty including patch closure of VSD with plication of septal leaflet and 26 mm Edwards mc3 ring  annuloplasty  ? S/P ventricular septal defect repair + redo tricuspid valve repair + redo closure PFO 02/12/2014  ? Redo sternotomy for bovine pericardial patch repair of large ventricular septal defect (recurrent LVOT to RA fistula due to bacterial endocarditis)  ? Smokeless tobacco use   ? Third degree heart block (HCC) 12/31/2013  ? Thrombocytopenia (HCC) 12/2013  ? Tricuspid regurgitation 02/11/2014  ? Ventricular septal defect 02/12/2014  ? Post-infectious s/p repair of LVOT to RA fistula for bacterial endocarditis  ? ?Past Surgical History:  ?Procedure Laterality Date  ? ASCENDING AORTIC ROOT REPLACEMENT N/A 12/30/2013  ? Procedure: ASCENDING AORTIC ROOT REPLACEMENT with 11mm HOMOGRAFT, CLOSURE OF AORTIC TO RIGHT ATRIAL FISTULA, CLOSURE OF PATENT FORAMEN OVALE;  Surgeon: Delight Ovens, MD;  Location: [facility] OR;  Service: Open Heart Surgery;  Laterality: N/A;  ? INSERT / REPLACE / REMOVE PACEMAKER  01/04/2014  ? INTRAOPERATIVE TRANSESOPHAGEAL ECHOCARDIOGRAM N/A 12/30/2013  ? Procedure: INTRAOPERATIVE TRANSESOPHAGEAL ECHOCARDIOGRAM;  Surgeon: Delight Ovens, MD;  Location: [facility] OR;  Service: Open Heart Surgery;  Laterality: N/A;  ? PACEMAKER INSERTION Left 04/06/2019  ? Procedure: PACEMAKER LEAD INSERTION;  Surgeon: Marinus Maw, MD;  Location: [facility] OR;  Service: Cardiovascular;  Laterality: Left;  ? PACEMAKER LEAD REMOVAL N/A 04/06/2019  ? Procedure: PACEMAKER LEAD REMOVAL;  Surgeon: Marinus Maw, MD;  Location: [facility] OR;  Service: Cardiovascular;  Laterality: N/A;  ? PERMANENT PACEMAKER INSERTION N/A 01/04/2014  ? Procedure: PERMANENT PACEMAKER INSERTION;  Surgeon: Marinus Maw, MD;  Location: [facility]  CATH LAB;  Service: Cardiovascular;  Laterality: N/A;  ? RIGHT HEART CATHETERIZATION N/A 02/12/2014  ? Procedure: RIGHT HEART CATH;  Surgeon: Laurey Morale, MD;  Location: [facility] CATH LAB;  Service: Cardiovascular;  Laterality: N/A;  ? TEE WITHOUT CARDIOVERSION N/A 02/12/2014  ? Procedure: TRANSESOPHAGEAL ECHOCARDIOGRAM  (TEE);  Surgeon: Quintella Reichert, MD;  Location: [facility] ENDOSCOPY;  Service: Cardiovascular;  Laterality: N/A;  ? TEE WITHOUT CARDIOVERSION N/A 04/30/2014  ? Procedure: TRANSESOPHAGEAL ECHOCARDIOGRAM (TEE);  Surgeon: Jake Bathe, MD;  Location: [facility] ENDOSCOPY;  Service: Cardiovascular;  Laterality: N/A;  ? TRICUSPID VALVE REPLACEMENT N/A 12/30/2013  ? Procedure: REPAIR OF TRICUSPID VALVE LEAFLET;  Surgeon: Delight Ovens, MD;  Location: [facility] OR;  Service: Open Heart Surgery;  Laterality: N/A;  ? TRICUSPID VALVE REPLACEMENT N/A 02/12/2014  ? Procedure: TRICUSPID VALVE REPAIR, REDO MEDIAN STERNOTOMY, REPAIR OF VENTRICULAR SEPTAL DEFECT (RECURRENT LV OUTFLOW TRACT TO RIGHT ATRIAL FISTULA), REDO CLOSURE OF PATENT FORAMEN OVALE;  Surgeon: Purcell Nails, MD;  Location: [facility] OR;  Service: Open Heart Surgery;  Laterality: N/A;  ? VENOGRAM Left 04/06/2019  ? Procedure: Venogram;  Surgeon: Marinus Maw, MD;  Location: [facility] OR;  Service: Cardiovascular;  Laterality: Left;  ? ? ?Allergies ? ?Allergies  ?Allergen Reactions  ? Cefepime Other (See Comments)  ?  Leukopenia not clear if due to vancomycin vs cefepime. WBC dropped down  ? Vancomycin Other (See Comments)  ?  ? Drug induced leukopenia vs being due to cefepime. WBC dropped down  ? ? ?History of Present Illness  ?  ?36 year old male with the above past medical history including bacterial endocarditis s/p aortic root and valve replacement with fistula repair in 2015, VSD, tricuspid valve regurgitation s/p repair of VSD/tricuspid valve repair/closure of PFO in 2016, CHB s/p PPM, atypical chest pain, mild LV dysfunction, thrombocytopenia, and migraine headaches. ? ?He initially presented in 2015 with "flu" that ended up being bacterial endocarditis.  He underwent aortic root and aortic valve replacement with fistula repair in 2015.  At the time of surgery, he had complete heart block s/p PPM placement, follows with EP.  He had symptoms of dyspnea in January 2016.  TEE revealed a  large VSD with left-to-right shunting from LVOT.  On 02/12/2014 he underwent redo median sternotomy, repair of VSD, tricuspid valve repair, redo closure of PFO.  Repeat echocardiogram in March 2016 showed mild global reduction in LV function.  There was a mobile mass on the aortic valve concerning for vegetation as well as mild to moderate aortic insufficiency.  Patient was admitted and received IV antibiotics.  Blood cultures were negative.  TEE revealed low normal LV function and aortic valve mass.  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  He was evaluated by CT surgery and medical therapy was recommended.  Echo in February 2022 showed EF 45 to 50%, G2 DD, previous VSD repair with no residual flow, mild RV dysfunction, s/p aortic valve replacement with mean gradient 19 mmHg, mild aortic regurgitation, previous tricuspid valve repair with trace crepitus good regurgitation, mean gradient 5 mmHg.  He was last seen in the office on 11/03/2020 reported occasional chest discomfort that radiated to his shoulders and back, occurring with certain movements and with inspiration, lasting seconds at a time.  Symptoms were thought to be musculoskeletal in nature, ischemic evaluation was not recommended at that time.  ? ?He presents today for follow-up.  Since his last visit he has been stable from a cardiac standpoint.  He thinks that his previous episodes of chest pain were related to anxiety.  He denies any further chest pain, denies dyspnea, edema. He does note a longstanding history of intermittent dizziness with associated blurred vision.  He first noticed the symptoms following his heart surgery in 2015.  His episodes of dizziness occur randomly (at rest and with activity), and last for seconds at a time.  He denies palpitations, presyncope, syncope.  He states he had an MRI last year that was unrevealing.  Other than his ongoing dizziness, he denies any new concerns today. ? ?Home  Medications  ?  ?Current Outpatient Medications  ?Medication Sig Dispense Refill  ? acetaminophen (TYLENOL) 500 MG tablet Take 1,000 mg by mouth every 6 (six) hours as needed for moderate pain or headache.    ? amo

## 2021-06-07 ENCOUNTER — Ambulatory Visit: Payer: 59 | Admitting: Nurse Practitioner

## 2021-06-07 ENCOUNTER — Encounter: Payer: Self-pay | Admitting: Nurse Practitioner

## 2021-06-07 VITALS — BP 126/78 | HR 68 | Ht 65.0 in | Wt 241.6 lb

## 2021-06-07 DIAGNOSIS — Z8679 Personal history of other diseases of the circulatory system: Secondary | ICD-10-CM | POA: Diagnosis not present

## 2021-06-07 DIAGNOSIS — Z950 Presence of cardiac pacemaker: Secondary | ICD-10-CM

## 2021-06-07 DIAGNOSIS — R0789 Other chest pain: Secondary | ICD-10-CM

## 2021-06-07 DIAGNOSIS — Z954 Presence of other heart-valve replacement: Secondary | ICD-10-CM | POA: Diagnosis not present

## 2021-06-07 DIAGNOSIS — I442 Atrioventricular block, complete: Secondary | ICD-10-CM | POA: Diagnosis not present

## 2021-06-07 DIAGNOSIS — R42 Dizziness and giddiness: Secondary | ICD-10-CM

## 2021-06-07 DIAGNOSIS — I351 Nonrheumatic aortic (valve) insufficiency: Secondary | ICD-10-CM

## 2021-06-07 DIAGNOSIS — I428 Other cardiomyopathies: Secondary | ICD-10-CM

## 2021-06-07 DIAGNOSIS — I361 Nonrheumatic tricuspid (valve) insufficiency: Secondary | ICD-10-CM

## 2021-06-07 DIAGNOSIS — Z8774 Personal history of (corrected) congenital malformations of heart and circulatory system: Secondary | ICD-10-CM

## 2021-06-07 MED ORDER — METOPROLOL SUCCINATE ER 25 MG PO TB24: 25.0000 mg | ORAL_TABLET | Freq: Every day | ORAL | 3 refills | Status: DC

## 2021-06-07 NOTE — Patient Instructions (Signed)
Medication Instructions:  ?Your physician recommends that you continue on your current medications as directed. Please refer to the Current Medication list given to you today.  ? ?*If you need a refill on your cardiac medications before your next appointment, please call your pharmacy* ? ? ?Lab Work: ?NONE ordered at this time of appointment  ? ?If you have labs (blood work) drawn today and your tests are completely normal, you will receive your results only by: ?MyChart Message (if you have MyChart) OR ?A paper copy in the mail ?If you have any lab test that is abnormal or we need to change your treatment, we will call you to review the results. ? ? ?Testing/Procedures: ?Your physician has requested that you have an echocardiogram. Echocardiography is a painless test that uses sound waves to create images of your heart. It provides your doctor with information about the size and shape of your heart and how well your heart?s chambers and valves are working. This procedure takes approximately one hour. There are no restrictions for this procedure.  ? ?Your physician has requested that you have a carotid duplex. This test is an ultrasound of the carotid arteries in your neck. It looks at blood flow through these arteries that supply the brain with blood. Allow one hour for this exam. There are no restrictions or special instructions.  ? ? ?Follow-Up: ?At [facility], you and your health needs are our priority.  As part of our continuing mission to provide you with exceptional heart care, we have created designated Provider Care Teams.  These Care Teams include your primary Cardiologist (physician) and Advanced Practice Providers (APPs -  Physician Assistants and Nurse Practitioners) who all work together to provide you with the care you need, when you need it. ? ?We recommend signing up for the patient portal called "MyChart".  Sign up information is provided on this After Visit Summary.  MyChart is used to connect  with patients for Virtual Visits (Telemedicine).  Patients are able to view lab/test results, encounter notes, upcoming appointments, etc.  Non-urgent messages can be sent to your provider as well.   ?To learn more about what you can do with MyChart, go to [email or web address].   ? ?Your next appointment:   ?1 year(s) ? ?The format for your next appointment:   ?In Person ? ?Provider:   ?Kirk Ruths, MD   ? ? ?Other Instructions ? ? ?Important Information About Sugar ? ? ? ? ? ? ?

## 2021-06-11 ENCOUNTER — Encounter: Payer: Self-pay | Admitting: Cardiology

## 2021-06-12 ENCOUNTER — Emergency Department (HOSPITAL_COMMUNITY)
Admission: EM | Admit: 2021-06-12 | Discharge: 2021-06-12 | Disposition: A | Discharge status: Home / Self Care | Payer: No Typology Code available for payment source | Attending: Emergency Medicine | Admitting: Emergency Medicine

## 2021-06-12 ENCOUNTER — Encounter (HOSPITAL_COMMUNITY): Payer: Self-pay

## 2021-06-12 DIAGNOSIS — Z203 Contact with and (suspected) exposure to rabies: Secondary | ICD-10-CM | POA: Insufficient documentation

## 2021-06-12 DIAGNOSIS — Z2913 Encounter for prophylactic Rho(D) immune globulin: Secondary | ICD-10-CM | POA: Diagnosis not present

## 2021-06-12 DIAGNOSIS — S0195XA Open bite of unspecified part of head, initial encounter: Secondary | ICD-10-CM | POA: Insufficient documentation

## 2021-06-12 DIAGNOSIS — S1195XA Open bite of unspecified part of neck, initial encounter: Secondary | ICD-10-CM | POA: Diagnosis not present

## 2021-06-12 DIAGNOSIS — Z7982 Long term (current) use of aspirin: Secondary | ICD-10-CM | POA: Diagnosis not present

## 2021-06-12 DIAGNOSIS — Z23 Encounter for immunization: Secondary | ICD-10-CM | POA: Insufficient documentation

## 2021-06-12 DIAGNOSIS — Y990 Civilian activity done for income or pay: Secondary | ICD-10-CM | POA: Diagnosis not present

## 2021-06-12 DIAGNOSIS — S199XXA Unspecified injury of neck, initial encounter: Secondary | ICD-10-CM | POA: Diagnosis present

## 2021-06-12 DIAGNOSIS — W5551XA Bitten by raccoon, initial encounter: Secondary | ICD-10-CM | POA: Diagnosis not present

## 2021-06-12 MED ORDER — RABIES VACCINE, PCEC IM SUSR
1.0000 mL | Freq: Once | INTRAMUSCULAR | Status: AC
  Administered 2021-06-12: 1 mL via INTRAMUSCULAR
  Filled 2021-06-12: qty 1

## 2021-06-12 MED ORDER — RABIES IMMUNE GLOBULIN 150 UNIT/ML IM INJ
20.0000 [IU]/kg | INJECTION | Freq: Once | INTRAMUSCULAR | Status: AC
  Administered 2021-06-12: 2175 [IU] via INTRAMUSCULAR
  Filled 2021-06-12: qty 20

## 2021-06-12 NOTE — Discharge Instructions (Signed)
You received rabies vaccination and immunoglobulin today at day 0.  You will need repeat vaccinations on day 3, 7 and 14.  This is usually done with the health department. ?

## 2021-06-12 NOTE — ED Notes (Signed)
Pt states Animal Control was notified last night about situation.  ?

## 2021-06-12 NOTE — ED Provider Notes (Signed)
?[facility]-EMERGENCY DEPT ?Provider Note ? ? ?CSN: [id number] ?Arrival date & time: 06/12/21  4196 ? ?  ? ?History ? ?No chief complaint on file. ? ? ?[name] is a 36 y.o. male.  He is here for evaluation of possible rabies exposure.  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  Had bloody material splashed into his eye.  Racoon is being sent for autopsy.  Patient has no medical complaints.  He has not been vaccinated for rabies before. ? ?The history is provided by the patient.  ?Animal Bite ?Contact animal:  Raccoon ?Location:  Head/neck ?Head/neck injury location:  Head ?Time since incident:  1 hour ?Pain details:  ?  Severity:  No pain ?  Progression:  Unchanged ?Incident location:  Outside ?Notifications:  Animal control ?Animal's rabies vaccination status:  Never received ?Animal in possession: yes   ?Relieved by:  None tried ?Worsened by:  Nothing ?Ineffective treatments:  None tried ?Associated symptoms: no fever   ? ?  ? ?Home Medications ?Prior to Admission medications   ?Medication Sig Start Date End Date Taking? Authorizing Provider  ?acetaminophen (TYLENOL) 500 MG tablet Take 1,000 mg by mouth every 6 (six) hours as needed for moderate pain or headache.    [provider]  ?amoxicillin (AMOXIL) 500 MG tablet Take all 4 tablets 1 hour prior to dental work 03/29/20   [name], MD  ?amoxicillin-clavulanate (AUGMENTIN) 875-125 MG tablet amoxicillin 875 mg-potassium clavulanate 125 mg tablet ? TAKE ONE TABLET BY MOUTH EVERY 12 hours    [provider]  ?aspirin EC 81 MG tablet Take 81 mg by mouth daily.     [provider]  ?aspirin-acetaminophen-caffeine (EXCEDRIN MIGRAINE) [phone] MG tablet Take by mouth every 6 (six) hours as needed for headache.    [provider]  ?metoprolol succinate (TOPROL-XL) 25 MG 24 hr tablet Take 1 tablet (25 mg total) by mouth daily. Schedule an appointment for further refills,  1st attempt 06/07/21   Joylene Grapes, NP  ?   ? ?Allergies    ?Cefepime and Vancomycin   ? ?Review of Systems   ?Review of Systems  ?Constitutional:  Negative for fever.  ? ?Physical Exam ?Updated Vital Signs ?BP 109/68 (BP Location: Right Arm)   Pulse 62   Temp 97.9 ?F (36.6 ?C)   Resp 16   SpO2 100%  ?Physical Exam ?Vitals and nursing note reviewed.  ?Constitutional:   ?   Appearance: Normal appearance. He is well-developed.  ?HENT:  ?   Head: Normocephalic and atraumatic.  ?Eyes:  ?   Conjunctiva/sclera: Conjunctivae normal.  ?Pulmonary:  ?   Effort: Pulmonary effort is normal.  ?Musculoskeletal:  ?   Cervical back: Neck supple.  ?Skin: ?   General: Skin is warm and dry.  ?Neurological:  ?   General: No focal deficit present.  ?   Mental Status: He is alert.  ?   GCS: GCS eye subscore is 4. GCS verbal subscore is 5. GCS motor subscore is 6.  ? ? ?ED Results / Procedures / Treatments   ?Labs ?(all labs ordered are listed, but only abnormal results are displayed) ?Labs Reviewed - No data to display ? ?EKG ?None ? ?Radiology ?No results found. ? ?Procedures ?Procedures  ? ? ?Medications Ordered in ED ?Medications - No data to display ? ?ED Course/ Medical Decision Making/ A&P ?  ?                        ?  Medical Decision Making ?Risk ?Prescription drug management. ? ?36 year old male here with possible rabies exposure blood splashed into his eyes.  Will begin immunization process with immune globulin and vaccination.  Patient will follow-up with animal control.  Return instructions discussed. ? ? ? ? ? ? ? ? ?Final Clinical Impression(s) / ED Diagnoses ?Final diagnoses:  ?Contact with and (suspected) exposure to rabies  ? ? ?Rx / DC Orders ?ED Discharge Orders   ? ? None  ? ?  ? ? ?  ?Terrilee Files, MD ?06/12/21 1825 ? ?

## 2021-06-12 NOTE — ED Triage Notes (Signed)
Pt presents with c/o rabies exposure. Pt shot a racoon and had some of the animal matter splash back into his eyes. Pt reports the animal was rabid.  ?

## 2021-06-16 ENCOUNTER — Encounter: Payer: Self-pay | Admitting: Internal Medicine

## 2021-06-27 ENCOUNTER — Other Ambulatory Visit (HOSPITAL_BASED_OUTPATIENT_CLINIC_OR_DEPARTMENT_OTHER): Payer: 59

## 2021-06-27 ENCOUNTER — Encounter (HOSPITAL_COMMUNITY): Payer: 59

## 2021-07-03 ENCOUNTER — Ambulatory Visit (INDEPENDENT_AMBULATORY_CARE_PROVIDER_SITE_OTHER): Payer: 59

## 2021-07-03 DIAGNOSIS — I428 Other cardiomyopathies: Secondary | ICD-10-CM

## 2021-07-04 ENCOUNTER — Ambulatory Visit (HOSPITAL_BASED_OUTPATIENT_CLINIC_OR_DEPARTMENT_OTHER): Payer: 59

## 2021-07-04 ENCOUNTER — Ambulatory Visit (INDEPENDENT_AMBULATORY_CARE_PROVIDER_SITE_OTHER): Payer: 59

## 2021-07-04 DIAGNOSIS — I442 Atrioventricular block, complete: Secondary | ICD-10-CM

## 2021-07-04 DIAGNOSIS — Z950 Presence of cardiac pacemaker: Secondary | ICD-10-CM

## 2021-07-04 DIAGNOSIS — Z954 Presence of other heart-valve replacement: Secondary | ICD-10-CM | POA: Diagnosis not present

## 2021-07-04 DIAGNOSIS — R42 Dizziness and giddiness: Secondary | ICD-10-CM

## 2021-07-04 DIAGNOSIS — Z8774 Personal history of (corrected) congenital malformations of heart and circulatory system: Secondary | ICD-10-CM | POA: Diagnosis not present

## 2021-07-04 DIAGNOSIS — R0789 Other chest pain: Secondary | ICD-10-CM

## 2021-07-04 LAB — ECHOCARDIOGRAM COMPLETE
AR max vel: 1.37 cm2
AV Area VTI: 1.49 cm2
AV Area mean vel: 1.42 cm2
AV Mean grad: 24 mmHg
AV Peak grad: 42 mmHg
AV Vena cont: 0.59 cm
Ao pk vel: 3.24 m/s
Area-P 1/2: 3.89 cm2
S' Lateral: 3.46 cm

## 2021-07-04 MED ORDER — PERFLUTREN LIPID MICROSPHERE
1.0000 mL | INTRAVENOUS | Status: AC | PRN
  Administered 2021-07-04: 2 mL via INTRAVENOUS

## 2021-07-05 LAB — CUP PACEART REMOTE DEVICE CHECK
Battery Remaining Longevity: 122 mo
Battery Voltage: 3.02 V
Brady Statistic AP VP Percent: 7.9 %
Brady Statistic AP VS Percent: 0 %
Brady Statistic AS VP Percent: 92.09 %
Brady Statistic AS VS Percent: 0.01 %
Brady Statistic RA Percent Paced: 7.88 %
Brady Statistic RV Percent Paced: 99.99 %
Date Time Interrogation Session: 20230523101820
Implantable Lead Implant Date: 20151123
Implantable Lead Implant Date: 20210222
Implantable Lead Location: 753859
Implantable Lead Location: 753860
Implantable Lead Model: 5076
Implantable Lead Model: 5076
Implantable Pulse Generator Implant Date: 20210222
Lead Channel Impedance Value: 304 Ohm
Lead Channel Impedance Value: 342 Ohm
Lead Channel Impedance Value: 589 Ohm
Lead Channel Impedance Value: 779 Ohm
Lead Channel Pacing Threshold Amplitude: 0.625 V
Lead Channel Pacing Threshold Amplitude: 0.625 V
Lead Channel Pacing Threshold Pulse Width: 0.4 ms
Lead Channel Pacing Threshold Pulse Width: 0.4 ms
Lead Channel Sensing Intrinsic Amplitude: 1.125 mV
Lead Channel Sensing Intrinsic Amplitude: 1.125 mV
Lead Channel Sensing Intrinsic Amplitude: 7.875 mV
Lead Channel Sensing Intrinsic Amplitude: 7.875 mV
Lead Channel Setting Pacing Amplitude: 1.5 V
Lead Channel Setting Pacing Amplitude: 2.5 V
Lead Channel Setting Pacing Pulse Width: 0.4 ms
Lead Channel Setting Sensing Sensitivity: 5.6 mV

## 2021-07-06 ENCOUNTER — Telehealth: Payer: Self-pay

## 2021-07-06 NOTE — Telephone Encounter (Signed)
Called patient, gave ECHO results.  Patient verbalized understanding.  Thankful for call back-  Patient did have a few questions in regards to pacemaker, I did advise him to contact Dr.Taylors office as they would be the best to answer specific questions. He will call them.

## 2021-07-19 NOTE — Progress Notes (Signed)
Remote pacemaker transmission.   

## 2021-08-29 IMAGING — DX DG CHEST 1V PORT
1 series · 1 of 1 positions shown · non-contrast
Comparison: 04/30/2014

CLINICAL DATA: Dyspnea.  Prior aortic valve replacement.

EXAM:
PORTABLE CHEST 1 VIEW

[chest ap]
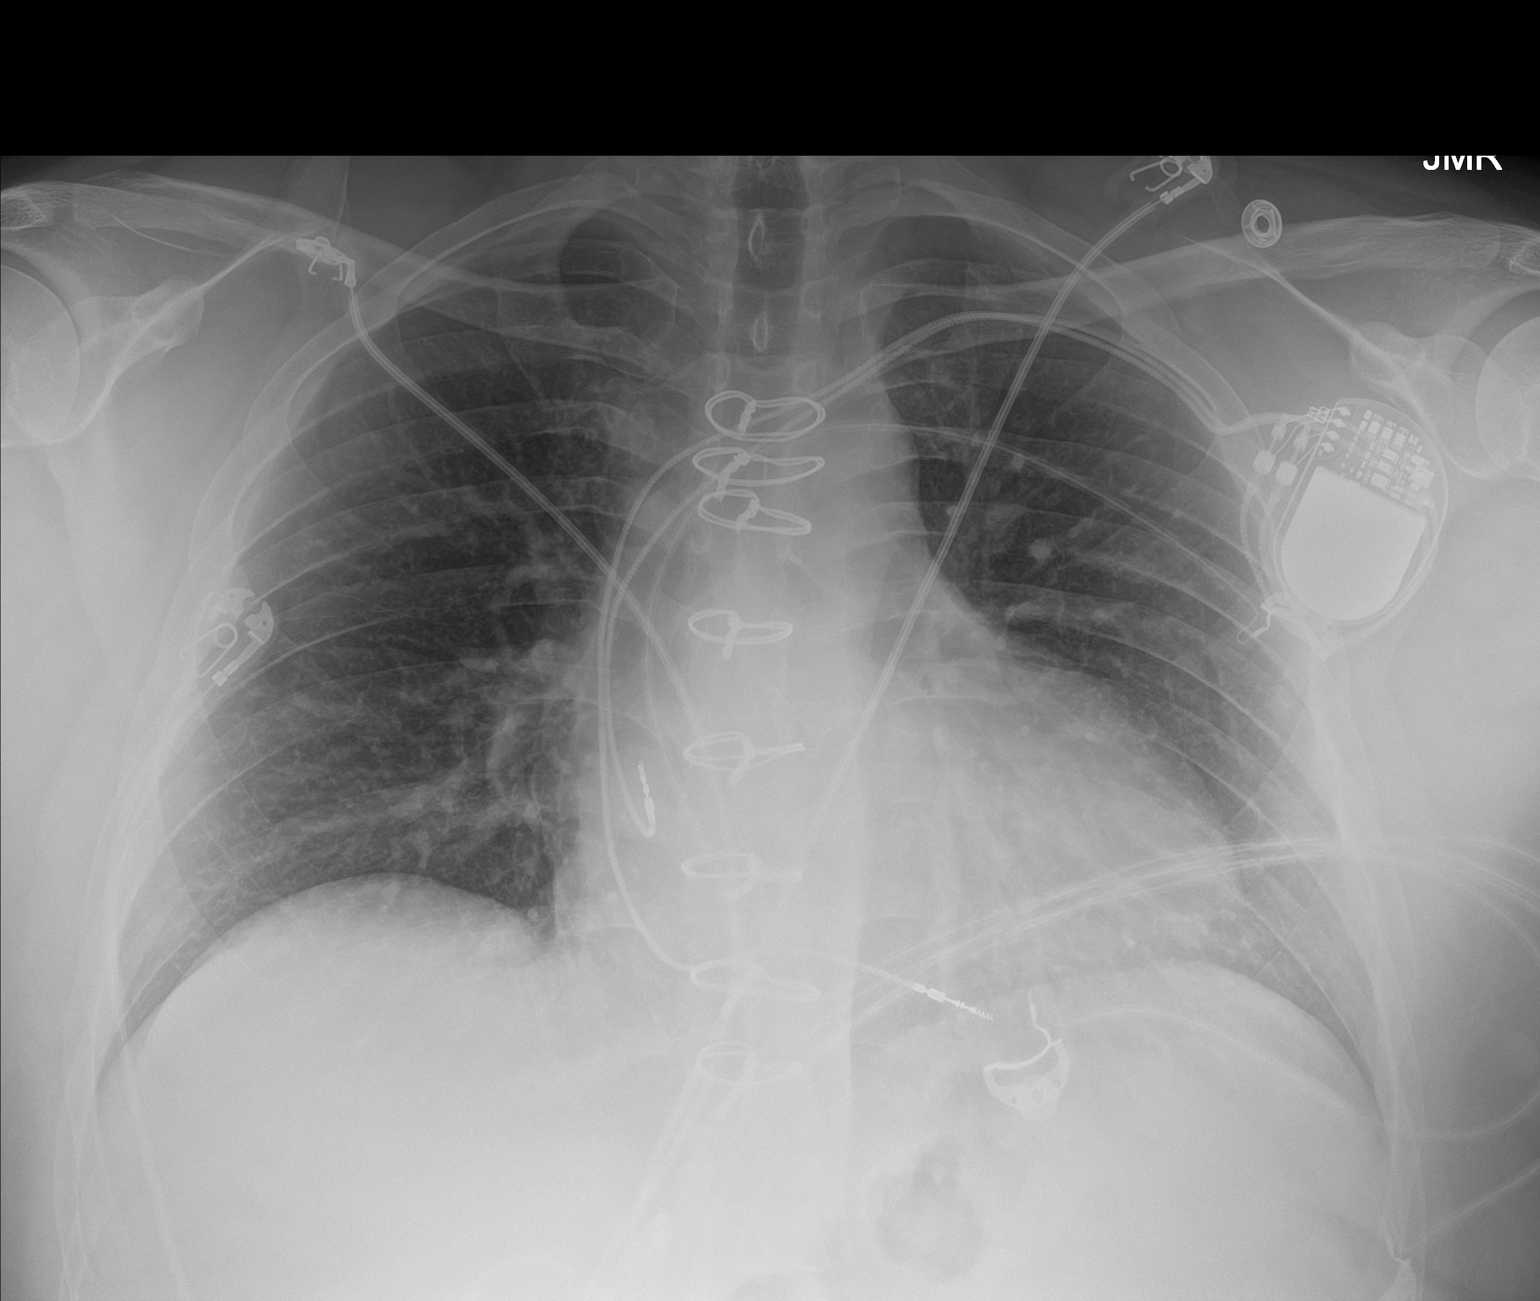

[1 of 1 positions shown; findings below may reference images not displayed]

FINDINGS: Sternotomy wires and left-sided pacemaker unchanged. Lungs are
adequately inflated without focal airspace consolidation or
effusion. Borderline stable cardiomegaly. Remainder of the exam is
unchanged.
IMPRESSION: No acute cardiopulmonary disease.

## 2021-10-02 ENCOUNTER — Ambulatory Visit (INDEPENDENT_AMBULATORY_CARE_PROVIDER_SITE_OTHER): Payer: 59

## 2021-10-02 DIAGNOSIS — R002 Palpitations: Secondary | ICD-10-CM

## 2021-10-03 LAB — CUP PACEART REMOTE DEVICE CHECK
Battery Remaining Longevity: 117 mo
Battery Voltage: 3.01 V
Brady Statistic AP VP Percent: 6.66 %
Brady Statistic AP VS Percent: 0 %
Brady Statistic AS VP Percent: 93.33 %
Brady Statistic AS VS Percent: 0.01 %
Brady Statistic RA Percent Paced: 6.64 %
Brady Statistic RV Percent Paced: 99.99 %
Date Time Interrogation Session: 20230820222010
Implantable Lead Implant Date: 20151123
Implantable Lead Implant Date: 20210222
Implantable Lead Location: 753859
Implantable Lead Location: 753860
Implantable Lead Model: 5076
Implantable Lead Model: 5076
Implantable Pulse Generator Implant Date: 20210222
Lead Channel Impedance Value: 323 Ohm
Lead Channel Impedance Value: 361 Ohm
Lead Channel Impedance Value: 608 Ohm
Lead Channel Impedance Value: 722 Ohm
Lead Channel Pacing Threshold Amplitude: 0.5 V
Lead Channel Pacing Threshold Amplitude: 0.625 V
Lead Channel Pacing Threshold Pulse Width: 0.4 ms
Lead Channel Pacing Threshold Pulse Width: 0.4 ms
Lead Channel Sensing Intrinsic Amplitude: 1.125 mV
Lead Channel Sensing Intrinsic Amplitude: 1.125 mV
Lead Channel Sensing Intrinsic Amplitude: 7.875 mV
Lead Channel Sensing Intrinsic Amplitude: 7.875 mV
Lead Channel Setting Pacing Amplitude: 1.5 V
Lead Channel Setting Pacing Amplitude: 2.5 V
Lead Channel Setting Pacing Pulse Width: 0.4 ms
Lead Channel Setting Sensing Sensitivity: 5.6 mV

## 2021-10-28 NOTE — Progress Notes (Signed)
Remote pacemaker transmission.   

## 2022-01-01 ENCOUNTER — Ambulatory Visit (INDEPENDENT_AMBULATORY_CARE_PROVIDER_SITE_OTHER): Payer: 59

## 2022-01-01 DIAGNOSIS — I442 Atrioventricular block, complete: Secondary | ICD-10-CM | POA: Diagnosis not present

## 2022-01-01 LAB — CUP PACEART REMOTE DEVICE CHECK
Battery Remaining Longevity: 112 mo
Battery Voltage: 3.01 V
Brady Statistic AP VP Percent: 7.38 %
Brady Statistic AP VS Percent: 0 %
Brady Statistic AS VP Percent: 92.61 %
Brady Statistic AS VS Percent: 0.01 %
Brady Statistic RA Percent Paced: 7.36 %
Brady Statistic RV Percent Paced: 99.99 %
Date Time Interrogation Session: 20231119215435
Implantable Lead Connection Status: 753985
Implantable Lead Connection Status: 753985
Implantable Lead Implant Date: 20151123
Implantable Lead Implant Date: 20210222
Implantable Lead Location: 753859
Implantable Lead Location: 753860
Implantable Lead Model: 5076
Implantable Lead Model: 5076
Implantable Pulse Generator Implant Date: 20210222
Lead Channel Impedance Value: 342 Ohm
Lead Channel Impedance Value: 380 Ohm
Lead Channel Impedance Value: 570 Ohm
Lead Channel Impedance Value: 665 Ohm
Lead Channel Pacing Threshold Amplitude: 0.5 V
Lead Channel Pacing Threshold Amplitude: 0.5 V
Lead Channel Pacing Threshold Pulse Width: 0.4 ms
Lead Channel Pacing Threshold Pulse Width: 0.4 ms
Lead Channel Sensing Intrinsic Amplitude: 1.5 mV
Lead Channel Sensing Intrinsic Amplitude: 1.5 mV
Lead Channel Sensing Intrinsic Amplitude: 7.875 mV
Lead Channel Sensing Intrinsic Amplitude: 7.875 mV
Lead Channel Setting Pacing Amplitude: 1.5 V
Lead Channel Setting Pacing Amplitude: 2.5 V
Lead Channel Setting Pacing Pulse Width: 0.4 ms
Lead Channel Setting Sensing Sensitivity: 5.6 mV
Zone Setting Status: 755011
Zone Setting Status: 755011

## 2022-02-13 NOTE — Progress Notes (Signed)
Remote pacemaker transmission.   

## 2022-04-02 ENCOUNTER — Ambulatory Visit: Payer: 59

## 2022-04-02 DIAGNOSIS — I442 Atrioventricular block, complete: Secondary | ICD-10-CM | POA: Diagnosis not present

## 2022-04-02 LAB — CUP PACEART REMOTE DEVICE CHECK
Battery Remaining Longevity: 111 mo
Battery Voltage: 3.01 V
Brady Statistic AP VP Percent: 10.6 %
Brady Statistic AP VS Percent: 0 %
Brady Statistic AS VP Percent: 89.39 %
Brady Statistic AS VS Percent: 0.01 %
Brady Statistic RA Percent Paced: 10.58 %
Brady Statistic RV Percent Paced: 99.99 %
Date Time Interrogation Session: 20240218192325
Implantable Lead Connection Status: 753985
Implantable Lead Connection Status: 753985
Implantable Lead Implant Date: 20151123
Implantable Lead Implant Date: 20210222
Implantable Lead Location: 753859
Implantable Lead Location: 753860
Implantable Lead Model: 5076
Implantable Lead Model: 5076
Implantable Pulse Generator Implant Date: 20210222
Lead Channel Impedance Value: 323 Ohm
Lead Channel Impedance Value: 361 Ohm
Lead Channel Impedance Value: 608 Ohm
Lead Channel Impedance Value: 722 Ohm
Lead Channel Pacing Threshold Amplitude: 0.5 V
Lead Channel Pacing Threshold Amplitude: 0.5 V
Lead Channel Pacing Threshold Pulse Width: 0.4 ms
Lead Channel Pacing Threshold Pulse Width: 0.4 ms
Lead Channel Sensing Intrinsic Amplitude: 1.25 mV
Lead Channel Sensing Intrinsic Amplitude: 1.25 mV
Lead Channel Sensing Intrinsic Amplitude: 7.875 mV
Lead Channel Sensing Intrinsic Amplitude: 7.875 mV
Lead Channel Setting Pacing Amplitude: 1.5 V
Lead Channel Setting Pacing Amplitude: 2.5 V
Lead Channel Setting Pacing Pulse Width: 0.4 ms
Lead Channel Setting Sensing Sensitivity: 5.6 mV
Zone Setting Status: 755011
Zone Setting Status: 755011

## 2022-04-27 IMAGING — RF DG SPINAL PUNCT LUMBAR DIAG WITH FL CT GUIDANCE
1 series · 1 of 1 positions shown · non-contrast
Comparison: none

CLINICAL DATA: History of meningitis.

[Series 1: cp_standard · 0.18mm/px · 1 of 1 slices shown]
[im 1/1]
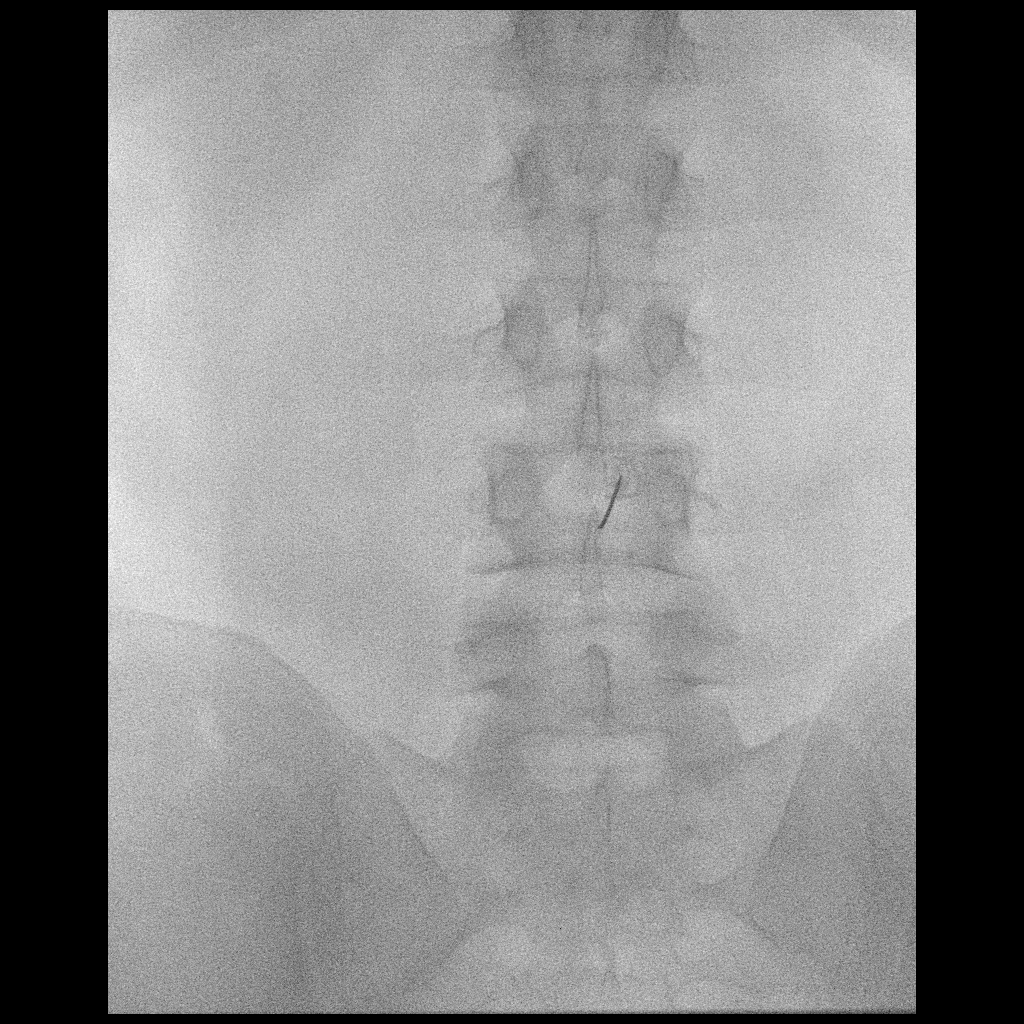

[1 of 1 positions shown; findings below may reference images not displayed]

EXAM:
DIAGNOSTIC LUMBAR PUNCTURE UNDER FLUOROSCOPIC GUIDANCE

FLUOROSCOPY TIME:  Fluoroscopy Time:  0 minutes 12 second

Radiation Exposure Index (if provided by the fluoroscopic device):

Number of Acquired Spot Images: 1

PROCEDURE:
Informed consent was obtained from the patient prior to the
procedure, including potential complications of headache, allergy,
and pain. With the patient prone, the lower back was prepped with
Betadine. 1% Lidocaine was used for local anesthesia. Lumbar
puncture was performed at the L3-4 level using a 20 gauge needle
with return of clear CSF with an opening pressure of 25 cm water. 9
ml of CSF were obtained for laboratory studies. The patient
tolerated the procedure well and there were no apparent
complications.
IMPRESSION: Successful lumbar puncture using fluoroscopy

## 2022-05-14 NOTE — Progress Notes (Signed)
Remote pacemaker transmission.   

## 2022-06-11 NOTE — Progress Notes (Unsigned)
HPI: FU AVR; initially seen in Nov 2015 with "flu" that ended up being bacterial endocarditis. He underwent aortic root and valve replacement with fistula repair. At that time he also had CHB and had a MDT PTVDP placed. Jan 2016 he had symptoms of dyspnea and was reevaluated. A TEE was done and and revealed a large VSD with left to right shunting from LVOT. On 02/12/2014  he underwent Redo Median Sternotomy, Repair of a VSD, Tricuspid Valve Repair, and Redo closure of PFO. Patient had a repeat echocardiogram on 04/29/2014 that showed mild global reduction in LV function. There was a mobile mass on the aortic valve concerning for vegetation as well as mild to moderate aortic insufficiency. The patient was admitted and IV antibiotics resumed. Blood cultures remained negative. Transesophageal echocardiogram revealed low normal LV function and an aortic valve mass. 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 Patient was evaluated by Dr. Tyrone Sage and medical therapy recommended. Echocardiogram February 2022 showed ejection fraction 45 to 50%, grade 2 diastolic dysfunction, previous VSD repair with no residual flow, mild RV dysfunction, status post aortic valve replacement with mean gradient 19 mmHg, mild aortic insufficiencies, previous tricuspid valve repair with trace tricuspid regurgitation and mean gradient 5 mmHg.  CTA April 2022 showed no pulmonary embolus.  Echo 5/23 showed normal LV function, mild biatrial enlargement, s/p TV repair, s/p AVR with moderate AS (mean gradient 24 mmHg) and mild AI. Carotid dopplers 5/23 showed < 50 bilaterally. Since last seen,   Current Outpatient Medications  Medication Sig Dispense Refill   acetaminophen (TYLENOL) 500 MG tablet Take 1,000 mg by mouth every 6 (six) hours as needed for moderate pain or headache.     amoxicillin (AMOXIL) 500 MG tablet Take all 4 tablets 1 hour prior to dental work 4 tablet 6   amoxicillin-clavulanate  (AUGMENTIN) 875-125 MG tablet amoxicillin 875 mg-potassium clavulanate 125 mg tablet  TAKE ONE TABLET BY MOUTH EVERY 12 hours     aspirin EC 81 MG tablet Take 81 mg by mouth daily.      aspirin-acetaminophen-caffeine (EXCEDRIN MIGRAINE) 250-250-65 MG tablet Take by mouth every 6 (six) hours as needed for headache.     metoprolol succinate (TOPROL-XL) 25 MG 24 hr tablet Take 1 tablet (25 mg total) by mouth daily. Schedule an appointment for further refills, 1st attempt 90 tablet 3   No current facility-administered medications for this visit.     Past Medical History:  Diagnosis Date   AKI (acute kidney injury) (HCC)    Aortic valve vegetation on echo 04/29/14 04/29/2014   managed medically   Brain abscess    Chest pain 03/02/2020   Congestive dilated cardiomyopathy (HCC) 06/23/2015   Drug-induced leukopenia (HCC) 06/15/2014   Endocarditis of aortic valve-November 2015    Fracture of left femur (HCC)    History of open heart surgery    December 30, 2013- ascending aortic root replacement, TEE   Lightheaded 03/02/2020   Meningitis    Group B Strep (November 2015)   Migraine headache 10/15/2019   since 2008   Obesity    Patent foramen ovale    Closed during procedure on December 30, 2013   Presence of permanent cardiac pacemaker 01/04/2014   PPM MEDTRONIC FOR COMPLETE HEART BLOCK   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   S/P aortic root and valve allograft 12/30/2013   Human allograft aortic root replacement with repair of aorta-right atrial fistula and tricuspid valve repair   S/P  placement of cardiac pacemaker 01/04/2014   [employer]tr[employer] (serial number [id number]) dual chamber permanent pacemaker implanted by Dr Ladona Ridgel   S/P redo patent foramen ovale closure 02/12/2014   S/P redo tricuspid valve repair 02/12/2014   Complex valvuloplasty including patch closure of VSD with plication of septal leaflet and 26 mm Edwards mc3 ring annuloplasty   S/P ventricular septal defect repair + redo  tricuspid valve repair + redo closure PFO 02/12/2014   Redo sternotomy for bovine pericardial patch repair of large ventricular septal defect (recurrent LVOT to RA fistula due to bacterial endocarditis)   Smokeless tobacco use    Third degree heart block (HCC) 12/31/2013   Thrombocytopenia (HCC) 12/2013   Tricuspid regurgitation 02/11/2014   Ventricular septal defect 02/12/2014   Post-infectious s/p repair of LVOT to RA fistula for bacterial endocarditis    Past Surgical History:  Procedure Laterality Date   ASCENDING AORTIC ROOT REPLACEMENT N/A 12/30/2013   Procedure: ASCENDING AORTIC ROOT REPLACEMENT with 23mm HOMOGRAFT, CLOSURE OF AORTIC TO RIGHT ATRIAL FISTULA, CLOSURE OF PATENT FORAMEN OVALE;  Surgeon: Delight Ovens, MD;  Location: [facility] OR;  Service: Open Heart Surgery;  Laterality: N/A;   INSERT / REPLACE / REMOVE PACEMAKER  01/04/2014   INTRAOPERATIVE TRANSESOPHAGEAL ECHOCARDIOGRAM N/A 12/30/2013   Procedure: INTRAOPERATIVE TRANSESOPHAGEAL ECHOCARDIOGRAM;  Surgeon: Delight Ovens, MD;  Location: [facility] OR;  Service: Open Heart Surgery;  Laterality: N/A;   PACEMAKER INSERTION Left 04/06/2019   Procedure: PACEMAKER LEAD INSERTION;  Surgeon: Marinus Maw, MD;  Location: [facility] OR;  Service: Cardiovascular;  Laterality: Left;   PACEMAKER LEAD REMOVAL N/A 04/06/2019   Procedure: PACEMAKER LEAD REMOVAL;  Surgeon: Marinus Maw, MD;  Location: [facility] OR;  Service: Cardiovascular;  Laterality: N/A;   PERMANENT PACEMAKER INSERTION N/A 01/04/2014   Procedure: PERMANENT PACEMAKER INSERTION;  Surgeon: Marinus Maw, MD;  Location: [facility] CATH LAB;  Service: Cardiovascular;  Laterality: N/A;   RIGHT HEART CATHETERIZATION N/A 02/12/2014   Procedure: RIGHT HEART CATH;  Surgeon: Laurey Morale, MD;  Location: [facility] CATH LAB;  Service: Cardiovascular;  Laterality: N/A;   TEE WITHOUT CARDIOVERSION N/A 02/12/2014   Procedure: TRANSESOPHAGEAL ECHOCARDIOGRAM (TEE);  Surgeon: Quintella Reichert, MD;  Location: [facility] ENDOSCOPY;   Service: Cardiovascular;  Laterality: N/A;   TEE WITHOUT CARDIOVERSION N/A 04/30/2014   Procedure: TRANSESOPHAGEAL ECHOCARDIOGRAM (TEE);  Surgeon: Jake Bathe, MD;  Location: [facility] ENDOSCOPY;  Service: Cardiovascular;  Laterality: N/A;   TRICUSPID VALVE REPLACEMENT N/A 12/30/2013   Procedure: REPAIR OF TRICUSPID VALVE LEAFLET;  Surgeon: Delight Ovens, MD;  Location: [facility] OR;  Service: Open Heart Surgery;  Laterality: N/A;   TRICUSPID VALVE REPLACEMENT N/A 02/12/2014   Procedure: TRICUSPID VALVE REPAIR, REDO MEDIAN STERNOTOMY, REPAIR OF VENTRICULAR SEPTAL DEFECT (RECURRENT LV OUTFLOW TRACT TO RIGHT ATRIAL FISTULA), REDO CLOSURE OF PATENT FORAMEN OVALE;  Surgeon: Purcell Nails, MD;  Location: [facility] OR;  Service: Open Heart Surgery;  Laterality: N/A;   VENOGRAM Left 04/06/2019   Procedure: Venogram;  Surgeon: Marinus Maw, MD;  Location: [facility] OR;  Service: Cardiovascular;  Laterality: Left;    Social History   Socioeconomic History   Marital status: Married    Spouse name: Sirena   Number of children: 2   Years of education: 12   Highest education level: Some college, no degree  Occupational History   Not on file  Tobacco Use   Smoking status: Never   Smokeless tobacco: Former    Types: Snuff    Quit date: 12/30/2013  Substance and Sexual Activity   Alcohol use: Yes    Alcohol/week: 0.0 standard drinks of alcohol    Comment: socially   Drug use: No   Sexual activity: Yes    Birth control/protection: Condom  Other Topics Concern   Not on file  Social History Narrative   Lives with wife, children   Caffeine- coffee, 1 c, 2-3 cans soda a day   Social Determinants of Health   Financial Resource Strain: Not on file  Food Insecurity: Not on file  Transportation Needs: Not on file  Physical Activity: Not on file  Stress: Not on file  Social Connections: Not on file  Intimate Partner Violence: Not on file    Family History  Problem Relation Age of Onset   Migraines Mother     COPD Father    Migraines Brother    Heart murmur Brother    Migraines Brother    Migraines Maternal Grandmother    Asthma Neg Hx     ROS: no fevers or chills, productive cough, hemoptysis, dysphasia, odynophagia, melena, hematochezia, dysuria, hematuria, rash, seizure activity, orthopnea, PND, pedal edema, claudication. Remaining systems are negative.  Physical Exam: Well-developed well-nourished in no acute distress.  Skin is warm and dry.  HEENT is normal.  Neck is supple.  Chest is clear to auscultation with normal expansion.  Cardiovascular exam is regular rate and rhythm.  Abdominal exam nontender or distended. No masses palpated. Extremities show no edema. neuro grossly intact  ECG- personally reviewed  A/P  1 chest pain-   2 history of aortic valve replacement/aortic root replacement-continue SBE prophylaxis. Most recent echo showed elevated gradient across aortic valve and mild AI; will repeat.    3 previous VSD repair/PFO closure as well as tricuspid valve repair-continue SBE prophylaxis.   4 history of mild LV dysfunction-LV function improved on most recent echo; continue beta-blocker.   5 pacemaker-Per electrophysiology.  Olga Millers, MD

## 2022-06-12 ENCOUNTER — Ambulatory Visit: Payer: 59 | Attending: Cardiology | Admitting: Cardiology

## 2022-06-12 ENCOUNTER — Encounter: Payer: Self-pay | Admitting: Cardiology

## 2022-06-12 VITALS — BP 118/82 | HR 70 | Ht 66.0 in | Wt 234.8 lb

## 2022-06-12 DIAGNOSIS — Z8774 Personal history of (corrected) congenital malformations of heart and circulatory system: Secondary | ICD-10-CM | POA: Diagnosis not present

## 2022-06-12 DIAGNOSIS — Z954 Presence of other heart-valve replacement: Secondary | ICD-10-CM | POA: Diagnosis not present

## 2022-06-12 DIAGNOSIS — I428 Other cardiomyopathies: Secondary | ICD-10-CM

## 2022-06-12 DIAGNOSIS — R0789 Other chest pain: Secondary | ICD-10-CM

## 2022-06-12 DIAGNOSIS — Z950 Presence of cardiac pacemaker: Secondary | ICD-10-CM

## 2022-06-12 NOTE — Patient Instructions (Signed)
  Testing/Procedures:  Your physician has requested that you have an echocardiogram. Echocardiography is a painless test that uses sound waves to create images of your heart. It provides your doctor with information about the size and shape of your heart and how well your heart's chambers and valves are working. This procedure takes approximately one hour. There are no restrictions for this procedure. Please do NOT wear cologne, perfume, aftershave, or lotions (deodorant is allowed). Please arrive 15 minutes prior to your appointment time. [address]   Follow-Up: At [facility], you and your health needs are our priority.  As part of our continuing mission to provide you with exceptional heart care, we have created designated Provider Care Teams.  These Care Teams include your primary Cardiologist (physician) and Advanced Practice Providers (APPs -  Physician Assistants and Nurse Practitioners) who all work together to provide you with the care you need, when you need it.  We recommend signing up for the patient portal called "MyChart".  Sign up information is provided on this After Visit Summary.  MyChart is used to connect with patients for Virtual Visits (Telemedicine).  Patients are able to view lab/test results, encounter notes, upcoming appointments, etc.  Non-urgent messages can be sent to your provider as well.   To learn more about what you can do with MyChart, go to [email or web address].    Your next appointment:   12 month(s)  Provider:   Brian Crenshaw, MD      

## 2022-06-15 ENCOUNTER — Other Ambulatory Visit: Payer: Self-pay | Admitting: Nurse Practitioner

## 2022-07-02 ENCOUNTER — Ambulatory Visit (INDEPENDENT_AMBULATORY_CARE_PROVIDER_SITE_OTHER): Payer: 59

## 2022-07-02 DIAGNOSIS — I428 Other cardiomyopathies: Secondary | ICD-10-CM | POA: Diagnosis not present

## 2022-07-02 LAB — CUP PACEART REMOTE DEVICE CHECK
Battery Remaining Longevity: 108 mo
Battery Voltage: 3 V
Brady Statistic AP VP Percent: 13.91 %
Brady Statistic AP VS Percent: 0 %
Brady Statistic AS VP Percent: 86.08 %
Brady Statistic AS VS Percent: 0.01 %
Brady Statistic RA Percent Paced: 13.89 %
Brady Statistic RV Percent Paced: 99.99 %
Date Time Interrogation Session: 20240519191619
Implantable Lead Connection Status: 753985
Implantable Lead Connection Status: 753985
Implantable Lead Implant Date: 20151123
Implantable Lead Implant Date: 20210222
Implantable Lead Location: 753859
Implantable Lead Location: 753860
Implantable Lead Model: 5076
Implantable Lead Model: 5076
Implantable Pulse Generator Implant Date: 20210222
Lead Channel Impedance Value: 361 Ohm
Lead Channel Impedance Value: 380 Ohm
Lead Channel Impedance Value: 627 Ohm
Lead Channel Impedance Value: 722 Ohm
Lead Channel Pacing Threshold Amplitude: 0.5 V
Lead Channel Pacing Threshold Amplitude: 0.5 V
Lead Channel Pacing Threshold Pulse Width: 0.4 ms
Lead Channel Pacing Threshold Pulse Width: 0.4 ms
Lead Channel Sensing Intrinsic Amplitude: 1.625 mV
Lead Channel Sensing Intrinsic Amplitude: 1.625 mV
Lead Channel Sensing Intrinsic Amplitude: 7.875 mV
Lead Channel Sensing Intrinsic Amplitude: 7.875 mV
Lead Channel Setting Pacing Amplitude: 1.5 V
Lead Channel Setting Pacing Amplitude: 2.5 V
Lead Channel Setting Pacing Pulse Width: 0.4 ms
Lead Channel Setting Sensing Sensitivity: 5.6 mV
Zone Setting Status: 755011
Zone Setting Status: 755011

## 2022-07-03 NOTE — Progress Notes (Signed)
Cardiology Office Note Date:  07/03/2022  Patient ID:  [name], DOB 02/17/1985, MRN [id number] PCP:  [name], MD  Cardiologist:  Dr. Jens Som Electrophysiologist: Dr. Ladona Ridgel     Chief Complaint: over due device visit  History of Present Illness: [name] is a 37 y.o. male with history of  2015 : bacterial endocarditis > aortic root and valve replacement with fistula repair  Post/interop CHB >> PPM 2016 with progressive SOB TEE was done and and revealed a large VSD with left to right shunting from LVOT.  02/12/2014  he underwent Redo Median Sternotomy, Repair of a VSD, Tricuspid Valve Repair, and Redo closure of PFO.  March 2016 TTE showed mild global reduction in LV function. There was a mobile mass on the aortic valve concerning for vegetation as well as mild to moderate aortic insufficiency. The patient was admitted and IV antibiotics resumed. Blood cultures remained negative. Transesophageal echocardiogram revealed low normal LV function and an aortic valve mass. 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 Patient was evaluated by Dr. Tyrone Sage and medical therapy recommended.   He saw Dr. Ladona Ridgel 03/01/20 via telemedicine visit, reported DOE with exercise, planned for echo, PFTs, pacer functioning normal by remotes.  He has seen Dr. Ala Bent regularly since then, last saw Dr. Jens Som 06/12/22, his most recent echo 5/23 showed normal LV function, mild biatrial enlargement, s/p TV repair, s/p AVR with moderate AS (mean gradient 24 mmHg) and mild AI. Carotid dopplers 5/23 showed < 50 bilaterally.  He was feeling well, no SOB, CP, planned for an echo  Echo is pending  TODAY He is doing well Remains with a somewhat chronic though intermittent lightheaded feeling, not new, no near syncope or syncope. Nothing like how he felt when his lead failed. Rare fleeting sharp pain near the border of his device, none otherwise. No SOB He would like  to exercise, but worries about lead fracture/device complications, sometimes this worry also keeps him from sleeping well, that he may do something to his device.   Device information MDT dual chamber PPM implanted 01/04/2014 >  RV lead failure >> extracted w/gen change new RV lead 04/06/2019   Past Medical History:  Diagnosis Date   AKI (acute kidney injury) (HCC)    Aortic valve vegetation on echo 04/29/14 04/29/2014   managed medically   Brain abscess    Chest pain 03/02/2020   Congestive dilated cardiomyopathy (HCC) 06/23/2015   Drug-induced leukopenia (HCC) 06/15/2014   Endocarditis of aortic valve-November 2015    Fracture of left femur (HCC)    History of open heart surgery    December 30, 2013- ascending aortic root replacement, TEE   Lightheaded 03/02/2020   Meningitis    Group B Strep (November 2015)   Migraine headache 10/15/2019   since 2008   Obesity    Patent foramen ovale    Closed during procedure on December 30, 2013   Presence of permanent cardiac pacemaker 01/04/2014   PPM MEDTRONIC FOR COMPLETE HEART BLOCK   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   S/P aortic root and valve allograft 12/30/2013   Human allograft aortic root replacement with repair of aorta-right atrial fistula and tricuspid valve repair   S/P placement of cardiac pacemaker 01/04/2014   Medtronic (serial number [id number]) dual chamber permanent pacemaker implanted by Dr Ladona Ridgel   S/P redo patent foramen ovale closure 02/12/2014   S/P redo tricuspid valve repair 02/12/2014   Complex valvuloplasty including patch closure of  VSD with plication of septal leaflet and 26 mm Edwards mc3 ring annuloplasty   S/P ventricular septal defect repair + redo tricuspid valve repair + redo closure PFO 02/12/2014   Redo sternotomy for bovine pericardial patch repair of large ventricular septal defect (recurrent LVOT to RA fistula due to bacterial endocarditis)   Smokeless tobacco use    Third degree heart block  (HCC) 12/31/2013   Thrombocytopenia (HCC) 12/2013   Tricuspid regurgitation 02/11/2014   Ventricular septal defect 02/12/2014   Post-infectious s/p repair of LVOT to RA fistula for bacterial endocarditis    Past Surgical History:  Procedure Laterality Date   ASCENDING AORTIC ROOT REPLACEMENT N/A 12/30/2013   Procedure: ASCENDING AORTIC ROOT REPLACEMENT with 23mm HOMOGRAFT, CLOSURE OF AORTIC TO RIGHT ATRIAL FISTULA, CLOSURE OF PATENT FORAMEN OVALE;  Surgeon: Delight Ovens, MD;  Location: [facility] OR;  Service: Open Heart Surgery;  Laterality: N/A;   INSERT / REPLACE / REMOVE PACEMAKER  01/04/2014   INTRAOPERATIVE TRANSESOPHAGEAL ECHOCARDIOGRAM N/A 12/30/2013   Procedure: INTRAOPERATIVE TRANSESOPHAGEAL ECHOCARDIOGRAM;  Surgeon: Delight Ovens, MD;  Location: [facility] OR;  Service: Open Heart Surgery;  Laterality: N/A;   PACEMAKER INSERTION Left 04/06/2019   Procedure: PACEMAKER LEAD INSERTION;  Surgeon: Marinus Maw, MD;  Location: [facility] OR;  Service: Cardiovascular;  Laterality: Left;   PACEMAKER LEAD REMOVAL N/A 04/06/2019   Procedure: PACEMAKER LEAD REMOVAL;  Surgeon: Marinus Maw, MD;  Location: [facility] OR;  Service: Cardiovascular;  Laterality: N/A;   PERMANENT PACEMAKER INSERTION N/A 01/04/2014   Procedure: PERMANENT PACEMAKER INSERTION;  Surgeon: Marinus Maw, MD;  Location: [facility] CATH LAB;  Service: Cardiovascular;  Laterality: N/A;   RIGHT HEART CATHETERIZATION N/A 02/12/2014   Procedure: RIGHT HEART CATH;  Surgeon: Laurey Morale, MD;  Location: [facility] CATH LAB;  Service: Cardiovascular;  Laterality: N/A;   TEE WITHOUT CARDIOVERSION N/A 02/12/2014   Procedure: TRANSESOPHAGEAL ECHOCARDIOGRAM (TEE);  Surgeon: Quintella Reichert, MD;  Location: [facility] ENDOSCOPY;  Service: Cardiovascular;  Laterality: N/A;   TEE WITHOUT CARDIOVERSION N/A 04/30/2014   Procedure: TRANSESOPHAGEAL ECHOCARDIOGRAM (TEE);  Surgeon: Jake Bathe, MD;  Location: [facility] ENDOSCOPY;  Service: Cardiovascular;  Laterality: N/A;   TRICUSPID VALVE  REPLACEMENT N/A 12/30/2013   Procedure: REPAIR OF TRICUSPID VALVE LEAFLET;  Surgeon: Delight Ovens, MD;  Location: [facility] OR;  Service: Open Heart Surgery;  Laterality: N/A;   TRICUSPID VALVE REPLACEMENT N/A 02/12/2014   Procedure: TRICUSPID VALVE REPAIR, REDO MEDIAN STERNOTOMY, REPAIR OF VENTRICULAR SEPTAL DEFECT (RECURRENT LV OUTFLOW TRACT TO RIGHT ATRIAL FISTULA), REDO CLOSURE OF PATENT FORAMEN OVALE;  Surgeon: Purcell Nails, MD;  Location: [facility] OR;  Service: Open Heart Surgery;  Laterality: N/A;   VENOGRAM Left 04/06/2019   Procedure: Venogram;  Surgeon: Marinus Maw, MD;  Location: [facility] OR;  Service: Cardiovascular;  Laterality: Left;    Current Outpatient Medications  Medication Sig Dispense Refill   acetaminophen (TYLENOL) 500 MG tablet Take 1,000 mg by mouth every 6 (six) hours as needed for moderate pain or headache.     aspirin-acetaminophen-caffeine (EXCEDRIN MIGRAINE) 250-250-65 MG tablet Take by mouth every 6 (six) hours as needed for headache.     metoprolol succinate (TOPROL-XL) 25 MG 24 hr tablet TAKE ONE TABLET BY MOUTH DAILY 90 tablet 3   No current facility-administered medications for this visit.    Allergies:   Cefepime and Vancomycin   Social History:  The patient  reports that he has never smoked. He quit smokeless tobacco use about 8 years ago.  His smokeless tobacco use included snuff. He reports current alcohol use. He reports that he does not use drugs.   Family History:  The patient's family history includes COPD in his father; Heart murmur in his brother; Migraines in his brother, brother, maternal grandmother, and mother.  ROS:  Please see the history of present illness.    All other systems are reviewed and otherwise negative.   PHYSICAL EXAM:  VS:  There were no vitals taken for this visit. BMI: There is no height or weight on file to calculate BMI. Well nourished, well developed, in no acute distress HEENT: normocephalic, atraumatic Neck: no JVD, carotid  bruits or masses Cardiac:  RRR; no significant murmurs, no rubs, or gallops Lungs:  CTA b/l, no wheezing, rhonchi or rales Abd: soft, nontender MS: no deformity or atrophy Ext: no edema Skin: warm and dry, no rash Neuro:  No gross deficits appreciated Psych: euthymic mood, full affect  PPM site is stable, no tethering or discomfort   EKG:  not done today  Device interrogation done today and reviewed by myself:  Battery and lead measurements are stable He has had a number of AMS/AHR episodes Most are markers only, a couple with EGMs appears to be brief AFlutters, not new going back to 2022, rare and very brief 2 NSVTs (since 2022), last Aug 2023   07/04/21: TTE 1. There is prominent apex-to-base and septal lateral dyssynchrony due to  RV apical pacing. No evidence of residual VSD. Left ventricular ejection  fraction, by estimation, is 55 to 60%. The left ventricle has normal  function. The left ventricle has no  regional wall motion abnormalities. Left ventricular diastolic parameters  were normal.   2. Right ventricular systolic function is normal. The right ventricular  size is normal. There is normal pulmonary artery systolic pressure.   3. Left atrial size was mildly dilated.   4. Right atrial size was mildly dilated.   5. The mitral valve is normal in structure. Trivial mitral valve  regurgitation.   6. Increased gradients across the tricuspid valve due to previous repair.  Mean gradient is 5 mm Hg at aheart rate of 68 bpm. Pressure half time 133  ms. The tricuspid valve is has been repaired/replaced.   7. The aortic valve has been repaired/replaced. Aortic valve  regurgitation is mild. Moderate aortic valve stenosis. There is a  homograft valve present in the aortic position. Procedure Date: 2015.  Aortic valve mean gradient measures 24.0 mmHg. Aortic  valve Vmax measures 3.24 m/s.   8. The inferior vena cava is normal in size with greater than 50%  respiratory  variability, suggesting right atrial pressure of 3 mmHg.   Comparison(s): The left ventricular function has improved. There is a  slight worsening of the aortic valve gradients, but this appears to be due  to improved cardiac output (unchanged dimensionless index).    Recent Labs: No results found for requested labs within last 365 days.  No results found for requested labs within last 365 days.   CrCl cannot be calculated (Patient's most recent lab result is older than the maximum 21 days allowed.).   Wt Readings from Last 3 Encounters:  06/12/22 234 lb 12.8 oz (106.5 kg)  06/07/21 241 lb 9.6 oz (109.6 kg)  11/03/20 231 lb 3.2 oz (104.9 kg)     Other studies reviewed: Additional studies/records reviewed today include: summarized above  ASSESSMENT AND PLAN:  PPM Intact function No programming changes made   Structural heart  disease, endocarditis 2015 Aortic root and valve replacement with fistula repair  2016 Redo Median Sternotomy, Repair of a VSD, Tricuspid Valve Repair, and Redo closure of PFO  AV mass > treated with ABX 2016, not appreciated on last echo C/w Dr. Jens Som and team  3. Rare, brief AFlutters CHA2DS2Vasc is zero Follow burden  4. Anxiety about his  health He worries quite a bit about his health (understandably), and his PPM failing again. Offered consult with Dr. Tanya Nones, he would very much like to see him Will send a referral   Disposition: F/u with remotes as usual, in clinic with EP again in a year, sooner if needed  Current medicines are reviewed at length with the patient today.  The patient did not have any concerns regarding medicines.  Norma Fredrickson, PA-C 07/03/2022 12:45 PM     [facility] [address] [place] [address] [phone] (office)  [phone] (fax)

## 2022-07-05 ENCOUNTER — Ambulatory Visit: Payer: 59 | Attending: Physician Assistant | Admitting: Physician Assistant

## 2022-07-05 ENCOUNTER — Encounter: Payer: Self-pay | Admitting: Physician Assistant

## 2022-07-05 VITALS — BP 114/82 | HR 74 | Ht 66.0 in | Wt 231.4 lb

## 2022-07-05 DIAGNOSIS — I4892 Unspecified atrial flutter: Secondary | ICD-10-CM

## 2022-07-05 DIAGNOSIS — Z952 Presence of prosthetic heart valve: Secondary | ICD-10-CM

## 2022-07-05 DIAGNOSIS — Z950 Presence of cardiac pacemaker: Secondary | ICD-10-CM

## 2022-07-05 DIAGNOSIS — Z9889 Other specified postprocedural states: Secondary | ICD-10-CM | POA: Diagnosis not present

## 2022-07-05 NOTE — Patient Instructions (Signed)
Medication Instructions:    Your physician recommends that you continue on your current medications as directed. Please refer to the Current Medication list given to you today.   *If you need a refill on your cardiac medications before your next appointment, please call your pharmacy*   Lab Work: NONE ORDERED  TODAY    If you have labs (blood work) drawn today and your tests are completely normal, you will receive your results only by: MyChart Message (if you have MyChart) OR A paper copy in the mail If you have any lab test that is abnormal or we need to change your treatment, we will call you to review the results.   Testing/Procedures: NONE ORDERED  TODAY   Follow-Up: At [facility], you and your health needs are our priority.  As part of our continuing mission to provide you with exceptional heart care, we have created designated Provider Care Teams.  These Care Teams include your primary Cardiologist (physician) and Advanced Practice Providers (APPs -  Physician Assistants and Nurse Practitioners) who all work together to provide you with the care you need, when you need it.  We recommend signing up for the patient portal called "MyChart".  Sign up information is provided on this After Visit Summary.  MyChart is used to connect with patients for Virtual Visits (Telemedicine).  Patients are able to view lab/test results, encounter notes, upcoming appointments, etc.  Non-urgent messages can be sent to your provider as well.   To learn more about what you can do with MyChart, go to [email or web address].    Your next appointment:   1 year(s)  Provider:   You may see Gregg Taylor, MD or one of the following Advanced Practice Providers on your designated Care Team:   Renee Ursuy, PA-C Michael "Andy" Tillery, PA-C Suzann Riddle, NP  Other Instructions  

## 2022-07-06 ENCOUNTER — Other Ambulatory Visit: Payer: Self-pay | Admitting: *Deleted

## 2022-07-06 DIAGNOSIS — F419 Anxiety disorder, unspecified: Secondary | ICD-10-CM

## 2022-07-10 ENCOUNTER — Ambulatory Visit (HOSPITAL_COMMUNITY): Payer: 59 | Attending: Cardiology

## 2022-07-10 DIAGNOSIS — I350 Nonrheumatic aortic (valve) stenosis: Secondary | ICD-10-CM

## 2022-07-10 DIAGNOSIS — I428 Other cardiomyopathies: Secondary | ICD-10-CM | POA: Insufficient documentation

## 2022-07-10 DIAGNOSIS — Z954 Presence of other heart-valve replacement: Secondary | ICD-10-CM | POA: Diagnosis not present

## 2022-07-10 LAB — ECHOCARDIOGRAM COMPLETE
AV Mean grad: 24.5 mmHg
AV Peak grad: 44.2 mmHg
Ao pk vel: 3.33 m/s
Area-P 1/2: 3.91 cm2
S' Lateral: 4 cm

## 2022-07-27 NOTE — Progress Notes (Signed)
Remote pacemaker transmission.   

## 2022-09-13 ENCOUNTER — Ambulatory Visit: Payer: 59 | Admitting: Mental Health

## 2022-09-13 DIAGNOSIS — F411 Generalized anxiety disorder: Secondary | ICD-10-CM | POA: Diagnosis not present
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
## 2022-09-27 ENCOUNTER — Ambulatory Visit: Payer: 59 | Admitting: Mental Health

## 2022-10-01 ENCOUNTER — Ambulatory Visit (INDEPENDENT_AMBULATORY_CARE_PROVIDER_SITE_OTHER): Payer: 59

## 2022-10-01 DIAGNOSIS — I442 Atrioventricular block, complete: Secondary | ICD-10-CM

## 2022-10-01 LAB — CUP PACEART REMOTE DEVICE CHECK
Battery Remaining Longevity: 102 mo
Battery Voltage: 3 V
Brady Statistic AP VP Percent: 15.41 %
Brady Statistic AP VS Percent: 0 %
Brady Statistic AS VP Percent: 84.58 %
Brady Statistic AS VS Percent: 0.02 %
Brady Statistic RA Percent Paced: 15.39 %
Brady Statistic RV Percent Paced: 99.98 %
Date Time Interrogation Session: 20240818221140
Implantable Lead Connection Status: 753985
Implantable Lead Connection Status: 753985
Implantable Lead Implant Date: 20151123
Implantable Lead Implant Date: 20210222
Implantable Lead Location: 753859
Implantable Lead Location: 753860
Implantable Lead Model: 5076
Implantable Lead Model: 5076
Implantable Pulse Generator Implant Date: 20210222
Lead Channel Impedance Value: 342 Ohm
Lead Channel Impedance Value: 361 Ohm
Lead Channel Impedance Value: 551 Ohm
Lead Channel Impedance Value: 646 Ohm
Lead Channel Pacing Threshold Amplitude: 0.5 V
Lead Channel Pacing Threshold Amplitude: 0.5 V
Lead Channel Pacing Threshold Pulse Width: 0.4 ms
Lead Channel Pacing Threshold Pulse Width: 0.4 ms
Lead Channel Sensing Intrinsic Amplitude: 1.375 mV
Lead Channel Sensing Intrinsic Amplitude: 1.375 mV
Lead Channel Sensing Intrinsic Amplitude: 7.875 mV
Lead Channel Sensing Intrinsic Amplitude: 7.875 mV
Lead Channel Setting Pacing Amplitude: 1.5 V
Lead Channel Setting Pacing Amplitude: 2.5 V
Lead Channel Setting Pacing Pulse Width: 0.4 ms
Lead Channel Setting Sensing Sensitivity: 5.6 mV
Zone Setting Status: 755011
Zone Setting Status: 755011

## 2022-10-10 NOTE — Progress Notes (Signed)
Remote pacemaker transmission.   

## 2022-10-25 ENCOUNTER — Ambulatory Visit: Payer: 59 | Admitting: Mental Health
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
## 2022-12-31 ENCOUNTER — Ambulatory Visit: Payer: 59

## 2022-12-31 DIAGNOSIS — I442 Atrioventricular block, complete: Secondary | ICD-10-CM | POA: Diagnosis not present

## 2023-01-01 LAB — CUP PACEART REMOTE DEVICE CHECK
Battery Remaining Longevity: 100 mo
Battery Voltage: 3 V
Brady Statistic AP VP Percent: 15.27 %
Brady Statistic AP VS Percent: 0 %
Brady Statistic AS VP Percent: 84.72 %
Brady Statistic AS VS Percent: 0.01 %
Brady Statistic RA Percent Paced: 15.26 %
Brady Statistic RV Percent Paced: 99.99 %
Date Time Interrogation Session: 20241118092639
Implantable Lead Connection Status: 753985
Implantable Lead Connection Status: 753985
Implantable Lead Implant Date: 20151123
Implantable Lead Implant Date: 20210222
Implantable Lead Location: 753859
Implantable Lead Location: 753860
Implantable Lead Model: 5076
Implantable Lead Model: 5076
Implantable Pulse Generator Implant Date: 20210222
Lead Channel Impedance Value: 342 Ohm
Lead Channel Impedance Value: 380 Ohm
Lead Channel Impedance Value: 589 Ohm
Lead Channel Impedance Value: 665 Ohm
Lead Channel Pacing Threshold Amplitude: 0.5 V
Lead Channel Pacing Threshold Amplitude: 0.5 V
Lead Channel Pacing Threshold Pulse Width: 0.4 ms
Lead Channel Pacing Threshold Pulse Width: 0.4 ms
Lead Channel Sensing Intrinsic Amplitude: 1.375 mV
Lead Channel Sensing Intrinsic Amplitude: 1.375 mV
Lead Channel Sensing Intrinsic Amplitude: 7.875 mV
Lead Channel Sensing Intrinsic Amplitude: 7.875 mV
Lead Channel Setting Pacing Amplitude: 1.5 V
Lead Channel Setting Pacing Amplitude: 2.5 V
Lead Channel Setting Pacing Pulse Width: 0.4 ms
Lead Channel Setting Sensing Sensitivity: 5.6 mV
Zone Setting Status: 755011
Zone Setting Status: 755011

## 2023-01-23 NOTE — Progress Notes (Signed)
Remote pacemaker transmission.   

## 2023-03-20 ENCOUNTER — Telehealth: Payer: Self-pay | Admitting: Cardiology

## 2023-03-20 ENCOUNTER — Emergency Department (HOSPITAL_BASED_OUTPATIENT_CLINIC_OR_DEPARTMENT_OTHER): Payer: 59

## 2023-03-20 ENCOUNTER — Other Ambulatory Visit: Payer: Self-pay

## 2023-03-20 ENCOUNTER — Encounter (HOSPITAL_BASED_OUTPATIENT_CLINIC_OR_DEPARTMENT_OTHER): Payer: Self-pay | Admitting: Emergency Medicine

## 2023-03-20 ENCOUNTER — Emergency Department (HOSPITAL_BASED_OUTPATIENT_CLINIC_OR_DEPARTMENT_OTHER)
Admission: EM | Admit: 2023-03-20 | Discharge: 2023-03-20 | Disposition: A | Discharge status: Home / Self Care | Payer: 59 | Attending: Emergency Medicine | Admitting: Emergency Medicine

## 2023-03-20 DIAGNOSIS — R42 Dizziness and giddiness: Secondary | ICD-10-CM | POA: Diagnosis present

## 2023-03-20 DIAGNOSIS — R0602 Shortness of breath: Secondary | ICD-10-CM | POA: Diagnosis not present

## 2023-03-20 DIAGNOSIS — Z20822 Contact with and (suspected) exposure to covid-19: Secondary | ICD-10-CM | POA: Insufficient documentation

## 2023-03-20 LAB — D-DIMER, QUANTITATIVE: D-Dimer, Quant: <0.27 ug{FEU}/mL (ref 0.00–0.50)

## 2023-03-20 LAB — CBC
HCT: 45.7 % (ref 39.0–52.0)
Hemoglobin: 15.8 g/dL (ref 13.0–17.0)
MCH: 30.2 pg (ref 26.0–34.0)
MCHC: 34.6 g/dL (ref 30.0–36.0)
MCV: 87.4 fL (ref 80.0–100.0)
Platelets: 212 10*3/uL (ref 150–400)
RBC: 5.23 MIL/uL (ref 4.22–5.81)
RDW: 12.9 % (ref 11.5–15.5)
WBC: 6.1 10*3/uL (ref 4.0–10.5)
nRBC: 0 % (ref 0.0–0.2)

## 2023-03-20 LAB — TROPONIN I (HIGH SENSITIVITY)
Troponin I (High Sensitivity): 2 ng/L (ref <18)
Troponin I (High Sensitivity): 2 ng/L (ref <18)

## 2023-03-20 LAB — BASIC METABOLIC PANEL
Anion gap: 7 (ref 5–15)
BUN: 21 mg/dL — ABNORMAL HIGH (ref 6–20)
CO2: 26 mmol/L (ref 22–32)
Calcium: 9.4 mg/dL (ref 8.9–10.3)
Chloride: 104 mmol/L (ref 98–111)
Creatinine, Ser: 1.14 mg/dL (ref 0.61–1.24)
GFR, Estimated: >60 mL/min (ref >60)
Glucose, Bld: 99 mg/dL (ref 70–99)
Potassium: 4 mmol/L (ref 3.5–5.1)
Sodium: 137 mmol/L (ref 135–145)

## 2023-03-20 LAB — RESP PANEL BY RT-PCR (RSV, FLU A&B, COVID)  RVPGX2
Influenza A by PCR: NEGATIVE
Influenza B by PCR: NEGATIVE
Resp Syncytial Virus by PCR: NEGATIVE
SARS Coronavirus 2 by RT PCR: NEGATIVE

## 2023-03-20 NOTE — Telephone Encounter (Signed)
 Pt c/o Shortness Of Breath: STAT if SOB developed within the last 24 hours or pt is noticeably SOB on the phone  1. Are you currently SOB (can you hear that pt is SOB on the phone)? yes  2. How long have you been experiencing SOB? 2 days   3. Are you SOB when sitting or when up moving around? both  4. Are you currently experiencing any other symptoms? Feeling fatigued, feels like he is in a fog

## 2023-03-20 NOTE — ED Provider Notes (Signed)
 [facility] Provider Note   CSN: [id number] Arrival date & time: 03/20/23  1515     History  Chief Complaint  Patient presents with   Shortness of Breath    [name] is a 38 y.o. male with PMHx headaches, congestive dilated cardiomyopathy s/p pacemaker and valve replacements, who presents to ED concerned for intermittent episodes of spacing out and associated SOB. These episodes last for a couple of seconds. Not associated with exertion vs rest.  Denies fever, chest pain, cough, nausea, vomiting, diarrhea.   Shortness of Breath      Home Medications Prior to Admission medications   Medication Sig Start Date End Date Taking? Authorizing Provider  metoprolol  succinate (TOPROL -XL) 25 MG 24 hr tablet TAKE ONE TABLET BY MOUTH DAILY 06/18/22  Yes Crenshaw, Redell RAMAN, MD  acetaminophen  (TYLENOL ) 500 MG tablet Take 1,000 mg by mouth every 6 (six) hours as needed for moderate pain or headache.    [provider]  aspirin -acetaminophen -caffeine (EXCEDRIN MIGRAINE) 250-250-65 MG tablet Take by mouth every 6 (six) hours as needed for headache.    [provider]      Allergies    Cefepime  and Vancomycin     Review of Systems   Review of Systems  Respiratory:  Positive for shortness of breath.     Physical Exam Updated Vital Signs BP 105/79   Pulse 62   Temp 97.6 F (36.4 C) (Oral)   Resp (!) 22   Ht 5' 5 (1.651 m)   Wt 111.1 kg   SpO2 99%   BMI 40.77 kg/m  Physical Exam Vitals and nursing note reviewed.  Constitutional:      General: He is not in acute distress.    Appearance: He is not ill-appearing, toxic-appearing or diaphoretic.  HENT:     Head: Normocephalic and atraumatic.     Mouth/Throat:     Mouth: Mucous membranes are moist.     Pharynx: No posterior oropharyngeal erythema.  Eyes:     General: No scleral icterus.       Right eye: No discharge.        Left eye: No discharge.      Conjunctiva/sclera: Conjunctivae normal.  Cardiovascular:     Rate and Rhythm: Normal rate and regular rhythm.     Pulses: Normal pulses.     Heart sounds: Normal heart sounds. No murmur heard. Pulmonary:     Effort: Pulmonary effort is normal. No respiratory distress.     Breath sounds: Normal breath sounds. No wheezing, rhonchi or rales.  Abdominal:     Tenderness: There is no abdominal tenderness.  Musculoskeletal:     Right lower leg: No edema.     Left lower leg: No edema.     Comments: No calf tenderness to palpation  Skin:    General: Skin is warm and dry.     Findings: No rash.  Neurological:     General: No focal deficit present.     Mental Status: He is alert and oriented to person, place, and time. Mental status is at baseline.     Comments: GCS 15. Speech is goal oriented. No deficits appreciated to CN III-XII. Patient has equal grip strength bilaterally with 5/5 strength against resistance in all major muscle groups bilaterally. Sensation to light touch intact. Patient moves extremities without ataxia. Patient ambulatory with steady gait.   Psychiatric:        Mood and Affect: Mood normal.  Behavior: Behavior normal.     ED Results / Procedures / Treatments   Labs (all labs ordered are listed, but only abnormal results are displayed) Labs Reviewed  BASIC METABOLIC PANEL - Abnormal; Notable for the following components:      Result Value   BUN 21 (*)    All other components within normal limits  RESP PANEL BY RT-PCR (RSV, FLU A&B, COVID)  RVPGX2  CBC  D-DIMER, QUANTITATIVE  TROPONIN I (HIGH SENSITIVITY)  TROPONIN I (HIGH SENSITIVITY)    EKG EKG Interpretation Date/Time:  Wednesday March 20 2023 15:36:44 EST Ventricular Rate:  65 PR Interval:  154 QRS Duration:  174 QT Interval:  438 QTC Calculation: 455 R Axis:   187  Text Interpretation: Atrial-sensed ventricular-paced rhythm Abnormal ECG When compared with ECG of 06-Jun-2020 10:47, PREVIOUS  ECG IS PRESENT Similar to 2022 tracing Confirmed by Darra Chew [phone]) on 03/20/2023 3:53:27 PM  Radiology DG Chest 2 View Result Date: 03/20/2023 CLINICAL DATA:  Shortness of breath. EXAM: CHEST - 2 VIEW COMPARISON:  06/06/2020. FINDINGS: Bilateral lung fields are clear. Bilateral costophrenic angles are clear. Normal cardio-mediastinal silhouette. There is a left sided 2-lead pacemaker. Median sternotomy noted. No acute osseous abnormalities. The soft tissues are within normal limits. IMPRESSION: No active cardiopulmonary disease. Electronically Signed   By: Ree Molt M.D.   On: 03/20/2023 17:38    Procedures Procedures    Medications Ordered in ED Medications - No data to display  ED Course/ Medical Decision Making/ A&P                                 Medical Decision Making Amount and/or Complexity of Data Reviewed Labs: ordered. Radiology: ordered.   This patient presents to the ED for concern of weakness, this involves an extensive number of treatment options, and is a complaint that carries with it a high risk of complications and morbidity.  The differential diagnosis includes Ischemic stroke, intracerebral hemorrhage, subarachnoid hemorrhage, Guillain-Barr syndrome, hypoglycemia, electrolyte abnormality, sepsis, ACS, carbon monoxide poisoning, anemia, dehydration.   Co morbidities that complicate the patient evaluation  headaches, congestive dilated cardiomyopathy s/p pacemaker and valve replacements   Additional history obtained:  Additional history obtained from 06/2022 ECHO: EF 55-60%   Problem List / ED Course / Critical interventions / Medication management  Patient presents to ED with vague intermittent symptoms.  Endorsing episodes of spacing out and associated SOB.  Patient stating that his anxiety has been high recently, but was referred by his cardiologist to the ED for further cardiac workup.  No other infectious symptoms recently.  Physical exam  unremarkable.  Patient afebrile with stable vitals. I Ordered, and personally interpreted labs.  Respiratory panel negative.  D-dimer within normal limits.  CBC without leukocytosis or anemia.  BMP reassuring.  Initial and repeat troponin within normal limits. The patient was maintained on a cardiac monitor.  I personally viewed and interpreted the EKG/cardiac monitored which showed an underlying rhythm of: Sinus rhythm with no acute changes I ordered imaging studies including chest x-ray to for process contributing patient's symptoms. I independently visualized and interpreted imaging which showed no acute cardiopulmonary disease. I agree with the radiologist interpretation. Shared results with patient.  Patient stating that he is able to have quick follow-up with his cardiologist.  Offered patient further imaging/workup today.  Patient declining stating that he just wants to go home.  Patient is thinking that it  might just be his anxiety causing his symptoms.  Educated patient to seek emergency care if experiencing any new or worsening symptoms.  Patient verbalized understanding of plan. I have reviewed the patients home medicines and have made adjustments as needed Patient afebrile with stable vitals.  Provided with return precautions.  Discharged in good condition.   DDX: these are considered less likely due to history of present illness and physical exam findings -Ischemic stroke/ICH/SAH: no neurodeficits -Hypoglycemia/electrolyte abnormality: CMP/BMP without concern -sepsis: no fever; vital signs stable -ACS: EKG sinus rhythm and no acute ST changes -anemia, dehydration: CBC/CMP/BMP without concern -Cardiac: trop and d-dimer reassuring   Social Determinants of Health:  none         Final Clinical Impression(s) / ED Diagnoses Final diagnoses:  Intermittent lightheadedness    Rx / DC Orders ED Discharge Orders     None         Hoy Nidia JULIANNA DEVONNA 03/20/23  2202    Darra Fonda MATSU, MD 03/21/23 1225

## 2023-03-20 NOTE — ED Notes (Signed)
Pt. Reports no chest pain only here due to having episodes of shortness of breath and he has a cardiac history.  Pt. Has a pacemaker.

## 2023-03-20 NOTE — ED Triage Notes (Addendum)
 States has felt "weird " for the last 5 days. Weak and dizzy, no CP but SOB x 2 days.. No acute distress noted in triage, looking on cellphone while this nurse is typing. Cardiac hx, has pacemaker

## 2023-03-20 NOTE — Telephone Encounter (Signed)
 Pt c/o Shortness Of Breath: STAT if SOB developed within the last 24 hours or pt is noticeably SOB on the phone   1. Are you currently SOB (can you hear that pt is SOB on the phone)? yes   2. How long have you been experiencing SOB? 2 days    3. Are you SOB when sitting or when up moving around? both   4. Are you currently experiencing any other symptoms? Feeling fatigued, feels like he is in a fog    Attempted to call phone number on chart twice and no answer and no availability to leave a VMM either.

## 2023-03-20 NOTE — Discharge Instructions (Addendum)
 It was a pleasure caring for you today.  As discussed, please urgently follow-up with your outpatient cardiologist.  Seek emergency care if experiencing any new or worsening symptoms.

## 2023-03-20 NOTE — Telephone Encounter (Signed)
 Called and spoke with spouse Sirena who states she was on her way home to check on the patient. Informed her given his extension cardiac surgical history and having not seen Dr Pietro in almost one year, given the new onset shortness of breath it was best that she take him to the closest ER for a full evaluation.  She voiced understanding and states she will drive him to the [facility] ER and will updated us  on his condition.

## 2023-03-27 NOTE — Progress Notes (Unsigned)
 Cardiology Clinic Note   Patient Name: [name] Date of Encounter: 03/29/2023  Primary Care Provider:  [name], MD Primary Cardiologist:  Olga Millers, MD  Patient Profile    [name] presents to the clinic today for follow-up evaluation of his nonischemic cardiomyopathy and palpitations.  Past Medical History    Past Medical History:  Diagnosis Date   AKI (acute kidney injury) (HCC)    Aortic valve vegetation on echo 04/29/14 04/29/2014   managed medically   Brain abscess    Chest pain 03/02/2020   Congestive dilated cardiomyopathy (HCC) 06/23/2015   Drug-induced leukopenia (HCC) 06/15/2014   Endocarditis of aortic valve-November 2015    Fracture of left femur (HCC)    History of open heart surgery    December 30, 2013- ascending aortic root replacement, TEE   Lightheaded 03/02/2020   Meningitis    Group B Strep (November 2015)   Migraine headache 10/15/2019   since 2008   Obesity    Patent foramen ovale    Closed during procedure on December 30, 2013   Presence of permanent cardiac pacemaker 01/04/2014   PPM MEDTRONIC FOR COMPLETE HEART BLOCK   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   S/P aortic root and valve allograft 12/30/2013   Human allograft aortic root replacement with repair of aorta-right atrial fistula and tricuspid valve repair   S/P placement of cardiac pacemaker 01/04/2014   Medtronic (serial number [id number]) dual chamber permanent pacemaker implanted by Dr Ladona Ridgel   S/P redo patent foramen ovale closure 02/12/2014   S/P redo tricuspid valve repair 02/12/2014   Complex valvuloplasty including patch closure of VSD with plication of septal leaflet and 26 mm Edwards mc3 ring annuloplasty   S/P ventricular septal defect repair + redo tricuspid valve repair + redo closure PFO 02/12/2014   Redo sternotomy for bovine pericardial patch repair of large ventricular septal defect (recurrent LVOT to RA fistula due to bacterial endocarditis)    Smokeless tobacco use    Third degree heart block (HCC) 12/31/2013   Thrombocytopenia (HCC) 12/2013   Tricuspid regurgitation 02/11/2014   Ventricular septal defect 02/12/2014   Post-infectious s/p repair of LVOT to RA fistula for bacterial endocarditis   Past Surgical History:  Procedure Laterality Date   ASCENDING AORTIC ROOT REPLACEMENT N/A 12/30/2013   Procedure: ASCENDING AORTIC ROOT REPLACEMENT with 23mm HOMOGRAFT, CLOSURE OF AORTIC TO RIGHT ATRIAL FISTULA, CLOSURE OF PATENT FORAMEN OVALE;  Surgeon: Delight Ovens, MD;  Location: [facility] OR;  Service: Open Heart Surgery;  Laterality: N/A;   INSERT / REPLACE / REMOVE PACEMAKER  01/04/2014   INTRAOPERATIVE TRANSESOPHAGEAL ECHOCARDIOGRAM N/A 12/30/2013   Procedure: INTRAOPERATIVE TRANSESOPHAGEAL ECHOCARDIOGRAM;  Surgeon: Delight Ovens, MD;  Location: [facility] OR;  Service: Open Heart Surgery;  Laterality: N/A;   PACEMAKER INSERTION Left 04/06/2019   Procedure: PACEMAKER LEAD INSERTION;  Surgeon: Marinus Maw, MD;  Location: [facility] OR;  Service: Cardiovascular;  Laterality: Left;   PACEMAKER LEAD REMOVAL N/A 04/06/2019   Procedure: PACEMAKER LEAD REMOVAL;  Surgeon: Marinus Maw, MD;  Location: [facility] OR;  Service: Cardiovascular;  Laterality: N/A;   PERMANENT PACEMAKER INSERTION N/A 01/04/2014   Procedure: PERMANENT PACEMAKER INSERTION;  Surgeon: Marinus Maw, MD;  Location: [facility] CATH LAB;  Service: Cardiovascular;  Laterality: N/A;   RIGHT HEART CATHETERIZATION N/A 02/12/2014   Procedure: RIGHT HEART CATH;  Surgeon: Laurey Morale, MD;  Location: [facility] CATH LAB;  Service: Cardiovascular;  Laterality: N/A;   TEE WITHOUT CARDIOVERSION N/A 02/12/2014  Procedure: TRANSESOPHAGEAL ECHOCARDIOGRAM (TEE);  Surgeon: Quintella Reichert, MD;  Location: [facility] ENDOSCOPY;  Service: Cardiovascular;  Laterality: N/A;   TEE WITHOUT CARDIOVERSION N/A 04/30/2014   Procedure: TRANSESOPHAGEAL ECHOCARDIOGRAM (TEE);  Surgeon: Jake Bathe, MD;  Location: [facility] ENDOSCOPY;  Service:  Cardiovascular;  Laterality: N/A;   TRICUSPID VALVE REPLACEMENT N/A 12/30/2013   Procedure: REPAIR OF TRICUSPID VALVE LEAFLET;  Surgeon: Delight Ovens, MD;  Location: [facility] OR;  Service: Open Heart Surgery;  Laterality: N/A;   TRICUSPID VALVE REPLACEMENT N/A 02/12/2014   Procedure: TRICUSPID VALVE REPAIR, REDO MEDIAN STERNOTOMY, REPAIR OF VENTRICULAR SEPTAL DEFECT (RECURRENT LV OUTFLOW TRACT TO RIGHT ATRIAL FISTULA), REDO CLOSURE OF PATENT FORAMEN OVALE;  Surgeon: Purcell Nails, MD;  Location: [facility] OR;  Service: Open Heart Surgery;  Laterality: N/A;   VENOGRAM Left 04/06/2019   Procedure: Venogram;  Surgeon: Marinus Maw, MD;  Location: [facility] OR;  Service: Cardiovascular;  Laterality: Left;    Allergies  Allergies  Allergen Reactions   Cefepime Other (See Comments)    Leukopenia not clear if due to vancomycin vs cefepime. WBC dropped down   Vancomycin Other (See Comments)    ? Drug induced leukopenia vs being due to cefepime. WBC dropped down    History of Present Illness    [name] has a PMH of  aortic valve endocarditis 11/15 status post aortic root replacement/AVR and closure of PFO and TV repair, redo sternotomy 1/16 with repair of VSD, TV, and redo PFO closure, complete heart block postoperative requiring PPM, nonischemic cardiomyopathy with EF of 45-50%, meningitis, and migraines.   He was seen by Dr. Jens Som while admitted 11/15 for meningitis due to group B strep.  Need TEE showed normal LV function with a large aortic valve regurgitation and severe AI, large tricuspid valve regurgitation and moderate TR, and a fistula from the aorta to the right atrium.  He became acutely unstable with severe AI had to be intubated.  Cardiothoracic surgery was contacted and he underwent aortic root replacement, AVR, tricuspid valve repair, and closure of PFO by Dr. Lowella Fairy 12/30/2013.  During his procedure he was found to have a 3 cm fistula between the LVOT and right atrium.  He developed complete  heart block and underwent PPM placement on 01/04/2014.  He was readmitted on 12/15 after developing acute shortness of breath.  TEE showed a large VSD with left-to-right shunting from the LVOT as well as disruption of the previous surgery.  Fistula and aortic root abscess.  He underwent redo sternotomy with repair of VSD, tricuspid valve repair, redo PFO closure by Dr. Freida Busman on 12/14/2014.  A new cardiac murmur was heard on 3/16.  Echocardiogram showed aortic vegetation.  He was again admitted he remained afebrile throughout his entire admission.  His blood cultures were were negative.  TEE showed a large mobile irregular bordered echodensity along the anterior aspect of the subclavicular apparatus of the bioprosthetic aortic valve.  He was seen by Dr. Tyrone Sage who recommended against a third redo.  Infectious disease was consulted and IV antibiotics were resumed.   He presented to the emergency department 2/21 with dizziness and dyspnea.  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  He underwent lead extraction 04/06/2019 and a new Medtronic RV lead was placed along with a Medtronic dual-chamber pacemaker.  Due to persistent dizziness his metoprolol was discontinued.  His device was interrogated in the office after his ED visit for rapid heartbeat and dizziness.  It was felt that his  symptoms were lasting much longer than his recorded AT/AF.  Patient was concerned due to his similarity of his previous symptoms which was a diagnosis of endocarditis.  It was discovered that he had not been taking his antibiotics since it was device change.  His repeat labs showed normal CBC and his blood cultures were negative.  And echocardiogram showed an LVEF of 40-45%, mild AI, mild to moderate AAS but no obvious vegetation.  He was restarted on amoxicillin and advised follow-up with Dr. Algis Liming.  He was seen and referred to neurology for follow-up of his dizziness.  This was not felt to be cardiac in nature.   He sent a MyChart  message on 03/27/2020 with concerns of low heart rate, feeling lightheaded, and extreme fatigue.  He was worried that his PPM was not working.  Please send a remote transmission and his device was functioning normally.  There were no episodes noted to explain his symptoms.  Contact the office again on 03/29/2020 with concerns about heart rate variability as well as multiple other complaints including several weeks of fatigue, dizziness, shortness of breath, and intermittent chest pain.  He was seen by Otilio Saber, PA-C.  His device was interrogated and showed normal function with no cardiac arrhythmias.  It was noted that his heart rates were in the 90s most the time.  He suggested possible increase in metoprolol.   He reported symptoms of lightheadedness, dizziness, and double vision.  The symptoms had gotten progressively worse and he was referred to neurology.  They recommended a head CT which was unremarkable.  They then recommended MRI brain which could not be completed due to concerns for his pacemaker, and aortic valve mesh.  His symptoms eventually subsided after about 1 month but then returned.  He reported that he woke up at 2 AM and felt his heart racing.  He checked his pulse and it was in the 100s.  He reported dyspnea both at rest and with exertion.  He was seen by pulmonology who ordered PFTs.  He also reported episodes of PND.  He also reported sharp chest pains that would last for seconds and then dissipate.  Hips not felt to be cardiac chest pain.   When he was last seen by Marjie Skiff, PA-C 04/01/2020 he reported that he felt like he may be depressed.  He also stated "I know I sound paranoid but I am not" several times.  He reported that he did not want anything overlooked that would be significant to his health.  He reported he had 2 young daughters and he wanted to do everything he could to be around his lungs possible for them.  His anxiety/depression were reviewed.  He was not open to  starting any new medications but was open to talking with someone.   He presented to the clinic 04/11/20 for follow-up evaluation stated he had frustration because he was not able to increase his physical activity as he would want.  He reported that his pacemaker had a high limits at in 150-160 range.  He reported that as soon as he increased his physical activity due to his fitness level he reached a heart rate in the one forties.  He reports some anxiety related to going to the gym due to his lead fracture that he had while he was increasing his physical activity.  He reported he was told by Dr. Ladona Ridgel that he may not continue to lift weights.  We discussed the events surrounding his  pacemaker wire lead fracture and it did not seem like he was exercising with great intensity during that time.  I  encouraged him to slowly increase his physical activity as tolerated, continue to monitor his diet, I gave him the mindfulness stress reduction sheet, and planned follow-up as scheduled with Marjie Skiff, PA-C.  He was seen in follow-up by Francis Dowse PA-C on 07/05/2022.  He was doing well at that time.  He denied shortness of breath.  He reported that he would like to exercise but worried about lead fracture and device complications.  His device was functioning normally.  No program changes were made.  He was referred to Dr.Schooler to discuss anxiety about health.  He presents to the clinic today for follow-up evaluation and states he has been noticing over the past few weeks that he has not been able to get a deep breath.  He denies sick contacts and other illnesses.  His chest x-ray shows no acute changes.  His lab work has been unremarkable.  He is able to walk at a increased pace without chest discomfort.  His PCP has started him on 10 mg of fluoxetine daily.  He has been taking this for 2 days.  He asks about resistance training.  I have asked him to increase his physical activity as tolerated and be mindful  of his pacemaker restrictions.  I will order an echocardiogram, have him request an early device transmission, and plan follow-up in 3 months.   Today he denies chest pain, shortness of breath, lower extremity edema, fatigue, palpitations, melena, hematuria, hemoptysis, diaphoresis, weakness, presyncope, syncope, orthopnea, and PND.    Home Medications    Prior to Admission medications   Medication Sig Start Date End Date Taking? Authorizing Provider  acetaminophen (TYLENOL) 500 MG tablet Take 1,000 mg by mouth every 6 (six) hours as needed for moderate pain or headache.    [provider]  aspirin-acetaminophen-caffeine (EXCEDRIN MIGRAINE) [phone] MG tablet Take by mouth every 6 (six) hours as needed for headache.    [provider]  metoprolol succinate (TOPROL-XL) 25 MG 24 hr tablet TAKE ONE TABLET BY MOUTH DAILY 06/18/22   Jens Som Madolyn Frieze, MD    Family History    Family History  Problem Relation Age of Onset   Migraines Mother    COPD Father    Migraines Brother    Heart murmur Brother    Migraines Brother    Migraines Maternal Grandmother    Asthma Neg Hx    He indicated that his mother is alive. He indicated that his father is alive. He indicated that both of his brothers are alive. He indicated that his maternal grandmother is deceased. He indicated that his maternal grandfather is deceased. He indicated that his paternal grandmother is deceased. He indicated that his paternal grandfather is deceased. He indicated that his daughter is alive. He indicated that the status of his neg hx is unknown.  Social History    Social History   Socioeconomic History   Marital status: Married    Spouse name: Sirena   Number of children: 2   Years of education: 12   Highest education level: Some college, no degree  Occupational History   Not on file  Tobacco Use   Smoking status: Never   Smokeless tobacco: Former    Quit date: 12/30/2013  Substance and Sexual  Activity   Alcohol use: Yes    Comment: daily   Drug use: No   Sexual  activity: Yes    Birth control/protection: Condom  Other Topics Concern   Not on file  Social History Narrative   Lives with wife, children   Caffeine- coffee, 1 c, 2-3 cans soda a day   Social Drivers of Corporate investment banker Strain: Not on file  Food Insecurity: Not on file  Transportation Needs: Not on file  Physical Activity: Not on file  Stress: Not on file  Social Connections: Not on file  Intimate Partner Violence: Not on file     Review of Systems    General:  No chills, fever, night sweats or weight changes.  Cardiovascular:  No chest pain, dyspnea on exertion, edema, orthopnea, palpitations, paroxysmal nocturnal dyspnea. Dermatological: No rash, lesions/masses Respiratory: No cough, dyspnea Urologic: No hematuria, dysuria Abdominal:   No nausea, vomiting, diarrhea, bright red blood per rectum, melena, or hematemesis Neurologic:  No visual changes, wkns, changes in mental status. All other systems reviewed and are otherwise negative except as noted above.  Physical Exam    VS:  BP 114/80   Pulse 64   Ht 5\' 5"  (1.651 m)   Wt 231 lb 3.2 oz (104.9 kg)   SpO2 97%   BMI 38.47 kg/m  , BMI Body mass index is 38.47 kg/m. GEN: Well nourished, well developed, in no acute distress. HEENT: normal. Neck: Supple, no JVD, carotid bruits, or masses. Cardiac: RRR, no murmurs, rubs, or gallops. No clubbing, cyanosis, edema.  Radials/DP/PT 2+ and equal bilaterally.  Respiratory:  Respirations regular and unlabored, clear to auscultation bilaterally. GI: Soft, nontender, nondistended, BS + x 4. MS: no deformity or atrophy.  Tenderness to deep palpation along left lateral thoracic area/intercostal space Skin: warm and dry, no rash. Neuro:  Strength and sensation are intact. Psych: Normal affect.  Accessory Clinical Findings    Recent Labs: 03/20/2023: BUN 21; Creatinine, Ser 1.14; Hemoglobin 15.8;  Platelets 212; Potassium 4.0; Sodium 137   Recent Lipid Panel    Component Value Date/Time   TRIG 119 12/29/2013 2200         ECG personally reviewed by me today-none today.    Echocardiogram 07/10/2022  FINDINGS   Left Ventricle: Left ventricular ejection fraction, by estimation, is 55  to 60%. The left ventricle has normal function. The left ventricle has no  regional wall motion abnormalities. The left ventricular internal cavity  size was normal in size. There is   no left ventricular hypertrophy. Abnormal (paradoxical) septal motion,  consistent with RV pacemaker. Left ventricular diastolic parameters were  normal.   Right Ventricle: The right ventricular size is normal. No increase in  right ventricular wall thickness. Right ventricular systolic function is  normal. There is normal pulmonary artery systolic pressure. The tricuspid  regurgitant velocity is 1.72 m/s, and   with an assumed right atrial pressure of 8 mmHg, the estimated right  ventricular systolic pressure is 19.8 mmHg.   Left Atrium: Left atrial size was normal in size.   Right Atrium: Right atrial size was normal in size.   Pericardium: There is no evidence of pericardial effusion.   Mitral Valve: The mitral valve is abnormal. Trivial mitral valve  regurgitation.   Tricuspid Valve: Peak and mean TV Gradients are 9 and 4 mmHg,  respectively. The tricuspid valve is has been repaired/replaced. Tricuspid  valve regurgitation is trivial. Mild tricuspid stenosis. The tricuspid  valve is status post repair with an  annuloplasty ring.   Aortic Valve: The aortic valve has been repaired/replaced. Aortic  valve  regurgitation is trivial. Moderate aortic stenosis is present. Aortic  valve mean gradient measures 24.5 mmHg. Aortic valve peak gradient  measures 44.2 mmHg. There is a homograft  valve present in the aortic position. Procedure Date: 12/30/2013.   Pulmonic Valve: The pulmonic valve was not well  visualized. Pulmonic valve  regurgitation is not visualized.   Aorta: The aortic root and ascending aorta are structurally normal, with  no evidence of dilitation.   Venous: The inferior vena cava is dilated in size with greater than 50%  respiratory variability, suggesting right atrial pressure of 8 mmHg.   IAS/Shunts: No atrial level shunt detected by color flow Doppler.       Assessment & Plan   1.  Shortness of breath/DOE-continues to note increased work of breathing with increased physical activity.  Contacted nurse triage line on 03/20/2023.  Reported 2 days of increased shortness of breath and fatigue.  It appears that some of his shortness of breath is related to anxiety versus decreased activity tolerance related to being less physically active.  PCP is started him on fluoxetine 10 mg daily.  He has been taking this for 2 days. Repeat echocardiogram Mindfulness stress reduction she Remote device transmission  Palpitations-heart rate today 64 bpm.  Status post Medtronic PPM due to complete heart block.  Follows with EP. Continue current medical therapy Avoid triggers Maintain physical activity  Nonischemic cardiomyopathy-activity stable at baseline.  Echocardiogram 07/10/2022 showed normal LVEF, normal diastolic parameters, normal RV, normal pulmonary artery systolic pressure, trivial mitral valve regurgitation repaired/replaced and mild tricuspid stenosis.  He was noted to have repaired/replaced aortic valve with trivial regurgitation and moderate aortic valve stenosis.  There were no changes from previous study.   Maintain physical activity Continue metoprolol Heart healthy low-sodium diet  Anxiety-continues to worry about pacemaker leads and pacemaker failing.   Mindfulness stress reduction she Previously referred to Dr. Bosie Clos  Disposition: Follow-up with Dr. Jens Som or me in 3-4 months.   Thomasene Ripple. Lou Irigoyen NP-C     03/29/2023, 2:27 PM [facility] [address] Office [phone] Fax [phone]    I spent 13 minutes examining this patient, reviewing medications, and using patient centered shared decision making involving their cardiac care.   I spent  20 minutes reviewing past medical history,  medications, and prior cardiac tests.

## 2023-03-29 ENCOUNTER — Ambulatory Visit: Payer: 59 | Attending: General Practice | Admitting: General Practice

## 2023-03-29 ENCOUNTER — Encounter: Payer: Self-pay | Admitting: General Practice

## 2023-03-29 VITALS — BP 114/80 | HR 64 | Ht 65.0 in | Wt 231.2 lb

## 2023-03-29 DIAGNOSIS — I428 Other cardiomyopathies: Secondary | ICD-10-CM

## 2023-03-29 DIAGNOSIS — F419 Anxiety disorder, unspecified: Secondary | ICD-10-CM | POA: Diagnosis not present

## 2023-03-29 DIAGNOSIS — R0609 Other forms of dyspnea: Secondary | ICD-10-CM

## 2023-03-29 DIAGNOSIS — R002 Palpitations: Secondary | ICD-10-CM

## 2023-03-29 NOTE — Patient Instructions (Signed)
Medication Instructions:  Your physician recommends that you continue on your current medications as directed. Please refer to the Current Medication list given to you today.  *If you need a refill on your cardiac medications before your next appointment, please call your pharmacy*   Lab Work: NONE ordered at this time of appointment   Testing/Procedures: Your physician has requested that you have an echocardiogram. Echocardiography is a painless test that uses sound waves to create images of your heart. It provides your doctor with information about the size and shape of your heart and how well your heart's chambers and valves are working. This procedure takes approximately one hour. There are no restrictions for this procedure. Please do NOT wear cologne, perfume, aftershave, or lotions (deodorant is allowed). Please arrive 15 minutes prior to your appointment time.  Please note: We ask at that you not bring children with you during ultrasound (echo/ vascular) testing. Due to room size and safety concerns, children are not allowed in the ultrasound rooms during exams. Our front office staff cannot provide observation of children in our lobby area while testing is being conducted. An adult accompanying a patient to their appointment will only be allowed in the ultrasound room at the discretion of the ultrasound technician under special circumstances. We apologize for any inconvenience.    Follow-Up: At [facility], you and your health needs are our priority.  As part of our continuing mission to provide you with exceptional heart care, we have created designated Provider Care Teams.  These Care Teams include your primary Cardiologist (physician) and Advanced Practice Providers (APPs -  Physician Assistants and Nurse Practitioners) who all work together to provide you with the care you need, when you need it.  We recommend signing up for the patient portal called "MyChart".  Sign up  information is provided on this After Visit Summary.  MyChart is used to connect with patients for Virtual Visits (Telemedicine).  Patients are able to view lab/test results, encounter notes, upcoming appointments, etc.  Non-urgent messages can be sent to your provider as well.   To learn more about what you can do with MyChart, go to [email or web address].    Your next appointment:   3 month(s)  Provider:   Edd Fabian, FNP        Other Instructions  Reminder to send Remote message for device transmission. Increase physical activity as tolerated.  Mindfulness-Based Stress Reduction Mindfulness-based stress reduction (MBSR) is a program that helps people learn to practice mindfulness. Mindfulness is the practice of consciously paying attention to the present moment. MBSR focuses on developing self-awareness, which lets you respond to life stress without judgment or negative feelings. It can be learned and practiced through techniques such as education, breathing exercises, meditation, and yoga. MBSR includes several mindfulness techniques in one program. MBSR works best when you understand the treatment, are willing to try new things, and can commit to spending time practicing what you learn. MBSR training may include learning about: How your feelings, thoughts, and reactions affect your body. New ways to respond to things that cause negative thoughts to start (triggers). How to notice your thoughts and let go of them. Practicing awareness of everyday things that you normally do without thinking. The techniques and goals of different types of meditation. What are the benefits of MBSR? MBSR can have many benefits, which include helping you to: Develop self-awareness. This means knowing and understanding yourself. Learn skills and attitudes that help you to take part in  your own health care. Learn new ways to care for yourself. Be more accepting about how things are, and let things  go. Be less judgmental and approach things with an open mind. Be patient with yourself and trust yourself more. MBSR has also been shown to: Reduce negative emotions, such as sadness, overwhelm, and worry. Improve memory and focus. Change how you sense and react to pain. Boost your body's ability to fight infections. Help you connect better with other people. Improve your sense of well-being. How to practice mindfulness To do a basic awareness exercise: Find a comfortable place to sit. Pay attention to the present moment. Notice your thoughts, feelings, and surroundings just as they are. Avoid judging yourself, your feelings, or your surroundings. Make note of any judgment that comes up and let it go. Your mind may wander, and that is okay. Make note of when your thoughts drift, and return your attention to the present moment. To do basic mindfulness meditation: Find a comfortable place to sit. This may include a stable chair or a firm floor cushion. Sit upright with your back straight. Let your arms fall next to your sides, with your hands resting on your legs. If you are sitting in a chair, rest your feet flat on the floor. If you are sitting on a cushion, cross your legs in front of you. Keep your head in a neutral position with your chin dropped slightly. Relax your jaw and rest the tip of your tongue on the roof of your mouth. Drop your gaze to the floor or close your eyes. Breathe normally and pay attention to your breath. Feel the air moving in and out of your nose. Feel your belly expanding and relaxing with each breath. Your mind may wander, and that is okay. Make note of when your thoughts drift, and return your attention to your breath. Avoid judging yourself, your feelings, or your surroundings. Make note of any judgment or feelings that come up, let them go, and bring your attention back to your breath. When you are ready, lift your gaze or open your eyes. Pay attention to how  your body feels after the meditation. Follow these instructions at home:  Find a local in-person or online MBSR program. Set aside some time regularly for mindfulness practice. Practice every day if you can. Even 10 minutes of practice is helpful. Find a mindfulness practice that works best for you. This may include one or more of the following: Meditation. This involves focusing your mind on a certain thought or activity. Breathing awareness exercises. These help you to stay present by focusing on your breath. Body scan. For this practice, you lie down and pay attention to each part of your body from head to toe. You can identify tension and soreness and consciously relax parts of your body. Yoga. Yoga involves stretching and breathing, and it can improve your ability to move and be flexible. It can also help you to test your body's limits, which can help you release stress. Mindful eating. This way of eating involves focusing on the taste, texture, color, and smell of each bite of food. This slows down eating and helps you feel full sooner. For this reason, it can be an important part of a weight loss plan. Find a podcast or recording that provides guidance for breathing awareness, body scan, or meditation exercises. You can listen to these any time when you have a free moment to rest without distractions. Follow your treatment plan as told  by your health care provider. This may include taking regular medicines and making changes to your diet or lifestyle as recommended. Where to find more information You can find more information about MBSR from: Your health care provider. Community-based meditation centers or programs. Programs offered near you. Summary Mindfulness-based stress reduction (MBSR) is a program that teaches you how to consciously pay attention to the present moment. It is used to help you deal better with daily stress, feelings, and pain. MBSR focuses on developing  self-awareness, which allows you to respond to life stress without judgment or negative feelings. MBSR programs may involve learning different mindfulness practices, such as breathing exercises, meditation, yoga, body scan, or mindful eating. Find a mindfulness practice that works best for you, and set aside time for it on a regular basis. This information is not intended to replace advice given to you by your health care provider. Make sure you discuss any questions you have with your health care provider. Document Revised: 09/08/2020 Document Reviewed: 09/08/2020 Elsevier Patient Education  2024 [employer].

## 2023-04-01 ENCOUNTER — Ambulatory Visit (INDEPENDENT_AMBULATORY_CARE_PROVIDER_SITE_OTHER): Payer: 59

## 2023-04-01 DIAGNOSIS — I442 Atrioventricular block, complete: Secondary | ICD-10-CM

## 2023-04-01 LAB — CUP PACEART REMOTE DEVICE CHECK
Battery Remaining Longevity: 97 mo
Battery Voltage: 3 V
Brady Statistic AP VP Percent: 28.37 %
Brady Statistic AP VS Percent: 0 %
Brady Statistic AS VP Percent: 71.61 %
Brady Statistic AS VS Percent: 0.02 %
Brady Statistic RA Percent Paced: 28.34 %
Brady Statistic RV Percent Paced: 99.98 %
Date Time Interrogation Session: 20250216215545
Implantable Lead Connection Status: 753985
Implantable Lead Connection Status: 753985
Implantable Lead Implant Date: 20151123
Implantable Lead Implant Date: 20210222
Implantable Lead Location: 753859
Implantable Lead Location: 753860
Implantable Lead Model: 5076
Implantable Lead Model: 5076
Implantable Pulse Generator Implant Date: 20210222
Lead Channel Impedance Value: 342 Ohm
Lead Channel Impedance Value: 380 Ohm
Lead Channel Impedance Value: 570 Ohm
Lead Channel Impedance Value: 665 Ohm
Lead Channel Pacing Threshold Amplitude: 0.5 V
Lead Channel Pacing Threshold Amplitude: 0.5 V
Lead Channel Pacing Threshold Pulse Width: 0.4 ms
Lead Channel Pacing Threshold Pulse Width: 0.4 ms
Lead Channel Sensing Intrinsic Amplitude: 2 mV
Lead Channel Sensing Intrinsic Amplitude: 2 mV
Lead Channel Sensing Intrinsic Amplitude: 7.875 mV
Lead Channel Sensing Intrinsic Amplitude: 7.875 mV
Lead Channel Setting Pacing Amplitude: 1.5 V
Lead Channel Setting Pacing Amplitude: 2.5 V
Lead Channel Setting Pacing Pulse Width: 0.4 ms
Lead Channel Setting Sensing Sensitivity: 5.6 mV
Zone Setting Status: 755011
Zone Setting Status: 755011

## 2023-04-02 ENCOUNTER — Encounter: Payer: Self-pay | Admitting: Internal Medicine

## 2023-05-01 ENCOUNTER — Ambulatory Visit (HOSPITAL_COMMUNITY)
Admission: RE | Admit: 2023-05-01 | Discharge: 2023-05-01 | Disposition: A | Discharge status: Home / Self Care | Payer: 59 | Source: Ambulatory Visit | Attending: General Practice | Admitting: General Practice

## 2023-05-01 DIAGNOSIS — R0609 Other forms of dyspnea: Secondary | ICD-10-CM | POA: Diagnosis present

## 2023-05-02 LAB — ECHOCARDIOGRAM COMPLETE
AR max vel: 1.44 cm2
AV Area VTI: 1.28 cm2
AV Area mean vel: 1.4 cm2
AV Mean grad: 16.3 mmHg
AV Peak grad: 29.7 mmHg
Ao pk vel: 2.72 m/s
Area-P 1/2: 3.85 cm2
S' Lateral: 3.57 cm

## 2023-05-07 NOTE — Progress Notes (Signed)
 Remote pacemaker transmission.

## 2023-06-03 ENCOUNTER — Other Ambulatory Visit: Payer: Self-pay | Admitting: Cardiology

## 2023-06-12 NOTE — Progress Notes (Deleted)
 Cardiology Clinic Note   Patient Name: [name] Date of Encounter: 06/12/2023  Primary Care Provider:  Wyn Heater, MD Primary Cardiologist:  Alexandria Angel, MD  Patient Profile    [name] 38 year old male presents to the clinic today for follow-up evaluation of his NICM and palpitations.  Past Medical History    Past Medical History:  Diagnosis Date   AKI (acute kidney injury) (HCC)    Aortic valve vegetation on echo 04/29/14 04/29/2014   managed medically   Brain abscess    Chest pain 03/02/2020   Congestive dilated cardiomyopathy (HCC) 06/23/2015   Drug-induced leukopenia (HCC) 06/15/2014   Endocarditis of aortic valve-November 2015    Fracture of left femur (HCC)    History of open heart surgery    December 30, 2013- ascending aortic root replacement, TEE   Lightheaded 03/02/2020   Meningitis    Group B Strep (November 2015)   Migraine headache 10/15/2019   since 2008   Obesity    Patent foramen ovale    Closed during procedure on December 30, 2013   Presence of permanent cardiac pacemaker 01/04/2014   PPM MEDTRONIC FOR COMPLETE HEART BLOCK   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   S/P aortic root and valve allograft 12/30/2013   Human allograft aortic root replacement with repair of aorta-right atrial fistula and tricuspid valve repair   S/P placement of cardiac pacemaker 01/04/2014   Medtronic (serial number [id number]) dual chamber permanent pacemaker implanted by Dr Carolynne Citron   S/P redo patent foramen ovale closure 02/12/2014   S/P redo tricuspid valve repair 02/12/2014   Complex valvuloplasty including patch closure of VSD with plication of septal leaflet and 26 mm Edwards mc3 ring annuloplasty   S/P ventricular septal defect repair + redo tricuspid valve repair + redo closure PFO 02/12/2014   Redo sternotomy for bovine pericardial patch repair of large ventricular septal defect (recurrent LVOT to RA fistula due to bacterial endocarditis)   Smokeless  tobacco use    Third degree heart block (HCC) 12/31/2013   Thrombocytopenia (HCC) 12/2013   Tricuspid regurgitation 02/11/2014   Ventricular septal defect 02/12/2014   Post-infectious s/p repair of LVOT to RA fistula for bacterial endocarditis   Past Surgical History:  Procedure Laterality Date   ASCENDING AORTIC ROOT REPLACEMENT N/A 12/30/2013   Procedure: ASCENDING AORTIC ROOT REPLACEMENT with 23mm HOMOGRAFT, CLOSURE OF AORTIC TO RIGHT ATRIAL FISTULA, CLOSURE OF PATENT FORAMEN OVALE;  Surgeon: Norita Beauvais, MD;  Location: [facility] OR;  Service: Open Heart Surgery;  Laterality: N/A;   INSERT / REPLACE / REMOVE PACEMAKER  01/04/2014   INTRAOPERATIVE TRANSESOPHAGEAL ECHOCARDIOGRAM N/A 12/30/2013   Procedure: INTRAOPERATIVE TRANSESOPHAGEAL ECHOCARDIOGRAM;  Surgeon: Norita Beauvais, MD;  Location: [facility] OR;  Service: Open Heart Surgery;  Laterality: N/A;   PACEMAKER INSERTION Left 04/06/2019   Procedure: PACEMAKER LEAD INSERTION;  Surgeon: Tammie Fall, MD;  Location: [facility] OR;  Service: Cardiovascular;  Laterality: Left;   PACEMAKER LEAD REMOVAL N/A 04/06/2019   Procedure: PACEMAKER LEAD REMOVAL;  Surgeon: Tammie Fall, MD;  Location: [facility] OR;  Service: Cardiovascular;  Laterality: N/A;   PERMANENT PACEMAKER INSERTION N/A 01/04/2014   Procedure: PERMANENT PACEMAKER INSERTION;  Surgeon: Tammie Fall, MD;  Location: [facility] CATH LAB;  Service: Cardiovascular;  Laterality: N/A;   RIGHT HEART CATHETERIZATION N/A 02/12/2014   Procedure: RIGHT HEART CATH;  Surgeon: Darlis Eisenmenger, MD;  Location: [facility] CATH LAB;  Service: Cardiovascular;  Laterality: N/A;   TEE WITHOUT CARDIOVERSION N/A  02/12/2014   Procedure: TRANSESOPHAGEAL ECHOCARDIOGRAM (TEE);  Surgeon: Jacqueline Matsu, MD;  Location: [facility] ENDOSCOPY;  Service: Cardiovascular;  Laterality: N/A;   TEE WITHOUT CARDIOVERSION N/A 04/30/2014   Procedure: TRANSESOPHAGEAL ECHOCARDIOGRAM (TEE);  Surgeon: Hugh Madura, MD;  Location: [facility] ENDOSCOPY;  Service: Cardiovascular;   Laterality: N/A;   TRICUSPID VALVE REPLACEMENT N/A 12/30/2013   Procedure: REPAIR OF TRICUSPID VALVE LEAFLET;  Surgeon: Norita Beauvais, MD;  Location: [facility] OR;  Service: Open Heart Surgery;  Laterality: N/A;   TRICUSPID VALVE REPLACEMENT N/A 02/12/2014   Procedure: TRICUSPID VALVE REPAIR, REDO MEDIAN STERNOTOMY, REPAIR OF VENTRICULAR SEPTAL DEFECT (RECURRENT LV OUTFLOW TRACT TO RIGHT ATRIAL FISTULA), REDO CLOSURE OF PATENT FORAMEN OVALE;  Surgeon: Gardenia Jump, MD;  Location: [facility] OR;  Service: Open Heart Surgery;  Laterality: N/A;   VENOGRAM Left 04/06/2019   Procedure: Venogram;  Surgeon: Tammie Fall, MD;  Location: [facility] OR;  Service: Cardiovascular;  Laterality: Left;    Allergies  Allergies  Allergen Reactions   Cefepime  Other (See Comments)    Leukopenia not clear if due to vancomycin  vs cefepime . WBC dropped down   Vancomycin  Other (See Comments)    ? Drug induced leukopenia vs being due to cefepime . WBC dropped down    History of Present Illness    [name] has a PMH of  aortic valve endocarditis 11/15 status post aortic root replacement/AVR and closure of PFO and TV repair, redo sternotomy 1/16 with repair of VSD, TV, and redo PFO closure, complete heart block postoperative requiring PPM, nonischemic cardiomyopathy with EF of 45-50%, meningitis, and migraines.     He was seen by Dr. Audery Blazing while admitted 11/15 for meningitis due to group B strep.  Need TEE showed normal LV function with a large aortic valve regurgitation and severe AI, large tricuspid valve regurgitation and moderate TR, and a fistula from the aorta to the right atrium.  He became acutely unstable with severe AI had to be intubated.  Cardiothoracic surgery was contacted and he underwent aortic root replacement, AVR, tricuspid valve repair, and closure of PFO by Dr. Waynette Hait 12/30/2013.  During his procedure he was found to have a 3 cm fistula between the LVOT and right atrium.  He developed complete heart block  and underwent PPM placement on 01/04/2014.  He was readmitted on 12/15 after developing acute shortness of breath.  TEE showed a large VSD with left-to-right shunting from the LVOT as well as disruption of the previous surgery.  Fistula and aortic root abscess.  He underwent redo sternotomy with repair of VSD, tricuspid valve repair, redo PFO closure by Dr. Leighton Punches on 12/14/2014.  A new cardiac murmur was heard on 3/16.  Echocardiogram showed aortic vegetation.  He was again admitted he remained afebrile throughout his entire admission.  His blood cultures were were negative.  TEE showed a large mobile irregular bordered echodensity along the anterior aspect of the subclavicular apparatus of the bioprosthetic aortic valve.  He was seen by Dr. Nicanor Barge who recommended against a third redo.  Infectious disease was consulted and IV antibiotics were resumed.   He presented to the emergency department 2/21 with dizziness and dyspnea.  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  He underwent lead extraction 04/06/2019 and a new Medtronic RV lead was placed along with a Medtronic dual-chamber pacemaker.  Due to persistent dizziness his metoprolol  was discontinued.  His device was interrogated in the office after his ED visit for rapid heartbeat and dizziness.  It was felt that his symptoms were lasting much longer than his recorded AT/AF.  Patient was concerned due to his similarity of his previous symptoms which was a diagnosis of endocarditis.  It was discovered that he had not been taking his antibiotics since it was device change.  His repeat labs showed normal CBC and his blood cultures were negative.  And echocardiogram showed an LVEF of 40-45%, mild AI, mild to moderate AAS but no obvious vegetation.  He was restarted on amoxicillin  and advised follow-up with Dr. Fontaine Ice.  He was seen and referred to neurology for follow-up of his dizziness.  This was not felt to be cardiac in nature.   He sent a MyChart message on  03/27/2020 with concerns of low heart rate, feeling lightheaded, and extreme fatigue.  He was worried that his PPM was not working.  Please send a remote transmission and his device was functioning normally.  There were no episodes noted to explain his symptoms.  Contact the office again on 03/29/2020 with concerns about heart rate variability as well as multiple other complaints including several weeks of fatigue, dizziness, shortness of breath, and intermittent chest pain.  He was seen by Michaelle Adolphus, PA-C.  His device was interrogated and showed normal function with no cardiac arrhythmias.  It was noted that his heart rates were in the 90s most the time.  He suggested possible increase in metoprolol .   He reported symptoms of lightheadedness, dizziness, and double vision.  The symptoms had gotten progressively worse and he was referred to neurology.  They recommended a head CT which was unremarkable.  They then recommended MRI brain which could not be completed due to concerns for his pacemaker, and aortic valve mesh.  His symptoms eventually subsided after about 1 month but then returned.  He reported that he woke up at 2 AM and felt his heart racing.  He checked his pulse and it was in the 100s.  He reported dyspnea both at rest and with exertion.  He was seen by pulmonology who ordered PFTs.  He also reported episodes of PND.  He also reported sharp chest pains that would last for seconds and then dissipate.  Hips not felt to be cardiac chest pain.   When he was last seen by Sharren Decree, PA-C 04/01/2020 he reported that he felt like he may be depressed.  He also stated "I know I sound paranoid but I am not" several times.  He reported that he did not want anything overlooked that would be significant to his health.  He reported he had 2 young daughters and he wanted to do everything he could to be around his lungs possible for them.  His anxiety/depression were reviewed.  He was not open to starting any  new medications but was open to talking with someone.   He presented to the clinic 04/11/20 for follow-up evaluation stated he had frustration because he was not able to increase his physical activity as he would want.  He reported that his pacemaker had a high limits at in 150-160 range.  He reported that as soon as he increased his physical activity due to his fitness level he reached a heart rate in the one forties.  He reports some anxiety related to going to the gym due to his lead fracture that he had while he was increasing his physical activity.  He reported he was told by Dr. Carolynne Citron that he may not continue to lift weights.  We  discussed the events surrounding his pacemaker wire lead fracture and it did not seem like he was exercising with great intensity during that time.  I  encouraged him to slowly increase his physical activity as tolerated, continue to monitor his diet, I gave him the mindfulness stress reduction sheet, and planned follow-up as scheduled with Callie Goodrich, PA-C.    He was seen in follow-up by Mertha Abrahams PA-C on 07/05/2022.  He was doing well at that time.  He denied shortness of breath.  He reported that he would like to exercise but worried about lead fracture and device complications.  His device was functioning normally.  No program changes were made.  He was referred to Dr.Schooler to discuss anxiety about health.  03/29/23-For follow-up evaluation and states he has been noticing over the past few weeks that he has not been able to get a deep breath.  He denies sick contacts and other illnesses.  His chest x-ray shows no acute changes.  His lab work has been unremarkable.  He is able to walk at a increased pace without chest discomfort.  His PCP has started him on 10 mg of fluoxetine daily.  He has been taking this for 2 days.  He asks about resistance training.  I have asked him to increase his physical activity as tolerated and be mindful of his pacemaker restrictions.  I  will order an echocardiogram, have him request an early device transmission, and plan follow-up in 3 months.   Today he denies chest pain, shortness of breath, lower extremity edema, fatigue, palpitations, melena, hematuria, hemoptysis, diaphoresis, weakness, presyncope, syncope, orthopnea, and PND.    Home Medications    Prior to Admission medications   Medication Sig Start Date End Date Taking? Authorizing Provider  acetaminophen  (TYLENOL ) 500 MG tablet Take 1,000 mg by mouth every 6 (six) hours as needed for moderate pain or headache.    [provider]  aspirin -acetaminophen -caffeine (EXCEDRIN MIGRAINE) 250-250-65 MG tablet Take by mouth every 6 (six) hours as needed for headache.    [provider]  metoprolol  succinate (TOPROL -XL) 25 MG 24 hr tablet TAKE ONE TABLET BY MOUTH DAILY 06/18/22   Audery Blazing Deannie Fabian, MD    Family History    Family History  Problem Relation Age of Onset   Migraines Mother    COPD Father    Migraines Brother    Heart murmur Brother    Migraines Brother    Migraines Maternal Grandmother    Asthma Neg Hx    He indicated that his mother is alive. He indicated that his father is alive. He indicated that both of his brothers are alive. He indicated that his maternal grandmother is deceased. He indicated that his maternal grandfather is deceased. He indicated that his paternal grandmother is deceased. He indicated that his paternal grandfather is deceased. He indicated that his daughter is alive. He indicated that the status of his neg hx is unknown.  Social History    Social History   Socioeconomic History   Marital status: Married    Spouse name: Sirena   Number of children: 2   Years of education: 12   Highest education level: Some college, no degree  Occupational History   Not on file  Tobacco Use   Smoking status: Never   Smokeless tobacco: Former    Quit date: 12/30/2013  Substance and Sexual Activity   Alcohol use: Yes     Comment: daily   Drug use: No  Sexual activity: Yes    Birth control/protection: Condom  Other Topics Concern   Not on file  Social History Narrative   Lives with wife, children   Caffeine- coffee, 1 c, 2-3 cans soda a day   Social Drivers of Corporate investment banker Strain: Not on file  Food Insecurity: Not on file  Transportation Needs: Not on file  Physical Activity: Not on file  Stress: Not on file  Social Connections: Not on file  Intimate Partner Violence: Not on file     Review of Systems    General:  No chills, fever, night sweats or weight changes.  Cardiovascular:  No chest pain, dyspnea on exertion, edema, orthopnea, palpitations, paroxysmal nocturnal dyspnea. Dermatological: No rash, lesions/masses Respiratory: No cough, dyspnea Urologic: No hematuria, dysuria Abdominal:   No nausea, vomiting, diarrhea, bright red blood per rectum, melena, or hematemesis Neurologic:  No visual changes, wkns, changes in mental status. All other systems reviewed and are otherwise negative except as noted above.  Physical Exam    VS:  There were no vitals taken for this visit. , BMI There is no height or weight on file to calculate BMI. GEN: Well nourished, well developed, in no acute distress. HEENT: normal. Neck: Supple, no JVD, carotid bruits, or masses. Cardiac: RRR, no murmurs, rubs, or gallops. No clubbing, cyanosis, edema.  Radials/DP/PT 2+ and equal bilaterally.  Respiratory:  Respirations regular and unlabored, clear to auscultation bilaterally. GI: Soft, nontender, nondistended, BS + x 4. MS: no deformity or atrophy.  Tenderness to deep palpation along left lateral thoracic area/intercostal space Skin: warm and dry, no rash. Neuro:  Strength and sensation are intact. Psych: Normal affect.  Accessory Clinical Findings    Recent Labs: 03/20/2023: BUN 21; Creatinine, Ser 1.14; Hemoglobin 15.8; Platelets 212; Potassium 4.0; Sodium 137   Recent Lipid Panel     Component Value Date/Time   TRIG 119 12/29/2013 2200    No BP recorded.  {Refresh Note OR Click here to enter BP  :1}***    ECG personally reviewed by me today-none today.    Echocardiogram 07/10/2022  FINDINGS   Left Ventricle: Left ventricular ejection fraction, by estimation, is 55  to 60%. The left ventricle has normal function. The left ventricle has no  regional wall motion abnormalities. The left ventricular internal cavity  size was normal in size. There is   no left ventricular hypertrophy. Abnormal (paradoxical) septal motion,  consistent with RV pacemaker. Left ventricular diastolic parameters were  normal.   Right Ventricle: The right ventricular size is normal. No increase in  right ventricular wall thickness. Right ventricular systolic function is  normal. There is normal pulmonary artery systolic pressure. The tricuspid  regurgitant velocity is 1.72 m/s, and   with an assumed right atrial pressure of 8 mmHg, the estimated right  ventricular systolic pressure is 19.8 mmHg.   Left Atrium: Left atrial size was normal in size.   Right Atrium: Right atrial size was normal in size.   Pericardium: There is no evidence of pericardial effusion.   Mitral Valve: The mitral valve is abnormal. Trivial mitral valve  regurgitation.   Tricuspid Valve: Peak and mean TV Gradients are 9 and 4 mmHg,  respectively. The tricuspid valve is has been repaired/replaced. Tricuspid  valve regurgitation is trivial. Mild tricuspid stenosis. The tricuspid  valve is status post repair with an  annuloplasty ring.   Aortic Valve: The aortic valve has been repaired/replaced. Aortic valve  regurgitation is trivial. Moderate  aortic stenosis is present. Aortic  valve mean gradient measures 24.5 mmHg. Aortic valve peak gradient  measures 44.2 mmHg. There is a homograft  valve present in the aortic position. Procedure Date: 12/30/2013.   Pulmonic Valve: The pulmonic valve was not well  visualized. Pulmonic valve  regurgitation is not visualized.   Aorta: The aortic root and ascending aorta are structurally normal, with  no evidence of dilitation.   Venous: The inferior vena cava is dilated in size with greater than 50%  respiratory variability, suggesting right atrial pressure of 8 mmHg.   IAS/Shunts: No atrial level shunt detected by color flow Doppler.   Echocardiogram 05/01/2023  IMPRESSIONS     1. S/P VSD repair with no evidence of residual shunt by colorflow  doppler. Left ventricular ejection fraction, by estimation, is 60 to 65%.  The left ventricle has normal function. The left ventricle has no regional  wall motion abnormalities. Left  ventricular diastolic parameters are indeterminate.   2. Right ventricular systolic function is mildly reduced. The right  ventricular size is normal. Tricuspid regurgitation signal is inadequate  for assessing PA pressure.   3. The mitral valve is normal in structure. Trivial mitral valve  regurgitation. No evidence of mitral stenosis.   4. The tricuspid valve is has been repaired/replaced. The tricuspid valve  is status post repair with an annuloplasty ring. Mean TV gradient .  tricuspid stenosis.   5. The aortic valve has been repaired/replaced. Aortic valve  regurgitation is trivial. Mild aortic valve stenosis. There is a 23 mm  homograft valve present in the aortic position. Procedure Date:  12/30/2013. Aortic valve area, by VTI measures 1.28 cm.  Aortic valve mean gradient measures 16.3 mmHg. Aortic valve Vmax measures  2.72 m/s and DI 0.46.   6. Aortic root/ascending aorta has been repaired/replaced.   7. The inferior vena cava is dilated in size with >50% respiratory  variability, suggesting right atrial pressure of 8 mmHg.   8. S/P PFO closure and redo with no evidence of residual shunt by  colorflow doppler.   FINDINGS   Left Ventricle: S/P VSD repair with no evidence of residual shunt by  colorflow  doppler. Left ventricular ejection fraction, by estimation, is  55 to 60%. The left ventricle has normal function. The left ventricle has  no regional wall motion  abnormalities. The average left ventricular global longitudinal strain is  -15.1 %. Strain was performed and the global longitudinal strain is  abnormal. The left ventricular internal cavity size was normal in size.  There is no left ventricular  hypertrophy. Abnormal (paradoxical) septal motion, consistent with RV  pacemaker. Left ventricular diastolic parameters are indeterminate. Normal  left ventricular filling pressure.   Right Ventricle: The right ventricular size is normal. No increase in  right ventricular wall thickness. Right ventricular systolic function is  mildly reduced. Tricuspid regurgitation signal is inadequate for assessing  PA pressure.   Left Atrium: Left atrial size was normal in size.   Right Atrium: Right atrial size was normal in size.   Pericardium: There is no evidence of pericardial effusion.   Mitral Valve: The mitral valve is normal in structure. Trivial mitral  valve regurgitation. No evidence of mitral valve stenosis.   Tricuspid Valve: The tricuspid valve is has been repaired/replaced.  Tricuspid valve regurgitation is trivial. Mean TV gradient .  tricuspid stenosis. The tricuspid valve is status post repair with an  annuloplasty ring.   Aortic Valve: The aortic valve has been repaired/replaced.  Aortic valve  regurgitation is trivial. Mild aortic stenosis is present. Aortic valve  mean gradient measures 16.3 mmHg. Aortic valve peak gradient measures 29.7  mmHg. Aortic valve area, by VTI  measures 1.28 cm. There is a 23 mm homograft valve present in the aortic  position. Procedure Date: 12/30/2013.   Pulmonic Valve: The pulmonic valve was normal in structure. Pulmonic valve  regurgitation is not visualized. No evidence of pulmonic stenosis.   Aorta: The aortic root/ascending  aorta has been repaired/replaced.   Venous: The inferior vena cava is dilated in size with greater than 50%  respiratory variability, suggesting right atrial pressure of 8 mmHg.   IAS/Shunts: No atrial level shunt detected by color flow Doppler.   Echocardiogram 05/01/2023  IMPRESSIONS     1. S/P VSD repair with no evidence of residual shunt by colorflow  doppler. Left ventricular ejection fraction, by estimation, is 60 to 65%.  The left ventricle has normal function. The left ventricle has no regional  wall motion abnormalities. Left  ventricular diastolic parameters are indeterminate.   2. Right ventricular systolic function is mildly reduced. The right  ventricular size is normal. Tricuspid regurgitation signal is inadequate  for assessing PA pressure.   3. The mitral valve is normal in structure. Trivial mitral valve  regurgitation. No evidence of mitral stenosis.   4. The tricuspid valve is has been repaired/replaced. The tricuspid valve  is status post repair with an annuloplasty ring. Mean TV gradient .  tricuspid stenosis.   5. The aortic valve has been repaired/replaced. Aortic valve  regurgitation is trivial. Mild aortic valve stenosis. There is a 23 mm  homograft valve present in the aortic position. Procedure Date:  12/30/2013. Aortic valve area, by VTI measures 1.28 cm.  Aortic valve mean gradient measures 16.3 mmHg. Aortic valve Vmax measures  2.72 m/s and DI 0.46.   6. Aortic root/ascending aorta has been repaired/replaced.   7. The inferior vena cava is dilated in size with >50% respiratory  variability, suggesting right atrial pressure of 8 mmHg.   8. S/P PFO closure and redo with no evidence of residual shunt by  colorflow doppler.   FINDINGS   Left Ventricle: S/P VSD repair with no evidence of residual shunt by  colorflow doppler. Left ventricular ejection fraction, by estimation, is  55 to 60%. The left ventricle has normal function. The left ventricle  has  no regional wall motion  abnormalities. The average left ventricular global longitudinal strain is  -15.1 %. Strain was performed and the global longitudinal strain is  abnormal. The left ventricular internal cavity size was normal in size.  There is no left ventricular  hypertrophy. Abnormal (paradoxical) septal motion, consistent with RV  pacemaker. Left ventricular diastolic parameters are indeterminate. Normal  left ventricular filling pressure.   Right Ventricle: The right ventricular size is normal. No increase in  right ventricular wall thickness. Right ventricular systolic function is  mildly reduced. Tricuspid regurgitation signal is inadequate for assessing  PA pressure.   Left Atrium: Left atrial size was normal in size.   Right Atrium: Right atrial size was normal in size.   Pericardium: There is no evidence of pericardial effusion.   Mitral Valve: The mitral valve is normal in structure. Trivial mitral  valve regurgitation. No evidence of mitral valve stenosis.   Tricuspid Valve: The tricuspid valve is has been repaired/replaced.  Tricuspid valve regurgitation is trivial. Mean TV gradient .  tricuspid stenosis. The tricuspid valve is status post repair  with an  annuloplasty ring.   Aortic Valve: The aortic valve has been repaired/replaced. Aortic valve  regurgitation is trivial. Mild aortic stenosis is present. Aortic valve  mean gradient measures 16.3 mmHg. Aortic valve peak gradient measures 29.7  mmHg. Aortic valve area, by VTI  measures 1.28 cm. There is a 23 mm homograft valve present in the aortic  position. Procedure Date: 12/30/2013.   Pulmonic Valve: The pulmonic valve was normal in structure. Pulmonic valve  regurgitation is not visualized. No evidence of pulmonic stenosis.   Aorta: The aortic root/ascending aorta has been repaired/replaced.   Venous: The inferior vena cava is dilated in size with greater than 50%  respiratory variability,  suggesting right atrial pressure of 8 mmHg.   IAS/Shunts: No atrial level shunt detected by color flow Doppler.   Additional Comments: A device lead is visualized.    Assessment & Plan   1.  Nonischemic cardiomyopathy-somewhat physically active.  No increased DOE or new activity intolerance.  Echocardiogram 05/01/2023 showed normal LVEF, intermediate diastolic parameters trivial mitral valve regurgitation, repaired/replaced tricuspid valve with mean TV gradient 6 mmHg.  Repaired/replaced aortic valve with mild aortic valve stenosis and trivial aortic valve regurgitation, aortic valve mean gradient 16.3 mmHg, and aortic root/ascending aorta repaired/replaced.  Status post PFO closure and redo with no evidence of residual shunt. .   Maintain physical activity-May gradually increase physical activity. Continue metoprolol  Heart healthy low-sodium diet  Shortness of breath/DOE-breathing stable.  He previously contacted nurse triage line on 03/20/2023.  Reported 2 days of increased shortness of breath and fatigue.  Does not appear to be related to cardiac issues.  PCP is started him on fluoxetine 10 mg daily.  He has been taking this for 2 days. Continue mindfulness stress reduction  Remote device transmission-03/31/2023 showed appropriate histograms, leads and battery stable.  No changes were recommended.  Palpitations-denies palpitations recently.  Heart rate today 64**bpm.  He is status post Medtronic PPM due to complete heart block.   Following with EP  Anxiety-recently with stable mood and no increased anxiety.  He does note that he worries  about pacemaker leads and pacemaker failin***g.   Patient reassured.  Disposition: Follow-up with Dr. Audery Blazing or me in 9-12 months.   Chet Cota. Mavryk Pino NP-C     06/12/2023, 9:31 AM [facility] [address] Office [phone] Fax [phone]    I spent 13*** minutes examining this patient, reviewing  medications, and using patient centered shared decision making involving their cardiac care.   I spent  20 minutes reviewing past medical history,  medications, and prior cardiac tests.

## 2023-06-13 ENCOUNTER — Ambulatory Visit: Payer: 59 | Admitting: General Practice

## 2023-06-14 ENCOUNTER — Encounter: Payer: Self-pay | Admitting: General Practice

## 2023-07-01 ENCOUNTER — Ambulatory Visit (INDEPENDENT_AMBULATORY_CARE_PROVIDER_SITE_OTHER): Payer: 59

## 2023-07-01 DIAGNOSIS — I442 Atrioventricular block, complete: Secondary | ICD-10-CM

## 2023-07-02 ENCOUNTER — Ambulatory Visit: Payer: Self-pay | Admitting: Internal Medicine

## 2023-07-02 LAB — CUP PACEART REMOTE DEVICE CHECK
Battery Remaining Longevity: 95 mo
Battery Voltage: 2.99 V
Brady Statistic AP VP Percent: 35.18 %
Brady Statistic AP VS Percent: 0 %
Brady Statistic AS VP Percent: 64.81 %
Brady Statistic AS VS Percent: 0.02 %
Brady Statistic RA Percent Paced: 35.15 %
Brady Statistic RV Percent Paced: 99.98 %
Date Time Interrogation Session: 20250518191455
Implantable Lead Connection Status: 753985
Implantable Lead Connection Status: 753985
Implantable Lead Implant Date: 20151123
Implantable Lead Implant Date: 20210222
Implantable Lead Location: 753859
Implantable Lead Location: 753860
Implantable Lead Model: 5076
Implantable Lead Model: 5076
Implantable Pulse Generator Implant Date: 20210222
Lead Channel Impedance Value: 342 Ohm
Lead Channel Impedance Value: 380 Ohm
Lead Channel Impedance Value: 551 Ohm
Lead Channel Impedance Value: 665 Ohm
Lead Channel Pacing Threshold Amplitude: 0.5 V
Lead Channel Pacing Threshold Amplitude: 0.5 V
Lead Channel Pacing Threshold Pulse Width: 0.4 ms
Lead Channel Pacing Threshold Pulse Width: 0.4 ms
Lead Channel Sensing Intrinsic Amplitude: 1.5 mV
Lead Channel Sensing Intrinsic Amplitude: 1.5 mV
Lead Channel Sensing Intrinsic Amplitude: 7.875 mV
Lead Channel Sensing Intrinsic Amplitude: 7.875 mV
Lead Channel Setting Pacing Amplitude: 1.5 V
Lead Channel Setting Pacing Amplitude: 2.5 V
Lead Channel Setting Pacing Pulse Width: 0.4 ms
Lead Channel Setting Sensing Sensitivity: 5.6 mV
Zone Setting Status: 755011
Zone Setting Status: 755011

## 2023-08-14 NOTE — Progress Notes (Signed)
 Remote pacemaker transmission.

## 2023-08-14 NOTE — Addendum Note (Signed)
 Addended by: TAWNI DRILLING D on: 08/14/2023 10:33 AM   Modules accepted: Orders

## 2023-08-28 ENCOUNTER — Encounter: Payer: Self-pay | Admitting: Cardiology

## 2023-09-30 ENCOUNTER — Ambulatory Visit (INDEPENDENT_AMBULATORY_CARE_PROVIDER_SITE_OTHER): Payer: 59

## 2023-09-30 DIAGNOSIS — I442 Atrioventricular block, complete: Secondary | ICD-10-CM | POA: Diagnosis not present

## 2023-10-02 LAB — CUP PACEART REMOTE DEVICE CHECK
Battery Remaining Longevity: 92 mo
Battery Voltage: 2.99 V
Brady Statistic AP VP Percent: 19.69 %
Brady Statistic AP VS Percent: 0 %
Brady Statistic AS VP Percent: 80.27 %
Brady Statistic AS VS Percent: 0.03 %
Brady Statistic RA Percent Paced: 19.67 %
Brady Statistic RV Percent Paced: 99.97 %
Date Time Interrogation Session: 20250817203054
Implantable Lead Connection Status: 753985
Implantable Lead Connection Status: 753985
Implantable Lead Implant Date: 20151123
Implantable Lead Implant Date: 20210222
Implantable Lead Location: 753859
Implantable Lead Location: 753860
Implantable Lead Model: 5076
Implantable Lead Model: 5076
Implantable Pulse Generator Implant Date: 20210222
Lead Channel Impedance Value: 323 Ohm
Lead Channel Impedance Value: 361 Ohm
Lead Channel Impedance Value: 513 Ohm
Lead Channel Impedance Value: 627 Ohm
Lead Channel Pacing Threshold Amplitude: 0.5 V
Lead Channel Pacing Threshold Amplitude: 0.5 V
Lead Channel Pacing Threshold Pulse Width: 0.4 ms
Lead Channel Pacing Threshold Pulse Width: 0.4 ms
Lead Channel Sensing Intrinsic Amplitude: 1.125 mV
Lead Channel Sensing Intrinsic Amplitude: 1.125 mV
Lead Channel Sensing Intrinsic Amplitude: 7.875 mV
Lead Channel Sensing Intrinsic Amplitude: 7.875 mV
Lead Channel Setting Pacing Amplitude: 1.5 V
Lead Channel Setting Pacing Amplitude: 2.5 V
Lead Channel Setting Pacing Pulse Width: 0.4 ms
Lead Channel Setting Sensing Sensitivity: 5.6 mV
Zone Setting Status: 755011
Zone Setting Status: 755011

## 2023-10-06 ENCOUNTER — Ambulatory Visit: Payer: Self-pay | Admitting: Internal Medicine

## 2023-11-03 NOTE — Progress Notes (Signed)
 HPI: FU AVR; initially seen in Nov 2015 with flu that ended up being bacterial endocarditis. He underwent aortic root and valve replacement with fistula repair. At that time he also had CHB and had a MDT PTVDP placed. Jan 2016 he had symptoms of dyspnea and was reevaluated. A TEE was done and and revealed a large VSD with left to right shunting from LVOT. On 02/12/2014  he underwent Redo Median Sternotomy, Repair of a VSD, Tricuspid Valve Repair, and Redo closure of PFO. Patient had a repeat echocardiogram on 04/29/2014 that showed mild global reduction in LV function. There was a mobile mass on the aortic valve concerning for vegetation as well as mild to moderate aortic insufficiency. The patient was admitted and IV antibiotics resumed. Blood cultures remained negative. Transesophageal echocardiogram revealed low normal LV function and an aortic valve mass. 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 Patient was evaluated by Dr. Army and medical therapy recommended. Carotid dopplers 5/23 showed < 50 bilaterally.  Echocardiogram March 2025 showed normal LV function, mild RV dysfunction, status post VSD repair with no residual shunt, status post tricuspid valve repair with mean gradient 6 mmHg, status post aortic valve replacement with mean gradient 16 mmHg and trace aortic insufficiency, status post PFO closure.  Since last seen, there is no dyspnea on exertion, orthopnea, PND, pedal edema, exertional chest pain or syncope.  He is having significant issues with anxiety by his report.  Current Outpatient Medications  Medication Sig Dispense Refill   acetaminophen  (TYLENOL ) 500 MG tablet Take 1,000 mg by mouth every 6 (six) hours as needed for moderate pain or headache.     aspirin -acetaminophen -caffeine (EXCEDRIN MIGRAINE) 250-250-65 MG tablet Take by mouth every 6 (six) hours as needed for headache.     metoprolol  succinate (TOPROL -XL) 25 MG 24 hr tablet TAKE ONE  TABLET BY MOUTH EVERY DAY 90 tablet 2   topiramate  (TOPAMAX ) 25 MG tablet Take 1 tablet (25 mg total) by mouth at bedtime for 7 days, THEN 2 tablets (50 mg total) at bedtime for 21 days. 49 tablet 0   FLUoxetine (PROZAC) 10 MG capsule Take 10 mg by mouth daily. (Patient not taking: Reported on 11/14/2023)     No current facility-administered medications for this visit.     Past Medical History:  Diagnosis Date   AKI (acute kidney injury)    Aortic valve vegetation on echo 04/29/14 04/29/2014   managed medically   Brain abscess    Chest pain 03/02/2020   Congestive dilated cardiomyopathy (HCC) 06/23/2015   Drug-induced leukopenia 06/15/2014   Endocarditis of aortic valve-November 2015    Fracture of left femur (HCC)    History of open heart surgery    December 30, 2013- ascending aortic root replacement, TEE   Lightheaded 03/02/2020   Meningitis    Group B Strep (November 2015)   Migraine headache 10/15/2019   since 2008   Obesity    Patent foramen ovale    Closed during procedure on December 30, 2013   Presence of permanent cardiac pacemaker 01/04/2014   PPM MEDTRONIC FOR COMPLETE HEART BLOCK   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   S/P aortic root and valve allograft 12/30/2013   Human allograft aortic root replacement with repair of aorta-right atrial fistula and tricuspid valve repair   S/P placement of cardiac pacemaker 01/04/2014   Medtronic (serial number [id number]) dual chamber permanent pacemaker implanted by Dr Waddell   S/P redo patent foramen ovale closure 02/12/2014  S/P redo tricuspid valve repair 02/12/2014   Complex valvuloplasty including patch closure of VSD with plication of septal leaflet and 26 mm Edwards mc3 ring annuloplasty   S/P ventricular septal defect repair + redo tricuspid valve repair + redo closure PFO 02/12/2014   Redo sternotomy for bovine pericardial patch repair of large ventricular septal defect (recurrent LVOT to RA fistula due to bacterial  endocarditis)   Smokeless tobacco use    Third degree heart block (HCC) 12/31/2013   Thrombocytopenia 12/2013   Tricuspid regurgitation 02/11/2014   Ventricular septal defect 02/12/2014   Post-infectious s/p repair of LVOT to RA fistula for bacterial endocarditis    Past Surgical History:  Procedure Laterality Date   ASCENDING AORTIC ROOT REPLACEMENT N/A 12/30/2013   Procedure: ASCENDING AORTIC ROOT REPLACEMENT with 23mm HOMOGRAFT, CLOSURE OF AORTIC TO RIGHT ATRIAL FISTULA, CLOSURE OF PATENT FORAMEN OVALE;  Surgeon: Dallas KATHEE Jude, MD;  Location: [facility] OR;  Service: Open Heart Surgery;  Laterality: N/A;   INSERT / REPLACE / REMOVE PACEMAKER  01/04/2014   INTRAOPERATIVE TRANSESOPHAGEAL ECHOCARDIOGRAM N/A 12/30/2013   Procedure: INTRAOPERATIVE TRANSESOPHAGEAL ECHOCARDIOGRAM;  Surgeon: Dallas KATHEE Jude, MD;  Location: [facility] OR;  Service: Open Heart Surgery;  Laterality: N/A;   PACEMAKER INSERTION Left 04/06/2019   Procedure: PACEMAKER LEAD INSERTION;  Surgeon: Waddell Danelle ORN, MD;  Location: [facility] OR;  Service: Cardiovascular;  Laterality: Left;   PACEMAKER LEAD REMOVAL N/A 04/06/2019   Procedure: PACEMAKER LEAD REMOVAL;  Surgeon: Waddell Danelle ORN, MD;  Location: [facility] OR;  Service: Cardiovascular;  Laterality: N/A;   PERMANENT PACEMAKER INSERTION N/A 01/04/2014   Procedure: PERMANENT PACEMAKER INSERTION;  Surgeon: Danelle ORN Waddell, MD;  Location: [facility] CATH LAB;  Service: Cardiovascular;  Laterality: N/A;   RIGHT HEART CATHETERIZATION N/A 02/12/2014   Procedure: RIGHT HEART CATH;  Surgeon: Ezra GORMAN Shuck, MD;  Location: [facility] CATH LAB;  Service: Cardiovascular;  Laterality: N/A;   TEE WITHOUT CARDIOVERSION N/A 02/12/2014   Procedure: TRANSESOPHAGEAL ECHOCARDIOGRAM (TEE);  Surgeon: Wilbert JONELLE Bihari, MD;  Location: [facility] ENDOSCOPY;  Service: Cardiovascular;  Laterality: N/A;   TEE WITHOUT CARDIOVERSION N/A 04/30/2014   Procedure: TRANSESOPHAGEAL ECHOCARDIOGRAM (TEE);  Surgeon: Oneil JAYSON Parchment, MD;  Location: [facility] ENDOSCOPY;   Service: Cardiovascular;  Laterality: N/A;   TRICUSPID VALVE REPLACEMENT N/A 12/30/2013   Procedure: REPAIR OF TRICUSPID VALVE LEAFLET;  Surgeon: Dallas KATHEE Jude, MD;  Location: [facility] OR;  Service: Open Heart Surgery;  Laterality: N/A;   TRICUSPID VALVE REPLACEMENT N/A 02/12/2014   Procedure: TRICUSPID VALVE REPAIR, REDO MEDIAN STERNOTOMY, REPAIR OF VENTRICULAR SEPTAL DEFECT (RECURRENT LV OUTFLOW TRACT TO RIGHT ATRIAL FISTULA), REDO CLOSURE OF PATENT FORAMEN OVALE;  Surgeon: Sudie VEAR Laine, MD;  Location: [facility] OR;  Service: Open Heart Surgery;  Laterality: N/A;   VENOGRAM Left 04/06/2019   Procedure: Venogram;  Surgeon: Waddell Danelle ORN, MD;  Location[facility] OR;  Service: Cardiovascular;  Laterality: Left;    Social History   Socioeconomic History   Marital status: Married    Spouse name: Sirena   Number of children: 2   Years of education: 12   Highest education level: Some college, no degree  Occupational History   Not on file  Tobacco Use   Smoking status: Never   Smokeless tobacco: Former    Quit date: 12/30/2013  Substance and Sexual Activity   Alcohol use: Yes    Comment: daily   Drug use: No   Sexual activity: Yes    Birth control/protection: Condom  Other Topics Concern  Not on file  Social History Narrative   Lives with wife, children   Caffeine- coffee, 1 c, 2-3 cans soda a day   Social Drivers of Corporate investment banker Strain: Not on file  Food Insecurity: Not on file  Transportation Needs: Not on file  Physical Activity: Not on file  Stress: Not on file  Social Connections: Not on file  Intimate Partner Violence: Not on file    Family History  Problem Relation Age of Onset   Migraines Mother    COPD Father    Migraines Brother    Heart murmur Brother    Migraines Brother    Migraines Maternal Grandmother    Asthma Neg Hx     ROS: no fevers or chills, productive cough, hemoptysis, dysphasia, odynophagia, melena, hematochezia, dysuria, hematuria, rash,  seizure activity, orthopnea, PND, pedal edema, claudication. Remaining systems are negative.  Physical Exam: Well-developed well-nourished in no acute distress.  Skin is warm and dry.  HEENT is normal.  Neck is supple.  Chest is clear to auscultation with normal expansion.  Cardiovascular exam is regular rate and rhythm.  2/6 systolic murmur left sternal border. Abdominal exam nontender or distended. No masses palpated. Extremities show no edema. neuro grossly intact  A/P  1 history of aortic valve replacement/aortic root replacement-most recent echocardiogram showed mildly elevated mean gradient of 16 mmHg and trace aortic insufficiency; will repeat echocardiogram March 2026.  Continue SBE prophylaxis.  2 history of VSD repair/PFO closure/tricuspid valve repair-continue SBE prophylaxis.  3 status post pacemaker-managed by electrophysiology.  4 history of mild LV dysfunction-LV function is normal on most recent echocardiogram.  Continue Toprol  but will decrease dose to 12.5 mg daily as his blood pressure is borderline.  5 anxiety-follow-up primary care.  Likely needs psychiatry assistance.  Redell Shallow, MD

## 2023-11-06 NOTE — Progress Notes (Signed)
 Remote PPM Transmission

## 2023-11-08 ENCOUNTER — Emergency Department (HOSPITAL_COMMUNITY)

## 2023-11-08 ENCOUNTER — Encounter (HOSPITAL_COMMUNITY): Payer: Self-pay | Admitting: *Deleted

## 2023-11-08 ENCOUNTER — Other Ambulatory Visit: Payer: Self-pay

## 2023-11-08 ENCOUNTER — Telehealth: Payer: Self-pay | Admitting: Cardiology

## 2023-11-08 ENCOUNTER — Emergency Department (HOSPITAL_COMMUNITY)
Admission: EM | Admit: 2023-11-08 | Discharge: 2023-11-08 | Disposition: A | Discharge status: Home / Self Care | Attending: Emergency Medicine | Admitting: Emergency Medicine

## 2023-11-08 DIAGNOSIS — R42 Dizziness and giddiness: Secondary | ICD-10-CM | POA: Insufficient documentation

## 2023-11-08 DIAGNOSIS — Z7982 Long term (current) use of aspirin: Secondary | ICD-10-CM | POA: Insufficient documentation

## 2023-11-08 LAB — CBC WITH DIFFERENTIAL/PLATELET
Abs Immature Granulocytes: 0.04 K/uL (ref 0.00–0.07)
Basophils Absolute: 0.1 K/uL (ref 0.0–0.1)
Basophils Relative: 1 %
Eosinophils Absolute: 0.4 K/uL (ref 0.0–0.5)
Eosinophils Relative: 5 %
HCT: 47.9 % (ref 39.0–52.0)
Hemoglobin: 16.2 g/dL (ref 13.0–17.0)
Immature Granulocytes: 1 %
Lymphocytes Relative: 15 %
Lymphs Abs: 1.3 K/uL (ref 0.7–4.0)
MCH: 30.7 pg (ref 26.0–34.0)
MCHC: 33.8 g/dL (ref 30.0–36.0)
MCV: 90.7 fL (ref 80.0–100.0)
Monocytes Absolute: 0.9 K/uL (ref 0.1–1.0)
Monocytes Relative: 10 %
Neutro Abs: 5.8 K/uL (ref 1.7–7.7)
Neutrophils Relative %: 68 %
Platelets: 219 K/uL (ref 150–400)
RBC: 5.28 MIL/uL (ref 4.22–5.81)
RDW: 13.6 % (ref 11.5–15.5)
WBC: 8.4 K/uL (ref 4.0–10.5)
nRBC: 0 % (ref 0.0–0.2)

## 2023-11-08 LAB — BASIC METABOLIC PANEL WITH GFR
Anion gap: 8 (ref 5–15)
BUN: 11 mg/dL (ref 6–20)
CO2: 24 mmol/L (ref 22–32)
Calcium: 9 mg/dL (ref 8.9–10.3)
Chloride: 106 mmol/L (ref 98–111)
Creatinine, Ser: 1.14 mg/dL (ref 0.61–1.24)
GFR, Estimated: >60 mL/min (ref >60)
Glucose, Bld: 93 mg/dL (ref 70–99)
Potassium: 4.5 mmol/L (ref 3.5–5.1)
Sodium: 138 mmol/L (ref 135–145)

## 2023-11-08 LAB — TROPONIN I (HIGH SENSITIVITY)
Troponin I (High Sensitivity): 3 ng/L (ref <18)
Troponin I (High Sensitivity): 4 ng/L (ref <18)

## 2023-11-08 MED ORDER — TOPIRAMATE 25 MG PO TABS: ORAL_TABLET | ORAL | 0 refills | Status: AC

## 2023-11-08 NOTE — ED Provider Notes (Signed)
 [place] EMERGENCY DEPARTMENT AT [facility] Provider Note   CSN: [id number] Arrival date & time: 11/08/23  1505     Patient presents with: Dizziness   [name] is a 38 y.o. male presenting to the ED with complaint of dizzy spells.  Patient ports been ongoing for 5 or 6 years since he was diagnosed and treated for bacterial meningitis.  This was complicated by endocarditis and heart block for which she had a pacemaker and a valve replacement.  He is not on anticoagulation.  He said he had frequent migraine headaches preceding that and continued to have auras after his meningitis, but has not had persistent headaches since then.  He does not have headaches associated lightheadedness.  But the spells will occur at random, lasting up to 2 weeks, not associated with activity.  He describes it as a sensation that the light is hyper bright like the TV was over saturated, and he feels lightheaded and dizzy.  No loss of consciousness.  He has had extensive cardiac workup for the spells and says that no arrhythmias or incidents were found to be related to his heart.   HPI     Prior to Admission medications   Medication Sig Start Date End Date Taking? Authorizing Provider  topiramate  (TOPAMAX ) 25 MG tablet Take 1 tablet (25 mg total) by mouth at bedtime for 7 days, THEN 2 tablets (50 mg total) at bedtime for 21 days. 11/08/23 12/06/23 Yes Agam Tuohy, Donnice PARAS, MD  acetaminophen  (TYLENOL ) 500 MG tablet Take 1,000 mg by mouth every 6 (six) hours as needed for moderate pain or headache.    [provider]  aspirin -acetaminophen -caffeine (EXCEDRIN MIGRAINE) 250-250-65 MG tablet Take by mouth every 6 (six) hours as needed for headache.    [provider]  FLUoxetine (PROZAC) 10 MG capsule Take 10 mg by mouth daily.    [provider]  metoprolol  succinate (TOPROL -XL) 25 MG 24 hr tablet TAKE ONE TABLET BY MOUTH EVERY DAY 06/03/23   Pietro Redell RAMAN, MD    Allergies:  Cefepime  and Vancomycin     Review of Systems  Updated Vital Signs BP 103/71   Pulse 78   Temp 97.9 F (36.6 C) (Oral)   Resp 16   Ht 5' 5 (1.651 m)   Wt 104.9 kg   SpO2 98%   BMI 38.48 kg/m   Physical Exam Constitutional:      General: He is not in acute distress. HENT:     Head: Normocephalic and atraumatic.  Eyes:     Conjunctiva/sclera: Conjunctivae normal.     Pupils: Pupils are equal, round, and reactive to light.  Cardiovascular:     Rate and Rhythm: Normal rate and regular rhythm.     Heart sounds: Murmur heard.  Pulmonary:     Effort: Pulmonary effort is normal. No respiratory distress.  Abdominal:     General: There is no distension.     Tenderness: There is no abdominal tenderness.  Skin:    General: Skin is warm and dry.  Neurological:     General: No focal deficit present.     Mental Status: He is alert. Mental status is at baseline.  Psychiatric:        Mood and Affect: Mood normal.        Behavior: Behavior normal.     (all labs ordered are listed, but only abnormal results are displayed) Labs Reviewed  BASIC METABOLIC PANEL WITH GFR  CBC WITH DIFFERENTIAL/PLATELET  TROPONIN  I (HIGH SENSITIVITY)  TROPONIN I (HIGH SENSITIVITY)    EKG: EKG Interpretation Date/Time:  Friday November 08 2023 15:54:42 EDT Ventricular Rate:  70 PR Interval:  176 QRS Duration:  164 QT Interval:  416 QTC Calculation: 449 R Axis:   -88  Text Interpretation: Atrial-sensed ventricular-paced rhythm Abnormal ECG When compared with ECG of 20-Mar-2023 15:36, PREVIOUS ECG IS PRESENT Confirmed by Cottie Cough [phone]) on 11/08/2023 5:08:50 PM  Radiology: [facility] Chest 2 View Result Date: 11/08/2023 CLINICAL DATA:  dizziness EXAM: CHEST - 2 VIEW COMPARISON:  03/20/2023 FINDINGS: No focal airspace consolidation, pleural effusion, or pneumothorax. No cardiomegaly.Sternotomy wires. Left chest pacemaker with leads terminating in the right atrium right ventricle.No acute fracture  or destructive lesion. IMPRESSION: No acute cardiopulmonary abnormality. Electronically Signed   By: Rogelia Myers M.D.   On: 11/08/2023 16:37     Procedures   Medications Ordered in the ED - No data to display                                  Medical Decision Making Risk Prescription drug management.   Patient is here with dizzy spells on and off.  This may include a differential of arrhythmias verse anemia versus metabolic derangement versus complex migraines versus other  I did review his external records and he has had an extensive cardiac workup for this issue.  He does not appear to have arrhythmia problems and is in a paced rhythm here in the ED.  He is on a low-dose metoprolol  chronically but I do not suspect that this would cause intermittent dizziness, particularly at rest as he describes it.  I think more likely this may be a complex migraine.  I did discuss this possibility with the neurologist by phone, Dr. Vanessa, who reported this is a possibility, especially after her meningitis infection.  We discussed with the patient starting him on Topamax  as migraine preventative medicine, and a taper 25 mg at night for the first week and then 50 mg.  He will also follow-up again with a neurologist whom he has seen in the past, 3 years ago, for headaches.  The patient is quite comfortable with this plan.  I did review personally his workup here with no emergent findings.  This includes his EKG and chest x-ray and blood test.  Low suspicion for sepsis, meningitis, or life-threatening medical emergency.  I think he is stable for discharge.     Final diagnoses:  Dizziness    ED Discharge Orders          Ordered    topiramate  (TOPAMAX ) 25 MG tablet  QHS        11/08/23 2025               Cottie Cough PARAS, MD 11/08/23 2028

## 2023-11-08 NOTE — ED Provider Triage Note (Signed)
 Emergency Medicine Provider Triage Evaluation Note  Rorey Bisson , a 38 y.o. male  was evaluated in triage.  Pt complains of dizziness that has been on and off for several years that has acutely worsened to where he had to leave work last night because he felt unsafe driving.  He has an extensive cardiac history and a Medtronic pacemaker. He describes the dizziness and feeling off-balance, he endorses photophobia but denies headache. 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   Review of Systems  Positive:  Negative:   Physical Exam  BP 103/61   Pulse 75   Temp (!) 97 F (36.1 C) (Axillary)   Resp 18   Ht 5' 5 (1.651 m)   Wt 104.9 kg   SpO2 97%   BMI 38.48 kg/m  Gen:   Awake, no distress   Resp:  Normal effort, clear MSK:   Moves extremities without difficulty.  Other:  No nystagmus  Medical Decision Making  Medically screening exam initiated at 3:53 PM.  Appropriate orders placed.  Zaydyn Havey was informed that the remainder of the evaluation will be completed by another provider, this initial triage assessment does not replace that evaluation, and the importance of remaining in the ED until their evaluation is complete.    Torrence Marry RAMAN, [place] 11/08/23 1557

## 2023-11-08 NOTE — Telephone Encounter (Signed)
 STAT if patient feels like he/she is going to faint   1. Are you feeling dizzy, lightheaded, or faint right now?  Yes  2. Have you passed out?    No  (If yes move to .SYNCOPECHMG)  3. Do you have any other symptoms?   Patient stated his head feels heavy  4. Have you checked your HR and BP (record if available)?   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

## 2023-11-08 NOTE — ED Triage Notes (Signed)
 The pt is c/o dizziness son and lt arm pain  he has an extensive history of cardiac problems  he also has a pacemaker medtronic

## 2023-11-08 NOTE — Telephone Encounter (Signed)
 Called pt reports is currently at the ED.  Pt will be assessed no further assistance needed at this time.

## 2023-11-14 ENCOUNTER — Encounter: Payer: Self-pay | Admitting: Cardiology

## 2023-11-14 ENCOUNTER — Ambulatory Visit: Attending: Cardiology | Admitting: Cardiology

## 2023-11-14 VITALS — BP 91/65 | HR 80 | Ht 65.0 in | Wt 225.8 lb

## 2023-11-14 DIAGNOSIS — Z952 Presence of prosthetic heart valve: Secondary | ICD-10-CM | POA: Diagnosis not present

## 2023-11-14 DIAGNOSIS — Z950 Presence of cardiac pacemaker: Secondary | ICD-10-CM

## 2023-11-14 DIAGNOSIS — Z9889 Other specified postprocedural states: Secondary | ICD-10-CM | POA: Diagnosis not present

## 2023-11-14 MED ORDER — METOPROLOL SUCCINATE ER 25 MG PO TB24: 12.5000 mg | ORAL_TABLET | Freq: Every day | ORAL | Status: DC

## 2023-11-14 NOTE — Patient Instructions (Signed)
 Medication Instructions:   DRCREASE METOPROLOL  TO 12.5 MG ONCE DAILY=1/2 OF THE 25 MG TABLET ONCE DAILY  *If you need a refill on your cardiac medications before your next appointment, please call your pharmacy*   Testing/Procedures:  Your physician has requested that you have an echocardiogram. Echocardiography is a painless test that uses sound waves to create images of your heart. It provides your doctor with information about the size and shape of your heart and how well your heart's chambers and valves are working. This procedure takes approximately one hour. There are no restrictions for this procedure. Please do NOT wear cologne, perfume, aftershave, or lotions (deodorant is allowed). Please arrive 15 minutes prior to your appointment time.  Please note: We ask at that you not bring children with you during ultrasound (echo/ vascular) testing. Due to room size and safety concerns, children are not allowed in the ultrasound rooms during exams. Our front office staff cannot provide observation of children in our lobby area while testing is being conducted. An adult accompanying a patient to their appointment will only be allowed in the ultrasound room at the discretion of the ultrasound technician under special circumstances. We apologize for any inconvenience. [address]-SCHEDU[address] IN MARCH  Follow-Up: At [facility], you and your health needs are our priority.  As part of our continuing mission to provide you with exceptional heart care, our providers are all part of one team.  This team includes your primary Cardiologist (physician) and Advanced Practice Providers or APPs (Physician Assistants and Nurse Practitioners) who all work together to provide you with the care you need, when you need it.  Your next appointment:   12 month(s)  Provider:   Redell Shallow, MD    Other Instructions SCHEDULE FOLLOW UP WITH DR WADDELL PER RECALL

## 2023-11-25 ENCOUNTER — Encounter: Payer: Self-pay | Admitting: Cardiology

## 2023-12-30 ENCOUNTER — Ambulatory Visit (INDEPENDENT_AMBULATORY_CARE_PROVIDER_SITE_OTHER): Payer: 59

## 2023-12-30 DIAGNOSIS — R002 Palpitations: Secondary | ICD-10-CM

## 2023-12-30 LAB — CUP PACEART REMOTE DEVICE CHECK
Battery Remaining Longevity: 87 mo
Battery Voltage: 2.99 V
Brady Statistic AP VP Percent: 3.1 %
Brady Statistic AP VS Percent: 0 %
Brady Statistic AS VP Percent: 96.85 %
Brady Statistic AS VS Percent: 0.05 %
Brady Statistic RA Percent Paced: 3.1 %
Brady Statistic RV Percent Paced: 99.95 %
Date Time Interrogation Session: 20251116194830
Implantable Lead Connection Status: 753985
Implantable Lead Connection Status: 753985
Implantable Lead Implant Date: 20151123
Implantable Lead Implant Date: 20210222
Implantable Lead Location: 753859
Implantable Lead Location: 753860
Implantable Lead Model: 5076
Implantable Lead Model: 5076
Implantable Pulse Generator Implant Date: 20210222
Lead Channel Impedance Value: 304 Ohm
Lead Channel Impedance Value: 361 Ohm
Lead Channel Impedance Value: 456 Ohm
Lead Channel Impedance Value: 570 Ohm
Lead Channel Pacing Threshold Amplitude: 0.5 V
Lead Channel Pacing Threshold Amplitude: 0.5 V
Lead Channel Pacing Threshold Pulse Width: 0.4 ms
Lead Channel Pacing Threshold Pulse Width: 0.4 ms
Lead Channel Sensing Intrinsic Amplitude: 1.375 mV
Lead Channel Sensing Intrinsic Amplitude: 1.375 mV
Lead Channel Sensing Intrinsic Amplitude: 7.875 mV
Lead Channel Sensing Intrinsic Amplitude: 7.875 mV
Lead Channel Setting Pacing Amplitude: 1.5 V
Lead Channel Setting Pacing Amplitude: 2.5 V
Lead Channel Setting Pacing Pulse Width: 0.4 ms
Lead Channel Setting Sensing Sensitivity: 5.6 mV
Zone Setting Status: 755011
Zone Setting Status: 755011

## 2024-01-01 ENCOUNTER — Ambulatory Visit: Payer: Self-pay | Admitting: Internal Medicine

## 2024-01-01 NOTE — Progress Notes (Signed)
 Remote PPM Transmission

## 2024-02-26 ENCOUNTER — Other Ambulatory Visit: Payer: Self-pay | Admitting: Cardiology

## 2024-03-18 ENCOUNTER — Encounter: Payer: Self-pay | Admitting: Cardiology

## 2024-03-18 ENCOUNTER — Telehealth: Payer: Self-pay | Admitting: Cardiology

## 2024-03-18 MED ORDER — AMOXICILLIN 500 MG PO TABS: 2000.0000 mg | ORAL_TABLET | Freq: Once | ORAL | 0 refills | Status: AC

## 2024-03-18 NOTE — Telephone Encounter (Signed)
 Pt c/o medication issue:  1. Name of Medication:   2. How are you currently taking this medication (dosage and times per day)?   3. Are you having a reaction (difficulty breathing--STAT)?    4. What is your medication issue? Pt is going to dentist tomorrow for a cleaning and needs antibiotics called in to   [facility] [place], [place] - [address] Phone: [phone]  Fax: [phone]

## 2024-03-18 NOTE — Telephone Encounter (Signed)
Spoke with pt, Refill sent to the pharmacy electronically.  

## 2024-03-18 NOTE — Telephone Encounter (Signed)
 Patient requests amoxicillin  refill for SBE prophylaxis but med is listed as discontinued as of 2024. Forwarded to Dr. Pietro for advice.

## 2024-03-30 ENCOUNTER — Encounter

## 2024-04-15 ENCOUNTER — Other Ambulatory Visit (HOSPITAL_COMMUNITY)

## 2024-06-29 ENCOUNTER — Encounter

## 2024-09-28 ENCOUNTER — Encounter

## 2024-12-28 ENCOUNTER — Encounter

## 2025-03-29 ENCOUNTER — Encounter

## 2025-06-28 ENCOUNTER — Encounter
